# Patient Record
Sex: Female | Born: 1965
Health system: Southern US, Community
[De-identification: ages and names within clinical notes are randomized; demographics above are authoritative.]

## PROBLEM LIST (undated history)

## (undated) DIAGNOSIS — J45909 Unspecified asthma, uncomplicated: Secondary | ICD-10-CM

## (undated) DIAGNOSIS — H9190 Unspecified hearing loss, unspecified ear: Secondary | ICD-10-CM

## (undated) DIAGNOSIS — G4736 Sleep related hypoventilation in conditions classified elsewhere: Secondary | ICD-10-CM

## (undated) DIAGNOSIS — G4730 Sleep apnea, unspecified: Secondary | ICD-10-CM

## (undated) DIAGNOSIS — E785 Hyperlipidemia, unspecified: Secondary | ICD-10-CM

## (undated) DIAGNOSIS — M1990 Unspecified osteoarthritis, unspecified site: Secondary | ICD-10-CM

## (undated) DIAGNOSIS — F341 Dysthymic disorder: Secondary | ICD-10-CM

## (undated) DIAGNOSIS — R350 Frequency of micturition: Secondary | ICD-10-CM

## (undated) DIAGNOSIS — E039 Hypothyroidism, unspecified: Secondary | ICD-10-CM

## (undated) DIAGNOSIS — I509 Heart failure, unspecified: Secondary | ICD-10-CM

## (undated) DIAGNOSIS — D751 Secondary polycythemia: Secondary | ICD-10-CM

## (undated) DIAGNOSIS — F419 Anxiety disorder, unspecified: Secondary | ICD-10-CM

## (undated) DIAGNOSIS — F329 Major depressive disorder, single episode, unspecified: Secondary | ICD-10-CM

## (undated) DIAGNOSIS — E119 Type 2 diabetes mellitus without complications: Secondary | ICD-10-CM

## (undated) DIAGNOSIS — J189 Pneumonia, unspecified organism: Secondary | ICD-10-CM

## (undated) DIAGNOSIS — I10 Essential (primary) hypertension: Secondary | ICD-10-CM

## (undated) DIAGNOSIS — R0600 Dyspnea, unspecified: Secondary | ICD-10-CM

## (undated) DIAGNOSIS — Z0000 Encounter for general adult medical examination without abnormal findings: Secondary | ICD-10-CM

## (undated) DIAGNOSIS — G709 Myoneural disorder, unspecified: Secondary | ICD-10-CM

## (undated) DIAGNOSIS — I872 Venous insufficiency (chronic) (peripheral): Secondary | ICD-10-CM

## (undated) DIAGNOSIS — J449 Chronic obstructive pulmonary disease, unspecified: Secondary | ICD-10-CM

## (undated) DIAGNOSIS — R0902 Hypoxemia: Secondary | ICD-10-CM

## (undated) DIAGNOSIS — G629 Polyneuropathy, unspecified: Secondary | ICD-10-CM

## (undated) HISTORY — DX: Essential (primary) hypertension: I10

## (undated) HISTORY — DX: Sleep related hypoventilation in conditions classified elsewhere: G47.36

## (undated) HISTORY — DX: Hypoxemia: R09.02

## (undated) HISTORY — PX: TONSILLECTOMY: SUR1361

## (undated) HISTORY — DX: Pneumonia, unspecified organism: J18.9

## (undated) HISTORY — DX: Hyperlipidemia, unspecified: E78.5

## (undated) HISTORY — DX: Venous insufficiency (chronic) (peripheral): I87.2

## (undated) HISTORY — DX: Major depressive disorder, single episode, unspecified: F32.9

## (undated) HISTORY — DX: Frequency of micturition: R35.0

## (undated) HISTORY — DX: Encounter for general adult medical examination without abnormal findings: Z00.00

## (undated) HISTORY — DX: Secondary polycythemia: D75.1

## (undated) HISTORY — DX: Chronic obstructive pulmonary disease, unspecified: J44.9

## (undated) HISTORY — DX: Hypothyroidism, unspecified: E03.9

## (undated) HISTORY — DX: Myoneural disorder, unspecified: G70.9

## (undated) HISTORY — DX: Dysthymic disorder: F34.1

---
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
## 2005-04-29 ENCOUNTER — Ambulatory Visit: Payer: Self-pay | Admitting: Internal Medicine

## 2005-09-04 ENCOUNTER — Ambulatory Visit: Payer: Self-pay | Admitting: Internal Medicine

## 2006-06-25 ENCOUNTER — Ambulatory Visit: Payer: Self-pay | Admitting: Internal Medicine

## 2007-07-26 ENCOUNTER — Encounter: Payer: Self-pay | Admitting: Internal Medicine

## 2007-10-21 ENCOUNTER — Encounter: Payer: Self-pay | Admitting: Internal Medicine

## 2007-11-16 ENCOUNTER — Ambulatory Visit: Payer: Self-pay | Admitting: Internal Medicine

## 2007-11-16 LAB — CONVERTED CEMR LAB
Albumin: 3.6 g/dL (ref 3.5–5.2)
BUN: 8 mg/dL (ref 6–23)
Basophils Absolute: 0 10*3/uL (ref 0.0–0.1)
Basophils Relative: 0.2 % (ref 0.0–1.0)
Bilirubin Urine: NEGATIVE
Bilirubin, Direct: 0.1 mg/dL (ref 0.0–0.3)
Creatinine, Ser: 0.6 mg/dL (ref 0.4–1.2)
Eosinophils Absolute: 0.2 10*3/uL (ref 0.0–0.7)
Eosinophils Relative: 1.7 % (ref 0.0–5.0)
HCT: 45.9 % (ref 36.0–46.0)
HDL: 23.7 mg/dL — ABNORMAL LOW (ref >39.0)
Lymphocytes Relative: 16 % (ref 12.0–46.0)
MCV: 90.6 fL (ref 78.0–100.0)
Monocytes Absolute: 0.6 10*3/uL (ref 0.1–1.0)
Monocytes Relative: 5.9 % (ref 3.0–12.0)
Neutrophils Relative %: 76.2 % (ref 43.0–77.0)
Nitrite: NEGATIVE
Platelets: 186 10*3/uL (ref 150–400)
RDW: 12.9 % (ref 11.5–14.6)
TSH: 5.42 microintl units/mL (ref 0.35–5.50)
Total Protein: 7.5 g/dL (ref 6.0–8.3)
Urobilinogen, UA: 1 (ref 0.0–1.0)
pH: 7 (ref 5.0–8.0)

## 2007-11-19 ENCOUNTER — Ambulatory Visit: Payer: Self-pay | Admitting: Internal Medicine

## 2007-11-19 DIAGNOSIS — E785 Hyperlipidemia, unspecified: Secondary | ICD-10-CM

## 2007-11-19 DIAGNOSIS — F32A Depression, unspecified: Secondary | ICD-10-CM | POA: Insufficient documentation

## 2007-11-19 DIAGNOSIS — F411 Generalized anxiety disorder: Secondary | ICD-10-CM | POA: Insufficient documentation

## 2007-11-19 DIAGNOSIS — F3289 Other specified depressive episodes: Secondary | ICD-10-CM

## 2007-11-19 DIAGNOSIS — F329 Major depressive disorder, single episode, unspecified: Secondary | ICD-10-CM

## 2007-11-19 DIAGNOSIS — I10 Essential (primary) hypertension: Secondary | ICD-10-CM | POA: Insufficient documentation

## 2007-11-19 DIAGNOSIS — E039 Hypothyroidism, unspecified: Secondary | ICD-10-CM | POA: Insufficient documentation

## 2007-11-19 HISTORY — DX: Hypothyroidism, unspecified: E03.9

## 2007-11-19 HISTORY — DX: Essential (primary) hypertension: I10

## 2007-11-19 HISTORY — DX: Major depressive disorder, single episode, unspecified: F32.9

## 2007-11-19 HISTORY — DX: Other specified depressive episodes: F32.89

## 2007-11-19 HISTORY — DX: Hyperlipidemia, unspecified: E78.5

## 2008-12-29 ENCOUNTER — Telehealth (INDEPENDENT_AMBULATORY_CARE_PROVIDER_SITE_OTHER): Payer: Self-pay | Admitting: *Deleted

## 2009-01-26 ENCOUNTER — Ambulatory Visit: Payer: Self-pay | Admitting: Internal Medicine

## 2009-01-29 ENCOUNTER — Ambulatory Visit: Payer: Self-pay | Admitting: Internal Medicine

## 2009-01-29 DIAGNOSIS — D751 Secondary polycythemia: Secondary | ICD-10-CM | POA: Insufficient documentation

## 2009-01-29 DIAGNOSIS — F341 Dysthymic disorder: Secondary | ICD-10-CM

## 2009-01-29 HISTORY — DX: Dysthymic disorder: F34.1

## 2009-01-29 HISTORY — DX: Secondary polycythemia: D75.1

## 2009-01-29 LAB — CONVERTED CEMR LAB
ALT: 23 units/L (ref 0–35)
AST: 24 units/L (ref 0–37)
Albumin: 3.8 g/dL (ref 3.5–5.2)
Basophils Absolute: 0 10*3/uL (ref 0.0–0.1)
Bilirubin, Direct: 0.1 mg/dL (ref 0.0–0.3)
CO2: 30 meq/L (ref 19–32)
Creatinine, Ser: 0.5 mg/dL (ref 0.4–1.2)
Ferritin: 124.5 ng/mL (ref 10.0–291.0)
Glucose, Bld: 100 mg/dL — ABNORMAL HIGH (ref 70–99)
HCT: 48.6 % — ABNORMAL HIGH (ref 36.0–46.0)
LDL Cholesterol: 93 mg/dL (ref 0–99)
Lymphocytes Relative: 19.1 % (ref 12.0–46.0)
MCV: 91.5 fL (ref 78.0–100.0)
Monocytes Absolute: 0.7 10*3/uL (ref 0.1–1.0)
Neutro Abs: 5.3 10*3/uL (ref 1.4–7.7)
Potassium: 4.2 meq/L (ref 3.5–5.1)
RBC: 5.31 M/uL — ABNORMAL HIGH (ref 3.87–5.11)
RDW: 12.5 % (ref 11.5–14.6)
Specific Gravity, Urine: 1.025 (ref 1.000–1.030)
TSH: 4.33 microintl units/mL (ref 0.35–5.50)
Total Bilirubin: 0.9 mg/dL (ref 0.3–1.2)
Total CHOL/HDL Ratio: 5
Total Protein: 7.3 g/dL (ref 6.0–8.3)
Triglycerides: 181 mg/dL — ABNORMAL HIGH (ref 0.0–149.0)
Urine Glucose: NEGATIVE mg/dL
pH: 5.5 (ref 5.0–8.0)

## 2009-02-03 ENCOUNTER — Encounter: Payer: Self-pay | Admitting: Internal Medicine

## 2009-02-08 ENCOUNTER — Telehealth: Payer: Self-pay | Admitting: Internal Medicine

## 2009-02-08 DIAGNOSIS — G4736 Sleep related hypoventilation in conditions classified elsewhere: Secondary | ICD-10-CM | POA: Insufficient documentation

## 2009-02-08 HISTORY — DX: Sleep related hypoventilation in conditions classified elsewhere: G47.36

## 2009-02-12 ENCOUNTER — Encounter: Payer: Self-pay | Admitting: Internal Medicine

## 2009-02-12 ENCOUNTER — Encounter (INDEPENDENT_AMBULATORY_CARE_PROVIDER_SITE_OTHER): Payer: Self-pay | Admitting: *Deleted

## 2009-03-01 ENCOUNTER — Ambulatory Visit: Payer: Self-pay | Admitting: Pulmonary Disease

## 2009-08-03 ENCOUNTER — Ambulatory Visit: Payer: Self-pay | Admitting: Internal Medicine

## 2009-08-23 ENCOUNTER — Telehealth: Payer: Self-pay | Admitting: Internal Medicine

## 2009-10-17 ENCOUNTER — Ambulatory Visit: Payer: Self-pay | Admitting: Internal Medicine

## 2009-10-17 DIAGNOSIS — R350 Frequency of micturition: Secondary | ICD-10-CM

## 2009-10-17 DIAGNOSIS — H6690 Otitis media, unspecified, unspecified ear: Secondary | ICD-10-CM | POA: Insufficient documentation

## 2009-10-17 HISTORY — DX: Frequency of micturition: R35.0

## 2009-11-06 ENCOUNTER — Encounter: Payer: Self-pay | Admitting: Internal Medicine

## 2010-02-18 ENCOUNTER — Ambulatory Visit: Payer: Self-pay | Admitting: Internal Medicine

## 2010-02-18 LAB — CONVERTED CEMR LAB
ALT: 19 units/L (ref 0–35)
AST: 19 units/L (ref 0–37)
Alkaline Phosphatase: 55 units/L (ref 39–117)
BUN: 11 mg/dL (ref 6–23)
Basophils Relative: 0.5 % (ref 0.0–3.0)
Bilirubin Urine: NEGATIVE
Bilirubin, Direct: 0.1 mg/dL (ref 0.0–0.3)
CO2: 32 meq/L (ref 19–32)
Calcium: 9.2 mg/dL (ref 8.4–10.5)
Chloride: 101 meq/L (ref 96–112)
Creatinine, Ser: 0.5 mg/dL (ref 0.4–1.2)
Eosinophils Relative: 2.2 % (ref 0.0–5.0)
GFR calc non Af Amer: 136.04 mL/min (ref >60)
HDL: 37.8 mg/dL — ABNORMAL LOW (ref >39.00)
Hemoglobin, Urine: NEGATIVE
Hemoglobin: 16.9 g/dL — ABNORMAL HIGH (ref 12.0–15.0)
Leukocytes, UA: NEGATIVE
Monocytes Absolute: 0.8 10*3/uL (ref 0.1–1.0)
Monocytes Relative: 9 % (ref 3.0–12.0)
Neutro Abs: 5.7 10*3/uL (ref 1.4–7.7)
Neutrophils Relative %: 67.7 % (ref 43.0–77.0)
Nitrite: NEGATIVE
RDW: 13.2 % (ref 11.5–14.6)
Sodium: 141 meq/L (ref 135–145)
Specific Gravity, Urine: >=1.03 (ref 1.000–1.030)
TSH: 11.07 microintl units/mL — ABNORMAL HIGH (ref 0.35–5.50)
Total CHOL/HDL Ratio: 4
Urine Glucose: NEGATIVE mg/dL
Urobilinogen, UA: 0.2 (ref 0.0–1.0)
pH: 6 (ref 5.0–8.0)

## 2010-02-19 ENCOUNTER — Encounter: Admission: RE | Admit: 2010-02-19 | Discharge: 2010-02-19 | Payer: Self-pay | Admitting: Internal Medicine

## 2010-02-20 ENCOUNTER — Encounter: Payer: Self-pay | Admitting: Internal Medicine

## 2010-02-20 ENCOUNTER — Ambulatory Visit: Payer: Self-pay | Admitting: Internal Medicine

## 2010-03-14 ENCOUNTER — Ambulatory Visit (HOSPITAL_BASED_OUTPATIENT_CLINIC_OR_DEPARTMENT_OTHER): Admission: RE | Admit: 2010-03-14 | Discharge: 2010-03-14 | Payer: Self-pay | Admitting: Pulmonary Disease

## 2010-03-14 ENCOUNTER — Encounter: Payer: Self-pay | Admitting: Pulmonary Disease

## 2010-03-29 ENCOUNTER — Ambulatory Visit: Payer: Self-pay | Admitting: Pulmonary Disease

## 2010-03-29 ENCOUNTER — Telehealth (INDEPENDENT_AMBULATORY_CARE_PROVIDER_SITE_OTHER): Payer: Self-pay | Admitting: *Deleted

## 2010-04-01 ENCOUNTER — Encounter: Payer: Self-pay | Admitting: Pulmonary Disease

## 2010-04-30 LAB — HM COLONOSCOPY: HM Colonoscopy: NORMAL

## 2010-05-02 ENCOUNTER — Ambulatory Visit: Payer: Self-pay | Admitting: Internal Medicine

## 2010-05-02 ENCOUNTER — Ambulatory Visit: Payer: Self-pay | Admitting: Pulmonary Disease

## 2010-05-03 LAB — CONVERTED CEMR LAB: TSH: 6.16 microintl units/mL — ABNORMAL HIGH (ref 0.35–5.50)

## 2010-05-22 ENCOUNTER — Ambulatory Visit: Payer: Self-pay | Admitting: Pulmonary Disease

## 2010-05-27 ENCOUNTER — Encounter: Payer: Self-pay | Admitting: Pulmonary Disease

## 2010-05-31 ENCOUNTER — Ambulatory Visit: Payer: Self-pay | Admitting: Internal Medicine

## 2010-07-30 NOTE — Letter (Signed)
Summary: [facility]  [address]   [place], [place] [address]   Phone: [phone]  Fax: [phone]    04/01/2010  Cartha Salvo [address] [place], [place]  [address]  Dear Ms. Hammers,   We have tried to contact you at your home phone number and we have been unable to reach you. Will you please contact our office at [phone]. Our office hours are 8:00 a.m. to 5:30 p.m. Monday through Friday.         Sincerely,   [employer]

## 2010-07-30 NOTE — Letter (Signed)
Summary: [facility]  [address]   [place], [place] [address]   Phone: [phone]  Fax: [phone]    05/27/2010  Autumn Massey [address] [place], [place]  [address]  Dear [name],  We have attempted to contact you at your home phone number and have been unable to reach you. If you would please give our office a call back as soon as possible. Our phone number is [phone] and we are open Monday through Friday 8:00 a.m. to 5:30 p.m.         Sincerely,   [employer]

## 2010-07-30 NOTE — Assessment & Plan Note (Signed)
Summary: PHYSICAL---STC   Vital Signs:  Patient profile:   45 year old female Height:      62 inches Weight:      330.50 pounds BMI:     60.67 O2 Sat:      94 % on Room air Temp:     98.5 degrees F oral Pulse rate:   72 / minute BP sitting:   132 / 100  (left arm) Cuff size:   large  Vitals Entered By: Zella Ball Ewing CMA Duncan Dull) (February 20, 2010 11:25 AM)  O2 Flow:  Room air  Preventive Care Screening  Mammogram:    Date:  02/19/2010    Results:  normal   Pap Smear:    Date:  02/28/2009    Results:  normal      last dental exam july 2011, eye exam Feb 16, 2010, next pap for sept 8, 2011, and sleep study sched' d for sept 15  CC: Adult Physical/RE   Primary Care Provider:  Corwin Levins MD  CC:  Adult Physical/RE.  History of Present Illness: herer to f/u - lost 4 lbs with walking at work, then regained it back;     Pt denies CP, worsening sob, doe, wheezing, orthopnea, pnd, worsening LE edema, palps, dizziness or syncope  Pt denies new neuro symptoms such as headache, facial or extremity weakness  No fever, wt loss, night sweats, loss of appetite or other constitutional symptoms Denies polydipsia, or polyuria.  , or hyper or hypothyroid symtpoms such as voice/skin changes  Problems Prior to Update: 1)  Frequency, Urinary  (ICD-788.41) 2)  Otitis Media, Acute, Left  (ICD-382.9) 3)  Sleep Related Hypoventilation/hypoxemia Cce  (ICD-327.26) 4)  Anxiety Depression  (ICD-300.4) 5)  Polycythemia  (ICD-289.0) 6)  Preventive Health Care  (ICD-V70.0) 7)  Anxiety  (ICD-300.00) 8)  Depression  (ICD-311) 9)  Hypothyroidism  (ICD-244.9) 10)  Hypertension  (ICD-401.9) 11)  Hyperlipidemia  (ICD-272.4)  Medications Prior to Update: 1)  Furosemide 40 Mg  Tabs (Furosemide) .[name].. 1po Once Daily 2)  Levothyroxine Sodium 137 Mcg Tabs (Levothyroxine Sodium) .... Take 1 Tablet By Mouth Once A  Day 3)  Lisinopril 40 Mg Tabs (Lisinopril) .... Take 1 Tablet By Mouth Once A Day 4)  Toprol Xl  100 Mg  Tb24 (Metoprolol Succinate) .[name].. 1 By Mouth Once Daily 5)  Simvastatin 40 Mg Tabs (Simvastatin) .[name].. 1 By Mouth Once Daily 6)  Adult Aspirin Ec Low Strength 81 Mg  Tbec (Aspirin) .[name].. 1po Qd 7)  Fluoxetine Hcl 20 Mg Caps (Fluoxetine Hcl) .[name].. 1po Once Daily 8)  Cephalexin 500 Mg Caps (Cephalexin) .[name].. 1 By Mouth Three Times A Day  Current Medications (verified): 1)  Furosemide 40 Mg  Tabs (Furosemide) .[name].. 1po Once Daily 2)  Levothyroxine Sodium 150 Mcg Tabs (Levothyroxine Sodium) .[name].. 1 By Mouth Once Daily 3)  Lisinopril 40 Mg Tabs (Lisinopril) .... Take 1 Tablet By Mouth Once A Day 4)  Metoprolol Tartrate 50 Mg Tabs (Metoprolol Tartrate) .[name].. 1 By Mouth Two Times A Day 5)  Simvastatin 40 Mg Tabs (Simvastatin) .[name].. 1 By Mouth Once Daily 6)  Adult Aspirin Ec Low Strength 81 Mg  Tbec (Aspirin) .[name].. 1po Qd 7)  Fluoxetine Hcl 20 Mg Caps (Fluoxetine Hcl) .[name].. 1po Once Daily  Allergies (verified): 1)  ! 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  less than 1 ppd.    Risk Factors: Alcohol Use: 0 (08/03/2009)  Risk Factors: Smoking Status: current (08/03/2009) Packs/Day: 0.75 (08/03/2009)  Review of Systems  The patient denies anorexia, fever, weight loss, weight gain,  vision loss, decreased hearing, hoarseness, chest pain, syncope, dyspnea on exertion, peripheral edema, prolonged cough, headaches, hemoptysis, abdominal pain, melena, hematochezia, severe indigestion/heartburn, hematuria, muscle weakness, suspicious skin lesions, transient blindness, difficulty walking, unusual weight change, abnormal bleeding, enlarged lymph nodes, and angioedema.         all otherwise negative per pt -  except for an episode of vertigo assoc with left ear ache 1 wks ago now resolved  Physical Exam  General:  alert and overweight-appearing.   Head:  normocephalic and atraumatic.   Eyes:  vision grossly intact, pupils equal, and pupils round.   Ears:  R ear normal and L ear normal.   Nose:  no external deformity and no nasal discharge.   Mouth:  no gingival abnormalities and pharynx pink and moist.   Neck:  supple and no masses.   Lungs:  normal respiratory effort and normal breath sounds.   Heart:  normal rate and regular rhythm.   Abdomen:  soft, non-tender, and normal bowel sounds.   Msk:  no joint tenderness and no joint swelling.   Extremities:  no edema, no erythema  Neurologic:  cranial nerves II-XII intact and strength normal in all extremities.   Skin:  color normal and no rashes.   Psych:  slightly anxious.     Impression & Recommendations:  Problem # 1:  PREVENTIVE HEALTH CARE (ICD-V70.0) Overall doing well, age appropriate education and counseling updated and referral for appropriate preventive services done unless declined, immunizations up to date or declined, diet counseling done if overweight, urged to quit smoking if smokes , most recent labs reviewed and current ordered if appropriate, ecg reviewed or declined (interpretation per ECG scanned in the EMR if done); information regarding Medicare Prevention requirements given if appropriate; speciality referrals updated as appropriate   Problem # 2:  HYPOTHYROIDISM (ICD-244.9)  Her updated medication list  for this problem includes:    Levothyroxine Sodium 150 Mcg Tabs (Levothyroxine sodium) .[name]... 1 by mouth once daily to incr the thyroid med, f/u lab 4 wks  Problem # 3:  HYPERLIPIDEMIA (ICD-272.4)  Her updated medication list for this problem includes:    Simvastatin 40 Mg Tabs (Simvastatin) .[name]... 1 by mouth once daily  Labs Reviewed: SGOT: 19 (02/18/2010)   SGPT: 19 (02/18/2010)  Prior 10 Yr Risk Heart Disease: 8 % (08/03/2009)   HDL:37.80 (02/18/2010), 30.90 (01/26/2009)  LDL:94 (02/18/2010), 93 (01/26/2009)  Chol:167 (02/18/2010), 160 (01/26/2009)  Trig:174.0 (02/18/2010), 181.0 (01/26/2009) stable overall by hx and exam, ok to continue meds/tx as is   Complete Medication List: 1)  Furosemide 40 Mg Tabs (Furosemide) .[name].. 1po once daily 2)  Levothyroxine Sodium 150 Mcg Tabs (Levothyroxine sodium) .[name].. 1 by mouth once daily 3)  Lisinopril 40 Mg Tabs (Lisinopril) .... Take 1 tablet by mouth once a day 4)  Metoprolol Tartrate 50 Mg Tabs (Metoprolol tartrate) .[name].. 1 by mouth two  times a day 5)  Simvastatin 40 Mg Tabs (Simvastatin) .[name].. 1 by mouth once daily 6)  Adult Aspirin Ec Low Strength 81 Mg Tbec (Aspirin) .[name].. 1po qd 7)  Fluoxetine Hcl 20 Mg Caps (Fluoxetine hcl) .[name].. 1po once daily  Other Orders: EKG w/ Interpretation ([address]) Admin 1st Vaccine ([address]) Flu Vaccine 44yrs + ([address])  Patient Instructions: 1)  you had the flu shot today 2)  increase the thyroid med to 150 micrograms per day 3)  please return in 4 wks fo rLABonly:  TSH  244.8 4)  Continue all previous medications as before this visit  5)  all of your medications were sent to [facility] , except for the simvastatin that you can get less expensive at [facility] 6)  Please keep your appts such as the sleep study as you have planned 7)  Please schedule a follow-up appointment in 6 months with: 8)  BMP prior to visit, ICD-9: 401.1 9)  Lipid Panel prior to visit, ICD-9:272.0 Prescriptions: LISINOPRIL 40 MG TABS  (LISINOPRIL) Take 1 tablet by mouth once a day  #90 x 3   Entered and Authorized by:   Corwin Levins MD   Signed by:   Corwin Levins MD on 02/20/2010   Method used:   Electronically to        [facility] (retail)       [address]       [place], [place]  [address]       Ph: [phone]       Fax: [phone]   RxID:   [id number] METOPROLOL TARTRATE 50 MG TABS (METOPROLOL TARTRATE) 1 by mouth two times a day  #180 x 3   Entered and Authorized by:   Corwin Levins MD   Signed by:   Corwin Levins MD on 02/20/2010   Method used:   Electronically to        [facility] (retail)       [address]       [place], [place]  [address]       Ph: [phone]       Fax: [phone]   RxID:   [id number] SIMVASTATIN 40 MG TABS (SIMVASTATIN) 1 by mouth once daily  #90 x 3   Entered and Authorized by:   Corwin Levins MD   Signed by:   Corwin Levins MD on 02/20/2010   Method used:   Print then Give to Patient   RxID:   [id number] FLUOXETINE HCL 20 MG CAPS (FLUOXETINE HCL) 1po once daily  #90 x 3   Entered and Authorized by:   Corwin Levins MD   Signed by:   Corwin Levins MD on 02/20/2010   Method used:   Electronically to        [facility] (retail)       [address]       [place], [place]  [address]       Ph: [phone]       Fax: [phone]   RxID:   [id number] FUROSEMIDE 40 MG  TABS (FUROSEMIDE) 1po once daily  #90 x 3   Entered and Authorized by:   Corwin Levins MD   Signed by:   Corwin Levins MD on 02/20/2010   Method used:  Electronically to        [facility] (retail)       [address]       [place], [place]  [address]       Ph: [phone]       Fax: [phone]   RxID:   [id number] LEVOTHYROXINE SODIUM 150 MCG TABS (LEVOTHYROXINE SODIUM) 1 by mouth once daily  #90 x 3   Entered and Authorized by:   Corwin Levins MD   Signed by:    Corwin Levins MD on 02/20/2010   Method used:   Electronically to        [facility] (retail)       [address]       [place], [place]  [address]       Ph: [phone]       Fax: [phone]   RxID:   [id number]     Flu Vaccine Consent Questions     Do you have a history of severe allergic reactions to this vaccine? no    Any prior history of allergic reactions to egg and/or gelatin? no    Do you have a sensitivity to the preservative Thimersol? no    Do you have a past history of Guillan-Barre Syndrome? no    Do you currently have an acute febrile illness? no    Have you ever had a severe reaction to latex? no    Vaccine information given and explained to patient? yes    Are you currently pregnant? no    Lot Number:[id number]   Exp Date:12/28/2010   Site Given  Left Deltoid IMlu

## 2010-07-30 NOTE — Letter (Signed)
Summary: Primary Care Appointment Letter  [facility]  [address] [place], [place] [address]   Phone: [phone]  Fax: [phone]    11/06/2009 MRN: [id number]  Autumn Massey [address] [place], [place]  [address]  Dear [name],   Your Primary Care Physician Corwin Levins MD has indicated that:    ___X____it is time to schedule an appointment with Dr. Jonny Ruiz in July with physical labs included. Please call the office.    _______you missed your appointment on______ and need to call and          reschedule.    _______you need to have lab work done.    _______you need to schedule an appointment discuss lab or test results.    _______you need to call to reschedule your appointment that is                       scheduled on _________.     Please call our office as soon as possible. Our phone number is [phone]. Please press option 1. Our office is open 8a-12noon and 1p-5p, Monday through Friday.     Thank you,    [facility]

## 2010-07-30 NOTE — Miscellaneous (Signed)
Summary: Orders Update pft charges  Clinical Lists Changes  Orders: Added new Service order of Carbon Monoxide diffusing w/capacity (94720) - Signed Added new Service order of Lung Volumes (94240) - Signed Added new Service order of Spirometry (Pre & Post) ([address]60) - Signed 

## 2010-07-30 NOTE — Assessment & Plan Note (Signed)
Summary: rov for review of sleep study   Visit Type:  Follow-up Copy to:  Oliver Barre Primary Provider/Referring Provider:  Corwin Levins MD  CC:  pt here to discuss sleep study results. .  History of Present Illness: the pt comes in today for review of her recent sleep study, ordered as part of a w/u for nocturnal desaturation.  She was found to have very mild osa, with AHI of 7/hr and desat as low as 76%.  I have reviewed the study in detail with the pt, and answered all of her questions.  Current Medications (verified): 1)  Furosemide 40 Mg  Tabs (Furosemide) .[name].. 1po Once Daily 2)  Levothyroxine Sodium 150 Mcg Tabs (Levothyroxine Sodium) .[name].. 1 By Mouth Once Daily 3)  Lisinopril 20 Mg Tabs (Lisinopril) .... One Tablet Two Times A Day 4)  Metoprolol Tartrate 50 Mg Tabs (Metoprolol Tartrate) .[name].. 1 By Mouth Two Times A Day 5)  Simvastatin 40 Mg Tabs (Simvastatin) .[name].. 1 By Mouth Once Daily 6)  Adult Aspirin Ec Low Strength 81 Mg  Tbec (Aspirin) .[name].. 1po Qd 7)  Fluoxetine Hcl 20 Mg Caps (Fluoxetine Hcl) .[name].. 1po Once Daily  Allergies (verified): No Known Drug Allergies  Review of Systems       The patient complains of nasal congestion/difficulty breathing through nose and joint stiffness or pain.  The patient denies shortness of breath with activity, shortness of breath at rest, productive cough, non-productive cough, coughing up blood, chest pain, irregular heartbeats, acid heartburn, indigestion, loss of appetite, weight change, abdominal pain, difficulty swallowing, sore throat, tooth/dental problems, headaches, sneezing, itching, ear ache, anxiety, depression, hand/feet swelling, rash, change in color of mucus, and fever.    Vital Signs:  Patient profile:   45 year old female Height:      62 inches Weight:      312 pounds BMI:     57.27 O2 Sat:      94 % on Room air Temp:     98.6 degrees F oral Pulse rate:   60 / minute BP sitting:   150 / 98  (left arm) Cuff size:    large  Vitals Entered By: Carver Fila (May 02, 2010 9:22 AM)  O2 Flow:  Room air CC: pt here to discuss sleep study results.  Comments meds and allergies updated Phone number updated Carver Fila  May 02, 2010 9:23 AM    Physical Exam  General:  morbidly obese female in nad Extremities:  mild edema, no cyanosis  Neurologic:  alert, not sleepy,moves all 4.   Impression & Recommendations:  Problem # 1:  SLEEP RELATED HYPOVENTILATION/HYPOXEMIA CCE (ICD-327.26) the pt has minimal osa by her most recent sleep study, but does have desaturation to 76%.  I suspect this is due to her morbid obesity with nocturnal hypoaeration, however I cannot r/o the possibility of obstructive airways disease given her history of smoking.  Will set up for pfts for evaluation, and will also start her on nocturnal oxygen until she can lose weight and stop smoking.  I have recommended a trial of chantix, and she is agreeable.  Regarding her very mild osa, she is not overly symptomatic in the am's or during the day, and therefore would not recommend cpap or other treatment options for sleep disorded breathing.  Medications Added to Medication List This Visit: 1)  Lisinopril 20 Mg Tabs (Lisinopril) .... One tablet two times a day 2)  Chantix Starting Month Pak 0.5 Mg  X 11 & 1 Mg X 42 Misc (Varenicline tartrate) .... Take as directed---refills are to be for the continuing pak  Other Orders: Est. Patient Level III (98119) DME Referral (DME) Pulmonary Referral (Pulmonary) TLB-TSH (Thyroid Stimulating Hormone) (84443-TSH)  Patient Instructions: 1)  continue to work on weight loss 2)  will start on oxygen at night only while sleeping 3)  will schedule for breathing tests, and I will let you know the results. 4)  stop smoking 5)  will try chantix starter pak....as directed.  Prescriptions: CHANTIX STARTING MONTH PAK 0.5 MG X 11 & 1 MG X 42  MISC (VARENICLINE TARTRATE) Take as directed---Refills are to  be for the continuing pak  #1 x 0   Entered and Authorized by:   Barbaraann Share MD   Signed by:   Barbaraann Share MD on 05/02/2010   Method used:   Print then Give to Patient   RxID:   [id number]     Appended Document: rov for review of sleep study pfts show no obstruction, mild restriction, normal dlco.  I suspect this is due to obesity  mindy, please let pt know she does not have copd, but still needs to quit smoking.  The tests do show mild restriction related to her weight.  she needs to stay on oxygen, stop smoking, work on weight loss, and see me back in67mos.  Appended Document: rov for review of sleep study atc pt and was unable to reach her due to her phone number being disconnected will send a letter out to her home for her to give [name] a call back  Appended Document: rov for review of sleep study Pt aware and verbalized understanding, not able to schedule appointment at this time due to work schedule but will call back after the first of the year.

## 2010-07-30 NOTE — Progress Notes (Signed)
Summary: pt needs ov to discuss sleep study results.   Phone Note Outgoing Call   Call placed by: Carver Fila,  March 29, 2010 9:23 AM Call placed to: Patient Summary of Call: lmomtcb x1. Pt needs ov to discuss sleep study results.  Carver Fila  March 29, 2010 9:24 AM   Follow-up for Phone Call        lmomtcb x2 Carver Fila  March 29, 2010 4:11 PM    atc pt x3. It states the number you have dialed is not in service. Will send letter to pt's home stating for her to contact our office to schedule an ov with kc. When i tried to call pt at work they don't have anyone that works their by that name.  Carver Fila  April 01, 2010 4:46 PM     Additional Follow-up for Phone Call Additional follow up Details #2::    pt is scheduled to come in on 11/3/ at 9:30 a.m. pt is aware Carver Fila  April 05, 2010 4:45 PM

## 2010-07-30 NOTE — Progress Notes (Signed)
  Phone Note Call from Patient   Summary of Call: Pt called stating she was putting her pants on yesterday and something popped in her right hip and shot a pain down her right leg to her knee. Pt states she has no pain below right knee. Pt wants to know if it a pinched nerve how long will the pain last? She doesn't want to come in if she doesn't have to but she can't hardly handle the pain. Please advise? Pt states she called yesterday but I don't see any notes on it? Initial call taken by: Josph Macho RMA,  August 23, 2009 1:08 PM  Follow-up for Phone Call        more likely tendon or muscle pain as she describes it; ok for tylenol ;  if tendon, muslce or even nerve pain can take several days to a few wks if severe to improve Follow-up by: Corwin Levins MD,  August 23, 2009 1:29 PM  Additional Follow-up for Phone Call Additional follow up Details #1::        Called to notify patient, no answer and no machine. Additional Follow-up by: Lucious Groves,  August 23, 2009 2:35 PM    Additional Follow-up for Phone Call Additional follow up Details #2::    Left message for pt to return my call. Follow-up by: Josph Macho RMA,  August 23, 2009 4:41 PM  Additional Follow-up for Phone Call Additional follow up Details #3:: Details for Additional Follow-up Action Taken: Pt informed via VM, told to call back wth any further concerns. Additional Follow-up by: Margaret Pyle, CMA,  August 24, 2009 9:10 AM

## 2010-07-30 NOTE — Assessment & Plan Note (Signed)
Summary: BP IS HIGH/ UTI /EAR PAIN /NWS   Vital Signs:  Patient profile:   45 year old female Height:      62 inches Weight:      326.25 pounds BMI:     59.89 O2 Sat:      94 % on Room air Temp:     98.6 degrees F oral Pulse rate:   77 / minute BP sitting:   130 / 88  (left arm) Cuff size:   large  Vitals Entered ByMarland Kitchen Zella Ball Ewing (October 17, 2009 4:46 PM)  O2 Flow:  Room air CC: Blood Pressure elevated, Left ear pain/RE   Primary Care Provider:  Corwin Levins MD  CC:  Blood Pressure elevated and Left ear pain/RE.  History of Present Illness: here with acute onset x 2 to 3 days left earache moderate, with mild fever and headache, but no right earache, sinus pain or pressure, fever , vertigo or dizziness, ST and Pt denies CP, sob, doe, wheezing, orthopnea, pnd, worsening LE edema, palps, dizziness or syncope Has also developed mild urinary frequency in the past few days as well, but ? due to increased by mouth fluid intake;  denies back pain, n/v, chills or hematuria. Pt denies new neuro symptoms such as headache, facial or extremity weakness.  Denies polydipsia, polyruia , no hx of DM   Problems Prior to Update: 1)  Frequency, Urinary  (ICD-788.41) 2)  Otitis Media, Acute, Left  (ICD-382.9) 3)  Sleep Related Hypoventilation/hypoxemia Cce  (ICD-327.26) 4)  Anxiety Depression  (ICD-300.4) 5)  Polycythemia  (ICD-289.0) 6)  Preventive Health Care  (ICD-V70.0) 7)  Anxiety  (ICD-300.00) 8)  Depression  (ICD-311) 9)  Hypothyroidism  (ICD-244.9) 10)  Hypertension  (ICD-401.9) 11)  Hyperlipidemia  (ICD-272.4)  Medications Prior to Update: 1)  Furosemide 40 Mg  Tabs (Furosemide) .[name].. 1po Once Daily 2)  Levothyroxine Sodium 137 Mcg Tabs (Levothyroxine Sodium) .... Take 1 Tablet By Mouth Once A  Day 3)  Lisinopril 40 Mg Tabs (Lisinopril) .... Take 1 Tablet By Mouth Once A Day 4)  Toprol Xl 100 Mg  Tb24 (Metoprolol Succinate) .[name].. 1 By Mouth Once Daily 5)  Simvastatin 40 Mg Tabs  (Simvastatin) .[name].. 1 By Mouth Once Daily 6)  Adult Aspirin Ec Low Strength 81 Mg  Tbec (Aspirin) .[name].. 1po Qd 7)  Fluoxetine Hcl 20 Mg Caps (Fluoxetine Hcl) .[name].. 1po Once Daily 8)  Avelox 400 Mg Tabs (Moxifloxacin Hcl) .... One By Mouth Once Daily For 5 Days 9)  Tussionex Pennkinetic Er 8-10 Mg/53ml Lqcr (Chlorpheniramine-Hydrocodone) .... 5 Ml By Mouth Two Times A Day As Needed For Cough  Current Medications (verified): 1)  Furosemide 40 Mg  Tabs (Furosemide) .[name].. 1po Once Daily 2)  Levothyroxine Sodium 137 Mcg Tabs (Levothyroxine Sodium) .... Take 1 Tablet By Mouth Once A  Day 3)  Lisinopril 40 Mg Tabs (Lisinopril) .... Take 1 Tablet By Mouth Once A Day 4)  Toprol Xl 100 Mg  Tb24 (Metoprolol Succinate) .[name].. 1 By Mouth Once Daily 5)  Simvastatin 40 Mg Tabs (Simvastatin) .[name].. 1 By Mouth Once Daily 6)  Adult Aspirin Ec Low Strength 81 Mg  Tbec (Aspirin) .[name].. 1po Qd 7)  Fluoxetine Hcl 20 Mg Caps (Fluoxetine Hcl) .[name].. 1po Once Daily 8)  Cephalexin 500 Mg Caps (Cephalexin) .[name].. 1 By Mouth Three Times A Day  Allergies (verified): 1)  ! 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  less than 1 ppd.    Risk Factors: Alcohol Use: 0 (08/03/2009)  Risk Factors: Smoking Status: current (08/03/2009) Packs/Day: 0.75 (08/03/2009)  Review of Systems       all otherwise negative per pt -    Physical Exam  General:  alert  and overweight-appearing.   Head:  normocephalic and atraumatic.   Eyes:  vision grossly intact, pupils equal, and pupils round.   Ears:  left TM severe erythema, mild bulging,  right tm and canals ok, sinus nontender Nose:  no external deformity and no nasal discharge.   Mouth:  no gingival abnormalities and pharynx pink and moist.   Neck:  supple and no masses.   Lungs:  normal respiratory effort and normal breath sounds.   Heart:  normal rate and regular rhythm.   Abdomen:  soft, non-tender, and normal bowel sounds.   Msk:  no flank tender Extremities:  no edema, no erythema  Neurologic:  alert & oriented X3 and gait normal.     Impression & Recommendations:  Problem # 1:  OTITIS MEDIA, ACUTE, LEFT (ICD-382.9)  The following medications were removed from the medication list:    Avelox 400 Mg Tabs (Moxifloxacin hcl) ..... One by mouth once daily for 5 days Her updated medication list for this problem includes:    Adult Aspirin Ec Low Strength 81 Mg Tbec (Aspirin) .[name]... 1po qd    Cephalexin 500 Mg Caps (Cephalexin) .[name]... 1 by mouth three times a day treat as above, f/u any worsening signs or symptoms   Problem # 2:  FREQUENCY, URINARY (ICD-788.41) ? uti - cant give urine specimen now, treat as above, f/u any worsening signs or symptoms , exam benign today except for ear  Problem # 3:  HYPERTENSION (ICD-401.9)  Her updated medication list for this problem includes:    Furosemide 40 Mg Tabs (Furosemide) .[name]... 1po once daily    Lisinopril 40 Mg Tabs (Lisinopril) .[name]... Take 1 tablet by mouth once a day    Toprol Xl 100 Mg Tb24 (Metoprolol succinate) .[name]... 1 by mouth once daily  BP today: 130/88 Prior BP: 144/90 (08/03/2009)  Prior 10 Yr Risk Heart Disease: 8 % (08/03/2009)  Labs Reviewed: K+: 4.2 (01/26/2009) Creat: : 0.5 (01/26/2009)   Chol: 160 (01/26/2009)   HDL: 30.90 (01/26/2009)   LDL: 93 (01/26/2009)   TG: 181.0 (01/26/2009) improved, stable overall by hx and exam, ok  to continue meds/tx as is   Complete Medication List: 1)  Furosemide 40 Mg Tabs (Furosemide) .[name].. 1po once daily 2)  Levothyroxine Sodium 137 Mcg Tabs (Levothyroxine sodium) .... Take 1 tablet by mouth once a  day 3)  Lisinopril 40 Mg Tabs (Lisinopril) .... Take 1 tablet by mouth once a day 4)  Toprol Xl 100 Mg Tb24 (Metoprolol succinate) .[name].. 1 by mouth once daily 5)  Simvastatin 40 Mg Tabs (Simvastatin) .[name].. 1 by mouth once daily 6)  Adult Aspirin Ec Low Strength 81 Mg Tbec (Aspirin) .[name].. 1po qd 7)  Fluoxetine Hcl 20 Mg Caps (Fluoxetine hcl) .[name].. 1po once daily 8)  Cephalexin 500 Mg Caps (Cephalexin) .[name].. 1 by mouth three times  a day  Patient Instructions: 1)  Please take all new medications as prescribed 2)  Continue all previous medications as before this visit  3)  Please continue to monitor your Blood Pressure as you do, adn return for persistent > 140/90 4)  Please schedule a follow-up appointment in 3 months with CPX labs (the office will call) Prescriptions: CEPHALEXIN 500 MG CAPS (CEPHALEXIN) 1 by mouth three times a day  #30 x 0   Entered and Authorized by:   Corwin Levins MD   Signed by:   Corwin Levins MD on 10/17/2009   Method used:   Print then Give to Patient   RxID:   [id number]

## 2010-07-30 NOTE — Assessment & Plan Note (Signed)
Summary: DR Sammuel Cooper PT-CHEST CONGESTION-COUGH--LITTLE WHEEZIE--STC   Vital Signs:  Patient profile:   45 year old female Height:      62 inches Weight:      322 pounds O2 Sat:      92 % on Room air Temp:     98.6 degrees F oral Pulse rate:   80 / minute Pulse rhythm:   regular Resp:     16 per minute BP sitting:   144 / 90  (left arm) Cuff size:   large  Vitals Entered By: Rock Nephew CMA (August 03, 2009 8:46 AM)  O2 Flow:  Room air  Primary Care Provider:  Corwin Levins MD  CC:  URI symptoms.  History of Present Illness:  URI Symptoms      This is a 45 year old woman who presents with URI symptoms.  The symptoms began 3 days ago.  The severity is described as mild.  The patient reports sore throat and productive cough, but denies nasal congestion, clear nasal discharge, purulent nasal discharge, earache, and sick contacts.  Associated symptoms include fever of 100.5-103 degrees, use of an antipyretic, and response to antipyretic.  The patient denies stiff neck, dyspnea, wheezing, rash, vomiting, and diarrhea.  The patient also reports muscle aches.  The patient denies headache and severe fatigue.  The patient denies the following risk factors for Strep sinusitis: double sickening, tender adenopathy, and absence of cough.    Hypertension History:      She denies headache, chest pain, palpitations, dyspnea with exertion, orthopnea, peripheral edema, visual symptoms, neurologic problems, syncope, and side effects from treatment.  She notes no problems with any antihypertensive medication side effects.        Positive major cardiovascular risk factors include hyperlipidemia, hypertension, and current tobacco user.  Negative major cardiovascular risk factors include female age less than 78 years old, no history of diabetes, and negative family history for ischemic heart disease.        Further assessment for target organ damage reveals no history of ASHD, cardiac end-organ damage  (CHF/LVH), stroke/TIA, peripheral vascular disease, renal insufficiency, or hypertensive retinopathy.     Preventive Screening-Counseling & Management  Alcohol-Tobacco     Alcohol drinks/day: 0     Smoking Status: current     Smoking Cessation Counseling: yes     Smoke Cessation Stage: precontemplative     Packs/Day: 0.75  Hep-HIV-STD-Contraception     Hepatitis Risk: no risk noted     HIV Risk: no risk noted     STD Risk: no risk noted  Current Medications (verified): 1)  Furosemide 40 Mg  Tabs (Furosemide) .[name].. 1po Once Daily 2)  Levothyroxine Sodium 137 Mcg Tabs (Levothyroxine Sodium) .... Take 1 Tablet By Mouth Once A  Day 3)  Lisinopril 40 Mg Tabs (Lisinopril) .... Take 1 Tablet By Mouth Once A Day 4)  Toprol Xl 100 Mg  Tb24 (Metoprolol Succinate) .[name].. 1 By Mouth Once Daily 5)  Simvastatin 40 Mg Tabs (Simvastatin) .[name].. 1 By Mouth Once Daily 6)  Adult Aspirin Ec Low Strength 81 Mg  Tbec (Aspirin) .[name].. 1po Qd 7)  Fluoxetine Hcl 20 Mg Caps (Fluoxetine Hcl) .[name].. 1po Once Daily 8)  Avelox 400 Mg Tabs (Moxifloxacin Hcl) .... One By Mouth Once Daily For 5 Days 9)  Tussionex Pennkinetic Er 8-10 Mg/14ml Lqcr (Chlorpheniramine-Hydrocodone) .... 5 Ml By Mouth Two Times A Day As Needed For Cough  Allergies (verified): 1)  ! Codeine  Past History:  Past Medical History: Reviewed history from 03/01/2009 and no changes required.  morbid obesity left hearing loss - with hearing aid SLEEP RELATED HYPOVENTILATION/HYPOXEMIA CCE (ICD-327.26) ANXIETY DEPRESSION (ICD-300.4) POLYCYTHEMIA (ICD-289.0) ANXIETY (ICD-300.00) DEPRESSION (ICD-311) HYPOTHYROIDISM (ICD-244.9) HYPERTENSION (ICD-401.9) HYPERLIPIDEMIA (ICD-272.4)    Past Surgical History: Reviewed history from 11/19/2007 and no changes required. Tonsillectomy  Family History: Reviewed history from 03/01/2009 and no changes required. alcoholism arthritis HTN  cancer: mother (melanoma) materanl grandfather (prostate)    Social History: Reviewed history from 03/01/2009 and no changes required. 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  less than 1 ppd.  Packs/Day:  0.75 Hepatitis Risk:  no risk noted HIV Risk:  no risk noted STD Risk:  no risk noted  Review of Systems       The patient complains of fever.  The patient denies anorexia, chest pain, peripheral edema, headaches, hemoptysis, abdominal pain, hematuria, suspicious skin lesions, and enlarged lymph nodes.    Physical Exam  General:  alert and overweight-appearing.   Head:  normocephalic and atraumatic.   Ears:  R ear normal and L ear normal.   Nose:  no external deformity and no nasal discharge.   Mouth:  no gingival abnormalities and pharynx pink and moist.   Neck:  supple and no masses.   Lungs:  normal respiratory effort, no accessory muscle use, normal breath sounds, no dullness, no fremitus, no crackles, R wheezes:L wheezes that are faint and late expiratory. Heart:  normal rate, regular rhythm, no murmur, no gallop, and no rub.   Abdomen:  soft, non-tender, normal bowel sounds, no distention, no masses, no guarding, no rigidity, no rebound tenderness, no hepatomegaly, and no splenomegaly.   Msk:  No deformity or scoliosis noted of thoracic or lumbar spine.   Pulses:  R and L carotid,radial,femoral,dorsalis pedis and posterior tibial pulses are full and equal bilaterally Extremities:  No clubbing, cyanosis, edema, or deformity noted with normal full range of motion of all joints.   Neurologic:  No cranial nerve deficits noted. Station and gait are normal. Plantar reflexes are down-going bilaterally. DTRs are symmetrical throughout. Sensory, motor and coordinative functions appear intact. Skin:  Intact without suspicious lesions or rashes Cervical Nodes:  no anterior cervical adenopathy and no posterior cervical adenopathy.   Axillary  Nodes:  no R axillary adenopathy and no L axillary adenopathy.   Psych:  Cognition and judgment appear intact. Alert and cooperative with normal attention span and concentration. No apparent delusions, illusions, hallucinations   Impression & Recommendations:  Problem # 1:  BRONCHITIS-ACUTE (ICD-466.0) Assessment New  Her updated medication list for this problem includes:    Avelox 400 Mg Tabs (Moxifloxacin hcl) ..... One by mouth once daily for 5 days    Tussionex Pennkinetic Er 8-10 Mg/50ml Lqcr (Chlorpheniramine-hydrocodone) .Marland KitchenMarland KitchenMarland KitchenMarland Kitchen 5 ml by mouth two times a day as needed for cough  Orders: Admin of Therapeutic Inj  intramuscular or subcutaneous (16109) Depo- Medrol 40mg  (J1030) Depo- Medrol 80mg  (J1040)  Take antibiotics and other medications as directed. Encouraged to push clear liquids, get enough rest, and take acetaminophen as needed. To be seen in 5-7 days if no improvement, sooner if worse.  Problem # 2:  HYPERTENSION (ICD-401.9) Assessment: Unchanged  Her updated medication list for this problem includes:    Furosemide 40 Mg Tabs (Furosemide) .[name]... 1po once daily    Lisinopril 40 Mg Tabs (Lisinopril) .[name]... Take 1 tablet by mouth  once a day    Toprol Xl 100 Mg Tb24 (Metoprolol succinate) .[name]... 1 by mouth once daily  BP today: 144/90 Prior BP: 162/96 (03/01/2009)  10 Yr Risk Heart Disease: 8 % Prior 10 Yr Risk Heart Disease: 6 % (01/29/2009)  Labs Reviewed: K+: 4.2 (01/26/2009) Creat: : 0.5 (01/26/2009)   Chol: 160 (01/26/2009)   HDL: 30.90 (01/26/2009)   LDL: 93 (01/26/2009)   TG: 181.0 (01/26/2009)  Complete Medication List: 1)  Furosemide 40 Mg Tabs (Furosemide) .[name].. 1po once daily 2)  Levothyroxine Sodium 137 Mcg Tabs (Levothyroxine sodium) .... Take 1 tablet by mouth once a  day 3)  Lisinopril 40 Mg Tabs (Lisinopril) .... Take 1 tablet by mouth once a day 4)  Toprol Xl 100 Mg Tb24 (Metoprolol succinate) .[name].. 1 by mouth once daily 5)  Simvastatin 40 Mg Tabs  (Simvastatin) .[name].. 1 by mouth once daily 6)  Adult Aspirin Ec Low Strength 81 Mg Tbec (Aspirin) .[name].. 1po qd 7)  Fluoxetine Hcl 20 Mg Caps (Fluoxetine hcl) .[name].. 1po once daily 8)  Avelox 400 Mg Tabs (Moxifloxacin hcl) .... One by mouth once daily for 5 days 9)  Tussionex Pennkinetic Er 8-10 Mg/1ml Lqcr (Chlorpheniramine-hydrocodone) .... 5 ml by mouth two times a day as needed for cough  Hypertension Assessment/Plan:      The patient's hypertensive risk group is category B: At least one risk factor (excluding diabetes) with no target organ damage.  Her calculated 10 year risk of coronary heart disease is 8 %.  Today's blood pressure is 144/90.  Her blood pressure goal is < 140/90.  Patient Instructions: 1)  Please schedule a follow-up appointment in 2 weeks. 2)  Take your antibiotic as prescribed until ALL of it is gone, but stop if you develop a rash or swelling and contact our office as soon as possible. 3)  Acute bronchitis symptoms for less than 10 days are not helped by antibiotics. take over the counter cough medications. call if no improvment in  5-7 days, sooner if increasing cough, fever, or new symptoms( shortness of breath, chest pain). 4)  Check your Blood Pressure regularly. If it is above 140/90: you should make an appointment. Prescriptions: TUSSIONEX PENNKINETIC ER 8-10 MG/5ML LQCR (CHLORPHENIRAMINE-HYDROCODONE) 5 ML by mouth two times a day as needed for cough  #4 ounces x 0   Entered and Authorized by:   Etta Grandchild MD   Signed by:   Etta Grandchild MD on 08/03/2009   Method used:   Print then Give to Patient   RxID:   [id number] AVELOX 400 MG TABS (MOXIFLOXACIN HCL) One by mouth once daily for 5 days  #5 x 0   Entered and Authorized by:   Etta Grandchild MD   Signed by:   Etta Grandchild MD on 08/03/2009   Method used:   Samples Given   RxID:   [id number]    Medication Administration  Injection # 1:    Medication: Depo- Medrol 80mg     Diagnosis:  BRONCHITIS-ACUTE (ICD-466.0)    Route: IM    Site: L deltoid    Exp Date: 02/2012    Lot #: [id number] Heidelberg    Mfr: [email or web address]    Patient tolerated injection without complications    Given by: Rock Nephew CMA (August 03, 2009 8:47 AM)  Injection # 2:    Medication: Depo- Medrol 40mg     Diagnosis: BRONCHITIS-ACUTE (ICD-466.0)    Route: IM    Site: L deltoid  Patient tolerated injection without complications    Given by: Rock Nephew CMA (August 03, 2009 8:47 AM)  Orders Added: 1)  Admin of Therapeutic Inj  intramuscular or subcutaneous [96372] 2)  Depo- Medrol 40mg  [J1030] 3)  Depo- Medrol 80mg  [J1040] 4)  Est. Patient Level IV [04540]

## 2010-08-02 ENCOUNTER — Encounter (INDEPENDENT_AMBULATORY_CARE_PROVIDER_SITE_OTHER): Payer: Self-pay | Admitting: *Deleted

## 2010-08-02 ENCOUNTER — Ambulatory Visit (INDEPENDENT_AMBULATORY_CARE_PROVIDER_SITE_OTHER): Payer: 59 | Admitting: Adult Health

## 2010-08-02 ENCOUNTER — Other Ambulatory Visit: Payer: 59

## 2010-08-02 ENCOUNTER — Encounter: Payer: Self-pay | Admitting: Adult Health

## 2010-08-02 ENCOUNTER — Other Ambulatory Visit: Payer: Self-pay | Admitting: Internal Medicine

## 2010-08-02 DIAGNOSIS — I10 Essential (primary) hypertension: Secondary | ICD-10-CM

## 2010-08-02 DIAGNOSIS — E7800 Pure hypercholesterolemia, unspecified: Secondary | ICD-10-CM

## 2010-08-02 DIAGNOSIS — J189 Pneumonia, unspecified organism: Secondary | ICD-10-CM

## 2010-08-02 DIAGNOSIS — E785 Hyperlipidemia, unspecified: Secondary | ICD-10-CM

## 2010-08-02 LAB — LIPID PANEL
Cholesterol: 191 mg/dL (ref 0–200)
HDL: 39 mg/dL — ABNORMAL LOW (ref >39.00)
Total CHOL/HDL Ratio: 5
Triglycerides: 257 mg/dL — ABNORMAL HIGH (ref 0.0–149.0)

## 2010-08-02 LAB — BASIC METABOLIC PANEL
BUN: 10 mg/dL (ref 6–23)
Calcium: 8.9 mg/dL (ref 8.4–10.5)
Potassium: 4.4 mEq/L (ref 3.5–5.1)

## 2010-08-06 DIAGNOSIS — J189 Pneumonia, unspecified organism: Secondary | ICD-10-CM

## 2010-08-06 HISTORY — DX: Pneumonia, unspecified organism: J18.9

## 2010-08-07 NOTE — Letter (Signed)
Summary: Out of Work  [employer]  [address]   [place], [place] [address]   Phone: [phone]  Fax: [phone]    August 02, 2010   Employee:  LAURELAI LEPP    To Whom It May Concern:   For Medical reasons, please excuse the above named employee from work for the following dates:  Start:   Friday August 02, 2010  End:   Monday August 05, 2010 - may return to work on this date.  If you need additional information, please feel free to contact our office.         Sincerely,        Tammy Parrett, N.P

## 2010-08-15 NOTE — Assessment & Plan Note (Signed)
Summary: bi-later pna per PA sara [email or web address]  //kp   Vital Signs:  Patient profile:   45 year old female Weight:      317.50 pounds O2 Sat:      96 % on Room air Temp:     98.6 degrees F oral Pulse rate:   70 / minute BP sitting:   170 / 82  (left arm) Cuff size:   large  Vitals Entered By: Abigail Miyamoto RN (August 02, 2010 10:59 AM)  O2 Flow:  Room air  Copy to:  Oliver Barre Primary Provider/Referring Provider:  Corwin Levins MD  CC:  OV - Seen at [facility] on 07-29-10 and diagnosed with pnuemonia - Has one pill left of abx (Levquin) pt not sure) - PT feels alot better - Still c/o fatigue and sleeps alot - only occas cough - Wheezing is gone - Took CHantix for 2 weeks but stopped due to HA's - Pt wants a rx for smoking patch.  History of Present Illness: 45 yo with  known hx of mild OSA with nocturnal desaturations on O2 at bedtime (not CPAP)  August 02, 2010 --Presents for UC follow up . Seen OV - Seen at [facility] on 07-29-10 and diagnosed with pnuemonia . Tx w/Levaquin 5days. - Has one pill left of abx  Levaquin 750mg   PT feels alot better - Still c/o fatigue and sleeps alot - only occas cough - Wheezing is gone - Took CHantix for 2 weeks but stopped due to HA's - Pt wants a rx for smoking patch. Was seen few weeks ago and tx for bronchitis with zpack. No records available. Then returned to [facility] on 1/30 with persistent symptoms and given abx. Now she is feeling better. Cough is decreasd and wheezing is resolved.Denies chest pain,  orthopnea, hemoptysis, fever, n/v/d, edema, headache  Current Medications (verified): 1)  Furosemide 40 Mg  Tabs (Furosemide) .[name].. 1po Once Daily 2)  Levothyroxine Sodium 175 Mcg Tabs (Levothyroxine Sodium) .[name].. 1po Once Daily 3)  Lisinopril 20 Mg Tabs (Lisinopril) .... One Tablet Two Times A Day 4)  Metoprolol Tartrate 50 Mg Tabs (Metoprolol Tartrate) .[name].. 1 By Mouth Two Times A Day 5)  Simvastatin 40 Mg Tabs (Simvastatin) .[name].. 1 By Mouth  Once Daily 6)  Adult Aspirin Ec Low Strength 81 Mg  Tbec (Aspirin) .[name].. 1po Qd 7)  Fluoxetine Hcl 20 Mg Caps (Fluoxetine Hcl) .[name].. 1po Once Daily 8)  Chantix Starting Month Pak 0.5 Mg X 11 & 1 Mg X 42  Misc (Varenicline Tartrate) .... Take As Directed---Refills Are To Be For The Continuing Pak 9)  Oxygen 2l .... Use At Bedtime  Allergies (verified): No Known Drug Allergies  Comments:  Nurse/Medical Assistant: The patient's medications and allergies were reviewed with the patient and were updated in the Medication and Allergy Lists.  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  less than 1 ppd.    Review of Systems      See HPI  Physical Exam  Additional Exam:  GEN: A/Ox3; pleasant , NAD HEENT:  [place]/AT, , EACs-clear, TMs-wnl, NOSE-clear, THROAT-clear NECK:  Supple w/ fair ROM; no JVD; normal carotid impulses w/o bruits; no thyromegaly or nodules palpated; no lymphadenopathy. RESP  Clear to P & A; w/o, wheezes/ rales/ or rhonchi. CARD:  RRR, no m/r/g   GI:   Soft & nt; nml bowel sounds; no organomegaly or masses detected. [place]: Warm bil,  no calf tenderness edema, clubbing, pulses intact Neuro: intact    Impression & Recommendations:  Problem # 1:  PNEUMONIA (ICD-486)  Recently dx  with PNA at Urgent care-clinically improving on abx.  will need follow up chest xray in 2 weeks. Plan: Finish Levaquin  Use Mucinex DM two times a day as needed cough/congestion May use nicotine patches to help stop smoking. increase fluids and rest  follow up DR Jonny Ruiz in February as plannned will follow up chest xray at that time. send copy to Dr. Jonny Ruiz.   Orders: Est. Patient Level III (04540)  Problem # 2:  SLEEP RELATED HYPOVENTILATION/HYPOXEMIA CCE (ICD-327.26) follow up Dr Shelle Iron for sleep issues as planned   Medications Added to Medication List This Visit: 1)  Oxygen 2l  .... Use at bedtime 2)  Eq Nicotine 21 Mg/24hr Pt24 (Nicotine) .... Use as directed.  Patient Instructions: 1)  Finish Levaquin  2)  Use Mucinex DM two times a day as needed cough/congestion 3)  May use nicotine patches to help stop smoking. 4)  increase fluids and rest  5)  follow up DR Jonny Ruiz in February as plannned will follow up chest xray at that time. 6)  send copy to Dr. Jonny Ruiz.  Prescriptions: EQ NICOTINE 21 MG/24HR PT24 (NICOTINE) use as directed.  #1 x 2   Entered and Authorized by:   Rubye Oaks NP   Signed by:   Rubye Oaks NP on 08/02/2010   Method used:   [facility]       [address]       [place], [place]  [address]       Ph: [phone]       Fax: [phone]   RxID:   [id number]    Orders Added: 1)  Est. Patient Level III [72536]

## 2010-08-16 ENCOUNTER — Other Ambulatory Visit: Payer: Self-pay | Admitting: Internal Medicine

## 2010-08-16 ENCOUNTER — Encounter (INDEPENDENT_AMBULATORY_CARE_PROVIDER_SITE_OTHER): Payer: Self-pay | Admitting: *Deleted

## 2010-08-16 ENCOUNTER — Other Ambulatory Visit: Payer: 59

## 2010-08-16 DIAGNOSIS — E785 Hyperlipidemia, unspecified: Secondary | ICD-10-CM

## 2010-08-16 DIAGNOSIS — I10 Essential (primary) hypertension: Secondary | ICD-10-CM

## 2010-08-16 LAB — BASIC METABOLIC PANEL
Calcium: 8.9 mg/dL (ref 8.4–10.5)
Chloride: 100 mEq/L (ref 96–112)
Creatinine, Ser: 0.6 mg/dL (ref 0.4–1.2)
GFR: 117.32 mL/min (ref >60.00)

## 2010-08-16 LAB — LIPID PANEL
Cholesterol: 177 mg/dL (ref 0–200)
Total CHOL/HDL Ratio: 5
Triglycerides: 202 mg/dL — ABNORMAL HIGH (ref 0.0–149.0)
VLDL: 40.4 mg/dL — ABNORMAL HIGH (ref 0.0–40.0)

## 2010-08-22 ENCOUNTER — Encounter: Payer: Self-pay | Admitting: Internal Medicine

## 2010-08-22 ENCOUNTER — Ambulatory Visit (INDEPENDENT_AMBULATORY_CARE_PROVIDER_SITE_OTHER): Payer: 59 | Admitting: Internal Medicine

## 2010-08-22 DIAGNOSIS — I10 Essential (primary) hypertension: Secondary | ICD-10-CM

## 2010-08-22 DIAGNOSIS — F341 Dysthymic disorder: Secondary | ICD-10-CM

## 2010-08-22 DIAGNOSIS — G4736 Sleep related hypoventilation in conditions classified elsewhere: Secondary | ICD-10-CM

## 2010-08-22 DIAGNOSIS — E785 Hyperlipidemia, unspecified: Secondary | ICD-10-CM

## 2010-08-27 NOTE — Assessment & Plan Note (Signed)
Summary: follow up appt/#   Vital Signs:  Patient profile:   45 year old female Height:      62 inches Weight:      314.13 pounds BMI:     57.66 O2 Sat:      94 % on Room air Temp:     98.6 degrees F oral Pulse rate:   69 / minute BP sitting:   130 / 100  (left arm) Cuff size:   large  Vitals Entered By: Zella Ball Ewing CMA Duncan Dull) (August 22, 2010 8:32 AM)  O2 Flow:  Room air  Preventive Care Screening  Mammogram:    Date:  04/30/2010    Results:  normal   CC: followup/RE   Primary Care Provider:  Corwin Levins MD  CC:  followup/RE.  History of Present Illness: here to f/u - sleep much improved with the overnight home o2 at 2L;  hasd severe HA with chantix, to start the  nicotine patch soon to be able to stop the 5 cigs per night she currently does;  [plans to see derm soon for the neck skin tags;  plans to have the pap smear soon;  trying to excercise more - 313 pk wt per pt  - go9t down to 308, then back up to 314 after recent illness;  Pt denies CP, worsening sob, doe, wheezing, orthopnea, pnd, worsening LE edema, palps, dizziness or syncope  Pt denies new neuro symptoms such as headache, facial or extremity weakness  Pt denies polydipsia, polyuria   Overall good compliance with meds, trying to follow low chol diet, wt stable, little excercise however .  No fever, wt loss, night sweats, loss of appetite or other constitutional symptoms  Overall good compliance with meds, and good tolerability.  Denies worsening depressive symptoms, suicidal ideation, or panic, though has ongoing mild anxiety.   BP with recent illness was about 160 SBP, but more recently  DBP in the 80-90's.   Preventive Screening-Counseling & Management      Drug Use:  no.    Problems Prior to Update: 1)  Pneumonia  (ICD-486) 2)  Frequency, Urinary  (ICD-788.41) 3)  Otitis Media, Acute, Left  (ICD-382.9) 4)  Sleep Related Hypoventilation/hypoxemia Cce  (ICD-327.26) 5)  Anxiety Depression  (ICD-300.4) 6)   Polycythemia  (ICD-289.0) 7)  Preventive Health Care  (ICD-V70.0) 8)  Anxiety  (ICD-300.00) 9)  Depression  (ICD-311) 10)  Hypothyroidism  (ICD-244.9) 11)  Hypertension  (ICD-401.9) 12)  Hyperlipidemia  (ICD-272.4)  Medications Prior to Update: 1)  Furosemide 40 Mg  Tabs (Furosemide) .[name].. 1po Once Daily 2)  Levothyroxine Sodium 175 Mcg Tabs (Levothyroxine Sodium) .[name].. 1po Once Daily 3)  Lisinopril 20 Mg Tabs (Lisinopril) .... One Tablet Two Times A Day 4)  Metoprolol Tartrate 50 Mg Tabs (Metoprolol Tartrate) .[name].. 1 By Mouth Two Times A Day 5)  Simvastatin 40 Mg Tabs (Simvastatin) .[name].. 1 By Mouth Once Daily 6)  Adult Aspirin Ec Low Strength 81 Mg  Tbec (Aspirin) .[name].. 1po Qd 7)  Fluoxetine Hcl 20 Mg Caps (Fluoxetine Hcl) .[name].. 1po Once Daily 8)  Oxygen 2l .... Use At Bedtime 9)  Eq Nicotine 21 Mg/24hr Pt24 (Nicotine) .... Use As Directed.  Current Medications (verified): 1)  Furosemide 40 Mg  Tabs (Furosemide) .[name].. 1po Once Daily 2)  Levothyroxine Sodium 175 Mcg Tabs (Levothyroxine Sodium) .[name].. 1po Once Daily 3)  Lisinopril 20 Mg Tabs (Lisinopril) .... One Tablet Two Times A Day 4)  Metoprolol Tartrate 50 Mg Tabs (  Metoprolol Tartrate) .[name].. 1 By Mouth Two Times A Day 5)  Simvastatin 40 Mg Tabs (Simvastatin) .[name].. 1 By Mouth Once Daily 6)  Adult Aspirin Ec Low Strength 81 Mg  Tbec (Aspirin) .[name].. 1po Qd 7)  Fluoxetine Hcl 20 Mg Caps (Fluoxetine Hcl) .[name].. 1po Once Daily 8)  Oxygen 2l .... Use At Bedtime 9)  Eq Nicotine 21 Mg/24hr Pt24 (Nicotine) .... Use As Directed.  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  less than 1 ppd.   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  less than 1 ppd.   Drug use-no Drug Use:  no  Review of Systems       all otherwise negative per pt -    Physical Exam  General:  alert and overweight-appearing.   Head:  normocephalic and atraumatic.   Eyes:  vision grossly intact, pupils equal, and pupils round.   Ears:  R ear normal and L ear normal.   Nose:  no external deformity and no nasal discharge.   Mouth:  no gingival abnormalities and pharynx pink and moist.   Neck:  supple and no masses.   Lungs:  normal respiratory effort and normal breath sounds.   Heart:  normal rate and regular rhythm.   Abdomen:  soft, non-tender, and normal bowel sounds.   Extremities:  no edema, no erythema  Skin:  color normal and no rashes.   Psych:  not anxious appearing and not depressed appearing.     Impression & Recommendations:  Problem # 1:  SLEEP RELATED HYPOVENTILATION/HYPOXEMIA CCE (ICD-327.26) syptomatically much improved;  will cont as if for now, consider ONO about june 2012 after further efforts at wt loss , to re-assess for ongoing 2l o2 at night need  Problem # 2:  HYPERTENSION (ICD-401.9)  Her updated medication list for this problem includes:    Furosemide 40 Mg Tabs (Furosemide) .[name]... 1po once daily    Lisinopril 20 Mg Tabs (Lisinopril) ..... One tablet two times a day    Metoprolol Tartrate 50 Mg Tabs (Metoprolol tartrate) .[name]... 1 by mouth two times a  day  BP today: 130/100 Prior BP: 170/82 (08/02/2010)  Prior 10 Yr Risk Heart Disease: 8 % (08/03/2009)  Labs Reviewed: K+: 4.7 (08/16/2010) Creat: : 0.6 (08/16/2010)   Chol: 177 (08/16/2010)   HDL: 38.10 (08/16/2010)   LDL: 94 (02/18/2010)   TG: 202.0 (08/16/2010) improved overall;  mild elev today, likely situational, ok to follow, continue same treatment ,  to f/u BP at home and next visit  Problem # 3:  HYPERLIPIDEMIA (ICD-272.4)  Her updated medication list for this problem includes:    Simvastatin 40 Mg Tabs (Simvastatin) .[name]... 1 by mouth once daily  Labs Reviewed: SGOT: 19 (02/18/2010)   SGPT: 19 (02/18/2010)  Prior 10 Yr Risk Heart Disease: 8 % (08/03/2009)  HDL:38.10 (08/16/2010), 39.00 (08/02/2010)  LDL:94 (02/18/2010), 93 (01/26/2009)  Chol:177 (08/16/2010), 191 (08/02/2010)  Trig:202.0 (08/16/2010), 257.0 (08/02/2010) stable overall by hx and exam, ok to continue meds/tx as is , Pt to continue diet efforts, good med tolerance; labs reviewed with pt- goal LDL less than 100  Problem # 4:  ANXIETY DEPRESSION (ICD-300.4) stable overall by hx and exam, ok to continue meds/tx as is - no further meds needed at this time  Complete Medication List: 1)  Furosemide 40 Mg Tabs (Furosemide) .[name].. 1po once daily 2)  Levothyroxine Sodium 175 Mcg Tabs (Levothyroxine sodium) .[name].. 1po once daily 3)  Lisinopril 20 Mg Tabs (Lisinopril) .... One tablet two times a day 4)  Metoprolol Tartrate 50 Mg Tabs (Metoprolol tartrate) .[name].. 1 by mouth two times a day 5)  Simvastatin 40 Mg Tabs (Simvastatin) .[name].. 1 by mouth once daily 6)  Adult Aspirin Ec Low Strength 81 Mg Tbec (Aspirin) .[name].. 1po qd 7)  Fluoxetine Hcl 20 Mg Caps (Fluoxetine hcl) .[name].. 1po once daily 8)  Oxygen 2l  .... Use at bedtime 9)  Eq Nicotine 21 Mg/24hr Pt24 (Nicotine) .... Use as directed.  Patient Instructions: 1)  please call in June 2012 to remind [name] to order the Overnight Oximetry off oxygen to see if you still need  it 2)  Continue all previous medications as before this visit  3)  Please continue your diet and excercise and wt loss 4)  Please schedule a follow-up appointment in 6 months for CPX with labs:     Orders Added: 1)  Est. Patient Level IV [16109]

## 2011-03-10 ENCOUNTER — Other Ambulatory Visit: Payer: Self-pay | Admitting: Internal Medicine

## 2011-03-10 ENCOUNTER — Telehealth: Payer: Self-pay

## 2011-03-10 DIAGNOSIS — Z0000 Encounter for general adult medical examination without abnormal findings: Secondary | ICD-10-CM

## 2011-03-10 DIAGNOSIS — Z1231 Encounter for screening mammogram for malignant neoplasm of breast: Secondary | ICD-10-CM

## 2011-03-10 NOTE — Telephone Encounter (Signed)
Put order in for physical labs. 

## 2011-04-04 ENCOUNTER — Other Ambulatory Visit (INDEPENDENT_AMBULATORY_CARE_PROVIDER_SITE_OTHER): Payer: 59

## 2011-04-04 ENCOUNTER — Ambulatory Visit
Admission: RE | Admit: 2011-04-04 | Discharge: 2011-04-04 | Disposition: A | Discharge status: Home / Self Care | Payer: 59 | Source: Ambulatory Visit | Attending: Internal Medicine | Admitting: Internal Medicine

## 2011-04-04 DIAGNOSIS — Z1231 Encounter for screening mammogram for malignant neoplasm of breast: Secondary | ICD-10-CM

## 2011-04-04 DIAGNOSIS — Z0000 Encounter for general adult medical examination without abnormal findings: Secondary | ICD-10-CM

## 2011-04-04 LAB — URINALYSIS, ROUTINE W REFLEX MICROSCOPIC
Bilirubin Urine: NEGATIVE
Specific Gravity, Urine: 1.025 (ref 1.000–1.030)
Urine Glucose: NEGATIVE
Urobilinogen, UA: 0.2 (ref 0.0–1.0)

## 2011-04-04 LAB — HEPATIC FUNCTION PANEL
AST: 20 U/L (ref 0–37)
Albumin: 3.8 g/dL (ref 3.5–5.2)
Alkaline Phosphatase: 63 U/L (ref 39–117)
Bilirubin, Direct: 0.1 mg/dL (ref 0.0–0.3)
Total Protein: 7.3 g/dL (ref 6.0–8.3)

## 2011-04-04 LAB — LIPID PANEL
HDL: 36.3 mg/dL — ABNORMAL LOW (ref >39.00)
LDL Cholesterol: 96 mg/dL (ref 0–99)
Total CHOL/HDL Ratio: 5

## 2011-04-04 LAB — CBC WITH DIFFERENTIAL/PLATELET
Basophils Absolute: 0 10*3/uL (ref 0.0–0.1)
Basophils Relative: 0.4 % (ref 0.0–3.0)
Hemoglobin: 16.9 g/dL — ABNORMAL HIGH (ref 12.0–15.0)
Lymphs Abs: 1.5 10*3/uL (ref 0.7–4.0)
MCV: 90.8 fl (ref 78.0–100.0)
RBC: 5.49 Mil/uL — ABNORMAL HIGH (ref 3.87–5.11)

## 2011-04-04 LAB — BASIC METABOLIC PANEL
BUN: 10 mg/dL (ref 6–23)
Chloride: 101 mEq/L (ref 96–112)
Creatinine, Ser: 0.5 mg/dL (ref 0.4–1.2)
Glucose, Bld: 97 mg/dL (ref 70–99)

## 2011-04-04 LAB — TSH: TSH: 3.1 u[IU]/mL (ref 0.35–5.50)

## 2011-04-06 ENCOUNTER — Encounter: Payer: Self-pay | Admitting: Internal Medicine

## 2011-04-06 DIAGNOSIS — Z0000 Encounter for general adult medical examination without abnormal findings: Secondary | ICD-10-CM

## 2011-04-06 HISTORY — DX: Encounter for general adult medical examination without abnormal findings: Z00.00

## 2011-04-11 ENCOUNTER — Encounter: Payer: Self-pay | Admitting: Internal Medicine

## 2011-04-11 ENCOUNTER — Ambulatory Visit (INDEPENDENT_AMBULATORY_CARE_PROVIDER_SITE_OTHER): Payer: 59 | Admitting: Internal Medicine

## 2011-04-11 VITALS — BP 128/98 | HR 73 | Temp 98.5°F | Ht 67.0 in | Wt 317.0 lb

## 2011-04-11 DIAGNOSIS — R7302 Impaired glucose tolerance (oral): Secondary | ICD-10-CM | POA: Insufficient documentation

## 2011-04-11 DIAGNOSIS — N390 Urinary tract infection, site not specified: Secondary | ICD-10-CM

## 2011-04-11 DIAGNOSIS — Z23 Encounter for immunization: Secondary | ICD-10-CM

## 2011-04-11 DIAGNOSIS — Z0000 Encounter for general adult medical examination without abnormal findings: Secondary | ICD-10-CM

## 2011-04-11 DIAGNOSIS — F17200 Nicotine dependence, unspecified, uncomplicated: Secondary | ICD-10-CM

## 2011-04-11 DIAGNOSIS — Z87891 Personal history of nicotine dependence: Secondary | ICD-10-CM | POA: Insufficient documentation

## 2011-04-11 DIAGNOSIS — R7309 Other abnormal glucose: Secondary | ICD-10-CM

## 2011-04-11 MED ORDER — CEPHALEXIN 500 MG PO CAPS: 500.0000 mg | ORAL_CAPSULE | Freq: Four times a day (QID) | ORAL | Status: AC

## 2011-04-11 MED ORDER — LEVOTHYROXINE SODIUM 175 MCG PO TABS: 175.0000 ug | ORAL_TABLET | Freq: Every day | ORAL | Status: DC

## 2011-04-11 MED ORDER — SIMVASTATIN 40 MG PO TABS: 40.0000 mg | ORAL_TABLET | Freq: Every day | ORAL | Status: DC

## 2011-04-11 MED ORDER — BUPROPION HCL ER (XL) 150 MG PO TB24: 150.0000 mg | ORAL_TABLET | ORAL | Status: DC

## 2011-04-11 MED ORDER — FLUOXETINE HCL 20 MG PO CAPS: 40.0000 mg | ORAL_CAPSULE | Freq: Every day | ORAL | Status: DC

## 2011-04-11 MED ORDER — FUROSEMIDE 40 MG PO TABS: 40.0000 mg | ORAL_TABLET | Freq: Every day | ORAL | Status: DC

## 2011-04-11 MED ORDER — METOPROLOL TARTRATE 50 MG PO TABS: 50.0000 mg | ORAL_TABLET | Freq: Two times a day (BID) | ORAL | Status: DC

## 2011-04-11 NOTE — Progress Notes (Signed)
Subjective:    Patient ID: Autumn Massey, female    DOB: 10/15/1965, 45 y.o.   MRN: [id number]  HPI  Here for wellness and f/u;  Overall doing ok;  Pt denies CP, worsening SOB, DOE, wheezing, orthopnea, PND, worsening LE edema, palpitations, dizziness or syncope.  Pt denies neurological change such as new Headache, facial or extremity weakness.  Pt denies polydipsia, polyuria, or low sugar symptoms. Pt states overall good compliance with treatment and medications, good tolerability, and trying to follow lower cholesterol diet.  Pt denies worsening depressive symptoms, suicidal ideation or panic. No fever, wt loss, night sweats, loss of appetite, or other constitutional symptoms.  Pt states good ability with ADL's, low fall risk, home safety reviewed and adequate, no significant changes in hearing or vision, and occasionally active with exercise. No new complaints, except though she did not have UTI symptoms when called last wk from our office to inquire after her UA was noted abnormal, she does now state 2-3 days typical cystitis like symtpoms with dysuria and mild frequency, though no fever, back pain, n/v, chills, hematuria.  Lost from 330 to 317 over 1 yr, and back pain much improved. Still trying to quit smoking- chantix did not help, insurance wont pay for OTC patch.  Has also seen Dr Roe/dental in July 2012, mammogram recently oct 5, Dr Blair Promise for pap, and Dr West Pugh scheduled for November. Still smoking - urged to quit., chantix did not help, but is willing to try wellbutrin Past Medical History  Diagnosis Date  . ANXIETY DEPRESSION 01/29/2009  . DEPRESSION 11/19/2007  . FREQUENCY, URINARY 10/17/2009  . HYPERLIPIDEMIA 11/19/2007  . HYPERTENSION 11/19/2007  . HYPOTHYROIDISM 11/19/2007  . PNEUMONIA 08/06/2010  . POLYCYTHEMIA 01/29/2009  . Preventative health care 04/06/2011  . SLEEP RELATED HYPOVENTILATION/HYPOXEMIA CCE 02/08/2009   Past Surgical History  Procedure Date  . Tonsillectomy     reports that she has been smoking.  She does not have any smokeless tobacco history on file. She reports that she drinks alcohol. She reports that she does not use illicit drugs. family history includes Alcohol abuse in her other; Arthritis in her other; Cancer in her maternal grandfather and mother; and Hypertension in her other. No Known Allergies Current Outpatient Prescriptions on File Prior to Visit  Medication Sig Dispense Refill  . aspirin 81 MG tablet Take 81 mg by mouth daily.        [name] lisinopril (PRINIVIL,ZESTRIL) 20 MG tablet TAKE TWO TABLETS BY MOUTH EVERY DAY  180 tablet  0  . NON FORMULARY Oxygen 2L use as bedtime        Review of Systems Review of Systems  Constitutional: Negative for diaphoresis, activity change, appetite change and unexpected weight change.  HENT: Negative for hearing loss, ear pain, facial swelling, mouth sores and neck stiffness.   Eyes: Negative for pain, redness and visual disturbance.  Respiratory: Negative for shortness of breath and wheezing.   Cardiovascular: Negative for chest pain and palpitations.  Gastrointestinal: Negative for diarrhea, blood in stool, abdominal distention and rectal pain.  Genitourinary: Negative for hematuria, flank pain and decreased urine volume.  Musculoskeletal: Negative for myalgias and joint swelling.  Skin: Negative for color change and wound.  Neurological: Negative for syncope and numbness.  Hematological: Negative for adenopathy.  Psychiatric/Behavioral: Negative for hallucinations, self-injury, decreased concentration and agitation.     Objective:   Physical Exam BP 128/98  Pulse 73  Temp(Src) 98.5 F (36.9 C) (Oral)  Ht 5\' 7"  (1.702 m)  Wt 317 lb (143.79 kg)  BMI 49.65 kg/m2  SpO2 94%  LMP 04/04/2011 Physical Exam  VS noted, morbid obese, not ill appearing Constitutional: Pt is oriented to person, place, and time. Appears well-developed and well-nourished.  HENT:  Head: Normocephalic and atraumatic.    Right Ear: External ear normal.  Left Ear: External ear normal.  Nose: Nose normal.  Mouth/Throat: Oropharynx is clear and moist.  Eyes: Conjunctivae and EOM are normal. Pupils are equal, round, and reactive to light.  Neck: Normal range of motion. Neck supple. No JVD present. No tracheal deviation present.  Cardiovascular: Normal rate, regular rhythm, normal heart sounds and intact distal pulses.   Pulmonary/Chest: Effort normal and breath sounds normal.  Abdominal: Soft. Bowel sounds are normal. There is no tenderness.  Musculoskeletal: Normal range of motion. Exhibits no edema.  Lymphadenopathy:  Has no cervical adenopathy.  Neurological: Pt is alert and oriented to person, place, and time. Pt has normal reflexes. No cranial nerve deficit.  Skin: Skin is warm and dry. No rash noted.  Psychiatric:  Has  normal mood and affect. Behavior is normal.     Assessment & Plan:

## 2011-04-11 NOTE — Patient Instructions (Addendum)
Take all new medications as prescribed - the antibiotic, and generic wellbutrin Continue all other medications as before (all refills sent to pharmacy) Please continue to work on lower cholesterol diet, regular activity, and weight loss Please stop smoking You had the flu shot today Please return in 1 year for your yearly visit, or sooner if needed, with Lab testing done 3-5 days before

## 2011-04-12 NOTE — Assessment & Plan Note (Signed)

## 2011-04-13 ENCOUNTER — Encounter: Payer: Self-pay | Admitting: Internal Medicine

## 2011-04-13 NOTE — Assessment & Plan Note (Signed)
With random blood sugar 140 per work biometric screening oct 2012, to check a1c next visit

## 2011-04-13 NOTE — Assessment & Plan Note (Signed)
Mild to mod, for antibx course,  to f/u any worsening symptoms or concerns 

## 2011-04-13 NOTE — Assessment & Plan Note (Addendum)
Urged to quit smoking, to add wellbutrin asd to assist, may help with depressive symptoms as well?

## 2011-06-08 ENCOUNTER — Other Ambulatory Visit: Payer: Self-pay | Admitting: Internal Medicine

## 2011-06-13 ENCOUNTER — Telehealth: Payer: Self-pay

## 2011-06-13 NOTE — Telephone Encounter (Signed)
A user error has taken place: encounter opened in error, closed for administrative reasons.

## 2011-12-03 ENCOUNTER — Encounter: Payer: Self-pay | Admitting: Internal Medicine

## 2011-12-03 ENCOUNTER — Ambulatory Visit (INDEPENDENT_AMBULATORY_CARE_PROVIDER_SITE_OTHER): Payer: 59 | Admitting: Internal Medicine

## 2011-12-03 ENCOUNTER — Ambulatory Visit (INDEPENDENT_AMBULATORY_CARE_PROVIDER_SITE_OTHER)
Admission: RE | Admit: 2011-12-03 | Discharge: 2011-12-03 | Disposition: A | Discharge status: Home / Self Care | Payer: 59 | Source: Ambulatory Visit | Attending: Internal Medicine | Admitting: Internal Medicine

## 2011-12-03 VITALS — BP 172/102 | HR 64 | Temp 99.5°F | Ht 62.0 in | Wt 322.1 lb

## 2011-12-03 DIAGNOSIS — R7302 Impaired glucose tolerance (oral): Secondary | ICD-10-CM

## 2011-12-03 DIAGNOSIS — J209 Acute bronchitis, unspecified: Secondary | ICD-10-CM

## 2011-12-03 DIAGNOSIS — I10 Essential (primary) hypertension: Secondary | ICD-10-CM

## 2011-12-03 DIAGNOSIS — R062 Wheezing: Secondary | ICD-10-CM

## 2011-12-03 DIAGNOSIS — R7309 Other abnormal glucose: Secondary | ICD-10-CM

## 2011-12-03 MED ORDER — HYDROCODONE-HOMATROPINE 5-1.5 MG/5ML PO SYRP: 5.0000 mL | ORAL_SOLUTION | Freq: Four times a day (QID) | ORAL | Status: AC | PRN

## 2011-12-03 MED ORDER — PREDNISONE 10 MG PO TABS: 10.0000 mg | ORAL_TABLET | Freq: Every day | ORAL | Status: DC

## 2011-12-03 MED ORDER — LEVOFLOXACIN 250 MG PO TABS: 250.0000 mg | ORAL_TABLET | Freq: Every day | ORAL | Status: AC

## 2011-12-03 MED ORDER — METHYLPREDNISOLONE ACETATE 80 MG/ML IJ SUSP
120.0000 mg | Freq: Once | INTRAMUSCULAR | Status: AC
  Administered 2011-12-03: 120 mg via INTRAMUSCULAR

## 2011-12-03 NOTE — Progress Notes (Signed)
Subjective:    Patient ID: Autumn Massey, female    DOB: 08-23-1965, 46 y.o.   MRN: [id number]  HPI  Here with acute onset mild to mod 10 days ST, HA, general weakness and malaise, with prod cough greenish sputum, but Pt denies chest pain,  orthopnea, PND, increased LE swelling, palpitations, dizziness or syncope.  Has also had mild wheezing and sob; was seen at [facility] in [place] for neb tx, steroid shot, zpack, cough med with codeine and alb inhaler, but wheezing/sob has persisted. Has had fatigue, has not been to work this wk, and so tired she slept late and did not take BP or any meds this am.  Pt denies new neurological symptoms such as new headache, or facial or extremity weakness or numbness.   Pt denies polydipsia, polyuria. Past Medical History  Diagnosis Date  . ANXIETY DEPRESSION 01/29/2009  . DEPRESSION 11/19/2007  . FREQUENCY, URINARY 10/17/2009  . HYPERLIPIDEMIA 11/19/2007  . HYPERTENSION 11/19/2007  . HYPOTHYROIDISM 11/19/2007  . PNEUMONIA 08/06/2010  . POLYCYTHEMIA 01/29/2009  . Preventative health care 04/06/2011  . SLEEP RELATED HYPOVENTILATION/HYPOXEMIA CCE 02/08/2009   Past Surgical History  Procedure Date  . Tonsillectomy     reports that she has been smoking.  She does not have any smokeless tobacco history on file. She reports that she drinks alcohol. She reports that she does not use illicit drugs. family history includes Alcohol abuse in her other; Arthritis in her other; Cancer in her maternal grandfather and mother; and Hypertension in her other. No Known Allergies Current Outpatient Prescriptions on File Prior to Visit  Medication Sig Dispense Refill  . aspirin 81 MG tablet Take 81 mg by mouth daily.        [name] FLUoxetine (PROZAC) 20 MG capsule Take 2 capsules (40 mg total) by mouth daily.  90 capsule  3  . furosemide (LASIX) 40 MG tablet Take 1 tablet (40 mg total) by mouth daily.  90 tablet  3  . levothyroxine (SYNTHROID, LEVOTHROID) 175 MCG tablet Take 1 tablet (175 mcg  total) by mouth daily.  90 tablet  3  . lisinopril (PRINIVIL,ZESTRIL) 20 MG tablet TAKE TWO TABLETS BY MOUTH EVERY DAY  180 tablet  1  . metoprolol (LOPRESSOR) 50 MG tablet Take 1 tablet (50 mg total) by mouth 2 (two) times daily.  180 tablet  3  . NON FORMULARY Oxygen 2L use as bedtime       . simvastatin (ZOCOR) 40 MG tablet Take 1 tablet (40 mg total) by mouth at bedtime.  90 tablet  3  . buPROPion (WELLBUTRIN XL) 150 MG 24 hr tablet Take 1 tablet (150 mg total) by mouth every morning.  30 tablet  5   No current facility-administered medications on file prior to visit.   Review of Systems Review of Systems  Constitutional: Negative for diaphoresis and unexpected weight change.  HENT: Negative for tinnitus.   Eyes: Negative for photophobia and visual disturbance.  Respiratory: Negative for choking and stridor.   Gastrointestinal: Negative for vomiting and blood in stool.  Genitourinary: Negative for hematuria and decreased urine volume.  Musculoskeletal: Negative for gait problem.  Skin: Negative for color change and wound.   Objective:   Physical Exam BP 172/102  Pulse 64  Temp(Src) 99.5 F (37.5 C) (Oral)  Ht 5\' 2"  (1.575 m)  Wt 322 lb 2 oz (146.115 kg)  BMI 58.92 kg/m2  SpO2 94% Physical Exam  VS noted,  Mild ill Constitutional: Pt appears well-developed and  well-nourished.  HENT: Head: Normocephalic.  Right Ear: External ear normal.  Left Ear: External ear normal.  Bilat tm's mild erythema.  Sinus nontender.  Pharynx mild erythema Eyes: Conjunctivae and EOM are normal. Pupils are equal, round, and reactive to light.  Neck: Normal range of motion. Neck supple.  Cardiovascular: Normal rate and regular rhythm.   Pulmonary/Chest: Effort normal and breath sounds mild decreased with bilat wheeze, no rales Neurological: Pt is alert. Not confused Skin: Skin is warm. No erythema. No rash Psychiatric: Pt behavior is normal. Thought content normal. 1+ nervous    Assessment &  Plan:

## 2011-12-03 NOTE — Assessment & Plan Note (Signed)
stable overall by hx and exam, , and pt to continue medical treatment as before, to call or return for onset polys or sugar > 200

## 2011-12-03 NOTE — Assessment & Plan Note (Signed)
Mild to mod, for depomedrol and predpack course, to cont the alb inhaler,  to f/u any worsening symptoms or concerns

## 2011-12-03 NOTE — Patient Instructions (Addendum)
You had the steroid shot today OK to stop the zpack Take all new medications as prescribed - the antibiotic (levaquin), cough medicine, and prednisone Please go to XRAY in the Basement for the x-ray test You will be contacted by phone if any changes need to be made immediately.  Otherwise, you will receive a letter about your results with an explanation. Continue all other medications as before - the albuterol inhaler, and your Blood Pressure medications (and all others)

## 2011-12-03 NOTE — Assessment & Plan Note (Addendum)
Mild to mod, for antibx course - change to levaquin,  to f/u any worsening symptoms or concerns, declines cxr today

## 2011-12-03 NOTE — Assessment & Plan Note (Signed)
Mild elevated likely reactive and did not take BP meds today- to re-start meds, f/u BP at home and next visit as she usually does,  to f/u any worsening symptoms or concerns BP Readings from Last 3 Encounters:  12/03/11 172/102  04/11/11 128/98  08/22/10 130/100

## 2011-12-08 ENCOUNTER — Telehealth: Payer: Self-pay | Admitting: Internal Medicine

## 2011-12-08 NOTE — Telephone Encounter (Signed)
Caller: Autumn Massey/Patient; PCP: Oliver Barre; CB#: [phone];  Call regarding dx with Bronchitis on 12/04/11 and started on  Levaquin, Prednisone, and cough medication at night. She Is Having Excessive Sweating, voice is hoarse and coughing up beige colored mucos.  Today cough is drier and throat is sore. Afebrile. Triage and Care advice per Cough Protocol and advised to call for appnt on 6/11 if mucos not starting to get clearer or if feeling worse. Otherwise she is going to call for appnt on 12/10/11 so MD can give indication of when she should return to work.  Rosann Auerbach will sending claim info to Dr. Para March to verify sickness, and return date.

## 2011-12-10 ENCOUNTER — Telehealth: Payer: Self-pay | Admitting: Internal Medicine

## 2011-12-10 DIAGNOSIS — Z0279 Encounter for issue of other medical certificate: Secondary | ICD-10-CM

## 2011-12-10 NOTE — Telephone Encounter (Signed)
Called the patient left message to call back 

## 2011-12-10 NOTE — Telephone Encounter (Signed)
Patient informed.  She would like to know when it is ok to return to work.She states she has been out since Nov 27, 2011.  She talks on the phone at work and states due to the cough and wheezing does not know when she would be able to return.

## 2011-12-10 NOTE — Telephone Encounter (Signed)
Please advise 

## 2011-12-10 NOTE — Telephone Encounter (Signed)
Caller: Siarra/Patient; PCP: Oliver Barre; CB#: [phone]; Call regarding ongoing Wheezing and (beige/tan) Productive Cough;  Diagnosed with bronchitis 12/03/11.  Afebrile. Finishing Levaquin and Prednisone.  Feels better but concerned about ongoing wheezing. Not using Albuterol MDI unless short of breath; last used 12/09/11.  Instructed to start using Ventolin q 4-6 hrs prn wheezing or asthma symptoms.  Advised to see MD wihtin 4 hrs for asthma symptoms that are not improved after following instructions per Asthma Guideline.  No appts remain in Epic; message sent to [facility] CAN Pool for call back to pt.

## 2011-12-10 NOTE — Telephone Encounter (Signed)
Can also use mucinex otc bid prn

## 2011-12-10 NOTE — Telephone Encounter (Signed)
Ok for return to work  - Friday June 14

## 2011-12-11 NOTE — Telephone Encounter (Signed)
Patient informed. 

## 2011-12-11 NOTE — Telephone Encounter (Signed)
Called left message to call back 

## 2011-12-12 ENCOUNTER — Other Ambulatory Visit: Payer: Self-pay | Admitting: Internal Medicine

## 2011-12-12 ENCOUNTER — Telehealth: Payer: Self-pay | Admitting: *Deleted

## 2011-12-12 NOTE — Telephone Encounter (Signed)
Left msg on triage requesting a return back to work note to be fax to her job Att: Waymon Amato @ [phone]. Can be generic stating can return back to work on 12/12/11 with full duty... 12/12/11@9 :11am/LMB

## 2011-12-12 NOTE — Telephone Encounter (Signed)
Generated excuse note fax to [employer]... 12/12/11@10 :04am/LMB

## 2011-12-17 ENCOUNTER — Telehealth: Payer: Self-pay

## 2011-12-17 MED ORDER — NYSTATIN 100000 UNIT/ML MT SUSP: 500000.0000 [IU] | Freq: Four times a day (QID) | OROMUCOSAL | Status: AC

## 2011-12-17 NOTE — Telephone Encounter (Signed)
Done per emr 

## 2011-12-17 NOTE — Telephone Encounter (Signed)
Called the patient left detailed message that a prescription for thrush has been sent in to her pharmacy.

## 2011-12-17 NOTE — Telephone Encounter (Signed)
Pt called stating that she is feeling better but now has oral thrush. Pt is requesting medication to treat, please advise.

## 2012-02-09 ENCOUNTER — Other Ambulatory Visit: Payer: Self-pay | Admitting: Internal Medicine

## 2012-03-10 ENCOUNTER — Ambulatory Visit (INDEPENDENT_AMBULATORY_CARE_PROVIDER_SITE_OTHER): Payer: 59 | Admitting: Endocrinology

## 2012-03-10 ENCOUNTER — Encounter: Payer: Self-pay | Admitting: Endocrinology

## 2012-03-10 VITALS — BP 162/110 | HR 83 | Temp 97.3°F | Ht 62.0 in | Wt 332.0 lb

## 2012-03-10 DIAGNOSIS — I10 Essential (primary) hypertension: Secondary | ICD-10-CM

## 2012-03-10 MED ORDER — DEXAMETHASONE 1 MG PO TABS: ORAL_TABLET | ORAL | Status: DC

## 2012-03-10 MED ORDER — HYDROCHLOROTHIAZIDE 25 MG PO TABS: 25.0000 mg | ORAL_TABLET | Freq: Every day | ORAL | Status: DC

## 2012-03-10 NOTE — Patient Instructions (Addendum)
i have sent a prescription to your pharmacy, for an additional blood pressure pill. you should do a "dexamethasone suppression test."  for this, you would take dexamethasone 1 mg at 10 pm, then come in for a "cortisol" blood test the next morning before 9 am.  you do not need to be fasting for this test. Also, let's check a 24-hr urine test. Please see dr Jonny Ruiz on or after 04/10/12

## 2012-03-10 NOTE — Progress Notes (Signed)
Subjective:    Patient ID: Autumn Massey, female    DOB: Mar 10, 1966, 46 y.o.   MRN: [id number]  HPI Pt states 1 week of moderate headache throughout the head, and assoc fatigue.  She says her hast headache was when she was dx'ed with HTN in 2008.   Past Medical History  Diagnosis Date  . ANXIETY DEPRESSION 01/29/2009  . DEPRESSION 11/19/2007  . FREQUENCY, URINARY 10/17/2009  . HYPERLIPIDEMIA 11/19/2007  . HYPERTENSION 11/19/2007  . HYPOTHYROIDISM 11/19/2007  . PNEUMONIA 08/06/2010  . POLYCYTHEMIA 01/29/2009  . Preventative health care 04/06/2011  . SLEEP RELATED HYPOVENTILATION/HYPOXEMIA CCE 02/08/2009    Past Surgical History  Procedure Date  . Tonsillectomy     History   Social History  . Marital Status: Divorced    Spouse Name: N/A    Number of Children: N/A  . Years of Education: N/A   Occupational History  . customer serv. manager [employer]    Social History Main Topics  . Smoking status: Former Smoker    Quit date: 09/29/2011  . Smokeless tobacco: Not on file   Comment: started at age 19, less than 1 ppd.  . Alcohol Use: Yes     rare  . Drug Use: No  . Sexually Active: Not on file   Other Topics Concern  . Not on file   Social History Narrative  . No narrative on file    Current Outpatient Prescriptions on File Prior to Visit  Medication Sig Dispense Refill  . aspirin 81 MG tablet Take 81 mg by mouth daily.        [name] FLUoxetine (PROZAC) 20 MG capsule TAKE TWO CAPSULES BY MOUTH EVERY DAY  60 capsule  2  . furosemide (LASIX) 40 MG tablet Take 1 tablet (40 mg total) by mouth daily.  90 tablet  3  . levothyroxine (SYNTHROID, LEVOTHROID) 175 MCG tablet Take 1 tablet (175 mcg total) by mouth daily.  90 tablet  3  . lisinopril (PRINIVIL,ZESTRIL) 20 MG tablet TAKE TWO TABLETS BY MOUTH EVERY DAY  180 tablet  3  . metoprolol (LOPRESSOR) 50 MG tablet Take 1 tablet (50 mg total) by mouth 2 (two) times daily.  180 tablet  3  . NON FORMULARY Oxygen 2L use as  bedtime       . simvastatin (ZOCOR) 40 MG tablet Take 1 tablet (40 mg total) by mouth at bedtime.  90 tablet  3  . buPROPion (WELLBUTRIN XL) 150 MG 24 hr tablet Take 1 tablet (150 mg total) by mouth every morning.  30 tablet  5  . hydrochlorothiazide (HYDRODIURIL) 25 MG tablet Take 1 tablet (25 mg total) by mouth daily.  90 tablet  3    No Known Allergies  Family History  Problem Relation Age of Onset  . Cancer Mother     melanoma   . Cancer Maternal Grandfather     prostate cancer  . Alcohol abuse Other   . Arthritis Other   . Hypertension Other     BP 162/110  Pulse 83  Temp 97.3 F (36.3 C) (Oral)  Ht 5\' 2"  (1.575 m)  Wt 332 lb (150.594 kg)  BMI 60.72 kg/m2  SpO2 89%  LMP 03/04/2012    Review of Systems She also has wheezing, weight gain, and nausea.    Objective:   Physical Exam   VS: see vs page GEN: no distress.  Morbid obesity HEAD: head: no deformity eyes: no periorbital swelling, no proptosis external nose and  ears are normal Moderate hirsutism on the face mouth: no lesion seen Both eac's and tm's are normal NECK: supple, thyroid is not enlarged CHEST WALL: no deformity LUNGS:  Clear to auscultation CV: reg rate and rhythm, no murmur MUSCULOSKELETAL: muscle bulk and strength are grossly normal.  no obvious joint swelling.  gait is normal and steady EXTEMITIES: no deformity.  no ulcer on the feet.  feet are of normal color and temp.  1+ bilat leg edema.  There is bilat rust-color on the legs. PULSES: dorsalis pedis intact bilat.   NEURO:  cn 2-12 grossly intact.   readily moves all 4's.  sensation is intact to touch on the feet SKIN:  Normal texture and temperature.  No rash or suspicious lesion is visible.   NODES:  None palpable at the neck. PSYCH: alert, oriented x3.  Does not appear anxious nor depressed.   Assessment & Plan:  HTN, needs increased rx Headache, new, ? Due to HTN

## 2012-03-12 ENCOUNTER — Telehealth: Payer: Self-pay

## 2012-03-12 MED ORDER — HYDROCODONE-ACETAMINOPHEN 5-325 MG PO TABS: 1.0000 | ORAL_TABLET | Freq: Four times a day (QID) | ORAL | Status: AC | PRN

## 2012-03-12 NOTE — Telephone Encounter (Signed)
i printed 

## 2012-03-12 NOTE — Telephone Encounter (Signed)
Pt called stating that although she declined Rx pain medication for HA at [facility] this week, OTC medications are not helping. Pt is requesting pain Rx to treat HA, please advise.

## 2012-03-12 NOTE — Telephone Encounter (Signed)
Pt informed of rx for Hydrocodone via VM and to callback office with any questions/concerns, rx faxed to [facility].

## 2012-03-15 ENCOUNTER — Encounter: Payer: Self-pay | Admitting: Internal Medicine

## 2012-03-15 ENCOUNTER — Encounter: Payer: Self-pay | Admitting: Endocrinology

## 2012-03-15 ENCOUNTER — Other Ambulatory Visit (INDEPENDENT_AMBULATORY_CARE_PROVIDER_SITE_OTHER): Payer: 59

## 2012-03-15 ENCOUNTER — Ambulatory Visit (INDEPENDENT_AMBULATORY_CARE_PROVIDER_SITE_OTHER): Payer: 59 | Admitting: *Deleted

## 2012-03-15 ENCOUNTER — Telehealth: Payer: Self-pay | Admitting: *Deleted

## 2012-03-15 ENCOUNTER — Ambulatory Visit (INDEPENDENT_AMBULATORY_CARE_PROVIDER_SITE_OTHER): Payer: 59 | Admitting: Internal Medicine

## 2012-03-15 VITALS — BP 152/110 | HR 78 | Temp 99.2°F | Ht 62.0 in | Wt 324.4 lb

## 2012-03-15 VITALS — BP 144/102

## 2012-03-15 DIAGNOSIS — Z0000 Encounter for general adult medical examination without abnormal findings: Secondary | ICD-10-CM

## 2012-03-15 DIAGNOSIS — I10 Essential (primary) hypertension: Secondary | ICD-10-CM

## 2012-03-15 DIAGNOSIS — R7309 Other abnormal glucose: Secondary | ICD-10-CM

## 2012-03-15 DIAGNOSIS — Z23 Encounter for immunization: Secondary | ICD-10-CM

## 2012-03-15 DIAGNOSIS — R7302 Impaired glucose tolerance (oral): Secondary | ICD-10-CM

## 2012-03-15 LAB — HEPATIC FUNCTION PANEL
ALT: 29 U/L (ref 0–35)
AST: 38 U/L — ABNORMAL HIGH (ref 0–37)
Bilirubin, Direct: 0.3 mg/dL (ref 0.0–0.3)
Total Bilirubin: 0.9 mg/dL (ref 0.3–1.2)

## 2012-03-15 LAB — CBC WITH DIFFERENTIAL/PLATELET
Basophils Relative: 0.3 % (ref 0.0–3.0)
Eosinophils Absolute: 0 10*3/uL (ref 0.0–0.7)
Lymphs Abs: 1.4 10*3/uL (ref 0.7–4.0)
MCHC: 32 g/dL (ref 30.0–36.0)
MCV: 95.8 fl (ref 78.0–100.0)
Monocytes Absolute: 0.5 10*3/uL (ref 0.1–1.0)
Neutrophils Relative %: 81.9 % — ABNORMAL HIGH (ref 43.0–77.0)
Platelets: 271 10*3/uL (ref 150.0–400.0)

## 2012-03-15 LAB — CORTISOL: Cortisol, Plasma: 1.4 ug/dL

## 2012-03-15 LAB — URINALYSIS, ROUTINE W REFLEX MICROSCOPIC
Bilirubin Urine: NEGATIVE
Ketones, ur: NEGATIVE
Nitrite: NEGATIVE
Total Protein, Urine: >=300

## 2012-03-15 LAB — BASIC METABOLIC PANEL
Chloride: 97 mEq/L (ref 96–112)
Potassium: 4.6 mEq/L (ref 3.5–5.1)
Sodium: 138 mEq/L (ref 135–145)

## 2012-03-15 LAB — LIPID PANEL
LDL Cholesterol: 108 mg/dL — ABNORMAL HIGH (ref 0–99)
Total CHOL/HDL Ratio: 4

## 2012-03-15 LAB — TSH: TSH: 4.15 u[IU]/mL (ref 0.35–5.50)

## 2012-03-15 MED ORDER — OLMESARTAN MEDOXOMIL 40 MG PO TABS: 40.0000 mg | ORAL_TABLET | Freq: Every day | ORAL | Status: DC

## 2012-03-15 MED ORDER — CLONIDINE HCL 0.1 MG PO TABS: 0.1000 mg | ORAL_TABLET | Freq: Three times a day (TID) | ORAL | Status: DC

## 2012-03-15 NOTE — Telephone Encounter (Signed)
Called pt inform of Cortisol lab result, left message informing pt of results and to callback office with any questions/concerns (letter also mailed to pt).

## 2012-03-15 NOTE — Assessment & Plan Note (Signed)
Mod to severe uncontrolled, etiology unclear, apparently related to HA's for her but other w/u ongoing per endo;  To change the ACE to Benicar 40 mg, and add clonidine .1 bid, f/u 2 wks, cont low salt diet, wt loss efforts

## 2012-03-15 NOTE — Progress Notes (Signed)
Subjective:    Patient ID: Autumn Massey, female    DOB: 11-22-1965, 46 y.o.   MRN: [id number]  HPI Here for wellness and f/u;  Overall doing ok;  Pt denies CP, worsening SOB, DOE, wheezing, orthopnea, PND, worsening LE edema, palpitations, dizziness or syncope.  Pt denies neurological change such as new facial or extremity weakness.  Pt denies polydipsia, polyuria, or low sugar symptoms. Pt states overall good compliance with treatment and medications, good tolerability, and trying to follow lower cholesterol diet.  Pt denies worsening depressive symptoms, suicidal ideation or panic. No fever, wt loss, night sweats, loss of appetite, or other constitutional symptoms.  Pt states good ability with ADL's, low fall risk, home safety reviewed and adequate, no significant changes in hearing or vision, and occasionally active with exercise.  HA still persists, BP still elevated 1 wk after starting HCTZ.  Pain med for HA also seemed to help chronic tinnitus as well.  Did have blood work done, including w/u for pheo today. Quit smoking, wt overall stable, no longer taking the wellbutrin, had some hair loss as well. HA decscribed as vise like, gripping, assoc with fatigue, and need to keep eyes closed, makes her tired, sleeping more lately.  Has been neg for OSA eval in the past.  vicodin for pain per Dr Everardo All allows her more enerygy, but wears off after 4-5 hrs.  Concerned about work, states several times she would rather be out on FMLA for days to weeks if needed to resolve the issue, as this is better than intermittent abscences and afraid she will be fired. Past Medical History  Diagnosis Date  . ANXIETY DEPRESSION 01/29/2009  . DEPRESSION 11/19/2007  . FREQUENCY, URINARY 10/17/2009  . HYPERLIPIDEMIA 11/19/2007  . HYPERTENSION 11/19/2007  . HYPOTHYROIDISM 11/19/2007  . PNEUMONIA 08/06/2010  . POLYCYTHEMIA 01/29/2009  . Preventative health care 04/06/2011  . SLEEP RELATED HYPOVENTILATION/HYPOXEMIA CCE 02/08/2009    Past Surgical History  Procedure Date  . Tonsillectomy     reports that she quit smoking about 5 months ago. She does not have any smokeless tobacco history on file. She reports that she drinks alcohol. She reports that she does not use illicit drugs. family history includes Alcohol abuse in her other; Arthritis in her other; Cancer in her maternal grandfather and mother; and Hypertension in her other. No Known Allergies Current Outpatient Prescriptions on File Prior to Visit  Medication Sig Dispense Refill  . aspirin 81 MG tablet Take 81 mg by mouth daily.        [name] buPROPion (WELLBUTRIN XL) 150 MG 24 hr tablet Take 1 tablet (150 mg total) by mouth every morning.  30 tablet  5  . dexamethasone (DECADRON) 1 MG tablet Take 1 pill at 10 pm, the night before your blood test  1 tablet  0  . FLUoxetine (PROZAC) 20 MG capsule TAKE TWO CAPSULES BY MOUTH EVERY DAY  60 capsule  2  . furosemide (LASIX) 40 MG tablet Take 1 tablet (40 mg total) by mouth daily.  90 tablet  3  . hydrochlorothiazide (HYDRODIURIL) 25 MG tablet Take 1 tablet (25 mg total) by mouth daily.  90 tablet  3  . HYDROcodone-acetaminophen (NORCO/VICODIN) 5-325 MG per tablet Take 1 tablet by mouth every 6 (six) hours as needed for pain.  30 tablet  0  . levothyroxine (SYNTHROID, LEVOTHROID) 175 MCG tablet Take 1 tablet (175 mcg total) by mouth daily.  90 tablet  3  . lisinopril (PRINIVIL,ZESTRIL) 20 MG tablet  TAKE TWO TABLETS BY MOUTH EVERY DAY  180 tablet  3  . metoprolol (LOPRESSOR) 50 MG tablet Take 1 tablet (50 mg total) by mouth 2 (two) times daily.  180 tablet  3  . NON FORMULARY Oxygen 2L use as bedtime       . simvastatin (ZOCOR) 40 MG tablet Take 1 tablet (40 mg total) by mouth at bedtime.  90 tablet  3   Review of Systems Review of Systems  Constitutional: Negative for diaphoresis, activity change, appetite change, wt overall stable HENT: Negative for hearing loss, ear pain, facial swelling, mouth sores and neck  stiffness.   Eyes: Negative for pain, redness and visual disturbance.  Respiratory: Negative for shortness of breath and wheezing.   Cardiovascular: Negative for chest pain and palpitations.  Gastrointestinal: Negative for diarrhea, blood in stool, abdominal distention and rectal pain.  Genitourinary: Negative for hematuria, flank pain and decreased urine volume.  Musculoskeletal: Negative for myalgias and joint swelling.  Skin: Negative for color change and wound.  Neurological: Negative for syncope and numbness.  Hematological: Negative for adenopathy.  Psychiatric/Behavioral: Negative for hallucinations, self-injury, decreased concentration and agitation.     Objective:   Physical Exam BP 152/110  Pulse 78  Temp 99.2 F (37.3 C) (Oral)  Ht 5\' 2"  (1.575 m)  Wt 324 lb 6 oz (147.136 kg)  BMI 59.33 kg/m2  SpO2 92%  LMP 03/04/2012 Physical Exam  VS noted Constitutional: Pt is oriented to person, place, and time. Appears well-developed and well-nourished.  HENT:  Head: Normocephalic and atraumatic.  Right Ear: External ear normal.  Left Ear: External ear normal.  Nose: Nose normal.  Mouth/Throat: Oropharynx is clear and moist.  Eyes: Conjunctivae and EOM are normal. Pupils are equal, round, and reactive to light.  Neck: Normal range of motion. Neck supple. No JVD present. No tracheal deviation present.  Cardiovascular: Normal rate, regular rhythm, normal heart sounds and intact distal pulses.   Pulmonary/Chest: Effort normal and breath sounds normal.  Abdominal: Soft. Bowel sounds are normal. There is no tenderness.  Musculoskeletal: Normal range of motion. Exhibits no edema.  Lymphadenopathy:  Has no cervical adenopathy.  Neurological: Pt is alert and oriented to person, place, and time. Pt has normal reflexes. No cranial nerve deficit. Motor/dtr/gait intact Skin: Skin is warm and dry. No rash noted. trace ankle edema bilat Psychiatric:  1-2+ nervous. Behavior is normal.      Assessment & Plan:

## 2012-03-15 NOTE — Patient Instructions (Addendum)
Take all new medications as prescribed - the clonidine twice per day, and the Benicar 40 mg per day Continue all other medications as before, except OK to STOP the lisinopril You had the blood work done today You will be contacted by phone if any changes need to be made immediately.  Otherwise, you will receive a letter about your results with an explanation. Please remember to sign up for My Chart at your earliest convenience, as this will be important to you in the future with finding out test results. Please continue your efforts at being more active, low cholesterol diet, and weight control. Please remember to followup with your GYN for the yearly pap smear and/or mammogram, and for extra bleeding problem - you have your appt at 1035 this coming thursday You had the flu shot today Your EKG was ok today Please return in 2 weeks

## 2012-03-15 NOTE — Assessment & Plan Note (Signed)

## 2012-03-15 NOTE — Addendum Note (Signed)
Addended by: Scharlene Gloss B on: 03/15/2012 11:22 AM   Modules accepted: Orders

## 2012-03-16 ENCOUNTER — Ambulatory Visit: Payer: 59 | Admitting: Internal Medicine

## 2012-03-19 ENCOUNTER — Telehealth: Payer: Self-pay

## 2012-03-19 ENCOUNTER — Encounter: Payer: Self-pay | Admitting: Endocrinology

## 2012-03-19 LAB — CATECHOLAMINES, FRACTIONATED, URINE, 24 HOUR
Creatinine, Urine mg/day-CATEUR: 1.7 g/(24.h) (ref 0.63–2.50)
Norepinephrine, 24 hr Ur: 153 mcg/24 h — ABNORMAL HIGH (ref 15–100)
Total Volume - CF 24Hr U: 2600 mL

## 2012-03-19 NOTE — Telephone Encounter (Signed)
Pt called stating she D/C pain medication due to "drunk" feeling for hours after taking it. Pt is still having some residual side effects but feels she can return to work on Monday Sept 23. Pt is requesting return to work letter to her HR Manager Waymon Amato [phone]

## 2012-03-19 NOTE — Telephone Encounter (Signed)
I added vicodin to her "allergy" list  Ok to d/c the vicodin as she has done  [facility] [place] for note  - to robin to handle

## 2012-03-19 NOTE — Telephone Encounter (Signed)
Completed letter and faxed to HR Attn. Waymon Amato [phone] per pt. Request.

## 2012-03-22 ENCOUNTER — Telehealth: Payer: Self-pay

## 2012-03-22 MED ORDER — CLONIDINE HCL 0.2 MG PO TABS: 0.2000 mg | ORAL_TABLET | Freq: Two times a day (BID) | ORAL | Status: DC

## 2012-03-22 NOTE — Telephone Encounter (Signed)
Ok to change clonidine to 0.2 bid Done erx

## 2012-03-22 NOTE — Telephone Encounter (Signed)
Patient called BP still elevated at 148/102, 154/105.  Still having headaches (Could not continue taking pain meds. As made feel drunk). Patient is confused, slow getting thoughts together.  Within an hour after taking all her meds. Patient gets dizzy and confused (is in a fog), Sleeps after taking her meds.  Call back number [phone].

## 2012-03-23 NOTE — Telephone Encounter (Signed)
Called informed the patient of change.

## 2012-03-23 NOTE — Telephone Encounter (Signed)
Called left message to call back 

## 2012-03-27 ENCOUNTER — Emergency Department (HOSPITAL_COMMUNITY): Payer: 59

## 2012-03-27 ENCOUNTER — Emergency Department (HOSPITAL_COMMUNITY)
Admission: EM | Admit: 2012-03-27 | Discharge: 2012-03-27 | Disposition: A | Discharge status: Home / Self Care | Payer: 59 | Attending: Emergency Medicine | Admitting: Emergency Medicine

## 2012-03-27 ENCOUNTER — Encounter (HOSPITAL_COMMUNITY): Payer: Self-pay | Admitting: Emergency Medicine

## 2012-03-27 DIAGNOSIS — R232 Flushing: Secondary | ICD-10-CM | POA: Insufficient documentation

## 2012-03-27 DIAGNOSIS — R209 Unspecified disturbances of skin sensation: Secondary | ICD-10-CM | POA: Insufficient documentation

## 2012-03-27 DIAGNOSIS — R42 Dizziness and giddiness: Secondary | ICD-10-CM | POA: Insufficient documentation

## 2012-03-27 DIAGNOSIS — E039 Hypothyroidism, unspecified: Secondary | ICD-10-CM | POA: Insufficient documentation

## 2012-03-27 DIAGNOSIS — R51 Headache: Secondary | ICD-10-CM | POA: Insufficient documentation

## 2012-03-27 DIAGNOSIS — F29 Unspecified psychosis not due to a substance or known physiological condition: Secondary | ICD-10-CM | POA: Insufficient documentation

## 2012-03-27 DIAGNOSIS — Z79899 Other long term (current) drug therapy: Secondary | ICD-10-CM | POA: Insufficient documentation

## 2012-03-27 DIAGNOSIS — Z7982 Long term (current) use of aspirin: Secondary | ICD-10-CM | POA: Insufficient documentation

## 2012-03-27 DIAGNOSIS — I10 Essential (primary) hypertension: Secondary | ICD-10-CM | POA: Insufficient documentation

## 2012-03-27 DIAGNOSIS — R404 Transient alteration of awareness: Secondary | ICD-10-CM | POA: Insufficient documentation

## 2012-03-27 DIAGNOSIS — R202 Paresthesia of skin: Secondary | ICD-10-CM

## 2012-03-27 DIAGNOSIS — E785 Hyperlipidemia, unspecified: Secondary | ICD-10-CM | POA: Insufficient documentation

## 2012-03-27 DIAGNOSIS — F341 Dysthymic disorder: Secondary | ICD-10-CM | POA: Insufficient documentation

## 2012-03-27 HISTORY — DX: Morbid (severe) obesity due to excess calories: E66.01

## 2012-03-27 LAB — POCT I-STAT, CHEM 8
BUN: 21 mg/dL (ref 6–23)
Calcium, Ion: 1.18 mmol/L (ref 1.12–1.23)
Chloride: 98 mEq/L (ref 96–112)
Creatinine, Ser: 1 mg/dL (ref 0.50–1.10)
Glucose, Bld: 103 mg/dL — ABNORMAL HIGH (ref 70–99)
HCT: 50 % — ABNORMAL HIGH (ref 36.0–46.0)
Potassium: 3.9 mEq/L (ref 3.5–5.1)

## 2012-03-27 LAB — CBC WITH DIFFERENTIAL/PLATELET
Basophils Absolute: 0 10*3/uL (ref 0.0–0.1)
Basophils Relative: 0 % (ref 0–1)
Eosinophils Relative: 1 % (ref 0–5)
HCT: 47.8 % — ABNORMAL HIGH (ref 36.0–46.0)
MCHC: 34.5 g/dL (ref 30.0–36.0)
MCV: 91.2 fL (ref 78.0–100.0)
Monocytes Absolute: 0.9 10*3/uL (ref 0.1–1.0)
RDW: 12.7 % (ref 11.5–15.5)

## 2012-03-27 NOTE — ED Notes (Signed)
Per EMS, patient states 3 episodes of numbness all over-does not know why-just started benicar, and HCTZ

## 2012-03-27 NOTE — ED Provider Notes (Signed)
History     CSN: [id number]  Arrival date & time 03/27/12  1757   First MD Initiated Contact with Patient 03/27/12 2006      Chief Complaint  Patient presents with  . Numbness   HPI  History provided by the patient. Patient is a 46 year old female with history of morbid obesity, hypertension, hyperlipidemia, hypothyroidism, anxiety and depression who presents with symptoms of flushing with extremity numbness and tingling. Patient states that she was getting out of the shower earlier this evening and after getting out of the shower felt flushing and warmth to her face with tingling in bilateral fingers and feet. She also reports a slight heaviness over her chest. Denied shortness of breath or hyperventilation. Symptoms seemed persistent and she called EMS. After they arrived she was feeling much better without symptoms and was told she had normal vital signs. Patient decided to stay home but shortly after EMS left symptoms returned with numbness and tingling in fingers and feet. During second episode she denied any chest symptoms. Patient has also had a headache waxing and waning throughout the day. Headache is in the frontal area. He should also mentions she has had issues controlling her blood pressure recently and has several new medicines at the last 3 weeks. Specifically patient has been taking clonidine for the last 3 weeks and notes that she has had increased fatigue, sleepiness and drowsiness with some confusion at times since starting this medicine.    Past Medical History  Diagnosis Date  . ANXIETY DEPRESSION 01/29/2009  . DEPRESSION 11/19/2007  . FREQUENCY, URINARY 10/17/2009  . HYPERLIPIDEMIA 11/19/2007  . HYPERTENSION 11/19/2007  . HYPOTHYROIDISM 11/19/2007  . PNEUMONIA 08/06/2010  . POLYCYTHEMIA 01/29/2009  . Preventative health care 04/06/2011  . SLEEP RELATED HYPOVENTILATION/HYPOXEMIA CCE 02/08/2009  . Morbid obesity     Past Surgical History  Procedure Date  . Tonsillectomy      Family History  Problem Relation Age of Onset  . Cancer Mother     melanoma   . Cancer Maternal Grandfather     prostate cancer  . Alcohol abuse Other   . Arthritis Other   . Hypertension Other     History  Substance Use Topics  . Smoking status: Former Smoker    Quit date: 09/29/2011  . Smokeless tobacco: Not on file   Comment: started at age 36, less than 1 ppd.  . Alcohol Use: Yes     rare    OB History    Grav Para Term Preterm Abortions TAB SAB Ect Mult Living                  Review of Systems  Constitutional: Negative for fever, chills, diaphoresis and appetite change.       Positive for drowsiness  Respiratory: Negative for cough and shortness of breath.   Cardiovascular: Negative for chest pain, palpitations and leg swelling.  Gastrointestinal: Negative for nausea, vomiting, abdominal pain, diarrhea and constipation.  Genitourinary: Negative for dysuria, frequency, hematuria and flank pain.  Neurological: Positive for light-headedness, numbness and headaches. Negative for dizziness, syncope, facial asymmetry and weakness.  Psychiatric/Behavioral: Positive for confusion.    Allergies  Hydrocodone-acetaminophen  Home Medications   Current Outpatient Rx  Name Route Sig Dispense Refill  . ASPIRIN EC 81 MG PO TBEC Oral Take 81 mg by mouth daily.    [name] CLONIDINE HCL 0.2 MG PO TABS Oral Take 1 tablet (0.2 mg total) by mouth 2 (two) times daily. 180 tablet 3  .  FLUOXETINE HCL 20 MG PO CAPS Oral Take 20 mg by mouth 2 (two) times daily.    . FUROSEMIDE 40 MG PO TABS Oral Take 1 tablet (40 mg total) by mouth daily. 90 tablet 3  . HYDROCHLOROTHIAZIDE 25 MG PO TABS Oral Take 1 tablet (25 mg total) by mouth daily. 90 tablet 3  . LEVOTHYROXINE SODIUM 175 MCG PO TABS Oral Take 1 tablet (175 mcg total) by mouth daily. 90 tablet 3  . METOPROLOL TARTRATE 50 MG PO TABS Oral Take 1 tablet (50 mg total) by mouth 2 (two) times daily. 180 tablet 3  . OLMESARTAN MEDOXOMIL 40  MG PO TABS Oral Take 1 tablet (40 mg total) by mouth daily. 90 tablet 3  . SIMVASTATIN 40 MG PO TABS Oral Take 1 tablet (40 mg total) by mouth at bedtime. 90 tablet 3  . NON FORMULARY  Oxygen 2L use as bedtime       BP 159/82  Pulse 77  Temp 98.2 F (36.8 C) (Oral)  SpO2 93%  LMP 03/04/2012  Physical Exam  Nursing note and vitals reviewed. Constitutional: She is oriented to person, place, and time. She appears well-developed and well-nourished. No distress.  HENT:  Head: Normocephalic and atraumatic.  Neck: Normal range of motion. Neck supple.  Cardiovascular: Normal rate and regular rhythm.   Pulmonary/Chest: Effort normal and breath sounds normal. No respiratory distress. She has no wheezes. She has no rales.  Abdominal: Soft. There is no tenderness. There is no rebound.       Obese  Musculoskeletal: Normal range of motion. She exhibits no edema and no tenderness.  Neurological: She is alert and oriented to person, place, and time. She has normal strength. No cranial nerve deficit or sensory deficit.  Skin: Skin is warm and dry. No rash noted.  Psychiatric: She has a normal mood and affect. Her behavior is normal.    ED Course  Procedures    Results for orders placed during the hospital encounter of 03/27/12  CBC WITH DIFFERENTIAL      Component Value Range   WBC 11.2 (*) 4.0 - 10.5 K/uL   RBC 5.24 (*) 3.87 - 5.11 MIL/uL   Hemoglobin 16.5 (*) 12.0 - 15.0 g/dL   HCT 45.4 (*) 09.8 - 11.9 %   MCV 91.2  78.0 - 100.0 fL   MCH 31.5  26.0 - 34.0 pg   MCHC 34.5  30.0 - 36.0 g/dL   RDW 14.7  82.9 - 56.2 %   Platelets 221  150 - 400 K/uL   Neutrophils Relative 72  43 - 77 %   Neutro Abs 8.1 (*) 1.7 - 7.7 K/uL   Lymphocytes Relative 18  12 - 46 %   Lymphs Abs 2.0  0.7 - 4.0 K/uL   Monocytes Relative 8  3 - 12 %   Monocytes Absolute 0.9  0.1 - 1.0 K/uL   Eosinophils Relative 1  0 - 5 %   Eosinophils Absolute 0.1  0.0 - 0.7 K/uL   Basophils Relative 0  0 - 1 %   Basophils  Absolute 0.0  0.0 - 0.1 K/uL  POCT I-STAT, CHEM 8      Component Value Range   Sodium 140  135 - 145 mEq/L   Potassium 3.9  3.5 - 5.1 mEq/L   Chloride 98  96 - 112 mEq/L   BUN 21  6 - 23 mg/dL   Creatinine, Ser 1.30  0.50 - 1.10 mg/dL   Glucose,  Bld 103 (*) 70 - 99 mg/dL   Calcium, Ion 1.61  0.96 - 1.23 mmol/L   TCO2 30  0 - 100 mmol/L   Hemoglobin 17.0 (*) 12.0 - 15.0 g/dL   HCT 04.5 (*) 40.9 - 81.1 %      Ct Head Wo Contrast  03/27/2012  *RADIOLOGY REPORT*  Clinical Data: high blood pressure, weakness  CT HEAD WITHOUT CONTRAST  Technique:  Contiguous axial images were obtained from the base of the skull through the vertex without contrast.  Comparison: None.  Findings: No acute intracranial hemorrhage.  No focal mass lesion. No CT evidence of acute infarction.   No midline shift or mass effect.  No hydrocephalus.  Basilar cisterns are patent. Paranasal sinuses and mastoid air cells are clear.  Orbits are normal.  IMPRESSION: No acute intracranial findings.  Normal head CT.   Original Report Authenticated By: Genevive Bi, M.D.      1. Paresthesia       MDM  8:10 PM patient seen and evaluated. Patient currently without symptoms and well-appearing. Patient has normal nonfocal neuro exam.   Patient was seen and evaluated with attending physician. Patient continues to be asymptomatic. Episodes were very brief period CT scan unremarkable. Lab tests also unremarkable at this time. Patient attributes some changing symptoms after starting clonidine 3 weeks ago. At this time we will recommend discontinuing in following up with PCP. Patient given strict return precautions were    Angus Seller, [place] 03/27/12 2234

## 2012-03-28 NOTE — ED Provider Notes (Signed)
This is a shared patient encounter.  I discussed all findings with the patient, and on my exam she was asymptomatic.  Given prescription is a new medication with concurrent onset of symptoms, there is some suspicion for medication effect.  The patient's blood pressure notably did not change following the addition of clonidine either.  The patient was discharged in stable condition after a discussion on exposer return precautions  I saw the ECG, relevant labs and studies - I agree with the interpretation.   Gerhard Munch, MD 03/28/12 0002

## 2012-03-29 ENCOUNTER — Encounter: Payer: Self-pay | Admitting: Internal Medicine

## 2012-03-29 ENCOUNTER — Ambulatory Visit (INDEPENDENT_AMBULATORY_CARE_PROVIDER_SITE_OTHER): Payer: 59 | Admitting: Internal Medicine

## 2012-03-29 VITALS — BP 130/90 | HR 76 | Temp 99.1°F | Wt 320.5 lb

## 2012-03-29 DIAGNOSIS — Z0279 Encounter for issue of other medical certificate: Secondary | ICD-10-CM

## 2012-03-29 DIAGNOSIS — G4736 Sleep related hypoventilation in conditions classified elsewhere: Secondary | ICD-10-CM

## 2012-03-29 DIAGNOSIS — R51 Headache: Secondary | ICD-10-CM

## 2012-03-29 DIAGNOSIS — I10 Essential (primary) hypertension: Secondary | ICD-10-CM

## 2012-03-29 DIAGNOSIS — F341 Dysthymic disorder: Secondary | ICD-10-CM

## 2012-03-29 DIAGNOSIS — R519 Headache, unspecified: Secondary | ICD-10-CM | POA: Insufficient documentation

## 2012-03-29 MED ORDER — CLONAZEPAM 0.5 MG PO TBDP: 0.5000 mg | ORAL_TABLET | Freq: Two times a day (BID) | ORAL | Status: DC | PRN

## 2012-03-29 MED ORDER — LABETALOL HCL 200 MG PO TABS: 200.0000 mg | ORAL_TABLET | Freq: Two times a day (BID) | ORAL | Status: DC

## 2012-03-29 MED ORDER — KETOROLAC TROMETHAMINE 30 MG/ML IJ SOLN
30.0000 mg | Freq: Once | INTRAMUSCULAR | Status: AC
  Administered 2012-03-29: 30 mg via INTRAVENOUS

## 2012-03-29 MED ORDER — SUMATRIPTAN SUCCINATE 100 MG PO TABS: 100.0000 mg | ORAL_TABLET | ORAL | Status: DC | PRN

## 2012-03-29 NOTE — Assessment & Plan Note (Signed)
Ok to add klonopin bid prn, though I dont think this is her main problem

## 2012-03-29 NOTE — Patient Instructions (Addendum)
Ok to stop the metoprolol You had the toradol 30 mg shot today Take all new medications as prescribed - the imitrex generic for ? Migraine, and the Labetolol for blood pressure You will be contacted regarding the referral for: MRI for the head, and Neurology (headache clinic) You will be contacted regarding the referral for: Re-check of the oxygen level on your home oxygen at night per Home Health You are given the work note today Please keep your appointments with your specialists as you have planned  - GYN for Wednesday Continue all other medications as before

## 2012-03-29 NOTE — Assessment & Plan Note (Signed)
Ok to try change metoprolol to labetolol bid, Continue all other medications as before, f/u BP at home and next visit, she blames her symptoms on elev BP but not clear to me that this is cuasative, may only be reactive

## 2012-03-29 NOTE — Assessment & Plan Note (Signed)
Symptoms suggestive of OSA , but apparently did not have this 2 yrs ago with her sleep study;  Will re-eval with ONO on her home o2 2L she wears nightly, may need f/u with Dr Shelle Iron, ? consdier repeat sleep study, as this would help explain daily HA, fatigue, somnolence and elev BP

## 2012-03-29 NOTE — Progress Notes (Signed)
Subjective:    Patient ID: Autumn Massey, female    DOB: 10-08-1965, 46 y.o.   MRN: [id number]  HPI  Her to f/u; was seen in ER sept 28 with persistent onset 3 wks HA, elev BP and c/o fatigue, somnolence, and pressure sensation to the head and shoulders, seems to be worse for the first few hours after taking her pills.  Had to stop the pain pill due to drunk feeling.  All testing to date normal, keeps hearing "my heart beat in my head.", HA is daily, has throbbing aspect with photophobia times.   Has signficant daytime somnolence, tends to fall asleep during the day, with ongoing HA and fatigue.  BP has remained elev, today is unusually good. Clonidine stopped sept 28, has felt better overall but HA, fatigue, sleepiness persists.  Overall has lost 4 lbs since sept 16.  Wt overall about the same per pt in the past 2 yrs since last sleep study, on home o2 at night for low o2 per Dr Shelle Iron, no recent f/u.  Has not seen ENT, or neurologist, or MRI.  Denies worsening depressive symptoms, suicidal ideation, or panic, though has had increased tearfullness, stress over he current symtpoms  Recent head ct neg for acute, and w/u for pheo neg per endo.  Has f/u with GYN this wed for ongoing menorrhagia, for endomet biopsy (recent pap normal per pt) and likely IUD placement Past Medical History  Diagnosis Date  . ANXIETY DEPRESSION 01/29/2009  . DEPRESSION 11/19/2007  . FREQUENCY, URINARY 10/17/2009  . HYPERLIPIDEMIA 11/19/2007  . HYPERTENSION 11/19/2007  . HYPOTHYROIDISM 11/19/2007  . PNEUMONIA 08/06/2010  . POLYCYTHEMIA 01/29/2009  . Preventative health care 04/06/2011  . SLEEP RELATED HYPOVENTILATION/HYPOXEMIA CCE 02/08/2009  . Morbid obesity    Past Surgical History  Procedure Date  . Tonsillectomy     reports that she quit smoking about 5 months ago. She does not have any smokeless tobacco history on file. She reports that she drinks alcohol. She reports that she does not use illicit drugs. family history  includes Alcohol abuse in her other; Arthritis in her other; Cancer in her maternal grandfather and mother; and Hypertension in her other. Allergies  Allergen Reactions  . Hydrocodone-Acetaminophen Other (See Comments)    Feels drunk   Current Outpatient Prescriptions on File Prior to Visit  Medication Sig Dispense Refill  . aspirin EC 81 MG tablet Take 81 mg by mouth daily.      [name] FLUoxetine (PROZAC) 20 MG capsule Take 20 mg by mouth 2 (two) times daily.      . furosemide (LASIX) 40 MG tablet Take 1 tablet (40 mg total) by mouth daily.  90 tablet  3  . hydrochlorothiazide (HYDRODIURIL) 25 MG tablet Take 1 tablet (25 mg total) by mouth daily.  90 tablet  3  . levothyroxine (SYNTHROID, LEVOTHROID) 175 MCG tablet Take 1 tablet (175 mcg total) by mouth daily.  90 tablet  3  . metoprolol (LOPRESSOR) 50 MG tablet Take 1 tablet (50 mg total) by mouth 2 (two) times daily.  180 tablet  3  . NON FORMULARY Oxygen 2L use as bedtime       . olmesartan (BENICAR) 40 MG tablet Take 1 tablet (40 mg total) by mouth daily.  90 tablet  3  . simvastatin (ZOCOR) 40 MG tablet Take 1 tablet (40 mg total) by mouth at bedtime.  90 tablet  3   Review of Systems  Constitutional: Negative for diaphoresis and unexpected weight  change.  HENT: Negative for tinnitus.   Eyes: Negative for visual disturbance.  Respiratory: Negative for choking and stridor.   Gastrointestinal: Negative for vomiting and blood in stool.  Genitourinary: Negative for hematuria and decreased urine volume.  Musculoskeletal: Negative for gait problem.  Skin: Negative for color change and wound.  Neurological: Negative for tremors and numbness.  Psychiatric/Behavioral: Negative for decreased concentration. The patient is not hyperactive.       Objective:   Physical Exam BP 130/90  Pulse 76  Temp 99.1 F (37.3 C) (Oral)  Wt 320 lb 8 oz (145.378 kg)  SpO2 92%  LMP 03/04/2012 Physical Exam  VS noted Constitutional: Pt appears  well-developed and well-nourished. /morbid obese HENT: Head: Normocephalic.  Right Ear: External ear normal.  Left Ear: External ear normal.  Eyes: Conjunctivae and EOM are normal. Pupils are equal, round, and reactive to light.  Neck: Normal range of motion. Neck supple.  Cardiovascular: Normal rate and regular rhythm.   Pulmonary/Chest: Effort normal and breath sounds normal.  Abd:  Soft, NT, non-distended, + BS Neurological: Pt is alert. Not confused , motor/dtr/gait intact Skin: Skin is warm. No erythema. No rash, No LE edema Psychiatric: Pt behavior is normal. Thought content normal. 1+ nervous    Assessment & Plan:

## 2012-03-29 NOTE — Assessment & Plan Note (Addendum)
Ongoing for 3 wks, with some features suggestive of daily migraine,  For imitrex trial, MRI and neurology referral  Note:  Total time for pt hx, exam, review of record with pt in the room, determination of diagnoses and plan for further eval and tx is > 40 min, with over 50% spent in coordination and counseling of patient

## 2012-03-31 ENCOUNTER — Telehealth: Payer: Self-pay | Admitting: *Deleted

## 2012-03-31 NOTE — Telephone Encounter (Signed)
I'm not sure what this is, unless from the recent stress, since the medications like labetolol are usually helpful with tremors, but takes more time to help;    OK to cont same medication for now

## 2012-03-31 NOTE — Telephone Encounter (Signed)
Pt states that since she started new BP medication her fatigue has gotten better but that she is now having trembling in her hands and "involuntary movements from nerves" in her hand. She is asking MD's advisement on whether this is a normal side effect.

## 2012-04-01 NOTE — Telephone Encounter (Signed)
Called the patient informed of MD instructions. 

## 2012-04-01 NOTE — Telephone Encounter (Signed)
Called the patient left message to call back 

## 2012-04-06 ENCOUNTER — Other Ambulatory Visit: Payer: 59

## 2012-04-26 ENCOUNTER — Other Ambulatory Visit: Payer: Self-pay | Admitting: Internal Medicine

## 2012-05-03 ENCOUNTER — Other Ambulatory Visit: Payer: Self-pay | Admitting: Internal Medicine

## 2012-05-30 ENCOUNTER — Other Ambulatory Visit: Payer: Self-pay | Admitting: Internal Medicine

## 2012-07-04 ENCOUNTER — Other Ambulatory Visit: Payer: Self-pay | Admitting: Internal Medicine

## 2012-07-15 ENCOUNTER — Ambulatory Visit: Payer: 59 | Admitting: Internal Medicine

## 2012-07-19 ENCOUNTER — Encounter: Payer: Self-pay | Admitting: Internal Medicine

## 2012-07-19 ENCOUNTER — Other Ambulatory Visit: Payer: Self-pay | Admitting: Internal Medicine

## 2012-07-19 ENCOUNTER — Ambulatory Visit (INDEPENDENT_AMBULATORY_CARE_PROVIDER_SITE_OTHER): Payer: 59 | Admitting: Internal Medicine

## 2012-07-19 ENCOUNTER — Other Ambulatory Visit (INDEPENDENT_AMBULATORY_CARE_PROVIDER_SITE_OTHER): Payer: 59

## 2012-07-19 VITALS — BP 140/90 | HR 78 | Temp 98.7°F | Ht 62.0 in | Wt 345.4 lb

## 2012-07-19 DIAGNOSIS — R7302 Impaired glucose tolerance (oral): Secondary | ICD-10-CM

## 2012-07-19 DIAGNOSIS — I872 Venous insufficiency (chronic) (peripheral): Secondary | ICD-10-CM | POA: Insufficient documentation

## 2012-07-19 DIAGNOSIS — R0902 Hypoxemia: Secondary | ICD-10-CM

## 2012-07-19 DIAGNOSIS — I10 Essential (primary) hypertension: Secondary | ICD-10-CM

## 2012-07-19 DIAGNOSIS — R7309 Other abnormal glucose: Secondary | ICD-10-CM

## 2012-07-19 HISTORY — DX: Venous insufficiency (chronic) (peripheral): I87.2

## 2012-07-19 LAB — CBC WITH DIFFERENTIAL/PLATELET
Eosinophils Absolute: 0.2 10*3/uL (ref 0.0–0.7)
Eosinophils Relative: 2.3 % (ref 0.0–5.0)
Lymphocytes Relative: 20.7 % (ref 12.0–46.0)
MCHC: 33.3 g/dL (ref 30.0–36.0)
MCV: 93.4 fl (ref 78.0–100.0)
Monocytes Absolute: 0.8 10*3/uL (ref 0.1–1.0)
Neutrophils Relative %: 65.5 % (ref 43.0–77.0)
Platelets: 192 10*3/uL (ref 150.0–400.0)
WBC: 7.7 10*3/uL (ref 4.5–10.5)

## 2012-07-19 LAB — BASIC METABOLIC PANEL
BUN: 18 mg/dL (ref 6–23)
CO2: 34 mEq/L — ABNORMAL HIGH (ref 19–32)
Chloride: 95 mEq/L — ABNORMAL LOW (ref 96–112)
Potassium: 3.9 mEq/L (ref 3.5–5.1)

## 2012-07-19 LAB — HEPATIC FUNCTION PANEL
ALT: 23 U/L (ref 0–35)
AST: 22 U/L (ref 0–37)
Total Bilirubin: 0.8 mg/dL (ref 0.3–1.2)
Total Protein: 7.6 g/dL (ref 6.0–8.3)

## 2012-07-19 MED ORDER — IRBESARTAN 300 MG PO TABS: 300.0000 mg | ORAL_TABLET | Freq: Every day | ORAL | Status: DC

## 2012-07-19 NOTE — Assessment & Plan Note (Signed)
Nocturnal only, on 2 L home o2., Pt to work on further wt loss, and wants to f/u with pulm in 6 mo

## 2012-07-19 NOTE — Assessment & Plan Note (Signed)
stable overall by history and exam, recent data reviewed with pt, and pt to continue medical treatment as before,  to f/u any worsening symptoms or concerns, except ok to change benicar to generic avapro 300 mg,  to f/u any worsening symptoms or concerns

## 2012-07-19 NOTE — Assessment & Plan Note (Signed)
Worse recently RLE below knee, for compression stockings

## 2012-07-19 NOTE — Patient Instructions (Addendum)
OK to finish the benicar that you have Then stop the benicar, and change to Avapro (generic) 300 mg per day Please go to the LAB in the Basement (turn left off the elevator) for the tests to be done today You will be contacted by phone if any changes need to be made immediately.  Otherwise, you will receive a letter about your results with an explanation, but please check with MyChart first. Thank you for enrolling in MyChart. Please follow the instructions below to securely access your online medical record. MyChart allows you to send messages to your doctor, view your test results, renew your prescriptions, schedule appointments, and more. To Log into My Chart online, please go by [employer] or [employer] to [email or web address], or download the MyChart App from the [employer] of [employer] .  Your Username is: [email or web address]  (pass [phone]) Please send a practice Message on Mychart later today. Please keep your appointments with your specialists as you have planned - Dr Shelle Iron in 6 months Please return in 6 months, or sooner if needed Your next physical would be due in September 2014

## 2012-07-19 NOTE — Progress Notes (Signed)
Subjective:    Patient ID: Autumn Massey, female    DOB: 1966-02-02, 47 y.o.   MRN: [id number]  HPI  Here after an unfortunate stumble and fall jan 2, striking the medial right mid leg, seen shortly thereafter with pain and swelling and tx with naproxen and cipro per UC for pt reported superficaial phlebitis;  No other testing done or felt needed, pain now completely resolved, but pt recalls some concern about risk of DVT and cont's to have erythema nondiscrete at site of trauma that is new, as well as remarkable new more tense leg edema below the knee that is worse than the chronic mild edema on the left;  Fortunately again no pain and swelling in both legs decreased by morning when lying flat ot near none per pt; then worse again the next day at the end of the day sitting in a chair at work for 8 hrs.  Pt denies chest pain, increased sob or doe, wheezing, orthopnea, PND, increased LE swelling, palpitations, dizziness or syncope, but does have the ongoing chronic sob/doe, and using the home o2 2L diligently every night since episode of PNA last November, had been only sporadic prior, has not seen Dr Shelle Iron in some time, would like to f/u with him after a marked effort at wt loss with better diet and reduced calories, and being more active over the next 6 mo.  Has gained from 324 to current 345 and is quite upset with herself today.  Also mention s/o IUD placement due to irreg menses oct 2013, wonders if somehow this caused her wt gain. Note: benicar now too expensive with her current drug plan.  Pt denies polydipsia, polyuria. Past Medical History  Diagnosis Date  . ANXIETY DEPRESSION 01/29/2009  . DEPRESSION 11/19/2007  . FREQUENCY, URINARY 10/17/2009  . HYPERLIPIDEMIA 11/19/2007  . HYPERTENSION 11/19/2007  . HYPOTHYROIDISM 11/19/2007  . PNEUMONIA 08/06/2010  . POLYCYTHEMIA 01/29/2009  . Preventative health care 04/06/2011  . SLEEP RELATED HYPOVENTILATION/HYPOXEMIA CCE 02/08/2009  . Morbid obesity   . Venous  insufficiency 07/19/2012   Past Surgical History  Procedure Date  . Tonsillectomy     reports that she quit smoking about 9 months ago. She does not have any smokeless tobacco history on file. She reports that she drinks alcohol. She reports that she does not use illicit drugs. family history includes Alcohol abuse in her other; Arthritis in her other; Cancer in her maternal grandfather and mother; and Hypertension in her other. Allergies  Allergen Reactions  . Hydrocodone-Acetaminophen Other (See Comments)    Feels drunk   Current Outpatient Prescriptions on File Prior to Visit  Medication Sig Dispense Refill  . aspirin EC 81 MG tablet Take 81 mg by mouth daily.      . clonazePAM (KLONOPIN) 0.5 MG disintegrating tablet Take 1 tablet (0.5 mg total) by mouth 2 (two) times daily as needed for anxiety.  60 tablet  2  . FLUoxetine (PROZAC) 20 MG capsule TAKE TWO CAPSULES BY MOUTH EVERY DAY  60 capsule  5  . furosemide (LASIX) 40 MG tablet Take 1 tablet (40 mg total) by mouth daily.  90 tablet  3  . hydrochlorothiazide (HYDRODIURIL) 25 MG tablet Take 1 tablet (25 mg total) by mouth daily.  90 tablet  3  . labetalol (NORMODYNE) 200 MG tablet Take 1 tablet (200 mg total) by mouth 2 (two) times daily.  60 tablet  11  . levothyroxine (SYNTHROID, LEVOTHROID) 175 MCG tablet TAKE ONE TABLET BY MOUTH  EVERY DAY  90 tablet  2  . NON FORMULARY Oxygen 2L use as bedtime       . simvastatin (ZOCOR) 40 MG tablet TAKE ONE TABLET BY MOUTH AT BEDTIME  90 tablet  2  . SUMAtriptan (IMITREX) 100 MG tablet Take 1 tablet (100 mg total) by mouth every 2 (two) hours as needed for migraine.  10 tablet  11  . irbesartan (AVAPRO) 300 MG tablet Take 1 tablet (300 mg total) by mouth at bedtime.  90 tablet  3   Review of Systems  Constitutional: Negative for unexpected weight change, or unusual diaphoresis  HENT: Negative for tinnitus.   Eyes: Negative for photophobia and visual disturbance.  Respiratory: Negative for  choking and stridor.   Gastrointestinal: Negative for vomiting and blood in stool.  Genitourinary: Negative for hematuria and decreased urine volume.  Musculoskeletal: Negative for acute joint swelling Skin: Negative for color change and wound.  Neurological: Negative for tremors and numbness other than noted  Psychiatric/Behavioral: Negative for decreased concentration or  hyperactivity.       Objective:   Physical Exam BP 140/90  Pulse 78  Temp 98.7 F (37.1 C) (Oral)  Ht 5\' 2"  (1.575 m)  Wt 345 lb 6 oz (156.661 kg)  BMI 63.17 kg/m2  SpO2 94% VS noted,  Constitutional: Pt appears well-developed and well-nourished. /morbid obese HENT: Head: NCAT.  Right Ear: External ear normal.  Left Ear: External ear normal.  Eyes: Conjunctivae and EOM are normal. Pupils are equal, round, and reactive to light.  Neck: Normal range of motion. Neck supple.  Cardiovascular: Normal rate and regular rhythm.   Pulmonary/Chest: Effort normal and breath sounds normal.  Abd:  Soft, NT, non-distended, + BS Neurological: Pt is alert. Not confused., motor/dtr intact  Skin: Skin is warm. No erythema. except for prob venous stasis dermatitis type erythema/brown changes to anterilor and now medial mid leg, RLE with 2-3+ edema below knee, LLE below knee with 1+ edema - limbs o/w neurovasc intact Psychiatric: Pt behavior is normal. Thought content normal. 1+ nervous    Assessment & Plan:

## 2012-07-19 NOTE — Telephone Encounter (Signed)
Per pt email  Dr. Jonny Ruiz    I noticed that my pap test was not listed. I had the pap done by Dr. Juliene Pina at [facility] on September 19th 2013. She did a Uterine Biopsy and internal ultra sound on October 2nd 2013. She then did the IUD on October 24th. I had a follow up Ultra Sound on Nov 22nd 2013.    She has requested I schedule my next follow up in March or April 2014 which I will do sometime in February.

## 2012-07-19 NOTE — Assessment & Plan Note (Signed)
stable overall by history and exam, recent data reviewed with pt, and pt to continue medical treatment as before,  to f/u any worsening symptoms or concerns Lab Results  Component Value Date   HGBA1C 6.2 07/19/2012    

## 2012-08-04 ENCOUNTER — Institutional Professional Consult (permissible substitution): Payer: 59 | Admitting: Pulmonary Disease

## 2012-08-24 ENCOUNTER — Other Ambulatory Visit: Payer: Self-pay | Admitting: Internal Medicine

## 2012-08-24 NOTE — Telephone Encounter (Signed)
Done hardcopy to robin  

## 2012-08-25 NOTE — Telephone Encounter (Signed)
Faxed hardcopy to pharmacy. 

## 2012-10-11 ENCOUNTER — Telehealth: Payer: Self-pay | Admitting: Endocrinology

## 2012-10-11 NOTE — Telephone Encounter (Signed)
recv'd mail from [employer] - per [facility], based on the refill rate, the patient may not be compliant w/ taking her prescribed statin. Dr. Everardo All would like to see the patient for an appt. LM for pt to call and sch appt / Sherri S.

## 2012-12-06 ENCOUNTER — Institutional Professional Consult (permissible substitution): Payer: 59 | Admitting: Pulmonary Disease

## 2013-01-13 ENCOUNTER — Other Ambulatory Visit: Payer: Self-pay | Admitting: Internal Medicine

## 2013-02-07 ENCOUNTER — Other Ambulatory Visit: Payer: Self-pay

## 2013-02-07 DIAGNOSIS — Z1231 Encounter for screening mammogram for malignant neoplasm of breast: Secondary | ICD-10-CM

## 2013-02-08 ENCOUNTER — Ambulatory Visit: Payer: 59 | Admitting: Internal Medicine

## 2013-02-08 ENCOUNTER — Other Ambulatory Visit: Payer: Self-pay | Admitting: Internal Medicine

## 2013-02-09 ENCOUNTER — Encounter: Payer: Self-pay | Admitting: Internal Medicine

## 2013-02-09 ENCOUNTER — Ambulatory Visit (INDEPENDENT_AMBULATORY_CARE_PROVIDER_SITE_OTHER): Payer: 59 | Admitting: Internal Medicine

## 2013-02-09 VITALS — BP 104/70 | HR 85 | Temp 98.3°F | Ht 62.0 in | Wt 342.0 lb

## 2013-02-09 DIAGNOSIS — M79602 Pain in left arm: Secondary | ICD-10-CM | POA: Insufficient documentation

## 2013-02-09 DIAGNOSIS — I10 Essential (primary) hypertension: Secondary | ICD-10-CM

## 2013-02-09 DIAGNOSIS — M79642 Pain in left hand: Secondary | ICD-10-CM

## 2013-02-09 DIAGNOSIS — M79609 Pain in unspecified limb: Secondary | ICD-10-CM

## 2013-02-09 DIAGNOSIS — D751 Secondary polycythemia: Secondary | ICD-10-CM

## 2013-02-09 MED ORDER — NAPROXEN 500 MG PO TABS: 500.0000 mg | ORAL_TABLET | Freq: Two times a day (BID) | ORAL | Status: DC

## 2013-02-09 MED ORDER — PHENTERMINE HCL 37.5 MG PO CAPS: 37.5000 mg | ORAL_CAPSULE | ORAL | Status: DC

## 2013-02-09 NOTE — Assessment & Plan Note (Signed)
C/w prob left ulnar neuritis, cant r/o cts, for left elbow pad, nsaid prn, UE NCS/EMG, and refer hand surgury

## 2013-02-09 NOTE — Assessment & Plan Note (Signed)
Ok for phentermine asd, limit 3 mo tx

## 2013-02-09 NOTE — Assessment & Plan Note (Signed)
stable overall by history and exam, recent data reviewed with pt, and pt to continue medical treatment as before,  to f/u any worsening symptoms or concerns BP Readings from Last 3 Encounters:  02/09/13 104/70  07/19/12 140/90  03/29/12 130/90

## 2013-02-09 NOTE — Progress Notes (Signed)
Subjective:    Patient ID: Autumn Massey, female    DOB: June 10, 1966, 47 y.o.   MRN: [id number]  HPI  Here to f/u, with tingling to left ulnar distribution, types all day, rests the left elbow on desk quite a bit, gets painful by end of the day,  No weakness but affects her typing and has had to go home from work several times early, worried about losing her job.  Feels shock sensation to elbow flexion.  No truama, sweling.    2L o2 [place] at home nightly works well, using diligently with Hgb improved last visit. Currently on Leave of abscense from work from aug 4 to end aug 18.  Wt back up again after being down to 317, hard to lose, friend losing wt on phentermine Past Medical History  Diagnosis Date  . ANXIETY DEPRESSION 01/29/2009  . DEPRESSION 11/19/2007  . FREQUENCY, URINARY 10/17/2009  . HYPERLIPIDEMIA 11/19/2007  . HYPERTENSION 11/19/2007  . HYPOTHYROIDISM 11/19/2007  . PNEUMONIA 08/06/2010  . POLYCYTHEMIA 01/29/2009  . Preventative health care 04/06/2011  . SLEEP RELATED HYPOVENTILATION/HYPOXEMIA CCE 02/08/2009  . Morbid obesity   . Venous insufficiency 07/19/2012   Past Surgical History  Procedure Laterality Date  . Tonsillectomy      reports that she quit smoking about 16 months ago. She does not have any smokeless tobacco history on file. She reports that  drinks alcohol. She reports that she does not use illicit drugs. family history includes Alcohol abuse in her other; Arthritis in her other; Cancer in her maternal grandfather and mother; Hypertension in her other. Allergies  Allergen Reactions  . Hydrocodone-Acetaminophen Other (See Comments)    Feels drunk   Current Outpatient Prescriptions on File Prior to Visit  Medication Sig Dispense Refill  . aspirin EC 81 MG tablet Take 81 mg by mouth daily.      . clonazePAM (KLONOPIN) 0.5 MG disintegrating tablet DISSOLVE ONE TABLET IN MOUTH TWICE DAILY AS NEEDED FOR ANXIETY  60 tablet  2  . FLUoxetine (PROZAC) 20 MG capsule TAKE TWO CAPSULES  BY MOUTH EVERY DAY  60 capsule  6  . furosemide (LASIX) 40 MG tablet Take 1 tablet (40 mg total) by mouth daily.  90 tablet  3  . hydrochlorothiazide (HYDRODIURIL) 25 MG tablet Take 1 tablet (25 mg total) by mouth daily.  90 tablet  3  . irbesartan (AVAPRO) 300 MG tablet Take 1 tablet (300 mg total) by mouth at bedtime.  90 tablet  3  . labetalol (NORMODYNE) 200 MG tablet Take 1 tablet (200 mg total) by mouth 2 (two) times daily.  60 tablet  11  . levothyroxine (SYNTHROID, LEVOTHROID) 175 MCG tablet TAKE ONE TABLET BY MOUTH EVERY DAY  90 tablet  1  . NON FORMULARY Oxygen 2L use as bedtime       . simvastatin (ZOCOR) 40 MG tablet TAKE ONE TABLET BY MOUTH AT BEDTIME  90 tablet  2   No current facility-administered medications on file prior to visit.    Review of Systems  Constitutional: Negative for unexpected weight change, or unusual diaphoresis  HENT: Negative for tinnitus.   Eyes: Negative for photophobia and visual disturbance.  Respiratory: Negative for choking and stridor.   Gastrointestinal: Negative for vomiting and blood in stool.  Genitourinary: Negative for hematuria and decreased urine volume.  Musculoskeletal: Negative for acute joint swelling Skin: Negative for color change and wound.  Neurological: Negative for tremors and numbness other than noted  Psychiatric/Behavioral: Negative for decreased  concentration or  hyperactivity.       Objective:   Physical Exam BP 104/70  Pulse 85  Temp(Src) 98.3 F (36.8 C) (Oral)  Ht 5\' 2"  (1.575 m)  Wt 342 lb (155.13 kg)  BMI 62.54 kg/m2  SpO2 92% VS noted,  Constitutional: Pt appears well-developed and well-nourished.  HENT: Head: NCAT.  Right Ear: External ear normal.  Left Ear: External ear normal.  Eyes: Conjunctivae and EOM are normal. Pupils are equal, round, and reactive to light.  Neck: Normal range of motion. Neck supple.  Cardiovascular: Normal rate and regular rhythm.   Pulmonary/Chest: Effort normal and breath  sounds normal.  Abd:  Soft, NT, non-distended, + BS Neurological: Pt is alert. Not confused . Motor/sens/dtr intact Skin: Skin is warm. No erythema.  Psychiatric: Pt behavior is normal. Thought content normal.     Assessment & Plan:

## 2013-02-09 NOTE — Patient Instructions (Signed)
Please take all new medication as prescribed - the anti-inflammatory, and the phentermine Please continue all other medications as before, and refills have been done if requested. Please have the pharmacy call with any other refills you may need. You will be contacted regarding the referral for: nerve test for the arms, as well as referral to Hand Surgury Please also wear a elbow pad to left elbow at all times, and avoid any position with elbow bending or pressure on the elbow  Please remember to sign up for My Chart if you have not done so, as this will be important to you in the future with finding out test results, communicating by private email, and scheduling acute appointments online when needed.

## 2013-02-09 NOTE — Assessment & Plan Note (Signed)
Lab Results  Component Value Date   WBC 7.7 07/19/2012   HGB 13.7 07/19/2012   HCT 41.2 07/19/2012   MCV 93.4 07/19/2012   PLT 192.0 07/19/2012   Improved with compliacne with nighttime home o2

## 2013-02-17 ENCOUNTER — Other Ambulatory Visit: Payer: Self-pay | Admitting: Orthopedic Surgery

## 2013-02-18 ENCOUNTER — Encounter (HOSPITAL_BASED_OUTPATIENT_CLINIC_OR_DEPARTMENT_OTHER): Payer: Self-pay | Admitting: *Deleted

## 2013-02-18 NOTE — Progress Notes (Signed)
Chart reviewed by dr singer, ok for dsc

## 2013-02-21 ENCOUNTER — Encounter (HOSPITAL_BASED_OUTPATIENT_CLINIC_OR_DEPARTMENT_OTHER)
Admission: RE | Admit: 2013-02-21 | Discharge: 2013-02-21 | Disposition: A | Discharge status: Home / Self Care | Payer: 59 | Source: Ambulatory Visit | Attending: Orthopedic Surgery | Admitting: Orthopedic Surgery

## 2013-02-21 LAB — BASIC METABOLIC PANEL
Chloride: 96 mEq/L (ref 96–112)
GFR calc Af Amer: >90 mL/min (ref >90)
GFR calc non Af Amer: >90 mL/min (ref >90)
Glucose, Bld: 147 mg/dL — ABNORMAL HIGH (ref 70–99)
Potassium: 4.2 mEq/L (ref 3.5–5.1)
Sodium: 138 mEq/L (ref 135–145)

## 2013-02-22 ENCOUNTER — Ambulatory Visit (HOSPITAL_BASED_OUTPATIENT_CLINIC_OR_DEPARTMENT_OTHER): Payer: 59 | Admitting: Anesthesiology

## 2013-02-22 ENCOUNTER — Ambulatory Visit (HOSPITAL_BASED_OUTPATIENT_CLINIC_OR_DEPARTMENT_OTHER)
Admission: RE | Admit: 2013-02-22 | Discharge: 2013-02-22 | Disposition: A | Discharge status: Home / Self Care | Payer: 59 | Source: Ambulatory Visit | Attending: Orthopedic Surgery | Admitting: Orthopedic Surgery

## 2013-02-22 ENCOUNTER — Encounter (HOSPITAL_BASED_OUTPATIENT_CLINIC_OR_DEPARTMENT_OTHER): Payer: Self-pay | Admitting: *Deleted

## 2013-02-22 ENCOUNTER — Encounter (HOSPITAL_BASED_OUTPATIENT_CLINIC_OR_DEPARTMENT_OTHER): Payer: Self-pay | Admitting: Anesthesiology

## 2013-02-22 ENCOUNTER — Encounter (HOSPITAL_BASED_OUTPATIENT_CLINIC_OR_DEPARTMENT_OTHER)
Admission: RE | Disposition: A | Discharge status: Home / Self Care | Payer: Self-pay | Source: Ambulatory Visit | Attending: Orthopedic Surgery

## 2013-02-22 DIAGNOSIS — G5620 Lesion of ulnar nerve, unspecified upper limb: Secondary | ICD-10-CM | POA: Insufficient documentation

## 2013-02-22 HISTORY — PX: ULNAR TUNNEL RELEASE: SHX820

## 2013-02-22 HISTORY — DX: Sleep apnea, unspecified: G47.30

## 2013-02-22 SURGERY — RELEASE, CUBITAL TUNNEL
Anesthesia: Monitor Anesthesia Care | Site: Elbow | Laterality: Left | Wound class: Clean

## 2013-02-22 MED ORDER — ONDANSETRON HCL 4 MG/2ML IJ SOLN
INTRAMUSCULAR | Status: DC | PRN
  Administered 2013-02-22: 4 mg via INTRAVENOUS

## 2013-02-22 MED ORDER — LACTATED RINGERS IV SOLN
INTRAVENOUS | Status: DC
  Administered 2013-02-22: 12:00:00 via INTRAVENOUS

## 2013-02-22 MED ORDER — MIDAZOLAM HCL 5 MG/5ML IJ SOLN
INTRAMUSCULAR | Status: DC | PRN
  Administered 2013-02-22 (×2): 1 mg via INTRAVENOUS

## 2013-02-22 MED ORDER — BUPIVACAINE HCL (PF) 0.5 % IJ SOLN
INTRAMUSCULAR | Status: DC | PRN
  Administered 2013-02-22: 15 mL

## 2013-02-22 MED ORDER — FENTANYL CITRATE 0.05 MG/ML IJ SOLN
50.0000 ug | INTRAMUSCULAR | Status: DC | PRN
  Administered 2013-02-22: 50 ug via INTRAVENOUS

## 2013-02-22 MED ORDER — FENTANYL CITRATE 0.05 MG/ML IJ SOLN
INTRAMUSCULAR | Status: DC | PRN
  Administered 2013-02-22 (×2): 50 ug via INTRAVENOUS

## 2013-02-22 MED ORDER — OXYCODONE HCL 5 MG/5ML PO SOLN: 5.0000 mg | Freq: Once | ORAL | Status: DC | PRN

## 2013-02-22 MED ORDER — BUPIVACAINE HCL (PF) 0.25 % IJ SOLN
INTRAMUSCULAR | Status: DC | PRN
  Administered 2013-02-22: 10 mL

## 2013-02-22 MED ORDER — HYDROMORPHONE HCL PF 1 MG/ML IJ SOLN
0.2500 mg | INTRAMUSCULAR | Status: DC | PRN
Start: 2013-02-22 — End: 2013-02-22

## 2013-02-22 MED ORDER — CEFAZOLIN SODIUM-DEXTROSE 2-3 GM-% IV SOLR
2.0000 g | INTRAVENOUS | Status: AC
  Administered 2013-02-22: 3 g via INTRAVENOUS

## 2013-02-22 MED ORDER — CHLORHEXIDINE GLUCONATE 4 % EX LIQD: 60.0000 mL | Freq: Once | CUTANEOUS | Status: DC

## 2013-02-22 MED ORDER — BUPIVACAINE-EPINEPHRINE PF 0.5-1:200000 % IJ SOLN
INTRAMUSCULAR | Status: DC | PRN
  Administered 2013-02-22: 30 mL

## 2013-02-22 MED ORDER — MIDAZOLAM HCL 2 MG/2ML IJ SOLN
1.0000 mg | INTRAMUSCULAR | Status: DC | PRN
  Administered 2013-02-22: 1 mg via INTRAVENOUS

## 2013-02-22 MED ORDER — OXYCODONE-ACETAMINOPHEN 5-325 MG PO TABS: ORAL_TABLET | ORAL | Status: DC

## 2013-02-22 MED ORDER — OXYCODONE HCL 5 MG PO TABS: 5.0000 mg | ORAL_TABLET | Freq: Once | ORAL | Status: DC | PRN

## 2013-02-22 SURGICAL SUPPLY — 53 items
BANDAGE ELASTIC 3 VELCRO ST LF (GAUZE/BANDAGES/DRESSINGS) ×4 IMPLANT
BANDAGE ELASTIC 4 VELCRO ST LF (GAUZE/BANDAGES/DRESSINGS) ×2 IMPLANT
BANDAGE GAUZE ELAST BULKY 4 IN (GAUZE/BANDAGES/DRESSINGS) ×2 IMPLANT
BLADE MINI RND TIP GREEN BEAV (BLADE) ×1 IMPLANT
BLADE SURG 15 STRL LF DISP TIS (BLADE) ×2 IMPLANT
BLADE SURG 15 STRL SS (BLADE) ×4
BNDG CMPR 9X4 STRL LF SNTH (GAUZE/BANDAGES/DRESSINGS) ×1
BNDG ESMARK 4X9 LF (GAUZE/BANDAGES/DRESSINGS) ×2 IMPLANT
CHLORAPREP W/TINT 26ML (MISCELLANEOUS) ×2 IMPLANT
CLOTH BEACON ORANGE TIMEOUT ST (SAFETY) ×2 IMPLANT
CORDS BIPOLAR (ELECTRODE) ×2 IMPLANT
COVER MAYO STAND STRL (DRAPES) ×2 IMPLANT
COVER TABLE BACK 60X90 (DRAPES) ×2 IMPLANT
CUFF TOURNIQUET SINGLE 18IN (TOURNIQUET CUFF) IMPLANT
DECANTER SPIKE VIAL GLASS SM (MISCELLANEOUS) IMPLANT
DRAPE EXTREMITY T 121X128X90 (DRAPE) ×2 IMPLANT
DRAPE SURG 17X23 STRL (DRAPES) ×2 IMPLANT
DRSG PAD ABDOMINAL 8X10 ST (GAUZE/BANDAGES/DRESSINGS) ×2 IMPLANT
GAUZE SPONGE 4X4 16PLY XRAY LF (GAUZE/BANDAGES/DRESSINGS) IMPLANT
GAUZE XEROFORM 1X8 LF (GAUZE/BANDAGES/DRESSINGS) ×2 IMPLANT
GLOVE BIO SURGEON STRL SZ 6.5 (GLOVE) ×1 IMPLANT
GLOVE BIO SURGEON STRL SZ7.5 (GLOVE) ×4 IMPLANT
GLOVE BIOGEL PI IND STRL 7.0 (GLOVE) IMPLANT
GLOVE BIOGEL PI IND STRL 8 (GLOVE) ×1 IMPLANT
GLOVE BIOGEL PI IND STRL 8.5 (GLOVE) IMPLANT
GLOVE BIOGEL PI INDICATOR 7.0 (GLOVE) ×1
GLOVE BIOGEL PI INDICATOR 8 (GLOVE) ×1
GLOVE BIOGEL PI INDICATOR 8.5 (GLOVE) ×1
GLOVE SURG ORTHO 8.0 STRL STRW (GLOVE) ×3 IMPLANT
GOWN BRE IMP PREV XXLGXLNG (GOWN DISPOSABLE) ×4 IMPLANT
GOWN PREVENTION PLUS XLARGE (GOWN DISPOSABLE) ×2 IMPLANT
NDL HYPO 25X1 1.5 SAFETY (NEEDLE) IMPLANT
NEEDLE HYPO 25X1 1.5 SAFETY (NEEDLE) ×2 IMPLANT
NS IRRIG 1000ML POUR BTL (IV SOLUTION) ×2 IMPLANT
PACK BASIN DAY SURGERY FS (CUSTOM PROCEDURE TRAY) ×2 IMPLANT
PAD CAST 3X4 CTTN HI CHSV (CAST SUPPLIES) ×1 IMPLANT
PAD CAST 4YDX4 CTTN HI CHSV (CAST SUPPLIES) ×1 IMPLANT
PADDING CAST ABS 4INX4YD NS (CAST SUPPLIES) ×1
PADDING CAST ABS COTTON 4X4 ST (CAST SUPPLIES) ×1 IMPLANT
PADDING CAST COTTON 3X4 STRL (CAST SUPPLIES) ×2
PADDING CAST COTTON 4X4 STRL (CAST SUPPLIES) ×2
SPLINT PLASTER CAST XFAST 3X15 (CAST SUPPLIES) ×30 IMPLANT
SPLINT PLASTER XTRA FASTSET 3X (CAST SUPPLIES) ×30
SPONGE GAUZE 4X4 12PLY (GAUZE/BANDAGES/DRESSINGS) ×2 IMPLANT
STOCKINETTE 4X48 STRL (DRAPES) ×2 IMPLANT
SUT ETHILON 4 0 PS 2 18 (SUTURE) ×2 IMPLANT
SUT VIC AB 2-0 SH 27 (SUTURE) ×2
SUT VIC AB 2-0 SH 27XBRD (SUTURE) ×1 IMPLANT
SUT VICRYL 4-0 PS2 18IN ABS (SUTURE) IMPLANT
SYR BULB 3OZ (MISCELLANEOUS) ×2 IMPLANT
SYR CONTROL 10ML LL (SYRINGE) ×1 IMPLANT
TOWEL OR 17X24 6PK STRL BLUE (TOWEL DISPOSABLE) ×2 IMPLANT
UNDERPAD 30X30 INCONTINENT (UNDERPADS AND DIAPERS) ×2 IMPLANT

## 2013-02-22 NOTE — H&P (Signed)
Autumn Massey is an 47 y.o. female.   Chief Complaint: left cubital tunnel syndrome HPI: 47 yo rhd female with numbness in small and ring finger x 1 month.  This is bothersome to her.  Nocturnal symptoms.  She has tried a heelbo brace with some relief.  She wishes to have a cubital tunnel release for management of symptoms.  Past Medical History  Diagnosis Date  . ANXIETY DEPRESSION 01/29/2009  . DEPRESSION 11/19/2007  . FREQUENCY, URINARY 10/17/2009  . HYPERLIPIDEMIA 11/19/2007  . HYPERTENSION 11/19/2007  . HYPOTHYROIDISM 11/19/2007  . PNEUMONIA 08/06/2010  . POLYCYTHEMIA 01/29/2009  . Preventative health care 04/06/2011  . SLEEP RELATED HYPOVENTILATION/HYPOXEMIA CCE 02/08/2009  . Morbid obesity   . Venous insufficiency 07/19/2012  . Sleep apnea     mild sleep apnea, on o2 at 2l at nighttime    Past Surgical History  Procedure Laterality Date  . Tonsillectomy      Family History  Problem Relation Age of Onset  . Cancer Mother     melanoma   . Cancer Maternal Grandfather     prostate cancer  . Alcohol abuse Other   . Arthritis Other   . Hypertension Other    Social History:  reports that she has been smoking.  She does not have any smokeless tobacco history on file. She reports that  drinks alcohol. She reports that she does not use illicit drugs.  Allergies:  Allergies  Allergen Reactions  . Hydrocodone-Acetaminophen Other (See Comments)    Feels drunk    No prescriptions prior to admission    Results for orders placed during the hospital encounter of 02/22/13 (from the past 48 hour(s))  BASIC METABOLIC PANEL     Status: Abnormal   Collection Time    02/21/13  3:00 PM      Result Value Range   Sodium 138  135 - 145 mEq/L   Potassium 4.2  3.5 - 5.1 mEq/L   Chloride 96  96 - 112 mEq/L   CO2 33 (*) 19 - 32 mEq/L   Glucose, Bld 147 (*) 70 - 99 mg/dL   BUN 10  6 - 23 mg/dL   Creatinine, Ser 1.61  0.50 - 1.10 mg/dL   Calcium 9.6  8.4 - 09.6 mg/dL   GFR calc non Af Amer >90   >90 mL/min   GFR calc Af Amer >90  >90 mL/min   Comment: (NOTE)     The eGFR has been calculated using the CKD EPI equation.     This calculation has not been validated in all clinical situations.     eGFR's persistently <90 mL/min signify possible Chronic Kidney     Disease.    No results found.   A comprehensive review of systems was negative except for: Eyes: positive for contacts/glasses Ears, nose, mouth, throat, and face: positive for hearing loss and tinnitus Respiratory: positive for pneumonia  Height 5\' 2"  (1.575 m), weight 342 lb (155.13 kg).  General appearance: alert, cooperative and appears stated age Head: Normocephalic, without obvious abnormality, atraumatic Neck: supple, symmetrical, trachea midline Resp: clear to auscultation bilaterally Cardio: regular rate and rhythm GI: non tender Extremities: intact sensation and capillary refill all digits.  left small and ring finger feel different to her.  +epl/fpl/io. ttp ulnar nerve at elbow.  positive tinels Pulses: 2+ and symmetric Skin: Skin color, texture, turgor normal. No rashes or lesions Neurologic: Grossly normal Incision/Wound: na  Assessment/Plan Left cubital tunnel syndrome.  Non operative and operative  treatment options were discussed with the patient and patient wishes to proceed with operative treatment. Discussed decompression of nerve and possible need for transposition.  Risks, benefits, and alternatives of surgery were discussed and the patient agrees with the plan of care.   Agastya Meister R 02/22/2013, 9:43 AM

## 2013-02-22 NOTE — Brief Op Note (Signed)
02/22/2013  1:25 PM  PATIENT:  Autumn Massey  47 y.o. female  PRE-OPERATIVE DIAGNOSIS:  LEFT CUBITAL TUNNEL  POST-OPERATIVE DIAGNOSIS:  Left cubital Tunnel  PROCEDURE:  Procedure(s): LEFT ULNAR NERVE DECOMPRESSION   SURGEON:  Surgeon(s): Tami Ribas, MD  PHYSICIAN ASSISTANT:   ASSISTANTS: Cindee Salt, MD   ANESTHESIA:   regional  EBL:  Total I/O In: 200 [I.V.:200] Out: -   DRAINS: none   LOCAL MEDICATIONS USED:  MARCAINE     SPECIMEN:  No Specimen  DISPOSITION OF SPECIMEN:  N/A  COUNTS:  YES  TOURNIQUET:   Total Tourniquet Time Documented: Upper Arm (Left) - 34 minutes Total: Upper Arm (Left) - 34 minutes   DICTATION: .Other Dictation: Dictation Number [id number]  PLAN OF CARE: Discharge to home after PACU

## 2013-02-22 NOTE — Anesthesia Postprocedure Evaluation (Signed)
Anesthesia Post Note  Patient: Autumn Massey  Procedure(s) Performed: Procedure(s) (LRB): LEFT ULNAR NERVE DECOMPRESSION  (Left)  Anesthesia type: MAC  Patient location: PACU  Post pain: Pain level controlled  Post assessment: Patient's Cardiovascular Status Stable  Last Vitals:  Filed Vitals:   02/22/13 1400  BP: 93/50  Pulse: 79  Temp:   Resp: 21    Post vital signs: Reviewed and stable  Level of consciousness: sedated  Complications: No apparent anesthesia complications

## 2013-02-22 NOTE — Anesthesia Procedure Notes (Signed)
Anesthesia Regional Block:  Supraclavicular block  Pre-Anesthetic Checklist: ,, timeout performed, Correct Patient, Correct Site, Correct Laterality, Correct Procedure, Correct Position, site marked, Risks and benefits discussed, pre-op evaluation, post-op pain management  Laterality: Left  Prep: Maximum Sterile Barrier Precautions used and chloraprep       Needles:  Injection technique: Single-shot  Needle Type: Echogenic Stimulator Needle     Needle Length: 5cm 5 cm Needle Gauge: 22 and 22 G    Additional Needles:  Procedures: ultrasound guided (picture in chart) Supraclavicular block Narrative:  Start time: 02/22/2013 11:48 AM End time: 02/22/2013 11:59 AM Injection made incrementally with aspirations every 5 mL. Anesthesiologist: Jase Himmelberger,MD  Additional Notes: 2% Lidocaine skin wheel. Intercostobrachial block with 10cc of 0.5% Bupivicaine plain.  Supraclavicular block

## 2013-02-22 NOTE — Progress Notes (Signed)
02/22/13 1130  OBSTRUCTIVE SLEEP APNEA  Have you ever been diagnosed with sleep apnea through a sleep study? Yes  If yes, do you have and use a CPAP or BPAP machine every night? 0  Do you snore loudly (loud enough to be heard through closed doors)?  1  Do you often feel tired, fatigued, or sleepy during the daytime? 1  Has anyone observed you stop breathing during your sleep? 0  Do you have, or are you being treated for high blood pressure? 1  BMI more than 35 kg/m2? 1  Age over 50 years old? 0  Neck circumference greater than 40 cm/18 inches? 1  Gender: 0  Obstructive Sleep Apnea Score 5  Score 4 or greater  Results sent to PCP (Dr James John)   

## 2013-02-22 NOTE — Op Note (Signed)
[id number] 

## 2013-02-22 NOTE — Transfer of Care (Signed)
Immediate Anesthesia Transfer of Care Note  Patient: Autumn Massey  Procedure(s) Performed: Procedure(s): LEFT ULNAR NERVE DECOMPRESSION  (Left)  Patient Location: PACU  Anesthesia Type:MAC combined with regional for post-op pain  Level of Consciousness: awake, alert  and oriented  Airway & Oxygen Therapy: Patient Spontanous Breathing and Patient connected to face mask oxygen  Post-op Assessment: Report given to PACU RN and Post -op Vital signs reviewed and stable  Post vital signs: Reviewed and stable  Complications: No apparent anesthesia complications

## 2013-02-22 NOTE — Anesthesia Preprocedure Evaluation (Addendum)
Anesthesia Evaluation  Patient identified by MRN, date of birth, ID band Patient awake    Reviewed: Allergy & Precautions, H&P , NPO status , Patient's Chart, lab work & pertinent test results  Airway Mallampati: III TM Distance: >3 FB Neck ROM: Full    Dental no notable dental hx. (+) Teeth Intact, Dental Advisory Given and Partial Upper   Pulmonary sleep apnea ,  breath sounds clear to auscultation  Pulmonary exam normal       Cardiovascular hypertension, On Medications + Peripheral Vascular Disease negative cardio ROS  Rhythm:Regular Rate:Normal     Neuro/Psych PSYCHIATRIC DISORDERS negative neurological ROS     GI/Hepatic negative GI ROS, Neg liver ROS,   Endo/Other  Hypothyroidism Morbid obesity  Renal/GU negative Renal ROS  negative genitourinary   Musculoskeletal   Abdominal   Peds  Hematology negative hematology ROS (+)   Anesthesia Other Findings   Reproductive/Obstetrics negative OB ROS                          Anesthesia Physical Anesthesia Plan  ASA: III  Anesthesia Plan: Regional and MAC   Post-op Pain Management:    Induction: Intravenous  Airway Management Planned:   Additional Equipment:   Intra-op Plan:   Post-operative Plan: Extubation in OR  Informed Consent: I have reviewed the patients History and Physical, chart, labs and discussed the procedure including the risks, benefits and alternatives for the proposed anesthesia with the patient or authorized representative who has indicated his/her understanding and acceptance.   Dental advisory given  Plan Discussed with: CRNA  Anesthesia Plan Comments:        Anesthesia Quick Evaluation

## 2013-02-22 NOTE — Progress Notes (Signed)
Assisted Dr. Fitzgerald with left, ultrasound guided, supraclavicular block. Side rails up, monitors on throughout procedure. See vital signs in flow sheet. Tolerated Procedure well. 

## 2013-02-22 NOTE — Progress Notes (Signed)
02/22/13 1130  OBSTRUCTIVE SLEEP APNEA  Have you ever been diagnosed with sleep apnea through a sleep study? Yes  If yes, do you have and use a CPAP or BPAP machine every night? 0  Do you snore loudly (loud enough to be heard through closed doors)?  1  Do you often feel tired, fatigued, or sleepy during the daytime? 1  Has anyone observed you stop breathing during your sleep? 0  Do you have, or are you being treated for high blood pressure? 1  BMI more than 35 kg/m2? 1  Age over 57 years old? 0  Neck circumference greater than 40 cm/18 inches? 1  Gender: 0  Obstructive Sleep Apnea Score 5  Score 4 or greater  Results sent to PCP (Dr Oliver Barre)

## 2013-02-23 ENCOUNTER — Encounter (HOSPITAL_BASED_OUTPATIENT_CLINIC_OR_DEPARTMENT_OTHER): Payer: Self-pay | Admitting: Orthopedic Surgery

## 2013-02-23 ENCOUNTER — Ambulatory Visit: Payer: 59

## 2013-02-23 NOTE — Op Note (Signed)
NAMEHENDEL, GATLIFF             ACCOUNT NO.:  [id number]  MEDICAL RECORD NO.:  [id number]  LOCATION:                                 FACILITY:  PHYSICIAN:  Betha Loa, MD             DATE OF BIRTH:  DATE OF PROCEDURE:  02/22/2013 DATE OF DISCHARGE:                              OPERATIVE REPORT   PREOPERATIVE DIAGNOSIS:  Left cubital tunnel syndrome.  POSTOPERATIVE DIAGNOSIS:  Left cubital tunnel syndrome.  PROCEDURE:  Left ulnar nerve decompression at the elbow.  SURGEON:  Betha Loa, MD  ASSISTANT:  Cindee Salt, MD.  ANESTHESIA:  Regional with sedation.  IV FLUIDS:  Per anesthesia flow sheet.  ESTIMATED BLOOD LOSS:  Minimal.  COMPLICATIONS:  None.  SPECIMENS:  None.  TOURNIQUET TIME:  34 minutes.  DISPOSITION:  Stable to PACU.  INDICATIONS:  Ms. Kaufhold is a 47 year old female who has been having ulnar nerve compression symptoms in the left upper extremity.  She has numbness in the small finger and ulnar half of the ring finger.  This is very bothersome to her.  She has nocturnal symptoms.  She wished to have this released.  Risks, benefits, alternatives surgery were discussed including risk of blood loss, infection, damage to nerves, vessels, tendons, ligaments, bone; failure of surgery; need for additional surgery, complications with wound healing, continued pain, continued numbness.  She voiced understanding of these risks and elected to proceed.  OPERATIVE COURSE:  After being identified preoperatively by myself, the patient and I agreed upon procedure and site procedure.  Surgical site was marked.  The risks, benefits, and alternatives of surgery were reviewed and she wished to proceed.  Surgical consent had been signed. She was given IV Ancef as preoperative antibiotic prophylaxis.  She was transferred to the operating room, placed on the operative table in supine position with left upper extremity on arm board.  Regional block had been performed by  anesthesia in preoperative holding.  Left upper extremity was prepped and draped in normal sterile orthopedic fashion. Surgical pause was performed between surgeons, anesthesia, and operating staff, and all were in agreement as to the patient, procedure, site procedure.  Tourniquet at the proximal aspect of the extremity was inflated to 250 mmHg after exsanguination of the limb with an Esmarch bandage.  An incision was made at the ulnar side of the elbow.  It was carried into subcutaneous tissues by spreading technique.  Bipolar cautery was used to obtain hemostasis.  The ulnar nerve was identified the Osborne's ligament.  The ligament was divided closer to its posterior side.  The nerve was decompressed distally.  It was then decompressed proximally.  A finger was placed into the wound to ensure complete decompression.  No bands were felt.  The wound was copiously irrigated with sterile saline.  The elbow was placed through a range of motion.  The nerve did not subluxate out of the groove.  A 2-0 Vicryl suture was used in an inverted interrupted fashion, subcutaneous tissues to close dead space and the skin was closed with 4-0 nylon in a horizontal mattress fashion.  The wound was dressed with sterile Xeroform, 4x4s, and wrapped  with Kerlix and Ace bandage.  A posterior splint had been placed.  The surgical site had been augmented with 10 mL of 0.25% plain Marcaine to aid in anesthesia and to help with postoperative analgesia as well.  The fingertips were all pink with brisk capillary refill after deflation of tourniquet.  Operative drapes were broken down.  The patient was awakened from her sedation safely. She was transferred back to stretcher and taken to PACU in stable condition.  I will see her back in the office in 1 week for postoperative followup.  I will give her Percocet 5/325, 1-2 p.o. q.6 hours p.r.n. pain, dispensed #30.     Betha Loa, MD     KK/MEDQ  D:   02/22/2013  T:  02/23/2013  Job:  [id number]

## 2013-03-15 ENCOUNTER — Telehealth: Payer: Self-pay | Admitting: Internal Medicine

## 2013-03-15 NOTE — Telephone Encounter (Signed)
Recd records from [facility], Forwarding 5pgs to Dr.John

## 2013-03-22 ENCOUNTER — Other Ambulatory Visit: Payer: Self-pay

## 2013-03-22 MED ORDER — HYDROCHLOROTHIAZIDE 25 MG PO TABS: 25.0000 mg | ORAL_TABLET | Freq: Every day | ORAL | Status: DC

## 2013-04-01 ENCOUNTER — Encounter: Payer: Self-pay | Admitting: Diagnostic Neuroimaging

## 2013-04-01 ENCOUNTER — Ambulatory Visit (INDEPENDENT_AMBULATORY_CARE_PROVIDER_SITE_OTHER): Payer: 59 | Admitting: Diagnostic Neuroimaging

## 2013-04-01 VITALS — BP 127/72 | HR 86 | Temp 98.1°F | Ht 62.0 in | Wt 332.0 lb

## 2013-04-01 DIAGNOSIS — M62838 Other muscle spasm: Secondary | ICD-10-CM

## 2013-04-01 NOTE — Progress Notes (Signed)
[facility]  PATIENT: Autumn Massey DOB: 09-15-1965  REFERRING CLINICIAN: Karlyn Agee HISTORY FROM: patient REASON FOR VISIT: new consult   HISTORICAL  CHIEF COMPLAINT:  Chief Complaint  Patient presents with  . NP    tremors left small finger, neuritis, paper referral    HISTORY OF PRESENT ILLNESS:   47 year old right-handed female with hypertension, hypertensive, anxiety, here for evaluation of left hand fifth digit spasm, status post cubital tunnel release surgery in 02/22/2013.  Patient had left hand fourth and fifth digit numbness and tingling, diagnosed with ulnar neuropathy at the elbow. She opted for surgical evaluation and treatment, with cubital tunnel release surgery. Postoperatively patient had a splint for one week. After this was removed patient noticed involuntary movements of the left fifth digit. Patient was tried on a course of prednisone which helped a little bit. Patient had some soreness in her left forearm region near the extensor muscles of the forearm.   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   REVIEW OF SYSTEMS: Full 14 system review of systems performed and notable only for anxiety hearing loss ringing in ears.  ALLERGIES: No Known Allergies  HOME MEDICATIONS: Outpatient Prescriptions Prior to Visit  Medication Sig Dispense Refill  . aspirin EC 81 MG tablet Take 81 mg by mouth daily.      . clonazePAM (KLONOPIN) 0.5 MG disintegrating tablet DISSOLVE ONE TABLET IN MOUTH TWICE DAILY AS NEEDED FOR ANXIETY  60 tablet  2  . FLUoxetine (PROZAC) 20 MG capsule TAKE TWO CAPSULES BY MOUTH EVERY DAY  60 capsule  6  . folic acid (FOLVITE) 800 MCG tablet Take 400 mcg by mouth daily.      . furosemide (LASIX) 40 MG tablet Take 1 tablet (40 mg total) by mouth daily.  90 tablet  3  . hydrochlorothiazide (HYDRODIURIL) 25 MG tablet Take 1 tablet (25 mg total) by mouth daily.  90 tablet   3  . irbesartan (AVAPRO) 300 MG tablet Take 1 tablet (300 mg total) by mouth at bedtime.  90 tablet  3  . labetalol (NORMODYNE) 200 MG tablet Take 1 tablet (200 mg total) by mouth 2 (two) times daily.  60 tablet  11  . levothyroxine (SYNTHROID, LEVOTHROID) 175 MCG tablet TAKE ONE TABLET BY MOUTH EVERY DAY  90 tablet  1  . naproxen (NAPROSYN) 500 MG tablet Take 1 tablet (500 mg total) by mouth 2 (two) times daily with a meal.  60 tablet  2  . NON FORMULARY Oxygen 2L use as bedtime      . oxyCODONE-acetaminophen (PERCOCET) 5-325 MG per tablet 1-2 tabs po q6 hours prn pain  30 tablet  0  . phentermine 37.5 MG capsule Take 1 capsule (37.5 mg total) by mouth every morning.  30 capsule  2  . simvastatin (ZOCOR) 40 MG tablet TAKE ONE TABLET BY MOUTH AT BEDTIME  90 tablet  2   No facility-administered medications prior to visit.    PAST MEDICAL HISTORY: Past Medical History  Diagnosis Date  . ANXIETY DEPRESSION 01/29/2009  . DEPRESSION 11/19/2007  . FREQUENCY, URINARY 10/17/2009  . HYPERLIPIDEMIA 11/19/2007  . HYPERTENSION 11/19/2007  . HYPOTHYROIDISM 11/19/2007  . PNEUMONIA 08/06/2010  . POLYCYTHEMIA 01/29/2009  . Preventative health care 04/06/2011  . SLEEP RELATED HYPOVENTILATION/HYPOXEMIA CCE 02/08/2009  . Morbid obesity   . Venous insufficiency 07/19/2012  . Sleep apnea     mild sleep apnea, on o2 at  2l at nighttime    PAST SURGICAL HISTORY: Past Surgical History  Procedure Laterality Date  . Tonsillectomy    . Ulnar tunnel release Left 02/22/2013    Procedure: LEFT ULNAR NERVE DECOMPRESSION ;  Surgeon: Tami Ribas, MD;  Location: [facility];  Service: Orthopedics;  Laterality: Left;    FAMILY HISTORY: Family History  Problem Relation Age of Onset  . Cancer Mother     melanoma   . Cancer Maternal Grandfather     prostate cancer  . Alcohol abuse Other   . Arthritis Other   . Hypertension Other     SOCIAL HISTORY:  History   Social History  . Marital Status:  Divorced    Spouse Name: N/A    Number of Children: 0  . Years of Education: 12   Occupational History  . customer serv. manager [employer]    Social History Main Topics  . Smoking status: Current Every Day Smoker -- 0.25 packs/day  . Smokeless tobacco: Not on file     Comment: started at age 89, less than 1 ppd.  . Alcohol Use: Yes     Comment: rare  . Drug Use: No  . Sexual Activity: Yes    Birth Control/ Protection: IUD   Other Topics Concern  . Not on file   Social History Narrative  . No narrative on file     PHYSICAL EXAM  Filed Vitals:   04/01/13 1132  BP: 127/72  Pulse: 86  Temp: 98.1 F (36.7 C)  TempSrc: Oral  Height: 5\' 2"  (1.575 m)  Weight: 332 lb (150.594 kg)    Not recorded    Body mass index is 60.71 kg/(m^2).  GENERAL EXAM: Patient is in no distress  CARDIOVASCULAR: Regular rate and rhythm, no murmurs, no carotid bruits  NEUROLOGIC: MENTAL STATUS: awake, alert, language fluent, comprehension intact, naming intact CRANIAL NERVE: no papilledema on fundoscopic exam, pupils equal and reactive to light, visual fields full to confrontation, extraocular muscles intact, no nystagmus, facial sensation and strength symmetric, uvula midline, shoulder shrug symmetric, tongue midline. MOTOR: normal bulk and tone, full strength in the BUE, BLE; LEFT 5TH DIGIT HAS ERRATIC, EXTENSOR SPASMS AND MOVEMENTS, WHICH FLUCTUATE. THEY IMPROVE WITH DISTRACTION AND CHANGE IN HAND POSITION. THEY WORSEN WHEN PATIENT PLACES LEFT HAND PRONE ON THE DESK.  SENSORY: normal and symmetric to light touch, pinprick, temperature, vibration COORDINATION: finger-nose-finger, fine finger movements normal REFLEXES: deep tendon reflexes TRACE and symmetric GAIT/STATION: narrow based gait   DIAGNOSTIC DATA (LABS, IMAGING, TESTING) - I reviewed patient records, labs, notes, testing and imaging myself where available.  Lab Results  Component Value Date   WBC 7.7  07/19/2012   HGB 17.3* 02/22/2013   HCT 41.2 07/19/2012   MCV 93.4 07/19/2012   PLT 192.0 07/19/2012      Component Value Date/Time   NA 138 02/21/2013 1500   K 4.2 02/21/2013 1500   CL 96 02/21/2013 1500   CO2 33* 02/21/2013 1500   GLUCOSE 147* 02/21/2013 1500   BUN 10 02/21/2013 1500   CREATININE 0.62 02/21/2013 1500   CALCIUM 9.6 02/21/2013 1500   PROT 7.6 07/19/2012 1615   ALBUMIN 4.2 07/19/2012 1615   AST 22 07/19/2012 1615   ALT 23 07/19/2012 1615   ALKPHOS 47 07/19/2012 1615   BILITOT 0.8 07/19/2012 1615   GFRNONAA >90 02/21/2013 1500   GFRAA >90 02/21/2013 1500   Lab Results  Component Value Date   CHOL 170 03/15/2012  HDL 39.90 03/15/2012   LDLCALC 108* 03/15/2012   LDLDIRECT 107.8 08/16/2010   TRIG 112.0 03/15/2012   CHOLHDL 4 03/15/2012   Lab Results  Component Value Date   HGBA1C 6.2 07/19/2012   No results found for this basename: VITAMINB12   Lab Results  Component Value Date   TSH 4.15 03/15/2012     ASSESSMENT AND PLAN  47 y.o. year old female here with abnormal, involuntary movements of left 5th digit (extensor spasm) which is erratic, fluctating, position dependent and improves with distraction. Neurologic exam is otherwise unremarkable. This is  quite unusual and I have low suspicion that this represent a radial neuritis/neuropathy or other primary neurologic process. Hopefully, symptoms will continue to spontaneously improve.  PLAN: 1. Advised patient to continue occupation/physical therapy 2. Try to change/move hand position to relieve spasms when they occur 3. No further neurologic testing advised  Return for return to Dr. Merlyn Lot.    Suanne Marker, MD 04/01/2013, 12:33 PM Certified in Neurology, Neurophysiology and Neuroimaging  [facility] [address] [place], [place] [address] [phone]

## 2013-04-01 NOTE — Patient Instructions (Signed)
Follow up with Dr. Kuzma.  °

## 2013-05-01 ENCOUNTER — Other Ambulatory Visit: Payer: Self-pay | Admitting: Internal Medicine

## 2013-05-03 ENCOUNTER — Encounter: Payer: Self-pay | Admitting: Family Medicine

## 2013-05-03 ENCOUNTER — Other Ambulatory Visit (INDEPENDENT_AMBULATORY_CARE_PROVIDER_SITE_OTHER): Payer: 59

## 2013-05-03 ENCOUNTER — Ambulatory Visit (INDEPENDENT_AMBULATORY_CARE_PROVIDER_SITE_OTHER): Payer: 59 | Admitting: Family Medicine

## 2013-05-03 VITALS — BP 134/84 | HR 93 | Wt 343.0 lb

## 2013-05-03 DIAGNOSIS — M25562 Pain in left knee: Secondary | ICD-10-CM

## 2013-05-03 DIAGNOSIS — M25569 Pain in unspecified knee: Secondary | ICD-10-CM

## 2013-05-03 DIAGNOSIS — S83412A Sprain of medial collateral ligament of left knee, initial encounter: Secondary | ICD-10-CM

## 2013-05-03 DIAGNOSIS — S83419A Sprain of medial collateral ligament of unspecified knee, initial encounter: Secondary | ICD-10-CM

## 2013-05-03 MED ORDER — TRAMADOL HCL 50 MG PO TABS: 50.0000 mg | ORAL_TABLET | Freq: Every evening | ORAL | Status: DC | PRN

## 2013-05-03 MED ORDER — MELOXICAM 15 MG PO TABS: 15.0000 mg | ORAL_TABLET | Freq: Every day | ORAL | Status: DC

## 2013-05-03 NOTE — Assessment & Plan Note (Signed)
Patient does have appears to be an MCL sprain. There is a questionable tear there as well. Patient may also have a meniscal injury. Patient was fitted with a brace today patient will wear this with activity Discussed icing protocol Meloxicam and tramadol per orders Home exercise program given Patient will follow up in 2-3 weeks. At that time if continued pain we will try a intra-articular injection under ultrasound guidance secondary to patient's body habitus.

## 2013-05-03 NOTE — Progress Notes (Signed)
CC: Left knee pain  HPI: Patient is a very pleasant 47 year old obese female coming in with left knee pain after fall. This occurred approximately 10 days ago. Patient did fall on this he did have pain and swelling within 24 hours. Patient states that the swelling seemed to resolve but she continued to have pain mostly on the medial aspect of the knee. She describes it as more of a dull aching sensation with a sharp stabbing sensation whenever she rotates her leg in specific positions. Patient does put it in certain positions it feel like it gets stuck. Patient denies giving out on her denies any further swelling. Patient has tried some over-the-counter anti-inflammatories and Tylenol with minimal benefit. Patient was the pain at 8/10.  Past medical, surgical, family and social history reviewed. Medications reviewed all in the electronic medical record.   Review of Systems: No headache, visual changes, nausea, vomiting, diarrhea, constipation, dizziness, abdominal pain, skin rash, fevers, chills, night sweats, weight loss, swollen lymph nodes, body aches, joint swelling, muscle aches, chest pain, shortness of breath, mood changes.   Objective:    Blood pressure 134/84, pulse 93, weight 343 lb (155.584 kg), SpO2 93.00%.   General: No apparent distress alert and oriented x3 mood and affect normal, dressed appropriately. Obese.  HEENT: Pupils equal, extraocular movements intact Respiratory: Patient's speak in full sentences and does not appear short of breath Cardiovascular: No lower extremity edema, non tender, no erythema Skin: Warm dry intact with no signs of infection or rash on extremities or on axial skeleton. Abdomen: Soft nontender Neuro: Cranial nerves II through XII are intact, neurovascularly intact in all extremities with 2+ DTRs and 2+ pulses. Lymph: No lymphadenopathy of posterior or anterior cervical chain or axillae bilaterally.  Gait mild antalgic gait  MSK: Non tender with full  range of motion and good stability and symmetric strength and tone of shoulders, elbows, wrist, hip and ankles bilaterally.  Knee: Left Normal to inspection with no erythema or effusion or obvious bony abnormalities. Severe tenderness to palpation over the medial joint line and the MCL. ROM full in flexion and extension and lower leg rotation. Ligaments with solid consistent endpoints including ACL, PCL, LCL, MCL. Patient though does have pain with stressing the MCL. Positive Mcmurray's, Apley's, and Thessalonian tests. Mild painful patellar compression. Patellar glide with mild crepitus. Patellar and quadriceps tendons unremarkable. Hamstring and quadriceps strength is normal.   MSK US performed of: Left knee This study was ordered, performed, and interpreted by Terrilee Files D.O.  Knee: All structures visualized. anterolateral, posteromedial, and posterolateral menisci unremarkable without tearing, fraying, effusion, or displacement. Anterior medial portion of the left knee meniscus is hard to identify secondary to hypoechoic changes in the area. It appears to be some displacement in the area. Patient does have mild osteophytic changes. Patellar Tendon unremarkable on long and transverse views without effusion. No abnormality of prepatellar bursa. LCL  unremarkable on long and transverse views. MCL does show mild defect on the superficial bundle. This does have increased upper flow and likely is a small tear. This has not appear to be full thickness. No abnormality of origin of medial or lateral head of the gastrocnemius.  IMPRESSION:  MCL partial tear with questionable meniscal injury     Impression and Recommendations:     This case required medical decision making of moderate complexity.

## 2013-05-03 NOTE — Patient Instructions (Signed)
Very nice to meet you Stop the naproxen Start meloxicam daily for 10 days then as needed.  Tramadol at night if needed Wear brace with activity  Exercises daily Ice 20 minutes 2 times a day Come back in 3 weeks and if not better will do injection.

## 2013-05-05 ENCOUNTER — Other Ambulatory Visit: Payer: Self-pay

## 2013-05-15 ENCOUNTER — Other Ambulatory Visit: Payer: Self-pay | Admitting: Internal Medicine

## 2013-05-25 ENCOUNTER — Ambulatory Visit: Payer: 59 | Admitting: Internal Medicine

## 2013-05-31 ENCOUNTER — Ambulatory Visit: Payer: 59 | Admitting: Family Medicine

## 2013-06-01 ENCOUNTER — Other Ambulatory Visit: Payer: Self-pay | Admitting: Orthopedic Surgery

## 2013-06-01 DIAGNOSIS — M5412 Radiculopathy, cervical region: Secondary | ICD-10-CM

## 2013-06-05 ENCOUNTER — Ambulatory Visit
Admission: RE | Admit: 2013-06-05 | Discharge: 2013-06-05 | Disposition: A | Discharge status: Home / Self Care | Payer: 59 | Source: Ambulatory Visit | Attending: Orthopedic Surgery | Admitting: Orthopedic Surgery

## 2013-06-05 DIAGNOSIS — M5412 Radiculopathy, cervical region: Secondary | ICD-10-CM

## 2013-06-06 ENCOUNTER — Ambulatory Visit (INDEPENDENT_AMBULATORY_CARE_PROVIDER_SITE_OTHER): Payer: 59 | Admitting: Family Medicine

## 2013-06-06 ENCOUNTER — Encounter: Payer: Self-pay | Admitting: Family Medicine

## 2013-06-06 VITALS — BP 140/38 | HR 74

## 2013-06-06 DIAGNOSIS — IMO0002 Reserved for concepts with insufficient information to code with codable children: Secondary | ICD-10-CM

## 2013-06-06 DIAGNOSIS — S83206A Unspecified tear of unspecified meniscus, current injury, right knee, initial encounter: Secondary | ICD-10-CM | POA: Insufficient documentation

## 2013-06-06 DIAGNOSIS — M1712 Unilateral primary osteoarthritis, left knee: Secondary | ICD-10-CM

## 2013-06-06 DIAGNOSIS — M25569 Pain in unspecified knee: Secondary | ICD-10-CM

## 2013-06-06 DIAGNOSIS — M25562 Pain in left knee: Secondary | ICD-10-CM

## 2013-06-06 DIAGNOSIS — M1710 Unilateral primary osteoarthritis, unspecified knee: Secondary | ICD-10-CM

## 2013-06-06 DIAGNOSIS — Z5189 Encounter for other specified aftercare: Secondary | ICD-10-CM

## 2013-06-06 DIAGNOSIS — S83412D Sprain of medial collateral ligament of left knee, subsequent encounter: Secondary | ICD-10-CM

## 2013-06-06 MED ORDER — MELOXICAM 15 MG PO TABS: 15.0000 mg | ORAL_TABLET | Freq: Every day | ORAL | Status: DC

## 2013-06-06 NOTE — Assessment & Plan Note (Addendum)
Patient's MCL seems to be healed but unfortunately continues to have symptoms that correlate very well with the meniscal tear that was previously seen on ultrasound. Patient given an injection today. Patient will continue with conservative therapy measures. Patient will follow up again in 4 weeks for further evaluation. She continues to have pain at that time or any mechanical symptoms we need to consider further imaging versus formal physical therapy to help.

## 2013-06-06 NOTE — Progress Notes (Signed)
Pre-visit discussion using our clinic review tool. No additional management support is needed unless otherwise documented below in the visit note.  

## 2013-06-06 NOTE — Patient Instructions (Signed)
Continue exercises daily Wear brace for support when needed.  Come back again in 4 weeks.

## 2013-06-06 NOTE — Progress Notes (Signed)
CC: Left knee pain followup  HPI: Patient is a very pleasant 47 year old obese female  Coming in for followup of her left knee pain. Patient states that it is improving and she is approximately 30-40% better. Patient states she still has medial knee pain especially with certain twisting motions are going downstairs. Patient denies any swelling, denies any new symptoms such as radiation down the leg. Patient has been wearing the brace on regular days. Patient denies that the knee hasn't given out on her. Continues to meloxicam regularly.   Past medical, surgical, family and social history reviewed. Medications reviewed all in the electronic medical record.   Review of Systems: No headache, visual changes, nausea, vomiting, diarrhea, constipation, dizziness, abdominal pain, skin rash, fevers, chills, night sweats, weight loss, swollen lymph nodes, body aches, joint swelling, muscle aches, chest pain, shortness of breath, mood changes.   Objective:    Blood pressure 140/38, pulse 74, SpO2 96.00%.   General: No apparent distress alert and oriented x3 mood and affect normal, dressed appropriately. Obese.  HEENT: Pupils equal, extraocular movements intact Respiratory: Patient's speak in full sentences and does not appear short of breath Cardiovascular: No lower extremity edema, non tender, no erythema Skin: Warm dry intact with no signs of infection or rash on extremities or on axial skeleton. Abdomen: Soft nontender Neuro: Cranial nerves II through XII are intact, neurovascularly intact in all extremities with 2+ DTRs and 2+ pulses. Lymph: No lymphadenopathy of posterior or anterior cervical chain or axillae bilaterally.  Gait mild antalgic gait  MSK: Non tender with full range of motion and good stability and symmetric strength and tone of shoulders, elbows, wrist, hip and ankles bilaterally.  Knee: Left Normal to inspection with no erythema or effusion or obvious bony abnormalities. Severe  tenderness to palpation over the medial joint line and the MCL. ROM full in flexion and extension and lower leg rotation. Ligaments with solid consistent endpoints including ACL, PCL, LCL, MCL. Patient though does have pain with stressing the MCL. Positive Mcmurray's, Apley's, and Thessalonian tests. Mild painful patellar compression. Patellar glide with mild crepitus. Patellar and quadriceps tendons unremarkable. Hamstring and quadriceps strength is normal.   After informed written and verbal consent, patient was seated on exam table. Right knee was prepped with alcohol swab and utilizing anterolateral approach, patient's right knee space was injected with 4:1  marcaine 0.5%: Kenalog 40mg /dL. Patient tolerated the procedure well without immediate complications. This is done under ultrasound guidance secondary to patient's body habitus.    Impression and Recommendations:     This case required medical decision making of moderate complexity.

## 2013-06-10 ENCOUNTER — Encounter: Payer: Self-pay | Admitting: Internal Medicine

## 2013-06-10 ENCOUNTER — Ambulatory Visit (INDEPENDENT_AMBULATORY_CARE_PROVIDER_SITE_OTHER): Payer: 59 | Admitting: Internal Medicine

## 2013-06-10 VITALS — BP 168/98 | HR 76 | Temp 98.9°F | Resp 16 | Wt 330.0 lb

## 2013-06-10 DIAGNOSIS — G4736 Sleep related hypoventilation in conditions classified elsewhere: Secondary | ICD-10-CM

## 2013-06-10 DIAGNOSIS — J45909 Unspecified asthma, uncomplicated: Secondary | ICD-10-CM

## 2013-06-10 MED ORDER — AZITHROMYCIN 250 MG PO TABS: ORAL_TABLET | ORAL | Status: DC

## 2013-06-10 MED ORDER — PROMETHAZINE-CODEINE 6.25-10 MG/5ML PO SYRP: 5.0000 mL | ORAL_SOLUTION | ORAL | Status: DC | PRN

## 2013-06-10 NOTE — Patient Instructions (Signed)
Wt Readings from Last 3 Encounters:  06/10/13 330 lb (149.687 kg)  05/03/13 343 lb (155.584 kg)  04/01/13 332 lb (150.594 kg)   Use over-the-counter  "cold" medicines  such as "Afrin" nasal spray for nasal congestion as directed instead. Use" Delsym" or" Robitussin" cough syrup varietis for cough.  You can use plain "Tylenol" or "Advil" for fever, chills and achyness.  Please, make an appointment if you are not better or if you're worse.

## 2013-06-10 NOTE — Progress Notes (Signed)
Pre visit review using our clinic review tool, if applicable. No additional management support is needed unless otherwise documented below in the visit note. 

## 2013-06-10 NOTE — Assessment & Plan Note (Signed)
On O2 

## 2013-06-10 NOTE — Assessment & Plan Note (Signed)
Acute Zpac Prom-cod syr

## 2013-06-12 ENCOUNTER — Encounter: Payer: Self-pay | Admitting: Internal Medicine

## 2013-06-12 NOTE — Progress Notes (Signed)
   Subjective:    Patient ID: Autumn Massey, female    DOB: 03-23-1966, 47 y.o.   MRN: [id number]  Cough This is a new problem. The current episode started in the past 7 days. The problem occurs every few minutes. The cough is productive of sputum. Associated symptoms include nasal congestion, postnasal drip and wheezing. The treatment provided mild relief. Her past medical history is significant for asthma.      Review of Systems  HENT: Positive for postnasal drip.   Respiratory: Positive for cough and wheezing.        Objective:   Physical Exam  Constitutional: She appears well-developed. No distress.  Obese  HENT:  Head: Normocephalic.  Right Ear: External ear normal.  Left Ear: External ear normal.  Nose: Nose normal.  Mouth/Throat: Oropharynx is clear and moist.  eryth throat  Eyes: Conjunctivae are normal. Pupils are equal, round, and reactive to light. Right eye exhibits no discharge. Left eye exhibits no discharge.  Neck: Normal range of motion. Neck supple. No JVD present. No tracheal deviation present. No thyromegaly present.  Cardiovascular: Normal rate, regular rhythm and normal heart sounds.   Pulmonary/Chest: No stridor. No respiratory distress. She has no wheezes.  Abdominal: Soft. Bowel sounds are normal. She exhibits no distension and no mass. There is no tenderness. There is no rebound and no guarding.  Musculoskeletal: She exhibits no edema and no tenderness.  Lymphadenopathy:    She has no cervical adenopathy.  Neurological: She displays normal reflexes. No cranial nerve deficit. She exhibits normal muscle tone. Coordination normal.  Skin: No rash noted. No erythema.  Psychiatric: She has a normal mood and affect. Her behavior is normal. Judgment and thought content normal.          Assessment & Plan:

## 2013-06-15 ENCOUNTER — Telehealth: Payer: Self-pay | Admitting: *Deleted

## 2013-06-15 NOTE — Telephone Encounter (Signed)
Pt states she is feeling much better. However, she now has thrush. She is requesting Rx for this sent to [facility] [address]

## 2013-06-16 MED ORDER — NYSTATIN 100000 UNIT/ML MT SUSP: 500000.0000 [IU] | Freq: Four times a day (QID) | OROMUCOSAL | Status: DC

## 2013-06-16 NOTE — Telephone Encounter (Signed)
Nystatin sent.  Thx

## 2013-06-16 NOTE — Telephone Encounter (Signed)
Left detailed mess informing pt of Rx.  

## 2013-07-04 ENCOUNTER — Ambulatory Visit: Payer: 59 | Admitting: Family Medicine

## 2013-07-08 ENCOUNTER — Ambulatory Visit (INDEPENDENT_AMBULATORY_CARE_PROVIDER_SITE_OTHER): Payer: 59 | Admitting: Family Medicine

## 2013-07-08 ENCOUNTER — Encounter: Payer: Self-pay | Admitting: Family Medicine

## 2013-07-08 VITALS — BP 128/80 | HR 90

## 2013-07-08 DIAGNOSIS — S83206A Unspecified tear of unspecified meniscus, current injury, right knee, initial encounter: Secondary | ICD-10-CM

## 2013-07-08 DIAGNOSIS — IMO0002 Reserved for concepts with insufficient information to code with codable children: Secondary | ICD-10-CM

## 2013-07-08 NOTE — Progress Notes (Signed)
Pre-visit discussion using our clinic review tool. No additional management support is needed unless otherwise documented below in the visit note.  

## 2013-07-08 NOTE — Assessment & Plan Note (Signed)
Patient is doing remarkably well and this time and is asymptomatic. Patient will continue the home exercises 2-3 times a week. Encourage to lose weight which will make a drastic change. Patient will start increasing her activities with walking on a treadmill. Patient can follow up on an as-needed basis. Patient was told to avoid any twisting motions for the next month still.

## 2013-07-08 NOTE — Progress Notes (Signed)
CC: Left knee pain followup  HPI: Patient is a very pleasant 48 year old obese female  Coming in for followup of her left knee pain. At last visit patient was doing somewhat better but still is having considerable amount of pain so underwent a steroid injection for questionable meniscal injury. Patient was to continue home exercises, work on weight loss,icing as well as brace. Patient states since last visit she's been feeling wonderful. Patient states that she has only 0.01% pain. Patient states certain movements to capture but overall she is able to do all her activities and has started working out. Patient is encourage because she would like to lose weight. Overall she is very happy with the results. Continuing to do her exercises, no longer taking meloxicam a regular basis.   Past medical, surgical, family and social history reviewed. Medications reviewed all in the electronic medical record.   Review of Systems: No headache, visual changes, nausea, vomiting, diarrhea, constipation, dizziness, abdominal pain, skin rash, fevers, chills, night sweats, weight loss, swollen lymph nodes, body aches, joint swelling, muscle aches, chest pain, shortness of breath, mood changes.   Objective:    Blood pressure 128/80, pulse 90, SpO2 93.00%.   General: No apparent distress alert and oriented x3 mood and affect normal, dressed appropriately. Obese.  HEENT: Pupils equal, extraocular movements intact Respiratory: Patient's speak in full sentences and does not appear short of breath Cardiovascular: No lower extremity edema, non tender, no erythema Skin: Warm dry intact with no signs of infection or rash on extremities or on axial skeleton. Abdomen: Soft nontender Neuro: Cranial nerves II through XII are intact, neurovascularly intact in all extremities with 2+ DTRs and 2+ pulses. Lymph: No lymphadenopathy of posterior or anterior cervical chain or axillae bilaterally.  Gait mild antalgic gait  MSK: Non  tender with full range of motion and good stability and symmetric strength and tone of shoulders, elbows, wrist, hip and ankles bilaterally.  Knee: Left Normal to inspection with no erythema or effusion or obvious bony abnormalities. nontenderness to palpation over the medial joint line and the MCL. ROM full in flexion and extension and lower leg rotation. Ligaments with solid consistent endpoints including ACL, PCL, LCL, MCL. Patient though does have pain with stressing the MCL. Negative Mcmurray's, Apley's, and Thessalonian tests. Mild painful patellar compression. Patellar glide with mild crepitus. Patellar and quadriceps tendons unremarkable. Hamstring and quadriceps strength is normal.     Impression and Recommendations:     This case required medical decision making of moderate complexity.

## 2013-07-08 NOTE — Patient Instructions (Signed)
You are doing great! No Zumba for 1 month, all other exercises are good to go! Continue your knee exercises 2-3 times a week.  Ice after activity can be helpful  Any flare take meloxicam daily for 3 days.  See me when you need me.

## 2013-07-29 ENCOUNTER — Other Ambulatory Visit: Payer: Self-pay | Admitting: Neurology

## 2013-07-29 DIAGNOSIS — R51 Headache: Secondary | ICD-10-CM

## 2013-07-29 DIAGNOSIS — H9190 Unspecified hearing loss, unspecified ear: Secondary | ICD-10-CM

## 2013-07-29 DIAGNOSIS — R519 Headache, unspecified: Secondary | ICD-10-CM

## 2013-08-05 ENCOUNTER — Ambulatory Visit
Admission: RE | Admit: 2013-08-05 | Discharge: 2013-08-05 | Disposition: A | Discharge status: Home / Self Care | Payer: 59 | Source: Ambulatory Visit | Attending: Neurology | Admitting: Neurology

## 2013-08-05 DIAGNOSIS — R51 Headache: Secondary | ICD-10-CM

## 2013-08-05 DIAGNOSIS — H9190 Unspecified hearing loss, unspecified ear: Secondary | ICD-10-CM

## 2013-08-05 DIAGNOSIS — R519 Headache, unspecified: Secondary | ICD-10-CM

## 2013-08-08 ENCOUNTER — Telehealth: Payer: Self-pay

## 2013-08-08 ENCOUNTER — Other Ambulatory Visit: Payer: 59

## 2013-08-08 NOTE — Telephone Encounter (Signed)
Patient went to the lab today. Blanch Media from lab left a message stating they needed lab orders. Please advise?

## 2013-08-09 NOTE — Telephone Encounter (Signed)
I cant tell anymore from the computer if the pt has insurance or what kind; if has medicare cannot have labs done prior  If has private [employer], we could order labs prior

## 2013-08-09 NOTE — Telephone Encounter (Signed)
Called the patient and she had forgotten she had to wait until after appointment to get labs.

## 2013-08-11 ENCOUNTER — Ambulatory Visit (INDEPENDENT_AMBULATORY_CARE_PROVIDER_SITE_OTHER): Payer: 59 | Admitting: Internal Medicine

## 2013-08-11 ENCOUNTER — Encounter: Payer: Self-pay | Admitting: Internal Medicine

## 2013-08-11 ENCOUNTER — Other Ambulatory Visit (INDEPENDENT_AMBULATORY_CARE_PROVIDER_SITE_OTHER): Payer: 59

## 2013-08-11 VITALS — BP 132/80 | HR 86 | Temp 98.6°F | Ht 62.0 in | Wt 352.2 lb

## 2013-08-11 DIAGNOSIS — Z0000 Encounter for general adult medical examination without abnormal findings: Secondary | ICD-10-CM

## 2013-08-11 DIAGNOSIS — R7309 Other abnormal glucose: Secondary | ICD-10-CM

## 2013-08-11 DIAGNOSIS — Z8673 Personal history of transient ischemic attack (TIA), and cerebral infarction without residual deficits: Secondary | ICD-10-CM | POA: Insufficient documentation

## 2013-08-11 DIAGNOSIS — Z23 Encounter for immunization: Secondary | ICD-10-CM

## 2013-08-11 DIAGNOSIS — R7302 Impaired glucose tolerance (oral): Secondary | ICD-10-CM

## 2013-08-11 LAB — HEPATIC FUNCTION PANEL
ALBUMIN: 3.9 g/dL (ref 3.5–5.2)
ALT: 28 U/L (ref 0–35)
AST: 26 U/L (ref 0–37)
Alkaline Phosphatase: 63 U/L (ref 39–117)
Bilirubin, Direct: 0.1 mg/dL (ref 0.0–0.3)
TOTAL PROTEIN: 7.4 g/dL (ref 6.0–8.3)
Total Bilirubin: 0.7 mg/dL (ref 0.3–1.2)

## 2013-08-11 LAB — BASIC METABOLIC PANEL
BUN: 11 mg/dL (ref 6–23)
CALCIUM: 9.2 mg/dL (ref 8.4–10.5)
CO2: 33 meq/L — AB (ref 19–32)
CREATININE: 0.6 mg/dL (ref 0.4–1.2)
Chloride: 99 mEq/L (ref 96–112)
GFR: 120.49 mL/min (ref >60.00)
GLUCOSE: 117 mg/dL — AB (ref 70–99)
Potassium: 4 mEq/L (ref 3.5–5.1)
Sodium: 140 mEq/L (ref 135–145)

## 2013-08-11 LAB — CBC WITH DIFFERENTIAL/PLATELET
BASOS ABS: 0 10*3/uL (ref 0.0–0.1)
Basophils Relative: 0.3 % (ref 0.0–3.0)
Eosinophils Absolute: 0.1 10*3/uL (ref 0.0–0.7)
Eosinophils Relative: 1.7 % (ref 0.0–5.0)
HCT: 47.9 % — ABNORMAL HIGH (ref 36.0–46.0)
Hemoglobin: 15.8 g/dL — ABNORMAL HIGH (ref 12.0–15.0)
LYMPHS PCT: 14.3 % (ref 12.0–46.0)
Lymphs Abs: 1.2 10*3/uL (ref 0.7–4.0)
MCHC: 32.9 g/dL (ref 30.0–36.0)
MCV: 95.3 fl (ref 78.0–100.0)
MONOS PCT: 7.5 % (ref 3.0–12.0)
Monocytes Absolute: 0.6 10*3/uL (ref 0.1–1.0)
NEUTROS PCT: 76.2 % (ref 43.0–77.0)
Neutro Abs: 6.3 10*3/uL (ref 1.4–7.7)
PLATELETS: 188 10*3/uL (ref 150.0–400.0)
RBC: 5.03 Mil/uL (ref 3.87–5.11)
RDW: 14.5 % (ref 11.5–14.6)
WBC: 8.3 10*3/uL (ref 4.5–10.5)

## 2013-08-11 LAB — URINALYSIS, ROUTINE W REFLEX MICROSCOPIC
Bilirubin Urine: NEGATIVE
Ketones, ur: NEGATIVE
Nitrite: NEGATIVE
RBC / HPF: NONE SEEN (ref 0–?)
SPECIFIC GRAVITY, URINE: 1.025 (ref 1.000–1.030)
Total Protein, Urine: NEGATIVE
UROBILINOGEN UA: 0.2 (ref 0.0–1.0)
Urine Glucose: NEGATIVE
pH: 6 (ref 5.0–8.0)

## 2013-08-11 LAB — LIPID PANEL
CHOL/HDL RATIO: 5
Cholesterol: 162 mg/dL (ref 0–200)
HDL: 33.2 mg/dL — AB (ref >39.00)
LDL Cholesterol: 93 mg/dL (ref 0–99)
TRIGLYCERIDES: 181 mg/dL — AB (ref 0.0–149.0)
VLDL: 36.2 mg/dL (ref 0.0–40.0)

## 2013-08-11 LAB — HEMOGLOBIN A1C: HEMOGLOBIN A1C: 6.9 % — AB (ref 4.6–6.5)

## 2013-08-11 LAB — TSH: TSH: 9.58 u[IU]/mL — AB (ref 0.35–5.50)

## 2013-08-11 MED ORDER — IRBESARTAN 300 MG PO TABS: 300.0000 mg | ORAL_TABLET | Freq: Every day | ORAL | Status: DC

## 2013-08-11 MED ORDER — CARBAMAZEPINE 100 MG PO CHEW: 100.0000 mg | CHEWABLE_TABLET | Freq: Two times a day (BID) | ORAL | Status: DC

## 2013-08-11 MED ORDER — SIMVASTATIN 40 MG PO TABS: 40.0000 mg | ORAL_TABLET | Freq: Every day | ORAL | Status: DC

## 2013-08-11 MED ORDER — LEVOTHYROXINE SODIUM 175 MCG PO TABS
175.0000 ug | ORAL_TABLET | Freq: Every day | ORAL | Status: DC
Start: 2013-08-11 — End: 2013-10-24

## 2013-08-11 MED ORDER — LABETALOL HCL 200 MG PO TABS: 200.0000 mg | ORAL_TABLET | Freq: Two times a day (BID) | ORAL | Status: DC

## 2013-08-11 MED ORDER — HYDROCHLOROTHIAZIDE 25 MG PO TABS: 25.0000 mg | ORAL_TABLET | Freq: Every day | ORAL | Status: DC

## 2013-08-11 MED ORDER — FUROSEMIDE 40 MG PO TABS: 40.0000 mg | ORAL_TABLET | Freq: Every day | ORAL | Status: DC

## 2013-08-11 MED ORDER — FLUOXETINE HCL 20 MG PO CAPS: 20.0000 mg | ORAL_CAPSULE | Freq: Every day | ORAL | Status: DC

## 2013-08-11 NOTE — Patient Instructions (Addendum)
You had the flu shot today Your EKG was OK today Please continue all other medications as before, and refills have been done if requested. Please have the pharmacy call with any other refills you may need. Please continue your efforts at being more active, low cholesterol diet, and weight control. You are otherwise up to date with prevention measures today.  Please go to the LAB in the Basement (turn left off the elevator) for the tests to be done today You will be contacted by phone if any changes need to be made immediately.  Otherwise, you will receive a letter about your results with an explanation, but please check with MyChart first.  Please return in 1 year for your yearly visit, or sooner if needed, with Lab testing done 3-5 days before

## 2013-08-11 NOTE — Progress Notes (Signed)
Pre-visit discussion using our clinic review tool. No additional management support is needed unless otherwise documented below in the visit note.  

## 2013-08-11 NOTE — Assessment & Plan Note (Signed)

## 2013-08-11 NOTE — Assessment & Plan Note (Signed)
stable overall by history and exam, recent data reviewed with pt, and pt to continue medical treatment as before,  to f/u any worsening symptoms or concerns Lab Results  Component Value Date   HGBA1C 6.2 07/19/2012

## 2013-08-11 NOTE — Progress Notes (Signed)
Subjective:    Patient ID: Autumn Massey, female    DOB: 04/09/1966, 48 y.o.   MRN: [id number]  HPI  Here for wellness and f/u;  Overall doing ok;  Pt denies CP, worsening SOB, DOE, wheezing, orthopnea, PND, worsening LE edema, palpitations, dizziness or syncope.  Pt denies neurological change such as new headache, facial or extremity weakness.  Pt denies polydipsia, polyuria, or low sugar symptoms. Pt states overall good compliance with treatment and medications, good tolerability, and has been trying to follow lower cholesterol diet.  Pt denies worsening depressive symptoms, suicidal ideation or panic. No fever, night sweats, wt loss, loss of appetite, or other constitutional symptoms.  Pt states good ability with ADL's, has low fall risk, home safety reviewed and adequate, no other significant changes in hearing or vision, and only occasionally active with exercise. Is trying to do better with treadmil for 20 min 4 days per wk.  Has been trying to do better with diet, but has gained further wt.Pt also on tegretol per Dr Melton Alar but doesnot know the dosing.  Cont's to have "spasms" of left 5th finger, recent MRI per Dr Melton Alar, has seen Dr Fredna Dow as well, next to see [facility] neurology next. Still taking the home o2 2L nocturnal due to hx of polycythemia.  Also had a fall down stairs with partial left MCL tear, symtpoms improved after brace, and cortisone per Dr Tamala Julian. Sched for mammogram soon. May be losing her job soon, though as tomorrow is end of 26 w[address] [employer] disability for her, wants all 27 day rx today Past Medical History  Diagnosis Date  . ANXIETY DEPRESSION 01/29/2009  . DEPRESSION 11/19/2007  . FREQUENCY, URINARY 10/17/2009  . HYPERLIPIDEMIA 11/19/2007  . HYPERTENSION 11/19/2007  . HYPOTHYROIDISM 11/19/2007  . PNEUMONIA 08/06/2010  . POLYCYTHEMIA 01/29/2009  . Preventative health care 04/06/2011  . SLEEP RELATED HYPOVENTILATION/HYPOXEMIA CCE 02/08/2009  . Morbid obesity   . Venous insufficiency  07/19/2012  . Sleep apnea     mild sleep apnea, on o2 at 2l at nighttime   Past Surgical History  Procedure Laterality Date  . Tonsillectomy    . Ulnar tunnel release Left 02/22/2013    Procedure: LEFT ULNAR NERVE DECOMPRESSION ;  Surgeon: Tennis Must, MD;  Location: [place];  Service: Orthopedics;  Laterality: Left;    reports that she has been smoking.  She does not have any smokeless tobacco history on file. She reports that she drinks alcohol. She reports that she does not use illicit drugs. family history includes Alcohol abuse in her other; Arthritis in her other; Cancer in her maternal grandfather and mother; Hypertension in her other. No Known Allergies Current Outpatient Prescriptions on File Prior to Visit  Medication Sig Dispense Refill  . aspirin EC 81 MG tablet Take 81 mg by mouth daily.      . clonazePAM (KLONOPIN) 0.5 MG disintegrating tablet DISSOLVE ONE TABLET IN MOUTH TWICE DAILY AS NEEDED FOR ANXIETY  60 tablet  2  . NON FORMULARY Oxygen 2L use as bedtime      . phentermine 37.5 MG capsule Take 1 capsule (37.5 mg total) by mouth every morning.  30 capsule  2   No current facility-administered medications on file prior to visit.    Review of Systems Constitutional: Negative for diaphoresis, activity change, appetite change or unexpected weight change.  HENT: Negative for hearing loss, ear pain, facial swelling, mouth sores and neck stiffness.   Eyes: Negative for pain,  redness and visual disturbance.  Respiratory: Negative for shortness of breath and wheezing.   Cardiovascular: Negative for chest pain and palpitations.  Gastrointestinal: Negative for diarrhea, blood in stool, abdominal distention or other pain Genitourinary: Negative for hematuria, flank pain or change in urine volume.  Musculoskeletal: Negative for myalgias and joint swelling.  Skin: Negative for color change and wound.  Neurological: Negative for syncope and numbness. other than  noted Hematological: Negative for adenopathy.  Psychiatric/Behavioral: Negative for hallucinations, self-injury, decreased concentration and agitation.      Objective:   Physical Exam BP 132/80  Pulse 86  Temp(Src) 98.6 F (37 C) (Oral)  Ht 5\' 2"  (1.575 m)  Wt 352 lb 4 oz (159.78 kg)  BMI 64.41 kg/m2  SpO2 91%  LMP 08/03/2013 VS noted,  Constitutional: Pt is oriented to person, place, and time./morbid obese  Appears well-developed and well-nourished.  Head: Normocephalic and atraumatic.  Right Ear: External ear normal.  Left Ear: External ear normal.  Nose: Nose normal.  Mouth/Throat: Oropharynx is clear and moist.  Eyes: Conjunctivae and EOM are normal. Pupils are equal, round, and reactive to light.  Neck: Normal range of motion. Neck supple. No JVD present. No tracheal deviation present.  Cardiovascular: Normal rate, regular rhythm, normal heart sounds and intact distal pulses.   Pulmonary/Chest: Effort normal and breath sounds normal.  Abdominal: Soft. Bowel sounds are normal. There is no tenderness. No HSM  Musculoskeletal: Normal range of motion. Exhibits no edema.  Lymphadenopathy:  Has no cervical adenopathy.  Neurological: Pt is alert and oriented to person, place, and time. Pt has normal reflexes. No cranial nerve deficit.  Skin: Skin is warm and dry. No rash noted.  Psychiatric:  Has anxiuos mood and affect. Behavior is normal.     Assessment & Plan:

## 2013-09-15 ENCOUNTER — Telehealth: Payer: Self-pay | Admitting: Internal Medicine

## 2013-09-15 DIAGNOSIS — L659 Nonscarring hair loss, unspecified: Secondary | ICD-10-CM

## 2013-09-15 DIAGNOSIS — L689 Hypertrichosis, unspecified: Secondary | ICD-10-CM

## 2013-09-15 DIAGNOSIS — R635 Abnormal weight gain: Secondary | ICD-10-CM

## 2013-09-15 NOTE — Telephone Encounter (Signed)
Done per emr 

## 2013-09-15 NOTE — Telephone Encounter (Signed)
Pt wants a referral to endocrinology for hair loss and weight gain and facial hair.  Dr. Melton Alar did a brain scan recently and nothing showed up that would explain this. She wants to rule out adrenal problems and cushing's.

## 2013-09-16 NOTE — Telephone Encounter (Signed)
Patient informed referral done.

## 2013-10-18 ENCOUNTER — Encounter: Payer: Self-pay | Admitting: Internal Medicine

## 2013-10-18 ENCOUNTER — Ambulatory Visit (INDEPENDENT_AMBULATORY_CARE_PROVIDER_SITE_OTHER): Payer: 59 | Admitting: Internal Medicine

## 2013-10-18 VITALS — BP 138/80 | HR 90 | Temp 98.1°F | Resp 12 | Ht 62.0 in | Wt 361.8 lb

## 2013-10-18 DIAGNOSIS — L680 Hirsutism: Secondary | ICD-10-CM

## 2013-10-18 DIAGNOSIS — E039 Hypothyroidism, unspecified: Secondary | ICD-10-CM

## 2013-10-18 LAB — TSH: TSH: 10.04 u[IU]/mL — ABNORMAL HIGH (ref 0.35–5.50)

## 2013-10-18 NOTE — Progress Notes (Signed)
Patient ID: Autumn Massey, female   DOB: 11-02-1965, 48 y.o.   MRN: [id number]   HPI  Autumn Massey is a 48 y.o.-year-old female, referred by her PCP, Dr. Jenny Reichmann, for evaluation for hypothyroidism and also, per pt, weight gain and hirsutism.  She c/o : - weight gain: 320-330 lbs >> then increase to 360 in 7 months.  - h/o losing weight 323 lbs >> 299 in summer 2013 - she is on the treadmill every other day  - meals:  - Breakfast: Kashi or Special K cereals; soy or almond milk; bananas; or oatmeal - Lunch: lean cuisine; tuna on whole grain; celery; chicken breast - grilled - Dinner: basked or grilled chicken; lean roast in the crockpot; whole grain pasta + chicken  Occasional fast food - Snacks: yoghurt, beef jerky, pistachios; cashews; chips (pita)    - almost completely eliminated sodas; tea with 50% Equal + 50% sugar  - was on Phentermine, which worked initially, then stopped working  - is considering gastric bypass but only as a last resort - hirsutism >> started to see facial hair in her 30s - hair loss >> female pattern, and bald spot in vertex - no acne - no cold intolerance - no constipation - no dry skin - no hair falling - no depression - + fatigue, would sleep all day  - had a Mirena IUD placed 04/2013 for heavy menstrual cycles  - had a steroid injection x 1 - last year (knee), and has had 3 Prednisone tapers x 3 in last 3 years.  Pt. has been dx with hypothyroidism in 2005; is on Levothyroxine 175 mcg, taken: - fasting - with water or tea - with all the other meds - separated by >30 min from b'fast  - no calcium, iron, PPIs, multivitamins (was on MVI)  I reviewed pt's thyroid tests: Lab Results  Component Value Date   TSH 9.58* 08/11/2013   TSH 4.15 03/15/2012   TSH 3.10 04/04/2011   TSH 6.16* 05/02/2010   TSH 11.07* 02/18/2010   TSH 4.33 01/26/2009   TSH 5.42 11/16/2007    Pt denies feeling nodules in neck, hoarseness, dysphagia/odynophagia, SOB with lying  down.  She has + FH of thyroid disorders in: GM, mother.  No FH of thyroid cancer.  No h/o radiation tx to head or neck.  She has been tested for Cushing's sd. In 02/2012 by a Dexamethasone suppression test >> cortisol returned at 1.4 (normal). She also had normal urine CA and MN (testing for 2nd HTN) >> no pheochromocytoma.  ROS: Constitutional: see HPI Eyes: no blurry vision, no xerophthalmia ENT: no sore throat, no nodules palpated in throat, no dysphagia/odynophagia, no hoarseness Cardiovascular: no CP/SOB/palpitations/+ leg swelling Respiratory: no cough/+ SOB/+ wheezing Gastrointestinal: no N/V/D/C Musculoskeletal: no muscle/+ joint aches Skin: + rashes (legs) Neurological: + tremors/no numbness/tingling/dizziness Psychiatric: no depression/anxiety  Past Medical History  Diagnosis Date  . ANXIETY DEPRESSION 01/29/2009  . DEPRESSION 11/19/2007  . FREQUENCY, URINARY 10/17/2009  . HYPERLIPIDEMIA 11/19/2007  . HYPERTENSION 11/19/2007  . HYPOTHYROIDISM 11/19/2007  . PNEUMONIA 08/06/2010  . POLYCYTHEMIA 01/29/2009  . Preventative health care 04/06/2011  . SLEEP RELATED HYPOVENTILATION/HYPOXEMIA CCE 02/08/2009  . Morbid obesity   . Venous insufficiency 07/19/2012  . Sleep apnea     mild sleep apnea, on o2 at 2l at nighttime   Past Surgical History  Procedure Laterality Date  . Tonsillectomy    . Ulnar tunnel release Left 02/22/2013    Procedure: LEFT ULNAR NERVE DECOMPRESSION ;  Surgeon: Tennis Must, MD;  Location: [facility];  Service: Orthopedics;  Laterality: Left;   History   Social History  . Marital Status: Divorced    Spouse Name: N/A    Number of Children: 0  . Years of Education: 12   Occupational History  . customer serv. manager [employer]    Social History Main Topics  . Smoking status: Current Every Day Smoker -- 0.25 packs/day  . Smokeless tobacco: Not on file     Comment: started at age 53, less than 1 ppd.  . Alcohol Use: Yes      Comment: rare  . Drug Use: No  . Sexual Activity: Yes    Birth Control/ Protection: IUD   Current Outpatient Prescriptions on File Prior to Visit  Medication Sig Dispense Refill  . aspirin EC 81 MG tablet Take 81 mg by mouth daily.      . carbamazepine (TEGRETOL) 100 MG chewable tablet Chew 1 tablet (100 mg total) by mouth 2 (two) times daily.  60 tablet  11  . clonazePAM (KLONOPIN) 0.5 MG disintegrating tablet DISSOLVE ONE TABLET IN MOUTH TWICE DAILY AS NEEDED FOR ANXIETY  60 tablet  2  . FLUoxetine (PROZAC) 20 MG capsule Take 1 capsule (20 mg total) by mouth daily.  90 capsule  3  . furosemide (LASIX) 40 MG tablet Take 1 tablet (40 mg total) by mouth daily.  90 tablet  3  . hydrochlorothiazide (HYDRODIURIL) 25 MG tablet Take 1 tablet (25 mg total) by mouth daily.  90 tablet  3  . irbesartan (AVAPRO) 300 MG tablet Take 1 tablet (300 mg total) by mouth at bedtime.  90 tablet  3  . labetalol (NORMODYNE) 200 MG tablet Take 1 tablet (200 mg total) by mouth 2 (two) times daily.  180 tablet  3  . levothyroxine (SYNTHROID, LEVOTHROID) 175 MCG tablet Take 1 tablet (175 mcg total) by mouth daily.  90 tablet  3  . NON FORMULARY Oxygen 2L use as bedtime      . phentermine 37.5 MG capsule Take 1 capsule (37.5 mg total) by mouth every morning.  30 capsule  2  . simvastatin (ZOCOR) 40 MG tablet Take 1 tablet (40 mg total) by mouth daily.  90 tablet  3   No current facility-administered medications on file prior to visit.   No Known Allergies Family History  Problem Relation Age of Onset  . Cancer Mother     melanoma   . Cancer Maternal Grandfather     prostate cancer  . Alcohol abuse Other   . Arthritis Other   . Hypertension Other    PE: BP 138/80  Pulse 90  Temp(Src) 98.1 F (36.7 C) (Oral)  Resp 12  Ht 5\' 2"  (1.575 m)  Wt 361 lb 12.8 oz (164.111 kg)  BMI 66.16 kg/m2  SpO2 94% Wt Readings from Last 3 Encounters:  10/18/13 361 lb 12.8 oz (164.111 kg)  08/11/13 352 lb 4 oz (159.78  kg)  06/10/13 330 lb (149.687 kg)   Constitutional: obesity class 3 (see below), in NAD, + full subclavic. Fat pads Eyes: PERRLA, EOMI, no exophthalmos ENT: moist mucous membranes, no thyromegaly, no cervical lymphadenopathy Cardiovascular: RRR, No MRG Respiratory: CTA B Gastrointestinal: abdomen soft, NT, ND, BS+ Musculoskeletal: no deformities, strength intact in all 4 Skin: moist, warm, + rash: stasis dermatitis on legs, Hirsutism (shaves) on chin, female-pattern baldness on forehead; + skin tags Neurological: no tremor with outstretched hands, DTR  normal in all 4  BMI Classification:  < 18.5 underweight   18.5-24.9 normal weight   25.0-29.9 overweight   30.0-34.9 class I obesity   35.0-39.9 class II obesity   ? 40.0 class III obesity   ASSESSMENT: 1. Hypothyroidism  2. Weight gain  3. Hirsutism  PLAN:  1. Patient with long-standing hypothyroidism, on levothyroxine therapy. She has weight gain and fatigue, which can be attributed to hypothyroidism. - We discussed about correct intake of levothyroxine, fasting, with water, separated by at least 30 minutes from breakfast, and separated by more than 4 hours from calcium, iron, multivitamins, acid reflux medications (PPIs). - She does not appear to have a goiter, thyroid nodules, or neck compression symptoms - we'll check thyroid tests today: TSH, free T4 - If these are abnormal, she will need to return in 6-8 weeks for repeat labs - If these are normal, I will see her back in 6 months  2. Weight gain - possibly related to hypothyroidism, but not entirely - will check her for Cushing's sd. but unlikely since recent 02/2012 DST was normal - I believe she needs GBP surgery.   3. Hirsutism and female-pattern baldness - check testosterone, Androstenedione and DHEAS - cannot check LH and FSH since on hormonal IUD   Office Visit on 10/18/2013  Component Date Value Ref Range Status  . TSH 10/18/2013 10.04* 0.35 - 5.50 uIU/mL  Final  . Free T4 10/18/2013 0.75  0.60 - 1.60 ng/dL Final  . Testosterone 10/18/2013 78* 10 - 70 ng/dL Final   Comment:           T[facility] Stage       Female              Female                                        I              < 30 ng/dL        < 10 ng/dL                                        II             < 150 ng/dL       < 30 ng/dL                                        III            100-320 ng/dL     < 35 ng/dL                                        IV             200-970 ng/dL     15-40 ng/dL                                        V/Adult        300-890 ng/dL  10-70 ng/dL                             . Sex Hormone Binding 10/18/2013 28  18 - 114 nmol/L Final  . Testosterone, Free 10/18/2013 15.6* 0.6 - 6.8 pg/mL Final   Comment:                            The concentration of free testosterone is derived from a mathematical                          expression based on constants for the binding of testosterone to sex                          hormone-binding globulin and albumin.  . Testosterone-% Free 10/18/2013 2.0  0.4 - 2.4 % Final  . Androstenedione 10/18/2013 93   Final   Comment:                            Adult Female Reference Ranges for                            Androstenedione, Serum:                                                        Follicular Phase:      58-099 ng/dL                             Luteal Phase:          30-235 ng/dL                             Postmenopausal Phase:  20-75  ng/dL  . DHEA-SO4 10/18/2013 35  35 - 430 ug/dL Final   High testosterone >> likely retrospective dx of PCOS.  Will increase LT4 to 200 mcg and have her back for labs in 5-6 weeks.   Component     Latest Ref Rng 10/25/2013  Cortisol (Ur), Free     4.0 - 50.0 mcg/24 h 26.4  Results received     0.63 - 2.50 g/24 h 1.83  Creatinine, Urine      59.9  Creatinine, 24H Ur     700 - 1800 mg/day 1798   Msg sent: Dear Ms [name], No signs of Cushing's syndrome. Your  testosterone is high, and we can try a medication called Spironolactone for this: I would start at 25 mg 2x a day and advance to 50 mg 2x a day. This is a blood pressure med, but really helps with excess hair. If we start this, I will add a potassium check for when you come back for thyroid labs, as this can increase potassium. Please let me know if you like me to send this to your pharmacy.  Sincerely, Philemon Kingdom MD

## 2013-10-18 NOTE — Patient Instructions (Signed)
Please stop at the lab. I will send you the results through [place]. Please collect a 24h urine:  Patient information (Up-to-Date): Collection of a 24-hour urine specimen   - You should collect every drop of urine during each 24-hour period. It does not matter how much or little urine is passed each time, as long as every drop is collected. - Begin the urine collection in the morning after you wake up, after you have emptied your bladder for the first time. - Urinate (empty the bladder) for the first time and flush it down the toilet. Note the exact time (eg, 6:15 AM). You will begin the urine collection at this time. - Collect every drop of urine during the day and night in an empty collection bottle. Store the bottle at room temperature or in the refrigerator. - If you need to have a bowel movement, any urine passed with the bowel movement should be collected. Try not to include feces with the urine collection. If feces does get mixed in, do not try to remove the feces from the urine collection bottle. - Finish by collecting the first urine passed the next morning, adding it to the collection bottle. This should be within ten minutes before or after the time of the first morning void on the first day (which was flushed). In this example, you would try to void between 6:05 and 6:25 on the second day. - If you need to urinate one hour before the final collection time, drink a full glass of water so that you can void again at the appropriate time. If you have to urinate 20 minutes before, try to hold the urine until the proper time. - Please note the exact time of the final collection, even if it is not the same time as when collection began on day 1. - The bottle(s) may be kept at room temperature for a day or two, but should be kept cool or refrigerated for longer periods of time.  Please return in 6 months for a thyroid check.

## 2013-10-19 LAB — TESTOSTERONE, FREE, TOTAL, SHBG
Sex Hormone Binding: 28 nmol/L (ref 18–114)
TESTOSTERONE-% FREE: 2 % (ref 0.4–2.4)
TESTOSTERONE: 78 ng/dL — AB (ref 10–70)
Testosterone, Free: 15.6 pg/mL — ABNORMAL HIGH (ref 0.6–6.8)

## 2013-10-19 LAB — T4, FREE: Free T4: 0.75 ng/dL (ref 0.60–1.60)

## 2013-10-19 LAB — DHEA-SULFATE: DHEA-SO4: 35 ug/dL (ref 35–430)

## 2013-10-22 LAB — ANDROSTENEDIONE: ANDROSTENEDIONE: 93 ng/dL

## 2013-10-24 ENCOUNTER — Encounter: Payer: Self-pay | Admitting: Internal Medicine

## 2013-10-24 MED ORDER — LEVOTHYROXINE SODIUM 200 MCG PO TABS: 200.0000 ug | ORAL_TABLET | Freq: Every day | ORAL | Status: DC

## 2013-10-25 ENCOUNTER — Other Ambulatory Visit: Payer: 59

## 2013-10-26 LAB — CREATININE, URINE, 24 HOUR
Creatinine, 24H Ur: 1798 mg/d (ref 700–1800)
Creatinine, Urine: 59.9 mg/dL

## 2013-10-31 ENCOUNTER — Encounter: Payer: Self-pay | Admitting: Internal Medicine

## 2013-10-31 DIAGNOSIS — E282 Polycystic ovarian syndrome: Secondary | ICD-10-CM | POA: Insufficient documentation

## 2013-10-31 LAB — CORTISOL, URINE, 24 HOUR
Cortisol (Ur), Free: 26.4 mcg/24 h (ref 4.0–50.0)
RESULTS RECEIVED: 1.83 g/(24.h) (ref 0.63–2.50)

## 2013-10-31 MED ORDER — SPIRONOLACTONE 50 MG PO TABS: 50.0000 mg | ORAL_TABLET | Freq: Two times a day (BID) | ORAL | Status: DC

## 2013-11-29 ENCOUNTER — Ambulatory Visit: Payer: 59 | Admitting: *Deleted

## 2013-11-29 ENCOUNTER — Other Ambulatory Visit (INDEPENDENT_AMBULATORY_CARE_PROVIDER_SITE_OTHER): Payer: 59

## 2013-11-29 VITALS — BP 122/68 | HR 84 | Resp 16 | Wt 340.0 lb

## 2013-11-29 DIAGNOSIS — I10 Essential (primary) hypertension: Secondary | ICD-10-CM

## 2013-11-29 DIAGNOSIS — E039 Hypothyroidism, unspecified: Secondary | ICD-10-CM

## 2013-11-29 DIAGNOSIS — L680 Hirsutism: Secondary | ICD-10-CM

## 2013-11-29 LAB — POTASSIUM: POTASSIUM: 5.3 meq/L — AB (ref 3.5–5.1)

## 2013-11-29 LAB — TSH: TSH: 4.88 u[IU]/mL — ABNORMAL HIGH (ref 0.35–4.50)

## 2013-11-30 ENCOUNTER — Other Ambulatory Visit: Payer: Self-pay | Admitting: Internal Medicine

## 2013-11-30 DIAGNOSIS — L680 Hirsutism: Secondary | ICD-10-CM

## 2013-11-30 DIAGNOSIS — E039 Hypothyroidism, unspecified: Secondary | ICD-10-CM

## 2013-11-30 LAB — T4, FREE: Free T4: 0.86 ng/dL (ref 0.60–1.60)

## 2013-11-30 MED ORDER — SPIRONOLACTONE 50 MG PO TABS: 25.0000 mg | ORAL_TABLET | Freq: Two times a day (BID) | ORAL | Status: DC

## 2013-12-02 ENCOUNTER — Encounter: Payer: Self-pay | Admitting: Internal Medicine

## 2013-12-02 ENCOUNTER — Other Ambulatory Visit: Payer: Self-pay | Admitting: Internal Medicine

## 2013-12-02 DIAGNOSIS — L680 Hirsutism: Secondary | ICD-10-CM

## 2013-12-02 DIAGNOSIS — E039 Hypothyroidism, unspecified: Secondary | ICD-10-CM

## 2013-12-02 MED ORDER — LEVOTHYROXINE SODIUM 112 MCG PO TABS: 224.0000 ug | ORAL_TABLET | Freq: Every day | ORAL | Status: DC

## 2013-12-08 NOTE — Telephone Encounter (Signed)
Please call the Levothyroxine 112 mcg twice daily med to [facility] thank you pelase call pt when this is doen

## 2013-12-13 ENCOUNTER — Other Ambulatory Visit: Payer: Self-pay | Admitting: Internal Medicine

## 2013-12-13 DIAGNOSIS — E039 Hypothyroidism, unspecified: Secondary | ICD-10-CM

## 2013-12-13 MED ORDER — LEVOTHYROXINE SODIUM 112 MCG PO TABS: 224.0000 ug | ORAL_TABLET | Freq: Every day | ORAL | Status: DC

## 2014-01-10 ENCOUNTER — Other Ambulatory Visit (INDEPENDENT_AMBULATORY_CARE_PROVIDER_SITE_OTHER): Payer: 59

## 2014-01-10 DIAGNOSIS — L680 Hirsutism: Secondary | ICD-10-CM

## 2014-01-10 DIAGNOSIS — E039 Hypothyroidism, unspecified: Secondary | ICD-10-CM

## 2014-01-10 LAB — TSH: TSH: 1.91 u[IU]/mL (ref 0.35–4.50)

## 2014-01-10 LAB — T4, FREE: Free T4: 1.16 ng/dL (ref 0.60–1.60)

## 2014-01-10 LAB — POTASSIUM: Potassium: 4.5 mEq/L (ref 3.5–5.1)

## 2014-01-12 ENCOUNTER — Other Ambulatory Visit: Payer: Self-pay | Admitting: Internal Medicine

## 2014-01-12 DIAGNOSIS — L680 Hirsutism: Secondary | ICD-10-CM

## 2014-01-12 NOTE — Progress Notes (Signed)
Component     Latest Ref Rng 01/10/2014  Potassium     3.5 - 5.1 mEq/L 4.5  TSH     0.35 - 4.50 uIU/mL 1.91  Free T4     0.60 - 1.60 ng/dL 1.16   We will increase Spironolactone to 25 mg in am and 50 in pm >> recheck potassium in 2 weeks. Continue current dose of LT4.

## 2014-01-24 ENCOUNTER — Other Ambulatory Visit: Payer: 59

## 2014-01-24 ENCOUNTER — Telehealth: Payer: Self-pay

## 2014-01-24 NOTE — Telephone Encounter (Signed)
Needs something in witting that states he referred pt to Dr. Pat Kocher.

## 2014-01-24 NOTE — Telephone Encounter (Signed)
Ok to forward to [facility] 

## 2014-01-26 ENCOUNTER — Encounter: Payer: Self-pay | Admitting: Internal Medicine

## 2014-01-27 ENCOUNTER — Other Ambulatory Visit (INDEPENDENT_AMBULATORY_CARE_PROVIDER_SITE_OTHER): Payer: 59

## 2014-01-27 DIAGNOSIS — L680 Hirsutism: Secondary | ICD-10-CM

## 2014-01-27 DIAGNOSIS — E039 Hypothyroidism, unspecified: Secondary | ICD-10-CM

## 2014-01-27 LAB — T4, FREE: Free T4: 1.38 ng/dL (ref 0.60–1.60)

## 2014-01-27 LAB — POTASSIUM: POTASSIUM: 5.4 meq/L — AB (ref 3.5–5.1)

## 2014-01-27 LAB — TSH: TSH: 0.33 u[IU]/mL — ABNORMAL LOW (ref 0.35–4.50)

## 2014-01-30 ENCOUNTER — Other Ambulatory Visit: Payer: Self-pay | Admitting: Internal Medicine

## 2014-01-30 DIAGNOSIS — E282 Polycystic ovarian syndrome: Secondary | ICD-10-CM

## 2014-01-30 DIAGNOSIS — E039 Hypothyroidism, unspecified: Secondary | ICD-10-CM

## 2014-01-30 NOTE — Telephone Encounter (Signed)
Dr Jenny Reichmann [facility] only back date referral for 5 days prior to appointment

## 2014-01-30 NOTE — Telephone Encounter (Signed)
I dont see how we can help further since [facility] notes this about [place] to let pt know

## 2014-02-01 ENCOUNTER — Encounter: Payer: Self-pay | Admitting: Internal Medicine

## 2014-02-01 ENCOUNTER — Other Ambulatory Visit: Payer: Self-pay | Admitting: *Deleted

## 2014-02-01 MED ORDER — SPIRONOLACTONE 25 MG PO TABS: 25.0000 mg | ORAL_TABLET | Freq: Two times a day (BID) | ORAL | Status: DC

## 2014-02-01 NOTE — Telephone Encounter (Signed)
Called left message to call back 

## 2014-02-01 NOTE — Telephone Encounter (Signed)
Called the patient left a detailed message of MD response.

## 2014-02-26 ENCOUNTER — Other Ambulatory Visit: Payer: Self-pay | Admitting: Internal Medicine

## 2014-03-01 ENCOUNTER — Telehealth: Payer: Self-pay | Admitting: Internal Medicine

## 2014-03-01 DIAGNOSIS — L989 Disorder of the skin and subcutaneous tissue, unspecified: Secondary | ICD-10-CM

## 2014-03-01 DIAGNOSIS — R21 Rash and other nonspecific skin eruption: Secondary | ICD-10-CM

## 2014-03-01 NOTE — Telephone Encounter (Signed)
Pt request referral for dermatology due to burning/rash sensation on back and also small mole on back that need to be check out. Please advise. Pt has new insurance and speaking to Tammy to get it update.

## 2014-03-01 NOTE — Telephone Encounter (Signed)
Referral done

## 2014-03-02 ENCOUNTER — Encounter: Payer: Self-pay | Admitting: Internal Medicine

## 2014-03-07 ENCOUNTER — Encounter: Payer: Self-pay | Admitting: Internal Medicine

## 2014-03-07 ENCOUNTER — Ambulatory Visit (INDEPENDENT_AMBULATORY_CARE_PROVIDER_SITE_OTHER): Payer: 59 | Admitting: Internal Medicine

## 2014-03-07 ENCOUNTER — Other Ambulatory Visit (INDEPENDENT_AMBULATORY_CARE_PROVIDER_SITE_OTHER): Payer: 59

## 2014-03-07 VITALS — BP 130/80 | HR 81 | Temp 98.4°F | Wt 333.0 lb

## 2014-03-07 DIAGNOSIS — R21 Rash and other nonspecific skin eruption: Secondary | ICD-10-CM

## 2014-03-07 DIAGNOSIS — R7302 Impaired glucose tolerance (oral): Secondary | ICD-10-CM

## 2014-03-07 DIAGNOSIS — D751 Secondary polycythemia: Secondary | ICD-10-CM

## 2014-03-07 DIAGNOSIS — I10 Essential (primary) hypertension: Secondary | ICD-10-CM

## 2014-03-07 DIAGNOSIS — R7309 Other abnormal glucose: Secondary | ICD-10-CM

## 2014-03-07 DIAGNOSIS — Z23 Encounter for immunization: Secondary | ICD-10-CM

## 2014-03-07 DIAGNOSIS — L989 Disorder of the skin and subcutaneous tissue, unspecified: Secondary | ICD-10-CM | POA: Insufficient documentation

## 2014-03-07 LAB — CBC WITH DIFFERENTIAL/PLATELET
BASOS ABS: 0.4 10*3/uL — AB (ref 0.0–0.1)
Basophils Relative: 3.6 % — ABNORMAL HIGH (ref 0.0–3.0)
EOS PCT: 2.6 % (ref 0.0–5.0)
Eosinophils Absolute: 0.3 10*3/uL (ref 0.0–0.7)
HCT: 39.5 % (ref 36.0–46.0)
Hemoglobin: 13.3 g/dL (ref 12.0–15.0)
LYMPHS ABS: 1.3 10*3/uL (ref 0.7–4.0)
LYMPHS PCT: 12 % (ref 12.0–46.0)
MCHC: 33.6 g/dL (ref 30.0–36.0)
MCV: 94.6 fl (ref 78.0–100.0)
Monocytes Absolute: 0.6 10*3/uL (ref 0.1–1.0)
Monocytes Relative: 5.7 % (ref 3.0–12.0)
NEUTROS ABS: 8.4 10*3/uL — AB (ref 1.4–7.7)
NEUTROS PCT: 76.1 % (ref 43.0–77.0)
PLATELETS: 207 10*3/uL (ref 150.0–400.0)
RBC: 4.17 Mil/uL (ref 3.87–5.11)
RDW: 13.8 % (ref 11.5–15.5)
WBC: 11.1 10*3/uL — AB (ref 4.0–10.5)

## 2014-03-07 LAB — LIPID PANEL
CHOL/HDL RATIO: 6
Cholesterol: 163 mg/dL (ref 0–200)
HDL: 26.3 mg/dL — AB (ref >39.00)
NONHDL: 136.7
Triglycerides: 286 mg/dL — ABNORMAL HIGH (ref 0.0–149.0)
VLDL: 57.2 mg/dL — AB (ref 0.0–40.0)

## 2014-03-07 LAB — HEMOGLOBIN A1C: Hgb A1c MFr Bld: 6.7 % — ABNORMAL HIGH (ref 4.6–6.5)

## 2014-03-07 LAB — LDL CHOLESTEROL, DIRECT: Direct LDL: 97.5 mg/dL

## 2014-03-07 MED ORDER — KETOCONAZOLE 2 % EX CREA: 1.0000 "application " | TOPICAL_CREAM | Freq: Every day | CUTANEOUS | Status: DC

## 2014-03-07 NOTE — Assessment & Plan Note (Signed)
C/w prob fungal - for ketoconozol cream prn,  to f/u any worsening symptoms or concerns

## 2014-03-07 NOTE — Assessment & Plan Note (Signed)
For f/u a1c today,  to f/u any worsening symptoms or concerns

## 2014-03-07 NOTE — Assessment & Plan Note (Signed)
Also for f/u cbc, likely improved now on qhs home o2 2l

## 2014-03-07 NOTE — Addendum Note (Signed)
Addended by: Sharon Seller B on: 03/07/2014 11:25 AM   Modules accepted: Orders

## 2014-03-07 NOTE — Progress Notes (Signed)
Subjective:    Patient ID: Autumn Massey, female    DOB: [birth year], 48 y.o.   MRN: [id number]  HPI  Here to f/u; overall doing ok,  Pt denies chest pain, increased sob or doe, wheezing, orthopnea, PND, increased LE swelling, palpitations, dizziness or syncope.  Pt denies polydipsia, polyuria, or low sugar symptoms such as weakness or confusion improved with po intake.  Pt denies new neurological symptoms such as new headache, or facial or extremity weakness or numbness.   Pt states overall good compliance with meds, has been trying to follow lower cholesterol diet, with wt overall down intentionally now over 20 lbs with better diet, but little exercise however. On gabapentin now bid for tremor, and also aldactone per endo, has helped hair loss but now on lesser dose due to higher K, also freq tsh lately as well.  Had elev a1c 6 mo ago, but now lost close to 40 lbs since April with food diary and fitbit.  Still using Home o2 oxygen 2L, last Hgb improved.  Due for flu shot. Concerned about several moles to her back., as well as a nonpainful rash unusual to midline lower back Past Medical History  Diagnosis Date  . ANXIETY DEPRESSION 01/29/2009  . DEPRESSION 11/19/2007  . FREQUENCY, URINARY 10/17/2009  . HYPERLIPIDEMIA 11/19/2007  . HYPERTENSION 11/19/2007  . HYPOTHYROIDISM 11/19/2007  . PNEUMONIA 08/06/2010  . POLYCYTHEMIA 01/29/2009  . Preventative health care 04/06/2011  . SLEEP RELATED HYPOVENTILATION/HYPOXEMIA CCE 02/08/2009  . Morbid obesity   . Venous insufficiency 07/19/2012  . Sleep apnea     mild sleep apnea, on o2 at 2l at nighttime   Past Surgical History  Procedure Laterality Date  . Tonsillectomy    . Ulnar tunnel release Left 02/22/2013    Procedure: LEFT ULNAR NERVE DECOMPRESSION ;  Surgeon: Tennis Must, MD;  Location: [facility];  Service: Orthopedics;  Laterality: Left;    reports that she has been smoking.  She does not have any smokeless tobacco history on file. She  reports that she drinks alcohol. She reports that she does not use illicit drugs. family history includes Alcohol abuse in her other; Arthritis in her other; Cancer in her maternal grandfather and mother; Hypertension in her other. No Known Allergies Current Outpatient Prescriptions on File Prior to Visit  Medication Sig Dispense Refill  . aspirin EC 81 MG tablet Take 81 mg by mouth daily.      . carbamazepine (TEGRETOL) 100 MG chewable tablet Chew 1 tablet (100 mg total) by mouth 2 (two) times daily.  60 tablet  11  . clonazePAM (KLONOPIN) 0.5 MG disintegrating tablet DISSOLVE ONE TABLET IN MOUTH TWICE DAILY AS NEEDED FOR ANXIETY  60 tablet  2  . FLUoxetine (PROZAC) 20 MG capsule Take 1 capsule (20 mg total) by mouth daily.  90 capsule  3  . furosemide (LASIX) 40 MG tablet Take 1 tablet (40 mg total) by mouth daily.  90 tablet  3  . hydrochlorothiazide (HYDRODIURIL) 25 MG tablet Take 1 tablet (25 mg total) by mouth daily.  90 tablet  3  . irbesartan (AVAPRO) 300 MG tablet Take 1 tablet (300 mg total) by mouth at bedtime.  90 tablet  3  . labetalol (NORMODYNE) 200 MG tablet Take 1 tablet (200 mg total) by mouth 2 (two) times daily.  180 tablet  3  . levothyroxine (SYNTHROID, LEVOTHROID) 112 MCG tablet Take 2 tablets (224 mcg total) by mouth daily before breakfast.  120  tablet  2  . NON FORMULARY Oxygen 2L use as bedtime      . phentermine 37.5 MG capsule Take 1 capsule (37.5 mg total) by mouth every morning.  30 capsule  2  . simvastatin (ZOCOR) 40 MG tablet TAKE 1 TABLET BY MOUTH AT BEDTIME  90 tablet  2  . spironolactone (ALDACTONE) 25 MG tablet Take 1 tablet (25 mg total) by mouth 2 (two) times daily.  180 tablet  2  . spironolactone (ALDACTONE) 50 MG tablet Take 0.5 tablets (25 mg total) by mouth 2 (two) times daily.  60 tablet  3   No current facility-administered medications on file prior to visit.    Review of Systems  Constitutional: Negative for unusual diaphoresis or other sweats    HENT: Negative for ringing in ear Eyes: Negative for double vision or worsening visual disturbance.  Respiratory: Negative for choking and stridor.   Gastrointestinal: Negative for vomiting or other signifcant bowel change Genitourinary: Negative for hematuria or decreased urine volume.  Musculoskeletal: Negative for other MSK pain or swelling Skin: Negative for color change and worsening wound.  Neurological: Negative for tremors and numbness other than noted  Psychiatric/Behavioral: Negative for decreased concentration or agitation other than above       Objective:   Physical Exam BP 130/80  Pulse 81  Temp(Src) 98.4 F (36.9 C) (Oral)  Wt 333 lb (151.048 kg)  SpO2 92% VS noted,  Constitutional: Pt appears well-developed, well-nourished. Autumn Massey HENT: Head: NCAT.  Right Ear: External ear normal.  Left Ear: External ear normal.  Eyes: . Pupils are equal, round, and reactive to light. Conjunctivae and EOM are normal Neck: Normal range of motion. Neck supple.  Cardiovascular: Normal rate and regular rhythm.   Pulmonary/Chest: Effort normal and breath sounds normal.  Neurological: Pt is alert. Not confused , motor grossly intact Skin: Skin is warm. Has midline dark prob fungal type rash to midline lower thoracic/lumbar area, also right post shoulder two toned lesion, non raised, non irregular (but mother with hx of melanoma) Psychiatric: Pt behavior is normal. No agitation.     Assessment & Plan:

## 2014-03-07 NOTE — Progress Notes (Signed)
Pre visit review using our clinic review tool, if applicable. No additional management support is needed unless otherwise documented below in the visit note. 

## 2014-03-07 NOTE — Patient Instructions (Addendum)
You had the flu shot today  Please take all new medication as prescribed  - the cream  Please continue all other medications as before, and refills have been done if requested.  Please have the pharmacy call with any other refills you may need.  Please continue your efforts at being more active, low cholesterol diet, and weight control.  Please keep your appointments with your specialists as you may have planned  Please go to the LAB in the Basement (turn left off the elevator) for the tests to be done today  You will be contacted by phone if any changes need to be made immediately.  Otherwise, you will receive a letter about your results with an explanation, but please check with MyChart first.  Please return in 6 months, or sooner if needed

## 2014-03-07 NOTE — Assessment & Plan Note (Signed)
stable overall by history and exam, recent data reviewed with pt, and pt to continue medical treatment as before,  to f/u any worsening symptoms or concerns BP Readings from Last 3 Encounters:  03/07/14 130/80  11/29/13 122/68  10/18/13 138/80

## 2014-03-07 NOTE — Assessment & Plan Note (Signed)
Ok for derm referral,  to f/u any worsening symptoms or concerns 

## 2014-03-08 ENCOUNTER — Other Ambulatory Visit (INDEPENDENT_AMBULATORY_CARE_PROVIDER_SITE_OTHER): Payer: 59

## 2014-03-08 ENCOUNTER — Ambulatory Visit: Payer: 59 | Admitting: *Deleted

## 2014-03-08 VITALS — BP 102/60 | Wt 328.0 lb

## 2014-03-08 DIAGNOSIS — E039 Hypothyroidism, unspecified: Secondary | ICD-10-CM

## 2014-03-08 DIAGNOSIS — I10 Essential (primary) hypertension: Secondary | ICD-10-CM

## 2014-03-08 DIAGNOSIS — E282 Polycystic ovarian syndrome: Secondary | ICD-10-CM

## 2014-03-08 LAB — TSH: TSH: 0.33 u[IU]/mL — AB (ref 0.35–4.50)

## 2014-03-08 LAB — POTASSIUM: POTASSIUM: 5.1 meq/L (ref 3.5–5.1)

## 2014-03-08 LAB — T4, FREE: Free T4: 1.05 ng/dL (ref 0.60–1.60)

## 2014-03-09 DIAGNOSIS — Z0289 Encounter for other administrative examinations: Secondary | ICD-10-CM

## 2014-03-10 ENCOUNTER — Encounter: Payer: Self-pay | Admitting: Internal Medicine

## 2014-04-18 ENCOUNTER — Ambulatory Visit: Payer: 59 | Admitting: Internal Medicine

## 2014-05-24 ENCOUNTER — Encounter: Payer: Self-pay | Admitting: Internal Medicine

## 2014-05-24 ENCOUNTER — Ambulatory Visit (INDEPENDENT_AMBULATORY_CARE_PROVIDER_SITE_OTHER): Payer: 59 | Admitting: Internal Medicine

## 2014-05-24 ENCOUNTER — Other Ambulatory Visit: Payer: Self-pay | Admitting: Internal Medicine

## 2014-05-24 VITALS — BP 112/52 | HR 73 | Temp 97.9°F | Ht 62.0 in | Wt 326.0 lb

## 2014-05-24 DIAGNOSIS — E039 Hypothyroidism, unspecified: Secondary | ICD-10-CM

## 2014-05-24 NOTE — Patient Instructions (Signed)
Please stop at [facility] lab downstairs. KEEP UP THE GREAT WORK!

## 2014-05-24 NOTE — Progress Notes (Signed)
Patient ID: Autumn Massey, female   DOB: 02-Oct-1965, 48 y.o.   MRN: [id number]   HPI  Autumn Massey is a 48 y.o.-year-old female, returning for f/u for hypothyroidism and PCOS. Last visit 7 mo ago.  She lost ~40 lbs since last visit: watches diet and started to exercise! Vitals - 1 value per visit 05/24/2014 03/08/2014 03/07/2014 11/29/2013 10/18/2013  Weight (lb) 326 328 333 340 361.8   PCOS: Reviewed hx:  - weight gain: 320-330 lbs >> then increase to 360 in 7 months.  - h/o losing weight 323 lbs >> 299 in summer 2013  - was on Phentermine, which worked initially, then stopped working  - is considering gastric bypass but only as a last resort - + hirsutism >> started to see facial hair in her 30s - + hair loss >> female pattern, and bald spot in vertex - no acne - + fatigue, would sleep all day - had a Mirena IUD placed 04/2013 for heavy menstrual cycles  - had a steroid injection x 1 - last year (knee), and has had 3 Prednisone tapers x 3 in last 3 years.  At last visit, we checked labs: Component     Latest Ref Rng 10/18/2013  Testosterone     10 - 70 ng/dL 78 (H)  Sex Hormone Binding     18 - 114 nmol/L 28  Testosterone Free     0.6 - 6.8 pg/mL 15.6 (H)  Testosterone-% Free     0.4 - 2.4 % 2.0  Androstenedione      93  DHEA-SO4     35 - 430 ug/dL 35   Testosterone high >> indicating PCOS.  We started Spironolactone. Hair is thickening on Spironolactone. Last potassium: Lab Results  Component Value Date   K 5.1 03/08/2014   She checks sugars 1x a day : fastin 90s, postprandials: 130-140.   Hypothyroidism: Pt. has been dx with hypothyroidism in 2005; is on Levothyroxine 112 mcg x2 a day, taken: - fasting - in am - with water or tea - with all the other meds - separated by >60 min from b'fast  - no calcium, iron, PPIs, multivitamins (was on MVI)  I reviewed pt's thyroid tests: Lab Results  Component Value Date   TSH 0.33* 03/08/2014   TSH 0.33* 01/27/2014   TSH  1.91 01/10/2014   TSH 4.88* 11/29/2013   TSH 10.04* 10/18/2013   TSH 9.58* 08/11/2013   TSH 4.15 03/15/2012   TSH 3.10 04/04/2011   TSH 6.16* 05/02/2010   TSH 11.07* 02/18/2010   FREET4 1.05 03/08/2014   FREET4 1.38 01/27/2014   FREET4 1.16 01/10/2014   FREET4 0.86 11/29/2013   FREET4 0.75 10/18/2013    Pt denies feeling nodules in neck, hoarseness, dysphagia/odynophagia, SOB with lying down.  She just increased the neurontin to 3x a day (for tremors) but had to decrease to 2x a day today b/c somnolence. She is starting Tegretol to help with the tremors.  She has been tested for Cushing's sd. In 02/2012 by a Dexamethasone suppression test >> cortisol returned at 1.4 (normal). She also had normal urine CA and MN (testing for 2nd HTN) >> no pheochromocytoma.  ROS: Constitutional: see HPI Eyes: no blurry vision, no xerophthalmia ENT: no sore throat, no nodules palpated in throat, no dysphagia/odynophagia, no hoarseness Cardiovascular: no CP/SOB/palpitations/leg swelling Respiratory: no cough/SOB/wheezing Gastrointestinal: no N/V/D/C Musculoskeletal: no muscle/joint aches Skin: + rashes (legs) Neurological: + tremors/no numbness/tingling/dizziness  PE: BP 112/52 mmHg  Pulse 73  Temp(Src) 97.9 F (36.6 C) (Oral)  Ht 5\' 2"  (1.575 m)  Wt 326 lb (147.873 kg)  BMI 59.61 kg/m2  SpO2 95% Wt Readings from Last 3 Encounters:  05/24/14 326 lb (147.873 kg)  03/08/14 328 lb (148.78 kg)  03/07/14 333 lb (151.048 kg)   Constitutional: obesity class 3 (see below), in NAD, + full subclavic. fat pads Eyes: PERRLA, EOMI, no exophthalmos ENT: moist mucous membranes, no thyromegaly, no cervical lymphadenopathy Cardiovascular: RRR, No MRG Respiratory: CTA B Gastrointestinal: abdomen soft, NT, ND, BS+ Musculoskeletal: no deformities, strength intact in all 4 Skin: moist, warm, + rash: stasis dermatitis on legs, + Hirsutism (shaves) on chin; + skin tags Neurological: Mild tremor with  outstretched hands, DTR normal in all 4  BMI Classification:  < 18.5 underweight   18.5-24.9 normal weight   25.0-29.9 overweight   30.0-34.9 class I obesity   35.0-39.9 class II obesity   ? 40.0 class III obesity   ASSESSMENT: 1. Hypothyroidism  2. Weight gain  Component     Latest Ref Rng 10/25/2013  Cortisol (Ur), Free     4.0 - 50.0 mcg/24 h 26.4  Results received     0.63 - 2.50 g/24 h 1.83  Creatinine, Urine      59.9  Creatinine, 24H Ur     700 - 1800 mg/day 1798   3. PCOS  PLAN:  1. Patient with long-standing hypothyroidism, on levothyroxine therapy. She still has fatigue, which she attributes to her Neurontin. She complained of weight gain at last visit, however this improved dramatically due to her increase in exercise and also her diet. - We again  discussed about correct intake of levothyroxine, fasting, with water, separated by at least 30 minutes from breakfast, and separated by more than 4 hours from calcium, iron, multivitamins, acid reflux medications (PPIs). - She does not appear to have a goiter, thyroid nodules, or neck compression symptoms - we'll check thyroid tests today: TSH, free T4 - If these are abnormal, she will need to return in 6-8 weeks for repeat labs - If these are normal, I will see her back in 6 months  2. Weight gain - MUCH improved >> lost 40 lbs since last visit, after our discussion. He uses a fit bit (her goal is 5000 steps in a day) and makes smarted food choices, despite eating out once a day. Counts calories and if she meets her dail;y count >> stops eating - we r/o Cushing's sd.by a normal 24h UFC;  02/2012 DST was normal  3. Hirsutism and female-pattern baldness - 2/2 PCOS - cannot check LH and FSH since on hormonal IUD - we started Spironolactone >> her body hair has decreased, however, she still has terminal hair on her chin. She does not feel that this has decreased by a lot. She feels that her scalp hair quality has  improved, and she feels it is growing faster now. - Since she had a high potassium reading (5.4) low we tried to increase the spironolactone to 50 mg twice a day, we decreased the dose to 25 mg twice a day and will maintain it at this dose. - I advised her that weight loss will help with this, also  Orders Only on 05/24/2014  Component Date Value Ref Range Status  . TSH 05/24/2014 1.692  0.350 - 4.500 uIU/mL Final  . Free T4 05/24/2014 1.21  0.80 - 1.80 ng/dL Final   Normal TFTs >> continue 224 mcg LT4 daily.

## 2014-05-25 LAB — T4, FREE: FREE T4: 1.21 ng/dL (ref 0.80–1.80)

## 2014-05-25 LAB — TSH: TSH: 1.692 u[IU]/mL (ref 0.350–4.500)

## 2014-06-14 ENCOUNTER — Other Ambulatory Visit: Payer: Self-pay | Admitting: Internal Medicine

## 2014-08-04 ENCOUNTER — Other Ambulatory Visit: Payer: Self-pay | Admitting: Internal Medicine

## 2014-08-08 ENCOUNTER — Other Ambulatory Visit: Payer: Self-pay | Admitting: Internal Medicine

## 2014-08-14 ENCOUNTER — Other Ambulatory Visit: Payer: Self-pay | Admitting: Internal Medicine

## 2014-08-30 ENCOUNTER — Encounter: Payer: Self-pay | Admitting: Internal Medicine

## 2014-08-30 ENCOUNTER — Ambulatory Visit (INDEPENDENT_AMBULATORY_CARE_PROVIDER_SITE_OTHER): Payer: 59 | Admitting: Internal Medicine

## 2014-08-30 VITALS — BP 118/84 | HR 75 | Temp 99.0°F | Ht 62.0 in | Wt 334.0 lb

## 2014-08-30 DIAGNOSIS — R05 Cough: Secondary | ICD-10-CM

## 2014-08-30 DIAGNOSIS — G259 Extrapyramidal and movement disorder, unspecified: Secondary | ICD-10-CM

## 2014-08-30 DIAGNOSIS — I10 Essential (primary) hypertension: Secondary | ICD-10-CM

## 2014-08-30 DIAGNOSIS — R059 Cough, unspecified: Secondary | ICD-10-CM | POA: Insufficient documentation

## 2014-08-30 DIAGNOSIS — E039 Hypothyroidism, unspecified: Secondary | ICD-10-CM

## 2014-08-30 MED ORDER — AZITHROMYCIN 250 MG PO TABS: 250.0000 mg | ORAL_TABLET | Freq: Once | ORAL | Status: DC

## 2014-08-30 NOTE — Assessment & Plan Note (Signed)
stable overall by history and exam, recent data reviewed with pt, and pt to continue medical treatment as before,  to f/u any worsening symptoms or concerns BP Readings from Last 3 Encounters:  08/30/14 118/84  05/24/14 112/52  03/08/14 102/60

## 2014-08-30 NOTE — Assessment & Plan Note (Signed)
[facility] for neurology referral locally per pt rquest, will refer to neurology

## 2014-08-30 NOTE — Patient Instructions (Addendum)
Please take all new medication as prescribed - the antibiotic  Please continue all other medications as before, and refills have been done if requested.  Please have the pharmacy call with any other refills you may need.  Please continue your efforts at being more active, low cholesterol diet, and weight control.  You are otherwise up to date with prevention measures today.  Please keep your appointments with your specialists as you may have planned  You will be contacted regarding the referral for: Dr Tat, neurology

## 2014-08-30 NOTE — Addendum Note (Signed)
Addended by: Valerie Salts on: 08/30/2014 04:40 PM   Modules accepted: Orders

## 2014-08-30 NOTE — Assessment & Plan Note (Signed)
Lab Results  Component Value Date   TSH 1.692 05/24/2014   stable overall by history and exam, recent data reviewed with pt, and pt to continue medical treatment as before,  to f/u any worsening symptoms or concerns

## 2014-08-30 NOTE — Progress Notes (Signed)
Subjective:    Patient ID: Autumn Massey, female    DOB: August 17, 1965, 49 y.o.   MRN: [id number]  HPI  .  Here to f/u , Plans to f/u with GYN later this yr for f/u IUD and pap. Now on primidone 50 qhs for tremor per neurolgy [facility]. S/p left ulnar surgury, but since then with spasms and tremors mostly 5th finger left hand, but also sometimes 4th finger as well. Is s/p PT, then saw Dr Lewit/neurology with neg NCS by pt report, then to [facility], started gabapentin but ould not tolerate higher dose due to sleepiness . Uncle also with tremor x 5 yrs, Gpa with parkinsons as well.  Primidone not helping recently, was referred to a different specialist, but prefers to see local nuerology if possible, transporation can be difficult to [place].  Tremor caused her to not be able to type well, was let go from her position.  Has LTD.  Also applying for SSI, needs form filled out - physical capacities exam.  Denies worsening depressive symptoms, suicidal ideation, or panic; has ongoing anxiety. Also now legally deaf ear.  Also - Here with acute onset mild to mod 2-3 days ST, HA, general weakness and malaise, with prod cough greenish sputum, but Pt denies chest pain, increased sob or doe, wheezing, orthopnea, PND, increased LE swelling, palpitations, dizziness or syncope.  Denies hyper or hypo thyroid symptoms such as voice, skin or hair change. Past Medical History  Diagnosis Date  . ANXIETY DEPRESSION 01/29/2009  . DEPRESSION 11/19/2007  . FREQUENCY, URINARY 10/17/2009  . HYPERLIPIDEMIA 11/19/2007  . HYPERTENSION 11/19/2007  . HYPOTHYROIDISM 11/19/2007  . PNEUMONIA 08/06/2010  . POLYCYTHEMIA 01/29/2009  . Preventative health care 04/06/2011  . SLEEP RELATED HYPOVENTILATION/HYPOXEMIA CCE 02/08/2009  . Morbid obesity   . Venous insufficiency 07/19/2012  . Sleep apnea     mild sleep apnea, on o2 at 2l at nighttime   Past Surgical History  Procedure Laterality Date  . Tonsillectomy    . Ulnar tunnel release Left 02/22/2013   Procedure: LEFT ULNAR NERVE DECOMPRESSION ;  Surgeon: Tennis Must, MD;  Location: [place] [facility];  Service: Orthopedics;  Laterality: Left;    reports that she has been smoking.  She does not have any smokeless tobacco history on file. She reports that she drinks alcohol. She reports that she does not use illicit drugs. family history includes Alcohol abuse in her other; Arthritis in her other; Cancer in her maternal grandfather and mother; Hypertension in her other. No Known Allergies Current Outpatient Prescriptions on File Prior to Visit  Medication Sig Dispense Refill  . aspirin EC 81 MG tablet Take 81 mg by mouth daily.    . clonazePAM (KLONOPIN) 0.5 MG disintegrating tablet DISSOLVE ONE TABLET IN MOUTH TWICE DAILY AS NEEDED FOR ANXIETY 60 tablet 2  . FLUoxetine (PROZAC) 20 MG capsule TAKE 1 CAPSULE BY MOUTH ONCE DAILY 90 capsule 3  . furosemide (LASIX) 40 MG tablet TAKE ONE TABLET BY MOUTH ONCE DAILY 90 tablet 0  . irbesartan (AVAPRO) 300 MG tablet TAKE ONE TABLET BY MOUTH AT BEDTIME 90 tablet 0  . ketoconazole (NIZORAL) 2 % cream Apply 1 application topically daily. 15 g 1  . labetalol (NORMODYNE) 200 MG tablet TAKE 1 TABLET BY MOUTH TWICE DAILY 60 tablet 2  . levothyroxine (SYNTHROID, LEVOTHROID) 112 MCG tablet TAKE 2 TABLETS (224 MCG TOTAL) BY MOUTH DAILY BEFORE BREAKFAST. 120 tablet 2  . NON FORMULARY Oxygen 2L use as bedtime    .  simvastatin (ZOCOR) 40 MG tablet TAKE 1 TABLET BY MOUTH AT BEDTIME 90 tablet 2  . spironolactone (ALDACTONE) 25 MG tablet Take 1 tablet (25 mg total) by mouth 2 (two) times daily. 180 tablet 2  . hydrochlorothiazide (HYDRODIURIL) 25 MG tablet Take 1 tablet (25 mg total) by mouth daily. 90 tablet 3   No current facility-administered medications on file prior to visit.     Review of Systems  Constitutional: Negative for unusual diaphoresis or other sweats  HENT: Negative for ringing in ear Eyes: Negative for double vision or worsening visual  disturbance.  Respiratory: Negative for choking and stridor.   Gastrointestinal: Negative for vomiting or other signifcant bowel change Genitourinary: Negative for hematuria or decreased urine volume.  Musculoskeletal: Negative for other MSK pain or swelling Skin: Negative for color change and worsening wound.  Neurological: Negative for tremors and numbness other than noted  Psychiatric/Behavioral: Negative for decreased concentration or agitation other than above       Objective:   Physical Exam BP 118/84 mmHg  Pulse 75  Temp(Src) 99 F (37.2 C) (Oral)  Ht 5\' 2"  (1.575 m)  Wt 334 lb 0.6 oz (151.52 kg)  BMI 61.08 kg/m2  SpO2 96% VS noted,  Constitutional: Pt appears well-developed, well-nourished./morbid obese  HENT: Head: NCAT.  Right Ear: External ear normal.  Left Ear: External ear normal.  Bilat tm's with mild erythema.  Max sinus areas non tender.  Pharynx with mild erythema, no exudate Eyes: . Pupils are equal, round, and reactive to light. Conjunctivae and EOM are normal Neck: Normal range of motion. Neck supple.  Cardiovascular: Normal rate and regular rhythm.   Pulmonary/Chest: Effort normal and breath sounds without rales or wheezing.  Abd:  Soft, NT, ND, + BS Neurological: Pt is alert. Not confused , motor grossly intact, no overt tremor at this time Skin: Skin is warm. No rash Psychiatric: Pt behavior is normal. No agitation. ,mild nervous     Assessment & Plan:

## 2014-08-30 NOTE — Assessment & Plan Note (Signed)
Ok for zpac, declines cxr today,  to f/u any worsening symptoms or concerns

## 2014-08-30 NOTE — Progress Notes (Signed)
Pre visit review using our clinic review tool, if applicable. No additional management support is needed unless otherwise documented below in the visit note. 

## 2014-08-31 ENCOUNTER — Telehealth: Payer: Self-pay | Admitting: Internal Medicine

## 2014-08-31 NOTE — Telephone Encounter (Signed)
emmi emailed °

## 2014-09-01 ENCOUNTER — Telehealth: Payer: Self-pay | Admitting: Internal Medicine

## 2014-09-01 NOTE — Telephone Encounter (Signed)
emmi emailed °

## 2014-09-04 ENCOUNTER — Other Ambulatory Visit: Payer: Self-pay | Admitting: Internal Medicine

## 2014-10-08 ENCOUNTER — Other Ambulatory Visit: Payer: Self-pay | Admitting: Internal Medicine

## 2014-10-13 ENCOUNTER — Encounter: Payer: Self-pay | Admitting: Neurology

## 2014-10-13 ENCOUNTER — Ambulatory Visit (INDEPENDENT_AMBULATORY_CARE_PROVIDER_SITE_OTHER): Payer: 59 | Admitting: Neurology

## 2014-10-13 VITALS — BP 102/68 | HR 75 | Resp 20 | Ht 62.0 in | Wt 330.0 lb

## 2014-10-13 DIAGNOSIS — G5620 Lesion of ulnar nerve, unspecified upper limb: Secondary | ICD-10-CM

## 2014-10-13 DIAGNOSIS — G250 Essential tremor: Secondary | ICD-10-CM

## 2014-10-13 DIAGNOSIS — G5601 Carpal tunnel syndrome, right upper limb: Secondary | ICD-10-CM

## 2014-10-13 NOTE — Patient Instructions (Signed)
1.  Start taking 1 and 1/2 tablets of primidone at bedtime.  Call prior to refill and let us know if there is improvement 2.  Will get NCV-EMG or right upper extremity. 3.  Follow up after EMG.

## 2014-10-13 NOTE — Progress Notes (Signed)
NEUROLOGY CONSULTATION NOTE  [facility] MRN: [id number] DOB: [birth year]  Referring provider: Dr. Jenny Reichmann Primary care provider: Dr. Jenny Reichmann  Reason for consult:  Tremor, spasm, numbness in hands  HISTORY OF PRESENT ILLNESS: Autumn Massey is a 49 year old right-handed woman with hypothyroidism, hyperlipidemia, impaired glucose tolerance, hypertension, hypoventilation, cerebrovascular disease and former smoker who presents for tremor, numbness and spasms.  Records, labs and MRI of cervical spine and brain reviewed.  She underwent left ulnar decompression surgery in August 2014.  When the cast was removed the following month, she noted twitching of her 5th digit.[name]  An MRI of the cervical spine was performed in December 2014, which revealed no structural etiology for radiculopathy.  She was evaluated by Dr. Catalina Gravel, a neurologist in [place], who performed a [place] in January 2015.  It was essentially a normal exam.  She continues to have numbness, paresthesias and weakness of the 4th and 5th digits of the left hand.  She later developed tremor in both hands, more noticeable in the right hand since she is right-hand dominant.  It occurs when she is writing or holding utensils.  She denies gait difficulty, rigidity, lack of sense of smell, or history consistent with REM sleep behavior.  She is concerned about Parkinson's disease because her maternal grandfather and uncle had it.  Another uncle has tremor.  She also notes numbness and tingling involving the entire right hand.  It is noticeable when she holds a phone or when she is driving any significant distance.  She is able to shake it out.  She does not wake up with it.  She tried Lyrica, which she could not tolerate.  She was then seen by neurology at [facility] for the tremor.  She was started on gabapentin.  She could not tolerate it.  She was then switched to primidone and currently takes 50mg  at bedtime.  She has not really noticed any improvement  in tremor..  Because of headache, Dr. Catalina Gravel also ordered an MRI of the brain was performed in February 2015, which showed remote punctate infarct.  She is taking ASA 81mg  daily  PAST MEDICAL HISTORY: Past Medical History  Diagnosis Date  . ANXIETY DEPRESSION 01/29/2009  . DEPRESSION 11/19/2007  . FREQUENCY, URINARY 10/17/2009  . HYPERLIPIDEMIA 11/19/2007  . HYPERTENSION 11/19/2007  . HYPOTHYROIDISM 11/19/2007  . PNEUMONIA 08/06/2010  . POLYCYTHEMIA 01/29/2009  . Preventative health care 04/06/2011  . SLEEP RELATED HYPOVENTILATION/HYPOXEMIA CCE 02/08/2009  . Morbid obesity   . Venous insufficiency 07/19/2012  . Sleep apnea     mild sleep apnea, on o2 at 2l at nighttime    PAST SURGICAL HISTORY: Past Surgical History  Procedure Laterality Date  . Tonsillectomy    . Ulnar tunnel release Left 02/22/2013    Procedure: LEFT ULNAR NERVE DECOMPRESSION ;  Surgeon: Tennis Must, MD;  Location: [facility];  Service: Orthopedics;  Laterality: Left;    MEDICATIONS: Current Outpatient Prescriptions on File Prior to Visit  Medication Sig Dispense Refill  . aspirin EC 81 MG tablet Take 81 mg by mouth daily.    . clonazePAM (KLONOPIN) 0.5 MG disintegrating tablet DISSOLVE ONE TABLET IN MOUTH TWICE DAILY AS NEEDED FOR ANXIETY 60 tablet 2  . FLUoxetine (PROZAC) 20 MG capsule TAKE 1 CAPSULE BY MOUTH ONCE DAILY 90 capsule 3  . furosemide (LASIX) 40 MG tablet TAKE ONE TABLET BY MOUTH ONCE DAILY 90 tablet 0  . hydrochlorothiazide (HYDRODIURIL) 25 MG tablet TAKE 1 TABLET BY MOUTH  ONCE DAILY 90 tablet 1  . irbesartan (AVAPRO) 300 MG tablet TAKE ONE TABLET BY MOUTH AT BEDTIME 90 tablet 0  . labetalol (NORMODYNE) 200 MG tablet TAKE 1 TABLET BY MOUTH TWICE DAILY 60 tablet 2  . levothyroxine (SYNTHROID, LEVOTHROID) 112 MCG tablet TAKE 2 TABLETS (224 MCG TOTAL) BY MOUTH DAILY BEFORE BREAKFAST. 120 tablet 2  . Multiple Vitamin (MULTIVITAMIN) tablet Take 1 tablet by mouth daily.    . NON FORMULARY Oxygen  2L use as bedtime    . primidone (MYSOLINE) 50 MG tablet Take 50 mg by mouth 4 (four) times daily.    . simvastatin (ZOCOR) 40 MG tablet TAKE 1 TABLET BY MOUTH AT BEDTIME 90 tablet 2  . spironolactone (ALDACTONE) 25 MG tablet TAKE 1 TABLET BY MOUTH TWICE A DAY 180 tablet 1  . ketoconazole (NIZORAL) 2 % cream Apply 1 application topically daily. (Patient not taking: Reported on 10/13/2014) 15 g 1   No current facility-administered medications on file prior to visit.    ALLERGIES: No Known Allergies  FAMILY HISTORY: Family History  Problem Relation Age of Onset  . Cancer Mother     melanoma   . Cancer Maternal Grandfather     prostate cancer  . Alcohol abuse Other   . Arthritis Other   . Hypertension Other     SOCIAL HISTORY: History   Social History  . Marital Status: Divorced    Spouse Name: N/A  . Number of Children: 0  . Years of Education: 12   Occupational History  . customer serv. manager [employer]    Social History Main Topics  . Smoking status: Current Every Day Smoker -- 0.25 packs/day  . Smokeless tobacco: Never Used     Comment: started at age 45, less than 1 ppd.  . Alcohol Use: 0.0 oz/week    0 Standard drinks or equivalent per week     Comment: rare  . Drug Use: No  . Sexual Activity: Yes    Birth Control/ Protection: IUD   Other Topics Concern  . Not on file   Social History Narrative    REVIEW OF SYSTEMS: Constitutional: No fevers, chills, or sweats, no generalized fatigue, change in appetite Eyes: No visual changes, double vision, eye pain Ear, nose and throat: No hearing loss, ear pain, nasal congestion, sore throat Cardiovascular: No chest pain, palpitations Respiratory:  No shortness of breath at rest or with exertion, wheezes GastrointestinaI: No nausea, vomiting, diarrhea, abdominal pain, fecal incontinence Genitourinary:  No dysuria, urinary retention or frequency Musculoskeletal:  No neck pain, back pain Integumentary:  No rash, pruritus, skin lesions Neurological: as above Psychiatric: No depression, insomnia, anxiety Endocrine: No palpitations, fatigue, diaphoresis, mood swings, change in appetite, change in weight, increased thirst Hematologic/Lymphatic:  No anemia, purpura, petechiae. Allergic/Immunologic: no itchy/runny eyes, nasal congestion, recent allergic reactions, rashes  PHYSICAL EXAM: Filed Vitals:   10/13/14 0755  BP: 102/68  Pulse: 75  Resp: 20   General: No acute distress Head:  Normocephalic/atraumatic Eyes:  fundi unremarkable, without vessel changes, exudates, hemorrhages or papilledema. Neck: supple, no paraspinal tenderness, full range of motion Back: No paraspinal tenderness Heart: regular rate and rhythm Lungs: Clear to auscultation bilaterally. Vascular: No carotid bruits. Neurological Exam: Mental status: alert and oriented to person, place, and time, recent and remote memory intact, fund of knowledge intact, attention and concentration intact, speech fluent and not dysarthric, language intact. Cranial nerves: CN I: not tested CN II: pupils equal, round and reactive to  light, visual fields intact, fundi unremarkable, without vessel changes, exudates, hemorrhages or papilledema. CN III, IV, VI:  full range of motion, no nystagmus, no ptosis CN V: facial sensation intact CN VII: upper and lower face symmetric CN VIII: hearing intact CN IX, X: gag intact, uvula midline CN XI: sternocleidomastoid and trapezius muscles intact CN XII: tongue midline Bulk & Tone: normal, no fasciculations. Motor:  4+/5 5th digit of left hand.  Otherwise, 5/5.   Sensation:  Pinprick and vibration intact.  Dysesthesias involving the 5th digit and ulnar aspect of left hand. Deep Tendon Reflexes:  2+ throughout, toes downgoing. Finger to nose testing:  Fine bilateral postural and kinetic tremor. Heel to shin:  No dysmetria Gait:  Normal station and stride.  Able to turn and walk in tandem.  Romberg negative. Positive Tinel's sign of right wrist and elbow  IMPRESSION: Status post left ulnar decompression Probable right carpal tunnel syndrome, likely cause of right hand numbness Probable right ulnar neuropathy, likely not primary cause of right hand numbness Essential tremor Morbid obesity  PLAN: 1.  Increase primidone to 75mg  at bedtime 2.  NCV-EMG of right upper extremity 3.  Weight loss 4.  Follow up after exam.  Thank you for allowing me to take part in the care of this patient.  Metta Clines, DO  CC:  [name], MD

## 2014-10-30 ENCOUNTER — Ambulatory Visit (INDEPENDENT_AMBULATORY_CARE_PROVIDER_SITE_OTHER): Payer: 59 | Admitting: Neurology

## 2014-10-30 DIAGNOSIS — G5620 Lesion of ulnar nerve, unspecified upper limb: Secondary | ICD-10-CM

## 2014-10-30 DIAGNOSIS — G5601 Carpal tunnel syndrome, right upper limb: Secondary | ICD-10-CM | POA: Diagnosis not present

## 2014-10-30 NOTE — Procedures (Signed)
[facility]  [address], [place]  [place], [place] [address] Tel: [phone] Fax:  [phone] Test Date:  10/30/2014  Patient: Autumn Massey DOB: 08-04-1965 Physician: Narda Amber, DO  Sex: Female Height: 5\' 2"  Ref Phys: Metta Clines  ID#: [id number] Temp: 35.0C Technician: Laureen Ochs R. NCS T.   Patient Complaints: Patient is a 49 year old female here for evaluation of her right hand numbness.  NCV & EMG Findings: Extensive electrodiagnostic testing of the right upper extremity shows:  1. Right median, ulnar, radial, and dorsal ulnar cutaneous sensory responses are within normal limits.  2. Right ulnar motor nerve shows slowed conduction velocity in the forearm and across the elbow B Elbow-Wrist, 41 m/s, A Elbow-B Elbow, 40 m/s), with preserved latency and amplitude. Right median motor responses within normal limits. 3. Chronic motor axon loss changes are seen affecting the first dorsal interosseous, abductor digiti minimi, and flexor carpi ulnaris, and digitorum profundus 4 & 5 muscles, without accompanied active denervation.  Impression: 1. The electrophysiologic findings are most consistent with a nonlocalizable right ulnar neuropathy, axon loss and demyelinating in type. 2. There is no evidence of carpal tunnel syndrome or a cervical radiculopathy affecting the right side.   ___________________________ Narda Amber, DO    Nerve Conduction Studies Anti Sensory Summary Table   Site NR Peak (ms) Norm Peak (ms) P-T Amp (V) Norm P-T Amp  Right DorsCutan Anti Sensory (Dorsum 5th MC)  35C  Wrist    1.5 <3.1 13.0 >12  Right Median Anti Sensory (2nd Digit)  35C  Wrist    3.3 <3.4 40.5 >20  Right Radial Anti Sensory (Base 1st Digit)  35C  Wrist    2.2 <2.7 24.4 >18  Right Ulnar Anti Sensory (5th Digit)  35C  Wrist    3.0 <3.1 21.6 >12   Motor Summary Table   Site NR Onset (ms) Norm Onset (ms) O-P Amp (mV) Norm O-P Amp Site1 Site2 Delta-0 (ms) Dist  (cm) Vel (m/s) Norm Vel (m/s)  Right Median Motor (Abd Poll Brev)  35C  Wrist    3.5 <3.9 7.6 >6 Elbow Wrist 4.7 26.0 55 >50  Elbow    8.2  7.2         Right Ulnar Motor (Abd Dig Minimi)  35C  Wrist    2.9 <3.1 10.7 >7 B Elbow Wrist 5.4 22.0 41 >50  B Elbow    8.3  8.9  A Elbow B Elbow 2.5 10.0 40 >50  A Elbow    10.8  8.9          Comparison Summary Table   Site NR Peak (ms) Norm Peak (ms) P-T Amp (V) Site1 Site2 Delta-P (ms) Norm Delta (ms)  Right Median/Ulnar Palm Comparison (Wrist - 8cm)  35C  Median Palm    2.2 <2.2 40.7 Median Palm Ulnar Palm 0.3   Ulnar Palm    1.9 <2.2 17.7       EMG   Side Muscle Ins Act Fibs Psw Fasc Number Recrt Dur Dur. Amp Amp. Poly Poly. Comment  Right 1stDorInt Nml Nml Nml Nml 1- Rapid Few 1+ Few 1+ Nml Nml N/A  Right Ext Indicis Nml Nml Nml Nml Nml Nml Nml Nml Nml Nml Nml Nml N/A  Right ABD Dig Min Nml Nml Nml Nml 1- Rapid Some 1+ Some 1+ Nml Nml N/A  Right FlexDigProf 4,5 Nml Nml Nml Nml 1- Rapid Some 1+ Some 1+ Nml Nml N/A  Right PronatorTeres  Nml Nml Nml Nml Nml Nml Nml Nml Nml Nml Nml Nml N/A  Right Biceps Nml Nml Nml Nml Nml Nml Nml Nml Nml Nml Nml Nml N/A  Right Triceps Nml Nml Nml Nml Nml Nml Nml Nml Nml Nml Nml Nml N/A  Right FlexCarpUlnaris Nml Nml Nml Nml 1- Rapid Some 1+ Some 1+ Nml Nml N/A     Waveforms:

## 2014-11-03 ENCOUNTER — Telehealth: Payer: Self-pay | Admitting: Neurology

## 2014-11-03 ENCOUNTER — Other Ambulatory Visit: Payer: Self-pay | Admitting: Neurology

## 2014-11-03 ENCOUNTER — Other Ambulatory Visit: Payer: Self-pay | Admitting: *Deleted

## 2014-11-03 ENCOUNTER — Telehealth: Payer: Self-pay | Admitting: *Deleted

## 2014-11-03 DIAGNOSIS — G609 Hereditary and idiopathic neuropathy, unspecified: Secondary | ICD-10-CM

## 2014-11-03 NOTE — Telephone Encounter (Signed)
-----   Message from Pieter Partridge, DO sent at 11/01/2014 11:39 AM EDT ----- The nerve study does confirm a right ulnar neuropathy, however it is difficult to tell exactly were the pinching is located.  I would like to get an ultrasound of the right ulnar nerve to look for any specific pinching of the nerve. ----- Message -----    From: Alda Berthold, DO    Sent: 10/30/2014   2:28 PM      To: Pieter Partridge, DO

## 2014-11-03 NOTE — Telephone Encounter (Signed)
Returning nurse call from yesterday? She is unsure what it is in regards to? CB# [phone] / Sherri S.

## 2014-11-03 NOTE — Telephone Encounter (Signed)
Patient is aware that  [name] is scheduled at [facility] 11/13/14 6;45am

## 2014-11-03 NOTE — Telephone Encounter (Signed)
Patient is aware of results and U/S has been arranged

## 2014-11-08 ENCOUNTER — Ambulatory Visit (HOSPITAL_COMMUNITY): Admission: RE | Admit: 2014-11-08 | Payer: 59 | Source: Ambulatory Visit

## 2014-11-09 ENCOUNTER — Telehealth: Payer: Self-pay | Admitting: *Deleted

## 2014-11-09 NOTE — Telephone Encounter (Signed)
Patient needing a refill on Primidone 75 mg  Call back number [phone]

## 2014-11-10 ENCOUNTER — Other Ambulatory Visit: Payer: Self-pay | Admitting: *Deleted

## 2014-11-10 ENCOUNTER — Ambulatory Visit (HOSPITAL_COMMUNITY)
Admission: RE | Admit: 2014-11-10 | Discharge: 2014-11-10 | Disposition: A | Discharge status: Home / Self Care | Payer: 59 | Source: Ambulatory Visit | Attending: Neurology | Admitting: Neurology

## 2014-11-10 DIAGNOSIS — G629 Polyneuropathy, unspecified: Secondary | ICD-10-CM | POA: Diagnosis present

## 2014-11-10 DIAGNOSIS — G609 Hereditary and idiopathic neuropathy, unspecified: Secondary | ICD-10-CM

## 2014-11-10 MED ORDER — PRIMIDONE 50 MG PO TABS: 75.0000 mg | ORAL_TABLET | Freq: Every day | ORAL | Status: DC

## 2014-11-10 NOTE — Telephone Encounter (Signed)
Primidone 75 mg sent to pharmacy take 1 and 1/2 tablets at HS # 60 with 3 refills

## 2014-11-13 ENCOUNTER — Other Ambulatory Visit: Payer: Self-pay | Admitting: Internal Medicine

## 2014-11-14 ENCOUNTER — Telehealth: Payer: Self-pay | Admitting: *Deleted

## 2014-11-14 NOTE — Telephone Encounter (Signed)
-----   Message from Pieter Partridge, DO sent at 11/13/2014  1:22 PM EDT ----- Ultrasound of the ulnar nerve is unremarkable.  So, she does have an ulnar neuropathy, but we cannot localize a specific area of the nerve that is causing it.

## 2014-11-14 NOTE — Telephone Encounter (Signed)
Patient is aware that  Ultrasound of the ulnar nerve is unremarkable. So, she does have an ulnar neuropathy, but we cannot localize a specific area of the nerve that is causing it.

## 2014-11-15 ENCOUNTER — Other Ambulatory Visit: Payer: Self-pay | Admitting: *Deleted

## 2014-11-15 MED ORDER — LEVOTHYROXINE SODIUM 112 MCG PO TABS: ORAL_TABLET | ORAL | Status: DC

## 2014-11-22 ENCOUNTER — Encounter: Payer: Self-pay | Admitting: Internal Medicine

## 2014-11-22 ENCOUNTER — Other Ambulatory Visit: Payer: Self-pay

## 2014-11-22 ENCOUNTER — Other Ambulatory Visit (INDEPENDENT_AMBULATORY_CARE_PROVIDER_SITE_OTHER): Payer: 59

## 2014-11-22 ENCOUNTER — Ambulatory Visit (INDEPENDENT_AMBULATORY_CARE_PROVIDER_SITE_OTHER): Payer: 59 | Admitting: Internal Medicine

## 2014-11-22 VITALS — BP 120/68 | HR 69 | Temp 98.6°F | Wt 334.1 lb

## 2014-11-22 VITALS — BP 118/64 | HR 76 | Temp 98.3°F | Resp 16 | Wt 334.0 lb

## 2014-11-22 DIAGNOSIS — E282 Polycystic ovarian syndrome: Secondary | ICD-10-CM

## 2014-11-22 DIAGNOSIS — E119 Type 2 diabetes mellitus without complications: Secondary | ICD-10-CM

## 2014-11-22 DIAGNOSIS — Z23 Encounter for immunization: Secondary | ICD-10-CM | POA: Diagnosis not present

## 2014-11-22 DIAGNOSIS — R635 Abnormal weight gain: Secondary | ICD-10-CM

## 2014-11-22 DIAGNOSIS — E039 Hypothyroidism, unspecified: Secondary | ICD-10-CM

## 2014-11-22 DIAGNOSIS — Z0000 Encounter for general adult medical examination without abnormal findings: Secondary | ICD-10-CM | POA: Diagnosis not present

## 2014-11-22 DIAGNOSIS — Z1231 Encounter for screening mammogram for malignant neoplasm of breast: Secondary | ICD-10-CM

## 2014-11-22 DIAGNOSIS — R252 Cramp and spasm: Secondary | ICD-10-CM

## 2014-11-22 LAB — MICROALBUMIN / CREATININE URINE RATIO
CREATININE, U: 183.4 mg/dL
Microalb Creat Ratio: 0.5 mg/g (ref 0.0–30.0)
Microalb, Ur: 0.9 mg/dL (ref 0.0–1.9)

## 2014-11-22 LAB — URINALYSIS, ROUTINE W REFLEX MICROSCOPIC
Bilirubin Urine: NEGATIVE
KETONES UR: NEGATIVE
Nitrite: NEGATIVE
SPECIFIC GRAVITY, URINE: 1.025 (ref 1.000–1.030)
Total Protein, Urine: NEGATIVE
Urine Glucose: NEGATIVE
Urobilinogen, UA: 0.2 (ref 0.0–1.0)
pH: 6 (ref 5.0–8.0)

## 2014-11-22 LAB — BASIC METABOLIC PANEL
BUN: 27 mg/dL — AB (ref 6–23)
CALCIUM: 9.4 mg/dL (ref 8.4–10.5)
CO2: 31 mEq/L (ref 19–32)
CREATININE: 0.91 mg/dL (ref 0.40–1.20)
Chloride: 99 mEq/L (ref 96–112)
GFR: 69.85 mL/min (ref >60.00)
Glucose, Bld: 88 mg/dL (ref 70–99)
Potassium: 5 mEq/L (ref 3.5–5.1)
SODIUM: 134 meq/L — AB (ref 135–145)

## 2014-11-22 LAB — HEPATIC FUNCTION PANEL
ALT: 21 U/L (ref 0–35)
AST: 15 U/L (ref 0–37)
Albumin: 3.9 g/dL (ref 3.5–5.2)
Alkaline Phosphatase: 56 U/L (ref 39–117)
BILIRUBIN TOTAL: 0.6 mg/dL (ref 0.2–1.2)
Bilirubin, Direct: 0.2 mg/dL (ref 0.0–0.3)
Total Protein: 6.9 g/dL (ref 6.0–8.3)

## 2014-11-22 LAB — CBC WITH DIFFERENTIAL/PLATELET
BASOS ABS: 0 10*3/uL (ref 0.0–0.1)
Basophils Relative: 0.4 % (ref 0.0–3.0)
EOS ABS: 0.2 10*3/uL (ref 0.0–0.7)
Eosinophils Relative: 1.8 % (ref 0.0–5.0)
HEMATOCRIT: 41.4 % (ref 36.0–46.0)
HEMOGLOBIN: 13.9 g/dL (ref 12.0–15.0)
Lymphocytes Relative: 14.9 % (ref 12.0–46.0)
Lymphs Abs: 1.8 10*3/uL (ref 0.7–4.0)
MCHC: 33.5 g/dL (ref 30.0–36.0)
MCV: 93.4 fl (ref 78.0–100.0)
Monocytes Absolute: 0.7 10*3/uL (ref 0.1–1.0)
Monocytes Relative: 5.5 % (ref 3.0–12.0)
NEUTROS ABS: 9.5 10*3/uL — AB (ref 1.4–7.7)
NEUTROS PCT: 77.4 % — AB (ref 43.0–77.0)
Platelets: 216 10*3/uL (ref 150.0–400.0)
RBC: 4.43 Mil/uL (ref 3.87–5.11)
RDW: 13.4 % (ref 11.5–15.5)
WBC: 12.2 10*3/uL — ABNORMAL HIGH (ref 4.0–10.5)

## 2014-11-22 LAB — LIPID PANEL
Cholesterol: 162 mg/dL (ref 0–200)
HDL: 34.7 mg/dL — AB (ref >39.00)
LDL CALC: 92 mg/dL (ref 0–99)
NonHDL: 127.3
TRIGLYCERIDES: 175 mg/dL — AB (ref 0.0–149.0)
Total CHOL/HDL Ratio: 5
VLDL: 35 mg/dL (ref 0.0–40.0)

## 2014-11-22 LAB — T4, FREE: FREE T4: 0.91 ng/dL (ref 0.60–1.60)

## 2014-11-22 LAB — TSH: TSH: 1.56 u[IU]/mL (ref 0.35–4.50)

## 2014-11-22 LAB — HEMOGLOBIN A1C: Hgb A1c MFr Bld: 6.3 % (ref 4.6–6.5)

## 2014-11-22 MED ORDER — PNEUMOCOCCAL 13-VAL CONJ VACC IM SUSP
0.5000 mL | Freq: Once | INTRAMUSCULAR | Status: AC
  Administered 2014-11-22: 0.5 mL via INTRAMUSCULAR

## 2014-11-22 NOTE — Progress Notes (Signed)
Pre visit review using our clinic review tool, if applicable. No additional management support is needed unless otherwise documented below in the visit note. 

## 2014-11-22 NOTE — Addendum Note (Signed)
Addended by: Lyman Bishop on: 11/22/2014 01:51 PM   Modules accepted: Orders

## 2014-11-22 NOTE — Assessment & Plan Note (Signed)
For a1c as per endo, cont wt loss efforts

## 2014-11-22 NOTE — Patient Instructions (Signed)
Please continue Levothyroxine 224 mcg daily.  Please continue Spironolactone 25 mg 2x a day.  Please come back for a follow-up appointment in 6 months.  Please have labs at the appt with Dr. Jenny Reichmann today.

## 2014-11-22 NOTE — Progress Notes (Signed)
Patient ID: Autumn Massey, female   DOB: 1966/02/17, 49 y.o.   MRN: [id number]   HPI  Autumn Massey is a 50 y.o.-year-old female, returning for f/u for hypothyroidism and PCOS. Last visit 6 mo ago.  She lost ~40 lbs before last visit, gained 8 lbs since last visit. She is walking more. Her energy level increased.   She had a URI and sinusitis this month >> ABx.   She has leg cramps from taking Furosemide.   PCOS: Reviewed hx:  - weight gain: Maximal weight 364  - was on Phentermine, which worked initially, then stopped working  - Would consider gastric bypass but only as a last resort - + hirsutism >> started to see facial hair in her 30s - + hair loss >> female pattern, and bald spot in vertex - no acne - + fatigue - had a Mirena IUD placed 04/2013 for heavy menstrual cycles  - had a steroid injection x 1 - last year (knee), and has had Prednisone tapers in last 3 years.  We started Spironolactone >> 50 mg bid, but potassium increased >> we had to decrease to 25 mg bid. Since she is on Spironolactone, which blocks the testosterone receptors >> no repeat testosterone levels needed.  Testosterone was high >> (2015) indicating PCOS  Component     Latest Ref Rng 10/18/2013  Testosterone     10 - 70 ng/dL 78 (H)  Sex Hormone Binding     18 - 114 nmol/L 28  Testosterone Free     0.6 - 6.8 pg/mL 15.6 (H)  Testosterone-% Free     0.4 - 2.4 % 2.0  Androstenedione      93  DHEA-SO4     35 - 430 ug/dL 35    We started Spironolactone. Hair is thickening on Spironolactone. Last potassium: Lab Results  Component Value Date   K 5.1 03/08/2014   She checks sugars 1x a day : fasting 90-100, postprandials: 130-140.   Hypothyroidism: Pt. has been dx with hypothyroidism in 2005; is on Levothyroxine 112 mcg x2 a day, taken: - fasting - in am - with water or tea - with all the other meds - separated by >60 min from b'fast  - no calcium, iron, PPIs, multivitamins   I reviewed pt's  thyroid tests: Lab Results  Component Value Date   TSH 1.692 05/24/2014   TSH 0.33* 03/08/2014   TSH 0.33* 01/27/2014   TSH 1.91 01/10/2014   TSH 4.88* 11/29/2013   TSH 10.04* 10/18/2013   TSH 9.58* 08/11/2013   TSH 4.15 03/15/2012   TSH 3.10 04/04/2011   TSH 6.16* 05/02/2010   FREET4 1.21 05/24/2014   FREET4 1.05 03/08/2014   FREET4 1.38 01/27/2014   FREET4 1.16 01/10/2014   FREET4 0.86 11/29/2013   FREET4 0.75 10/18/2013    Pt denies feeling nodules in neck, hoarseness, dysphagia/odynophagia, SOB with lying down.  She just increased the neurontin to 3x a day (for tremors) but had to decrease to 2x a day today b/c somnolence. She is starting Tegretol to help with the tremors.  She has been tested for Cushing's sd. In 02/2012 by a Dexamethasone suppression test >> cortisol returned at 1.4 (normal). She also had normal urine CA and MN (testing for 2nd HTN) >> no pheochromocytoma.  ROS: Constitutional: see HPI Eyes: no blurry vision, no xerophthalmia ENT: no sore throat, no nodules palpated in throat, no dysphagia/odynophagia, no hoarseness Cardiovascular: no CP/SOB/palpitations/leg swelling Respiratory: no cough/SOB/wheezing Gastrointestinal: no N/V/D/C Musculoskeletal:  no muscle/joint aches, + leg cramps Skin: + rashes (legs) - stasis dermatitis Neurological: + tremors/no numbness/tingling/dizziness  PE: BP 118/64 mmHg  Pulse 76  Temp(Src) 98.3 F (36.8 C) (Oral)  Resp 16  Wt 334 lb (151.501 kg)  SpO2 95% Body mass index is 61.07 kg/(m^2). Wt Readings from Last 3 Encounters:  11/22/14 334 lb (151.501 kg)  10/13/14 330 lb (149.687 kg)  08/30/14 334 lb 0.6 oz (151.52 kg)   Constitutional: obesity class 3 (see below), in NAD, + full subclavic. fat pads Eyes: PERRLA, EOMI, no exophthalmos ENT: moist mucous membranes, no thyromegaly, no cervical lymphadenopathy Cardiovascular: RRR, No MRG Respiratory: CTA B Gastrointestinal: abdomen soft, NT, ND,  BS+ Musculoskeletal: no deformities, strength intact in all 4 Skin: moist, warm, + rash: stasis dermatitis on legs, + Hirsutism (shaves) on chin; + skin tags Neurological: + B tremor with outstretched hands, DTR normal in all 4  BMI Classification:  < 18.5 underweight   18.5-24.9 normal weight   25.0-29.9 overweight   30.0-34.9 class I obesity   35.0-39.9 class II obesity   ? 40.0 class III obesity   ASSESSMENT: 1. Hypothyroidism  2. Weight gain  Component     Latest Ref Rng 10/25/2013  Cortisol (Ur), Free     4.0 - 50.0 mcg/24 h 26.4  Results received     0.63 - 2.50 g/24 h 1.83  Creatinine, Urine      59.9  Creatinine, 24H Ur     700 - 1800 mg/day 1798   3. PCOS  4. DM2   5. Leg cramps  PLAN:  1. Patient with long-standing hypothyroidism, on levothyroxine therapy.  - We again  discussed about correct intake of levothyroxine, fasting, with water, separated by at least 30 minutes from breakfast, and separated by more than 4 hours from calcium, iron, multivitamins, acid reflux medications (PPIs). She is taking it correctly. - She does not appear to have a goiter, thyroid nodules, or neck compression symptoms - we'll check thyroid tests today: TSH, free T4 - If these are abnormal, she will need to return in 6-8 weeks for repeat labs - If these are normal, I will see her back in 6 months  2. Weight gain - MUCH improved before last visit (lost 40 lbs), now having a setback due to recent upper respiratory infection and sinusitis. She has been on antibiotics, now stopped. She uses a fit bit (her goal is 5000 steps in a day, now she is at 3000-4000 and day) and still working on her diet.  - I advised her to start weighing herself once a day or once every second day rather than once a week.  - We also discussed about diet choices - emphasized the need for a low fat diet - we r/o Cushing's sd. 09/2013 by a normal 24h UFC; 02/2012 DST was normal  3. PCOS - cannot check LH  and FSH since on hormonal IUD - She is on Spironolactone >> her body hair has decreased, however, she still has terminal hair on her chin.We could not increase the dose higher than 25 mg twice a day due to increased potassium (5.4) >>   continue 25 mg 2x a day and wicheck a potassium today. No need to repeat the testosterone level since on spironolactone which blocks the testosterone receptors  4. DM2 - We reviewed together her previous 2 hemoglobin A1c levels in these are in the diabetic range  - We discussed about the fact that she can keep  the diabetes under control with diet and exercise along with weight loss and keeping her sugars in check. I gave her sugar log and advised her to check every day or every other day at different times of the day. We also discussed about CBG targets.  - She continues to check her sugars often and they are at goal - We may need to add metformin in the future  - Will check a hemoglobin A1c today >> if this is higher, she will need to return in 3 months  5. Leg cramps - This is a new problem, usually caused by taking furosemide daily  - We will check her potassium today, but I also advised her to start the magnesium supplement, 400 or 500 mg daily   Orders Placed This Encounter  Procedures  . HgB A1c  . TSH  . T4, free  . COMPLETE METABOLIC PANEL WITH GFR   - time spent with the patient: 40 min, of which >50% was spent in counseling about her acute and chronic conditions and about nutrition in general - see discussed topics above   Pt will have the labs drawn at the appt with PCP later today >> will addend results.

## 2014-11-22 NOTE — Assessment & Plan Note (Signed)
Also due for free t4, tsh

## 2014-11-22 NOTE — Progress Notes (Signed)
Subjective:    Patient ID: Autumn Massey, female    DOB: September 27, 1965, 49 y.o.   MRN: [id number]  HPI  Here for wellness and f/u;  Overall doing ok;  Pt denies Chest pain, worsening SOB, DOE, wheezing, orthopnea, PND, worsening LE edema, palpitations, dizziness or syncope.  Pt denies neurological change such as new headache, facial or extremity weakness.  Pt denies polydipsia, polyuria, or low sugar symptoms. Pt states overall good compliance with treatment and medications, good tolerability, and has been trying to follow appropriate diet.  Pt denies worsening depressive symptoms, suicidal ideation or panic. No fever, night sweats, wt loss, loss of appetite, or other constitutional symptoms.  Pt states good ability with ADL's, has low fall risk, home safety reviewed and adequate, no other significant changes in hearing or vision, and only occasionally active with exercise.  Cont's efforts at losing wt. Saw endo earlier today, needs some labs done.  Cont's with 2L Joanna o2 at home qhs with most recent polycythemia labs improved Past Medical History  Diagnosis Date  . ANXIETY DEPRESSION 01/29/2009  . DEPRESSION 11/19/2007  . FREQUENCY, URINARY 10/17/2009  . HYPERLIPIDEMIA 11/19/2007  . HYPERTENSION 11/19/2007  . HYPOTHYROIDISM 11/19/2007  . PNEUMONIA 08/06/2010  . POLYCYTHEMIA 01/29/2009  . Preventative health care 04/06/2011  . SLEEP RELATED HYPOVENTILATION/HYPOXEMIA CCE 02/08/2009  . Morbid obesity   . Venous insufficiency 07/19/2012  . Sleep apnea     mild sleep apnea, on o2 at 2l at nighttime   Past Surgical History  Procedure Laterality Date  . Tonsillectomy    . Ulnar tunnel release Left 02/22/2013    Procedure: LEFT ULNAR NERVE DECOMPRESSION ;  Surgeon: Tennis Must, MD;  Location: [facility];  Service: Orthopedics;  Laterality: Left;    reports that she has been smoking.  She has never used smokeless tobacco. She reports that she drinks alcohol. She reports that she does not use  illicit drugs. family history includes Alcohol abuse in her other; Arthritis in her other; Cancer in her maternal grandfather and mother; Hypertension in her other. No Known Allergies Current Outpatient Prescriptions on File Prior to Visit  Medication Sig Dispense Refill  . albuterol (PROVENTIL HFA;VENTOLIN HFA) 108 (90 BASE) MCG/ACT inhaler Inhale 2 puffs into the lungs every 6 (six) hours as needed for wheezing or shortness of breath.    [name] aspirin EC 81 MG tablet Take 81 mg by mouth daily.    . clonazePAM (KLONOPIN) 0.5 MG disintegrating tablet DISSOLVE ONE TABLET IN MOUTH TWICE DAILY AS NEEDED FOR ANXIETY 60 tablet 2  . FLUoxetine (PROZAC) 20 MG capsule TAKE 1 CAPSULE BY MOUTH ONCE DAILY 90 capsule 3  . furosemide (LASIX) 40 MG tablet TAKE ONE TABLET BY MOUTH ONCE DAILY 90 tablet 0  . hydrochlorothiazide (HYDRODIURIL) 25 MG tablet TAKE 1 TABLET BY MOUTH ONCE DAILY 90 tablet 1  . irbesartan (AVAPRO) 300 MG tablet TAKE ONE TABLET BY MOUTH AT BEDTIME 90 tablet 0  . ketoconazole (NIZORAL) 2 % cream Apply 1 application topically daily. 15 g 1  . labetalol (NORMODYNE) 200 MG tablet TAKE 1 TABLET BY MOUTH TWICE DAILY 60 tablet 2  . levothyroxine (SYNTHROID, LEVOTHROID) 112 MCG tablet TAKE 2 TABLETS (224 MCG TOTAL) BY MOUTH DAILY BEFORE BREAKFAST. 120 tablet 0  . Multiple Vitamin (MULTIVITAMIN) tablet Take 1 tablet by mouth daily.    . NON FORMULARY Oxygen 2L use as bedtime    . primidone (MYSOLINE) 50 MG tablet Take 1.5 tablets (75 mg  total) by mouth at bedtime. 60 tablet 3  . simvastatin (ZOCOR) 40 MG tablet TAKE 1 TABLET BY MOUTH AT BEDTIME 90 tablet 2  . spironolactone (ALDACTONE) 25 MG tablet TAKE 1 TABLET BY MOUTH TWICE A DAY 180 tablet 1  . HYDROcodone-homatropine (HYCODAN) 5-1.5 MG/5ML syrup 1 tsp 4 times daily as needed for cough    . levofloxacin (LEVAQUIN) 750 MG tablet Take 750 mg by mouth daily.    . primidone (MYSOLINE) 50 MG tablet Take 50 mg by mouth 4 (four) times daily.     No  current facility-administered medications on file prior to visit.   Review of Systems Constitutional: Negative for increased diaphoresis, other activity, appetite or siginficant weight change other than noted HENT: Negative for worsening hearing loss, ear pain, facial swelling, mouth sores and neck stiffness.   Eyes: Negative for other worsening pain, redness or visual disturbance.  Respiratory: Negative for shortness of breath and wheezing  Cardiovascular: Negative for chest pain and palpitations.  Gastrointestinal: Negative for diarrhea, blood in stool, abdominal distention or other pain Genitourinary: Negative for hematuria, flank pain or change in urine volume.  Musculoskeletal: Negative for myalgias or other joint complaints.  Skin: Negative for color change and wound or drainage.  Neurological: Negative for syncope and numbness. other than noted Hematological: Negative for adenopathy. or other swelling Psychiatric/Behavioral: Negative for hallucinations, SI, self-injury, decreased concentration or other worsening agitation.      Objective:   Physical Exam BP 120/68 mmHg  Pulse 69  Temp(Src) 98.6 F (37 C) (Oral)  Wt 334 lb 1.9 oz (151.556 kg)  SpO2 95% VS noted,  Constitutional: Pt is oriented to person, place, and time. Appears well-developed and well-nourished, in no significant distress Head: Normocephalic and atraumatic.  Right Ear: External ear normal.  Left Ear: External ear normal.  Nose: Nose normal.  Mouth/Throat: Oropharynx is clear and moist.  Eyes: Conjunctivae and EOM are normal. Pupils are equal, round, and reactive to light.  Neck: Normal range of motion. Neck supple. No JVD present. No tracheal deviation present or significant neck LA or mass Cardiovascular: Normal rate, regular rhythm, normal heart sounds and intact distal pulses.   Pulmonary/Chest: Effort normal and breath sounds without rales or wheezing  Abdominal: Soft. Bowel sounds are normal. NT. No  HSM  Musculoskeletal: Normal range of motion. Exhibits no edema.  Lymphadenopathy:  Has no cervical adenopathy.  Neurological: Pt is alert and oriented to person, place, and time. Pt has normal reflexes. No cranial nerve deficit. Motor grossly intact Skin: Skin is warm and dry. No rash noted.  Psychiatric:  Has normal mood and affect. Behavior is normal.     Assessment & Plan:

## 2014-11-22 NOTE — Assessment & Plan Note (Addendum)
Overall doing well, age appropriate education and counseling updated, referrals for preventative services and immunizations addressed, dietary and smoking counseling addressed, most recent labs reviewed.  I have personally reviewed and have noted:  1) the patient's medical and social history 2) The pt's use of alcohol, tobacco, and illicit drugs 3) The patient's current medications and supplements 4) Functional ability including ADL's, fall risk, home safety risk, hearing and visual impairment 5) Diet and physical activities 6) Evidence for depression or mood disorder 7) The patient's height, weight, and BMI have been recorded in the chart  I have made referrals, and provided counseling and education based on review of the above For wellness labs, ECG reviewed as per emr, for [employer]

## 2014-11-22 NOTE — Patient Instructions (Addendum)
You had the Prevnar pneumonia shot today  Please continue all other medications as before, and refills have been done if requested.  Please have the pharmacy call with any other refills you may need.  Please continue your efforts at being more active, low cholesterol diet, and weight control.  You are otherwise up to date with prevention measures today.  Please keep your appointments with your specialists as you may have planned  Please go to the LAB in the Basement (turn left off the elevator) for the tests to be done today  You will be contacted by phone if any changes need to be made immediately.  Otherwise, you will receive a letter about your results with an explanation, but please check with MyChart first.  Please remember to sign up for MyChart if you have not done so, as this will be important to you in the future with finding out test results, communicating by private email, and scheduling acute appointments online when needed.  Please return in 6 months, or sooner if needed 

## 2014-11-29 ENCOUNTER — Ambulatory Visit
Admission: RE | Admit: 2014-11-29 | Discharge: 2014-11-29 | Disposition: A | Discharge status: Home / Self Care | Payer: 59 | Source: Ambulatory Visit

## 2014-11-29 ENCOUNTER — Encounter: Payer: Self-pay | Admitting: Neurology

## 2014-11-29 ENCOUNTER — Ambulatory Visit (INDEPENDENT_AMBULATORY_CARE_PROVIDER_SITE_OTHER): Payer: 59 | Admitting: Neurology

## 2014-11-29 VITALS — BP 136/74 | HR 74 | Resp 20 | Ht 62.0 in | Wt 339.4 lb

## 2014-11-29 DIAGNOSIS — Z1231 Encounter for screening mammogram for malignant neoplasm of breast: Secondary | ICD-10-CM

## 2014-11-29 DIAGNOSIS — R202 Paresthesia of skin: Secondary | ICD-10-CM

## 2014-11-29 DIAGNOSIS — G5621 Lesion of ulnar nerve, right upper limb: Secondary | ICD-10-CM

## 2014-11-29 DIAGNOSIS — R252 Cramp and spasm: Secondary | ICD-10-CM | POA: Diagnosis not present

## 2014-11-29 DIAGNOSIS — G250 Essential tremor: Secondary | ICD-10-CM | POA: Diagnosis not present

## 2014-11-29 DIAGNOSIS — R200 Anesthesia of skin: Secondary | ICD-10-CM

## 2014-11-29 MED ORDER — PRIMIDONE 50 MG PO TABS
100.0000 mg | ORAL_TABLET | Freq: Every day | ORAL | Status: DC
Start: 2014-11-29 — End: 2015-04-20

## 2014-11-29 NOTE — Patient Instructions (Signed)
1.  Increase primidone to 100mg  at bedtime 2.  We will send you to Dr. Tamala Julian, a [facility] doctor regarding the right hand and elbow 3.  Try drinking Tonic Water for the cramps 4.  Call in 4 weeks with update.  Follow up in 3 months.

## 2014-11-29 NOTE — Progress Notes (Signed)
NEUROLOGY FOLLOW UP OFFICE NOTE  Autumn Massey [id number]  HISTORY OF PRESENT ILLNESS: Autumn Massey is a 49 year old right-handed woman with hypothyroidism, hyperlipidemia, impaired glucose tolerance, hypertension, hypoventilation, cerebrovascular disease and former smoker who follows up for left ulnar neuropathy and essential tremor.  UPDATE: She had a NCV-EMG of the right upper extremity on 10/30/14, which showed a non-localizable right ulnar neuropathy.  There was no evidence for carpal tunnel syndrome or cervical radiculopathy.  An ultrasound of the right ulnar nerve was performed, in order to see if there was sign of compression, but it was normal.  For tremor, she is taking primidone 75mg  at bedtime.  Tremors have improved.  Writing is better.  However, it tends to recur by the end of the day.    She also notes muscle cramps in her legs, mostly her thighs.  It usually occurs at night.  She takes spironolactone but her K has been okay.  She is on statin therapy.  She was started on magnesium.  HISTORY: She underwent left ulnar decompression surgery in August 2014.  When the cast was removed the following month, she noted twitching of her 5th digit.Autumn Massey  An MRI of the cervical spine was performed in December 2014, which revealed no structural etiology for radiculopathy.  She was evaluated by Dr. Catalina Gravel, a neurologist in [place], who performed a [place] in January 2015.  It was essentially a normal exam.  She continues to have numbness, paresthesias and weakness of the 4th and 5th digits of the left hand.  She later developed tremor in both hands, more noticeable in the right hand since she is right-hand dominant.  It occurs when she is writing or holding utensils.  She denies gait difficulty, rigidity, lack of sense of smell, or history consistent with REM sleep behavior.  She is concerned about Parkinson's disease because her maternal grandfather and uncle had it.  Another uncle has tremor.   She also notes numbness and tingling involving the entire right hand.  It is noticeable when she holds a phone or when she is driving any significant distance.  She is able to shake it out.  She feels the numbness is worse when her elbow is bent and it causes shooting pain from the elbow up her forearm.  She does not wake up with it.  She tried Lyrica, which she could not tolerate.  She was then seen by neurology at [facility] for the tremor.  She was started on gabapentin.  She could not tolerate it.    Because of headache, Dr. Catalina Gravel also ordered an MRI of the brain was performed in February 2015, which showed remote punctate infarct.  She is taking ASA 81mg  daily  PAST MEDICAL HISTORY: Past Medical History  Diagnosis Date  . ANXIETY DEPRESSION 01/29/2009  . DEPRESSION 11/19/2007  . FREQUENCY, URINARY 10/17/2009  . HYPERLIPIDEMIA 11/19/2007  . HYPERTENSION 11/19/2007  . HYPOTHYROIDISM 11/19/2007  . PNEUMONIA 08/06/2010  . POLYCYTHEMIA 01/29/2009  . Preventative health care 04/06/2011  . SLEEP RELATED HYPOVENTILATION/HYPOXEMIA CCE 02/08/2009  . Morbid obesity   . Venous insufficiency 07/19/2012  . Sleep apnea     mild sleep apnea, on o2 at 2l at nighttime    MEDICATIONS: Current Outpatient Prescriptions on File Prior to Visit  Medication Sig Dispense Refill  . albuterol (PROVENTIL HFA;VENTOLIN HFA) 108 (90 BASE) MCG/ACT inhaler Inhale 2 puffs into the lungs every 6 (six) hours as needed for wheezing or shortness of breath.    Autumn Massey  aspirin EC 81 MG tablet Take 81 mg by mouth daily.    . clonazePAM (KLONOPIN) 0.5 MG disintegrating tablet DISSOLVE ONE TABLET IN MOUTH TWICE DAILY AS NEEDED FOR ANXIETY 60 tablet 2  . FLUoxetine (PROZAC) 20 MG capsule TAKE 1 CAPSULE BY MOUTH ONCE DAILY 90 capsule 3  . furosemide (LASIX) 40 MG tablet TAKE ONE TABLET BY MOUTH ONCE DAILY 90 tablet 0  . hydrochlorothiazide (HYDRODIURIL) 25 MG tablet TAKE 1 TABLET BY MOUTH ONCE DAILY 90 tablet 1  . HYDROcodone-homatropine (HYCODAN)  5-1.5 MG/5ML syrup 1 tsp 4 times daily as needed for cough    . irbesartan (AVAPRO) 300 MG tablet TAKE ONE TABLET BY MOUTH AT BEDTIME 90 tablet 0  . ketoconazole (NIZORAL) 2 % cream Apply 1 application topically daily. 15 g 1  . labetalol (NORMODYNE) 200 MG tablet TAKE 1 TABLET BY MOUTH TWICE DAILY 60 tablet 2  . levofloxacin (LEVAQUIN) 750 MG tablet Take 750 mg by mouth daily.    Autumn Massey levothyroxine (SYNTHROID, LEVOTHROID) 112 MCG tablet TAKE 2 TABLETS (224 MCG TOTAL) BY MOUTH DAILY BEFORE BREAKFAST. 120 tablet 0  . Multiple Vitamin (MULTIVITAMIN) tablet Take 1 tablet by mouth daily.    . NON FORMULARY Oxygen 2L use as bedtime    . simvastatin (ZOCOR) 40 MG tablet TAKE 1 TABLET BY MOUTH AT BEDTIME 90 tablet 2  . spironolactone (ALDACTONE) 25 MG tablet TAKE 1 TABLET BY MOUTH TWICE A DAY 180 tablet 1   No current facility-administered medications on file prior to visit.    ALLERGIES: No Known Allergies  FAMILY HISTORY: Family History  Problem Relation Age of Onset  . Cancer Mother     melanoma   . Cancer Maternal Grandfather     prostate cancer  . Alcohol abuse Other   . Arthritis Other   . Hypertension Other     SOCIAL HISTORY: History   Social History  . Marital Status: Divorced    Spouse Name: N/A  . Number of Children: 0  . Years of Education: 12   Occupational History  . customer serv. manager [employer]    Social History Main Topics  . Smoking status: Current Every Day Smoker -- 0.25 packs/day  . Smokeless tobacco: Never Used     Comment: started at age 51, less than 1 ppd.  . Alcohol Use: 0.0 oz/week    0 Standard drinks or equivalent per week     Comment: rare  . Drug Use: No  . Sexual Activity: No   Other Topics Concern  . Not on file   Social History Narrative    REVIEW OF SYSTEMS: Constitutional: No fevers, chills, or sweats, no generalized fatigue, change in appetite Eyes: No visual changes, double vision, eye pain Ear, nose and throat:  No hearing loss, ear pain, nasal congestion, sore throat Cardiovascular: No chest pain, palpitations Respiratory:  No shortness of breath at rest or with exertion, wheezes GastrointestinaI: No nausea, vomiting, diarrhea, abdominal pain, fecal incontinence Genitourinary:  No dysuria, urinary retention or frequency Musculoskeletal:  No neck pain, back pain Integumentary: No rash, pruritus, skin lesions Neurological: as above Psychiatric: No depression, insomnia, anxiety Endocrine: No palpitations, fatigue, diaphoresis, mood swings, change in appetite, change in weight, increased thirst Hematologic/Lymphatic:  No anemia, purpura, petechiae. Allergic/Immunologic: no itchy/runny eyes, nasal congestion, recent allergic reactions, rashes  PHYSICAL EXAM: Filed Vitals:   11/29/14 1516  BP: 136/74  Pulse: 74  Resp: 20   General: No acute distress Head:  Normocephalic/atraumatic  IMPRESSION: Right ulnar neuropathy with hand numbness.  Hand numbness is non-localizable.  However, she notes radiating shooting pain from the elbow up the forearm, so it is likely playing a role. Essential tremor Muscle cramps Cerebrovascular disease Morbid obesity  PLAN: Increase primidone to 100mg  at bedtime On ASA 81mg  daily Refer to Sports Medicine for non-invasive treatment of ulnar neuropathy Tonic water Weight loss Follow up in 3 months.  Call in 4 weeks with update.  15 minutes spent face to face with patient, 100% spent discussing EMG results, [name] results and management.  Metta Clines, DO  CC:  [name], MD

## 2014-12-06 ENCOUNTER — Other Ambulatory Visit: Payer: Self-pay | Admitting: Internal Medicine

## 2014-12-19 ENCOUNTER — Encounter: Payer: Self-pay | Admitting: Family Medicine

## 2014-12-19 ENCOUNTER — Other Ambulatory Visit (INDEPENDENT_AMBULATORY_CARE_PROVIDER_SITE_OTHER): Payer: 59

## 2014-12-19 ENCOUNTER — Ambulatory Visit (INDEPENDENT_AMBULATORY_CARE_PROVIDER_SITE_OTHER): Payer: 59 | Admitting: Family Medicine

## 2014-12-19 VITALS — BP 108/70 | HR 76 | Ht 62.0 in | Wt 332.0 lb

## 2014-12-19 DIAGNOSIS — M25521 Pain in right elbow: Secondary | ICD-10-CM | POA: Diagnosis not present

## 2014-12-19 DIAGNOSIS — G5621 Lesion of ulnar nerve, right upper limb: Secondary | ICD-10-CM

## 2014-12-19 NOTE — Progress Notes (Signed)
Pre visit review using our clinic review tool, if applicable. No additional management support is needed unless otherwise documented below in the visit note. 

## 2014-12-19 NOTE — Assessment & Plan Note (Signed)
Patient's pain is secondary to more of an ulnar subluxation. Patient is 50% subluxation of the ulnar nerve within the groove at the elbow on the medial aspect. Patient does have pain in this area as well that correlates with it. Mild hypoechoic changes noted on ultrasound. I believe the patient is having some subluxation and we discussed compression, home exercises, icing and topical anti-inflammatory. Patient will try this conservative therapy for 3 weeks if continuing have pain we'll consider injection.

## 2014-12-19 NOTE — Progress Notes (Signed)
[name] [address] [place], [place] [address] Phone: [phone] Subjective:    I'm seeing this patient by the request  of:  Tomi Likens md  CC: Right upper extremity pain  UXN:[id number] Autumn Massey is a 49 y.o. female coming in with complaint of right arm pain. Patient has many different comorbidities but is coming in with a right arm pain. Patient does have a past medical history significant for a left ulnar decompression surgery in August 2014. Patient unfortunately has having similar presentations on the right side. Patient was seen by neurology and on 10/30/2014 patient had a nonlocalized ulnar neuropathy noted on EMG. Patient also had an ultrasound of the right ulnar nerve which did not show any type of compression at that time. Patient did have decompression on the contralateral side but she would like to avoid this secondary to postsurgical competitions. Patient also has other underlying tremor that she is being treated for. Patient states that the severity of pain as 5 out of 10 but she does not want to have as much pain that she had previously. This is still allowing her to do her daily activities but she has to be very careful. Patient is avoiding significant compression on her elbow or wrist. Has taken anti-inflammatory's with mild some moderate improvement. Patient does need to stay away from those medicines long-term she states.  Past Medical History  Diagnosis Date  . ANXIETY DEPRESSION 01/29/2009  . DEPRESSION 11/19/2007  . FREQUENCY, URINARY 10/17/2009  . HYPERLIPIDEMIA 11/19/2007  . HYPERTENSION 11/19/2007  . HYPOTHYROIDISM 11/19/2007  . PNEUMONIA 08/06/2010  . POLYCYTHEMIA 01/29/2009  . Preventative health care 04/06/2011  . SLEEP RELATED HYPOVENTILATION/HYPOXEMIA CCE 02/08/2009  . Morbid obesity   . Venous insufficiency 07/19/2012  . Sleep apnea     mild sleep apnea, on o2 at 2l at nighttime   Past Surgical History  Procedure Laterality Date  .  Tonsillectomy    . Ulnar tunnel release Left 02/22/2013    Procedure: LEFT ULNAR NERVE DECOMPRESSION ;  Surgeon: Tennis Must, MD;  Location: [facility];  Service: Orthopedics;  Laterality: Left;   History  Substance Use Topics  . Smoking status: Current Every Day Smoker -- 0.25 packs/day  . Smokeless tobacco: Never Used     Comment: started at age 69, less than 1 ppd.  . Alcohol Use: 0.0 oz/week    0 Standard drinks or equivalent per week     Comment: rare   No Known Allergies Family History  Problem Relation Age of Onset  . Cancer Mother     melanoma   . Cancer Maternal Grandfather     prostate cancer  . Alcohol abuse Other   . Arthritis Other   . Hypertension Other        Past medical history, social, surgical and family history all reviewed in electronic medical record.   Review of Systems: No headache, visual changes, nausea, vomiting, diarrhea, constipation, dizziness, abdominal pain, skin rash, fevers, chills, night sweats, weight loss, swollen lymph nodes, body aches, joint swelling, muscle aches, chest pain, shortness of breath, mood changes.   Objective Blood pressure 108/70, pulse 76, height 5\' 2"  (1.575 m), weight 332 lb (150.594 kg), SpO2 95 %.  General: No apparent distress alert and oriented x3 mood and affect normal, dressed appropriately.  HEENT: Pupils equal, extraocular movements intact  Respiratory: Patient's speak in full sentences and does not appear short of breath  Cardiovascular: No lower extremity edema,  non tender, no erythema  Skin: Warm dry intact with no signs of infection or rash on extremities or on axial skeleton.  Abdomen: Soft nontender  Neuro: Cranial nerves II through XII are intact, neurovascularly intact in all extremities with 2+ DTRs and 2+ pulses.  Lymph: No lymphadenopathy of posterior or anterior cervical chain or axillae bilaterally.  Gait normal with good balance and coordination.  MSK:  Non tender with full range  of motion and good stability and symmetric strength and tone of shoulders,   hip, knee and ankles bilaterally.  Wrist: right  Inspection normal with no visible erythema or swelling. ROM smooth and normal with good flexion and extension and ulnar/radial deviation that is symmetrical with opposite wrist. Palpation is normal over metacarpals, navicular, lunate, and TFCC; tendons without tenderness/ swelling No snuffbox tenderness. No tenderness over Canal of [place]. Strength 5/5 in all directions without pain. Negative Finkelstein, tinel's and phalens. Negative Watson's test.  Elbow: right  Unremarkable to inspection. Range of motion full pronation, supination, flexion, extension. Strength is full to all of the above directions Stable to varus, valgus stress. Negative moving valgus stress test. Tender over the medial epicondylar region. Positive subluxing of the ulnar nerve with symptomatic findings Positive cubital tunnel Tinel's. Contralateral elbow does have a positive cubital tunnel Tinel's as well but less tender to palpation.  Musculoskeletal ultrasound was performed and interpreted by Charlann Boxer D.O.   Elbow:  Lateral epicondyle and common extensor tendon origin visualized.  No edema, effusions, or avulsions seen.  Radial head unremarkable and located in annular ligament Medial epicondyle and common flexor tendon origin visualized.  No edema, effusions, or avulsions seen.  Ulnar nerve in cubital tunnel with mild increase in diameter as well as 50% subluxation noted Olecranon and triceps insertion visualized and unremarkable without edema, effusion, or avulsion.  No signs olecranon bursitis. Power doppler signal normal.  IMPRESSION:  Subluxation of the ulnar nerve  Procedure note 17510; 15 minutes spent for Therapeutic exercises as stated in above notes.  This included exercises focusing on stretching, strengthening, with significant focus on eccentric aspects.  She given  strengthening exercises for more stabilization of the medial aspect working on pronation and supination. Proper technique shown and discussed handout in great detail with ATC.  All questions were discussed and answered.     Impression and Recommendations:     This case required medical decision making of moderate complexity.

## 2014-12-19 NOTE — Patient Instructions (Signed)
Good to see you Keep away from Jenny Reichmann he is a bad influence.  Ice 20 minutes 2 times daily. Usually after activity and before bed. Exercises 3 times a week.  Try compression sleeves to give support.  pennsaid pinkie amount topically 2 times daily as needed.  B12 1069mcg daily B6 200mg  daily See me again in 3-4 weeks and if not perfect we will try injection.

## 2014-12-27 ENCOUNTER — Ambulatory Visit: Payer: 59 | Admitting: Family Medicine

## 2014-12-27 ENCOUNTER — Telehealth: Payer: Self-pay | Admitting: Internal Medicine

## 2014-12-27 MED ORDER — CLONAZEPAM 0.5 MG PO TABS: 0.5000 mg | ORAL_TABLET | Freq: Two times a day (BID) | ORAL | Status: DC | PRN

## 2014-12-27 NOTE — Telephone Encounter (Signed)
Done hardcopy to Dahlia  

## 2014-12-27 NOTE — Telephone Encounter (Signed)
Rx faxed to pharmacy  

## 2014-12-27 NOTE — Telephone Encounter (Signed)
Patient is requesting a refill of   clonazePAM (KLONOPIN) 0.5 MG disintegrating tablet [[id number]]      Sent to [facility] on [address]

## 2015-01-12 ENCOUNTER — Telehealth: Payer: Self-pay | Admitting: Family Medicine

## 2015-01-12 NOTE — Telephone Encounter (Signed)
Noted  

## 2015-01-12 NOTE — Telephone Encounter (Signed)
Patient wanted to let you know the steroid cream is giving her some relief.  She went ahead and moved her appointment out a couple more weeks

## 2015-01-15 ENCOUNTER — Ambulatory Visit: Payer: 59 | Admitting: Family Medicine

## 2015-01-29 ENCOUNTER — Encounter: Payer: Self-pay | Admitting: Family Medicine

## 2015-01-29 ENCOUNTER — Ambulatory Visit (INDEPENDENT_AMBULATORY_CARE_PROVIDER_SITE_OTHER): Payer: 59 | Admitting: Family Medicine

## 2015-01-29 VITALS — BP 124/84 | HR 81 | Ht 62.0 in | Wt 333.0 lb

## 2015-01-29 DIAGNOSIS — G540 Brachial plexus disorders: Secondary | ICD-10-CM | POA: Diagnosis not present

## 2015-01-29 DIAGNOSIS — G5621 Lesion of ulnar nerve, right upper limb: Secondary | ICD-10-CM | POA: Diagnosis not present

## 2015-01-29 NOTE — Progress Notes (Signed)
Pre visit review using our clinic review tool, if applicable. No additional management support is needed unless otherwise documented below in the visit note. 

## 2015-01-29 NOTE — Patient Instructions (Signed)
Good to see you Ice is your friend when needed  new exercises for thoracic outlet syndrome.  Conitnue the medications Vitamin D 2000 IU daily B12 1028mcg daily B6 200mg  daily See me again in 1 month.    Thoracic Outlet Syndrome  with Rehab Thoracic outlet syndrome is a condition in which nerves, and vessels (less common), are compressed or squeezed in the chest (thoracic) cavity. This results in pain and weakness in the shoulders, arms, and hands.  SYMPTOMS   Signs of nerve damage: pain, numbness, and tingling in the arm or hand.  Arm and hand weakness.  Signs of vascular damage: coldness, inflammation, blue or pale color in the hand and fingers (uncommon). CAUSES  Thoracic outlet syndrome is caused by the squeezing of nerves, and possibly vessels, in the chest cavity, before they extend into the arm and hand. Just before leaving the chest cavity, the bundle of nerves and vessels (brachial plexus) passes by the collarbone (clavicle) and ribs. In this area, the bundle of nerves can come under increased pressure, which may result in thoracic outlet syndrome. Common sources of pressure include:  Rib cage.  Fracture of the first rib or clavicle.  Neck muscles that are too large.  Extending the arm above the head for a long period of time (during sleep or while unconscious).  Tumor (rare). RISK INCREASES WITH:  Break (fracture) of the clavicle or first rib.  Neck muscles that become too large from bodybuilding.  Fast weight loss combined with vigorous physical exercise. PREVENTION  Avoid activities where shoulder or neck injury are likely.  Wear properly fitted and padded protective equipment.  Maintain good posture.  Avoid carrying a bag or backpack that places pressure on the affected side.  Change sleeping positions. Try sleeping on one side, or sleep without a firm pillow.  Avoid building large neck muscles. PROGNOSIS  If treated properly, symptoms of thoracic  outlet syndrome normally go away with nonsurgical treatment. Sometimes, surgery is necessary to free the bundle of nerves from pressure.  RELATED COMPLICATIONS   Permanent nerve damage, including pain, numbness, tingling, or weakness.  Recurring symptoms that result in an ongoing problem.  Clotting (thrombosis) of the axillary vein in the shoulder. This is an emergency that needs to be treated immediately.  Risks of surgery: infection, bleeding, nerve damage, or damage to surrounding tissues. TREATMENT  Treatment first involves resting from activities that aggravate the symptoms and the use of ice and medicine to reduce pain and inflammation. The use of strengthening and stretching exercises may help reduce pain with activity. These exercises may be performed at home or with a therapist. It is important to improve posture and adjust sleeping habits to reduce the symptoms. If symptoms continue for more than 6 months despite nonsurgical treatment, surgery may be recommended. MEDICATION   If pain medicine is necessary, nonsteroidal anti-inflammatory medicines (aspirin and ibuprofen), or other minor pain relievers (acetaminophen), are often recommended.  Do not take pain medicine for 7 days before surgery.  Prescription pain relievers may be given if your caregiver thinks they are necessary. Use only as directed and only as much as you need. SEEK MEDICAL CARE IF:   Treatment does not help, or the condition gets worse.  Any medicines produce negative side effects.  Any complications from surgery occur:  Pain, numbness, or coldness in the affected arm and hand.  Discoloration beneath the fingernails (blue or gray) of the affected hand.  Signs of infections: fever, pain, inflammation, redness, or  persistent bleeding. EXERCISES RANGE OF MOTION (ROM) AND STRETCHING EXERCISES - Thoracic Outlet Syndrome These exercises may help you when beginning to recover from your injury. In order to  successfully stop your symptoms, you must improve your posture. These exercises are designed to help reduce the forward-head and rounded-shoulder posture that contributes to this condition. Your symptoms may go away with or without further involvement from your physician, physical therapist or athletic trainer. While completing these exercises, remember:   Restoring tissue flexibility helps return normal motion to the joints. This allows healthier, less painful movement and activity.  An effective stretch should be held for at least 30 seconds. Neck muscles are easily irritated, even if they do not seem to be bothered at the time of an exercise. It is often best to begin your stretches with hold times as short as 5 seconds and build up gradually.  A stretch should never be painful. You should only feel a gentle lengthening or release in the stretched tissue. STRETCH - Axial Extension  Stand or sit on a firm surface. Assume a good posture: chest up, shoulders drawn back, abdominal (stomach) muscles slightly tense, knees unlocked (if standing), and feet hip width apart.  Slowly pull your chin back, so your head slides back and your chin slightly lowers. Continue to look straight ahead.  You should feel a gentle stretch in the back of your head. You should be certain not to feel a strong stretch, since this can cause headaches later.  Hold for __________ seconds. Repeat __________ times. Complete this exercise __________ times per day. RANGE OF MOTION- Upper Thoracic Extension  Sit on a firm chair with a high back. Assume a good posture: chest up, shoulders drawn back, abdominal muscles slightly tense, and feet hip width apart. Place a small pillow or folded towel in the curve of your lower back, if you are having difficulty maintaining good posture.  Gently brace your neck with your hands, allowing your arms to rest on your chest.  Continue to support your neck and slowly extend your back over  the chair. You will feel a stretch across your upper back.  Hold __________ seconds. Slowly return to the starting position. Repeat __________ times. Complete this exercise __________ times per day. STRETCH - Mid-thoracic Extension  Tape two tennis balls together, or secure them side-by-side in a sock.  Find a firm surface to lie on. Position the tennis balls so that one lies on each side of your spine, and they are both between your shoulder blades. You may bend your knees if you choose.  Remain in this position for 20 to 30 seconds. Gently move your body 1 to 2 inches up (in the direction of your head), so that the balls now roll on a slightly lower part of your back. Hold this position another 20-30 seconds. Continue to move the balls down your spine until you no longer feel a stretching sensation. Repeat exercise __________ times, __________ times per day. STRENGTHENING EXERCISES - Thoracic Outlet Syndrome These exercises may help you when beginning to recover from your injury. In order to successfully stop your symptoms, you must improve your posture. These exercises are designed to help reduce the forward-head and rounded-shoulder posture that contributes to this condition. Your symptoms may go away with or without further involvement from your physician, physical therapist or athletic trainer. While completing these exercises, remember:   Muscles can gain both the endurance and the strength needed for everyday activities through controlled exercises.  Complete these exercises as instructed by your physician, physical therapist, or athletic trainer. Increase the resistance and repetitions only as guided.  You may experience muscle soreness or fatigue, but the pain or discomfort you are trying to eliminate should never worsen during these exercises. If this pain does get worse, stop and make sure you are following the directions exactly. If the pain is still present after adjustments, stop  the exercise until you can discuss the trouble with your caretaker. STRENGTH - Scapular Retractors  Secure a rubber exercise band or tubing around a fixed object (i.e., table, pole), so that it is at the height of your shoulders when you are either standing, or sitting on a firm, armless chair.  With a palm-down grip, grasp an end of the band in each hand. Straighten your elbows out and lift your hands straight in front of you at shoulder height. Step back, away from the secured end of the band, until it becomes tense.  Squeezing your shoulder blades together, draw your elbows back as you bend them. Keep your upper arm lifted up, away from the sides of your body, throughout the exercise.  Hold __________ seconds. Slowly ease the tension on the band as you reverse the directions and return to the starting position. Repeat __________ times. Complete this exercise __________ times per day. POSTURE - Thoracic Outlet Syndrome Keeping correct posture when sitting, standing, or completing your activities will reduce the stress put on different body tissues. This will allow injured tissues a chance to heal and limit painful experiences. The following are general guidelines for improved posture. Your physician or physical therapist will provide you with any instructions specific to your needs. While reading these guidelines, remember:  The exercises prescribed by your caregiver will help you develop the flexibility and strength to maintain correct postures.  Correct posture provides the best environment for your joints to work. All your joints have less wear and tear when properly supported by a spine with good posture. This means you will experience a healthier, less painful body.  Correct posture must be practiced with all of your activities, especially prolonged sitting and standing. Correct posture is as important during repetitive, low-stress activities (i.e., typing) as it is when doing a single,  heavy-load activity (i.e., lifting). PROLONGED STANDING WHILE SLIGHTLY LEANING FORWARD When completing a task that requires you to lean forward, while standing in one place for a long time, place one foot up on a stationary 2- to 4-inch-high object to help maintain the best posture. When both feet are on the ground, the low back tends to lose its slight inward curve. If this curve flattens (or becomes too large), then the back and your other joints will experience too much stress, tire more quickly, and can cause pain.  PROLONGED ACTIVITY IN A FLEXED POSITION When completing a task that requires you to bend forward at your waist or lean over a low surface, find a way to stabilize 3 out of 4 of your limbs. You can place a hand or elbow on your thigh or rest a knee on the surface you are reaching across. This will provide you more stability so that your muscles do not tire as quickly. By keeping your knees relaxed, or slightly bent, you will also reduce stress across your low back. WALKING Walk with an upright posture. Your ears, shoulders, and hips should all line up. OFFICE WORK When working at a desk, create an environment that supports good, upright posture. Without extra  support, muscles tire, leading to too much strain on joints and other tissues.  CHAIR   A chair should be able to slide under your desk when your back makes contact with the back of the chair. This allows you to work closely.  The chair's height should allow your eyes to be level with the upper part of your monitor and your hands to be slightly lower than your elbows. BODY POSITION  Your feet should make contact with the floor. If this is not possible, use a footrest.  Keep your ears aligned over your shoulders. This will reduce stress on your neck and low back. Document Released: 06/16/2005 Document Revised: 10/31/2013 Document Reviewed: 09/28/2008 [facility] Patient Information 2015 [place], [place]. This information is not  intended to replace advice given to you by your health care provider. Make sure you discuss any questions you have with your health care provider.

## 2015-01-29 NOTE — Assessment & Plan Note (Signed)
Improvement with conservative therapy.

## 2015-01-29 NOTE — Assessment & Plan Note (Signed)
Patient is now having more signs and symptoms and a corresponding to more of a proximal injury in the ulnar nerve. Patient's ulnar nerve on ultrasound today was unremarkable. Patient is going to do more of a postural changes and given different home exercises and stretching. We discussed ergonomics a could be beneficial as well. We encouraged weight loss. Patient will continue to monitor. I do not feel that this is cervical radiculopathy at this time. Patient will come back and see me again in 3-4 weeks to see how she responding. No advance imaging warranted at this time.  Spent  25 minutes with patient face-to-face and had greater than 50% of counseling including as described above in assessment and plan.

## 2015-01-29 NOTE — Progress Notes (Signed)
[name] [address], [place] [address] Phone: [phone] Subjective:     CC: Right upper extremity pain follow-up  XTG:GYIRSWNIOE Autumn Massey is a 49 y.o. female coming in with complaint of right arm pain. Patient was seen and had more of a subluxation of the ulnar nerve. Patient states that the elbow pain as well as the form pain seems to be better. States that the weakness that she is having a seems to be better with the exercises that she is doing fairly regularly. Patient denies any worsening of the numbness. Patient still states though that it is continuing. Patient states that unfortunately when she reaches over her head for greater than 30 seconds her whole hand now is going numb. This is a little bit different at this time. Patient states that if she does that for a long amount of time she would even consider their weakness. Patient states though that the strength and filling comes back when she puts her hand down. If she tries to reach behind her back she can have very similar presentation as well. States that she wakes up in a abnormal position she can have the same problem.  Past Medical History  Diagnosis Date  . ANXIETY DEPRESSION 01/29/2009  . DEPRESSION 11/19/2007  . FREQUENCY, URINARY 10/17/2009  . HYPERLIPIDEMIA 11/19/2007  . HYPERTENSION 11/19/2007  . HYPOTHYROIDISM 11/19/2007  . PNEUMONIA 08/06/2010  . POLYCYTHEMIA 01/29/2009  . Preventative health care 04/06/2011  . SLEEP RELATED HYPOVENTILATION/HYPOXEMIA CCE 02/08/2009  . Morbid obesity   . Venous insufficiency 07/19/2012  . Sleep apnea     mild sleep apnea, on o2 at 2l at nighttime   Past Surgical History  Procedure Laterality Date  . Tonsillectomy    . Ulnar tunnel release Left 02/22/2013    Procedure: LEFT ULNAR NERVE DECOMPRESSION ;  Surgeon: Tennis Must, MD;  Location: [facility];  Service: Orthopedics;  Laterality: Left;   History  Substance Use Topics  .  Smoking status: Current Every Day Smoker -- 0.25 packs/day  . Smokeless tobacco: Never Used     Comment: started at age 78, less than 1 ppd.  . Alcohol Use: 0.0 oz/week    0 Standard drinks or equivalent per week     Comment: rare   No Known Allergies Family History  Problem Relation Age of Onset  . Cancer Mother     melanoma   . Cancer Maternal Grandfather     prostate cancer  . Alcohol abuse Other   . Arthritis Other   . Hypertension Other        Past medical history, social, surgical and family history all reviewed in electronic medical record.   Review of Systems: No headache, visual changes, nausea, vomiting, diarrhea, constipation, dizziness, abdominal pain, skin rash, fevers, chills, night sweats, weight loss, swollen lymph nodes, body aches, joint swelling, muscle aches, chest pain, shortness of breath, mood changes.   Objective Blood pressure 124/84, pulse 81, height 5\' 2"  (1.575 m), weight 333 lb (151.048 kg), SpO2 94 %.  General: No apparent distress alert and oriented x3 mood and affect normal, dressed appropriately.  HEENT: Pupils equal, extraocular movements intact  Respiratory: Patient's speak in full sentences and does not appear short of breath  Cardiovascular: No lower extremity edema, non tender, no erythema  Skin: Warm dry intact with no signs of infection or rash on extremities or on axial skeleton.  Abdomen: Soft nontender  Neuro: Cranial nerves II  through XII are intact, neurovascularly intact in all extremities with 2+ DTRs and 2+ pulses.  Lymph: No lymphadenopathy of posterior or anterior cervical chain or axillae bilaterally.  Gait normal with good balance and coordination.  MSK:  Non tender with full range of motion and good stability and symmetric strength and tone of shoulders,   hip, knee and ankles bilaterally.  Neck: Inspection unremarkable. No palpable stepoffs. Negative Spurling's maneuver. Full neck range of motion with very minimal loss of  range of motion secondary to patient's body habitus. Grip strength and sensation normal in bilateral hands Strength good C4 to T1 distribution No sensory change to C4 to T1 Negative Hoffman sign bilaterally Reflexes normal  Elbow: right  Unremarkable to inspection. Range of motion full pronation, supination, flexion, extension. Strength is full to all of the above directions Stable to varus, valgus stress. Negative moving valgus stress test. Negative signs for subluxation Positive cubital tunnel Tinel's still present. Minimally tender still in the same area. Or discomfort though when he shouldn't has more pressure on the anterior aspect of the chest wall. Positive apprehension sign.  Musculoskeletal ultrasound was performed and interpreted by Charlann Boxer D.O.   Elbow:  Lateral epicondyle and common extensor tendon origin visualized.  No edema, effusions, or avulsions seen.  Radial head unremarkable and located in annular ligament Medial epicondyle and common flexor tendon origin visualized.  No edema, effusions, or avulsions seen.  No enlargement of the ulnar nerve with no signs of subluxation. Appears normal today. Olecranon and triceps insertion visualized and unremarkable without edema, effusion, or avulsion.  No signs olecranon bursitis. Power doppler signal normal.  IMPRESSION:  No subluxation noted and no hypoechoic changes.     Impression and Recommendations:     This case required medical decision making of moderate complexity.

## 2015-02-21 ENCOUNTER — Telehealth: Payer: Self-pay | Admitting: Internal Medicine

## 2015-02-21 MED ORDER — LEVOTHYROXINE SODIUM 112 MCG PO TABS: ORAL_TABLET | ORAL | Status: DC

## 2015-02-21 NOTE — Addendum Note (Signed)
Addended by: Rockie Neighbours B on: 02/21/2015 09:59 AM   Modules accepted: Orders

## 2015-02-21 NOTE — Telephone Encounter (Signed)
[facility] note dated 02/20/15 at 7:24 PM Chief Complaint Prescription Refill or Medication Request (non symptomatic) Initial Comment Caller states out of Levothyroxine X4 days, pharm says they sent request but not there Nurse Assessment Nurse: Thad Ranger, RN, Langley Gauss Date/Time (Eastern Time): 02/20/2015 7:24:27 PM Please select the assessment type ---Refill Additional Documentation ---Pt states she needs a refill on Levothyroxine .163mcg 2 tabs po qd. Pharmacy would allow an emerg refill this eve, but told the pt she had to come in to the pharm to pick up the medication, but the states she can not go inside the pharm to get the medication this eve. States she has been out of the med x 4 days. Denies new/ worsening s/s or need for triage at this time. Does the patient have enough medication to last until the office opens? ---No Additional Documentation ---Offered to call in a refill on MM per profile, but the pt states she will just call the [facility] in the am during reg bus hrs for the entire Rx needed instead of getting 1-2 tabs at this time. Advised to call the MD nurse directly in the am for the needed refill instead of allowing the pharm to send request for refill needed. Advised to cb if further asst needed. Verb understanding.

## 2015-03-12 ENCOUNTER — Ambulatory Visit: Payer: 59 | Admitting: Family Medicine

## 2015-03-26 ENCOUNTER — Ambulatory Visit: Payer: 59 | Admitting: Neurology

## 2015-04-03 ENCOUNTER — Telehealth: Payer: Self-pay | Admitting: Neurology

## 2015-04-03 NOTE — Telephone Encounter (Signed)
Nancy/[employer] Rep/ Called about Med records Request for pt long term disability/ call back @ [phone]

## 2015-04-05 NOTE — Telephone Encounter (Signed)
Called Izora Gala back and requested for her to call me back or she can send a request for medical records via fax.  #'s given.

## 2015-04-20 ENCOUNTER — Other Ambulatory Visit: Payer: Self-pay | Admitting: Neurology

## 2015-04-20 ENCOUNTER — Other Ambulatory Visit: Payer: Self-pay | Admitting: Internal Medicine

## 2015-04-20 NOTE — Telephone Encounter (Signed)
Rx routed.

## 2015-05-28 ENCOUNTER — Ambulatory Visit: Payer: 59 | Admitting: Internal Medicine

## 2015-05-28 ENCOUNTER — Telehealth: Payer: Self-pay | Admitting: Internal Medicine

## 2015-05-28 NOTE — Telephone Encounter (Signed)
Patient would like to speak with Larene Beach regarding her hospital stay   Please advise   Thank you

## 2015-05-28 NOTE — Telephone Encounter (Signed)
Her last HbA1c was good. She may need Glipizide before meals, but I expect her sugars to decrease as the steroid is tapered... Try to limit portions until then (as sugars are highest after meals while on steroids), drink plenty of water, and let us know about the sugars again in 2 days. Needs an appt soon.

## 2015-05-28 NOTE — Telephone Encounter (Signed)
Pt had to cancel her appt today. She said she was in the hospital last week due to bronchial pneumonia and COPD. She was put on steroids. Her blood sugar spiked to 360's. They got it under control, the next day it was 151. She is on a steroid reduction pill. Her sugars in the AM are in the 160's, before meals 210-230 and around 200 at bedtime. She had an A1c in the hospital, they told her that it was good, but did not advise her of the value. She will reschedule her appt as she gets closer to finishing the steroid reduction (dose pack) pills. Pt was advised to contact us if her b/s continues to stay high or spikes. Be advised.

## 2015-05-29 ENCOUNTER — Telehealth: Payer: Self-pay | Admitting: Internal Medicine

## 2015-05-29 MED ORDER — GLUCOSE BLOOD VI STRP: ORAL_STRIP | Status: DC

## 2015-05-29 NOTE — Telephone Encounter (Signed)
Called pt and advised her per Dr Arman Filter message below. Pt voiced understanding. Pt will schedule an appt once she is able to. She is still feeling sickly.

## 2015-05-29 NOTE — Telephone Encounter (Signed)
Patient called stating that she would like a refill sent to her pharmacy   Rx: Romulus test strips   Autumn Massey   Please advise patient   Thank you

## 2015-05-29 NOTE — Telephone Encounter (Signed)
Called pt and advised her that we sent them. The pharmacy may not carry this brand since this is usually only sold at [facility]. Pt voiced understanding and will let us know.

## 2015-05-31 ENCOUNTER — Ambulatory Visit (INDEPENDENT_AMBULATORY_CARE_PROVIDER_SITE_OTHER): Payer: 59 | Admitting: Internal Medicine

## 2015-05-31 ENCOUNTER — Encounter: Payer: Self-pay | Admitting: Internal Medicine

## 2015-05-31 VITALS — BP 120/82 | HR 89 | Temp 98.0°F | Ht 62.0 in | Wt 332.0 lb

## 2015-05-31 DIAGNOSIS — J441 Chronic obstructive pulmonary disease with (acute) exacerbation: Secondary | ICD-10-CM

## 2015-05-31 DIAGNOSIS — N760 Acute vaginitis: Secondary | ICD-10-CM | POA: Diagnosis not present

## 2015-05-31 DIAGNOSIS — J9601 Acute respiratory failure with hypoxia: Secondary | ICD-10-CM

## 2015-05-31 DIAGNOSIS — E119 Type 2 diabetes mellitus without complications: Secondary | ICD-10-CM

## 2015-05-31 DIAGNOSIS — J9602 Acute respiratory failure with hypercapnia: Secondary | ICD-10-CM

## 2015-05-31 MED ORDER — KETOCONAZOLE 200 MG PO TABS: 200.0000 mg | ORAL_TABLET | Freq: Every day | ORAL | Status: DC

## 2015-05-31 NOTE — Assessment & Plan Note (Signed)
Mod to severe post antibx and steroid, For ketoconozole 200 qd x 7 days,  to f/u any worsening symptoms or concerns

## 2015-05-31 NOTE — Assessment & Plan Note (Signed)
No wheeze today, overall doing well,  SpO2 Readings from Last 3 Encounters:  05/31/15 95%  01/29/15 94%  12/19/14 95%   For cont'd Home o2 L continusous,spiriva and refer pulm as above

## 2015-05-31 NOTE — Progress Notes (Signed)
Pre visit review using our clinic review tool, if applicable. No additional management support is needed unless otherwise documented below in the visit note. 

## 2015-05-31 NOTE — Patient Instructions (Addendum)
Please take all new medication as prescribed - the ketoconozole  Please continue all other medications as before, including the Home o2 2L  Please have the pharmacy call with any other refills you may need.  You will be contacted regarding the referral for: pulmonary  Please keep your appointments with your specialists as you may have planned  Please return in 6 months, or sooner if needed

## 2015-05-31 NOTE — Assessment & Plan Note (Addendum)
Suspect improved and likely to improve a bit further back to baseline, to cont current meds, for pulm referral as well  Note:  Total time for pt hx, exam, review of record with pt in the room, determination of diagnoses and plan for further eval and tx is > 40 min, with over 50% spent in coordination and counseling of patient

## 2015-05-31 NOTE — Assessment & Plan Note (Signed)
Mild uncontrolled due to steroid tx, cont to monitor, should improve further with finishing steroid in 6 days,  to f/u any worsening symptoms or concerns

## 2015-05-31 NOTE — Progress Notes (Signed)
Subjective:    Patient ID: Autumn Massey, female    DOB: 03/25/1966, 49 y.o.   MRN: [id number]  HPI  Here to fu recent hospn [facility] with PNA, volume overload, copd exac, acute resp failure hypoxic/hypercarbic on bipap first 3 nights, discharged nov 27, home since then, on Home o2 continuous (has been on home o2 2L at night only prior); at home at rest on RA earlier today was 93%, drops to 88% at lowest with exertion; ha had readings as high as 97% in evening at rest.  Discharged on new spiriva.  Did have elevated blood sugars with steroid tx, had SSI while hosp, cbg's at home in mid 100's, plans to taper off steroid in 6 days.  Vision has been somewhat blurred and hard to focus, and taste and vision have been some blurry but maybe getting better.   Probiotic helped with some diarrhea post antibx, but now with severe vaginitis yeastlike as well. Past Medical History  Diagnosis Date  . ANXIETY DEPRESSION 01/29/2009  . DEPRESSION 11/19/2007  . FREQUENCY, URINARY 10/17/2009  . HYPERLIPIDEMIA 11/19/2007  . HYPERTENSION 11/19/2007  . HYPOTHYROIDISM 11/19/2007  . PNEUMONIA 08/06/2010  . POLYCYTHEMIA 01/29/2009  . Preventative health care 04/06/2011  . SLEEP RELATED HYPOVENTILATION/HYPOXEMIA CCE 02/08/2009  . Morbid obesity ([place])   . Venous insufficiency 07/19/2012  . Sleep apnea     mild sleep apnea, on o2 at 2l at nighttime   Past Surgical History  Procedure Laterality Date  . Tonsillectomy    . Ulnar tunnel release Left 02/22/2013    Procedure: LEFT ULNAR NERVE DECOMPRESSION ;  Surgeon: Tennis Must, MD;  Location: [place];  Service: Orthopedics;  Laterality: Left;    reports that she has been smoking.  She has never used smokeless tobacco. She reports that she drinks alcohol. She reports that she does not use illicit drugs. family history includes Alcohol abuse in her other; Arthritis in her other; Cancer in her maternal grandfather and mother; Hypertension in her other. No  Known Allergies Current Outpatient Prescriptions on File Prior to Visit  Medication Sig Dispense Refill  . albuterol (PROVENTIL HFA;VENTOLIN HFA) 108 (90 BASE) MCG/ACT inhaler Inhale 2 puffs into the lungs every 6 (six) hours as needed for wheezing or shortness of breath.    [name] aspirin EC 81 MG tablet Take 81 mg by mouth daily.    . clonazePAM (KLONOPIN) 0.5 MG tablet Take 1 tablet (0.5 mg total) by mouth 2 (two) times daily as needed for anxiety. 30 tablet 2  . FLUoxetine (PROZAC) 20 MG capsule take 1 capsule by mouth once daily 90 capsule 1  . furosemide (LASIX) 40 MG tablet TAKE ONE TABLET BY MOUTH ONCE DAILY 90 tablet 0  . glucose blood test strip Use to test blood sugar 1 time daily as instructed. 100 each 3  . hydrochlorothiazide (HYDRODIURIL) 25 MG tablet TAKE 1 TABLET BY MOUTH ONCE DAILY 90 tablet 1  . HYDROcodone-homatropine (HYCODAN) 5-1.5 MG/5ML syrup 1 tsp 4 times daily as needed for cough    . irbesartan (AVAPRO) 300 MG tablet TAKE ONE TABLET BY MOUTH AT BEDTIME 90 tablet 3  . ketoconazole (NIZORAL) 2 % cream Apply 1 application topically daily. 15 g 1  . labetalol (NORMODYNE) 200 MG tablet TAKE 1 TABLET BY MOUTH TWICE DAILY 180 tablet 3  . levofloxacin (LEVAQUIN) 750 MG tablet Take 750 mg by mouth daily.    [name] levothyroxine (SYNTHROID, LEVOTHROID) 112 MCG tablet TAKE 2 TABLETS (  [place]) BY MOUTH DAILY BEFORE BREAKFAST. 120 tablet 1  . magnesium gluconate (MAGONATE) 500 MG tablet Take 500 mg by mouth daily.    . Multiple Vitamin (MULTIVITAMIN) tablet Take 1 tablet by mouth daily.    . NON FORMULARY Oxygen 2L use as bedtime    . primidone (MYSOLINE) 50 MG tablet take 2 tablets by mouth at bedtime 60 tablet 2  . simvastatin (ZOCOR) 40 MG tablet TAKE 1 TABLET BY MOUTH AT BEDTIME 90 tablet 3  . spironolactone (ALDACTONE) 25 MG tablet TAKE 1 TABLET BY MOUTH TWICE A DAY 180 tablet 1   No current facility-administered medications on file prior to visit.   Review of Systems   Constitutional: Negative for unusual diaphoresis or night sweats HENT: Negative for ringing in ear or discharge Eyes: Negative for double vision or worsening visual disturbance.  Respiratory: Negative for choking and stridor.   Gastrointestinal: Negative for vomiting or other signifcant bowel change Genitourinary: Negative for hematuria or change in urine volume.  Musculoskeletal: Negative for other MSK pain or swelling Skin: Negative for color change and worsening wound.  Neurological: Negative for tremors and numbness other than noted  Psychiatric/Behavioral: Negative for decreased concentration or agitation other than above       Objective:   Physical Exam BP 120/82 mmHg  Pulse 89  Temp(Src) 98 F (36.7 C) (Oral)  Ht 5\' 2"  (1.575 m)  Wt 332 lb (150.594 kg)  BMI 60.71 kg/m2  SpO2 95% VS noted, morbid obese Constitutional: Pt appears in no significant distress HENT: Head: NCAT.  Right Ear: External ear normal.  Left Ear: External ear normal.  Eyes: . Pupils are equal, round, and reactive to light. Conjunctivae and EOM are normal Neck: Normal range of motion. Neck supple.  Cardiovascular: Normal rate and regular rhythm.   Pulmonary/Chest: Effort normal and breath sounds decresaed without rales or wheezing.  Abd:  Soft, NT, ND, + BS Neurological: Pt is alert. Not confused , motor grossly intact Skin: Skin is warm. No rash, no LE edema Psychiatric: Pt behavior is normal. No agitation.      Assessment & Plan:

## 2015-06-27 ENCOUNTER — Telehealth: Payer: Self-pay | Admitting: *Deleted

## 2015-06-27 MED ORDER — SPIRIVA HANDIHALER 18 MCG IN CAPS: ORAL_CAPSULE | RESPIRATORY_TRACT | Status: DC

## 2015-06-27 NOTE — Telephone Encounter (Signed)
Left msg on triage stating needing refill on pt Spiriva. Sending electronically...Johny Chess

## 2015-07-04 ENCOUNTER — Other Ambulatory Visit: Payer: Self-pay | Admitting: Internal Medicine

## 2015-07-09 ENCOUNTER — Institutional Professional Consult (permissible substitution): Payer: 59 | Admitting: Internal Medicine

## 2015-07-09 ENCOUNTER — Ambulatory Visit: Payer: 59 | Admitting: Internal Medicine

## 2015-07-10 ENCOUNTER — Other Ambulatory Visit: Payer: Self-pay | Admitting: Internal Medicine

## 2015-07-12 ENCOUNTER — Other Ambulatory Visit: Payer: Self-pay | Admitting: Neurology

## 2015-07-12 ENCOUNTER — Other Ambulatory Visit: Payer: Self-pay | Admitting: Internal Medicine

## 2015-07-12 NOTE — Telephone Encounter (Signed)
Last OV: 11/29/14 Next OV: 07/23/15

## 2015-07-18 ENCOUNTER — Encounter: Payer: Self-pay | Admitting: Internal Medicine

## 2015-07-18 ENCOUNTER — Ambulatory Visit (INDEPENDENT_AMBULATORY_CARE_PROVIDER_SITE_OTHER): Payer: BLUE CROSS/BLUE SHIELD | Admitting: Internal Medicine

## 2015-07-18 VITALS — BP 112/70 | HR 81 | Ht 62.0 in | Wt 349.6 lb

## 2015-07-18 DIAGNOSIS — J9611 Chronic respiratory failure with hypoxia: Secondary | ICD-10-CM

## 2015-07-18 DIAGNOSIS — J449 Chronic obstructive pulmonary disease, unspecified: Secondary | ICD-10-CM

## 2015-07-18 DIAGNOSIS — J9612 Chronic respiratory failure with hypercapnia: Secondary | ICD-10-CM | POA: Diagnosis not present

## 2015-07-18 MED ORDER — NEBIVOLOL HCL 10 MG PO TABS: 10.0000 mg | ORAL_TABLET | Freq: Every day | ORAL | Status: DC

## 2015-07-18 MED ORDER — TIOTROPIUM BROMIDE-OLODATEROL 2.5-2.5 MCG/ACT IN AERS: 2.0000 | INHALATION_SPRAY | Freq: Every day | RESPIRATORY_TRACT | Status: DC

## 2015-07-18 NOTE — Progress Notes (Signed)
Subjective:     Patient ID: Autumn Massey, female   DOB: 08-28-1965,    MRN: [id number]  HPI   22 yowf  With morbid obesity Quit smoking 05/2015 on admit p several months worsening sob  > ranolph x 5 days > dx copd/ fluid overload and started on spiriva / pred/abx lowest wt 326 but  Started  worse again 1st of Jan 2017 with wt gain / sob s cough  So referred  07/18/2015 by Dr Jenny Reichmann to pulmonary clinic.     07/18/2015 1st [facility] office visit/ Havier Deeb  maint rx spiriva  Chief Complaint  Patient presents with  . Pulmonary Consult    Referred by Dr. [name]. Pt c/o SOB since Nov 2016. She states that she was dxed with PNA at this time and then told that she had COPD. She gets SOB walking across a parking lot or doing housework.    has 02 but only uses at hs 2lpm  X years  Presently doe =  MMRC3 = can't walk 100 yards even at a slow pace at a flat grade s stopping due to sob   Onset insidious/ pattern gradually worse assoc with leg swelling and wt loss but s excess/ purulent sputum or mucus plugs Breaths better p saba ? spiriva helping   No obvious day to day or daytime variability or assoc cp or chest tightness, subjective wheeze or overt sinus or hb symptoms. No unusual exp hx or h/o childhood pna/ asthma or knowledge of premature birth.  Sleeping ok without nocturnal  or early am exacerbation  of respiratory  c/o's or need for noct saba. Also denies any obvious fluctuation of symptoms with weather or environmental changes or other aggravating or alleviating factors except as outlined above   Current Medications, Allergies, Complete Past Medical History, Past Surgical History, Family History, and Social History were reviewed in [employer] record.  ROS  The following are not active complaints unless bolded sore throat, dysphagia, dental problems, itching, sneezing,  nasal congestion or excess/ purulent secretions, ear ache,   fever, chills, sweats, unintended wt  loss, classically pleuritic or exertional cp, hemoptysis,  orthopnea pnd or leg swelling, presyncope, palpitations, abdominal pain, anorexia, nausea, vomiting, diarrhea  or change in bowel or bladder habits, change in stools or urine, dysuria,hematuria,  rash, arthralgias, visual complaints, headache, numbness, weakness or ataxia or problems with walking or coordination,  change in mood/affect or memory.       Review of Systems     Objective:   Physical Exam Massively obese wf nad   Wt Readings from Last 3 Encounters:  07/18/15 349 lb 9.6 oz (158.578 kg)  05/31/15 332 lb (150.594 kg)  01/29/15 333 lb (151.048 kg)    Vital signs reviewed    HEENT: nl dentition, turbinates, and oropharynx. Nl external ear canals without cough reflex   NECK :  without JVD/Nodes/TM/ nl carotid upstrokes bilaterally   LUNGS: no acc muscle use,  Nl contour chest which is clear to A and P bilaterally without cough on insp or exp maneuvers   CV:  RRR  no s3 or murmur or increase in P2, trace edema/ severe chronic venous stasis changes both LE's   ABD:  soft and nontender with nl inspiratory excursion in the supine position. No bruits or organomegaly, bowel sounds nl  MS:  Nl gait/ ext warm without deformities, calf tenderness, cyanosis or clubbing No obvious joint restrictions   SKIN: warm and dry without  lesions    NEURO:  alert, approp, nl sensorium with  no motor deficits          Assessment:

## 2015-07-18 NOTE — Patient Instructions (Addendum)
Stop spiriva and start stiolto 2 puffs each am and fill the prescription if you like it, if not resume spiriva handihaler   Only use your albuterol as a rescue medication to be used if you can't catch your breath by resting or doing a relaxed purse lip breathing pattern.  - The less you use it, the better it will work when you need it. - Ok to use up to 2 puffs  every 4 hours if you must but call for immediate appointment if use goes up over your usual need - Don't leave home without it !!  (think of it like the spare tire for your car)   Stop normodyne   Start bystolic 10 mg daily in place of normodyne   Wear your 02 as much as you can  Please schedule a follow up office visit in 6 weeks, call sooner if needed with pfts on return

## 2015-07-19 DIAGNOSIS — J9611 Chronic respiratory failure with hypoxia: Secondary | ICD-10-CM | POA: Insufficient documentation

## 2015-07-19 DIAGNOSIS — J9612 Chronic respiratory failure with hypercapnia: Secondary | ICD-10-CM

## 2015-07-19 NOTE — Assessment & Plan Note (Addendum)
PFT's  05/22/10   FEV1 1.53 (62 % ) ratio 86  p no % improvement from saba with DLCO  87 % and ERV 40% at wt 209  Quit smoking 05/2015    Absence of cough is against aecopd and if she wheezes with flares need to also consider cardiac asthma in ddx but for now pending f/u pfts best option is trial of stiolto since she's not better on spiriva and says saba helps some  - The proper method of use, as well as anticipated side effects, of a metered-dose inhaler are discussed and demonstrated to the patient. Improved effectiveness after extensive coaching during this visit to a level of approximately 75 % from a baseline of 50 %   Unfortunately her main problem appears to be restrictive/obesity related  noting she was restrictive at wt 209 and has gained another 100 lbs since then   I had an extended discussion with the patient reviewing all relevant studies completed to date and  lasting 9minutes of a 60 minute visit    Each maintenance medication was reviewed in detail including most importantly the difference between maintenance and prns and under what circumstances the prns are to be triggered using an action plan format that is not reflected in the computer generated alphabetically organized AVS.    Please see instructions for details which were reviewed in writing and the patient given a copy highlighting the part that I personally wrote and discussed at today's ov.

## 2015-07-19 NOTE — Assessment & Plan Note (Signed)
Body mass index is 63.93 kg/(m^2).  Lab Results  Component Value Date   TSH 1.56 11/22/2014     Contributing to gerd tendency/ doe/reviewed the need and the process to achieve and maintain neg calorie balance > defer f/u primary care including intermittently monitoring thyroid status

## 2015-07-19 NOTE — Assessment & Plan Note (Signed)
Most likely she has ohs with bicarb levels in 30s in our records but no sign osa (albeit at lower wt) remotely by PSG/Clance  She may eventually require chronic bipap rx at hs but for now rec 02 2lpm at hs and as much as she can wear it during the day watching for HA or ams suggesting worsening hypercarbia and noting she already has element of diastolic dysfunction by echo at [facility] but not clear cor pulmonale yet.

## 2015-07-23 ENCOUNTER — Other Ambulatory Visit (INDEPENDENT_AMBULATORY_CARE_PROVIDER_SITE_OTHER): Payer: BLUE CROSS/BLUE SHIELD

## 2015-07-23 ENCOUNTER — Encounter: Payer: Self-pay | Admitting: Neurology

## 2015-07-23 ENCOUNTER — Ambulatory Visit (INDEPENDENT_AMBULATORY_CARE_PROVIDER_SITE_OTHER): Payer: BLUE CROSS/BLUE SHIELD | Admitting: Neurology

## 2015-07-23 VITALS — BP 124/76 | HR 73 | Ht 62.0 in | Wt 352.0 lb

## 2015-07-23 DIAGNOSIS — G5621 Lesion of ulnar nerve, right upper limb: Secondary | ICD-10-CM

## 2015-07-23 DIAGNOSIS — G250 Essential tremor: Secondary | ICD-10-CM

## 2015-07-23 DIAGNOSIS — E119 Type 2 diabetes mellitus without complications: Secondary | ICD-10-CM | POA: Diagnosis not present

## 2015-07-23 DIAGNOSIS — E039 Hypothyroidism, unspecified: Secondary | ICD-10-CM

## 2015-07-23 LAB — COMPLETE METABOLIC PANEL WITH GFR
ALT: 21 U/L (ref 6–29)
AST: 17 U/L (ref 10–35)
Albumin: 3.8 g/dL (ref 3.6–5.1)
Alkaline Phosphatase: 43 U/L (ref 33–115)
BUN: 32 mg/dL — AB (ref 7–25)
CALCIUM: 9.4 mg/dL (ref 8.6–10.2)
CHLORIDE: 99 mmol/L (ref 98–110)
CO2: 30 mmol/L (ref 20–31)
CREATININE: 0.8 mg/dL (ref 0.50–1.10)
GFR, Est African American: >89 mL/min (ref >=60)
GFR, Est Non African American: 87 mL/min (ref >=60)
Glucose, Bld: 93 mg/dL (ref 65–99)
POTASSIUM: 5 mmol/L (ref 3.5–5.3)
Sodium: 141 mmol/L (ref 135–146)
Total Bilirubin: 0.5 mg/dL (ref 0.2–1.2)
Total Protein: 6 g/dL — ABNORMAL LOW (ref 6.1–8.1)

## 2015-07-23 LAB — TSH: TSH: 2.25 u[IU]/mL (ref 0.35–4.50)

## 2015-07-23 LAB — T4, FREE: Free T4: 1.08 ng/dL (ref 0.60–1.60)

## 2015-07-23 LAB — HEMOGLOBIN A1C: HEMOGLOBIN A1C: 7.7 % — AB (ref 4.6–6.5)

## 2015-07-23 MED ORDER — PRIMIDONE 50 MG PO TABS: 125.0000 mg | ORAL_TABLET | Freq: Every day | ORAL | Status: DC

## 2015-07-23 NOTE — Patient Instructions (Signed)
Increase primidone to 125mg  at bedtime (take 2 and 1/2 tablets at bedtime).  Call in 4 weeks with update and we can increase dose if needed. Follow up in 5 months.

## 2015-07-23 NOTE — Progress Notes (Signed)
NEUROLOGY FOLLOW UP OFFICE NOTE  Autumn Massey [id number]  HISTORY OF PRESENT ILLNESS: Autumn Massey is a 50 year old right-handed woman with hypothyroidism, hyperlipidemia, impaired glucose tolerance, hypertension, hypoventilation, cerebrovascular disease and former smoker who follows up for left ulnar neuropathy and essential tremor.  UPDATE: She was referred to Dr. Tamala Julian at [place].  Ultrasound of ulnar nerve was unremarkable.  She underwent conservative therapy for ulnar neuropathy with improvement.  However, she began noticing that her whole hand would go numb when she would reach for something above her head.  Thoracic outlet syndrome was suspected.  Home exercises have helped.  For tremor, she is taking primidone 100mg  at bedtime.  She still notes trouble with writing and holding utensils.    She is on O2 but forgot to bring it up with her from her car.  HISTORY: She underwent left ulnar decompression surgery in August 2014.  When the cast was removed the following month, she noted twitching of her 5th digit.Autumn Massey  An MRI of the cervical spine was performed in December 2014, which revealed no structural etiology for radiculopathy.  She was evaluated by Dr. Catalina Gravel, a neurologist in [place], who performed a [place] in January 2015.  It was essentially a normal exam.  She continues to have numbness, paresthesias and weakness of the 4th and 5th digits of the left hand.  She later developed tremor in both hands, more noticeable in the right hand since she is right-hand dominant.  It occurs when she is writing or holding utensils.  She denies gait difficulty, rigidity, lack of sense of smell, or history consistent with REM sleep behavior.  She is concerned about Parkinson's disease because her maternal grandfather and uncle had it.  Another uncle has tremor.  She also notes numbness and tingling involving the entire right hand.  It is noticeable when she holds a phone or when she is  driving any significant distance.  She is able to shake it out.  She feels the numbness is worse when her elbow is bent and it causes shooting pain from the elbow up her forearm.  She does not wake up with it.  She tried Lyrica, which she could not tolerate.  She was then seen by neurology at [facility] for the tremor.  She was started on gabapentin.  She could not tolerate it.   She had a NCV-EMG of the right upper extremity on 10/30/14, which showed a non-localizable right ulnar neuropathy.  There was no evidence for carpal tunnel syndrome or cervical radiculopathy.  An ultrasound of the right ulnar nerve was performed, in order to see if there was sign of compression, but it was normal.  Because of headache, Dr. Catalina Gravel also ordered an MRI of the brain was performed in February 2015, which showed remote punctate infarct.  She is taking ASA 81mg  daily  PAST MEDICAL HISTORY: Past Medical History  Diagnosis Date  . ANXIETY DEPRESSION 01/29/2009  . DEPRESSION 11/19/2007  . FREQUENCY, URINARY 10/17/2009  . HYPERLIPIDEMIA 11/19/2007  . HYPERTENSION 11/19/2007  . HYPOTHYROIDISM 11/19/2007  . PNEUMONIA 08/06/2010  . POLYCYTHEMIA 01/29/2009  . Preventative health care 04/06/2011  . SLEEP RELATED HYPOVENTILATION/HYPOXEMIA CCE 02/08/2009  . Morbid obesity ([place])   . Venous insufficiency 07/19/2012  . Sleep apnea     mild sleep apnea, on o2 at 2l at nighttime    MEDICATIONS: Current Outpatient Prescriptions on File Prior to Visit  Medication Sig Dispense Refill  . albuterol (PROVENTIL HFA;VENTOLIN HFA) 108 (90  BASE) MCG/ACT inhaler Inhale 2 puffs into the lungs every 6 (six) hours as needed for wheezing or shortness of breath.    Autumn Massey aspirin EC 81 MG tablet Take 81 mg by mouth daily.    . clonazePAM (KLONOPIN) 0.5 MG tablet Take 1 tablet (0.5 mg total) by mouth 2 (two) times daily as needed for anxiety. 30 tablet 2  . FLUoxetine (PROZAC) 20 MG capsule take 1 capsule by mouth once daily 90 capsule 1  . furosemide  (LASIX) 40 MG tablet TAKE ONE TABLET BY MOUTH EVERY DAY 30 tablet 2  . glucose blood test strip Use to test blood sugar 1 time daily as instructed. 100 each 3  . hydrochlorothiazide (HYDRODIURIL) 25 MG tablet TAKE ONE TABLET BY MOUTH EVERY DAY 30 tablet 5  . irbesartan (AVAPRO) 300 MG tablet TAKE ONE TABLET BY MOUTH AT BEDTIME 90 tablet 3  . levothyroxine (SYNTHROID, LEVOTHROID) 112 MCG tablet TAKE TWO tablets BY MOUTH EVERY MORNING BEFORE breakfast 60 tablet 1  . Multiple Vitamin (MULTIVITAMIN) tablet Take 1 tablet by mouth daily.    . nebivolol (BYSTOLIC) 10 MG tablet Take 1 tablet (10 mg total) by mouth daily.    . NON FORMULARY Oxygen 2L use as bedtime    . simvastatin (ZOCOR) 40 MG tablet TAKE 1 TABLET BY MOUTH AT BEDTIME 90 tablet 3  . spironolactone (ALDACTONE) 25 MG tablet TAKE ONE TABLET BY MOUTH TWICE DAILY 60 tablet 2  . Tiotropium Bromide-Olodaterol (STIOLTO RESPIMAT) 2.5-2.5 MCG/ACT AERS Inhale 2 puffs into the lungs daily. 1 Inhaler 11   No current facility-administered medications on file prior to visit.    ALLERGIES: No Known Allergies  FAMILY HISTORY: Family History  Problem Relation Age of Onset  . Cancer Mother     melanoma   . Cancer Maternal Grandfather     prostate cancer  . Alcohol abuse Other   . Arthritis Other   . Hypertension Other     SOCIAL HISTORY: Social History   Social History  . Marital Status: Divorced    Spouse Name: N/A  . Number of Children: 0  . Years of Education: 12   Occupational History  . Disabled    Social History Main Topics  . Smoking status: Former Smoker -- 0.50 packs/day for 22 years    Types: Cigarettes    Quit date: 05/22/2015  . Smokeless tobacco: Never Used  . Alcohol Use: 0.0 oz/week    0 Standard drinks or equivalent per week     Comment: rare  . Drug Use: No  . Sexual Activity: No   Other Topics Concern  . Not on file   Social History Narrative    REVIEW OF SYSTEMS: Constitutional: No fevers, chills,  or sweats, no generalized fatigue, change in appetite Eyes: No visual changes, double vision, eye pain Ear, nose and throat: No hearing loss, ear pain, nasal congestion, sore throat Cardiovascular: No chest pain, palpitations Respiratory:  No shortness of breath at rest or with exertion, wheezes GastrointestinaI: No nausea, vomiting, diarrhea, abdominal pain, fecal incontinence Genitourinary:  No dysuria, urinary retention or frequency Musculoskeletal:  No neck pain, back pain Integumentary: No rash, pruritus, skin lesions Neurological: as above Psychiatric: No depression, insomnia, anxiety Endocrine: No palpitations, fatigue, diaphoresis, mood swings, change in appetite, change in weight, increased thirst Hematologic/Lymphatic:  No anemia, purpura, petechiae. Allergic/Immunologic: no itchy/runny eyes, nasal congestion, recent allergic reactions, rashes  PHYSICAL EXAM: Filed Vitals:   07/23/15 1434  BP: 124/76  Pulse: 73  General: No acute distress.  Patient appears well-groomed.  Morbidly obese Head:  Normocephalic/atraumatic Eyes:  Fundoscopic exam unremarkable without vessel changes, exudates, hemorrhages or papilledema. Neck: supple, no paraspinal tenderness, full range of motion Heart:  Regular rate and rhythm Lungs:  Clear to auscultation bilaterally Back: No paraspinal tenderness Neurological Exam: alert and oriented to person, place, and time. Attention span and concentration intact, recent and remote memory intact, fund of knowledge intact.  Speech fluent and not dysarthric, language intact.  CN II-XII intact. Fundoscopic exam unremarkable without vessel changes, exudates, hemorrhages or papilledema.  Bulk and tone normal, muscle strength 5/5 throughout.  Sensation to light touch, temperature and vibration intact.  Deep tendon reflexes 2+ throughout, toes downgoing.  Finger to nose and heel to shin testing intact.  Gait normal.  IMPRESSION: Right ulnar neuropathy with hand  numbness.  Hand numbness is non-localizable.  However, she notes radiating shooting pain from the elbow up the forearm, so it is likely playing a role. Essential tremor Cerebrovascular disease Morbid obesity  PLAN: 1.  Increase primidone to 125mg  at bedtime.  She will call in 1 month with update. 2.  ASA 81mg  daily for secondary stroke prevention 3.  Weight loss 4.  Follow up in 5 months.  15 minutes spent face to face with patient, over 50% spent discussing management.  Metta Clines, DO  CC:  [name], MD

## 2015-07-25 ENCOUNTER — Ambulatory Visit: Payer: BLUE CROSS/BLUE SHIELD | Admitting: Internal Medicine

## 2015-08-31 ENCOUNTER — Other Ambulatory Visit: Payer: Self-pay | Admitting: Internal Medicine

## 2015-08-31 DIAGNOSIS — J449 Chronic obstructive pulmonary disease, unspecified: Secondary | ICD-10-CM

## 2015-09-03 ENCOUNTER — Telehealth: Payer: Self-pay | Admitting: Internal Medicine

## 2015-09-03 ENCOUNTER — Ambulatory Visit (INDEPENDENT_AMBULATORY_CARE_PROVIDER_SITE_OTHER): Payer: BLUE CROSS/BLUE SHIELD | Admitting: Internal Medicine

## 2015-09-03 ENCOUNTER — Encounter: Payer: Self-pay | Admitting: Internal Medicine

## 2015-09-03 VITALS — BP 102/70 | HR 82 | Ht 62.0 in | Wt 353.4 lb

## 2015-09-03 DIAGNOSIS — J449 Chronic obstructive pulmonary disease, unspecified: Secondary | ICD-10-CM

## 2015-09-03 DIAGNOSIS — J9612 Chronic respiratory failure with hypercapnia: Secondary | ICD-10-CM | POA: Diagnosis not present

## 2015-09-03 DIAGNOSIS — J9611 Chronic respiratory failure with hypoxia: Secondary | ICD-10-CM

## 2015-09-03 LAB — PULMONARY FUNCTION TEST
DL/VA % pred: 133 %
DL/VA: 6.08 ml/min/mmHg/L
DLCO COR % PRED: 72 %
DLCO COR: 15.71 ml/min/mmHg
DLCO UNC: 15.9 ml/min/mmHg
DLCO unc % pred: 73 %
FEF 25-75 POST: 2.81 L/s
FEF 25-75 Pre: 2.35 L/sec
FEF2575-%Change-Post: 19 %
FEF2575-%PRED-PRE: 88 %
FEF2575-%Pred-Post: 105 %
FEV1-%Change-Post: 4 %
FEV1-%PRED-POST: 55 %
FEV1-%Pred-Pre: 53 %
FEV1-Post: 1.46 L
FEV1-Pre: 1.4 L
FEV1FVC-%Change-Post: 0 %
FEV1FVC-%Pred-Pre: 115 %
FEV6-%CHANGE-POST: 5 %
FEV6-%Pred-Post: 49 %
FEV6-%Pred-Pre: 47 %
FEV6-Post: 1.6 L
FEV6-Pre: 1.52 L
FEV6FVC-%Pred-Post: 102 %
FEV6FVC-%Pred-Pre: 102 %
FVC-%CHANGE-POST: 5 %
FVC-%PRED-POST: 48 %
FVC-%Pred-Pre: 45 %
FVC-Post: 1.6 L
FVC-Pre: 1.52 L
POST FEV6/FVC RATIO: 100 %
PRE FEV1/FVC RATIO: 92 %
Post FEV1/FVC ratio: 91 %
Pre FEV6/FVC Ratio: 100 %
RV % pred: 60 %
RV: 1.01 L
TLC % PRED: 56 %
TLC: 2.7 L

## 2015-09-03 MED ORDER — BUDESONIDE-FORMOTEROL FUMARATE 80-4.5 MCG/ACT IN AERO: INHALATION_SPRAY | RESPIRATORY_TRACT | Status: DC

## 2015-09-03 NOTE — Patient Instructions (Addendum)
Finish up your stiolto and then start  symbicort 80 Take 2 puffs first thing in am and then another 2 puffs about 12 hours later.   Only use your albuterol(proair)  as a rescue medication to be used if you can't catch your breath by resting or doing a relaxed purse lip breathing pattern.  - The less you use it, the better it will work when you need it. - Ok to use up to 2 puffs  every 4 hours if you must but call for immediate appointment if use goes up over your usual need - Don't leave home without it !!  (think of it like the spare tire for your car)   Please schedule a follow up office visit in 6 weeks, call sooner if needed

## 2015-09-03 NOTE — Assessment & Plan Note (Signed)
ERV 12% by pfts 09/03/2015   Body mass index is 64.62 > still trending up   Lab Results  Component Value Date   TSH 2.25 07/23/2015     Contributing to gerd tendency/ doe/reviewed the need and the process to achieve and maintain neg calorie balance > defer f/u primary care including intermittently monitoring thyroid status

## 2015-09-03 NOTE — Assessment & Plan Note (Signed)
HCO3  07/23/15  = 30 c/w only mild hypercarbia  02 2lpm hs and with exertion as of 09/03/2015

## 2015-09-03 NOTE — Progress Notes (Signed)
PFT done today. 

## 2015-09-03 NOTE — Progress Notes (Signed)
Subjective:     Patient ID: Autumn Massey, female   DOB: 12/09/1965    MRN: [id number]     Brief patient profile:  47 yowf  With morbid obesity Quit smoking 05/2015 on admit p several months worsening sob  > ranolph x 5 days > dx copd/ fluid overload and started on spiriva / pred/abx lowest wt 326 but  Started  worse again 1st of Jan 2017 with wt gain / sob s cough  So referred  07/18/2015 by Dr Jenny Reichmann to pulmonary clinic.      History of Present Illness  07/18/2015 1st [facility] office visit/ Autumn Massey  maint rx spiriva  Chief Complaint  Patient presents with  . Pulmonary Consult    Referred by Dr. [name]. Pt c/o SOB since Nov 2016. She states that she was dxed with PNA at this time and then told that she had COPD. She gets SOB walking across a parking lot or doing housework.    has 02 but only uses at hs 2lpm  X years  Presently doe =  MMRC3 = can't walk 100 yards even at a slow pace at a flat grade s stopping due to sob   Onset insidious/ pattern gradually worse assoc with leg swelling and wt loss but s excess/ purulent sputum or mucus plugs Breaths better p saba ? spiriva helping  rec Stop spiriva and start stiolto 2 puffs each am and fill the prescription if you like it, if not resume spiriva handihaler  Only use your albuterol   Stop normodyne  Start bystolic 10 mg daily in place of normodyne  Wear your 02 as much as you can    09/03/2015  f/u ov/Autumn Massey re: obesity/ maint rx = stiolto 2 each am / hbp  Chief Complaint  Patient presents with  . Follow-up    Using 2L O2 at bedtime and while doing a lot of walking and also during the day while at home. Mild wheezing first thing in the morning improves after using inhaler. Denies any coughing, cp/tightness. Pt was having some bladder leakage before but has noticed improvement since starting 02.  resting ok off 02 / sleeping on 02 and leaning on cart on 02 HT / can't do WM yet but trend is improving    No obvious day to day or  daytime variability or assoc excess/ purulent sputum or mucus plugs   cp or chest tightness, subjective wheeze or overt sinus or hb symptoms. No unusual exp hx or h/o childhood pna/ asthma or knowledge of premature birth.  Sleeping ok without nocturnal  or early am exacerbation  of respiratory  c/o's or need for noct saba. Also denies any obvious fluctuation of symptoms with weather or environmental changes or other aggravating or alleviating factors except as outlined above   Current Medications, Allergies, Complete Past Medical History, Past Surgical History, Family History, and Social History were reviewed in [employer] record.  ROS  The following are not active complaints unless bolded sore throat, dysphagia, dental problems, itching, sneezing,  nasal congestion or excess/ purulent secretions, ear ache,   fever, chills, sweats, unintended wt loss, classically pleuritic or exertional cp, hemoptysis,  orthopnea pnd or leg swelling, presyncope, palpitations, abdominal pain, anorexia, nausea, vomiting, diarrhea  or change in bowel or bladder habits, change in stools or urine, dysuria,hematuria,  rash, arthralgias, visual complaints, headache, numbness, weakness or ataxia or problems with walking or coordination,  change in mood/affect or memory.  Objective:   Physical Exam   Massively obese wf nad   09/03/2015          353   07/18/15 349 lb 9.6 oz (158.578 kg)  05/31/15 332 lb (150.594 kg)  01/29/15 333 lb (151.048 kg)    Vital signs reviewed    HEENT: nl dentition, turbinates, and oropharynx. Nl external ear canals without cough reflex   NECK :  without JVD/Nodes/TM/ nl carotid upstrokes bilaterally   LUNGS: no acc muscle use,  Nl contour chest which is clear to A and P bilaterally without cough on insp or exp maneuvers   CV:  RRR  no s3 or murmur or increase in P2, trace edema/ severe chronic venous stasis changes both LE's   ABD:  soft and  nontender with nl inspiratory excursion in the supine position. No bruits or organomegaly, bowel sounds nl  MS:  Nl gait/ ext warm without deformities, calf tenderness, cyanosis or clubbing No obvious joint restrictions   SKIN: warm and dry without lesions    NEURO:  alert, approp, nl sensorium with  no motor deficits          Assessment:

## 2015-09-03 NOTE — Telephone Encounter (Signed)
Pt already being seen for appt. Will sign off message

## 2015-09-03 NOTE — Assessment & Plan Note (Signed)
-   PFT's  05/22/10   FEV1 1.53 (62 % ) ratio 86  p no % improvement from saba with DLCO  87 % and ERV 40% at wt 209  - Quit smoking 05/2015 - 07/18/2015    try stiolto - PFT's s obst 09/03/2015 with ERV 12 p am stiolto so rec change to symbicort 80 2bid - 09/03/2015  extensive coaching HFA effectiveness =    75% (short Ti)   Although she has no copd, her am wheeze hx does suggest asthma (vs noct gerd related to obesity) so for now rec simplify rx with symbicort 80 2bid trial   I had an extended discussion with the patient reviewing all relevant studies completed to date and  lasting 15 to 20 minutes of a 25 minute visit    Each maintenance medication was reviewed in detail including most importantly the difference between maintenance and prns and under what circumstances the prns are to be triggered using an action plan format that is not reflected in the computer generated alphabetically organized AVS.    Please see instructions for details which were reviewed in writing and the patient given a copy highlighting the part that I personally wrote and discussed at today's ov.

## 2015-09-07 ENCOUNTER — Ambulatory Visit: Payer: BLUE CROSS/BLUE SHIELD | Admitting: Internal Medicine

## 2015-09-13 ENCOUNTER — Other Ambulatory Visit: Payer: Self-pay | Admitting: Internal Medicine

## 2015-09-26 ENCOUNTER — Telehealth: Payer: Self-pay | Admitting: Internal Medicine

## 2015-09-26 NOTE — Telephone Encounter (Signed)
Per 09/03/15 OV: Patient Instructions       Finish up your stiolto and then start  symbicort 80 Take 2 puffs first thing in am and then another 2 puffs about 12 hours later.  Only use your albuterol(proair)  as a rescue medication to be used if you can't catch your breath by resting or doing a relaxed purse lip breathing pattern.   - The less you use it, the better it will work when you need it. - Ok to use up to 2 puffs  every 4 hours if you must but call for immediate appointment if use goes up over your usual need - Don't leave home without it !!  (think of it like the spare tire for your car)  Please schedule a follow up office visit in 6 weeks, call sooner if needed    ---  ATC pt and she could not hear me d/t cell reception. WCB

## 2015-09-27 MED ORDER — NEBIVOLOL HCL 10 MG PO TABS: 10.0000 mg | ORAL_TABLET | Freq: Every day | ORAL | Status: DC

## 2015-09-27 NOTE — Telephone Encounter (Signed)
Called spoke with pt. Aware of recs. RX has been sent in. Nothing further needed

## 2015-09-27 NOTE — Telephone Encounter (Signed)
lmtcb x1 for pt. 

## 2015-09-27 NOTE — Telephone Encounter (Signed)
Pt returning call.Autumn Massey ° °

## 2015-09-28 ENCOUNTER — Telehealth: Payer: Self-pay | Admitting: Internal Medicine

## 2015-09-28 NOTE — Telephone Encounter (Signed)
PA for Bystolic initiated through [place]: [place]  If no response in 3 business days, call [employer] at [phone]

## 2015-10-01 ENCOUNTER — Telehealth: Payer: Self-pay | Admitting: Neurology

## 2015-10-01 MED ORDER — PREGABALIN 75 MG PO CAPS: 75.0000 mg | ORAL_CAPSULE | Freq: Two times a day (BID) | ORAL | Status: DC

## 2015-10-01 NOTE — Telephone Encounter (Signed)
The primidone (which I prescribed her) is for the tremor (not numbness).  Unfortunately, there really isn't any medication to treat numbness.  If there is a pain or discomfort accompanied by the numbness, then we can try gabapentin.  I believe she tried that before.  If it was ineffective, we can try Lyrica 75mg  twice daily.

## 2015-10-01 NOTE — Telephone Encounter (Signed)
Message relayed to patient. Verbalized understanding. Some discomfort, no pain, associated w/ numbness. Has taken gabapentin before, was helpful, however pt was VERY sleepy on it so it was d/c. Will give Lyrica a chance. RX sent in.

## 2015-10-01 NOTE — Telephone Encounter (Signed)
Olie Teat July 30, 2065 called and would like you to please call her back. This is regarding her Primidone 50 mg medication. She has increased it from 2 1/2 to 3 in the  past week. She is experiencing numbness and tingling in her hands. Her call back number is [phone]. Thank you

## 2015-10-01 NOTE — Telephone Encounter (Signed)
Spoke with patient. States that she has had a decline in the last 2-3 weeks. She previously was able to get up and do her whole morning routine (shower, brush teeth, brush hair, etc. 15-20 min. Activity) before her numbness would start. Now patient is only able to have her shoulder/arm raised for about 5 before numbness kicks in. Pt has increased her medication from 2.5 tablets to 3. Has been on this dose for about a week, seen no improvement. Please advise.

## 2015-10-02 NOTE — Telephone Encounter (Signed)
Spoke with patient Grandmother, will have patient call back

## 2015-10-02 NOTE — Telephone Encounter (Signed)
Received denial on Bystolic from [facility] They require that she try atenolol, carvedilol, or metoprolol  Below are the recs from last visit and her next ov is 10/15/15  Finish up your stiolto and then start symbicort 80 Take 2 puffs first thing in am and then another 2 puffs about 12 hours later.   Only use your albuterol(proair) as a rescue medication to be used if you can't catch your breath by resting or doing a relaxed purse lip breathing pattern.  - The less you use it, the better it will work when you need it. - Ok to use up to 2 puffs every 4 hours if you must but call for immediate appointment if use goes up over your usual need - Don't leave home without it !! (think of it like the spare tire for your car)   Please schedule a follow up office visit in 6 weeks, call sooner if needed

## 2015-10-02 NOTE — Telephone Encounter (Signed)
Ok to resume metaprolol 50 mg bid

## 2015-10-05 ENCOUNTER — Other Ambulatory Visit: Payer: Self-pay | Admitting: Internal Medicine

## 2015-10-05 ENCOUNTER — Ambulatory Visit (INDEPENDENT_AMBULATORY_CARE_PROVIDER_SITE_OTHER): Payer: BLUE CROSS/BLUE SHIELD | Admitting: Internal Medicine

## 2015-10-05 ENCOUNTER — Encounter: Payer: Self-pay | Admitting: Internal Medicine

## 2015-10-05 VITALS — BP 120/78 | HR 78 | Ht 62.0 in | Wt 353.4 lb

## 2015-10-05 DIAGNOSIS — J9612 Chronic respiratory failure with hypercapnia: Secondary | ICD-10-CM

## 2015-10-05 DIAGNOSIS — J449 Chronic obstructive pulmonary disease, unspecified: Secondary | ICD-10-CM | POA: Diagnosis not present

## 2015-10-05 DIAGNOSIS — I10 Essential (primary) hypertension: Secondary | ICD-10-CM | POA: Diagnosis not present

## 2015-10-05 DIAGNOSIS — J9611 Chronic respiratory failure with hypoxia: Secondary | ICD-10-CM | POA: Diagnosis not present

## 2015-10-05 MED ORDER — BISOPROLOL FUMARATE 5 MG PO TABS: 5.0000 mg | ORAL_TABLET | Freq: Every day | ORAL | Status: DC

## 2015-10-05 MED ORDER — TIOTROPIUM BROMIDE-OLODATEROL 2.5-2.5 MCG/ACT IN AERS: 2.0000 | INHALATION_SPRAY | Freq: Every day | RESPIRATORY_TRACT | Status: DC

## 2015-10-05 NOTE — Patient Instructions (Signed)
Stop normodyne and symbicort  Start bystolic 10 mg daily = bisoprolol 5 mg daily thru your plan   Start stiolto 2pffs each am   Work on maintaining perfect  inhaler technique:  relax and gently blow all the way out then take a nice smooth deep breath back in, triggering the inhaler at same time you start breathing in.  Hold for up to 5 seconds if you can. Blow out thru nose. Rinse and gargle with water when done  Please schedule a follow up office visit in 4 weeks, sooner if needed

## 2015-10-05 NOTE — Progress Notes (Signed)
Subjective:     Patient ID: Autumn Massey, female   DOB: 06/15/1966    MRN: [id number]     Brief patient profile:  52 yowf  With morbid obesity Quit smoking 05/2015 on admit p several months worsening sob  > ranolph x 5 days > dx copd/ fluid overload and started on spiriva / pred/abx lowest wt 326 but  Started  worse again 1st of Jan 2017 with wt gain / sob s cough  So referred  07/18/2015 by Dr Jenny Reichmann to pulmonary clinic.      History of Present Illness  07/18/2015 1st [facility] office visit/ Autumn Massey  maint rx spiriva  Chief Complaint  Patient presents with  . Pulmonary Consult    Referred by Dr. [name]. Pt c/o SOB since Nov 2016. She states that she was dxed with PNA at this time and then told that she had COPD. She gets SOB walking across a parking lot or doing housework.    has 02 but only uses at hs 2lpm  X years  Presently doe =  MMRC3 = can't walk 100 yards even at a slow pace at a flat grade s stopping due to sob   Onset insidious/ pattern gradually worse assoc with leg swelling and wt loss but s excess/ purulent sputum or mucus plugs Breaths better p saba ? spiriva helping  rec Stop spiriva and start stiolto 2 puffs each am and fill the prescription if you like it, if not resume spiriva handihaler  Only use your albuterol   Stop normodyne  Start bystolic 10 mg daily in place of normodyne  Wear your 02 as much as you can    09/03/2015  f/u ov/Autumn Massey re: obesity/ maint rx = stiolto 2 each am / hbp  Chief Complaint  Patient presents with  . Follow-up    Using 2L O2 at bedtime and while doing a lot of walking and also during the day while at home. Mild wheezing first thing in the morning improves after using inhaler. Denies any coughing, cp/tightness. Pt was having some bladder leakage before but has noticed improvement since starting 02.  resting ok off 02 / sleeping on 02 and leaning on cart on 02 HT / can't do WM yet but trend is improving  rec Finish up your stiolto and  then start  symbicort 80 Take 2 puffs first thing in am and then another 2 puffs about 12 hours later.  Only use your albuterol(proair)  as a rescue medication to be used if you can't catch your breath by resting or doing a relaxed purse lip breathing pattern.  - The less you use it, the better it will work when you need it.    10/05/2015 acute extended ov/Autumn Massey re:  Chief Complaint  Patient presents with  . Acute Visit    SOB, fatigued, wheezing more than usual. Pt checked her O2 when walking today and checked her O2 levels-down to 76-had to put O2 concentrator on to help bring up to uppers 90's. Usually wears O2 QHS only. Pt lcan tell difference in breathing with Stilto-now on Symbicort since last OV instructions. Pt went back to   changed back to labetalol and symbicort about the same time and gradually worse ever since with increased need for saba due to sob assoc with subjective wheeze  s cough but comfortable at rest - no assoc increase in chronic  leg swelling/ calf pain     No obvious day to day or daytime  variability or assoc excess/ purulent sputum or mucus plugs   cp or chest tightness,  or overt sinus or hb symptoms. No unusual exp hx or h/o childhood pna/ asthma or knowledge of premature birth.  Sleeping ok without nocturnal  or early am exacerbation  of respiratory  c/o's or need for noct saba. Also denies any obvious fluctuation of symptoms with weather or environmental changes or other aggravating or alleviating factors except as outlined above   Current Medications, Allergies, Complete Past Medical History, Past Surgical History, Family History, and Social History were reviewed in [employer] record.  ROS  The following are not active complaints unless bolded sore throat, dysphagia, dental problems, itching, sneezing,  nasal congestion or excess/ purulent secretions, ear ache,   fever, chills, sweats, unintended wt loss, classically pleuritic or exertional  cp, hemoptysis,  orthopnea pnd or leg swelling, presyncope, palpitations, abdominal pain, anorexia, nausea, vomiting, diarrhea  or change in bowel or bladder habits, change in stools or urine, dysuria,hematuria,  rash, arthralgias, visual complaints, headache, numbness, weakness or ataxia or problems with walking or coordination,  change in mood/affect or memory.             Objective:   Physical Exam   Massively obese wf nad   10/06/2015          353  09/03/2015          353   07/18/15 349 lb 9.6 oz (158.578 kg)  05/31/15 332 lb (150.594 kg)  01/29/15 333 lb (151.048 kg)    Vital signs reviewed    HEENT: nl dentition, turbinates, and oropharynx. Nl external ear canals without cough reflex   NECK :  without JVD/Nodes/TM/ nl carotid upstrokes bilaterally   LUNGS: no acc muscle use,  Nl contour chest which is clear to A and P bilaterally without cough on insp or exp maneuvers   CV:  RRR  no s3 or murmur or increase in P2, trace pitting  edema/ severe chronic venous stasis changes both LE's   ABD:  soft and nontender with nl inspiratory excursion in the supine position. No bruits or organomegaly, bowel sounds nl  MS:  Nl gait/ ext warm without deformities, calf tenderness, cyanosis or clubbing No obvious joint restrictions   SKIN: warm and dry without lesions    NEURO:  alert, approp, nl sensorium with  no motor deficits          Assessment:

## 2015-10-06 NOTE — Assessment & Plan Note (Signed)
Strongly prefer in this setting: Bystolic, the most beta -1  selective Beta blocker available in sample form, with bisoprolol the most selective generic choice  on the market.   Will try bystolic 10 mg daily samples and rx for the bisoprolol 5 mg depending on insurance coverage but clearly should not be on labetolol if she is truly saba dependent due to significant spilloever B2 with this B blocker

## 2015-10-06 NOTE — Assessment & Plan Note (Addendum)
-   PFT's  05/22/10   FEV1 1.53 (62 % ) ratio 86  p no % improvement from saba with DLCO  87 % and ERV 40% at wt 209  - Quit smoking 05/2015 - 07/18/2015    try stiolto - PFT's s obst 09/03/2015 with ERV 12 p am stiolto so rec change to symbicort 80 2bid - 10/05/2015  extensive coaching HFA effectiveness =    75% (short Ti)  - 10/05/2015 worse on symbicort while back on labetalol > resumed stiolto   Symptoms remain  disproportionate to objective findings and not clear this is a lung problem but pt does appear to have difficult airway management issues. DDX of  difficult airways management almost all start with A and  include Adherence, Ace Inhibitors, Acid Reflux, Active Sinus Disease, Alpha 1 Antitripsin deficiency, Anxiety masquerading as Airways dz,  ABPA,  Allergy(esp in young), Aspiration (esp in elderly), Adverse effects of meds,  Active smokers, A bunch of PE's (a small clot burden can't cause this syndrome unless there is already severe underlying pulm or vascular dz with poor reserve) plus two Bs  = Bronchiectasis and Beta blocker use..and one C= CHF  Adherence is always the initial "prime suspect" and is a multilayered concern that requires a "trust but verify" approach in every patient - starting with knowing how to use medications, especially inhalers, correctly, keeping up with refills and understanding the fundamental difference between maintenance and prns vs those medications only taken for a very short course and then stopped and not refilled.  - The proper method of use, as well as anticipated side effects, of a metered-dose inhaler are discussed and demonstrated to the patient. Improved effectiveness after extensive coaching during this visit to a level of approximately 90% from a baseline of 75% > resume stiolto   ? Acid (or non-acid) GERD > always difficult to exclude as up to 75% of pts in some series report no assoc GI/ Heartburn symptoms> rec consider trial of  max (24h)  acid suppression  and diet restrictions/ reviewed and instructions given in writing.   ? A bunch of PE's > she is at risk but nothing def to suggest at this point   ? Anxiety related to restrictive changes from obesity > dx of exlcusion, esp with desats noted   ? BB > worsened p changed back to labetolol (see separate a/p)   I had an extended discussion with the patient reviewing all relevant studies completed to date and  lasting 15 to 20 minutes of a 25 minute visit    Each maintenance medication was reviewed in detail including most importantly the difference between maintenance and prns and under what circumstances the prns are to be triggered using an action plan format that is not reflected in the computer generated alphabetically organized AVS.    Please see instructions for details which were reviewed in writing and the patient given a copy highlighting the part that I personally wrote and discussed at today's ov.

## 2015-10-06 NOTE — Assessment & Plan Note (Signed)
HCO3  07/23/15  = 30 c/w only mild hypercarbia  02 2lpm hs and prn daytime  as of 10/05/2015

## 2015-10-08 ENCOUNTER — Other Ambulatory Visit: Payer: Self-pay | Admitting: Internal Medicine

## 2015-10-08 NOTE — Telephone Encounter (Signed)
Please advise, thanks.

## 2015-10-09 ENCOUNTER — Other Ambulatory Visit (INDEPENDENT_AMBULATORY_CARE_PROVIDER_SITE_OTHER): Payer: BLUE CROSS/BLUE SHIELD

## 2015-10-09 ENCOUNTER — Telehealth: Payer: Self-pay

## 2015-10-09 ENCOUNTER — Telehealth: Payer: Self-pay | Admitting: Internal Medicine

## 2015-10-09 ENCOUNTER — Encounter: Payer: Self-pay | Admitting: Internal Medicine

## 2015-10-09 ENCOUNTER — Ambulatory Visit (INDEPENDENT_AMBULATORY_CARE_PROVIDER_SITE_OTHER): Payer: BLUE CROSS/BLUE SHIELD | Admitting: Internal Medicine

## 2015-10-09 VITALS — BP 110/70 | HR 64 | Ht 62.0 in | Wt 354.0 lb

## 2015-10-09 DIAGNOSIS — R0600 Dyspnea, unspecified: Secondary | ICD-10-CM | POA: Diagnosis not present

## 2015-10-09 DIAGNOSIS — J9612 Chronic respiratory failure with hypercapnia: Secondary | ICD-10-CM | POA: Diagnosis not present

## 2015-10-09 DIAGNOSIS — J9611 Chronic respiratory failure with hypoxia: Secondary | ICD-10-CM | POA: Diagnosis not present

## 2015-10-09 DIAGNOSIS — J449 Chronic obstructive pulmonary disease, unspecified: Secondary | ICD-10-CM | POA: Diagnosis not present

## 2015-10-09 LAB — CBC WITH DIFFERENTIAL/PLATELET
BASOS ABS: 0 10*3/uL (ref 0.0–0.1)
Basophils Relative: 0.4 % (ref 0.0–3.0)
EOS ABS: 0.2 10*3/uL (ref 0.0–0.7)
Eosinophils Relative: 2.3 % (ref 0.0–5.0)
HEMATOCRIT: 39.2 % (ref 36.0–46.0)
HEMOGLOBIN: 12.9 g/dL (ref 12.0–15.0)
LYMPHS PCT: 12.7 % (ref 12.0–46.0)
Lymphs Abs: 1.1 10*3/uL (ref 0.7–4.0)
MCHC: 32.8 g/dL (ref 30.0–36.0)
MCV: 94 fl (ref 78.0–100.0)
MONO ABS: 0.9 10*3/uL (ref 0.1–1.0)
Monocytes Relative: 9.8 % (ref 3.0–12.0)
NEUTROS ABS: 6.7 10*3/uL (ref 1.4–7.7)
Neutrophils Relative %: 74.8 % (ref 43.0–77.0)
PLATELETS: 234 10*3/uL (ref 150.0–400.0)
RBC: 4.17 Mil/uL (ref 3.87–5.11)
RDW: 12.8 % (ref 11.5–15.5)
WBC: 9 10*3/uL (ref 4.0–10.5)

## 2015-10-09 LAB — BASIC METABOLIC PANEL
BUN: 17 mg/dL (ref 6–23)
CALCIUM: 9.7 mg/dL (ref 8.4–10.5)
CO2: 33 mEq/L — ABNORMAL HIGH (ref 19–32)
CREATININE: 0.73 mg/dL (ref 0.40–1.20)
Chloride: 98 mEq/L (ref 96–112)
GFR: 89.76 mL/min (ref >60.00)
Glucose, Bld: 123 mg/dL — ABNORMAL HIGH (ref 70–99)
Potassium: 4.3 mEq/L (ref 3.5–5.1)
Sodium: 140 mEq/L (ref 135–145)

## 2015-10-09 LAB — BRAIN NATRIURETIC PEPTIDE: Pro B Natriuretic peptide (BNP): 66 pg/mL (ref 0.0–100.0)

## 2015-10-09 NOTE — Patient Instructions (Addendum)
Please remember to go to the lab department downstairs for your tests - we will call you with the results when they are available.    Please see patient coordinator before you leave today  to schedule CTa Chest   Add: last bun > 30 so changed to v/q

## 2015-10-09 NOTE — Telephone Encounter (Signed)
Normally this needs a Visit, please consider OV, first available

## 2015-10-09 NOTE — Telephone Encounter (Signed)
Last ov on 10/05/15 with MW  Patient Instructions       Stop normodyne and symbicort  Start bystolic 10 mg daily = bisoprolol 5 mg daily thru your plan   Start stiolto 2pffs each am   Work on maintaining perfect  inhaler technique:  relax and gently blow all the way out then take a nice smooth deep breath back in, triggering the inhaler at same time you start breathing in.  Hold for up to 5 seconds if you can. Blow out thru nose. Rinse and gargle with water when done  Please schedule a follow up office visit in 4 weeks, sooner if needed    Called and spoke with the pt. She c/o increased SOB starting after seeing MW on 10/05/15. She states that she is using 2L 24/7 since seeing MW on 10/05/15. She states she is currently taking stiolto and bisoprolol. She states that she has tried to come off of her oxygen this weekend and her oxygen levels drop to 88%. She also states that with 2L of oxygen she drops to 87% with any exertion. I scheduled her with MW on 10/09/15 at 1:30pm. She voiced understanding and had no further questions. Nothing further needed.

## 2015-10-09 NOTE — Telephone Encounter (Signed)
Pt came in to follow up for this request, advise pt that she need an ov. Pt aware but wanted to be seen today due to pain. Offer an appt tomorrw and other offices, pt state she might just go to urgent care or ER.

## 2015-10-09 NOTE — Progress Notes (Signed)
Subjective:     Patient ID: Autumn Massey, female   DOB: August 16, 1965    MRN: [id number]     Brief patient profile:  32 yowf  With morbid obesity Quit smoking 05/2015 on admit p several months worsening sob  > [place] x 5 days > dx copd/ fluid overload and started on spiriva / pred/abx lowest wt 326 but  Started  worse again 1st of Jan 2017 with wt gain / sob s cough  So referred  07/18/2015 by Dr Jenny Reichmann to pulmonary clinic.      History of Present Illness  07/18/2015 1st [facility] office visit/ Autumn Massey  maint rx spiriva  Chief Complaint  Patient presents with  . Pulmonary Consult    Referred by Dr. [name]. Pt c/o SOB since Nov 2016. She states that she was dxed with PNA at this time and then told that she had COPD. She gets SOB walking across a parking lot or doing housework.    has 02 but only uses at hs 2lpm  X years  Presently doe =  MMRC3 = can't walk 100 yards even at a slow pace at a flat grade s stopping due to sob   Onset insidious/ pattern gradually worse assoc with leg swelling and wt loss but s excess/ purulent sputum or mucus plugs Breaths better p saba ? spiriva helping  rec Stop spiriva and start stiolto 2 puffs each am and fill the prescription if you like it, if not resume spiriva handihaler  Only use your albuterol   Stop normodyne  Start bystolic 10 mg daily in place of normodyne  Wear your 02 as much as you can    09/03/2015  f/u ov/Autumn Massey re: obesity/ maint rx = stiolto 2 each am / hbp  Chief Complaint  Patient presents with  . Follow-up    Using 2L O2 at bedtime and while doing a lot of walking and also during the day while at home. Mild wheezing first thing in the morning improves after using inhaler. Denies any coughing, cp/tightness. Pt was having some bladder leakage before but has noticed improvement since starting 02.  resting ok off 02 / sleeping on 02 and leaning on cart on 02 HT / can't do WM yet but trend is improving  rec Finish up your stiolto and  then start  symbicort 80 Take 2 puffs first thing in am and then another 2 puffs about 12 hours later.  Only use your albuterol(proair)  as a rescue medication to be used if you can't catch your breath by resting or doing a relaxed purse lip breathing pattern.  - The less you use it, the better it will work when you need it.    10/05/2015 acute extended ov/Autumn Massey re: sob  Chief Complaint  Patient presents with  . Acute Visit    SOB, fatigued, wheezing more than usual. Pt checked her O2 when walking today and checked her O2 levels-down to 76-had to put O2 concentrator on to help bring up to uppers 90's. Usually wears O2 QHS only. Pt lcan tell difference in breathing with Stilto-now on Symbicort since last OV instructions. Pt went back to   changed back to labetalol and symbicort about the same time and gradually worse ever since with increased need for saba due to sob assoc with subjective wheeze  s cough but comfortable at rest - no assoc increase in chronic  leg swelling/ calf pain  rec Stop normodyne and symbicort Start bystolic 10 mg  daily = bisoprolol 5 mg daily thru your plan  Start stiolto 2pffs each am  Work on maintaining perfect  inhaler technique:    10/09/2015 acute extended ov/Autumn Massey re:  02 dep resp failure / ? Now hypercarbic  Chief Complaint  Patient presents with  . Acute Visit    Breathing progressively worse since 10/05/15 ov. She has also had some wheezing, chest tightness and non prod cough for the past 2 days.   on 02 at rest fine, but across the room sob and desat and now coughing also mostly dry    No obvious day to day or daytime variability or assoc excess/ purulent sputum or mucus plugs   cp or chest tightness,  or overt sinus or hb symptoms. No unusual exp hx or h/o childhood pna/ asthma or knowledge of premature birth.  Sleeping ok without nocturnal  or early am exacerbation  of respiratory  c/o's or need for noct saba. Also denies any obvious fluctuation of symptoms  with weather or environmental changes or other aggravating or alleviating factors except as outlined above   Current Medications, Allergies, Complete Past Medical History, Past Surgical History, Family History, and Social History were reviewed in [employer] record.  ROS  The following are not active complaints unless bolded sore throat, dysphagia, dental problems, itching, sneezing,  nasal congestion or excess/ purulent secretions, ear ache,   fever, chills, sweats, unintended wt loss, classically pleuritic or exertional cp, hemoptysis,  orthopnea pnd or leg swelling, presyncope, palpitations, abdominal pain, anorexia, nausea, vomiting, diarrhea  or change in bowel or bladder habits, change in stools or urine, dysuria,hematuria,  rash, arthralgias, visual complaints, headache, numbness, weakness or ataxia or problems with walking or coordination,  change in mood/affect or memory.             Objective:   Physical Exam   Massively obese wf nad   10/06/2015          353 > 10/09/2015   354  09/03/2015          353   07/18/15 349 lb 9.6 oz (158.578 kg)  05/31/15 332 lb (150.594 kg)  01/29/15 333 lb (151.048 kg)    Vital signs reviewed    HEENT: nl dentition, turbinates, and oropharynx. Nl external ear canals without cough reflex   NECK :  without JVD/Nodes/TM/ nl carotid upstrokes bilaterally   LUNGS: no acc muscle use,  Nl contour chest which is clear to A and P bilaterally without cough on insp or exp maneuvers   CV:  RRR  no s3 or murmur or increase in P2, trace pitting  edema/ severe chronic venous stasis changes both LE's   ABD:  soft and nontender with nl inspiratory excursion in the supine position. No bruits or organomegaly, bowel sounds nl  MS:  Nl gait/ ext warm without deformities, calf tenderness, cyanosis or clubbing No obvious joint restrictions   SKIN: warm and dry without lesions    NEURO:  alert, approp, nl sensorium with  no motor deficits       Labs ordered/ reviewed:      Chemistry      Component Value Date/Time   NA 140 10/09/2015 1424   K 4.3 10/09/2015 1424   CL 98 10/09/2015 1424   CO2 33* 10/09/2015 1424   BUN 17 10/09/2015 1424   CREATININE 0.73 10/09/2015 1424   CREATININE 0.80 07/23/2015 1530      Component Value Date/Time   CALCIUM 9.7 10/09/2015  1424   ALKPHOS 43 07/23/2015 1530   AST 17 07/23/2015 1530   ALT 21 07/23/2015 1530   BILITOT 0.5 07/23/2015 1530        Lab Results  Component Value Date   WBC 9.0 10/09/2015   HGB 12.9 10/09/2015   HCT 39.2 10/09/2015   MCV 94.0 10/09/2015   PLT 234.0 10/09/2015     Lab Results  Component Value Date   DDIMER 1.03* 10/09/2015      Lab Results  Component Value Date   TSH 2.25 07/23/2015     Lab Results  Component Value Date   PROBNP 66.0 10/09/2015          Assessment:

## 2015-10-09 NOTE — Telephone Encounter (Signed)
Patient called and stated that she has hurt her right foot. She will be going to a office visit with pulmonary today and was wondering if while she was over here could she get an xray of her foot. Is this possible for [name] to put a order in or will she have to be seen. Please advise

## 2015-10-10 ENCOUNTER — Other Ambulatory Visit: Payer: Self-pay | Admitting: Internal Medicine

## 2015-10-10 ENCOUNTER — Encounter: Payer: Self-pay | Admitting: Internal Medicine

## 2015-10-10 ENCOUNTER — Encounter (HOSPITAL_COMMUNITY): Payer: Self-pay | Admitting: Emergency Medicine

## 2015-10-10 ENCOUNTER — Emergency Department (HOSPITAL_COMMUNITY): Payer: BLUE CROSS/BLUE SHIELD

## 2015-10-10 ENCOUNTER — Ambulatory Visit (HOSPITAL_COMMUNITY)
Admission: RE | Admit: 2015-10-10 | Discharge: 2015-10-10 | Disposition: A | Discharge status: Home / Self Care | Payer: BLUE CROSS/BLUE SHIELD | Source: Ambulatory Visit | Attending: Internal Medicine | Admitting: Internal Medicine

## 2015-10-10 ENCOUNTER — Emergency Department (HOSPITAL_BASED_OUTPATIENT_CLINIC_OR_DEPARTMENT_OTHER)
Admit: 2015-10-10 | Discharge: 2015-10-10 | Disposition: A | Discharge status: Home / Self Care | Payer: BLUE CROSS/BLUE SHIELD | Attending: Emergency Medicine | Admitting: Emergency Medicine

## 2015-10-10 ENCOUNTER — Emergency Department (HOSPITAL_COMMUNITY)
Admission: EM | Admit: 2015-10-10 | Discharge: 2015-10-10 | Disposition: A | Discharge status: Home / Self Care | Payer: BLUE CROSS/BLUE SHIELD | Attending: Emergency Medicine | Admitting: Emergency Medicine

## 2015-10-10 DIAGNOSIS — Z7982 Long term (current) use of aspirin: Secondary | ICD-10-CM | POA: Insufficient documentation

## 2015-10-10 DIAGNOSIS — Z8701 Personal history of pneumonia (recurrent): Secondary | ICD-10-CM | POA: Insufficient documentation

## 2015-10-10 DIAGNOSIS — Z87891 Personal history of nicotine dependence: Secondary | ICD-10-CM | POA: Insufficient documentation

## 2015-10-10 DIAGNOSIS — R2242 Localized swelling, mass and lump, left lower limb: Secondary | ICD-10-CM | POA: Insufficient documentation

## 2015-10-10 DIAGNOSIS — F329 Major depressive disorder, single episode, unspecified: Secondary | ICD-10-CM | POA: Insufficient documentation

## 2015-10-10 DIAGNOSIS — E039 Hypothyroidism, unspecified: Secondary | ICD-10-CM | POA: Insufficient documentation

## 2015-10-10 DIAGNOSIS — I517 Cardiomegaly: Secondary | ICD-10-CM | POA: Insufficient documentation

## 2015-10-10 DIAGNOSIS — R0600 Dyspnea, unspecified: Secondary | ICD-10-CM

## 2015-10-10 DIAGNOSIS — M5134 Other intervertebral disc degeneration, thoracic region: Secondary | ICD-10-CM | POA: Insufficient documentation

## 2015-10-10 DIAGNOSIS — I10 Essential (primary) hypertension: Secondary | ICD-10-CM | POA: Insufficient documentation

## 2015-10-10 DIAGNOSIS — M79609 Pain in unspecified limb: Secondary | ICD-10-CM

## 2015-10-10 DIAGNOSIS — M79672 Pain in left foot: Secondary | ICD-10-CM

## 2015-10-10 DIAGNOSIS — Z79899 Other long term (current) drug therapy: Secondary | ICD-10-CM | POA: Insufficient documentation

## 2015-10-10 DIAGNOSIS — E785 Hyperlipidemia, unspecified: Secondary | ICD-10-CM | POA: Insufficient documentation

## 2015-10-10 DIAGNOSIS — Z862 Personal history of diseases of the blood and blood-forming organs and certain disorders involving the immune mechanism: Secondary | ICD-10-CM | POA: Insufficient documentation

## 2015-10-10 DIAGNOSIS — Z8669 Personal history of other diseases of the nervous system and sense organs: Secondary | ICD-10-CM | POA: Insufficient documentation

## 2015-10-10 DIAGNOSIS — F419 Anxiety disorder, unspecified: Secondary | ICD-10-CM | POA: Insufficient documentation

## 2015-10-10 LAB — D-DIMER, QUANTITATIVE: D-Dimer, Quant: 1.03 ug/mL-FEU — ABNORMAL HIGH (ref 0.00–0.48)

## 2015-10-10 MED ORDER — HYDROCODONE-ACETAMINOPHEN 5-325 MG PO TABS: 1.0000 | ORAL_TABLET | Freq: Four times a day (QID) | ORAL | Status: DC | PRN

## 2015-10-10 MED ORDER — TECHNETIUM TO 99M ALBUMIN AGGREGATED
4.4000 | Freq: Once | INTRAVENOUS | Status: AC | PRN
  Administered 2015-10-10: 4 via INTRAVENOUS

## 2015-10-10 MED ORDER — NAPROXEN 500 MG PO TABS: 500.0000 mg | ORAL_TABLET | Freq: Two times a day (BID) | ORAL | Status: DC

## 2015-10-10 MED ORDER — TECHNETIUM TC 99M DIETHYLENETRIAME-PENTAACETIC ACID: 32.7000 | Freq: Once | INTRAVENOUS | Status: DC | PRN

## 2015-10-10 NOTE — Telephone Encounter (Signed)
Please advise can we refill these

## 2015-10-10 NOTE — Assessment & Plan Note (Addendum)
- PFT's  05/22/10   FEV1 1.53 (62 % ) ratio 86  p no % improvement from saba with DLCO  87 % and ERV 40% at wt 209  - Quit smoking 05/2015 - 07/18/2015    try stiolto - PFT's s obst 09/03/2015 with ERV 12 p am stiolto so rec change to symbicort 80 2bid - 10/05/2015  extensive coaching HFA effectiveness =    90%  - 10/05/2015 worse on symbicort while back on labetalol > resumed stiolto / bystolic   Symptoms are markedly disproportionate to objective findings and not clear this is a lung problem but pt does appear to have difficult airway management issues. DDX of  difficult airways management almost all start with A and  include Adherence, Ace Inhibitors, Acid Reflux, Active Sinus Disease, Alpha 1 Antitripsin deficiency, Anxiety masquerading as Airways dz,  ABPA,  Allergy(esp in young), Aspiration (esp in elderly), Adverse effects of meds,  Active smokers, A bunch of PE's (a small clot burden can't cause this syndrome unless there is already severe underlying pulm or vascular dz with poor reserve) plus two Bs  = Bronchiectasis and Beta blocker use..and one C= CHF   Adherence is always the initial "prime suspect" and is a multilayered concern that requires a "trust but verify" approach in every patient - starting with knowing how to use medications, especially inhalers, correctly, keeping up with refills and understanding the fundamental difference between maintenance and prns vs those medications only taken for a very short course and then stopped and not refilled.   ? Acid (or non-acid) GERD > always difficult to exclude as up to 75% of pts in some series report no assoc GI/ Heartburn symptoms> if w/u for pe neg  rec trial max (24h)  acid suppression and diet restrictions/ reviewed and instructions given in writing.   ? Allergy > doubt as worse on ICS > resume stiolto for now  ? A bunch of PE's > probably not as hc03 up from 30 > 33 during time more sob but D dimer is elevated   ? Anxiety > usually at the  bottom of this list of usual suspects but should be much higher on this pt's based on H and P and note already on psychotropics .   ? Active smoking >   I reviewed the Fletcher curve with the patient that basically indicates  if you quit smoking when your best day FEV1 is still well preserved (as is clearly  the case here)  it is highly unlikely you will progress to severe disease and informed the patient there was no medication on the market that has proven to alter the curve/ its downward trajectory  or the likelihood of progression of their disease.  Therefore stopping smoking and maintaining abstinence is the most important aspect of care, not choice of inhalers or for that matter, doctors.    ? BB > change back to bystolic for now   ? CHF > suggested by cxr but very unlikely with bnp so low (need to keep in mind bnp can be lower in obesity)  I had an extended discussion with the patient reviewing all relevant studies completed to date and  lasting  25 minutes of a 40 minute acute extended office visit    Each maintenance medication was reviewed in detail including most importantly the difference between maintenance and prns and under what circumstances the prns are to be triggered using an action plan format that is not reflected in  the computer generated alphabetically organized AVS.    Please see instructions for details which were reviewed in writing and the patient given a copy highlighting the part that I personally wrote and discussed at today's ov.

## 2015-10-10 NOTE — Progress Notes (Signed)
ED Consult for DME for pt  CM assessed pt for need of DME Cm determined pt related injury and wt/ht may need w/c with leg rests Pt confirms she did not have on a w/c but reports her 50 yr old grandmother she lives with does have one that she can use if needed CM offered to get pt her own w/c via [facility] but she prefers not to get a w/c now and to have EDP PA/NP to speak with her again about the cause of the injury  Pt states [facility] her oxygen at home for her respiratory issues Cm discussed [facility] store and option of delivery with possible delivery fee Cm informed pt CM would pend DME consult and ask ED PA/NP to return to speak with her  CM updated ED PA/NP Abiligail about pt voiced concerns -pt mentioned reproducing the pain while in bed with bending her toes toward her head Pt mentioned to CM she has been "babying it" without success.  Imaging/Ultrasound shows negative for DVT and fracture

## 2015-10-10 NOTE — Progress Notes (Signed)
*  Preliminary Results* Left lower extremity venous duplex completed. Study was technically limited due to patient body habitus. Visualized veins of the left lower extremity are negative for deep vein thrombosis. There is no evidence of left Baker's cyst.  10/10/2015 12:43 PM  Maudry Mayhew, RVT, RDCS, RDMS

## 2015-10-10 NOTE — ED Provider Notes (Signed)
CSN: [id number]     Arrival date & time 10/10/15  1033 History   First MD Initiated Contact with Patient 10/10/15 1127     Chief Complaint  Patient presents with  . Foot Pain  . Foot Swelling     (Consider location/radiation/quality/duration/timing/severity/associated sxs/prior Treatment) HPI This is a 50 year old female with past medical history of morbid obesity, obesity, hypoventilation associated with sleep, hypothyroidism, polycythemia who presents emergency Department with chief complaint of left foot pain. The patient has a history of development of pneumonia in 2016 with hypoxemia. Previously, she required 2 L via nasal cannula at night. 4. Obesity hypoventilation. After the pneumonia. She was on 2 L of oxygen at all times. She was able to wean herself. Over time, and by January 2017 had fully recuperated. Patient states about 2 weeks ago she had sudden onset shortness of breath that was worse with any exertion including walking just a few feet. Patient saw her pulmonologist, Dr. Melvyn Novas, and was diagnosed with hypoxemia on exertion. Patient states that she desaturates into the 80s. She was placed on 2 L of oxygen via nasal cannula, but continues to have desaturation events. She had a VQ scan this morning, which was negative for suggestion of pulmonary embolus. 3 days ago the patient developed pain in the left foot. She describes the pain as moderate at rest but severe when inverting the foot, or ambulating. She has no pain with ambulation on the heel of her foot. She states that the pain is sharp and severe. On ambulation or with inversion. She denies any recent injuries to the foot. She denies history of gout. She has some minor swelling without heat, redness. She denies any recent symptoms of infection such as fever, chills, myalgias. Past Medical History  Diagnosis Date  . ANXIETY DEPRESSION 01/29/2009  . DEPRESSION 11/19/2007  . FREQUENCY, URINARY 10/17/2009  . HYPERLIPIDEMIA 11/19/2007  .  HYPERTENSION 11/19/2007  . HYPOTHYROIDISM 11/19/2007  . PNEUMONIA 08/06/2010  . POLYCYTHEMIA 01/29/2009  . Preventative health care 04/06/2011  . SLEEP RELATED HYPOVENTILATION/HYPOXEMIA CCE 02/08/2009  . Morbid obesity ([place])   . Venous insufficiency 07/19/2012  . Sleep apnea     mild sleep apnea, on o2 at 2l at nighttime   Past Surgical History  Procedure Laterality Date  . Tonsillectomy    . Ulnar tunnel release Left 02/22/2013    Procedure: LEFT ULNAR NERVE DECOMPRESSION ;  Surgeon: Tennis Must, MD;  Location: [facility];  Service: Orthopedics;  Laterality: Left;   Family History  Problem Relation Age of Onset  . Cancer Mother     melanoma   . Cancer Maternal Grandfather     prostate cancer  . Alcohol abuse Other   . Arthritis Other   . Hypertension Other    Social History  Substance Use Topics  . Smoking status: Former Smoker -- 0.50 packs/day for 22 years    Types: Cigarettes    Quit date: 05/22/2015  . Smokeless tobacco: Never Used  . Alcohol Use: 0.0 oz/week    0 Standard drinks or equivalent per week     Comment: rare   OB History    No data available     Review of Systems  Ten systems reviewed and are negative for acute change, except as noted in the HPI.    Allergies  Review of patient's allergies indicates no known allergies.  Home Medications   Prior to Admission medications   Medication Sig Start Date End Date Taking? Authorizing  Provider  albuterol (PROVENTIL HFA;VENTOLIN HFA) 108 (90 BASE) MCG/ACT inhaler Inhale 2 puffs into the lungs every 6 (six) hours as needed for wheezing or shortness of breath.   Yes Historical Provider, MD  aspirin EC 81 MG tablet Take 81 mg by mouth daily.   Yes Historical Provider, MD  clonazePAM (KLONOPIN) 0.5 MG tablet Take 1 tablet (0.5 mg total) by mouth 2 (two) times daily as needed for anxiety. 12/27/14  Yes Biagio Borg, MD  FLUoxetine (PROZAC) 20 MG capsule take 1 capsule by mouth once daily 04/20/15  Yes  Biagio Borg, MD  furosemide (LASIX) 40 MG tablet TAKE ONE TABLET BY MOUTH EVERY DAY 10/08/15  Yes Biagio Borg, MD  hydrochlorothiazide (HYDRODIURIL) 25 MG tablet TAKE ONE TABLET BY MOUTH EVERY DAY 07/04/15  Yes Biagio Borg, MD  irbesartan (AVAPRO) 300 MG tablet TAKE ONE TABLET BY MOUTH AT BEDTIME 12/06/14  Yes Biagio Borg, MD  levothyroxine (SYNTHROID, LEVOTHROID) 112 MCG tablet TAKE TWO tablets BY MOUTH EVERY MORNING BEFORE breakfast 09/13/15  Yes Philemon Kingdom, MD  nebivolol (BYSTOLIC) 10 MG tablet Take 1 tablet (10 mg total) by mouth daily. 09/27/15  Yes Tanda Rockers, MD  primidone (MYSOLINE) 50 MG tablet Take 2.5 tablets (125 mg total) by mouth at bedtime. Patient taking differently: Take 150 mg by mouth at bedtime.  07/23/15  Yes Adam Telford Nab, DO  RESTASIS 0.05 % ophthalmic emulsion Place 1 drop into both eyes 2 (two) times daily. 09/25/15  Yes Historical Provider, MD  simvastatin (ZOCOR) 40 MG tablet TAKE 1 TABLET BY MOUTH AT BEDTIME 12/06/14  Yes Biagio Borg, MD  spironolactone (ALDACTONE) 25 MG tablet Take 1 tablet (25 mg total) by mouth 2 (two) times daily. **PT NEEDS FOLLOW UP APPT FOR FURTHER REFILLS** 10/08/15  Yes Philemon Kingdom, MD  Tiotropium Bromide-Olodaterol (STIOLTO RESPIMAT) 2.5-2.5 MCG/ACT AERS Inhale 2 puffs into the lungs daily. 10/05/15  Yes Tanda Rockers, MD  bisoprolol (ZEBETA) 5 MG tablet Take 1 tablet (5 mg total) by mouth daily. Patient not taking: Reported on 10/09/2015 10/05/15   Tanda Rockers, MD  glucose blood test strip Use to test blood sugar 1 time daily as instructed. 05/29/15   Philemon Kingdom, MD  NON FORMULARY Oxygen 2L use as bedtime    Historical Provider, MD  pregabalin (LYRICA) 75 MG capsule Take 1 capsule (75 mg total) by mouth 2 (two) times daily. 10/01/15   Pieter Partridge, DO   Pulse 68  Temp(Src) 97.8 F (36.6 C) (Oral)  Resp 18  SpO2 91% Physical Exam  Constitutional: She is oriented to person, place, and time. She appears well-developed and  well-nourished. No distress.  Morbidly obese female in no acute distress. Normal oxygen saturations on nasal cannula.  HENT:  Head: Normocephalic and atraumatic.  Eyes: Conjunctivae are normal. No scleral icterus.  Neck: Normal range of motion.  Cardiovascular: Normal rate, regular rhythm and normal heart sounds.  Exam reveals no gallop and no friction rub.   No murmur heard. Bilateral lower extremity swelling and venous stasis tattooing. No calf tenderness. Negative Homans's sign  Pulmonary/Chest: Effort normal and breath sounds normal. No respiratory distress.  Abdominal: Soft. Bowel sounds are normal. She exhibits no distension and no mass. There is no tenderness. There is no guarding.  Musculoskeletal:  Her left foot with pain over the deltoid ligament, movement, minimal swelling. Range of motion is limited due to pain, especially with inversion of the ankle. Tender to palpation  along the plantar surface of the fifth metatarsal. No heel tenderness. No heat or redness suggestive of infection or gallop. Neurovascularly intact.  Neurological: She is alert and oriented to person, place, and time.  Skin: Skin is warm and dry. She is not diaphoretic.  Nursing note and vitals reviewed.   ED Course  Procedures (including critical care time) Labs Review Labs Reviewed - No data to display  Imaging Review Dg Chest 2 View  10/10/2015  CLINICAL DATA:  Dyspnea, pneumonia EXAM: CHEST  2 VIEW COMPARISON:  05/23/2015 FINDINGS: Cardiomegaly. Study is limited by patient's large body habitus. Central mild vascular congestion without convincing pulmonary edema. No segmental infiltrates. No pneumothorax. Mild degenerative changes thoracic spine. IMPRESSION: Limited study by patient's large body habitus. Cardiomegaly again noted. Central vascular congestion without convincing pulmonary edema. No segmental infiltrate. Degenerative changes thoracic spine. Electronically Signed   By: Lahoma Crocker M.D.   On:  10/10/2015 09:23   Nm Pulmonary Per & Vent  10/10/2015  CLINICAL DATA:  Shortness of breath. EXAM: NUCLEAR MEDICINE VENTILATION - PERFUSION LUNG SCAN TECHNIQUE: Ventilation images were obtained in multiple projections using inhaled aerosol Tc-4m DTPA. Perfusion images were obtained in multiple projections after intravenous injection of Tc-91m MAA. RADIOPHARMACEUTICALS:  32.7 mCi Technetium-18m DTPA aerosol inhalation and 4.4 mCi Technetium-39m MAA IV COMPARISON:  Chest x-ray 10/10/2015. FINDINGS: Exam limited by patient's body habitus and shortness of breath. Ventilation: Poor ventilation due to shortness of breath. Perfusion: No wedge shaped peripheral perfusion defects to suggest acute pulmonary embolism. IMPRESSION: Low probability pulmonary embolus. Electronically Signed   By: Marcello Moores  Register   On: 10/10/2015 10:40   Dg Foot Complete Left  10/10/2015  CLINICAL DATA:  LEFT foot pain for 4-5 days.  No reported injury. EXAM: LEFT FOOT - COMPLETE 3+ VIEW COMPARISON:  None. FINDINGS: There is no evidence of fracture or dislocation. There is no evidence of arthropathy or other focal bone abnormality. Soft tissues are unremarkable. IMPRESSION: Negative. Electronically Signed   By: Staci Righter M.D.   On: 10/10/2015 13:14   I have personally reviewed and evaluated these images and lab results as part of my medical decision-making.   EKG Interpretation None      MDM   Final diagnoses:  Foot pain, left    Patient with negative x-ray, negative DVT study. Case management consultation. Patient does have a wheelchair at her home which she can use to decrease pressure on the foot. She is able to ambulate, however, it is quite painful. Patient states she has an established relationship with Dr. Gardenia Phlegm, D.O. in sports medicine and would prefer to follow up with him. I see no signs of infection, injury or other emergent cause of foot pain. Discussed the findings with the patient. She appears safe for  discharge at this time.    Margarita Mail, PA-C 10/10/15 [place], MD 10/10/15 [phone]

## 2015-10-10 NOTE — Telephone Encounter (Signed)
Done erx  Due ROV for further refills

## 2015-10-10 NOTE — Discharge Instructions (Signed)

## 2015-10-10 NOTE — Assessment & Plan Note (Signed)
See copd/ resp failure

## 2015-10-10 NOTE — Assessment & Plan Note (Signed)
HCO3  07/23/15  = 30 c/w only mild hypercarbia  Up to 33 10/09/2015 02 2lpm hs and prn daytime  as of 10/05/2015   May need to consider noct NIV

## 2015-10-10 NOTE — ED Notes (Signed)
Left foot pain x 3 days, last night noticed some swelling. Did not injure area, is not a diabetic. Left ankle mildly swollen, pulse movement sensation intact in left foot. Denies hx of gout.

## 2015-10-11 ENCOUNTER — Other Ambulatory Visit: Payer: Self-pay | Admitting: Internal Medicine

## 2015-10-11 MED ORDER — PANTOPRAZOLE SODIUM 40 MG PO TBEC: DELAYED_RELEASE_TABLET | ORAL | Status: DC

## 2015-10-11 MED ORDER — FAMOTIDINE 20 MG PO TABS: 20.0000 mg | ORAL_TABLET | Freq: Every day | ORAL | Status: DC

## 2015-10-11 NOTE — Progress Notes (Signed)
Quick Note:  Spoke with pt and notified of results per Dr. Wert. Pt verbalized understanding and denied any questions.  ______ 

## 2015-10-15 ENCOUNTER — Ambulatory Visit: Payer: BLUE CROSS/BLUE SHIELD | Admitting: Internal Medicine

## 2015-11-05 ENCOUNTER — Ambulatory Visit: Payer: BLUE CROSS/BLUE SHIELD | Admitting: Internal Medicine

## 2015-11-05 ENCOUNTER — Other Ambulatory Visit: Payer: Self-pay | Admitting: Internal Medicine

## 2015-11-19 ENCOUNTER — Ambulatory Visit (INDEPENDENT_AMBULATORY_CARE_PROVIDER_SITE_OTHER): Payer: BLUE CROSS/BLUE SHIELD | Admitting: Internal Medicine

## 2015-11-19 ENCOUNTER — Encounter: Payer: Self-pay | Admitting: Internal Medicine

## 2015-11-19 ENCOUNTER — Other Ambulatory Visit (INDEPENDENT_AMBULATORY_CARE_PROVIDER_SITE_OTHER): Payer: BLUE CROSS/BLUE SHIELD | Admitting: *Deleted

## 2015-11-19 VITALS — BP 112/64 | HR 95 | Temp 98.1°F | Resp 12 | Wt 349.0 lb

## 2015-11-19 DIAGNOSIS — E1165 Type 2 diabetes mellitus with hyperglycemia: Secondary | ICD-10-CM | POA: Diagnosis not present

## 2015-11-19 DIAGNOSIS — E282 Polycystic ovarian syndrome: Secondary | ICD-10-CM | POA: Diagnosis not present

## 2015-11-19 DIAGNOSIS — E039 Hypothyroidism, unspecified: Secondary | ICD-10-CM

## 2015-11-19 LAB — POCT GLYCOSYLATED HEMOGLOBIN (HGB A1C): HEMOGLOBIN A1C: 8.5

## 2015-11-19 NOTE — Patient Instructions (Addendum)
Continue Levothyroxine 224 mcg daily.  Take the thyroid hormone every day, with water, at least 30 minutes before breakfast, separated by at least 4 hours from: - acid reflux medications - calcium - iron - multivitamins  Continue to check sugars at home -  2x day.  Please come back for a follow-up appointment in 2 months.  Please consider the following ways to cut down carbs and fat and increase fiber and micronutrients in your diet: - substitute whole grain for white bread or pasta - substitute brown rice for white rice - substitute 90-calorie flat bread pieces for slices of bread when possible - substitute sweet potatoes or yams for white potatoes - substitute humus for margarine - substitute tofu for cheese when possible - substitute almond or rice milk for regular milk (would not drink soy milk daily due to concern for soy estrogen influence on breast cancer risk) - substitute dark chocolate for other sweets when possible - substitute water - can add lemon or orange slices for taste - for diet sodas (artificial sweeteners will trick your body that you can eat sweets without getting calories and will lead you to overeating and weight gain in the long run) - do not skip breakfast or other meals (this will slow down the metabolism and will result in more weight gain over time)  - can try smoothies made from fruit and almond/rice milk in am instead of regular breakfast - can also try old-fashioned (not instant) oatmeal made with almond/rice milk in am - order the dressing on the side when eating salad at a restaurant (pour less than half of the dressing on the salad) - eat as little meat as possible - can try juicing, but should not forget that juicing will get rid of the fiber, so would alternate with eating raw veg./fruits or drinking smoothies - use as little oil as possible, even when using olive oil - can dress a salad with a mix of balsamic vinegar and lemon juice, for e.g. - use  agave nectar, stevia sugar, or regular sugar rather than artificial sweateners - steam or broil/roast veggies  - snack on veggies/fruit/nuts (unsalted, preferably) when possible, rather than processed foods - reduce or eliminate aspartame in diet (it is in diet sodas, chewing gum, etc) Read the labels!  Try to read Dr. Janene Harvey book: "Program for Reversing Diabetes" for other ideas for healthy eating.

## 2015-11-19 NOTE — Progress Notes (Signed)
Patient ID: Autumn Massey, female   DOB: February 16, 1966, 50 y.o.   MRN: [id number]   HPI  Autumn Massey is a 50 y.o.-year-old female, returning for f/u for DM2, hypothyroidism and PCOS. Last visit 1 year ago.  She had PNA in 05/2015 >> hospitalized for 5 days >> d/c on oxygen >> asthmatic bronchitis >> started Protonix in am, Famotidine at night.  If she moves, oxygen drops in the 80's. She is on inhalers.  She quit smoking in 05/2015.  PCOS: Reviewed hx:  - weight gain: Maximal weight 364  - was on Phentermine, which worked initially, then stopped working  - Would consider gastric bypass but only as a last resort - + hirsutism >> started to see facial hair in her 30s - + hair loss >> female pattern, and bald spot in vertex - no acne - + fatigue - had a Mirena IUD placed 04/2013 for heavy menstrual cycles  - had a steroid injection x 1 - last year (knee), and has had Prednisone tapers in last 3 years.  We started Spironolactone >> 50 mg bid, but potassium increased >> we had to decrease to 25 mg bid. Since she is on Spironolactone, which blocks the testosterone receptors >> no repeat testosterone levels needed.  Testosterone was high >> (2015) indicating PCOS  Component     Latest Ref Rng 10/18/2013  Testosterone     10 - 70 ng/dL 78 (H)  Sex Hormone Binding     18 - 114 nmol/L 28  Testosterone Free     0.6 - 6.8 pg/mL 15.6 (H)  Testosterone-% Free     0.4 - 2.4 % 2.0  Androstenedione      93  DHEA-SO4     35 - 430 ug/dL 35    We started Spironolactone. Hair is thickening on Spironolactone. Last potassium: Lab Results  Component Value Date   K 4.3 10/09/2015   Hypothyroidism: Pt. has been dx with hypothyroidism in 2005; is on Levothyroxine 112 mcg x2 a day, taken: - fasting - in am - with water or tea - with all the other meds - separated by >60 min from b'fast  - no calcium, iron, PPIs, multivitamins   I reviewed pt's thyroid tests: Lab Results  Component Value  Date   TSH 2.25 07/23/2015   TSH 1.56 11/22/2014   TSH 1.692 05/24/2014   TSH 0.33* 03/08/2014   TSH 0.33* 01/27/2014   TSH 1.91 01/10/2014   TSH 4.88* 11/29/2013   TSH 10.04* 10/18/2013   TSH 9.58* 08/11/2013   TSH 4.15 03/15/2012   FREET4 1.08 07/23/2015   FREET4 0.91 11/22/2014   FREET4 1.21 05/24/2014   FREET4 1.05 03/08/2014   FREET4 1.38 01/27/2014   FREET4 1.16 01/10/2014   FREET4 0.86 11/29/2013   FREET4 0.75 10/18/2013    Pt denies feeling nodules in neck, hoarseness, dysphagia/odynophagia, SOB with lying down.  She is on neurontin 3x a day (for tremors) but had to decrease to 2x a day today b/c somnolence.  She is on Tegretol to help with the tremors.  She has been tested for Cushing's sd. In 02/2012 by a Dexamethasone suppression test >> cortisol returned at 1.4 (normal). She also had normal urine CA and MN (testing for 2nd HTN) >> no pheochromocytoma.  DM2: - last HbA1c higher after her hospitalization >> steroids (sugars 400-500) - now CBGs slowly coming down: Lab Results  Component Value Date   HGBA1C 7.7* 07/23/2015   HGBA1C 6.3 11/22/2014  HGBA1C 6.7* 03/07/2014   Not on meds.  Checks sugars at home: - am: 90-110 - after meals: 143-171   ROS: Constitutional: see HPI Eyes: no blurry vision, no xerophthalmia ENT: no sore throat, no nodules palpated in throat, no dysphagia/odynophagia, no hoarseness Cardiovascular: no CP/SOB/palpitations/leg swelling Respiratory: no cough/SOB/wheezing Gastrointestinal: no N/V/D/C Musculoskeletal: no muscle/joint aches, + leg cramps Skin: + rashes (legs) - stasis dermatitis Neurological: + tremors/no numbness/tingling/dizziness  PE: BP 112/64 mmHg  Pulse 95  Temp(Src) 98.1 F (36.7 C) (Oral)  Resp 12  Wt 349 lb (158.305 kg)  SpO2 90% Body mass index is 63.82 kg/(m^2). Wt Readings from Last 3 Encounters:  11/19/15 349 lb (158.305 kg)  10/09/15 354 lb (160.573 kg)  10/05/15 353 lb 6.4 oz (160.301 kg)    Constitutional: obesity class 3 (see below), in NAD, + full subclavic. fat pads Eyes: PERRLA, EOMI, no exophthalmos ENT: moist mucous membranes, no thyromegaly, no cervical lymphadenopathy Cardiovascular: RRR, No MRG Respiratory: CTA B Gastrointestinal: abdomen soft, NT, ND, BS+ Musculoskeletal: no deformities, strength intact in all 4 Skin: moist, warm, + rash: stasis dermatitis on legs, + Hirsutism (shaves) on chin; + skin tags Neurological: + B tremor with outstretched hands, DTR normal in all 4  BMI Classification:  < 18.5 underweight   18.5-24.9 normal weight   25.0-29.9 overweight   30.0-34.9 class I obesity   35.0-39.9 class II obesity   ? 40.0 class III obesity   ASSESSMENT: 1. Hypothyroidism  2. Weight gain  Component     Latest Ref Rng 10/25/2013  Cortisol (Ur), Free     4.0 - 50.0 mcg/24 h 26.4  Results received     0.63 - 2.50 g/24 h 1.83  Creatinine, Urine      59.9  Creatinine, 24H Ur     700 - 1800 mg/day 1798   3. PCOS  4. DM2   PLAN:  1. Patient with long-standing hypothyroidism, on levothyroxine therapy. Since last visit, due to her worsening respiratory status, she was started on PPIs and H2 blockers. Unfortunately, she is taking her PPIs with levothyroxine, so I'm afraid she is not absorbing her thyroid hormone. I strongly advised her to move PPIs for hours after levothyroxine. I would not check her thyroid tests today, will check at next visit. - We again  discussed about correct intake of levothyroxine, fasting, with water, separated by at least 30 minutes from breakfast, and separated by more than 4 hours from calcium, iron, multivitamins, acid reflux medications (PPIs). I also gave her written instructions with the above directions. - She does not appear to have a goiter, thyroid nodules, or neck compression symptoms - I will see her back in 6 weeks  2. Weight gain - Gained 15 pounds overall since last visit, but she has issues with fluid  retention - We discussed about weight loss - We again discussed about diet choices - emphasized the need for a low fat diet. I gave her written references (see patient instructions) - we r/o Cushing's sd. 09/2013 by a normal 24h UFC; 02/2012 DST was normal  3. PCOS - cannot check LH and FSH since on hormonal IUD - She is on Spironolactone >> her body hair has decreased, however, but still terminal hair on her chin. We could not increase the dose higher than 25 mg twice a day due to increased potassium (5.4) >>   continue 25 mg 2x a day. I reviewed her latest potassium level, and this was normal, last month.  -  No need to repeat the testosterone level since on spironolactone which blocks the testosterone receptors  4. DM2 - We reviewed together her previous HbA1c which was much higher, due to steroids in the hospital and her general illness. - We discussed about the fact that she can keep the diabetes under control with diet and exercise along with weight loss and keeping her sugars in check. However, I also suggested that she look into gastric bypass surgery. I'm not sure if she would be a good candidate for surgery due to to her respiratory status... At least not at the moment. I gave her sugar log and advised her to check every day again at different times of the day. We also discussed about CBG targets.  - We may need to add metformin in the future  - We checked a hemoglobin A1c today >> 8.5%, higher than before  - time spent with the patient: 40 min, of which >50% was spent in counseling about her acute and chronic conditions and about nutrition in general - see discussed topics above

## 2015-11-27 ENCOUNTER — Other Ambulatory Visit: Payer: Self-pay | Admitting: Neurology

## 2015-11-27 ENCOUNTER — Other Ambulatory Visit: Payer: Self-pay | Admitting: Internal Medicine

## 2015-11-28 ENCOUNTER — Other Ambulatory Visit: Payer: Self-pay | Admitting: Internal Medicine

## 2015-11-28 MED ORDER — PANTOPRAZOLE SODIUM 40 MG PO TBEC: DELAYED_RELEASE_TABLET | ORAL | Status: DC

## 2015-11-28 NOTE — Telephone Encounter (Signed)
Last OV: 07/23/15 Next OV: 0/0/0  1. Increase primidone to 125mg  at bedtime. She will call in 1 month with update.

## 2015-12-04 ENCOUNTER — Encounter: Payer: 59 | Admitting: Internal Medicine

## 2015-12-07 ENCOUNTER — Other Ambulatory Visit: Payer: Self-pay | Admitting: Internal Medicine

## 2015-12-07 MED ORDER — FAMOTIDINE 20 MG PO TABS: 20.0000 mg | ORAL_TABLET | Freq: Every day | ORAL | Status: DC

## 2015-12-20 ENCOUNTER — Encounter: Payer: Self-pay | Admitting: Internal Medicine

## 2015-12-20 ENCOUNTER — Ambulatory Visit (INDEPENDENT_AMBULATORY_CARE_PROVIDER_SITE_OTHER): Payer: Self-pay | Admitting: Internal Medicine

## 2015-12-20 VITALS — BP 132/64 | HR 90 | Ht 62.0 in | Wt 337.8 lb

## 2015-12-20 DIAGNOSIS — H9191 Unspecified hearing loss, right ear: Secondary | ICD-10-CM

## 2015-12-20 DIAGNOSIS — J45901 Unspecified asthma with (acute) exacerbation: Secondary | ICD-10-CM | POA: Insufficient documentation

## 2015-12-20 MED ORDER — LEVOFLOXACIN 500 MG PO TABS: 500.0000 mg | ORAL_TABLET | Freq: Every day | ORAL | Status: DC

## 2015-12-20 MED ORDER — PREDNISONE 10 MG PO TABS: ORAL_TABLET | ORAL | Status: DC

## 2015-12-20 NOTE — Patient Instructions (Addendum)
ICD-9-CM ICD-10-CM   1. Asthma with acute exacerbation, unspecified asthma severity 493.92 J45.901   2. Hearing loss, right 389.9 H91.91    STart breo low dose daily  Use albuterol as neded  START levaquin 500mg  once daily  X 5 days START and Take prednisone 40 mg daily x 2 days, then 20mg  daily x 2 days, then 10mg  daily x 2 days, then 5mg  daily x 2 days and stop  Followup - as before with Dr Melvyn Novas - if hearing not better, then go to ER or seek ENT help

## 2015-12-20 NOTE — Progress Notes (Signed)
Subjective:     Patient ID: Autumn Massey, female   DOB: April 03, 1966, 50 y.o.   MRN: [id number]  HPI    OV 12/20/2015  Chief Complaint  Patient presents with  . Acute Visit    MW pt here for prod cough with thick brown/green mucus (taking Mucinex), increased SOB, wheezing, right ear congestion/pain - went to UC on 6/19 and given Azithromycin 500mg , will finish tomorrow.  denies any fever, hemoptysis, CP    50 year old extremely morbidly obese female. She gives me the history and history is also obtain from review of the chart. As best as I can tell she tells me that late 2016 she was hospitalized for a prolonged period of time not otherwise specified at [facility] with pneumonia, volume overload acute on chronic hypoxemic respiratory failure. Prior to that she was only on 2 L nasal cannula oxygen at night. After that she was requiring continuous oxygen. Earlier in 2017 started seeing Dr. Melvyn Novas who was able to wean her off oxygen to her baseline of 2 L oxygen at night. She apparently was given a diagnosis of COPD at that time but after a few months of follow-up in trying different inhalers apparently Dr. Melvyn Novas has given her diagnosis of asthma which she now carries. Review of the chart shows that couple of visits ago in April 2017 she was advised to take stiolto (LABA/LAMA)which apparently helped immensely but she could not afford it and she has not been taking it for a few months.  Now for the past 3 weeks she's had increased cough and chest tightness and sputum production. Earlier in the course of the illness was given antibiotics and prednisone which helped but for now for the last few days after being off prednisone for 4 or 5 days she started having worsening cough and chest tightness associated with some colored sputum production and also acute right hearing loss Marlana Salvage has chronic hearing loss in the left side]. Last couple days she's been on azithromycin given by primary care physician but  this has not helped. She is on no maintenance inhalers at this point in time is also having increased wheezing. There is no fever or hemoptysis or change in edema that is baseline     Baseline lung function: 09/03/15 TLC 56% (Body mass index is 61.77 kg/(m^2).) Body mass index is 61.77 kg/(m^2).  -dlco 73%  CXR 10/10/15 0 clear but ltd by Body mass index is 61.77 kg/(m^2).  VQ 10/10/15  - Low prob Duplex LE 10/10/15 - no DVT BAseline High bic 30-33 per Dr Melvyn Novas notes - with consideration fo NIV per April 2017 note   has a past medical history of ANXIETY DEPRESSION (01/29/2009); DEPRESSION (11/19/2007); FREQUENCY, URINARY (10/17/2009); HYPERLIPIDEMIA (11/19/2007); HYPERTENSION (11/19/2007); HYPOTHYROIDISM (11/19/2007); PNEUMONIA (08/06/2010); POLYCYTHEMIA (01/29/2009); Preventative health care (04/06/2011); SLEEP RELATED HYPOVENTILATION/HYPOXEMIA CCE (02/08/2009); Morbid obesity ([place]); Venous insufficiency (07/19/2012); and Sleep apnea.   reports that she quit smoking about 6 months ago. Her smoking use included Cigarettes. She has a 11 pack-year smoking history. She has never used smokeless tobacco.  Past Surgical History  Procedure Laterality Date  . Tonsillectomy    . Ulnar tunnel release Left 02/22/2013    Procedure: LEFT ULNAR NERVE DECOMPRESSION ;  Surgeon: Tennis Must, MD;  Location: [facility];  Service: Orthopedics;  Laterality: Left;    No Known Allergies  Immunization History  Administered Date(s) Administered  . Influenza Split 04/11/2011, 03/15/2012, 03/31/2015  . Influenza Whole 02/20/2010  . Influenza,inj,Quad PF,36+  Mos 08/11/2013, 03/07/2014  . Pneumococcal Conjugate-13 11/22/2014  . Td 01/29/2009    Family History  Problem Relation Age of Onset  . Cancer Mother     melanoma   . Cancer Maternal Grandfather     prostate cancer  . Alcohol abuse Other   . Arthritis Other   . Hypertension Other      Current outpatient prescriptions:  .  albuterol (PROVENTIL  HFA;VENTOLIN HFA) 108 (90 BASE) MCG/ACT inhaler, Inhale 2 puffs into the lungs every 6 (six) hours as needed for wheezing or shortness of breath., Disp: , Rfl:  .  aspirin EC 81 MG tablet, Take 81 mg by mouth daily., Disp: , Rfl:  .  bisoprolol (ZEBETA) 5 MG tablet, Take 1 tablet (5 mg total) by mouth daily., Disp: 30 tablet, Rfl: 11 .  clonazePAM (KLONOPIN) 0.5 MG tablet, Take 1 tablet (0.5 mg total) by mouth 2 (two) times daily as needed for anxiety., Disp: 30 tablet, Rfl: 2 .  famotidine (PEPCID) 20 MG tablet, Take 1 tablet (20 mg total) by mouth at bedtime., Disp: 30 tablet, Rfl: 2 .  FLUoxetine (PROZAC) 20 MG capsule, Take 1 capsule (20 mg total) by mouth daily. Keep July appt for future refills, Disp: 30 capsule, Rfl: 0 .  furosemide (LASIX) 40 MG tablet, Take 1 tablet (40 mg total) by mouth daily. Must keep July appt for future refills, Disp: 30 tablet, Rfl: 1 .  glucose blood test strip, Use to test blood sugar 1 time daily as instructed., Disp: 100 each, Rfl: 3 .  hydrochlorothiazide (HYDRODIURIL) 25 MG tablet, TAKE ONE TABLET BY MOUTH EVERY DAY, Disp: 30 tablet, Rfl: 5 .  irbesartan (AVAPRO) 300 MG tablet, TAKE ONE TABLET BY MOUTH AT BEDTIME, Disp: 90 tablet, Rfl: 3 .  levothyroxine (SYNTHROID, LEVOTHROID) 112 MCG tablet, TAKE TWO TABLETS BY MOUTH EVERY MORNING BEFORE BREAKFAST, Disp: 60 tablet, Rfl: 1 .  NON FORMULARY, Oxygen 2L use as bedtime, Disp: , Rfl:  .  pantoprazole (PROTONIX) 40 MG tablet, 1 tablet 30-60 min before first meal of the day, Disp: 30 tablet, Rfl: 2 .  pregabalin (LYRICA) 75 MG capsule, Take 1 capsule (75 mg total) by mouth 2 (two) times daily., Disp: 180 capsule, Rfl: 0 .  primidone (MYSOLINE) 50 MG tablet, Take 2.5 tablets (125 mg total) by mouth at bedtime. Please schedule f/u, Disp: 75 tablet, Rfl: 2 .  simvastatin (ZOCOR) 40 MG tablet, Take 1 tablet (40 mg total) by mouth at bedtime. Keep July appt for future refills, Disp: 30 tablet, Rfl: 0 .  spironolactone  (ALDACTONE) 25 MG tablet, TAKE ONE TABLET BY MOUTH TWICE DAILY .** FOLLOW UP appointment needed FOR FURTHER REFILLS**], Disp: 60 tablet, Rfl: 0 .  Tiotropium Bromide-Olodaterol (STIOLTO RESPIMAT) 2.5-2.5 MCG/ACT AERS, Inhale 2 puffs into the lungs daily., Disp: 1 Inhaler, Rfl: 11    Review of Systems     Objective:   Physical Exam  Constitutional: She is oriented to person, place, and time. She appears well-developed and well-nourished. No distress.  Body mass index is 61.77 kg/(m^2). Morbid obesity  HENT:  Head: Normocephalic and atraumatic.  Right Ear: External ear normal.  Left Ear: External ear normal.  Mouth/Throat: Oropharynx is clear and moist. No oropharyngeal exudate.  mallmaptti class 4 Rtt ear red drum with some loss of cone of light +  Eyes: Conjunctivae and EOM are normal. Pupils are equal, round, and reactive to light. Right eye exhibits no discharge. Left eye exhibits no discharge. No scleral  icterus.  Neck: Normal range of motion. Neck supple. No JVD present. No tracheal deviation present. No thyromegaly present.  Cardiovascular: Normal rate, regular rhythm, normal heart sounds and intact distal pulses.  Exam reveals no gallop and no friction rub.   No murmur heard. Pulmonary/Chest: Effort normal. No respiratory distress. She has wheezes. She has no rales. She exhibits no tenderness.  Scattered wheeze +  Abdominal: Soft. Bowel sounds are normal. She exhibits no distension and no mass. There is no tenderness. There is no rebound and no guarding.  Visceral oesity+  Musculoskeletal: Normal range of motion. She exhibits no edema or tenderness.  Lymphadenopathy:    She has no cervical adenopathy.  Neurological: She is alert and oriented to person, place, and time. She has normal reflexes. No cranial nerve deficit. She exhibits normal muscle tone. Coordination normal.  Skin: Skin is warm and dry. No rash noted. She is not diaphoretic. No erythema. No pallor.  Psychiatric:  She has a normal mood and affect. Her behavior is normal. Judgment and thought content normal.  Vitals reviewed.   Filed Vitals:   12/20/15 1424  BP: 132/64  Pulse: 90  Height: 5\' 2"  (1.575 m)  Weight: 337 lb 12.8 oz (153.225 kg)  SpO2: 94%        Assessment:       ICD-9-CM ICD-10-CM   1. Asthma with acute exacerbation, unspecified asthma severity 493.92 J45.901   2. Hearing loss, right 389.9 H91.91    RT ear with likely otitis    Plan:       STart breo low dose daily  Use albuterol as neded  START levaquin 500mg  once daily  X 5 days START and Take prednisone 40 mg daily x 2 days, then 20mg  daily x 2 days, then 10mg  daily x 2 days, then 5mg  daily x 2 days and stop  Followup - as before with Dr Melvyn Novas - if hearing not better, then go to ER or seek ENT help   Dr. Brand Males, M.D., [facility].C.P Pulmonary and Critical Care Medicine Staff Physician [place] [facility] Pager: [phone], If no answer or between  15:00h - 7:00h: call [phone]  12/20/2015 2:58 PM

## 2015-12-24 MED ORDER — FLUTICASONE FUROATE-VILANTEROL 100-25 MCG/INH IN AEPB: 1.0000 | INHALATION_SPRAY | Freq: Every day | RESPIRATORY_TRACT | Status: DC

## 2015-12-24 NOTE — Addendum Note (Signed)
Addended by: Parke Poisson E on: 12/24/2015 09:19 AM   Modules accepted: Orders

## 2015-12-27 ENCOUNTER — Other Ambulatory Visit: Payer: Self-pay | Admitting: Internal Medicine

## 2015-12-28 ENCOUNTER — Emergency Department (HOSPITAL_COMMUNITY): Payer: Self-pay

## 2015-12-28 ENCOUNTER — Encounter (HOSPITAL_COMMUNITY): Payer: Self-pay

## 2015-12-28 ENCOUNTER — Other Ambulatory Visit: Payer: Self-pay | Admitting: Internal Medicine

## 2015-12-28 ENCOUNTER — Emergency Department (HOSPITAL_COMMUNITY)
Admission: EM | Admit: 2015-12-28 | Discharge: 2015-12-28 | Disposition: A | Discharge status: Home / Self Care | Payer: Self-pay | Attending: Emergency Medicine | Admitting: Emergency Medicine

## 2015-12-28 DIAGNOSIS — R059 Cough, unspecified: Secondary | ICD-10-CM

## 2015-12-28 DIAGNOSIS — J45901 Unspecified asthma with (acute) exacerbation: Secondary | ICD-10-CM | POA: Insufficient documentation

## 2015-12-28 DIAGNOSIS — Z79899 Other long term (current) drug therapy: Secondary | ICD-10-CM | POA: Insufficient documentation

## 2015-12-28 DIAGNOSIS — Z7952 Long term (current) use of systemic steroids: Secondary | ICD-10-CM | POA: Insufficient documentation

## 2015-12-28 DIAGNOSIS — Z7951 Long term (current) use of inhaled steroids: Secondary | ICD-10-CM | POA: Insufficient documentation

## 2015-12-28 DIAGNOSIS — Z7982 Long term (current) use of aspirin: Secondary | ICD-10-CM | POA: Insufficient documentation

## 2015-12-28 DIAGNOSIS — F329 Major depressive disorder, single episode, unspecified: Secondary | ICD-10-CM | POA: Insufficient documentation

## 2015-12-28 DIAGNOSIS — R05 Cough: Secondary | ICD-10-CM

## 2015-12-28 DIAGNOSIS — E039 Hypothyroidism, unspecified: Secondary | ICD-10-CM | POA: Insufficient documentation

## 2015-12-28 DIAGNOSIS — Z87891 Personal history of nicotine dependence: Secondary | ICD-10-CM | POA: Insufficient documentation

## 2015-12-28 DIAGNOSIS — E785 Hyperlipidemia, unspecified: Secondary | ICD-10-CM | POA: Insufficient documentation

## 2015-12-28 DIAGNOSIS — I10 Essential (primary) hypertension: Secondary | ICD-10-CM | POA: Insufficient documentation

## 2015-12-28 LAB — CBC WITH DIFFERENTIAL/PLATELET
Basophils Absolute: 0 10*3/uL (ref 0.0–0.1)
Basophils Relative: 0 %
EOS PCT: 2 %
Eosinophils Absolute: 0.2 10*3/uL (ref 0.0–0.7)
HEMATOCRIT: 41.9 % (ref 36.0–46.0)
Hemoglobin: 13.5 g/dL (ref 12.0–15.0)
LYMPHS ABS: 2.4 10*3/uL (ref 0.7–4.0)
LYMPHS PCT: 21 %
MCH: 30.3 pg (ref 26.0–34.0)
MCHC: 32.2 g/dL (ref 30.0–36.0)
MCV: 93.9 fL (ref 78.0–100.0)
MONO ABS: 0.7 10*3/uL (ref 0.1–1.0)
MONOS PCT: 6 %
NEUTROS ABS: 8.3 10*3/uL — AB (ref 1.7–7.7)
Neutrophils Relative %: 71 %
PLATELETS: 207 10*3/uL (ref 150–400)
RBC: 4.46 MIL/uL (ref 3.87–5.11)
RDW: 14.3 % (ref 11.5–15.5)
WBC: 11.6 10*3/uL — ABNORMAL HIGH (ref 4.0–10.5)

## 2015-12-28 LAB — COMPREHENSIVE METABOLIC PANEL
ALT: 21 U/L (ref 14–54)
AST: 30 U/L (ref 15–41)
Albumin: 3.9 g/dL (ref 3.5–5.0)
Alkaline Phosphatase: 42 U/L (ref 38–126)
Anion gap: 12 (ref 5–15)
BILIRUBIN TOTAL: 0.7 mg/dL (ref 0.3–1.2)
BUN: 24 mg/dL — AB (ref 6–20)
CHLORIDE: 93 mmol/L — AB (ref 101–111)
CO2: 33 mmol/L — ABNORMAL HIGH (ref 22–32)
CREATININE: 0.87 mg/dL (ref 0.44–1.00)
Calcium: 9.2 mg/dL (ref 8.9–10.3)
Glucose, Bld: 137 mg/dL — ABNORMAL HIGH (ref 65–99)
POTASSIUM: 3.9 mmol/L (ref 3.5–5.1)
Sodium: 138 mmol/L (ref 135–145)
TOTAL PROTEIN: 7.1 g/dL (ref 6.5–8.1)

## 2015-12-28 LAB — I-STAT TROPONIN, ED: TROPONIN I, POC: 0 ng/mL (ref 0.00–0.08)

## 2015-12-28 LAB — D-DIMER, QUANTITATIVE (NOT AT ARMC)

## 2015-12-28 LAB — BRAIN NATRIURETIC PEPTIDE: B Natriuretic Peptide: 25.6 pg/mL (ref 0.0–100.0)

## 2015-12-28 MED ORDER — PREDNISONE 20 MG PO TABS
40.0000 mg | ORAL_TABLET | Freq: Once | ORAL | Status: AC
  Administered 2015-12-28: 40 mg via ORAL
  Filled 2015-12-28: qty 2

## 2015-12-28 MED ORDER — ALBUTEROL SULFATE HFA 108 (90 BASE) MCG/ACT IN AERS
2.0000 | INHALATION_SPRAY | Freq: Once | RESPIRATORY_TRACT | Status: AC
Start: 2015-12-28 — End: 2015-12-28
  Administered 2015-12-28: 2 via RESPIRATORY_TRACT
  Filled 2015-12-28: qty 6.7

## 2015-12-28 MED ORDER — PREDNISONE 10 MG PO TABS: 40.0000 mg | ORAL_TABLET | Freq: Every day | ORAL | Status: DC

## 2015-12-28 MED ORDER — IPRATROPIUM-ALBUTEROL 0.5-2.5 (3) MG/3ML IN SOLN
3.0000 mL | Freq: Once | RESPIRATORY_TRACT | Status: AC
  Administered 2015-12-28: 3 mL via RESPIRATORY_TRACT
  Filled 2015-12-28: qty 3

## 2015-12-28 NOTE — ED Provider Notes (Signed)
CSN: [id number]     Arrival date & time 12/28/15  1058 History   First MD Initiated Contact with Patient 12/28/15 1303     Chief Complaint  Patient presents with  . Shortness of Breath  . Cough     (Consider location/radiation/quality/duration/timing/severity/associated sxs/prior Treatment) HPI 50 year old female who presents with dyspnea and cough. She has a history of asthma, hypertension, hyperlipidemia and morbid obesity and sleep apnea on 2 L home oxygen. He states that 4 weeks ago developed cough, wheezing, shortness of breath, sore throat, and right ear otalgia. Initially was given a course of azithromycin, steroids, and breathing treatments. Initially improved but after taking medications had recurrence of symptoms. Return to urgent care and had a course of repeat steroids with Augmentin. Symptoms also initially improved but returned after taking antibiotics. She subsequently saw Dr. Chase Caller from pulmonology who started her on Levaquin and steroid taper. States that she finished her Levaquin 2 days ago and steroid taper yesterday. Over the past 24 hours has had recurrent nonproductive cough, chest tightness, wheezing and shortness of breath. No lower extremity edema. Normally sleeps upright with her oxygen and no new PND. No fevers, vomiting, diarrhea, abdominal pain.   Past Medical History  Diagnosis Date  . ANXIETY DEPRESSION 01/29/2009  . DEPRESSION 11/19/2007  . FREQUENCY, URINARY 10/17/2009  . HYPERLIPIDEMIA 11/19/2007  . HYPERTENSION 11/19/2007  . HYPOTHYROIDISM 11/19/2007  . PNEUMONIA 08/06/2010  . POLYCYTHEMIA 01/29/2009  . Preventative health care 04/06/2011  . SLEEP RELATED HYPOVENTILATION/HYPOXEMIA CCE 02/08/2009  . Morbid obesity ([place])   . Venous insufficiency 07/19/2012  . Sleep apnea     mild sleep apnea, on o2 at 2l at nighttime   Past Surgical History  Procedure Laterality Date  . Tonsillectomy    . Ulnar tunnel release Left 02/22/2013    Procedure: LEFT ULNAR NERVE  DECOMPRESSION ;  Surgeon: Tennis Must, MD;  Location: [place];  Service: Orthopedics;  Laterality: Left;   Family History  Problem Relation Age of Onset  . Cancer Mother     melanoma   . Cancer Maternal Grandfather     prostate cancer  . Alcohol abuse Other   . Arthritis Other   . Hypertension Other    Social History  Substance Use Topics  . Smoking status: Former Smoker -- 0.50 packs/day for 22 years    Types: Cigarettes    Quit date: 05/22/2015  . Smokeless tobacco: Never Used  . Alcohol Use: 0.0 oz/week    0 Standard drinks or equivalent per week     Comment: rare   OB History    No data available     Review of Systems 10/14 systems reviewed and are negative other than those stated in the HPI   Allergies  Review of patient's allergies indicates no known allergies.  Home Medications   Prior to Admission medications   Medication Sig Start Date End Date Taking? Authorizing Provider  albuterol (PROVENTIL HFA;VENTOLIN HFA) 108 (90 BASE) MCG/ACT inhaler Inhale 2 puffs into the lungs every 6 (six) hours as needed for wheezing or shortness of breath.   Yes Historical Provider, MD  aspirin EC 81 MG tablet Take 81 mg by mouth daily.   Yes Historical Provider, MD  bisoprolol (ZEBETA) 5 MG tablet Take 1 tablet (5 mg total) by mouth daily. 10/05/15  Yes Tanda Rockers, MD  clonazePAM (KLONOPIN) 0.5 MG tablet Take 1 tablet (0.5 mg total) by mouth 2 (two) times daily as needed for anxiety.  12/27/14  Yes Biagio Borg, MD  famotidine (PEPCID) 20 MG tablet Take 1 tablet (20 mg total) by mouth at bedtime. 12/07/15  Yes Tanda Rockers, MD  FLUoxetine (PROZAC) 20 MG capsule Take 1 capsule (20 mg total) by mouth daily. Keep July appt for future refills 12/07/15  Yes Biagio Borg, MD  fluticasone furoate-vilanterol (BREO ELLIPTA) 100-25 MCG/INH AEPB Inhale 1 puff into the lungs daily. 12/24/15  Yes Brand Males, MD  furosemide (LASIX) 40 MG tablet Take 1 tablet (40 mg total)  by mouth daily. Must keep July appt for future refills 11/28/15  Yes Biagio Borg, MD  GARLIC PO Take 1 tablet by mouth daily.   Yes Historical Provider, MD  glucose blood test strip Use to test blood sugar 1 time daily as instructed. 05/29/15  Yes Philemon Kingdom, MD  hydrochlorothiazide (HYDRODIURIL) 25 MG tablet TAKE ONE TABLET BY MOUTH EVERY DAY 12/27/15  Yes Biagio Borg, MD  irbesartan (AVAPRO) 300 MG tablet TAKE ONE TABLET BY MOUTH at bedtime 12/28/15  Yes Biagio Borg, MD  levothyroxine (SYNTHROID, LEVOTHROID) 112 MCG tablet TAKE TWO TABLETS BY MOUTH EVERY MORNING BEFORE BREAKFAST 11/27/15  Yes Philemon Kingdom, MD  NON FORMULARY Oxygen 2L use as bedtime   Yes Historical Provider, MD  pantoprazole (PROTONIX) 40 MG tablet 1 tablet 30-60 min before first meal of the day Patient taking differently: Take 40 mg by mouth daily. 1 tablet 30-60 min before first meal of the day 11/28/15  Yes Tanda Rockers, MD  predniSONE (DELTASONE) 10 MG tablet 4tabs x2 days, then 2tabs x2 days, then 1tab x2 days, then 1/2 tab x2 days and stop 12/20/15  Yes Brand Males, MD  primidone (MYSOLINE) 50 MG tablet Take 2.5 tablets (125 mg total) by mouth at bedtime. Please schedule f/u Patient taking differently: Take 150 mg by mouth at bedtime.  11/28/15  Yes Adam Telford Nab, DO  simvastatin (ZOCOR) 40 MG tablet Take 1 tablet (40 mg total) by mouth at bedtime. Keep July appt for future refills 12/07/15  Yes Biagio Borg, MD  spironolactone (ALDACTONE) 25 MG tablet TAKE ONE TABLET BY MOUTH TWICE DAILY .** FOLLOW UP appointment needed FOR FURTHER REFILLS**] 11/05/15  Yes Philemon Kingdom, MD  levofloxacin (LEVAQUIN) 500 MG tablet Take 1 tablet (500 mg total) by mouth daily. Patient not taking: Reported on 12/28/2015 12/20/15   Brand Males, MD  predniSONE (DELTASONE) 10 MG tablet Take 4 tablets (40 mg total) by mouth daily. 12/29/15   Forde Dandy, MD  pregabalin (LYRICA) 75 MG capsule Take 1 capsule (75 mg total) by mouth 2 (two)  times daily. Patient not taking: Reported on 12/28/2015 10/01/15   Pieter Partridge, DO  Tiotropium Bromide-Olodaterol (STIOLTO RESPIMAT) 2.5-2.5 MCG/ACT AERS Inhale 2 puffs into the lungs daily. Patient not taking: Reported on 12/28/2015 10/05/15   Tanda Rockers, MD   BP 117/71 mmHg  Pulse 73  Temp(Src) 98.3 F (36.8 C) (Oral)  Resp 18  SpO2 96% Physical Exam Physical Exam  Nursing note and vitals reviewed. Constitutional: Well developed, well nourished, non-toxic, and in no acute distress Head: Normocephalic and atraumatic.  Mouth/Throat: Oropharynx is clear and moist.  Neck: Normal range of motion. Neck supple.  Cardiovascular: Normal rate and regular rhythm.   Pulmonary/Chest: Effort normal. Expiratory wheezing in all lung fields. Abdominal: Soft. There is no tenderness. There is no rebound and no guarding.  Musculoskeletal: Normal range of motion.  Neurological: Alert, no facial droop, fluent  speech, moves all extremities symmetrically Skin: Skin is warm and dry.  Psychiatric: Cooperative  ED Course  Procedures (including critical care time) Labs Review Labs Reviewed  CBC WITH DIFFERENTIAL/PLATELET - Abnormal; Notable for the following:    WBC 11.6 (*)    Neutro Abs 8.3 (*)    All other components within normal limits  COMPREHENSIVE METABOLIC PANEL - Abnormal; Notable for the following:    Chloride 93 (*)    CO2 33 (*)    Glucose, Bld 137 (*)    BUN 24 (*)    All other components within normal limits  BRAIN NATRIURETIC PEPTIDE  D-DIMER, QUANTITATIVE (NOT AT [facility])  Randolm Idol, ED    Imaging Review Dg Chest 2 View  12/28/2015  CLINICAL DATA:  Cough and wheezing.  Shortness of breath. EXAM: CHEST  2 VIEW COMPARISON:  October 10, 2015 FINDINGS: Stable cardiomegaly. The mediastinum, lungs, pleura, bones, and soft tissues are otherwise unremarkable. IMPRESSION: No active cardiopulmonary disease. Electronically Signed   By: Dorise Bullion III M.D   On: 12/28/2015 11:51   Ct  Chest Wo Contrast  12/28/2015  CLINICAL DATA:  Subjective fever, chills, shortness of breath. Cough. EXAM: CT CHEST WITHOUT CONTRAST TECHNIQUE: Multidetector CT imaging of the chest was performed following the standard protocol without IV contrast. COMPARISON:  Chest x-ray earlier today. FINDINGS: Cardiovascular: Heart is borderline in size. Aorta is normal caliber. Scattered arch calcifications and calcifications in the descending thoracic aorta. Mediastinum/Nodes: No mediastinal, hilar, or axillary adenopathy. Lungs/Pleura: No confluent airspace opacities or pleural effusions. No suspicious pulmonary nodules. Upper Abdomen: Mild diffuse fatty infiltration of the liver. No acute findings in the upper abdomen. Musculoskeletal: Chest wall soft tissues are unremarkable. No acute bony abnormality or focal bone lesion. IMPRESSION: No acute cardiopulmonary disease. Early aortic atherosclerosis. Electronically Signed   By: Rolm Baptise M.D.   On: 12/28/2015 16:30   I have personally reviewed and evaluated these images and lab results as part of my medical decision-making.   EKG Interpretation   Date/Time:  Friday December 28 2015 11:12:00 EDT Ventricular Rate:  81 PR Interval:    QRS Duration: 98 QT Interval:  411 QTC Calculation: 478 R Axis:   49 Text Interpretation:  Sinus rhythm Probable left atrial enlargement No  acute changes Confirmed by Price Lachapelle MD, Hinton Dyer [id number]) on 12/28/2015 1:51:54 PM      MDM   Final diagnoses:  Asthma exacerbation  Cough   49 year old female who presents with cough and sob. On presentation, she is nontoxic in no acute distress. Able to speak in full sentences, with normal oxygenation and normal work of breathing. Does have some diffuse expiratory wheezes on lung exam. A chest x-ray without infiltrate or edema. She has mild leukocytosis of 11, but has been taking steroids recently. EKG is not ischemic and without heart strain, troponin is negative. BNP is normal and not  suggestive of heart failure. A d-dimer is also negative and presentation not concerning for that a PE. Suspect persistent asthma exacerbation but did perform CT chest to r/o occult PNA given 3 courses of antibiotics with persistent symptoms. This is visualized and negative for any acute cardiopulmonary processes. Feels improved with repeat steroids and breathing treatments. Will discharge with short steroid burst, breathing treatments and f/u with her pulmonologist. Strict return and follow-up instructions reviewed. She/ expressed understanding of all discharge instructions and felt comfortable with the plan of care.     Forde Dandy, MD 12/28/15 1710

## 2015-12-28 NOTE — Discharge Instructions (Signed)
Please call your pulmonary doctor for close follow-up. Return for worsening symptoms, including worsening difficulty breathing, passing out, chest pain or any other symptoms concerning to you   Asthma, Acute Bronchospasm Acute bronchospasm caused by asthma is also referred to as an asthma attack. Bronchospasm means your air passages become narrowed. The narrowing is caused by inflammation and tightening of the muscles in the air tubes (bronchi) in your lungs. This can make it hard to breathe or cause you to wheeze and cough. CAUSES Possible triggers are:  Animal dander from the skin, hair, or feathers of animals.  Dust mites contained in house dust.  Cockroaches.  Pollen from trees or grass.  Mold.  Cigarette or tobacco smoke.  Air pollutants such as dust, household cleaners, hair sprays, aerosol sprays, paint fumes, strong chemicals, or strong odors.  Cold air or weather changes. Cold air may trigger inflammation. Winds increase molds and pollens in the air.  Strong emotions such as crying or laughing hard.  Stress.  Certain medicines such as aspirin or beta-blockers.  Sulfites in foods and drinks, such as dried fruits and wine.  Infections or inflammatory conditions, such as a flu, cold, or inflammation of the nasal membranes (rhinitis).  Gastroesophageal reflux disease (GERD). GERD is a condition where stomach acid backs up into your esophagus.  Exercise or strenuous activity. SIGNS AND SYMPTOMS   Wheezing.  Excessive coughing, particularly at night.  Chest tightness.  Shortness of breath. DIAGNOSIS  Your health care provider will ask you about your medical history and perform a physical exam. A chest X-ray or blood testing may be performed to look for other causes of your symptoms or other conditions that may have triggered your asthma attack. TREATMENT  Treatment is aimed at reducing inflammation and opening up the airways in your lungs. Most asthma attacks are  treated with inhaled medicines. These include quick relief or rescue medicines (such as bronchodilators) and controller medicines (such as inhaled corticosteroids). These medicines are sometimes given through an inhaler or a nebulizer. Systemic steroid medicine taken by mouth or given through an IV tube also can be used to reduce the inflammation when an attack is moderate or severe. Antibiotic medicines are only used if a bacterial infection is present.  HOME CARE INSTRUCTIONS   Rest.  Drink plenty of liquids. This helps the mucus to remain thin and be easily coughed up. Only use caffeine in moderation and do not use alcohol until you have recovered from your illness.  Do not smoke. Avoid being exposed to secondhand smoke.  You play a critical role in keeping yourself in good health. Avoid exposure to things that cause you to wheeze or to have breathing problems.  Keep your medicines up-to-date and available. Carefully follow your health care provider's treatment plan.  Take your medicine exactly as prescribed.  When pollen or pollution is bad, keep windows closed and use an air conditioner or go to places with air conditioning.  Asthma requires careful medical care. See your health care provider for a follow-up as advised. If you are more than [redacted] weeks pregnant and you were prescribed any new medicines, let your obstetrician know about the visit and how you are doing. Follow up with your health care provider as directed.  After you have recovered from your asthma attack, make an appointment with your outpatient doctor to talk about ways to reduce the likelihood of future attacks. If you do not have a doctor who manages your asthma, make an appointment with  a primary care doctor to discuss your asthma. SEEK IMMEDIATE MEDICAL CARE IF:   You are getting worse.  You have trouble breathing. If severe, call your local emergency services (911 in the [place]  You develop chest pain or  discomfort.  You are vomiting.  You are not able to keep fluids down.  You are coughing up yellow, green, brown, or bloody sputum.  You have a fever and your symptoms suddenly get worse.  You have trouble swallowing. MAKE SURE YOU:   Understand these instructions.  Will watch your condition.  Will get help right away if you are not doing well or get worse.   This information is not intended to replace advice given to you by your health care provider. Make sure you discuss any questions you have with your health care provider.   Document Released: 10/01/2006 Document Revised: 06/21/2013 Document Reviewed: 12/22/2012 Elsevier Interactive Patient Education [employer].

## 2015-12-28 NOTE — ED Notes (Addendum)
Pt c/o SOB, productive cough, and hoarseness x 4 weeks.  Pain score 4/10.  Pt reports chest tightness starting this morning.  Sts she has been on "4 rounds of antibiotics and prednisone."  Sts "I feel better when I'm on the medication, then it comes back."  Pt reports that she was recently started on a new inhaler.

## 2016-01-08 ENCOUNTER — Ambulatory Visit (INDEPENDENT_AMBULATORY_CARE_PROVIDER_SITE_OTHER): Payer: BLUE CROSS/BLUE SHIELD | Admitting: Internal Medicine

## 2016-01-08 VITALS — BP 140/80 | HR 94 | Temp 98.4°F | Resp 20 | Wt 341.0 lb

## 2016-01-08 DIAGNOSIS — I10 Essential (primary) hypertension: Secondary | ICD-10-CM

## 2016-01-08 DIAGNOSIS — E039 Hypothyroidism, unspecified: Secondary | ICD-10-CM

## 2016-01-08 DIAGNOSIS — J449 Chronic obstructive pulmonary disease, unspecified: Secondary | ICD-10-CM

## 2016-01-08 DIAGNOSIS — Z0001 Encounter for general adult medical examination with abnormal findings: Secondary | ICD-10-CM

## 2016-01-08 DIAGNOSIS — R6889 Other general symptoms and signs: Secondary | ICD-10-CM

## 2016-01-08 NOTE — Progress Notes (Signed)
Pre visit review using our clinic review tool, if applicable. No additional management support is needed unless otherwise documented below in the visit note. 

## 2016-01-08 NOTE — Progress Notes (Signed)
Subjective:    Patient ID: Autumn Massey, female    DOB: 1965/12/10, 50 y.o.   MRN: [id number]  HPI Here for wellness and f/u;  Overall doing ok;  Pt denies Chest pain, worsening SOB, DOE, wheezing, orthopnea, PND, worsening LE edema, palpitations, dizziness or syncope.  Pt denies neurological change such as new headache, facial or extremity weakness.  Pt denies polydipsia, polyuria, or low sugar symptoms. Pt states overall good compliance with treatment and medications, good tolerability, and has been trying to follow appropriate diet.  Pt denies worsening depressive symptoms, suicidal ideation or panic. No fever, night sweats, wt loss, loss of appetite, or other constitutional symptoms.  Pt states good ability with ADL's, has low fall risk, home safety reviewed and adequate, no other significant changes in hearing or vision, and only occasionally active with exercise. Has had trouble losing wt, gained a few lbs again with slightly higher sugars with recent prednisone.   Wt Readings from Last 3 Encounters:  01/08/16 341 lb (154.677 kg)  12/20/15 337 lb 12.8 oz (153.225 kg)  11/19/15 349 lb (158.305 kg)   S/p recent prednisone and antibx x 6 wks for bronchitis and right worsening hearing loss, , as well as recent asthma exacerbation - started 5 days prednisone jun 30, and that plus new Breo has seemed to help.  Right hearing 100% lost without amplifier now, used to be her good ear, since the left was 90% permanent lost.  Dyapnea, cough and wheezing overall improved.  No fever or productive.  Still has IUD, would like to have out, but procedure for placement was painful so she wants a new GYN - she plans to self refer.  Past Medical History  Diagnosis Date  . ANXIETY DEPRESSION 01/29/2009  . DEPRESSION 11/19/2007  . FREQUENCY, URINARY 10/17/2009  . HYPERLIPIDEMIA 11/19/2007  . HYPERTENSION 11/19/2007  . HYPOTHYROIDISM 11/19/2007  . PNEUMONIA 08/06/2010  . POLYCYTHEMIA 01/29/2009  . Preventative health  care 04/06/2011  . SLEEP RELATED HYPOVENTILATION/HYPOXEMIA CCE 02/08/2009  . Morbid obesity ([place])   . Venous insufficiency 07/19/2012  . Sleep apnea     mild sleep apnea, on o2 at 2l at nighttime   Past Surgical History  Procedure Laterality Date  . Tonsillectomy    . Ulnar tunnel release Left 02/22/2013    Procedure: LEFT ULNAR NERVE DECOMPRESSION ;  Surgeon: Tennis Must, MD;  Location: [place] [facility];  Service: Orthopedics;  Laterality: Left;    reports that she quit smoking about 7 months ago. Her smoking use included Cigarettes. She has a 11 pack-year smoking history. She has never used smokeless tobacco. She reports that she drinks alcohol. She reports that she does not use illicit drugs. family history includes Alcohol abuse in her other; Arthritis in her other; Cancer in her maternal grandfather and mother; Hypertension in her other. No Known Allergies Current Outpatient Prescriptions on File Prior to Visit  Medication Sig Dispense Refill  . albuterol (PROVENTIL HFA;VENTOLIN HFA) 108 (90 BASE) MCG/ACT inhaler Inhale 2 puffs into the lungs every 6 (six) hours as needed for wheezing or shortness of breath.    [name] aspirin EC 81 MG tablet Take 81 mg by mouth daily.    . bisoprolol (ZEBETA) 5 MG tablet Take 1 tablet (5 mg total) by mouth daily. 30 tablet 11  . clonazePAM (KLONOPIN) 0.5 MG tablet Take 1 tablet (0.5 mg total) by mouth 2 (two) times daily as needed for anxiety. 30 tablet 2  . famotidine (PEPCID) 20  MG tablet Take 1 tablet (20 mg total) by mouth at bedtime. 30 tablet 2  . FLUoxetine (PROZAC) 20 MG capsule Take 1 capsule (20 mg total) by mouth daily. Keep July appt for future refills 30 capsule 0  . fluticasone furoate-vilanterol (BREO ELLIPTA) 100-25 MCG/INH AEPB Inhale 1 puff into the lungs daily. 60 each 3  . furosemide (LASIX) 40 MG tablet Take 1 tablet (40 mg total) by mouth daily. Must keep July appt for future refills 30 tablet 1  . GARLIC PO Take 1 tablet by  mouth daily.    [name] glucose blood test strip Use to test blood sugar 1 time daily as instructed. 100 each 3  . hydrochlorothiazide (HYDRODIURIL) 25 MG tablet TAKE ONE TABLET BY MOUTH EVERY DAY 30 tablet 0  . irbesartan (AVAPRO) 300 MG tablet TAKE ONE TABLET BY MOUTH at bedtime 30 tablet 0  . levothyroxine (SYNTHROID, LEVOTHROID) 112 MCG tablet TAKE TWO TABLETS BY MOUTH EVERY MORNING BEFORE BREAKFAST 60 tablet 1  . NON FORMULARY Oxygen 2L use as bedtime    . pantoprazole (PROTONIX) 40 MG tablet 1 tablet 30-60 min before first meal of the day (Patient taking differently: Take 40 mg by mouth daily. 1 tablet 30-60 min before first meal of the day) 30 tablet 2  . primidone (MYSOLINE) 50 MG tablet Take 2.5 tablets (125 mg total) by mouth at bedtime. Please schedule f/u (Patient taking differently: Take 150 mg by mouth at bedtime. ) 75 tablet 2  . simvastatin (ZOCOR) 40 MG tablet Take 1 tablet (40 mg total) by mouth at bedtime. Keep July appt for future refills 30 tablet 0  . spironolactone (ALDACTONE) 25 MG tablet TAKE ONE TABLET BY MOUTH TWICE DAILY .** FOLLOW UP appointment needed FOR FURTHER REFILLS**] 60 tablet 0   No current facility-administered medications on file prior to visit.    Review of Systems Constitutional: Negative for increased diaphoresis, or other activity, appetite or siginficant weight change other than noted HENT: Negative for worsening hearing loss, ear pain, facial swelling, mouth sores and neck stiffness.   Eyes: Negative for other worsening pain, redness or visual disturbance.  Respiratory: Negative for choking or stridor Cardiovascular: Negative for other chest pain and palpitations.  Gastrointestinal: Negative for worsening diarrhea, blood in stool, or abdominal distention Genitourinary: Negative for hematuria, flank pain or change in urine volume.  Musculoskeletal: Negative for myalgias or other joint complaints.  Skin: Negative for other color change and wound or  drainage.  Neurological: Negative for syncope and numbness. other than noted Hematological: Negative for adenopathy. or other swelling Psychiatric/Behavioral: Negative for hallucinations, SI, self-injury, decreased concentration or other worsening agitation.      Objective:   Physical Exam BP 140/80 mmHg  Pulse 94  Temp(Src) 98.4 F (36.9 C) (Oral)  Resp 20  Wt 341 lb (154.677 kg)  SpO2 92% VS noted,  Constitutional: Pt is oriented to person, place, and time. Appears well-developed and well-nourished, in no significant distress Head: Normocephalic and atraumatic  Eyes: Conjunctivae and EOM are normal. Pupils are equal, round, and reactive to light Right Ear: External ear normal.  Left Ear: External ear normal Nose: Nose normal.  Mouth/Throat: Oropharynx is clear and moist  Neck: Normal range of motion. Neck supple. No JVD present. No tracheal deviation present or significant neck LA or mass Cardiovascular: Normal rate, regular rhythm, normal heart sounds and intact distal pulses.   Pulmonary/Chest: Effort normal and breath sounds without rales or wheezing  Abdominal: Soft. Bowel sounds are  normal. NT. No HSM  Musculoskeletal: Normal range of motion. Exhibits no edema Lymphadenopathy: Has no cervical adenopathy.  Neurological: Pt is alert and oriented to person, place, and time. Pt has normal reflexes. No cranial nerve deficit. Motor grossly intact Skin: Skin is warm and dry. No rash noted or new ulcers; has left upper lateral arm enlarging soft nontender ? Cystic mass, Psychiatric:  Has 1+ nervous mood and affect. Behavior is normal.     Assessment & Plan:

## 2016-01-08 NOTE — Patient Instructions (Signed)
Please continue all other medications as before, and refills have been done if requested.  Please have the pharmacy call with any other refills you may need.  Please continue your efforts at being more active, low cholesterol diet, and weight control.  You are otherwise up to date with prevention measures today.  Please keep your appointments with your specialists as you may have planned  Please return in 6 months, or sooner if needed 

## 2016-01-15 NOTE — Assessment & Plan Note (Signed)

## 2016-01-15 NOTE — Assessment & Plan Note (Signed)
Lab Results  Component Value Date   TSH 2.25 07/23/2015   stable overall by history and exam, recent data reviewed with pt, and pt to continue medical treatment as before,  to f/u any worsening symptoms or concerns

## 2016-01-15 NOTE — Assessment & Plan Note (Signed)
stable overall by history and exam, recent data reviewed with pt, and pt to continue medical treatment as before,  to f/u any worsening symptoms or concerns SpO2 Readings from Last 3 Encounters:  01/08/16 92%  12/28/15 94%  12/20/15 94%

## 2016-01-15 NOTE — Assessment & Plan Note (Signed)
stable overall by history and exam, recent data reviewed with pt, and pt to continue medical treatment as before,  to f/u any worsening symptoms or concerns BP Readings from Last 3 Encounters:  01/08/16 140/80  12/28/15 153/70  12/20/15 132/64

## 2016-01-17 ENCOUNTER — Other Ambulatory Visit: Payer: Self-pay | Admitting: Internal Medicine

## 2016-02-20 ENCOUNTER — Other Ambulatory Visit: Payer: Self-pay | Admitting: *Deleted

## 2016-02-20 MED ORDER — PANTOPRAZOLE SODIUM 40 MG PO TBEC
DELAYED_RELEASE_TABLET | ORAL | 2 refills | Status: DC
Start: 2016-02-20 — End: 2016-10-14

## 2016-02-22 ENCOUNTER — Other Ambulatory Visit: Payer: Self-pay | Admitting: Internal Medicine

## 2016-02-22 MED ORDER — FAMOTIDINE 20 MG PO TABS: 20.0000 mg | ORAL_TABLET | Freq: Every day | ORAL | 2 refills | Status: DC

## 2016-03-06 ENCOUNTER — Other Ambulatory Visit: Payer: Self-pay | Admitting: Internal Medicine

## 2016-03-06 ENCOUNTER — Other Ambulatory Visit: Payer: Self-pay | Admitting: Neurology

## 2016-03-19 ENCOUNTER — Other Ambulatory Visit: Payer: Self-pay | Admitting: Internal Medicine

## 2016-03-19 NOTE — Telephone Encounter (Signed)
Patient need a refill of levothyroxine (SYNTHROID, LEVOTHROID) [facility] ([address]), [place] [phone] (Phone) [phone] (Fax)

## 2016-03-20 ENCOUNTER — Other Ambulatory Visit: Payer: Self-pay

## 2016-03-20 NOTE — Telephone Encounter (Signed)
Medication requested was sent in yesterday 02/17/16.

## 2016-04-02 ENCOUNTER — Other Ambulatory Visit: Payer: Self-pay | Admitting: Internal Medicine

## 2016-04-03 NOTE — Telephone Encounter (Signed)
prozac done erx 

## 2016-04-22 ENCOUNTER — Other Ambulatory Visit: Payer: Self-pay | Admitting: Internal Medicine

## 2016-04-22 ENCOUNTER — Encounter: Payer: Self-pay | Admitting: Internal Medicine

## 2016-04-22 ENCOUNTER — Ambulatory Visit (INDEPENDENT_AMBULATORY_CARE_PROVIDER_SITE_OTHER): Payer: Self-pay | Admitting: Internal Medicine

## 2016-04-22 VITALS — BP 132/88 | HR 79 | Ht 61.5 in | Wt 334.0 lb

## 2016-04-22 DIAGNOSIS — E282 Polycystic ovarian syndrome: Secondary | ICD-10-CM

## 2016-04-22 DIAGNOSIS — E1165 Type 2 diabetes mellitus with hyperglycemia: Secondary | ICD-10-CM

## 2016-04-22 DIAGNOSIS — E039 Hypothyroidism, unspecified: Secondary | ICD-10-CM

## 2016-04-22 LAB — TSH: TSH: 9.96 u[IU]/mL — AB (ref 0.35–4.50)

## 2016-04-22 LAB — T4, FREE: FREE T4: 0.77 ng/dL (ref 0.60–1.60)

## 2016-04-22 LAB — POCT GLYCOSYLATED HEMOGLOBIN (HGB A1C): Hemoglobin A1C: 9

## 2016-04-22 MED ORDER — METFORMIN HCL 500 MG PO TABS: 1000.0000 mg | ORAL_TABLET | Freq: Two times a day (BID) | ORAL | 5 refills | Status: DC

## 2016-04-22 NOTE — Progress Notes (Signed)
Patient ID: Autumn Massey, female   DOB: 09/10/1965, 50 y.o.   MRN: [id number]   HPI  Autumn Massey is a 50 y.o.-year-old female, returning for f/u for DM2, hypothyroidism, and PCOS. Last visit 5 mo ago.  PCOS: Reviewed hx:  - weight gain: Maximal weight 364  - was on Phentermine, which worked initially, then stopped working  - Would consider gastric bypass but only as a last resort - + hirsutism >> started to see facial hair in her 30s - + hair loss >> female pattern, and bald spot in vertex - no acne - + fatigue - had a Mirena IUD placed 04/2013 for heavy menstrual cycles  - had a steroid injection x 1 - last year (knee), and has had Prednisone tapers in last 3 years.  We started Spironolactone >> 50 mg bid, but potassium increased >> we had to decrease to 25 mg bid. Since she is on Spironolactone, which blocks the testosterone receptors >> no repeat testosterone levels needed.  Testosterone was high >> (2015) indicating PCOS  Component     Latest Ref Rng 10/18/2013  Testosterone     10 - 70 ng/dL 78 (H)  Sex Hormone Binding     18 - 114 nmol/L 28  Testosterone Free     0.6 - 6.8 pg/mL 15.6 (H)  Testosterone-% Free     0.4 - 2.4 % 2.0  Androstenedione      93  DHEA-SO4     35 - 430 ug/dL 35    Last potassium: Lab Results  Component Value Date   K 3.9 12/28/2015   Hypothyroidism: Pt. has been dx with hypothyroidism in 2005; is on Levothyroxine 112 mcg x2 a day, taken: - fasting - in am - with water or tea - with all the other meds - separated by >60 min from b'fast  - no calcium, iron - stopped PPIs  - at last visit, she was taking these close to LT4 - no multivitamins   I reviewed pt's thyroid tests: Lab Results  Component Value Date   TSH 2.25 07/23/2015   TSH 1.56 11/22/2014   TSH 1.692 05/24/2014   TSH 0.33 (L) 03/08/2014   TSH 0.33 (L) 01/27/2014   TSH 1.91 01/10/2014   TSH 4.88 (H) 11/29/2013   TSH 10.04 (H) 10/18/2013   TSH 9.58 (H) 08/11/2013   TSH 4.15 03/15/2012   FREET4 1.08 07/23/2015   FREET4 0.91 11/22/2014   FREET4 1.21 05/24/2014   FREET4 1.05 03/08/2014   FREET4 1.38 01/27/2014   FREET4 1.16 01/10/2014   FREET4 0.86 11/29/2013   FREET4 0.75 10/18/2013    Pt denies feeling nodules in neck, hoarseness, dysphagia/odynophagia, SOB with lying down.  She is on neurontin 3x a day (for tremors) but had to decrease to 2x a day today b/c somnolence.  She is on Tegretol to help with the tremors. She also tried Primidone.   She has been tested for Cushing's sd. In 02/2012 by a Dexamethasone suppression test >> cortisol returned at 1.4 (normal). She also had normal urine CA and MN (testing for 2nd HTN) >> no pheochromocytoma.  DM2: - last HbA1c higher after her hospitalization >> steroids (sugars 400-500). - last Prednisone dose  - 12/2015  Reviewed HbA1c: Lab Results  Component Value Date   HGBA1C 8.5 11/19/2015   HGBA1C 7.7 (H) 07/23/2015   HGBA1C 6.3 11/22/2014   Not on meds.  Checks sugars at home: - am: 90-110 >> 97-121 - after lunch: 143-171 >>  147-187 (Prednisone) - after dinner: 140s-180  She quit smoking in 05/2015.  ROS: Constitutional: see HPI Eyes: no blurry vision, no xerophthalmia ENT: + sore throat, no nodules palpated in throat, no dysphagia/odynophagia, no hoarseness Cardiovascular: no CP/palpitations/leg swelling Respiratory: + cough/+ SOB/+ wheezing Gastrointestinal: no N/V/D/C Musculoskeletal: + muscle/+ joint aches, + leg cramps Skin: + rashes (legs) - stasis dermatitis Neurological: + tremors/no numbness/tingling/dizziness  PE: BP 132/88 (BP Location: Left Arm, Patient Position: Sitting)   Pulse 79   Ht 5' 1.5" (1.562 m)   Wt (!) 334 lb (151.5 kg)   SpO2 97%   BMI 62.09 kg/m  Body mass index is 62.09 kg/m. Wt Readings from Last 3 Encounters:  04/22/16 (!) 334 lb (151.5 kg)  01/08/16 (!) 341 lb (154.7 kg)  12/20/15 (!) 337 lb 12.8 oz (153.2 kg)   Constitutional: obesity class 3  (see below), in NAD, + full subclavic. fat pads Eyes: PERRLA, EOMI, no exophthalmos ENT: moist mucous membranes, no thyromegaly, no cervical lymphadenopathy Cardiovascular: RRR, No MRG Respiratory: CTA B Gastrointestinal: abdomen soft, NT, ND, BS+ Musculoskeletal: no deformities, strength intact in all 4 Skin: moist, warm, + rash: stasis dermatitis on legs, + Hirsutism (shaves) on chin; + skin tags Neurological: + B tremor with outstretched hands, DTR normal in all 4  BMI Classification:  < 18.5 underweight   18.5-24.9 normal weight   25.0-29.9 overweight   30.0-34.9 class I obesity   35.0-39.9 class II obesity   ? 40.0 class III obesity   ASSESSMENT: 1. Hypothyroidism  2. Weight gain  Component     Latest Ref Rng 10/25/2013  Cortisol (Ur), Free     4.0 - 50.0 mcg/24 h 26.4  Results received     0.63 - 2.50 g/24 h 1.83  Creatinine, Urine      59.9  Creatinine, 24H Ur     700 - 1800 mg/day 1798   3. PCOS  4. DM2   PLAN:  1. Patient with long-standing hypothyroidism, on levothyroxine therapy. T last visit, due to her worsening respiratory status, she was on PPIs and H2 blockers >> she was taking her PPIs with levothyroxine >> I strongly advised her to move PPIs for hours after levothyroxine. She stopped the PPIs and H2 blockers completely since last visit. - We'll check her TFTs today - We again  discussed about correct intake of levothyroxine, fasting, with water, separated by at least 30 minutes from breakfast, and separated by more than 4 hours from calcium, iron, multivitamins, acid reflux medications (PPIs). She is now taking it correctly. - She does not appear to have a goiter, thyroid nodules, or neck compression symptoms - I will see her back in 3 months  2. Weight gain - Lost 7 pounds since last visit - Her goal for now is to get under 300 pounds - we r/o Cushing's sd. 09/2013 by a normal 24h UFC; 02/2012 DST was normal  3. PCOS - cannot check LH and FSH  since on hormonal IUD - She is on Spironolactone >> her body hair has decreased, however, but still terminal hair on her chin. We could not increase the dose higher than 25 mg twice a day due to increased potassium (5.4) >>  continue 25 mg 2x a day. I reviewed her latest potassium level, and this was normal, 4 months ago. - No need to repeat the testosterone level since on spironolactone which blocks the testosterone receptors  4. DM2 - We reviewed together her previous HbA1c which was  higher, due to steroids in the hospital and her general illness. Unfortunately, he did not improved since last visit >> We checked a hemoglobin A1c today >> 9.0%, higher than before - We will add metformin today >> discussed about benefits and possible side effects  - Will recheck HbA1c at next visit  Orders Placed This Encounter  Procedures  . TSH  . T4, free   Component     Latest Ref Rng & Units 04/22/2016  Hemoglobin A1C      9.0  TSH     0.35 - 4.50 uIU/mL 9.96 (H)  T4,Free(Direct)     0.60 - 1.60 ng/dL 0.77   TSH is high, but it has been normal on the same dose of levothyroxine in the past. I will suggest to continue the current dose and recheck at next visit before changing the dose.  - time spent with the patient: 40 min, of which >50% was spent in counseling about her acute and chronic conditions and about nutrition in general - see discussed topics above   Philemon Kingdom, MD PhD [facility] Endocrinology

## 2016-04-22 NOTE — Patient Instructions (Addendum)
Please stop at the lab.  Please start Metformin 500 mg with dinner x 4 days. If you tolerate this well, add another Metformin tablet (500 mg) with breakfast x 4 days. If you tolerate this well, add another metformin tablet with dinner (total 1000 mg) x 4 days. If you tolerate this well, add another metformin tablet with breakfast (total 1000 mg). Continue with 1000 mg of metformin 2x a day with breakfast and dinner.  Please continue Levothyroxine 224 mcg daily.  Take the thyroid hormone every day, with water, at least 30 minutes before breakfast, separated by at least 4 hours from: - acid reflux medications - calcium - iron - multivitamins  Please come back for a follow-up appointment in 2 months.

## 2016-04-29 ENCOUNTER — Other Ambulatory Visit: Payer: Self-pay | Admitting: Internal Medicine

## 2016-06-03 ENCOUNTER — Other Ambulatory Visit: Payer: Self-pay | Admitting: Neurology

## 2016-06-03 ENCOUNTER — Other Ambulatory Visit: Payer: Self-pay | Admitting: Internal Medicine

## 2016-06-12 ENCOUNTER — Other Ambulatory Visit: Payer: Self-pay | Admitting: Internal Medicine

## 2016-06-17 ENCOUNTER — Ambulatory Visit: Payer: Self-pay | Admitting: Internal Medicine

## 2016-06-17 ENCOUNTER — Ambulatory Visit: Payer: Self-pay | Admitting: Endocrinology

## 2016-07-21 ENCOUNTER — Telehealth: Payer: Self-pay

## 2016-07-21 ENCOUNTER — Telehealth: Payer: Self-pay | Admitting: Internal Medicine

## 2016-07-21 ENCOUNTER — Ambulatory Visit: Payer: Self-pay | Admitting: Internal Medicine

## 2016-07-21 NOTE — Telephone Encounter (Signed)
Patient no longer has insurance and had to cancel her appt, but still want to come and get he A1C checked. Please advise on what to do.

## 2016-07-21 NOTE — Telephone Encounter (Signed)
Called and LVM for patient regarding the A1C check, I advised her we could see her as a lab visit, and do it as a POC A1C check. Gave office number to call back to get appointment.

## 2016-07-21 NOTE — Telephone Encounter (Signed)
Called and LVM, patient needs lab appointment with a POC A1C test. Gave call back number to office.

## 2016-07-21 NOTE — Telephone Encounter (Signed)
OK for 1x.

## 2016-07-22 ENCOUNTER — Other Ambulatory Visit: Payer: Self-pay | Admitting: Internal Medicine

## 2016-07-24 ENCOUNTER — Other Ambulatory Visit: Payer: Self-pay | Admitting: Internal Medicine

## 2016-09-16 ENCOUNTER — Encounter: Payer: Self-pay | Admitting: Internal Medicine

## 2016-09-16 ENCOUNTER — Ambulatory Visit (INDEPENDENT_AMBULATORY_CARE_PROVIDER_SITE_OTHER): Payer: Self-pay | Admitting: Internal Medicine

## 2016-09-16 VITALS — BP 122/82 | HR 91 | Ht 62.0 in | Wt 342.0 lb

## 2016-09-16 DIAGNOSIS — B372 Candidiasis of skin and nail: Secondary | ICD-10-CM

## 2016-09-16 DIAGNOSIS — E282 Polycystic ovarian syndrome: Secondary | ICD-10-CM

## 2016-09-16 DIAGNOSIS — E1165 Type 2 diabetes mellitus with hyperglycemia: Secondary | ICD-10-CM

## 2016-09-16 DIAGNOSIS — E039 Hypothyroidism, unspecified: Secondary | ICD-10-CM

## 2016-09-16 LAB — POCT GLYCOSYLATED HEMOGLOBIN (HGB A1C): Hemoglobin A1C: 8.6

## 2016-09-16 MED ORDER — SITAGLIPTIN PHOSPHATE 100 MG PO TABS: 100.0000 mg | ORAL_TABLET | Freq: Every day | ORAL | 5 refills | Status: DC

## 2016-09-16 MED ORDER — LEVOTHYROXINE SODIUM 112 MCG PO TABS: ORAL_TABLET | ORAL | 1 refills | Status: DC

## 2016-09-16 MED ORDER — SPIRONOLACTONE 25 MG PO TABS: ORAL_TABLET | ORAL | 2 refills | Status: DC

## 2016-09-16 MED ORDER — METFORMIN HCL ER 500 MG PO TB24: 500.0000 mg | ORAL_TABLET | Freq: Every day | ORAL | 5 refills | Status: DC

## 2016-09-16 NOTE — Progress Notes (Signed)
Patient ID: Autumn Massey, female   DOB: [birth year], 51 y.o.   MRN: [id number]   HPI  Autumn Massey is a 51 y.o.-year-old female, returning for f/u for DM2, hypothyroidism, and PCOS. Last visit 5 mo ago. On disability now.  PCOS: Reviewed hx:  - weight gain: Maximal weight 364  - was on Phentermine, which worked initially, then stopped working  - Would consider gastric bypass but only as a last resort - + hirsutism >> started to see facial hair in her 30s - + hair loss >> female pattern, and bald spot in vertex - no acne - + fatigue - had a Mirena IUD placed 04/2013 for heavy menstrual cycles  - had Prednisone tapers   We started Spironolactone >> 50 mg bid, but potassium increased >> we had to decrease to 25 mg bid. She is really happy with the results of the spironolactone.    Last potassium: Lab Results  Component Value Date   K 3.9 12/28/2015   Hypothyroidism: Pt. has been dx with hypothyroidism in 2005; is on Levothyroxine 224 mcg a day, taken: - fasting - in am - with water or tea - separated by >60 min from b'fast  - no calcium, iron, PPIs   - no multivitamins   I reviewed pt's thyroid tests: Lab Results  Component Value Date   TSH 9.96 (H) 04/22/2016   TSH 2.25 07/23/2015   TSH 1.56 11/22/2014   TSH 1.692 05/24/2014   TSH 0.33 (L) 03/08/2014   TSH 0.33 (L) 01/27/2014   TSH 1.91 01/10/2014   TSH 4.88 (H) 11/29/2013   TSH 10.04 (H) 10/18/2013   TSH 9.58 (H) 08/11/2013   FREET4 0.77 04/22/2016   FREET4 1.08 07/23/2015   FREET4 0.91 11/22/2014   FREET4 1.21 05/24/2014   FREET4 1.05 03/08/2014   FREET4 1.38 01/27/2014   FREET4 1.16 01/10/2014   FREET4 0.86 11/29/2013   FREET4 0.75 10/18/2013    Pt denies feeling nodules in neck, hoarseness, dysphagia/odynophagia, SOB with lying down.  She is on neurontin 3x a day (for tremors) but had to decrease to 2x a day today b/c somnolence.  She is on Tegretol to help with the tremors. She also tried Primidone.    She has been tested for Cushing's sd. In 02/2012 by a Dexamethasone suppression test >> cortisol returned at 1.4 (normal). We also r/o Cushing's sd. 09/2013 by a normal 24h UFC: Component     Latest Ref Rng 10/25/2013  Cortisol (Ur), Free     4.0 - 50.0 mcg/24 h 26.4  Results received     0.63 - 2.50 g/24 h 1.83  Creatinine, Urine      59.9  Creatinine, 24H Ur     700 - 1800 mg/day 1798   She also had normal urine CA and MN (testing for 2nd HTN) >> no pheochromocytoma.  DM2: - HbA1c from 03/2006 higher after her hospitalization >> steroids (sugars 400-500). - No prednisone since last visit  Reviewed HbA1c: Lab Results  Component Value Date   HGBA1C 9.0 04/22/2016   HGBA1C 8.5 11/19/2015   HGBA1C 7.7 (H) 07/23/2015   At last visit, we started: - Metformin 500 mg 1-2x a day >> could not tolerate higher doses than 500 mg bid b/c of diarrhea.  Sugar checks: - am: 90-110 >> 97-121 >> 120-140s - after lunch: 143-171 >> 147-187 (Prednisone) >> n/c - after dinner: 140s-180 >> 160-170  She stopped sweet tea and decreased diet sodas only once a day.  She quit smoking in 05/2015.  ROS: Constitutional: see HPI Eyes: no blurry vision, no xerophthalmia ENT: no sore throat, no nodules palpated in throat, no dysphagia/odynophagia, no hoarseness Cardiovascular: no CP/palpitations/leg swelling Respiratory: + cough/+ SOB/+ wheezing Gastrointestinal: no N/V/D/C Musculoskeletal: + muscle/+ joint aches Skin: + rashes (legs) - stasis dermatitis; but also rash (+ bleeding) in navel Neurological: + tremors/no numbness/tingling/dizziness  PE: BP 122/82 (BP Location: Left Arm, Patient Position: Sitting)   Pulse 91   Ht 5\' 2"  (1.575 m)   Wt (!) 342 lb (155.1 kg)   SpO2 91%   BMI 62.55 kg/m   Body mass index is 62.55 kg/m. Wt Readings from Last 3 Encounters:  09/16/16 (!) 342 lb (155.1 kg)  04/22/16 (!) 334 lb (151.5 kg)  01/08/16 (!) 341 lb (154.7 kg)   Constitutional: obesity  class 3 (see below), in NAD, + full subclavic. fat pads Eyes: PERRLA, EOMI, no exophthalmos ENT: moist mucous membranes, no thyromegaly, no cervical lymphadenopathy Cardiovascular: RRR, No MRG Respiratory: CTA B Gastrointestinal: abdomen soft, NT, ND, BS+ Musculoskeletal: no deformities, strength intact in all 4 Skin: moist, warm, + rash: stasis dermatitis on legs, + Hirsutism (shaves) on chin; + skin tags, + candidiasis in navel Neurological: + B tremor with outstretched hands, DTR normal in all 4  BMI Classification:  < 18.5 underweight   18.5-24.9 normal weight   25.0-29.9 overweight   30.0-34.9 class I obesity   35.0-39.9 class II obesity   ? 40.0 class III obesity   ASSESSMENT: 1. Hypothyroidism  2. PCOS  Testosterone was high >> (2015) indicating PCOS  Component     Latest Ref Rng 10/18/2013  Testosterone     10 - 70 ng/dL 78 (H)  Sex Hormone Binding     18 - 114 nmol/L 28  Testosterone Free     0.6 - 6.8 pg/mL 15.6 (H)  Testosterone-% Free     0.4 - 2.4 % 2.0  Androstenedione      93  DHEA-SO4     35 - 430 ug/dL 35   3. DM2   4. Navel candidiasis  PLAN:  1. Patient with long-standing hypothyroidism, on levothyroxine therapy. She has been on a stable dose of levothyroxine, however, with elevated TSH at last visit. Due to previous hospitalization with change in medicines and dosing of the levothyroxine, we did not change the regimen at last visit. - She does not appear to have a goiter, thyroid nodules, or neck compression symptoms - We again  discussed about correct intake of levothyroxine, fasting, with water, separated by at least 30 minutes from breakfast, and separated by more than 4 hours from calcium, iron, multivitamins, acid reflux medications (PPIs). She is now taking it correctly. However, she skipped the last 3 days because she ran out of the medication. I strongly advised her not to skip it anymore. Unfortunately, we cannot check her tests today  but will check them at next visit. - Refilled her levothyroxine dose, 224 g daily - Return in about 6 weeks (around 10/28/2016).  2. PCOS - on hormonal IUD - She is doing well on Spironolactone >> her body hair has decreased, however, but still some terminal hair on her chin. She is happy with the results of spironolactone. - We could not increase the dose higher than 25 mg twice a day due to increased potassium (5.4) >>  continue 25 mg 2x a day. I reviewed her latest potassium level, and this was normal. - No need to repeat  the testosterone level since on spironolactone which blocks the testosterone receptors  3. DM2 - We checked a hemoglobin A1c today >> 8.6%, slightly better than before - At last visit, since HbA1c was higher, we added Metformin ER >> however, she could not tolerate but the minimum dose. Therefore, we decided to switch to extended-release metformin today. However, I feel that she needs a little be more help and we decided to also add Januvia.  - We also discussed at length about diet, including specific examples for meal consistency, foods to avoid, foods to add, timing of the meals, how to estimate the amount of carbs in the meals, what exactly sugar-free means  4. Navel candidiasis - Recommended clotrimazole twice a day for 2 weeks.  - time spent with the patient: 40 minutes, of which >50% was spent in obtaining information about her symptoms, reviewing her previous labs, evaluations, and treatments, counseling her about her conditions and also discussing at length about diet (please see the discussed topics above); she had a number of questions which I addressed.   Philemon Kingdom, MD PhD [facility] Endocrinology

## 2016-09-16 NOTE — Patient Instructions (Addendum)
Please switch to Metformin ER 500 mg 2x a day, and increase to to 1000 mg 2x a day.  Please add Januvia 100 mg daily in am.  Please continue Levothyroxine 224 mcg daily.  Take the thyroid hormone every day, with water, at least 30 minutes before breakfast, separated by at least 4 hours from: - acid reflux medications - calcium - iron - multivitamins  Please use Clotrimazole cream 2x a day for 14 days.  Please return in 1.5 months with your sugar log.

## 2016-09-26 ENCOUNTER — Telehealth: Payer: Self-pay | Admitting: Internal Medicine

## 2016-09-26 NOTE — Telephone Encounter (Signed)
PLEASE NOTE: All timestamps contained within this report are represented as [place] Standard Time. CONFIDENTIALTY NOTICE: This fax transmission is intended only for the addressee. It contains information that is legally privileged, confidential or otherwise protected from use or disclosure. If you are not the intended recipient, you are strictly prohibited from reviewing, disclosing, copying using or disseminating any of this information or taking any action in reliance on or regarding this information. If you have received this fax in error, please notify us immediately by telephone so that we can arrange for its return to [name]. Phone: [phone], Toll-Free: [phone], Fax: [phone] Page: 1 of 2 Call Id: [id number] [facility] Patient Name: Autumn Massey DOB: [birth year] Initial Comment Caller states she has severe asthma and bronchial issues. Wheezing with wet lung, MD usually calls in [place], she is wondering about getting this called in. Taking Mucinex for mucus, cough medicine, no fever, and really bad cough. Nurse Assessment Nurse: Dimas Chyle, RN, Dellis Filbert Date/Time Eilene Ghazi Time): 09/26/2016 2:17:24 PM Confirm and document reason for call. If symptomatic, describe symptoms. ---Caller states she has severe asthma and bronchial issues. Wheezing with wet lung, MD usually calls in Prednisone, she is wondering about getting this called in. Taking Mucinex for mucus, cough medicine, no fever, and really bad cough. No fever. Symptoms started on Tuesday. Does the patient have any new or worsening symptoms? ---Yes Will a triage be completed? ---Yes Related visit to physician within the last 2 weeks? ---No Does the PT have any chronic conditions? (i.e. diabetes, asthma, etc.) ---Yes List chronic conditions. ---Asthma, HTN Is the patient pregnant or possibly pregnant? (Ask all females between the ages of  36-55) ---No Is this a behavioral health or substance abuse call? ---No Guidelines Guideline Title Affirmed Question Affirmed Notes Asthma Attack [1] Wheezing or coughing AND [2] hasn't used neb or inhaler twice AND [3] it's available Asthma Attack [1] Coughing continuously (nonstop) AND [2] keeps from working or sleeping AND [3] not improved after 2 nebulizer or inhaler treatments given 20 minutes apart Final Disposition User See Physician within 4 Hours (or PCP triage) Dimas Chyle, RN, Dellis Filbert Comments Caller reports symptoms had only improved temporarily with back to back inhaler treatments. Referrals [facility] Disagree/Comply: Comply Disagree/Comply: Comply

## 2016-09-27 ENCOUNTER — Inpatient Hospital Stay (HOSPITAL_COMMUNITY)
Admission: EM | Admit: 2016-09-27 | Discharge: 2016-10-01 | DRG: 202 | Disposition: A | Discharge status: Home / Self Care | Payer: Medicare Other | Attending: Family Medicine | Admitting: Family Medicine

## 2016-09-27 ENCOUNTER — Emergency Department (HOSPITAL_COMMUNITY): Payer: Medicare Other

## 2016-09-27 ENCOUNTER — Encounter (HOSPITAL_COMMUNITY): Payer: Self-pay | Admitting: Emergency Medicine

## 2016-09-27 DIAGNOSIS — Z9889 Other specified postprocedural states: Secondary | ICD-10-CM

## 2016-09-27 DIAGNOSIS — Z811 Family history of alcohol abuse and dependence: Secondary | ICD-10-CM

## 2016-09-27 DIAGNOSIS — J441 Chronic obstructive pulmonary disease with (acute) exacerbation: Secondary | ICD-10-CM | POA: Diagnosis not present

## 2016-09-27 DIAGNOSIS — I5033 Acute on chronic diastolic (congestive) heart failure: Secondary | ICD-10-CM | POA: Diagnosis present

## 2016-09-27 DIAGNOSIS — Z7982 Long term (current) use of aspirin: Secondary | ICD-10-CM

## 2016-09-27 DIAGNOSIS — G4733 Obstructive sleep apnea (adult) (pediatric): Secondary | ICD-10-CM | POA: Diagnosis present

## 2016-09-27 DIAGNOSIS — Z9981 Dependence on supplemental oxygen: Secondary | ICD-10-CM | POA: Diagnosis not present

## 2016-09-27 DIAGNOSIS — J45901 Unspecified asthma with (acute) exacerbation: Secondary | ICD-10-CM | POA: Diagnosis present

## 2016-09-27 DIAGNOSIS — I10 Essential (primary) hypertension: Secondary | ICD-10-CM | POA: Diagnosis present

## 2016-09-27 DIAGNOSIS — Z87891 Personal history of nicotine dependence: Secondary | ICD-10-CM

## 2016-09-27 DIAGNOSIS — R0602 Shortness of breath: Secondary | ICD-10-CM | POA: Diagnosis not present

## 2016-09-27 DIAGNOSIS — Z8261 Family history of arthritis: Secondary | ICD-10-CM

## 2016-09-27 DIAGNOSIS — E1165 Type 2 diabetes mellitus with hyperglycemia: Secondary | ICD-10-CM | POA: Diagnosis present

## 2016-09-27 DIAGNOSIS — I110 Hypertensive heart disease with heart failure: Secondary | ICD-10-CM | POA: Diagnosis not present

## 2016-09-27 DIAGNOSIS — I5032 Chronic diastolic (congestive) heart failure: Secondary | ICD-10-CM

## 2016-09-27 DIAGNOSIS — T380X5A Adverse effect of glucocorticoids and synthetic analogues, initial encounter: Secondary | ICD-10-CM | POA: Diagnosis present

## 2016-09-27 DIAGNOSIS — Z808 Family history of malignant neoplasm of other organs or systems: Secondary | ICD-10-CM

## 2016-09-27 DIAGNOSIS — Z79899 Other long term (current) drug therapy: Secondary | ICD-10-CM

## 2016-09-27 DIAGNOSIS — R0902 Hypoxemia: Secondary | ICD-10-CM | POA: Diagnosis present

## 2016-09-27 DIAGNOSIS — R062 Wheezing: Secondary | ICD-10-CM

## 2016-09-27 DIAGNOSIS — Z8249 Family history of ischemic heart disease and other diseases of the circulatory system: Secondary | ICD-10-CM

## 2016-09-27 DIAGNOSIS — E039 Hypothyroidism, unspecified: Secondary | ICD-10-CM | POA: Diagnosis present

## 2016-09-27 DIAGNOSIS — Z7984 Long term (current) use of oral hypoglycemic drugs: Secondary | ICD-10-CM

## 2016-09-27 DIAGNOSIS — J189 Pneumonia, unspecified organism: Secondary | ICD-10-CM | POA: Diagnosis not present

## 2016-09-27 DIAGNOSIS — J181 Lobar pneumonia, unspecified organism: Secondary | ICD-10-CM

## 2016-09-27 MED ORDER — METHYLPREDNISOLONE SODIUM SUCC 125 MG IJ SOLR
80.0000 mg | Freq: Once | INTRAMUSCULAR | Status: AC
  Administered 2016-09-28: 80 mg via INTRAVENOUS
  Filled 2016-09-27: qty 2

## 2016-09-27 MED ORDER — IPRATROPIUM-ALBUTEROL 0.5-2.5 (3) MG/3ML IN SOLN
3.0000 mL | Freq: Once | RESPIRATORY_TRACT | Status: AC
  Administered 2016-09-28: 3 mL via RESPIRATORY_TRACT
  Filled 2016-09-27: qty 3

## 2016-09-27 MED ORDER — AZITHROMYCIN 250 MG PO TABS
500.0000 mg | ORAL_TABLET | Freq: Once | ORAL | Status: AC
  Administered 2016-09-28: 500 mg via ORAL
  Filled 2016-09-27: qty 2

## 2016-09-27 MED ORDER — DEXTROSE 5 % IV SOLN
1.0000 g | Freq: Once | INTRAVENOUS | Status: AC
  Administered 2016-09-28: 1 g via INTRAVENOUS
  Filled 2016-09-27: qty 10

## 2016-09-27 NOTE — ED Notes (Signed)
Bed: PT00 Expected date:  Expected time:  Means of arrival:  Comments: EMS 51 yo female asthma/COPD-wheezing/albuterol neb

## 2016-09-27 NOTE — ED Triage Notes (Signed)
Patient complaining of shortness of breath. Patient takes spironolactone for blocking Testerone. Patient is on 2L [place] at home. Patient is not complaining of pain.

## 2016-09-27 NOTE — ED Provider Notes (Signed)
[facility] DEPT Provider Note   CSN: [id number] Arrival date & time: 09/27/16  2206  By signing my name below, I, Reola Mosher, attest that this documentation has been prepared under the direction and in the presence of Virgel Manifold, MD. Electronically Signed: Reola Mosher, ED Scribe. 09/27/16. 11:30 PM.  History   Chief Complaint Chief Complaint  Patient presents with  . Shortness of Breath   The history is provided by the patient. No language interpreter was used.    HPI Comments: Autumn Massey is a 51 y.o. female with a h/o DM, COPD, obesity, PCOS, hypothyroidism, HTN/HLD, and asthma, who presents to the Emergency Department complaining of persistent shortness of breath beginning four days ago. Per pt, she woke up with a sore throat four days ago and since then her symptoms have progrezssed into shortness of breath, wheezing, dysphonia, and productive cough w/ mucosal sputum. She has been taking Mucinex, anti-tussives, Tylenol without relief of her symptoms. She also has been using her Albutrol inhaler at home with mild, temporary relief. Pt notes that her symptoms today are some-what consistent with asthma flare-ups. She is currently prescribed 2L/min oxygen at night; however, today she notes that her oxygen saturation was running at 82% and she has been wearing her oxygen all day today with improvement of this. Pt is currently prescribed Metformin 1000mg  BID; HbA1c has been decreasing per chart review. She denies fever, leg swelling, or any other associated symptoms.  Endocrinologist: Philemon Kingdom, MD PCP: [name], MD  Past Medical History:  Diagnosis Date  . ANXIETY DEPRESSION 01/29/2009  . DEPRESSION 11/19/2007  . FREQUENCY, URINARY 10/17/2009  . HYPERLIPIDEMIA 11/19/2007  . HYPERTENSION 11/19/2007  . HYPOTHYROIDISM 11/19/2007  . Morbid obesity ([place])   . PNEUMONIA 08/06/2010  . POLYCYTHEMIA 01/29/2009  . Preventative health care 04/06/2011  . Sleep apnea    mild sleep apnea, on o2 at 2l at nighttime  . SLEEP RELATED HYPOVENTILATION/HYPOXEMIA CCE 02/08/2009  . Venous insufficiency 07/19/2012   Patient Active Problem List   Diagnosis Date Noted  . Asthma with acute exacerbation 12/20/2015  . Type 2 diabetes mellitus with hyperglycemia, without long-term current use of insulin ([place]) 11/19/2015  . Dyspnea 10/09/2015  . Chronic respiratory failure with hypoxia and hypercapnia ([place]) 07/19/2015  . COPD GOLD 0  07/18/2015  . Vaginitis 05/31/2015  . Thoracic outlet syndrome 01/29/2015  . Ulnar neuropathy at elbow of right upper extremity 12/19/2014  . Benign essential tremor 10/13/2014  . Movement disorder 08/30/2014  . Cough 08/30/2014  . Rash and nonspecific skin eruption 03/07/2014  . Skin lesion of back 03/07/2014  . PCOS (polycystic ovarian syndrome) 10/31/2013  . History of CVA (cerebrovascular accident) 08/11/2013  . Asthmatic bronchitis 06/10/2013  . Acute meniscal tear of right knee 06/06/2013  . Sprain of MCL (medial collateral ligament) of knee 05/03/2013  . Left arm pain 02/09/2013  . Hypoxemia 07/19/2012  . Venous insufficiency 07/19/2012  . Severe obesity (BMI >= 40) ([place]) 07/19/2012  . Former smoker 04/11/2011  . Encounter for preventative adult health care exam with abnormal findings 04/06/2011  . SLEEP RELATED HYPOVENTILATION/HYPOXEMIA CCE 02/08/2009  . POLYCYTHEMIA 01/29/2009  . ANXIETY DEPRESSION 01/29/2009  . Hypothyroidism 11/19/2007  . HYPERLIPIDEMIA 11/19/2007  . DEPRESSION 11/19/2007  . Essential hypertension 11/19/2007   Past Surgical History:  Procedure Laterality Date  . TONSILLECTOMY    . ULNAR TUNNEL RELEASE Left 02/22/2013   Procedure: LEFT ULNAR NERVE DECOMPRESSION ;  Surgeon: Tennis Must, MD;  Location: [facility];  Service: Orthopedics;  Laterality: Left;   OB History    No data available     Home Medications    Prior to Admission medications   Medication Sig Start Date End Date  Taking? Authorizing Provider  albuterol (PROVENTIL HFA;VENTOLIN HFA) 108 (90 BASE) MCG/ACT inhaler Inhale 2 puffs into the lungs every 6 (six) hours as needed for wheezing or shortness of breath.    Historical Provider, MD  aspirin EC 81 MG tablet Take 81 mg by mouth daily.    Historical Provider, MD  bisoprolol (ZEBETA) 5 MG tablet Take 1 tablet (5 mg total) by mouth daily. 10/05/15   Tanda Rockers, MD  clonazePAM (KLONOPIN) 0.5 MG tablet Take 1 tablet (0.5 mg total) by mouth 2 (two) times daily as needed for anxiety. 12/27/14   Biagio Borg, MD  famotidine (PEPCID) 20 MG tablet Take 1 tablet (20 mg total) by mouth at bedtime. 02/22/16   Tanda Rockers, MD  FLUoxetine (PROZAC) 20 MG capsule TAKE ONE CAPSULE BY MOUTH DAILY. [facility] appointment. FOR FUTURE REFILLS. 04/03/16   Biagio Borg, MD  fluticasone furoate-vilanterol (BREO ELLIPTA) 100-25 MCG/INH AEPB Inhale 1 puff into the lungs daily. 12/24/15   Brand Males, MD  furosemide (LASIX) 40 MG tablet TAKE ONE TABLET BY MOUTH EVERY DAY. Must keep appointment FOR future refills 06/03/16   Biagio Borg, MD  GARLIC PO Take 1 tablet by mouth daily.    Historical Provider, MD  glucose blood test strip Use to test blood sugar 1 time daily as instructed. 05/29/15   Philemon Kingdom, MD  hydrochlorothiazide (HYDRODIURIL) 25 MG tablet TAKE ONE TABLET BY MOUTH EVERY DAY 06/03/16   Biagio Borg, MD  irbesartan (AVAPRO) 300 MG tablet TAKE ONE TABLET BY MOUTH at bedtime 12/28/15   Biagio Borg, MD  levothyroxine (SYNTHROID, LEVOTHROID) 112 MCG tablet TAKE TWO TABLETS BY MOUTH IN THE MORNING BEFORE BREAKFAST 09/16/16   Philemon Kingdom, MD  metFORMIN (GLUCOPHAGE-XR) 500 MG 24 hr tablet Take 1 tablet (500 mg total) by mouth daily with breakfast. 09/16/16   Philemon Kingdom, MD  NON FORMULARY Oxygen 2L use as bedtime    Historical Provider, MD  pantoprazole (PROTONIX) 40 MG tablet 1 tablet 30-60 min before first meal of the day 02/20/16   Tanda Rockers, MD  primidone  (MYSOLINE) 50 MG tablet TAKE 2 AND 1/2 TABLETS BY MOUTH AT BEDTIME. Please schedule follow up visit 06/03/16   Pieter Partridge, DO  simvastatin (ZOCOR) 40 MG tablet Take 1 tablet (40 mg total) by mouth at bedtime. Keep July appt for future refills 12/07/15   Biagio Borg, MD  sitaGLIPtin (JANUVIA) 100 MG tablet Take 1 tablet (100 mg total) by mouth daily. 09/16/16   Philemon Kingdom, MD  spironolactone (ALDACTONE) 25 MG tablet TAKE ONE TABLET BY MOUTH TWICE DAILY. 09/16/16   Philemon Kingdom, MD   Family History Family History  Problem Relation Age of Onset  . Cancer Mother     melanoma   . Cancer Maternal Grandfather     prostate cancer  . Alcohol abuse Other   . Arthritis Other   . Hypertension Other    Social History Social History  Substance Use Topics  . Smoking status: Former Smoker    Packs/day: 0.50    Years: 22.00    Types: Cigarettes    Quit date: 05/22/2015  . Smokeless tobacco: Never Used  . Alcohol use 0.0 oz/week  Comment: rare   Allergies   Patient has no known allergies.  Review of Systems Review of Systems  Constitutional: Negative for fever.  HENT: Positive for sore throat and voice change.   Respiratory: Positive for cough, shortness of breath and wheezing.   Cardiovascular: Negative for leg swelling.  All other systems reviewed and are negative.  Physical Exam Updated Vital Signs BP (!) 141/73 (BP Location: Left Arm)   Pulse 80   Resp 19   Ht 5\' 2"  (1.575 m)   Wt (!) 342 lb (155.1 kg)   SpO2 93%   BMI 62.55 kg/m   Physical Exam  Constitutional: She appears well-developed and well-nourished.  HENT:  Head: Normocephalic.  Right Ear: External ear normal.  Left Ear: External ear normal.  Nose: Nose normal.  Mouth/Throat: Oropharynx is clear and moist.  Oropharynx is clear.   Eyes: Conjunctivae are normal. Right eye exhibits no discharge. Left eye exhibits no discharge.  Neck: Normal range of motion.  Cardiovascular: Normal rate, regular rhythm  and normal heart sounds.   No murmur heard. Pulmonary/Chest: Effort normal. No respiratory distress. She has wheezes. She has no rales.  Bilateral wheezing is noted.   Abdominal: Soft. She exhibits no distension. There is no tenderness. There is no rebound and no guarding.  Musculoskeletal: Normal range of motion. She exhibits no edema or tenderness.  Neurological: She is alert. No cranial nerve deficit. Coordination normal.  Skin: Skin is warm and dry. No rash noted. No erythema. No pallor.  Psychiatric: She has a normal mood and affect. Her behavior is normal.  Nursing note and vitals reviewed.  ED Treatments / Results  DIAGNOSTIC STUDIES: Oxygen Saturation is 93% on 2L via [place], normal by my interpretation.   COORDINATION OF CARE: 11:27 PM-Discussed next steps with pt. Pt verbalized understanding and is agreeable with the plan.   Labs (all labs ordered are listed, but only abnormal results are displayed) Labs Reviewed  COMPREHENSIVE METABOLIC PANEL - Abnormal; Notable for the following:       Result Value   Chloride 95 (*)    Glucose, Bld 301 (*)    All other components within normal limits  HEMOGLOBIN A1C - Abnormal; Notable for the following:    Hgb A1c MFr Bld 9.3 (*)    All other components within normal limits  GLUCOSE, CAPILLARY - Abnormal; Notable for the following:    Glucose-Capillary 319 (*)    All other components within normal limits  GLUCOSE, CAPILLARY - Abnormal; Notable for the following:    Glucose-Capillary 394 (*)    All other components within normal limits  GLUCOSE, CAPILLARY - Abnormal; Notable for the following:    Glucose-Capillary 403 (*)    All other components within normal limits  HEMOGLOBIN A1C - Abnormal; Notable for the following:    Hgb A1c MFr Bld 9.5 (*)    All other components within normal limits  GLUCOSE, CAPILLARY - Abnormal; Notable for the following:    Glucose-Capillary 442 (*)    All other components within normal limits  CBC  WITH DIFFERENTIAL/PLATELET - Abnormal; Notable for the following:    WBC 15.3 (*)    Neutro Abs 13.5 (*)    All other components within normal limits  COMPREHENSIVE METABOLIC PANEL - Abnormal; Notable for the following:    Sodium 134 (*)    Chloride 93 (*)    Glucose, Bld 384 (*)    BUN 29 (*)    All other components within normal limits  GLUCOSE, CAPILLARY - Abnormal; Notable for the following:    Glucose-Capillary 331 (*)    All other components within normal limits  GLUCOSE, CAPILLARY - Abnormal; Notable for the following:    Glucose-Capillary 399 (*)    All other components within normal limits  GLUCOSE, CAPILLARY - Abnormal; Notable for the following:    Glucose-Capillary 457 (*)    All other components within normal limits  GLUCOSE, CAPILLARY - Abnormal; Notable for the following:    Glucose-Capillary 433 (*)    All other components within normal limits  CBC WITH DIFFERENTIAL/PLATELET - Abnormal; Notable for the following:    WBC 19.1 (*)    Neutro Abs 16.9 (*)    All other components within normal limits  COMPREHENSIVE METABOLIC PANEL - Abnormal; Notable for the following:    Sodium 133 (*)    Chloride 91 (*)    Glucose, Bld 357 (*)    BUN 35 (*)    All other components within normal limits  GLUCOSE, CAPILLARY - Abnormal; Notable for the following:    Glucose-Capillary 305 (*)    All other components within normal limits  GLUCOSE, CAPILLARY - Abnormal; Notable for the following:    Glucose-Capillary 394 (*)    All other components within normal limits  CBC - Abnormal; Notable for the following:    WBC 17.1 (*)    All other components within normal limits  BASIC METABOLIC PANEL - Abnormal; Notable for the following:    Sodium 134 (*)    Potassium 3.2 (*)    Chloride 88 (*)    CO2 35 (*)    Glucose, Bld 196 (*)    BUN 37 (*)    All other components within normal limits  GLUCOSE, CAPILLARY - Abnormal; Notable for the following:    Glucose-Capillary 332 (*)     All other components within normal limits  GLUCOSE, CAPILLARY - Abnormal; Notable for the following:    Glucose-Capillary 363 (*)    All other components within normal limits  GLUCOSE, CAPILLARY - Abnormal; Notable for the following:    Glucose-Capillary 192 (*)    All other components within normal limits  GLUCOSE, CAPILLARY - Abnormal; Notable for the following:    Glucose-Capillary 340 (*)    All other components within normal limits  CBC  HIV ANTIBODY (ROUTINE TESTING)  INFLUENZA PANEL BY PCR (TYPE A & B)  BRAIN NATRIURETIC PEPTIDE  MAGNESIUM    EKG  EKG Interpretation None      Radiology No results found.   Dg Chest 2 View  Result Date: 09/27/2016 CLINICAL DATA:  Acute onset of shortness of breath and wheezing. Initial encounter. EXAM: CHEST  2 VIEW COMPARISON:  Chest radiograph and CT of the chest performed 12/28/2015 FINDINGS: The lungs are well-aerated. Vascular congestion is noted. Increased interstitial markings raise concern for pulmonary edema. Underlying right basilar pneumonia cannot be excluded. No pleural effusion or pneumothorax is seen. The heart is normal in size; the mediastinal contour is within normal limits. No acute osseous abnormalities are seen. IMPRESSION: Vascular congestion. Increased interstitial markings raise concern for pulmonary edema. Underlying right basilar pneumonia cannot be excluded. Electronically Signed   By: Garald Balding M.D.   On: 09/27/2016 23:23   Procedures Procedures   Medications Ordered in ED Medications - No data to display  Initial Impression / Assessment and Plan / ED Course  I have reviewed the triage vital signs and the nursing notes.  Pertinent labs & imaging results that  were available during my care of the patient were reviewed by me and considered in my medical decision making (see chart for details).     50yF with copd exacerbation and possible CAP. Requiring o2 beyond baseline. Admit.   Final Clinical  Impressions(s) / ED Diagnoses   Final diagnoses:  COPD exacerbation ([place])  Community acquired pneumonia of right lower lobe of lung ([place])   New Prescriptions New Prescriptions   No medications on file   I personally preformed the services scribed in my presence. The recorded information has been reviewed is accurate. Virgel Manifold, MD.     Virgel Manifold, MD 10/05/16 [phone]

## 2016-09-27 NOTE — ED Notes (Signed)
Patient is on 10 mg of albuterol as EMS rolled in room. Patient has had 125 mg of solumedrol and 1.0 of atrovent.

## 2016-09-28 ENCOUNTER — Encounter (HOSPITAL_COMMUNITY): Payer: Self-pay | Admitting: Internal Medicine

## 2016-09-28 DIAGNOSIS — Z87891 Personal history of nicotine dependence: Secondary | ICD-10-CM | POA: Diagnosis not present

## 2016-09-28 DIAGNOSIS — E039 Hypothyroidism, unspecified: Secondary | ICD-10-CM

## 2016-09-28 DIAGNOSIS — J441 Chronic obstructive pulmonary disease with (acute) exacerbation: Secondary | ICD-10-CM | POA: Diagnosis not present

## 2016-09-28 DIAGNOSIS — T380X5A Adverse effect of glucocorticoids and synthetic analogues, initial encounter: Secondary | ICD-10-CM | POA: Diagnosis present

## 2016-09-28 DIAGNOSIS — Z9889 Other specified postprocedural states: Secondary | ICD-10-CM | POA: Diagnosis not present

## 2016-09-28 DIAGNOSIS — Z7984 Long term (current) use of oral hypoglycemic drugs: Secondary | ICD-10-CM | POA: Diagnosis not present

## 2016-09-28 DIAGNOSIS — I5033 Acute on chronic diastolic (congestive) heart failure: Secondary | ICD-10-CM | POA: Diagnosis present

## 2016-09-28 DIAGNOSIS — I10 Essential (primary) hypertension: Secondary | ICD-10-CM

## 2016-09-28 DIAGNOSIS — Z811 Family history of alcohol abuse and dependence: Secondary | ICD-10-CM | POA: Diagnosis not present

## 2016-09-28 DIAGNOSIS — Z8261 Family history of arthritis: Secondary | ICD-10-CM | POA: Diagnosis not present

## 2016-09-28 DIAGNOSIS — J45901 Unspecified asthma with (acute) exacerbation: Secondary | ICD-10-CM | POA: Diagnosis present

## 2016-09-28 DIAGNOSIS — R0902 Hypoxemia: Secondary | ICD-10-CM | POA: Diagnosis present

## 2016-09-28 DIAGNOSIS — E1165 Type 2 diabetes mellitus with hyperglycemia: Secondary | ICD-10-CM | POA: Diagnosis present

## 2016-09-28 DIAGNOSIS — Z808 Family history of malignant neoplasm of other organs or systems: Secondary | ICD-10-CM | POA: Diagnosis not present

## 2016-09-28 DIAGNOSIS — J189 Pneumonia, unspecified organism: Secondary | ICD-10-CM | POA: Insufficient documentation

## 2016-09-28 DIAGNOSIS — G4733 Obstructive sleep apnea (adult) (pediatric): Secondary | ICD-10-CM | POA: Diagnosis present

## 2016-09-28 DIAGNOSIS — I110 Hypertensive heart disease with heart failure: Secondary | ICD-10-CM | POA: Diagnosis present

## 2016-09-28 DIAGNOSIS — Z9981 Dependence on supplemental oxygen: Secondary | ICD-10-CM | POA: Diagnosis not present

## 2016-09-28 DIAGNOSIS — Z7982 Long term (current) use of aspirin: Secondary | ICD-10-CM | POA: Diagnosis not present

## 2016-09-28 DIAGNOSIS — Z79899 Other long term (current) drug therapy: Secondary | ICD-10-CM | POA: Diagnosis not present

## 2016-09-28 DIAGNOSIS — Z8249 Family history of ischemic heart disease and other diseases of the circulatory system: Secondary | ICD-10-CM | POA: Diagnosis not present

## 2016-09-28 DIAGNOSIS — I5032 Chronic diastolic (congestive) heart failure: Secondary | ICD-10-CM

## 2016-09-28 LAB — INFLUENZA PANEL BY PCR (TYPE A & B)
INFLAPCR: NEGATIVE
Influenza B By PCR: NEGATIVE

## 2016-09-28 LAB — CBC
HEMATOCRIT: 42.6 % (ref 36.0–46.0)
Hemoglobin: 13.8 g/dL (ref 12.0–15.0)
MCH: 31.3 pg (ref 26.0–34.0)
MCHC: 32.4 g/dL (ref 30.0–36.0)
MCV: 96.6 fL (ref 78.0–100.0)
Platelets: 193 10*3/uL (ref 150–400)
RBC: 4.41 MIL/uL (ref 3.87–5.11)
RDW: 12.6 % (ref 11.5–15.5)
WBC: 7.7 10*3/uL (ref 4.0–10.5)

## 2016-09-28 LAB — COMPREHENSIVE METABOLIC PANEL
ALT: 23 U/L (ref 14–54)
AST: 29 U/L (ref 15–41)
Albumin: 3.5 g/dL (ref 3.5–5.0)
Alkaline Phosphatase: 52 U/L (ref 38–126)
Anion gap: 12 (ref 5–15)
BILIRUBIN TOTAL: 0.6 mg/dL (ref 0.3–1.2)
BUN: 13 mg/dL (ref 6–20)
CO2: 31 mmol/L (ref 22–32)
Calcium: 8.9 mg/dL (ref 8.9–10.3)
Chloride: 95 mmol/L — ABNORMAL LOW (ref 101–111)
Creatinine, Ser: 0.72 mg/dL (ref 0.44–1.00)
GLUCOSE: 301 mg/dL — AB (ref 65–99)
Potassium: 3.9 mmol/L (ref 3.5–5.1)
Sodium: 138 mmol/L (ref 135–145)
Total Protein: 7.4 g/dL (ref 6.5–8.1)

## 2016-09-28 LAB — GLUCOSE, CAPILLARY
GLUCOSE-CAPILLARY: 403 mg/dL — AB (ref 65–99)
GLUCOSE-CAPILLARY: 442 mg/dL — AB (ref 65–99)
Glucose-Capillary: 319 mg/dL — ABNORMAL HIGH (ref 65–99)
Glucose-Capillary: 394 mg/dL — ABNORMAL HIGH (ref 65–99)

## 2016-09-28 LAB — HIV ANTIBODY (ROUTINE TESTING W REFLEX): HIV Screen 4th Generation wRfx: NONREACTIVE

## 2016-09-28 MED ORDER — SODIUM CHLORIDE 0.9% FLUSH
3.0000 mL | Freq: Two times a day (BID) | INTRAVENOUS | Status: DC
  Administered 2016-09-28 (×2): 3 mL via INTRAVENOUS

## 2016-09-28 MED ORDER — SODIUM CHLORIDE 0.9% FLUSH: 3.0000 mL | INTRAVENOUS | Status: DC | PRN

## 2016-09-28 MED ORDER — FLUTICASONE FUROATE-VILANTEROL 200-25 MCG/INH IN AEPB
1.0000 | INHALATION_SPRAY | Freq: Every day | RESPIRATORY_TRACT | Status: DC
  Administered 2016-09-28 – 2016-10-01 (×4): 1 via RESPIRATORY_TRACT
  Filled 2016-09-28: qty 28

## 2016-09-28 MED ORDER — TIOTROPIUM BROMIDE MONOHYDRATE 18 MCG IN CAPS
18.0000 ug | ORAL_CAPSULE | Freq: Every day | RESPIRATORY_TRACT | Status: DC
  Administered 2016-09-28 – 2016-10-01 (×4): 18 ug via RESPIRATORY_TRACT
  Filled 2016-09-28: qty 5

## 2016-09-28 MED ORDER — SIMVASTATIN 40 MG PO TABS
40.0000 mg | ORAL_TABLET | Freq: Every day | ORAL | Status: DC
  Administered 2016-09-28 – 2016-09-30 (×3): 40 mg via ORAL
  Filled 2016-09-28 (×3): qty 1

## 2016-09-28 MED ORDER — DM-GUAIFENESIN ER 30-600 MG PO TB12: 1.0000 | ORAL_TABLET | Freq: Two times a day (BID) | ORAL | Status: DC | PRN

## 2016-09-28 MED ORDER — SODIUM CHLORIDE 0.9 % IV SOLN: 250.0000 mL | INTRAVENOUS | Status: DC | PRN

## 2016-09-28 MED ORDER — GUAIFENESIN ER 600 MG PO TB12
600.0000 mg | ORAL_TABLET | Freq: Two times a day (BID) | ORAL | Status: DC
  Administered 2016-09-28 – 2016-09-30 (×5): 600 mg via ORAL
  Filled 2016-09-28 (×5): qty 1

## 2016-09-28 MED ORDER — IRBESARTAN 150 MG PO TABS
300.0000 mg | ORAL_TABLET | Freq: Every day | ORAL | Status: DC
  Administered 2016-09-28 – 2016-09-30 (×3): 300 mg via ORAL
  Filled 2016-09-28 (×3): qty 2

## 2016-09-28 MED ORDER — INSULIN ASPART 100 UNIT/ML ~~LOC~~ SOLN: 0.0000 [IU] | Freq: Every day | SUBCUTANEOUS | Status: DC

## 2016-09-28 MED ORDER — ENOXAPARIN SODIUM 60 MG/0.6ML ~~LOC~~ SOLN
60.0000 mg | SUBCUTANEOUS | Status: DC
  Administered 2016-09-28 – 2016-09-30 (×3): 60 mg via SUBCUTANEOUS
  Filled 2016-09-28 (×4): qty 0.6

## 2016-09-28 MED ORDER — PREDNISONE 20 MG PO TABS: 40.0000 mg | ORAL_TABLET | Freq: Every day | ORAL | 0 refills | Status: DC

## 2016-09-28 MED ORDER — FUROSEMIDE 40 MG PO TABS
40.0000 mg | ORAL_TABLET | Freq: Every day | ORAL | Status: DC
  Administered 2016-09-28 – 2016-10-01 (×4): 40 mg via ORAL
  Filled 2016-09-28 (×4): qty 1

## 2016-09-28 MED ORDER — LEVOFLOXACIN IN D5W 500 MG/100ML IV SOLN
500.0000 mg | INTRAVENOUS | Status: DC
  Administered 2016-09-28 – 2016-09-30 (×3): 500 mg via INTRAVENOUS
  Filled 2016-09-28 (×3): qty 100

## 2016-09-28 MED ORDER — SODIUM CHLORIDE 0.9% FLUSH
3.0000 mL | Freq: Two times a day (BID) | INTRAVENOUS | Status: DC
  Administered 2016-09-28 – 2016-09-30 (×5): 3 mL via INTRAVENOUS

## 2016-09-28 MED ORDER — ACETAMINOPHEN 325 MG PO TABS
650.0000 mg | ORAL_TABLET | Freq: Four times a day (QID) | ORAL | Status: DC | PRN
  Administered 2016-09-28 – 2016-10-01 (×3): 650 mg via ORAL
  Filled 2016-09-28 (×3): qty 2

## 2016-09-28 MED ORDER — BISOPROLOL FUMARATE 5 MG PO TABS
5.0000 mg | ORAL_TABLET | Freq: Every day | ORAL | Status: DC
  Administered 2016-09-28 – 2016-10-01 (×4): 5 mg via ORAL
  Filled 2016-09-28 (×4): qty 1

## 2016-09-28 MED ORDER — INSULIN ASPART 100 UNIT/ML ~~LOC~~ SOLN
0.0000 [IU] | Freq: Three times a day (TID) | SUBCUTANEOUS | Status: DC
  Administered 2016-09-28: 9 [IU] via SUBCUTANEOUS
  Administered 2016-09-28: 7 [IU] via SUBCUTANEOUS

## 2016-09-28 MED ORDER — METFORMIN HCL ER 500 MG PO TB24
500.0000 mg | ORAL_TABLET | Freq: Every day | ORAL | Status: DC
  Administered 2016-09-28: 500 mg via ORAL
  Filled 2016-09-28: qty 1

## 2016-09-28 MED ORDER — INSULIN ASPART 100 UNIT/ML ~~LOC~~ SOLN
0.0000 [IU] | Freq: Three times a day (TID) | SUBCUTANEOUS | Status: DC
  Administered 2016-09-28 – 2016-09-29 (×2): 20 [IU] via SUBCUTANEOUS
  Administered 2016-09-29: 15 [IU] via SUBCUTANEOUS
  Administered 2016-09-29: 20 [IU] via SUBCUTANEOUS
  Administered 2016-09-30: 15 [IU] via SUBCUTANEOUS
  Administered 2016-09-30: 20 [IU] via SUBCUTANEOUS
  Administered 2016-09-30 – 2016-10-01 (×2): 15 [IU] via SUBCUTANEOUS
  Administered 2016-10-01: 4 [IU] via SUBCUTANEOUS

## 2016-09-28 MED ORDER — AZITHROMYCIN 250 MG PO TABS: 250.0000 mg | ORAL_TABLET | Freq: Every day | ORAL | 0 refills | Status: DC

## 2016-09-28 MED ORDER — FLUOXETINE HCL 20 MG PO CAPS
20.0000 mg | ORAL_CAPSULE | Freq: Every day | ORAL | Status: DC
  Administered 2016-09-28 – 2016-10-01 (×4): 20 mg via ORAL
  Filled 2016-09-28 (×4): qty 1

## 2016-09-28 MED ORDER — ASPIRIN EC 81 MG PO TBEC
81.0000 mg | DELAYED_RELEASE_TABLET | Freq: Every day | ORAL | Status: DC
  Administered 2016-09-28 – 2016-10-01 (×4): 81 mg via ORAL
  Filled 2016-09-28 (×4): qty 1

## 2016-09-28 MED ORDER — PRIMIDONE 250 MG PO TABS
125.0000 mg | ORAL_TABLET | Freq: Every day | ORAL | Status: DC
  Administered 2016-09-28 – 2016-09-30 (×3): 125 mg via ORAL
  Filled 2016-09-28 (×4): qty 1

## 2016-09-28 MED ORDER — ACETAMINOPHEN 650 MG RE SUPP: 650.0000 mg | Freq: Four times a day (QID) | RECTAL | Status: DC | PRN

## 2016-09-28 MED ORDER — INSULIN ASPART 100 UNIT/ML ~~LOC~~ SOLN
5.0000 [IU] | Freq: Once | SUBCUTANEOUS | Status: AC
Start: 2016-09-28 — End: 2016-09-28
  Administered 2016-09-28: 5 [IU] via SUBCUTANEOUS

## 2016-09-28 MED ORDER — ALBUTEROL SULFATE (2.5 MG/3ML) 0.083% IN NEBU
2.5000 mg | INHALATION_SOLUTION | RESPIRATORY_TRACT | Status: DC
  Administered 2016-09-28 – 2016-09-29 (×8): 2.5 mg via RESPIRATORY_TRACT
  Filled 2016-09-28 (×9): qty 3

## 2016-09-28 MED ORDER — LEVOTHYROXINE SODIUM 112 MCG PO TABS
224.0000 ug | ORAL_TABLET | Freq: Every day | ORAL | Status: DC
  Administered 2016-09-28 – 2016-10-01 (×4): 224 ug via ORAL
  Filled 2016-09-28 (×5): qty 2

## 2016-09-28 MED ORDER — METHYLPREDNISOLONE SODIUM SUCC 125 MG IJ SOLR
60.0000 mg | Freq: Two times a day (BID) | INTRAMUSCULAR | Status: DC
  Administered 2016-09-28 – 2016-09-29 (×2): 60 mg via INTRAVENOUS
  Filled 2016-09-28 (×2): qty 2

## 2016-09-28 MED ORDER — METHYLPREDNISOLONE SODIUM SUCC 125 MG IJ SOLR
80.0000 mg | Freq: Three times a day (TID) | INTRAMUSCULAR | Status: DC
  Administered 2016-09-28: 80 mg via INTRAVENOUS
  Filled 2016-09-28: qty 2

## 2016-09-28 MED ORDER — ALBUTEROL SULFATE (2.5 MG/3ML) 0.083% IN NEBU
2.5000 mg | INHALATION_SOLUTION | Freq: Four times a day (QID) | RESPIRATORY_TRACT | Status: DC | PRN
  Administered 2016-09-30: 2.5 mg via RESPIRATORY_TRACT

## 2016-09-28 MED ORDER — SPIRONOLACTONE 25 MG PO TABS
25.0000 mg | ORAL_TABLET | Freq: Two times a day (BID) | ORAL | Status: DC
  Administered 2016-09-28 – 2016-10-01 (×7): 25 mg via ORAL
  Filled 2016-09-28 (×7): qty 1

## 2016-09-28 MED ORDER — INSULIN ASPART 100 UNIT/ML ~~LOC~~ SOLN
0.0000 [IU] | Freq: Every day | SUBCUTANEOUS | Status: DC
  Administered 2016-09-28 – 2016-09-30 (×2): 5 [IU] via SUBCUTANEOUS

## 2016-09-28 MED ORDER — GUAIFENESIN 100 MG/5ML PO SOLN: 200.0000 mg | Freq: Three times a day (TID) | ORAL | Status: DC | PRN

## 2016-09-28 NOTE — H&P (Signed)
TRH H&P   Patient Demographics:    Autumn Massey, is a 51 y.o. female  MRN: [id number]   DOB - [birth year]  Admit Date - 09/27/2016  Outpatient Primary MD for the patient is [name], MD  Referring MD/NP/PA:  Dr. Wilson Singer  Outpatient Specialists: Christinia Gully  Patient coming from:  home  Chief Complaint  Patient presents with  . Shortness of Breath      HPI:    Autumn Massey  is a 51 y.o. female, w morbid obeisity, OSA on home o2 2L  at nite, Asthma,  hypertension, dm2,  apparently c/o due to hypoxemia dropped to 82%,  Pt put on her o2, and then later called EMS cause of lack of improvement.  + slight tan sputum. Denies fever, chills, cp, palp, n/v, diarrhea.   In ED, CXR showed can not exclude right basilar pneumonia.  Pt will be admitted for asthma exacerbation.     Review of systems:    In addition to the HPI above,  No Fever-chills, No Headache, No changes with Vision or hearing, No problems swallowing food or Liquids, No Chest pain,  No Abdominal pain, No Nausea or Vommitting, Bowel movements are regular, No Blood in stool or Urine, No dysuria, No new skin rashes or bruises, No new joints pains-aches,  No new weakness, tingling, numbness in any extremity, No recent weight gain or loss, No polyuria, polydypsia or polyphagia, No significant Mental Stressors.  A full 10 point Review of Systems was done, except as stated above, all other Review of Systems were negative.   With Past History of the following :    Past Medical History:  Diagnosis Date  . ANXIETY DEPRESSION 01/29/2009  . DEPRESSION 11/19/2007  . FREQUENCY, URINARY 10/17/2009  . HYPERLIPIDEMIA 11/19/2007  . HYPERTENSION 11/19/2007  . HYPOTHYROIDISM 11/19/2007  . Morbid obesity ([place])   . PNEUMONIA 08/06/2010  . POLYCYTHEMIA 01/29/2009  . Preventative health care 04/06/2011  . Sleep apnea    mild  sleep apnea, on o2 at 2l at nighttime  . SLEEP RELATED HYPOVENTILATION/HYPOXEMIA CCE 02/08/2009  . Venous insufficiency 07/19/2012      Past Surgical History:  Procedure Laterality Date  . TONSILLECTOMY    . ULNAR TUNNEL RELEASE Left 02/22/2013   Procedure: LEFT ULNAR NERVE DECOMPRESSION ;  Surgeon: Tennis Must, MD;  Location: [facility];  Service: Orthopedics;  Laterality: Left;      Social History:     Social History  Substance Use Topics  . Smoking status: Former Smoker    Packs/day: 0.50    Years: 22.00    Types: Cigarettes    Quit date: 05/22/2015  . Smokeless tobacco: Never Used  . Alcohol use 0.0 oz/week     Comment: rare     Lives - at home  Mobility - able to walk by self    Family History :  Family History  Problem Relation Age of Onset  . Cancer Mother     melanoma   . Cancer Maternal Grandfather     prostate cancer  . Alcohol abuse Other   . Arthritis Other   . Hypertension Other       Home Medications:   Prior to Admission medications   Medication Sig Start Date End Date Taking? Authorizing Provider  acetaminophen (TYLENOL) 500 MG tablet Take 500 mg by mouth every 6 (six) hours as needed for mild pain or headache.   Yes Historical Provider, MD  albuterol (PROVENTIL HFA;VENTOLIN HFA) 108 (90 BASE) MCG/ACT inhaler Inhale 2 puffs into the lungs every 6 (six) hours as needed for wheezing or shortness of breath.   Yes Historical Provider, MD  aspirin EC 81 MG tablet Take 81 mg by mouth daily.   Yes Historical Provider, MD  bisoprolol (ZEBETA) 5 MG tablet Take 1 tablet (5 mg total) by mouth daily. 10/05/15  Yes Tanda Rockers, MD  dextromethorphan-guaiFENesin [facility] DM) 30-600 MG 12hr tablet Take 1 tablet by mouth 2 (two) times daily as needed for cough.   Yes Historical Provider, MD  FLUoxetine (PROZAC) 20 MG capsule TAKE ONE CAPSULE BY MOUTH DAILY. [place] appointment. FOR FUTURE REFILLS. 04/03/16  Yes Biagio Borg, MD  furosemide  (LASIX) 40 MG tablet TAKE ONE TABLET BY MOUTH EVERY DAY. Must keep appointment FOR future refills 06/03/16  Yes Biagio Borg, MD  GARLIC PO Take 1 tablet by mouth daily.   Yes Historical Provider, MD  guaifenesin (ROBITUSSIN) 100 MG/5ML syrup Take 200 mg by mouth 3 (three) times daily as needed for cough.   Yes Historical Provider, MD  hydrochlorothiazide (HYDRODIURIL) 25 MG tablet TAKE ONE TABLET BY MOUTH EVERY DAY 06/03/16  Yes Biagio Borg, MD  irbesartan (AVAPRO) 300 MG tablet TAKE ONE TABLET BY MOUTH at bedtime 12/28/15  Yes Biagio Borg, MD  levothyroxine (SYNTHROID, LEVOTHROID) 112 MCG tablet TAKE TWO TABLETS BY MOUTH IN THE MORNING BEFORE BREAKFAST 09/16/16  Yes Philemon Kingdom, MD  metFORMIN (GLUCOPHAGE-XR) 500 MG 24 hr tablet Take 1 tablet (500 mg total) by mouth daily with breakfast. 09/16/16  Yes Philemon Kingdom, MD  primidone (MYSOLINE) 50 MG tablet TAKE 2 AND 1/2 TABLETS BY MOUTH AT BEDTIME. Please schedule follow up visit 06/03/16  Yes Adam Telford Nab, DO  simvastatin (ZOCOR) 40 MG tablet Take 1 tablet (40 mg total) by mouth at bedtime. Keep July appt for future refills 12/07/15  Yes Biagio Borg, MD  spironolactone (ALDACTONE) 25 MG tablet TAKE ONE TABLET BY MOUTH TWICE DAILY. 09/16/16  Yes Philemon Kingdom, MD  azithromycin (ZITHROMAX Z-PAK) 250 MG tablet Take 1 tablet (250 mg total) by mouth daily. 2 pills (500mg ) day 1 then 1 pill (250mg ) days 2-5. 09/28/16   Virgel Manifold, MD  fluticasone furoate-vilanterol (BREO ELLIPTA) 100-25 MCG/INH AEPB Inhale 1 puff into the lungs daily. Patient not taking: Reported on 09/27/2016 12/24/15   Brand Males, MD  glucose blood test strip Use to test blood sugar 1 time daily as instructed. 05/29/15   Philemon Kingdom, MD  NON FORMULARY Oxygen 2L use as bedtime    Historical Provider, MD  pantoprazole (PROTONIX) 40 MG tablet 1 tablet 30-60 min before first meal of the day Patient not taking: Reported on 09/27/2016 02/20/16   Tanda Rockers, MD  predniSONE  (DELTASONE) 20 MG tablet Take 2 tablets (40 mg total) by mouth daily. 09/28/16   Virgel Manifold, MD  sitaGLIPtin (JANUVIA) 100 MG tablet Take 1 tablet (100 mg total) by mouth daily. Patient not taking: Reported on 09/27/2016 09/16/16   Philemon Kingdom, MD     Allergies:    No Known Allergies   Physical Exam:   Vitals  Blood pressure (!) 150/83, pulse 86, temperature 98.5 F (36.9 C), temperature source Oral, resp. rate 18, height 5\' 2"  (1.575 m), weight (!) 155.1 kg (342 lb), SpO2 90 %.   1. General lying in bed in NAD,    2. Normal affect and insight, Not Suicidal or Homicidal, Awake Alert, Oriented X 3.  3. No F.N deficits, ALL C.Nerves Intact, Strength 5/5 all 4 extremities, Sensation intact all 4 extremities, Plantars down going.  4. Ears and Eyes appear Normal, Conjunctivae clear, PERRLA. Moist Oral Mucosa.  5. Supple Neck, No JVD, No cervical lymphadenopathy appriciated, No Carotid Bruits.  6. Symmetrical Chest wall movement, Good air movement bilaterally, no crackles, + bilateral exp wheezing  7. RRR, No Gallops, Rubs or Murmurs, No Parasternal Heave.  8. Positive Bowel Sounds, Abdomen Soft, No tenderness, No organomegaly appriciated,No rebound -guarding or rigidity.  9.  No Cyanosis, Normal Skin Turgor,  10. Good muscle tone,  joints appear normal , no effusions, Normal ROM.  11. No Palpable Lymph Nodes in Neck or Axillae  + venous stasis changes in bilateral legs   Data Review:    CBC No results for input(s): WBC, HGB, HCT, PLT, MCV, MCH, MCHC, RDW, LYMPHSABS, MONOABS, EOSABS, BASOSABS, BANDABS in the last 168 hours.  Invalid input(s): NEUTRABS, BANDSABD ------------------------------------------------------------------------------------------------------------------  Chemistries  No results for input(s): NA, K, CL, CO2, GLUCOSE, BUN, CREATININE, CALCIUM, MG, AST, ALT, ALKPHOS, BILITOT in the last 168 hours.  Invalid input(s):  GFRCGP ------------------------------------------------------------------------------------------------------------------ CrCl cannot be calculated (Patient's most recent lab result is older than the maximum 21 days allowed.). ------------------------------------------------------------------------------------------------------------------ No results for input(s): TSH, T4TOTAL, T3FREE, THYROIDAB in the last 72 hours.  Invalid input(s): FREET3  Coagulation profile No results for input(s): INR, PROTIME in the last 168 hours. ------------------------------------------------------------------------------------------------------------------- No results for input(s): DDIMER in the last 72 hours. -------------------------------------------------------------------------------------------------------------------  Cardiac Enzymes No results for input(s): CKMB, TROPONINI, MYOGLOBIN in the last 168 hours.  Invalid input(s): CK ------------------------------------------------------------------------------------------------------------------    Component Value Date/Time   BNP 25.6 12/28/2015 1426     ---------------------------------------------------------------------------------------------------------------  Urinalysis    Component Value Date/Time   COLORURINE YELLOW 11/22/2014 [place] 11/22/2014 1403   LABSPEC 1.025 11/22/2014 1403   PHURINE 6.0 11/22/2014 1403   GLUCOSEU NEGATIVE 11/22/2014 1403   HGBUR TRACE-INTACT (A) 11/22/2014 1403   BILIRUBINUR NEGATIVE 11/22/2014 1403   [place] 11/22/2014 1403   UROBILINOGEN 0.2 11/22/2014 1403   NITRITE NEGATIVE 11/22/2014 1403   LEUKOCYTESUR SMALL (A) 11/22/2014 1403    ----------------------------------------------------------------------------------------------------------------   Imaging Results:    Dg Chest 2 View  Result Date: 09/27/2016 CLINICAL DATA:  Acute onset of shortness of breath and wheezing.  Initial encounter. EXAM: CHEST  2 VIEW COMPARISON:  Chest radiograph and CT of the chest performed 12/28/2015 FINDINGS: The lungs are well-aerated. Vascular congestion is noted. Increased interstitial markings raise concern for pulmonary edema. Underlying right basilar pneumonia cannot be excluded. No pleural effusion or pneumothorax is seen. The heart is normal in size; the mediastinal contour is within normal limits. No acute osseous abnormalities are seen. IMPRESSION: Vascular congestion. Increased interstitial markings raise concern for pulmonary edema. Underlying right basilar pneumonia cannot be excluded. Electronically Signed   By: Francoise Schaumann.D.  On: 09/27/2016 23:23     Assessment & Plan:    Principal Problem:   Asthma exacerbation Active Problems:   Hypothyroidism   Essential hypertension    1. Asthma exacerbation Solumedrol 80mg  iv q8h levaquin 500mg  iv qday symbicort 2puff bid spiriva 1puff qday singulair 10mg  poq day  2. Hypertension Cont current medication  3. Hypothryoidism Cont levothyroxine   DVT Prophylaxis Lovenox - SCDs   AM Labs Ordered, also please review Full Orders  Family Communication: Admission, patients condition and plan of care including tests being ordered have been discussed with the patient  who indicate understanding and agree with the plan and Code Status.  Code Status FULL CODE  Likely DC to   home  Condition GUARDED    Consults called:   Admission status: inpatient  Time spent in minutes : 58minutes   Jani Gravel M.D on 09/28/2016 at 3:13 AM  Between 7am to 7pm - Pager - [phone]. After 7pm go to [email or web address] - password [facility]  [facility] - Office  [phone]

## 2016-09-28 NOTE — Progress Notes (Signed)
Lovenox per Pharmacy for DVT Prophylaxis    Pharmacy has been consulted from dosing enoxaparin (lovenox) in this patient for DVT prophylaxis.  The pharmacist has reviewed pertinent labs (Hgb _13.8__; PLT_193__), patient weight (_153.4__kg) and renal function (CrCl___mL/min) and decided that enoxaparin _60_mg SQ Q24Hrs is appropriate for this patient.  The pharmacy department will sign off at this time.  Please reconsult pharmacy if status changes or for further issues.  Thank you  Cyndia Diver PharmD, BCPS  09/28/2016, 5:05 AM

## 2016-09-28 NOTE — Progress Notes (Signed)
[facility] PLAN OF CARE NOTE Patient: Autumn Massey MVH:[id number]   PCP: [name], MD DOB: 1966-03-03   DOA: 09/27/2016   DOS: 09/28/2016    Patient was admitted by my colleague Dr. Maudie Mercury earlier on 09/28/2016. I have reviewed the H&P as well as assessment and plan and agree with the same. Important changes in the plan are listed below.  Plan of care: Principal Problem:   Asthma exacerbation Continue current regimen, will likely discontinue antibiotics in next 24 hours as asthma exacerbations are less likely bacterial in nature. Influenza PCR negative. Change Solu-Medrol to 60 mg every 12 hours.  Active Problems:   Essential hypertension   Chronic CHF ([place]) Will currently continue Lasix 40 mg daily, Avapro, holding hydrochlorothiazide. I will check echocardiogram as chest x-ray shows concern for pulmonary edema .  Type 2 diabetes mellitus, uncontrolled. Check hemoglobin A1c. Changing sliding scale insulin resistant.  Author: Berle Mull, MD [facility] Pager: [phone] 09/28/2016 3:32 PM   If 7PM-7AM, please contact night-coverage at [email or web address], password [facility]

## 2016-09-28 NOTE — Discharge Instructions (Signed)
I think you will benefit from steroids with how symptomatic/wheezy you are. This will make our sugar go up. Increase your metformin as discussed for one week.

## 2016-09-28 NOTE — ED Notes (Signed)
Patient states she has not taken blood pressure medication today but usually takes them on time and doesn't skip a dose. Patient has been taking mucinex dm every 12 hours and Robitussin DM every 4 to 6 hours. Patient states she started the congestion Thursday and Friday.

## 2016-09-29 ENCOUNTER — Inpatient Hospital Stay (HOSPITAL_COMMUNITY): Payer: Medicare Other

## 2016-09-29 DIAGNOSIS — I509 Heart failure, unspecified: Secondary | ICD-10-CM

## 2016-09-29 LAB — CBC WITH DIFFERENTIAL/PLATELET
BASOS PCT: 0 %
Basophils Absolute: 0 10*3/uL (ref 0.0–0.1)
Eosinophils Absolute: 0 10*3/uL (ref 0.0–0.7)
Eosinophils Relative: 0 %
HEMATOCRIT: 41.5 % (ref 36.0–46.0)
HEMOGLOBIN: 13.4 g/dL (ref 12.0–15.0)
LYMPHS ABS: 1 10*3/uL (ref 0.7–4.0)
Lymphocytes Relative: 7 %
MCH: 31.1 pg (ref 26.0–34.0)
MCHC: 32.3 g/dL (ref 30.0–36.0)
MCV: 96.3 fL (ref 78.0–100.0)
MONO ABS: 0.8 10*3/uL (ref 0.1–1.0)
MONOS PCT: 5 %
NEUTROS ABS: 13.5 10*3/uL — AB (ref 1.7–7.7)
Neutrophils Relative %: 88 %
Platelets: 225 10*3/uL (ref 150–400)
RBC: 4.31 MIL/uL (ref 3.87–5.11)
RDW: 12.8 % (ref 11.5–15.5)
WBC: 15.3 10*3/uL — ABNORMAL HIGH (ref 4.0–10.5)

## 2016-09-29 LAB — COMPREHENSIVE METABOLIC PANEL
ALT: 19 U/L (ref 14–54)
ANION GAP: 9 (ref 5–15)
AST: 23 U/L (ref 15–41)
Albumin: 3.5 g/dL (ref 3.5–5.0)
Alkaline Phosphatase: 52 U/L (ref 38–126)
BILIRUBIN TOTAL: 0.7 mg/dL (ref 0.3–1.2)
BUN: 29 mg/dL — ABNORMAL HIGH (ref 6–20)
CO2: 32 mmol/L (ref 22–32)
Calcium: 9.2 mg/dL (ref 8.9–10.3)
Chloride: 93 mmol/L — ABNORMAL LOW (ref 101–111)
Creatinine, Ser: 0.89 mg/dL (ref 0.44–1.00)
Glucose, Bld: 384 mg/dL — ABNORMAL HIGH (ref 65–99)
POTASSIUM: 4.2 mmol/L (ref 3.5–5.1)
Sodium: 134 mmol/L — ABNORMAL LOW (ref 135–145)
TOTAL PROTEIN: 7.1 g/dL (ref 6.5–8.1)

## 2016-09-29 LAB — GLUCOSE, CAPILLARY
GLUCOSE-CAPILLARY: 399 mg/dL — AB (ref 65–99)
GLUCOSE-CAPILLARY: 457 mg/dL — AB (ref 65–99)
Glucose-Capillary: 331 mg/dL — ABNORMAL HIGH (ref 65–99)
Glucose-Capillary: 433 mg/dL — ABNORMAL HIGH (ref 65–99)

## 2016-09-29 LAB — ECHOCARDIOGRAM COMPLETE
Height: 62 in
Weight: 5410.97 oz

## 2016-09-29 LAB — HEMOGLOBIN A1C
Hgb A1c MFr Bld: 9.3 % — ABNORMAL HIGH (ref 4.8–5.6)
Mean Plasma Glucose: 220 mg/dL

## 2016-09-29 LAB — BRAIN NATRIURETIC PEPTIDE: B Natriuretic Peptide: 61.1 pg/mL (ref 0.0–100.0)

## 2016-09-29 MED ORDER — INSULIN ASPART 100 UNIT/ML ~~LOC~~ SOLN
10.0000 [IU] | Freq: Once | SUBCUTANEOUS | Status: AC
  Administered 2016-09-29: 10 [IU] via SUBCUTANEOUS

## 2016-09-29 MED ORDER — PERFLUTREN LIPID MICROSPHERE
INTRAVENOUS | Status: AC
  Filled 2016-09-29: qty 10

## 2016-09-29 MED ORDER — PERFLUTREN LIPID MICROSPHERE
1.0000 mL | INTRAVENOUS | Status: AC | PRN
  Administered 2016-09-29: 6 mL via INTRAVENOUS
  Filled 2016-09-29: qty 10

## 2016-09-29 MED ORDER — INSULIN ASPART 100 UNIT/ML ~~LOC~~ SOLN
6.0000 [IU] | Freq: Three times a day (TID) | SUBCUTANEOUS | Status: DC
  Administered 2016-09-29 – 2016-10-01 (×6): 6 [IU] via SUBCUTANEOUS

## 2016-09-29 MED ORDER — INSULIN GLARGINE 100 UNIT/ML ~~LOC~~ SOLN
12.0000 [IU] | Freq: Every day | SUBCUTANEOUS | Status: DC
  Administered 2016-09-29 – 2016-10-01 (×3): 12 [IU] via SUBCUTANEOUS
  Filled 2016-09-29 (×3): qty 0.12

## 2016-09-29 MED ORDER — PREDNISONE 20 MG PO TABS
40.0000 mg | ORAL_TABLET | Freq: Every day | ORAL | Status: DC
  Administered 2016-09-30 – 2016-10-01 (×2): 40 mg via ORAL
  Filled 2016-09-29 (×2): qty 2

## 2016-09-29 MED ORDER — ALBUTEROL SULFATE (2.5 MG/3ML) 0.083% IN NEBU
2.5000 mg | INHALATION_SOLUTION | RESPIRATORY_TRACT | Status: DC
  Administered 2016-09-29 – 2016-10-01 (×9): 2.5 mg via RESPIRATORY_TRACT
  Filled 2016-09-29 (×10): qty 3

## 2016-09-29 MED ORDER — METHYLPREDNISOLONE SODIUM SUCC 125 MG IJ SOLR
60.0000 mg | Freq: Two times a day (BID) | INTRAMUSCULAR | Status: DC
Start: 2016-09-29 — End: 2016-09-29
  Administered 2016-09-29: 60 mg via INTRAVENOUS
  Filled 2016-09-29: qty 2

## 2016-09-29 NOTE — Progress Notes (Signed)
  Echocardiogram 2D Echocardiogram with definity has been performed.  Darlina Sicilian M 09/29/2016, 8:33 AM

## 2016-09-29 NOTE — Progress Notes (Signed)
[facility] Progress Note  Patient: Autumn Massey [id number]   PCP: [name], MD DOB: 12-11-1965   DOA: 09/27/2016   DOS: 09/29/2016   Date of Service: the patient was seen and examined on 09/29/2016  Subjective: Continues to have shortness of breath as well as cough also getting better.  Brief hospital course: Pt. with PMH of hypertension, nocturnal hypoxia, morbid obesity, asthma, chronic diastolic CHF, type II DM; admitted on 09/27/2016, with complaint of shortness of breath and cough, was found to have asthma exacerbation. Currently further plan is continue current management.  Assessment and Plan: 1. Asthma exacerbation Continue IV Solu-Medrol every 12 hours. Continue DuoNeb scheduled. Continue Mucinex. Continue antibiotics. We'll wean oxygen to room air during the day.  2. Chronic diastolic CHF. Patient is taking Lasix, Aldactone, bystolic. Echogram shows diastolic dysfunction, normal EF. Continue home regimen. Chest x-ray shows mild vascular congestion but patient clinically euvolemic and denies having any weight gain.  3. Hypothyroidism. Continue Synthroid.  4. Type 2 diabetes mellitus, uncontrolled with hyperglycemia. Next and blood sugar significantly elevated due to steroids. Hemoglobin A1c 9.6. Patient is on metformin which was recently started as well as Januvia which was also recently started. I would add 12 units of Lantus daily, 6 units of aspart before meals. Continue resistant sliding scale.  Bowel regimen: last BM 09/28/2016 Diet: Cardiac and carb modified diet DVT Prophylaxis: subcutaneous Heparin  Advance goals of care discussion: Full code  Family Communication: no family was present at bedside, at the time of interview.  Disposition:  Discharge to home. Expected discharge date: 04/03-09/2016, depending on improvement in oxygenation  Consultants: none Procedures: none  Antibiotics: Anti-infectives    Start     Dose/Rate Route Frequency  Ordered Stop   09/28/16 2200  levofloxacin (LEVAQUIN) IVPB 500 mg     500 mg 100 mL/hr over 60 Minutes Intravenous Every 24 hours 09/28/16 0446     09/28/16 0000  azithromycin (ZITHROMAX Z-PAK) 250 MG tablet     250 mg Oral Daily 09/28/16 0127     09/27/16 2345  cefTRIAXone (ROCEPHIN) 1 g in dextrose 5 % 50 mL IVPB     1 g 100 mL/hr over 30 Minutes Intravenous  Once 09/27/16 2332 09/28/16 0218   09/27/16 2345  azithromycin (ZITHROMAX) tablet 500 mg     500 mg Oral  Once 09/27/16 2332 09/28/16 0056       Objective: Physical Exam: Vitals:   09/29/16 0823 09/29/16 1107 09/29/16 1500 09/29/16 1551  BP:   (!) 120/56   Pulse: 90 63 69 (!) 56  Resp: 18 18 14 16   Temp:   98.1 F (36.7 C)   TempSrc:   Oral   SpO2: 99% 98% 95% 97%  Weight:      Height:        Intake/Output Summary (Last 24 hours) at 09/29/16 1824 Last data filed at 09/29/16 0601  Gross per 24 hour  Intake              100 ml  Output                0 ml  Net              100 ml   Filed Weights   09/27/16 2216 09/28/16 0437  Weight: (!) 155.1 kg (342 lb) (!) 153.4 kg (338 lb 3 oz)   General: Alert, Awake and Oriented to Time, Place and Person. Appear in mild distress, affect appropriate Eyes: PERRL, Conjunctiva  normal ENT: Oral Mucosa clear moist. Neck: difficult to assess JVD, no Abnormal Mass Or lumps Cardiovascular: S1 and S2 Present, no Murmur, Respiratory: Bilateral Air entry equal and Decreased, no use of accessory muscle, bilateral Crackles, expiratory wheezes Abdomen: Bowel Sound present, Soft and no tenderness Skin: no redness, no Rash, no induration Extremities: bilateral Pedal edema, no calf tenderness Neurologic: Grossly no focal neuro deficit. Bilaterally Equal motor strength  Data Reviewed: CBC:  Recent Labs Lab 09/28/16 0519 09/29/16 0436  WBC 7.7 15.3*  NEUTROABS  --  13.5*  HGB 13.8 13.4  HCT 42.6 41.5  MCV 96.6 96.3  PLT 193 748   Basic Metabolic Panel:  Recent Labs Lab  09/28/16 0519 09/29/16 0436  NA 138 134*  K 3.9 4.2  CL 95* 93*  CO2 31 32  GLUCOSE 301* 384*  BUN 13 29*  CREATININE 0.72 0.89  CALCIUM 8.9 9.2    Liver Function Tests:  Recent Labs Lab 09/28/16 0519 09/29/16 0436  AST 29 23  ALT 23 19  ALKPHOS 52 52  BILITOT 0.6 0.7  PROT 7.4 7.1  ALBUMIN 3.5 3.5   No results for input(s): LIPASE, AMYLASE in the last 168 hours. No results for input(s): AMMONIA in the last 168 hours. Coagulation Profile: No results for input(s): INR, PROTIME in the last 168 hours. Cardiac Enzymes: No results for input(s): CKTOTAL, CKMB, CKMBINDEX, TROPONINI in the last 168 hours. BNP (last 3 results)  Recent Labs  10/09/15 1424  PROBNP 66.0   CBG:  Recent Labs Lab 09/28/16 1655 09/28/16 2221 09/29/16 0821 09/29/16 1249 09/29/16 1714  GLUCAP 403* 442* 331* 399* 457*   Studies: No results found.  Scheduled Meds: . albuterol  2.5 mg Nebulization Q4H WA  . aspirin EC  81 mg Oral Daily  . bisoprolol  5 mg Oral Daily  . enoxaparin (LOVENOX) injection  60 mg Subcutaneous Q24H  . FLUoxetine  20 mg Oral Daily  . fluticasone furoate-vilanterol  1 puff Inhalation Daily  . furosemide  40 mg Oral Daily  . guaiFENesin  600 mg Oral BID  . insulin aspart  0-20 Units Subcutaneous TID WC  . insulin aspart  0-5 Units Subcutaneous QHS  . insulin aspart  6 Units Subcutaneous TID WC  . insulin glargine  12 Units Subcutaneous Daily  . irbesartan  300 mg Oral QHS  . levofloxacin (LEVAQUIN) IV  500 mg Intravenous Q24H  . levothyroxine  224 mcg Oral QAC breakfast  . methylPREDNISolone (SOLU-MEDROL) injection  60 mg Intravenous Q12H  . primidone  125 mg Oral QHS  . simvastatin  40 mg Oral QHS  . sodium chloride flush  3 mL Intravenous Q12H  . spironolactone  25 mg Oral BID  . tiotropium  18 mcg Inhalation Daily   Continuous Infusions: PRN Meds: sodium chloride, acetaminophen **OR** acetaminophen, albuterol, sodium chloride flush  Time spent: 30  minutes  Author: Berle Mull, MD [facility] Pager: [phone] 09/29/2016 6:24 PM  If 7PM-7AM, please contact night-coverage at [email or web address], password [facility]

## 2016-09-30 ENCOUNTER — Inpatient Hospital Stay (HOSPITAL_COMMUNITY): Payer: Medicare Other

## 2016-09-30 LAB — CBC WITH DIFFERENTIAL/PLATELET
BASOS ABS: 0 10*3/uL (ref 0.0–0.1)
BASOS PCT: 0 %
EOS ABS: 0 10*3/uL (ref 0.0–0.7)
EOS PCT: 0 %
HCT: 42.5 % (ref 36.0–46.0)
Hemoglobin: 13.7 g/dL (ref 12.0–15.0)
LYMPHS PCT: 7 %
Lymphs Abs: 1.4 10*3/uL (ref 0.7–4.0)
MCH: 31.3 pg (ref 26.0–34.0)
MCHC: 32.2 g/dL (ref 30.0–36.0)
MCV: 97 fL (ref 78.0–100.0)
Monocytes Absolute: 0.9 10*3/uL (ref 0.1–1.0)
Monocytes Relative: 5 %
Neutro Abs: 16.9 10*3/uL — ABNORMAL HIGH (ref 1.7–7.7)
Neutrophils Relative %: 88 %
PLATELETS: 239 10*3/uL (ref 150–400)
RBC: 4.38 MIL/uL (ref 3.87–5.11)
RDW: 12.7 % (ref 11.5–15.5)
WBC: 19.1 10*3/uL — AB (ref 4.0–10.5)

## 2016-09-30 LAB — COMPREHENSIVE METABOLIC PANEL
ALBUMIN: 3.8 g/dL (ref 3.5–5.0)
ALT: 20 U/L (ref 14–54)
AST: 25 U/L (ref 15–41)
Alkaline Phosphatase: 46 U/L (ref 38–126)
Anion gap: 11 (ref 5–15)
BUN: 35 mg/dL — AB (ref 6–20)
CO2: 31 mmol/L (ref 22–32)
CREATININE: 0.89 mg/dL (ref 0.44–1.00)
Calcium: 9.4 mg/dL (ref 8.9–10.3)
Chloride: 91 mmol/L — ABNORMAL LOW (ref 101–111)
GFR calc Af Amer: >60 mL/min (ref >60)
GFR calc non Af Amer: >60 mL/min (ref >60)
GLUCOSE: 357 mg/dL — AB (ref 65–99)
POTASSIUM: 3.8 mmol/L (ref 3.5–5.1)
SODIUM: 133 mmol/L — AB (ref 135–145)
Total Bilirubin: 0.6 mg/dL (ref 0.3–1.2)
Total Protein: 7.6 g/dL (ref 6.5–8.1)

## 2016-09-30 LAB — HEMOGLOBIN A1C
HEMOGLOBIN A1C: 9.5 % — AB (ref 4.8–5.6)
MEAN PLASMA GLUCOSE: 226 mg/dL

## 2016-09-30 LAB — GLUCOSE, CAPILLARY
GLUCOSE-CAPILLARY: 332 mg/dL — AB (ref 65–99)
GLUCOSE-CAPILLARY: 363 mg/dL — AB (ref 65–99)
Glucose-Capillary: 305 mg/dL — ABNORMAL HIGH (ref 65–99)
Glucose-Capillary: 394 mg/dL — ABNORMAL HIGH (ref 65–99)

## 2016-09-30 LAB — MAGNESIUM: Magnesium: 1.8 mg/dL (ref 1.7–2.4)

## 2016-09-30 MED ORDER — MENTHOL 3 MG MT LOZG
1.0000 | LOZENGE | OROMUCOSAL | Status: DC | PRN
  Filled 2016-09-30: qty 9

## 2016-09-30 MED ORDER — METFORMIN HCL ER 500 MG PO TB24
500.0000 mg | ORAL_TABLET | Freq: Every day | ORAL | Status: DC
  Administered 2016-10-01: 500 mg via ORAL
  Filled 2016-09-30 (×2): qty 1

## 2016-09-30 MED ORDER — GUAIFENESIN ER 600 MG PO TB12
1200.0000 mg | ORAL_TABLET | Freq: Two times a day (BID) | ORAL | Status: DC
  Administered 2016-09-30 – 2016-10-01 (×2): 1200 mg via ORAL
  Filled 2016-09-30 (×2): qty 2

## 2016-09-30 MED ORDER — ENOXAPARIN SODIUM 80 MG/0.8ML ~~LOC~~ SOLN
75.0000 mg | SUBCUTANEOUS | Status: DC
  Administered 2016-10-01: 09:00:00 75 mg via SUBCUTANEOUS
  Filled 2016-09-30 (×2): qty 0.8

## 2016-09-30 MED ORDER — BENZONATATE 100 MG PO CAPS
100.0000 mg | ORAL_CAPSULE | Freq: Three times a day (TID) | ORAL | Status: DC
  Administered 2016-09-30 – 2016-10-01 (×5): 100 mg via ORAL
  Filled 2016-09-30 (×5): qty 1

## 2016-09-30 MED ORDER — FUROSEMIDE 10 MG/ML IJ SOLN
40.0000 mg | Freq: Once | INTRAMUSCULAR | Status: AC
  Administered 2016-09-30: 40 mg via INTRAVENOUS
  Filled 2016-09-30: qty 4

## 2016-09-30 MED ORDER — SALINE SPRAY 0.65 % NA SOLN
1.0000 | NASAL | Status: DC | PRN
  Filled 2016-09-30: qty 44

## 2016-09-30 NOTE — Progress Notes (Signed)
[facility] Progress Note  Patient: Autumn Massey FIE:[id number]   PCP: [name], MD DOB: 10/13/1965   DOA: 09/27/2016   DOS: 09/30/2016   Date of Service: the patient was seen and examined on 09/30/2016  Subjective: Significantly short of breath after showering, denies any chest pain or abdominal pain, increasing cough when lying down.  Brief hospital course: Pt. with PMH of hypertension, nocturnal hypoxia, morbid obesity, asthma, chronic diastolic CHF, type II DM; admitted on 09/27/2016, with complaint of shortness of breath and cough, was found to have asthma exacerbation. Currently further plan is continue current management.  Assessment and Plan: 1. Asthma exacerbation Stop IV Solu-Medrol, changed to oral prednisone. Continue DuoNeb scheduled. Continue Mucinex. Continue antibiotics. We'll wean oxygen to room air during the day.  2. Acute on Chronic diastolic CHF. Patient is taking Lasix, Aldactone, bystolic. Echogram shows diastolic dysfunction, normal EF. Continue home regimen. We'll give one time IV Lasix and monitor. Chest x-ray shows mild vascular congestion, patient actively losing weight in the hospital.  3. Hypothyroidism. Continue Synthroid.  4. Type 2 diabetes mellitus, uncontrolled with hyperglycemia. Next and blood sugar significantly elevated due to steroids. Hemoglobin A1c 9.6. Patient is on metformin which was recently started as well as Januvia which was also recently started , although patient has not taken it. I would add 12 units of Lantus daily, 6 units of aspart before meals. Continue resistant sliding scale. Patient is currently agreeing for insulin therapy. Will educate for insulin injection. Get diabetic educator involved.  Bowel regimen: last BM 09/28/2016 Diet: Cardiac and carb modified diet DVT Prophylaxis: subcutaneous Heparin  Advance goals of care discussion: Full code  Family Communication: no family was present at bedside, at the time of  interview.  Disposition:  Discharge to home. Expected discharge date: 10/01/2016, depending on improvement in oxygenation  Consultants: none Procedures: none  Antibiotics: Anti-infectives    Start     Dose/Rate Route Frequency Ordered Stop   09/28/16 2200  levofloxacin (LEVAQUIN) IVPB 500 mg     500 mg 100 mL/hr over 60 Minutes Intravenous Every 24 hours 09/28/16 0446     09/28/16 0000  azithromycin (ZITHROMAX Z-PAK) 250 MG tablet     250 mg Oral Daily 09/28/16 0127     09/27/16 2345  cefTRIAXone (ROCEPHIN) 1 g in dextrose 5 % 50 mL IVPB     1 g 100 mL/hr over 30 Minutes Intravenous  Once 09/27/16 2332 09/28/16 0218   09/27/16 2345  azithromycin (ZITHROMAX) tablet 500 mg     500 mg Oral  Once 09/27/16 2332 09/28/16 0056       Objective: Physical Exam: Vitals:   09/30/16 0704 09/30/16 0910 09/30/16 1232 09/30/16 1554  BP: (!) 143/68   (!) 153/69  Pulse: (!) 57 63 61 61  Resp: 18 18 18 18   Temp: 97.6 F (36.4 C)   98.2 F (36.8 C)  TempSrc: Oral   Oral  SpO2: 95% 98% 97% 95%  Weight: (!) 152.8 kg (336 lb 12.8 oz)     Height:       No intake or output data in the 24 hours ending 09/30/16 1638 Filed Weights   09/27/16 2216 09/28/16 0437 09/30/16 0704  Weight: (!) 155.1 kg (342 lb) (!) 153.4 kg (338 lb 3 oz) (!) 152.8 kg (336 lb 12.8 oz)   General: Alert, Awake and Oriented to Time, Place and Person. Appear in mild distress, affect appropriate Eyes: PERRL, Conjunctiva normal ENT: Oral Mucosa clear moist. Neck: difficult  to assess JVD, no Abnormal Mass Or lumps Cardiovascular: S1 and S2 Present, no Murmur, Respiratory: Bilateral Air entry equal and Decreased, no use of accessory muscle, bilateral Crackles, expiratory wheezes Abdomen: Bowel Sound present, Soft and no tenderness Skin: no redness, no Rash, no induration Extremities: bilateral Pedal edema, no calf tenderness Neurologic: Grossly no focal neuro deficit. Bilaterally Equal motor strength  Data  Reviewed: CBC:  Recent Labs Lab 09/28/16 0519 09/29/16 0436 09/30/16 0505  WBC 7.7 15.3* 19.1*  NEUTROABS  --  13.5* 16.9*  HGB 13.8 13.4 13.7  HCT 42.6 41.5 42.5  MCV 96.6 96.3 97.0  PLT 193 225 063   Basic Metabolic Panel:  Recent Labs Lab 09/28/16 0519 09/29/16 0436 09/30/16 0505  NA 138 134* 133*  K 3.9 4.2 3.8  CL 95* 93* 91*  CO2 31 32 31  GLUCOSE 301* 384* 357*  BUN 13 29* 35*  CREATININE 0.72 0.89 0.89  CALCIUM 8.9 9.2 9.4  MG  --   --  1.8    Liver Function Tests:  Recent Labs Lab 09/28/16 0519 09/29/16 0436 09/30/16 0505  AST 29 23 25   ALT 23 19 20   ALKPHOS 52 52 46  BILITOT 0.6 0.7 0.6  PROT 7.4 7.1 7.6  ALBUMIN 3.5 3.5 3.8   No results for input(s): LIPASE, AMYLASE in the last 168 hours. No results for input(s): AMMONIA in the last 168 hours. Coagulation Profile: No results for input(s): INR, PROTIME in the last 168 hours. Cardiac Enzymes: No results for input(s): CKTOTAL, CKMB, CKMBINDEX, TROPONINI in the last 168 hours. BNP (last 3 results)  Recent Labs  10/09/15 1424  PROBNP 66.0   CBG:  Recent Labs Lab 09/29/16 1249 09/29/16 1714 09/29/16 2205 09/30/16 0811 09/30/16 1158  GLUCAP 399* 457* 433* 305* 394*   Studies: Dg Chest 2 View  Result Date: 09/30/2016 CLINICAL DATA:  Shortness of breath, cough, and wheezing. EXAM: CHEST  2 VIEW COMPARISON:  Chest x-ray of September 27, 2016 and CT scan of chest of December 28, 2015. FINDINGS: The lungs are adequately inflated. The interstitial markings remain increased. The cardiac silhouette remains enlarged. The pulmonary vascularity remains engorged. The bony thorax exhibits no acute abnormality. IMPRESSION: CHF with mild pulmonary interstitial edema. No alveolar infiltrate or pleural effusion. Electronically Signed   By: David  Martinique M.D.   On: 09/30/2016 12:00    Scheduled Meds: . albuterol  2.5 mg Nebulization Q4H WA  . aspirin EC  81 mg Oral Daily  . benzonatate  100 mg Oral TID  .  bisoprolol  5 mg Oral Daily  . [START ON 10/01/2016] enoxaparin (LOVENOX) injection  75 mg Subcutaneous Q24H  . FLUoxetine  20 mg Oral Daily  . fluticasone furoate-vilanterol  1 puff Inhalation Daily  . furosemide  40 mg Oral Daily  . guaiFENesin  1,200 mg Oral BID  . insulin aspart  0-20 Units Subcutaneous TID WC  . insulin aspart  0-5 Units Subcutaneous QHS  . insulin aspart  6 Units Subcutaneous TID WC  . insulin glargine  12 Units Subcutaneous Daily  . irbesartan  300 mg Oral QHS  . levofloxacin (LEVAQUIN) IV  500 mg Intravenous Q24H  . levothyroxine  224 mcg Oral QAC breakfast  . predniSONE  40 mg Oral Q breakfast  . primidone  125 mg Oral QHS  . simvastatin  40 mg Oral QHS  . sodium chloride flush  3 mL Intravenous Q12H  . spironolactone  25 mg Oral BID  .  tiotropium  18 mcg Inhalation Daily   Continuous Infusions: PRN Meds: sodium chloride, acetaminophen **OR** acetaminophen, albuterol, menthol-cetylpyridinium, sodium chloride, sodium chloride flush  Time spent: 30 minutes  Author: Berle Mull, MD [facility] Pager: [phone] 09/30/2016 4:38 PM  If 7PM-7AM, please contact night-coverage at [email or web address], password [facility]

## 2016-09-30 NOTE — Progress Notes (Signed)
Patient instructed in how to draw up and give insulin.  Patient able to give herself injection, with no issues.

## 2016-10-01 LAB — CBC
HEMATOCRIT: 43.1 % (ref 36.0–46.0)
HEMOGLOBIN: 14.1 g/dL (ref 12.0–15.0)
MCH: 31.8 pg (ref 26.0–34.0)
MCHC: 32.7 g/dL (ref 30.0–36.0)
MCV: 97.3 fL (ref 78.0–100.0)
Platelets: 233 10*3/uL (ref 150–400)
RBC: 4.43 MIL/uL (ref 3.87–5.11)
RDW: 12.9 % (ref 11.5–15.5)
WBC: 17.1 10*3/uL — ABNORMAL HIGH (ref 4.0–10.5)

## 2016-10-01 LAB — BASIC METABOLIC PANEL
Anion gap: 11 (ref 5–15)
BUN: 37 mg/dL — AB (ref 6–20)
CHLORIDE: 88 mmol/L — AB (ref 101–111)
CO2: 35 mmol/L — ABNORMAL HIGH (ref 22–32)
CREATININE: 0.91 mg/dL (ref 0.44–1.00)
Calcium: 9 mg/dL (ref 8.9–10.3)
GFR calc Af Amer: >60 mL/min (ref >60)
GFR calc non Af Amer: >60 mL/min (ref >60)
Glucose, Bld: 196 mg/dL — ABNORMAL HIGH (ref 65–99)
Potassium: 3.2 mmol/L — ABNORMAL LOW (ref 3.5–5.1)
SODIUM: 134 mmol/L — AB (ref 135–145)

## 2016-10-01 LAB — GLUCOSE, CAPILLARY
GLUCOSE-CAPILLARY: 192 mg/dL — AB (ref 65–99)
GLUCOSE-CAPILLARY: 340 mg/dL — AB (ref 65–99)

## 2016-10-01 MED ORDER — LEVOFLOXACIN 500 MG PO TABS: 500.0000 mg | ORAL_TABLET | Freq: Every day | ORAL | Status: DC

## 2016-10-01 MED ORDER — FLUTICASONE PROPIONATE HFA 220 MCG/ACT IN AERO: 2.0000 | INHALATION_SPRAY | Freq: Two times a day (BID) | RESPIRATORY_TRACT | 0 refills | Status: DC

## 2016-10-01 MED ORDER — METFORMIN HCL ER (MOD) 1000 MG PO TB24: 1000.0000 mg | ORAL_TABLET | Freq: Every day | ORAL | 0 refills | Status: DC

## 2016-10-01 MED ORDER — LEVOFLOXACIN 500 MG PO TABS: 500.0000 mg | ORAL_TABLET | Freq: Every day | ORAL | 0 refills | Status: AC

## 2016-10-01 MED ORDER — LEVOTHYROXINE SODIUM 112 MCG PO TABS: 224.0000 ug | ORAL_TABLET | Freq: Every day | ORAL | 0 refills | Status: DC

## 2016-10-01 MED ORDER — PREDNISONE 10 MG PO TABS: ORAL_TABLET | ORAL | 0 refills | Status: DC

## 2016-10-01 MED ORDER — ALBUTEROL SULFATE (2.5 MG/3ML) 0.083% IN NEBU: 2.5000 mg | INHALATION_SOLUTION | Freq: Four times a day (QID) | RESPIRATORY_TRACT | Status: DC

## 2016-10-01 MED ORDER — ALBUTEROL SULFATE (2.5 MG/3ML) 0.083% IN NEBU: 2.5000 mg | INHALATION_SOLUTION | RESPIRATORY_TRACT | Status: DC | PRN

## 2016-10-01 MED ORDER — MONTELUKAST SODIUM 10 MG PO TABS: 10.0000 mg | ORAL_TABLET | Freq: Every day | ORAL | 0 refills | Status: DC

## 2016-10-01 MED ORDER — ALBUTEROL SULFATE (2.5 MG/3ML) 0.083% IN NEBU: 2.5000 mg | INHALATION_SOLUTION | Freq: Four times a day (QID) | RESPIRATORY_TRACT | 0 refills | Status: DC | PRN

## 2016-10-01 MED ORDER — IRBESARTAN 300 MG PO TABS: 300.0000 mg | ORAL_TABLET | Freq: Every day | ORAL | 0 refills | Status: DC

## 2016-10-01 MED ORDER — GLIPIZIDE 10 MG PO TABS: 10.0000 mg | ORAL_TABLET | Freq: Two times a day (BID) | ORAL | 0 refills | Status: DC

## 2016-10-01 MED ORDER — SODIUM CHLORIDE 0.9 % IV SOLN: 30.0000 meq | Freq: Once | INTRAVENOUS | Status: DC

## 2016-10-01 MED ORDER — POTASSIUM CHLORIDE 10 MEQ/100ML IV SOLN
10.0000 meq | INTRAVENOUS | Status: AC
  Administered 2016-10-01 (×3): 10 meq via INTRAVENOUS
  Filled 2016-10-01 (×3): qty 100

## 2016-10-01 NOTE — Discharge Summary (Signed)
Physician Discharge Summary  Autumn Massey  MHD:[id number]  DOB: 10/22/1965  DOA: 09/27/2016 PCP: [name], MD  Admit date: 09/27/2016 Discharge date: 10/01/2016  Admitted From: Home  Disposition:  Home   Recommendations for Outpatient Follow-up:  1. Follow up with PCP in 1-2 weeks 2. Please obtain BMP/CBC in one week  Discharge Condition: Improved  CODE STATUS: FULL  Diet recommendation: Heart Healthy / Carb Modified    Brief/Interim Summary: 51 y/o F with PMHx of OSA on O2 2L [place] at bed time, asthma, HTN, DM type 2 and morbid obesity presented to the ED with SOB, and hypoxia to 82%. Patient placed her O2 but didn't have significant improvement. Patient was admitted for asthma exacerbation with ? right basilar infiltrates. Patient was started on nebulizer treatment and IV abx . Repeated CXR did not show signs of infection. Patient was also started on steroids. Patient subsequently improved and was discharge home with prednisone taper, complete course of abx and nebulizer treatment. Patient's glucose was found to be elevated for which she was started on Insulin. Patient was seen by endocrinology which started patient on Januvia but she has not filled the prescription. Patient CBG's were stable and was discharge on metformin and Glipizide.    Subjective: Patient seen and examined, feeling much better, wants to go home. C/o mild wheezing. Afebrile, cough has improved   Discharge Diagnoses/Hospital Course:  Acute Asthma exacerbation Initially treated with IV solumedrol - transitioned to oral prednisone, will continue taper for 14 days. Continue inhalers and albuterol as needed  Continue Levaquin for 2 more day  Will start on singular 10 mg at bedtime  Off O2 while awake   Acute on Chronic diastolic CHF. Patient is taking Lasix, Aldactone, bystolic. Echogram shows diastolic dysfunction, normal EF. Chest x-ray shows mild vascular congestion, patient actively losing weight in the  hospital Follow up with PCP   Hypothyroidism. Continue Synthroid.  Type 2 diabetes mellitus, uncontrolled with hyperglycemia.  Likely steroid contributing to uncontrolled CBG  Hemoglobin A1c 9.6. Patient is on metformin which was recently started as well as Januvia which was also recently started , although patient has not taken It due to insurance issue  Patient was treated with insulin, but to insurance issues she will not be able to afford insulin Will increase metformin to 1000 mg daily and will add glipizide 10 mg BID  Follow up with Endocrinologist   All other chronic medical condition were stable during the hospitalization.  On the day of the discharge the patient's vitals were stable, and no other acute medical condition were reported by patient. Patient was felt safe to be discharge to home   Discharge Instructions  You were cared for by a hospitalist during your hospital stay. If you have any questions about your discharge medications or the care you received while you were in the hospital after you are discharged, you can call the unit and asked to speak with the hospitalist on call if the hospitalist that took care of you is not available. Once you are discharged, your primary care physician will handle any further medical issues. Please note that NO REFILLS for any discharge medications will be authorized once you are discharged, as it is imperative that you return to your primary care physician (or establish a relationship with a primary care physician if you do not have one) for your aftercare needs so that they can reassess your need for medications and monitor your lab values.  Discharge Instructions  Call MD for:  difficulty breathing, headache or visual disturbances    Complete by:  As directed    Call MD for:  extreme fatigue    Complete by:  As directed    Call MD for:  hives    Complete by:  As directed    Call MD for:  persistant dizziness or light-headedness     Complete by:  As directed    Call MD for:  persistant nausea and vomiting    Complete by:  As directed    Call MD for:  redness, tenderness, or signs of infection (pain, swelling, redness, odor or green/yellow discharge around incision site)    Complete by:  As directed    Call MD for:  severe uncontrolled pain    Complete by:  As directed    Call MD for:  temperature >100.4    Complete by:  As directed    Diet - low sodium heart healthy    Complete by:  As directed    Increase activity slowly    Complete by:  As directed      Allergies as of 10/01/2016   No Known Allergies     Medication List    STOP taking these medications   guaifenesin 100 MG/5ML syrup Commonly known as:  ROBITUSSIN   sitaGLIPtin 100 MG tablet Commonly known as:  JANUVIA     TAKE these medications   acetaminophen 500 MG tablet Commonly known as:  TYLENOL Take 500 mg by mouth every 6 (six) hours as needed for mild pain or headache.   albuterol 108 (90 Base) MCG/ACT inhaler Commonly known as:  PROVENTIL HFA;VENTOLIN HFA Inhale 2 puffs into the lungs every 6 (six) hours as needed for wheezing or shortness of breath. What changed:  Another medication with the same name was added. Make sure you understand how and when to take each.   albuterol (2.5 MG/3ML) 0.083% nebulizer solution Commonly known as:  PROVENTIL Take 3 mLs (2.5 mg total) by nebulization every 6 (six) hours as needed for wheezing or shortness of breath. What changed:  You were already taking a medication with the same name, and this prescription was added. Make sure you understand how and when to take each.   aspirin EC 81 MG tablet Take 81 mg by mouth daily.   bisoprolol 5 MG tablet Commonly known as:  ZEBETA Take 1 tablet (5 mg total) by mouth daily.   dextromethorphan-guaiFENesin 30-600 MG 12hr tablet Commonly known as:  MUCINEX DM Take 1 tablet by mouth 2 (two) times daily as needed for cough.   FLUoxetine 20 MG  capsule Commonly known as:  PROZAC TAKE ONE CAPSULE BY MOUTH DAILY. Marcus appointment. FOR FUTURE REFILLS.   fluticasone 220 MCG/ACT inhaler Commonly known as:  FLOVENT HFA Inhale 2 puffs into the lungs 2 (two) times daily.   fluticasone furoate-vilanterol 100-25 MCG/INH Aepb Commonly known as:  BREO ELLIPTA Inhale 1 puff into the lungs daily.   furosemide 40 MG tablet Commonly known as:  LASIX TAKE ONE TABLET BY MOUTH EVERY DAY. Must keep appointment FOR future refills   GARLIC PO Take 1 tablet by mouth daily.   glipiZIDE 10 MG tablet Commonly known as:  GLUCOTROL Take 1 tablet (10 mg total) by mouth 2 (two) times daily before a meal.   glucose blood test strip Use to test blood sugar 1 time daily as instructed.   hydrochlorothiazide 25 MG tablet Commonly known as:  HYDRODIURIL TAKE ONE TABLET  BY MOUTH EVERY DAY   irbesartan 300 MG tablet Commonly known as:  AVAPRO Take 1 tablet (300 mg total) by mouth at bedtime. What changed:  See the new instructions.   levofloxacin 500 MG tablet Commonly known as:  LEVAQUIN Take 1 tablet (500 mg total) by mouth at bedtime.   levothyroxine 112 MCG tablet Commonly known as:  SYNTHROID, LEVOTHROID Take 2 tablets (224 mcg total) by mouth daily before breakfast. Start taking on:  10/02/2016 What changed:  how much to take  how to take this  when to take this  additional instructions   metFORMIN 1000 MG (MOD) 24 hr tablet Commonly known as:  GLUMETZA Take 1 tablet (1,000 mg total) by mouth daily with breakfast. What changed:  medication strength  how much to take   montelukast 10 MG tablet Commonly known as:  SINGULAIR Take 1 tablet (10 mg total) by mouth at bedtime.   NON FORMULARY Oxygen 2L use as bedtime   pantoprazole 40 MG tablet Commonly known as:  PROTONIX 1 tablet 30-60 min before first meal of the day   predniSONE 10 MG tablet Commonly known as:  DELTASONE Take 4 tablets for 3 days; Take 3 tablets  for 4 days; Take 2 tablets for 3 days; Take 1 tablet for 4 days   primidone 50 MG tablet Commonly known as:  MYSOLINE TAKE 2 AND 1/2 TABLETS BY MOUTH AT BEDTIME. Please schedule follow up visit   simvastatin 40 MG tablet Commonly known as:  ZOCOR Take 1 tablet (40 mg total) by mouth at bedtime. Keep July appt for future refills   spironolactone 25 MG tablet Commonly known as:  ALDACTONE TAKE ONE TABLET BY MOUTH TWICE DAILY.            Durable Medical Equipment        Start     Ordered   10/01/16 1412  For home use only DME Nebulizer machine  Once    Question:  Patient needs a nebulizer to treat with the following condition  Answer:  Asthma   10/01/16 1412     Follow-up Information    [name], MD.   Specialties:  Internal Medicine, Radiology Contact information: [place] [address] [phone]          No Known Allergies  Consultations:  None    Procedures/Studies: Dg Chest 2 View  Result Date: 09/30/2016 CLINICAL DATA:  Shortness of breath, cough, and wheezing. EXAM: CHEST  2 VIEW COMPARISON:  Chest x-ray of September 27, 2016 and CT scan of chest of December 28, 2015. FINDINGS: The lungs are adequately inflated. The interstitial markings remain increased. The cardiac silhouette remains enlarged. The pulmonary vascularity remains engorged. The bony thorax exhibits no acute abnormality. IMPRESSION: CHF with mild pulmonary interstitial edema. No alveolar infiltrate or pleural effusion. Electronically Signed   By: David  Martinique M.D.   On: 09/30/2016 12:00   Dg Chest 2 View  Result Date: 09/27/2016 CLINICAL DATA:  Acute onset of shortness of breath and wheezing. Initial encounter. EXAM: CHEST  2 VIEW COMPARISON:  Chest radiograph and CT of the chest performed 12/28/2015 FINDINGS: The lungs are well-aerated. Vascular congestion is noted. Increased interstitial markings raise concern for pulmonary edema. Underlying right basilar pneumonia cannot be  excluded. No pleural effusion or pneumothorax is seen. The heart is normal in size; the mediastinal contour is within normal limits. No acute osseous abnormalities are seen. IMPRESSION: Vascular congestion. Increased interstitial markings raise concern  for pulmonary edema. Underlying right basilar pneumonia cannot be excluded. Electronically Signed   By: Garald Balding M.D.   On: 09/27/2016 23:23    ECHO 09/29/16 ------------------------------------------------------------------- Study Conclusions  - Procedure narrative: Transthoracic echocardiography. Image   quality was suboptimal. The study was technically difficult, as a   result of poor acoustic windows and body habitus. Intravenous   contrast (Definity) was administered. - Left ventricle: The cavity size was normal. Wall thickness was   increased in a pattern of mild LVH. Systolic function was normal.   The estimated ejection fraction was in the range of 60% to 65%.   There is diastolic dysfunction with indeterminate LV filling   pressure. - Systemic veins: Not visualized.  Impressions:  - Very technically difficult study due to body habitus. Definity   contrast given, but images still difficult to interpret. LVEF   60-65%, mild LVH, diastolic dysfunction with indeterminate LV   filling pressure, no pericardial effusion.  Discharge Exam: Vitals:   10/01/16 0700 10/01/16 0843  BP: (!) 112/56   Pulse: 65 63  Resp:  18  Temp: 97.4 F (36.3 C)    Vitals:   10/01/16 0419 10/01/16 0649 10/01/16 0700 10/01/16 0843  BP:  137/64 (!) 112/56   Pulse:  65 65 63  Resp:  18  18  Temp:  98 F (36.7 C) 97.4 F (36.3 C)   TempSrc:  Oral Oral   SpO2: 96% 94% 94% 96%  Weight:  (!) 150.5 kg (331 lb 11.2 oz)    Height:        General: Pt is alert, awake, not in acute distress Cardiovascular: RRR, S1/S2 +, no rubs, no gallops Respiratory: Good air entry b/l, force expiratory wheezing mainly at the upper fields.  Abdominal: Soft,  NT, ND, bowel sounds + Extremities: no edema, no cyanosis   The results of significant diagnostics from this hospitalization (including imaging, microbiology, ancillary and laboratory) are listed below for reference.     Microbiology: No results found for this or any previous visit (from the past 240 hour(s)).   Labs: BNP (last 3 results)  Recent Labs  12/28/15 1426 09/29/16 0436  BNP 25.6 02.6   Basic Metabolic Panel:  Recent Labs Lab 09/28/16 0519 09/29/16 0436 09/30/16 0505 10/01/16 0500  NA 138 134* 133* 134*  K 3.9 4.2 3.8 3.2*  CL 95* 93* 91* 88*  CO2 31 32 31 35*  GLUCOSE 301* 384* 357* 196*  BUN 13 29* 35* 37*  CREATININE 0.72 0.89 0.89 0.91  CALCIUM 8.9 9.2 9.4 9.0  MG  --   --  1.8  --    Liver Function Tests:  Recent Labs Lab 09/28/16 0519 09/29/16 0436 09/30/16 0505  AST 29 23 25   ALT 23 19 20   ALKPHOS 52 52 46  BILITOT 0.6 0.7 0.6  PROT 7.4 7.1 7.6  ALBUMIN 3.5 3.5 3.8   No results for input(s): LIPASE, AMYLASE in the last 168 hours. No results for input(s): AMMONIA in the last 168 hours. CBC:  Recent Labs Lab 09/28/16 0519 09/29/16 0436 09/30/16 0505 10/01/16 0500  WBC 7.7 15.3* 19.1* 17.1*  NEUTROABS  --  13.5* 16.9*  --   HGB 13.8 13.4 13.7 14.1  HCT 42.6 41.5 42.5 43.1  MCV 96.6 96.3 97.0 97.3  PLT 193 225 239 233   Cardiac Enzymes: No results for input(s): CKTOTAL, CKMB, CKMBINDEX, TROPONINI in the last 168 hours. BNP: Invalid input(s): POCBNP CBG:  Recent Labs Lab 09/30/16  1158 09/30/16 1723 09/30/16 2106 10/01/16 0819 10/01/16 1224  GLUCAP 394* 332* 363* 192* 340*   D-Dimer No results for input(s): DDIMER in the last 72 hours. Hgb A1c  Recent Labs  09/29/16 0436  HGBA1C 9.5*   Lipid Profile No results for input(s): CHOL, HDL, LDLCALC, TRIG, CHOLHDL, LDLDIRECT in the last 72 hours. Thyroid function studies No results for input(s): TSH, T4TOTAL, T3FREE, THYROIDAB in the last 72 hours.  Invalid input(s):  FREET3 Anemia work up No results for input(s): VITAMINB12, FOLATE, FERRITIN, TIBC, IRON, RETICCTPCT in the last 72 hours. Urinalysis    Component Value Date/Time   COLORURINE YELLOW 11/22/2014 [place] 11/22/2014 1403   LABSPEC 1.025 11/22/2014 1403   PHURINE 6.0 11/22/2014 1403   GLUCOSEU NEGATIVE 11/22/2014 1403   HGBUR TRACE-INTACT (A) 11/22/2014 1403   BILIRUBINUR NEGATIVE 11/22/2014 1403   KETONESUR NEGATIVE 11/22/2014 1403   UROBILINOGEN 0.2 11/22/2014 1403   NITRITE NEGATIVE 11/22/2014 1403   LEUKOCYTESUR SMALL (A) 11/22/2014 1403   Sepsis Labs Invalid input(s): PROCALCITONIN,  WBC,  LACTICIDVEN Microbiology No results found for this or any previous visit (from the past 240 hour(s)).   Time coordinating discharge: 42 minutes  SIGNED:  Chipper Oman, MD  [facility] 10/01/2016, 3:12 PM  Pager please text page via  [email or web address] Password [email or web address]

## 2016-10-01 NOTE — Progress Notes (Signed)
Inpatient Diabetes Program Recommendations  AACE/ADA: New Consensus Statement on Inpatient Glycemic Control (2015)  Target Ranges:  Prepandial:   less than 140 mg/dL      Peak postprandial:   less than 180 mg/dL (1-2 hours)      Critically ill patients:  140 - 180 mg/dL   Lab Results  Component Value Date   GLUCAP 340 (H) 10/01/2016   HGBA1C 9.5 (H) 09/29/2016    Review of Glycemic Control  Diabetes history: DM2 Outpatient Diabetes medications: metformin 500 mg QAM, Januvia 100 mg QD (not taking) Current orders for Inpatient glycemic control: Lantus 12 units QD, Novolog 0-20 units tidwc and hs + 6 units tidwc, metformin 500 mg QD  Pt was able to draw up and give insulin to herself, with no issues, per RN. Discussed HgbA1C of 9.2% and goal of 7% to prevent complications. With questionable insurance coverage, would recommend an affordable insulin such as Novolin 70/30, which is $24.99 at [facility]. Spoke with pt about importance of controlling her blood sugars to prevent long-term complications. Discussed how steroids impact her blood sugars and importance of glucose monitoring at home. Will need to f/u with PCP for diabetes management.   Inpatient Diabetes Program Recommendations:    To be discharged on: Glipizide 10 mg bid, Glumetza 1000 mg QD with breakfast Recommend Novolin 70/30 10 units bid for home.  Will follow.  Thank you. Lorenda Peck, RD, LDN, CDE Inpatient Diabetes Coordinator [phone]

## 2016-10-01 NOTE — Progress Notes (Signed)
Spoke with pt concerning HH and medication needs.  Pt states that she can afford medications on $4 list. Pt asked for ne machine. There are no HH needs.

## 2016-10-01 NOTE — Progress Notes (Signed)
Report received from [name],RN. No change in assessment. Autumn Massey 

## 2016-10-09 ENCOUNTER — Ambulatory Visit (INDEPENDENT_AMBULATORY_CARE_PROVIDER_SITE_OTHER): Payer: Self-pay | Admitting: Internal Medicine

## 2016-10-09 ENCOUNTER — Other Ambulatory Visit: Payer: Self-pay | Admitting: Internal Medicine

## 2016-10-09 ENCOUNTER — Encounter: Payer: Self-pay | Admitting: Internal Medicine

## 2016-10-09 ENCOUNTER — Other Ambulatory Visit (INDEPENDENT_AMBULATORY_CARE_PROVIDER_SITE_OTHER): Payer: Self-pay

## 2016-10-09 VITALS — BP 138/76 | HR 84 | Temp 98.4°F | Ht 62.0 in | Wt 322.2 lb

## 2016-10-09 DIAGNOSIS — I5032 Chronic diastolic (congestive) heart failure: Secondary | ICD-10-CM

## 2016-10-09 DIAGNOSIS — D72829 Elevated white blood cell count, unspecified: Secondary | ICD-10-CM

## 2016-10-09 DIAGNOSIS — J45901 Unspecified asthma with (acute) exacerbation: Secondary | ICD-10-CM

## 2016-10-09 DIAGNOSIS — R110 Nausea: Secondary | ICD-10-CM

## 2016-10-09 DIAGNOSIS — J449 Chronic obstructive pulmonary disease, unspecified: Secondary | ICD-10-CM

## 2016-10-09 DIAGNOSIS — E871 Hypo-osmolality and hyponatremia: Secondary | ICD-10-CM

## 2016-10-09 DIAGNOSIS — R252 Cramp and spasm: Secondary | ICD-10-CM | POA: Insufficient documentation

## 2016-10-09 LAB — CBC WITH DIFFERENTIAL/PLATELET
BASOS ABS: 0.1 10*3/uL (ref 0.0–0.1)
Basophils Relative: 0.3 % (ref 0.0–3.0)
EOS PCT: 0.5 % (ref 0.0–5.0)
Eosinophils Absolute: 0.1 10*3/uL (ref 0.0–0.7)
HEMATOCRIT: 50.3 % — AB (ref 36.0–46.0)
Hemoglobin: 16.4 g/dL — ABNORMAL HIGH (ref 12.0–15.0)
Lymphocytes Relative: 9.5 % — ABNORMAL LOW (ref 12.0–46.0)
Lymphs Abs: 2 10*3/uL (ref 0.7–4.0)
MCHC: 32.7 g/dL (ref 30.0–36.0)
MCV: 94.5 fl (ref 78.0–100.0)
MONOS PCT: 7.1 % (ref 3.0–12.0)
Monocytes Absolute: 1.5 10*3/uL — ABNORMAL HIGH (ref 0.1–1.0)
NEUTROS ABS: 17.4 10*3/uL — AB (ref 1.4–7.7)
Neutrophils Relative %: 82.6 % — ABNORMAL HIGH (ref 43.0–77.0)
PLATELETS: 259 10*3/uL (ref 150.0–400.0)
RBC: 5.32 Mil/uL — AB (ref 3.87–5.11)
RDW: 13 % (ref 11.5–15.5)

## 2016-10-09 LAB — BASIC METABOLIC PANEL
BUN: 54 mg/dL — ABNORMAL HIGH (ref 6–23)
CALCIUM: 10.2 mg/dL (ref 8.4–10.5)
CO2: 34 mEq/L — ABNORMAL HIGH (ref 19–32)
Chloride: 84 mEq/L — ABNORMAL LOW (ref 96–112)
Creatinine, Ser: 1.08 mg/dL (ref 0.40–1.20)
GFR: 56.89 mL/min — AB (ref >60.00)
GLUCOSE: 222 mg/dL — AB (ref 70–99)
POTASSIUM: 4.2 meq/L (ref 3.5–5.1)
SODIUM: 126 meq/L — AB (ref 135–145)

## 2016-10-09 LAB — MAGNESIUM: Magnesium: 1.9 mg/dL (ref 1.5–2.5)

## 2016-10-09 MED ORDER — CYCLOBENZAPRINE HCL 5 MG PO TABS: 5.0000 mg | ORAL_TABLET | Freq: Three times a day (TID) | ORAL | 1 refills | Status: DC | PRN

## 2016-10-09 MED ORDER — ONDANSETRON 4 MG PO TBDP: 4.0000 mg | ORAL_TABLET | Freq: Three times a day (TID) | ORAL | 1 refills | Status: DC | PRN

## 2016-10-09 NOTE — Assessment & Plan Note (Signed)
Ok for cbc, lyted, cr, magnesium , cont same tx for now

## 2016-10-09 NOTE — Assessment & Plan Note (Signed)
Etiology unclear, but not likely reflux, for zofran prn,  to f/u any worsening symptoms or concerns

## 2016-10-09 NOTE — Assessment & Plan Note (Signed)
stable overall by history and exam, and pt to continue medical treatment as before,  to f/u any worsening symptoms or concerns 

## 2016-10-09 NOTE — Patient Instructions (Addendum)
Please take all new medication as prescribed - the zofran for nausea, and the flexeril at night for cramps  Please continue all other medications as before, and refills have been done if requested.  Please have the pharmacy call with any other refills you may need.  Please keep your appointments with your specialists as you may have planned  Please go to the LAB in the Basement (turn left off the elevator) for the tests to be done today  You will be contacted by phone if any changes need to be made immediately.  Otherwise, you will receive a letter about your results with an explanation, but please check with MyChart first.  Please remember to sign up for MyChart if you have not done so, as this will be important to you in the future with finding out test results, communicating by private email, and scheduling acute appointments online when needed.  Please return in 6 months, or sooner if needed

## 2016-10-09 NOTE — Progress Notes (Signed)
Pre visit review using our clinic review tool, if applicable. No additional management support is needed unless otherwise documented below in the visit note. 

## 2016-10-09 NOTE — Progress Notes (Signed)
Subjective:    Patient ID: Autumn Massey, female    DOB: March 09, 1966, 51 y.o.   MRN: [id number]  HPI Here post hospn mar 31-apr 4 with acute asthma exacerbation and acute on chronic diast chf.  Required tx with IV steroid, inhalers and then po steroid, levaquin and started on singulair 10 mg .  Echo showed diast dysfxn, normal EF , cxr with mild vasc congestion and tx with lasix /aldactone/bystolic.  Sugars not well controlled with a1c 9.6, possibly related to steroid use.   Januvia stopped, metfomrin increased and glipizide high dose with planned fu with endo. Rec' for f/u cbc/bmp today.  Pt denies chest pain, increased sob or doe, wheezing, orthopnea, PND, increased LE swelling, palpitations, dizziness or syncope. Has overall lost 24 lbs from 342 gradyually but over a short time of 1 mo, with strict attention.  Has had NO fast fodd in 3 wks, o/w all about egg whites and better diet.  Most of her wt loss has been fat she thinks.  Has cont'd no LE edema to date since d/c from hospital. Wt Readings from Last 3 Encounters:  10/09/16 (!) 322 lb 3.2 oz (146.1 kg)  10/01/16 (!) 331 lb 11.2 oz (150.5 kg)  09/16/16 (!) 342 lb (155.1 kg)  Her c/o today is of intermittent waves of nausea, sweating, tingling lips, mod to severe leg cramps and sleepiness.  Leg cramps  Very bad to legs last 3 nights, cant sleep.  No energy at all, just wants to sleep.  cbgs per pt only low 100;s.  Finishing the prednisone in the next few days.  Just feels overal drained, fatigue, sleepy.  Has home o2 2L qhs which she is compliant.  Has really cut back on salt intake, including no fast food.   Past Medical History:  Diagnosis Date  . ANXIETY DEPRESSION 01/29/2009  . DEPRESSION 11/19/2007  . FREQUENCY, URINARY 10/17/2009  . HYPERLIPIDEMIA 11/19/2007  . HYPERTENSION 11/19/2007  . HYPOTHYROIDISM 11/19/2007  . Morbid obesity ([place])   . PNEUMONIA 08/06/2010  . POLYCYTHEMIA 01/29/2009  . Preventative health care 04/06/2011  . Sleep apnea     mild sleep apnea, on o2 at 2l at nighttime  . SLEEP RELATED HYPOVENTILATION/HYPOXEMIA CCE 02/08/2009  . Venous insufficiency 07/19/2012   Past Surgical History:  Procedure Laterality Date  . TONSILLECTOMY    . ULNAR TUNNEL RELEASE Left 02/22/2013   Procedure: LEFT ULNAR NERVE DECOMPRESSION ;  Surgeon: Tennis Must, MD;  Location: [facility];  Service: Orthopedics;  Laterality: Left;    reports that she quit smoking about 16 months ago. Her smoking use included Cigarettes. She has a 11.00 pack-year smoking history. She has never used smokeless tobacco. She reports that she drinks alcohol. She reports that she does not use drugs. family history includes Alcohol abuse in her other; Arthritis in her other; Cancer in her maternal grandfather and mother; Hypertension in her other. No Known Allergies Current Outpatient Prescriptions on File Prior to Visit  Medication Sig Dispense Refill  . acetaminophen (TYLENOL) 500 MG tablet Take 500 mg by mouth every 6 (six) hours as needed for mild pain or headache.    . albuterol (PROVENTIL) (2.5 MG/3ML) 0.083% nebulizer solution Take 3 mLs (2.5 mg total) by nebulization every 6 (six) hours as needed for wheezing or shortness of breath. 75 mL 0  . aspirin EC 81 MG tablet Take 81 mg by mouth daily.    . bisoprolol (ZEBETA) 5 MG tablet Take 1 tablet (  5 mg total) by mouth daily. 30 tablet 11  . dextromethorphan-guaiFENesin (MUCINEX DM) 30-600 MG 12hr tablet Take 1 tablet by mouth 2 (two) times daily as needed for cough.    [name] FLUoxetine (PROZAC) 20 MG capsule TAKE ONE CAPSULE BY MOUTH DAILY. [place] appointment. FOR FUTURE REFILLS. 90 capsule 2  . fluticasone (FLOVENT HFA) 220 MCG/ACT inhaler Inhale 2 puffs into the lungs 2 (two) times daily. 1 Inhaler 0  . furosemide (LASIX) 40 MG tablet TAKE ONE TABLET BY MOUTH EVERY DAY. Must keep appointment FOR future refills 30 tablet 3  . GARLIC PO Take 1 tablet by mouth daily.    [name] glipiZIDE (GLUCOTROL) 10  MG tablet Take 1 tablet (10 mg total) by mouth 2 (two) times daily before a meal. 60 tablet 0  . glucose blood test strip Use to test blood sugar 1 time daily as instructed. 100 each 3  . hydrochlorothiazide (HYDRODIURIL) 25 MG tablet TAKE ONE TABLET BY MOUTH EVERY DAY 30 tablet 3  . irbesartan (AVAPRO) 300 MG tablet Take 1 tablet (300 mg total) by mouth at bedtime. 30 tablet 0  . levothyroxine (SYNTHROID, LEVOTHROID) 112 MCG tablet Take 2 tablets (224 mcg total) by mouth daily before breakfast. 30 tablet 0  . montelukast (SINGULAIR) 10 MG tablet Take 1 tablet (10 mg total) by mouth at bedtime. 30 tablet 0  . NON FORMULARY Oxygen 2L use as bedtime    . pantoprazole (PROTONIX) 40 MG tablet 1 tablet 30-60 min before first meal of the day 30 tablet 2  . predniSONE (DELTASONE) 10 MG tablet Take 4 tablets for 3 days; Take 3 tablets for 4 days; Take 2 tablets for 3 days; Take 1 tablet for 4 days 34 tablet 0  . primidone (MYSOLINE) 50 MG tablet TAKE 2 AND 1/2 TABLETS BY MOUTH AT BEDTIME. Please schedule follow up visit 75 tablet 0  . simvastatin (ZOCOR) 40 MG tablet Take 1 tablet (40 mg total) by mouth at bedtime. Keep July appt for future refills 30 tablet 0  . spironolactone (ALDACTONE) 25 MG tablet TAKE ONE TABLET BY MOUTH TWICE DAILY. 180 tablet 2   No current facility-administered medications on file prior to visit.    Review of Systems  Constitutional: Negative for other unusual diaphoresis or sweats HENT: Negative for ear discharge or swelling Eyes: Negative for other worsening visual disturbances Respiratory: Negative for stridor or other swelling  Gastrointestinal: Negative for worsening distension or other blood Genitourinary: Negative for retention or other urinary change Musculoskeletal: Negative for other MSK pain or swelling Skin: Negative for color change or other new lesions Neurological: Negative for worsening tremors and other numbness  Psychiatric/Behavioral: Negative for  worsening agitation or other fatigue All other system neg per pt    Objective:   Physical Exam BP 138/76 (BP Location: Left Arm, Patient Position: Sitting, Cuff Size: Large)   Pulse 84   Temp 98.4 F (36.9 C) (Oral)   Ht 5\' 2"  (1.575 m)   Wt (!) 322 lb 3.2 oz (146.1 kg)   SpO2 98%   BMI 58.93 kg/m  VS noted, not ill appearing Constitutional: Pt appears in NAD HENT: Head: NCAT.  Right Ear: External ear normal.  Left Ear: External ear normal.  Eyes: . Pupils are equal, round, and reactive to light. Conjunctivae and EOM are normal Nose: without d/c or deformity Neck: Neck supple. Gross normal ROM Cardiovascular: Normal rate and regular rhythm.   Pulmonary/Chest: Effort normal and breath sounds without rales or  wheezing.  Abd:  Soft, NT, ND, + BS, no organomegaly Neurological: Pt is alert. At baseline orientation, motor grossly intact Skin: Skin is warm. No rashes, other new lesions, no LE edema Psychiatric: Pt behavior is normal without agitation  No other exam findings    Assessment & Plan:

## 2016-10-10 ENCOUNTER — Telehealth: Payer: Self-pay

## 2016-10-10 NOTE — Telephone Encounter (Signed)
Pt has reviewed results via MyChart.  

## 2016-10-10 NOTE — Telephone Encounter (Signed)
-----   Message from Biagio Borg, MD sent at 10/09/2016  7:00 PM EDT ----- Left message on MyChart, pt to cont same tx except  The test results show that your current treatment is OK., except the sodium level is quite low but not yet to the "danger" level.  This is very likely assoc with your symptoms you mentioned at your visit.  The low sodium level is most likely due to cutting drastically back on the salt in your diet as you mentioned, as well as taking the lasix and Hydrochlorothiazide.  Please allow yourself to eat a bit more sodium in the diet for now, and HOLD on taking the lasix and HCT until Monday Apr 16.  Then please return for a repeat blood test on Mon or Tues next week.    Autumn Massey to please inform pt, I will do order for BMP and CBC for next wk

## 2016-10-14 ENCOUNTER — Other Ambulatory Visit: Payer: Self-pay

## 2016-10-14 ENCOUNTER — Inpatient Hospital Stay (HOSPITAL_COMMUNITY)
Admission: EM | Admit: 2016-10-14 | Discharge: 2016-10-16 | DRG: 683 | Disposition: A | Discharge status: Home / Self Care | Payer: Medicare Other | Attending: Family Medicine | Admitting: Family Medicine

## 2016-10-14 ENCOUNTER — Other Ambulatory Visit (INDEPENDENT_AMBULATORY_CARE_PROVIDER_SITE_OTHER): Payer: Self-pay

## 2016-10-14 DIAGNOSIS — I10 Essential (primary) hypertension: Secondary | ICD-10-CM | POA: Diagnosis present

## 2016-10-14 DIAGNOSIS — N179 Acute kidney failure, unspecified: Principal | ICD-10-CM | POA: Diagnosis present

## 2016-10-14 DIAGNOSIS — Z808 Family history of malignant neoplasm of other organs or systems: Secondary | ICD-10-CM

## 2016-10-14 DIAGNOSIS — Z7951 Long term (current) use of inhaled steroids: Secondary | ICD-10-CM

## 2016-10-14 DIAGNOSIS — E860 Dehydration: Secondary | ICD-10-CM | POA: Diagnosis present

## 2016-10-14 DIAGNOSIS — I110 Hypertensive heart disease with heart failure: Secondary | ICD-10-CM | POA: Diagnosis present

## 2016-10-14 DIAGNOSIS — I5032 Chronic diastolic (congestive) heart failure: Secondary | ICD-10-CM | POA: Diagnosis present

## 2016-10-14 DIAGNOSIS — Z9889 Other specified postprocedural states: Secondary | ICD-10-CM

## 2016-10-14 DIAGNOSIS — Z8673 Personal history of transient ischemic attack (TIA), and cerebral infarction without residual deficits: Secondary | ICD-10-CM

## 2016-10-14 DIAGNOSIS — Z79899 Other long term (current) drug therapy: Secondary | ICD-10-CM

## 2016-10-14 DIAGNOSIS — D72829 Elevated white blood cell count, unspecified: Secondary | ICD-10-CM

## 2016-10-14 DIAGNOSIS — E785 Hyperlipidemia, unspecified: Secondary | ICD-10-CM | POA: Diagnosis present

## 2016-10-14 DIAGNOSIS — J440 Chronic obstructive pulmonary disease with acute lower respiratory infection: Secondary | ICD-10-CM | POA: Diagnosis present

## 2016-10-14 DIAGNOSIS — F341 Dysthymic disorder: Secondary | ICD-10-CM | POA: Diagnosis present

## 2016-10-14 DIAGNOSIS — E871 Hypo-osmolality and hyponatremia: Secondary | ICD-10-CM

## 2016-10-14 DIAGNOSIS — Z87891 Personal history of nicotine dependence: Secondary | ICD-10-CM

## 2016-10-14 DIAGNOSIS — E119 Type 2 diabetes mellitus without complications: Secondary | ICD-10-CM | POA: Diagnosis present

## 2016-10-14 DIAGNOSIS — I959 Hypotension, unspecified: Secondary | ICD-10-CM | POA: Diagnosis present

## 2016-10-14 DIAGNOSIS — E039 Hypothyroidism, unspecified: Secondary | ICD-10-CM | POA: Diagnosis present

## 2016-10-14 DIAGNOSIS — Z6841 Body Mass Index (BMI) 40.0 and over, adult: Secondary | ICD-10-CM

## 2016-10-14 DIAGNOSIS — F418 Other specified anxiety disorders: Secondary | ICD-10-CM | POA: Diagnosis present

## 2016-10-14 DIAGNOSIS — G4733 Obstructive sleep apnea (adult) (pediatric): Secondary | ICD-10-CM | POA: Diagnosis present

## 2016-10-14 DIAGNOSIS — Z8042 Family history of malignant neoplasm of prostate: Secondary | ICD-10-CM

## 2016-10-14 DIAGNOSIS — Z811 Family history of alcohol abuse and dependence: Secondary | ICD-10-CM

## 2016-10-14 DIAGNOSIS — Z7984 Long term (current) use of oral hypoglycemic drugs: Secondary | ICD-10-CM

## 2016-10-14 DIAGNOSIS — Z8249 Family history of ischemic heart disease and other diseases of the circulatory system: Secondary | ICD-10-CM

## 2016-10-14 DIAGNOSIS — J449 Chronic obstructive pulmonary disease, unspecified: Secondary | ICD-10-CM | POA: Diagnosis present

## 2016-10-14 DIAGNOSIS — T380X5A Adverse effect of glucocorticoids and synthetic analogues, initial encounter: Secondary | ICD-10-CM | POA: Diagnosis present

## 2016-10-14 DIAGNOSIS — Z8261 Family history of arthritis: Secondary | ICD-10-CM

## 2016-10-14 HISTORY — DX: Type 2 diabetes mellitus without complications: E11.9

## 2016-10-14 LAB — CBC WITH DIFFERENTIAL/PLATELET
BASOS ABS: 0 10*3/uL (ref 0.0–0.1)
BASOS PCT: 0.9 % (ref 0.0–3.0)
BASOS PCT: 0 %
Basophils Absolute: 0.1 10*3/uL (ref 0.0–0.1)
EOS ABS: 0.2 10*3/uL (ref 0.0–0.7)
EOS ABS: 0.1 10*3/uL (ref 0.0–0.7)
EOS PCT: 1 %
Eosinophils Relative: 1.5 % (ref 0.0–5.0)
HCT: 44.4 % (ref 36.0–46.0)
HCT: 42.9 % (ref 36.0–46.0)
HEMOGLOBIN: 14.8 g/dL (ref 12.0–15.0)
Hemoglobin: 14.8 g/dL (ref 12.0–15.0)
LYMPHS ABS: 2.7 10*3/uL (ref 0.7–4.0)
Lymphocytes Relative: 18.2 % (ref 12.0–46.0)
Lymphocytes Relative: 12 %
Lymphs Abs: 1.7 10*3/uL (ref 0.7–4.0)
MCH: 31.8 pg (ref 26.0–34.0)
MCHC: 33.4 g/dL (ref 30.0–36.0)
MCHC: 34.5 g/dL (ref 30.0–36.0)
MCV: 94.2 fl (ref 78.0–100.0)
MCV: 92.3 fL (ref 78.0–100.0)
MONO ABS: 1.2 10*3/uL — AB (ref 0.1–1.0)
Monocytes Absolute: 0.9 10*3/uL (ref 0.1–1.0)
Monocytes Relative: 8.2 % (ref 3.0–12.0)
Monocytes Relative: 6 %
NEUTROS PCT: 71.2 % (ref 43.0–77.0)
Neutro Abs: 10.6 10*3/uL — ABNORMAL HIGH (ref 1.4–7.7)
Neutro Abs: 12 10*3/uL — ABNORMAL HIGH (ref 1.7–7.7)
Neutrophils Relative %: 81 %
PLATELETS: 211 10*3/uL (ref 150–400)
Platelets: 218 10*3/uL (ref 150.0–400.0)
RBC: 4.72 Mil/uL (ref 3.87–5.11)
RBC: 4.65 MIL/uL (ref 3.87–5.11)
RDW: 12.7 % (ref 11.5–15.5)
RDW: 12.2 % (ref 11.5–15.5)
WBC: 14.8 10*3/uL — AB (ref 4.0–10.5)
WBC: 14.8 10*3/uL — AB (ref 4.0–10.5)

## 2016-10-14 LAB — URINALYSIS, ROUTINE W REFLEX MICROSCOPIC
Bilirubin Urine: NEGATIVE
Glucose, UA: NEGATIVE mg/dL
Ketones, ur: NEGATIVE mg/dL
NITRITE: NEGATIVE
PH: 5 (ref 5.0–8.0)
Protein, ur: NEGATIVE mg/dL
SPECIFIC GRAVITY, URINE: 1.009 (ref 1.005–1.030)

## 2016-10-14 LAB — BASIC METABOLIC PANEL
ANION GAP: 12 (ref 5–15)
BUN: 62 mg/dL — ABNORMAL HIGH (ref 6–23)
BUN: 59 mg/dL — ABNORMAL HIGH (ref 6–20)
CALCIUM: 9.3 mg/dL (ref 8.9–10.3)
CO2: 32 mEq/L (ref 19–32)
CO2: 28 mmol/L (ref 22–32)
Calcium: 9.3 mg/dL (ref 8.4–10.5)
Chloride: 84 mEq/L — ABNORMAL LOW (ref 96–112)
Chloride: 86 mmol/L — ABNORMAL LOW (ref 101–111)
Creatinine, Ser: 1.49 mg/dL — ABNORMAL HIGH (ref 0.40–1.20)
Creatinine, Ser: 1.17 mg/dL — ABNORMAL HIGH (ref 0.44–1.00)
GFR, EST NON AFRICAN AMERICAN: 53 mL/min — AB (ref >60)
GFR: 39.24 mL/min — ABNORMAL LOW (ref >60.00)
GLUCOSE: 190 mg/dL — AB (ref 70–99)
GLUCOSE: 160 mg/dL — AB (ref 65–99)
POTASSIUM: 4 meq/L (ref 3.5–5.1)
POTASSIUM: 4.3 mmol/L (ref 3.5–5.1)
SODIUM: 126 meq/L — AB (ref 135–145)
Sodium: 126 mmol/L — ABNORMAL LOW (ref 135–145)

## 2016-10-14 LAB — MAGNESIUM: MAGNESIUM: 1.7 mg/dL (ref 1.7–2.4)

## 2016-10-14 LAB — GLUCOSE, CAPILLARY: GLUCOSE-CAPILLARY: 132 mg/dL — AB (ref 65–99)

## 2016-10-14 MED ORDER — LEVOTHYROXINE SODIUM 112 MCG PO TABS
224.0000 ug | ORAL_TABLET | Freq: Every day | ORAL | Status: DC
  Administered 2016-10-15 – 2016-10-16 (×2): 224 ug via ORAL
  Filled 2016-10-14 (×2): qty 2

## 2016-10-14 MED ORDER — MONTELUKAST SODIUM 10 MG PO TABS
10.0000 mg | ORAL_TABLET | Freq: Every day | ORAL | Status: DC
  Administered 2016-10-15 (×2): 10 mg via ORAL
  Filled 2016-10-14 (×3): qty 1

## 2016-10-14 MED ORDER — GARLIC 400 MG PO TABS: 1.0000 | ORAL_TABLET | Freq: Every day | ORAL | Status: DC

## 2016-10-14 MED ORDER — INSULIN ASPART 100 UNIT/ML ~~LOC~~ SOLN
0.0000 [IU] | Freq: Three times a day (TID) | SUBCUTANEOUS | Status: DC
  Administered 2016-10-15: 3 [IU] via SUBCUTANEOUS
  Administered 2016-10-15: 2 [IU] via SUBCUTANEOUS
  Administered 2016-10-16 (×2): 1 [IU] via SUBCUTANEOUS

## 2016-10-14 MED ORDER — SODIUM CHLORIDE 0.9% FLUSH
3.0000 mL | Freq: Two times a day (BID) | INTRAVENOUS | Status: DC
  Administered 2016-10-15 (×2): 3 mL via INTRAVENOUS

## 2016-10-14 MED ORDER — ASPIRIN EC 81 MG PO TBEC
81.0000 mg | DELAYED_RELEASE_TABLET | Freq: Every day | ORAL | Status: DC
  Administered 2016-10-15 – 2016-10-16 (×2): 81 mg via ORAL
  Filled 2016-10-14 (×2): qty 1

## 2016-10-14 MED ORDER — DM-GUAIFENESIN ER 30-600 MG PO TB12: 1.0000 | ORAL_TABLET | Freq: Two times a day (BID) | ORAL | Status: DC | PRN

## 2016-10-14 MED ORDER — FLUTICASONE PROPIONATE HFA 220 MCG/ACT IN AERO: 2.0000 | INHALATION_SPRAY | Freq: Two times a day (BID) | RESPIRATORY_TRACT | Status: DC

## 2016-10-14 MED ORDER — FLUOXETINE HCL 20 MG PO CAPS
20.0000 mg | ORAL_CAPSULE | Freq: Every day | ORAL | Status: DC
  Administered 2016-10-15 – 2016-10-16 (×2): 20 mg via ORAL
  Filled 2016-10-14 (×2): qty 1

## 2016-10-14 MED ORDER — ACETAMINOPHEN 500 MG PO TABS
1000.0000 mg | ORAL_TABLET | Freq: Four times a day (QID) | ORAL | Status: DC | PRN
  Administered 2016-10-16: 1000 mg via ORAL
  Filled 2016-10-14: qty 2

## 2016-10-14 MED ORDER — DIAZEPAM 5 MG PO TABS
5.0000 mg | ORAL_TABLET | Freq: Three times a day (TID) | ORAL | Status: DC | PRN
  Administered 2016-10-15: 5 mg via ORAL
  Filled 2016-10-14: qty 1

## 2016-10-14 MED ORDER — HYDRALAZINE HCL 20 MG/ML IJ SOLN: 5.0000 mg | INTRAMUSCULAR | Status: DC | PRN

## 2016-10-14 MED ORDER — ENOXAPARIN SODIUM 80 MG/0.8ML ~~LOC~~ SOLN
70.0000 mg | Freq: Every day | SUBCUTANEOUS | Status: DC
  Administered 2016-10-15 – 2016-10-16 (×2): 70 mg via SUBCUTANEOUS
  Filled 2016-10-14 (×2): qty 0.8

## 2016-10-14 MED ORDER — SODIUM CHLORIDE 0.9 % IV SOLN
INTRAVENOUS | Status: DC
  Administered 2016-10-15 – 2016-10-16 (×3): via INTRAVENOUS

## 2016-10-14 MED ORDER — PRIMIDONE 50 MG PO TABS
75.0000 mg | ORAL_TABLET | Freq: Every day | ORAL | Status: DC
  Administered 2016-10-15 (×2): 75 mg via ORAL
  Filled 2016-10-14 (×2): qty 1.5

## 2016-10-14 MED ORDER — SIMVASTATIN 40 MG PO TABS
40.0000 mg | ORAL_TABLET | Freq: Every day | ORAL | Status: DC
  Administered 2016-10-15 (×2): 40 mg via ORAL
  Filled 2016-10-14 (×2): qty 1

## 2016-10-14 MED ORDER — INSULIN ASPART 100 UNIT/ML ~~LOC~~ SOLN: 0.0000 [IU] | Freq: Every day | SUBCUTANEOUS | Status: DC

## 2016-10-14 MED ORDER — IRBESARTAN 300 MG PO TABS
300.0000 mg | ORAL_TABLET | Freq: Every day | ORAL | Status: DC
  Administered 2016-10-15: 300 mg via ORAL
  Filled 2016-10-14: qty 1
  Filled 2016-10-14: qty 2

## 2016-10-14 MED ORDER — ZOLPIDEM TARTRATE 5 MG PO TABS: 5.0000 mg | ORAL_TABLET | Freq: Every evening | ORAL | Status: DC | PRN

## 2016-10-14 MED ORDER — BUDESONIDE 0.5 MG/2ML IN SUSP
1.0000 mg | Freq: Two times a day (BID) | RESPIRATORY_TRACT | Status: DC
  Administered 2016-10-15 – 2016-10-16 (×3): 1 mg via RESPIRATORY_TRACT
  Filled 2016-10-14 (×3): qty 4

## 2016-10-14 MED ORDER — BISOPROLOL FUMARATE 5 MG PO TABS
5.0000 mg | ORAL_TABLET | Freq: Every day | ORAL | Status: DC
  Administered 2016-10-15 – 2016-10-16 (×2): 5 mg via ORAL
  Filled 2016-10-14 (×2): qty 1

## 2016-10-14 MED ORDER — PREDNISONE 20 MG PO TABS
10.0000 mg | ORAL_TABLET | Freq: Every day | ORAL | Status: AC
  Administered 2016-10-15: 10 mg via ORAL
  Filled 2016-10-14: qty 1

## 2016-10-14 MED ORDER — ALBUTEROL SULFATE (2.5 MG/3ML) 0.083% IN NEBU: 2.5000 mg | INHALATION_SOLUTION | RESPIRATORY_TRACT | Status: DC | PRN

## 2016-10-14 MED ORDER — ENOXAPARIN SODIUM 40 MG/0.4ML ~~LOC~~ SOLN: 40.0000 mg | SUBCUTANEOUS | Status: DC

## 2016-10-14 MED ORDER — CYCLOBENZAPRINE HCL 10 MG PO TABS: 5.0000 mg | ORAL_TABLET | Freq: Three times a day (TID) | ORAL | Status: DC | PRN

## 2016-10-14 MED ORDER — IRBESARTAN 300 MG PO TABS: 300.0000 mg | ORAL_TABLET | Freq: Every day | ORAL | 2 refills | Status: DC

## 2016-10-14 MED ORDER — ONDANSETRON 4 MG PO TBDP
4.0000 mg | ORAL_TABLET | Freq: Three times a day (TID) | ORAL | Status: DC | PRN
Start: 2016-10-14 — End: 2016-10-16

## 2016-10-14 NOTE — H&P (Addendum)
History and Physical    Autumn Massey EQA:[id number] DOB: Jul 24, 1965 DOA: 10/14/2016  Referring MD/NP/PA:   PCP: [name], MD   Patient coming from:  The patient is coming from home.  At baseline, pt is independent for most of ADL.   Chief Complaint: Abnormal lab with hyponatremia and elevated BUN and Cre  HPI: Autumn Massey is a 51 y.o. female with medical history significant of hypertension, hyperlipidemia, diabetes mellitus, COPD, asthma, on 2 L oxygen at night, hypothyroidism, depression, stroke, OSA not on CPAP, chronic venous insufficiency change, polycythemia, obesity, poor hearing, dCHF, who presents with abnormal lab with hyponatremia and elevated BUN and Cre.  Pt states that she was admitted from 3/31-4/4 for  COPD/PNA.  She followed up with her pcp on 4/12 and had labs drawn then.  Her sodium was low and her BUN/Cr were elevated.  Her doctor told her to come back today for repeat labs today. She was found to have worsening renal function and hyponatremia, therefore was sent to ED by PCP for further evaluation and treatment. Patient reports muscle cramps. She states that she has chronic shortness breath due to COPD and asthma, which is at her baseline. She has nausea, no vomiting, diarrhea or abdominal pain. Denies any chest pain, fever or chills. No symptoms of UTI or unilateral weakness..    ED Course: pt was found to have Sodium 126, creatinine 1.49, BUN 59, WBC 15.8, urinalysis with trace amount of leukocyte, temperature normal, no tachycardia, oxygen saturation 97% on room air. Patient is placed on telemetry bed for observation.   Review of Systems:   General: no fevers, chills, no changes in body weight, has poor appetite, has fatigue HEENT: no blurry vision, hearing changes or sore throat Respiratory: no dyspnea, coughing, wheezing CV: no chest pain, no palpitations GI: has nausea, no vomiting, abdominal pain, diarrhea, constipation GU: no dysuria, burning on urination,  increased urinary frequency, hematuria  Ext: has mild leg edema. Has muscle cramps  Neuro: no unilateral weakness, numbness, or tingling, no vision change or hearing loss Skin: no rash, no skin tear. MSK: No muscle spasm, no deformity, no limitation of range of movement in spin Heme: No easy bruising.  Travel history: No recent long distant travel.  Allergy: No Known Allergies  Past Medical History:  Diagnosis Date  . ANXIETY DEPRESSION 01/29/2009  . DEPRESSION 11/19/2007  . FREQUENCY, URINARY 10/17/2009  . HYPERLIPIDEMIA 11/19/2007  . HYPERTENSION 11/19/2007  . HYPOTHYROIDISM 11/19/2007  . Morbid obesity ([place])   . PNEUMONIA 08/06/2010  . POLYCYTHEMIA 01/29/2009  . Preventative health care 04/06/2011  . Sleep apnea    mild sleep apnea, on o2 at 2l at nighttime  . SLEEP RELATED HYPOVENTILATION/HYPOXEMIA CCE 02/08/2009  . Venous insufficiency 07/19/2012    Past Surgical History:  Procedure Laterality Date  . TONSILLECTOMY    . ULNAR TUNNEL RELEASE Left 02/22/2013   Procedure: LEFT ULNAR NERVE DECOMPRESSION ;  Surgeon: Tennis Must, MD;  Location: [facility];  Service: Orthopedics;  Laterality: Left;    Social History:  reports that she quit smoking about 16 months ago. Her smoking use included Cigarettes. She has a 11.00 pack-year smoking history. She has never used smokeless tobacco. She reports that she drinks alcohol. She reports that she does not use drugs.  Family History:  Family History  Problem Relation Age of Onset  . Cancer Mother     melanoma   . Cancer Maternal Grandfather     prostate cancer  .  Alcohol abuse Other   . Arthritis Other   . Hypertension Other      Prior to Admission medications   Medication Sig Start Date End Date Taking? Authorizing Provider  acetaminophen (TYLENOL) 500 MG tablet Take 1,000 mg by mouth every 6 (six) hours as needed for mild pain, moderate pain, fever or headache.    Yes Historical Provider, MD  albuterol (PROVENTIL) (2.5  MG/3ML) 0.083% nebulizer solution Take 3 mLs (2.5 mg total) by nebulization every 6 (six) hours as needed for wheezing or shortness of breath. 10/01/16  Yes Doreatha Lew, MD  aspirin EC 81 MG tablet Take 81 mg by mouth daily.   Yes Historical Provider, MD  bisoprolol (ZEBETA) 5 MG tablet Take 1 tablet (5 mg total) by mouth daily. 10/05/15  Yes Tanda Rockers, MD  cyclobenzaprine (FLEXERIL) 5 MG tablet Take 1 tablet (5 mg total) by mouth 3 (three) times daily as needed for muscle spasms. 10/09/16  Yes Biagio Borg, MD  dextromethorphan-guaiFENesin [facility] DM) 30-600 MG 12hr tablet Take 1 tablet by mouth 2 (two) times daily as needed for cough.   Yes Historical Provider, MD  FLUoxetine (PROZAC) 20 MG capsule TAKE ONE CAPSULE BY MOUTH DAILY. [place] appointment. FOR FUTURE REFILLS. 04/03/16  Yes Biagio Borg, MD  fluticasone (FLOVENT HFA) 220 MCG/ACT inhaler Inhale 2 puffs into the lungs 2 (two) times daily. 10/01/16  Yes Doreatha Lew, MD  furosemide (LASIX) 40 MG tablet TAKE ONE TABLET BY MOUTH EVERY DAY. Must keep appointment FOR future refills 06/03/16  Yes Biagio Borg, MD  GARLIC PO Take 1 tablet by mouth daily.   Yes Historical Provider, MD  glipiZIDE (GLUCOTROL) 10 MG tablet Take 1 tablet (10 mg total) by mouth 2 (two) times daily before a meal. 10/01/16  Yes Doreatha Lew, MD  hydrochlorothiazide (HYDRODIURIL) 25 MG tablet TAKE ONE TABLET BY MOUTH EVERY DAY 06/03/16  Yes Biagio Borg, MD  irbesartan (AVAPRO) 300 MG tablet Take 1 tablet (300 mg total) by mouth at bedtime. 10/14/16  Yes Biagio Borg, MD  levothyroxine (SYNTHROID, LEVOTHROID) 112 MCG tablet Take 2 tablets (224 mcg total) by mouth daily before breakfast. 10/02/16  Yes Doreatha Lew, MD  metFORMIN (GLUCOPHAGE-XR) 500 MG 24 hr tablet Take 1,000 mg by mouth daily with breakfast.    Yes Historical Provider, MD  montelukast (SINGULAIR) 10 MG tablet Take 1 tablet (10 mg total) by mouth at bedtime. 10/01/16  Yes Doreatha Lew,  MD  ondansetron (ZOFRAN ODT) 4 MG disintegrating tablet Take 1 tablet (4 mg total) by mouth every 8 (eight) hours as needed for nausea or vomiting. 10/09/16  Yes Biagio Borg, MD  predniSONE (DELTASONE) 10 MG tablet Take 4 tablets for 3 days; Take 3 tablets for 4 days; Take 2 tablets for 3 days; Take 1 tablet for 4 days 10/01/16  Yes Doreatha Lew, MD  primidone (MYSOLINE) 50 MG tablet TAKE 2 AND 1/2 TABLETS BY MOUTH AT BEDTIME. Please schedule follow up visit 06/03/16  Yes Adam Telford Nab, DO  simvastatin (ZOCOR) 40 MG tablet Take 1 tablet (40 mg total) by mouth at bedtime. Keep July appt for future refills 12/07/15  Yes Biagio Borg, MD  spironolactone (ALDACTONE) 25 MG tablet TAKE ONE TABLET BY MOUTH TWICE DAILY. Patient taking differently: Take 25 mg by mouth 2 (two) times daily.  09/16/16  Yes Philemon Kingdom, MD    Physical Exam: Vitals:   10/15/16 0004 10/15/16  0500 10/15/16 0509 10/15/16 0652  BP: (!) 107/54  (!) 86/44 (!) 91/49  Pulse: 75  66   Resp: 18  18   Temp: 97.8 F (36.6 C)  97.8 F (36.6 C)   TempSrc: Oral  Oral   SpO2: 97%  95%   Weight:  (!) 147.6 kg (325 lb 8 oz)     General: Not in acute distress HEENT:       Eyes: PERRL, EOMI, no scleral icterus.       ENT: No discharge from the ears and nose, no pharynx injection, no tonsillar enlargement.        Neck: Difficult to assess JVD due to morbid obesity.  no bruit, no mass felt. Heme: No neck lymph node enlargement. Cardiac: S1/S2, RRR, No murmurs, No gallops or rubs. Respiratory: No rales, wheezing, rhonchi or rubs. GI: Soft, nondistended, nontender, no rebound pain, no organomegaly, BS present. GU: No hematuria Ext: has trace pitting leg edema bilaterally. Has chronic venous insufficiency changes. 2+DP/PT pulse bilaterally. Musculoskeletal: No joint deformities, No joint redness or warmth, no limitation of ROM in spin. Skin: No rashes.  Neuro: Alert, oriented X3, cranial nerves II-XII grossly intact, moves all  extremities normally. Psych: Patient is not psychotic, no suicidal or hemocidal ideation.  Labs on Admission: I have personally reviewed following labs and imaging studies  CBC:  Recent Labs Lab 10/09/16 1609 10/14/16 1420 10/14/16 2150  WBC 21.1 Repeated and verified X2.* 14.8* 14.8*  NEUTROABS 17.4* 10.6* 12.0*  HGB 16.4* 14.8 14.8  HCT 50.3* 44.4 42.9  MCV 94.5 94.2 92.3  PLT 259.0 218.0 419   Basic Metabolic Panel:  Recent Labs Lab 10/09/16 1609 10/14/16 1420 10/14/16 2150 10/14/16 2231  NA 126* 126* 126*  --   K 4.2 4.0 4.3  --   CL 84* 84* 86*  --   CO2 34* 32 28  --   GLUCOSE 222* 190* 160*  --   BUN 54* 62* 59*  --   CREATININE 1.08 1.49* 1.17*  --   CALCIUM 10.2 9.3 9.3  --   MG 1.9  --   --  1.7   GFR: Estimated Creatinine Clearance: 80.9 mL/min (A) (by C-G formula based on SCr of 1.17 mg/dL (H)). Liver Function Tests: No results for input(s): AST, ALT, ALKPHOS, BILITOT, PROT, ALBUMIN in the last 168 hours. No results for input(s): LIPASE, AMYLASE in the last 168 hours. No results for input(s): AMMONIA in the last 168 hours. Coagulation Profile: No results for input(s): INR, PROTIME in the last 168 hours. Cardiac Enzymes: No results for input(s): CKTOTAL, CKMB, CKMBINDEX, TROPONINI in the last 168 hours. BNP (last 3 results) No results for input(s): PROBNP in the last 8760 hours. HbA1C: No results for input(s): HGBA1C in the last 72 hours. CBG:  Recent Labs Lab 10/15/16 0001  GLUCAP 132*   Lipid Profile: No results for input(s): CHOL, HDL, LDLCALC, TRIG, CHOLHDL, LDLDIRECT in the last 72 hours. Thyroid Function Tests: No results for input(s): TSH, T4TOTAL, FREET4, T3FREE, THYROIDAB in the last 72 hours. Anemia Panel: No results for input(s): VITAMINB12, FOLATE, FERRITIN, TIBC, IRON, RETICCTPCT in the last 72 hours. Urine analysis:    Component Value Date/Time   COLORURINE STRAW (A) 10/14/2016 2202   APPEARANCEUR CLEAR 10/14/2016 2202    LABSPEC 1.009 10/14/2016 2202   PHURINE 5.0 10/14/2016 2202   GLUCOSEU NEGATIVE 10/14/2016 2202   GLUCOSEU NEGATIVE 11/22/2014 1403   HGBUR SMALL (A) 10/14/2016 2202   BILIRUBINUR NEGATIVE 10/14/2016  [place] 10/14/2016 2202   PROTEINUR NEGATIVE 10/14/2016 2202   UROBILINOGEN 0.2 11/22/2014 1403   NITRITE NEGATIVE 10/14/2016 2202   LEUKOCYTESUR TRACE (A) 10/14/2016 2202   Sepsis Labs: @LABRCNTIP (procalcitonin:4,lacticidven:4) )No results found for this or any previous visit (from the past 240 hour(s)).   Radiological Exams on Admission: No results found.   EKG: Independently reviewed.  QTC 458, early R-wave progression.  Assessment/Plan Principal Problem:   AKI (acute kidney injury) ([place]) Active Problems:   Hypothyroidism   HLD (hyperlipidemia)   ANXIETY DEPRESSION   Essential hypertension   Severe obesity (BMI >= 40) (HCC)   History of CVA (cerebrovascular accident)   COPD GOLD 0    Chronic diastolic congestive heart failure (HCC)   Hyponatremia   Leukocytosis   AKI: Creatinine 1.49, BUN 59. Likely due to prerenal secondary to dehydration and continuation of ARB, diruetics - IVF: ns 75 cc/h - Check  FeUrea - Follow up renal function by BMP - Hold diuretics, Lasix, HCTZ, spironolactone  Hyponatremia: Most likely due to use of diuretics. Sodium 126. Mental status normal. - Will check urine sodium, urine osmolality, serum osmolality. - check TSH - IVF: NS 75 mL/h - check BMP q6h -Hold diuretics as above    HLD: -zocor  Depression and anxiety: Stable, no suicidal or homicidal ideations. -Continue home medications: Prozac, primidone  HTN: -continue irbesartan and Zebeta -IV hydralazine when necessary  History of CVA (cerebrovascular accident): -continue ASA and zocor  COPD and asthma: Stable. Patient is taking tapering dose of prednisone. Tomorrow will be the last dose. -Continue Singulair, Pulmicort nebulizer, when necessary albuterol  nebulizer  Chronic diastolic congestive heart failure ([place]): 2-D echo on 09/29/16 showed EF of 60-65%. Patient has trace amount of leg edema, clinically CHF is compensated. -Hold diuretics due to acute renal injury and hyponatremia -Check  BNP  Hypothyroidism: Last TSH was 9.96 and T4 0.77 -Continue home Synthroid -Check TSH  Leukocytosis: Likely due to demargination secondary to steroid use. Patient does not have fever. Urinalysis has trace amount of leukocytes, but patient does not have symptoms of UTI. -Follow-up by CBC -Follow up urine culture   DVT ppx: SQ Lovenox Code Status: Full code Family Communication:  Yes, patient's Mother and sister   at bed side Disposition Plan:  Anticipate discharge back to previous home environment Consults called:  none Admission status: Obs / tele    Date of Service 10/15/2016    Ivor Costa [facility] Pager [phone]  If 7PM-7AM, please contact night-coverage [email or web address] Password [facility] 10/15/2016, 6:56 AM

## 2016-10-14 NOTE — Progress Notes (Signed)
Rx Brief note:  Lovenox  Wt=146 kg , CrCl~80 ml/min, BMI= 58  Rx adjusted Lovenox to 70 mg daily (~0.5 mg/kg) for DVT prophylaxis.   Thanks Dorrene German 10/15/2016 12:01 AM

## 2016-10-14 NOTE — ED Triage Notes (Signed)
Pt states that she takes a lot of diuretics and has been sent in by Dr. Jenny Reichmann because her sodium remains at 126 and her BUN and creatinine are elevated despite repeating labs in office. Alert and oriented.

## 2016-10-14 NOTE — ED Provider Notes (Signed)
[place] DEPT Provider Note   CSN: [id number] Arrival date & time: 10/14/16  2040     History   Chief Complaint Chief Complaint  Patient presents with  . Abnormal Lab    HPI Autumn Massey is a 51 y.o. female.  Pt presents to the ED today because her doctor told her to come to the ED because of low sodium and elevated BUN/Cr.  The pt was admitted from 3/31-4/4 for  COPD/PNA.  She followed up with her pcp on 4/12 and had labs drawn then.  Her sodium was low and her BUN/Cr were elevated.  Her doctor told her to come back today for repeat labs today.  Labs today were worse with worsening GFR.  Pt has been complaining of muscle cramps, but otherwise has been feeling better.      Past Medical History:  Diagnosis Date  . ANXIETY DEPRESSION 01/29/2009  . DEPRESSION 11/19/2007  . FREQUENCY, URINARY 10/17/2009  . HYPERLIPIDEMIA 11/19/2007  . HYPERTENSION 11/19/2007  . HYPOTHYROIDISM 11/19/2007  . Morbid obesity ([place])   . PNEUMONIA 08/06/2010  . POLYCYTHEMIA 01/29/2009  . Preventative health care 04/06/2011  . Sleep apnea    mild sleep apnea, on o2 at 2l at nighttime  . SLEEP RELATED HYPOVENTILATION/HYPOXEMIA CCE 02/08/2009  . Venous insufficiency 07/19/2012    Patient Active Problem List   Diagnosis Date Noted  . Nausea 10/09/2016  . Leg cramps 10/09/2016  . Community acquired pneumonia 09/28/2016  . Chronic diastolic congestive heart failure ([place]) 09/28/2016  . Asthma exacerbation 12/20/2015  . Type 2 diabetes mellitus with hyperglycemia, without long-term current use of insulin ([place]) 11/19/2015  . Dyspnea 10/09/2015  . Chronic respiratory failure with hypoxia and hypercapnia ([place]) 07/19/2015  . COPD GOLD 0  07/18/2015  . Vaginitis 05/31/2015  . Thoracic outlet syndrome 01/29/2015  . Ulnar neuropathy at elbow of right upper extremity 12/19/2014  . Benign essential tremor 10/13/2014  . Movement disorder 08/30/2014  . Cough 08/30/2014  . Rash and nonspecific skin eruption  03/07/2014  . Skin lesion of back 03/07/2014  . PCOS (polycystic ovarian syndrome) 10/31/2013  . History of CVA (cerebrovascular accident) 08/11/2013  . Asthmatic bronchitis 06/10/2013  . Acute meniscal tear of right knee 06/06/2013  . Sprain of MCL (medial collateral ligament) of knee 05/03/2013  . Left arm pain 02/09/2013  . Hypoxemia 07/19/2012  . Venous insufficiency 07/19/2012  . Severe obesity (BMI >= 40) ([place]) 07/19/2012  . Former smoker 04/11/2011  . Encounter for preventative adult health care exam with abnormal findings 04/06/2011  . SLEEP RELATED HYPOVENTILATION/HYPOXEMIA CCE 02/08/2009  . POLYCYTHEMIA 01/29/2009  . ANXIETY DEPRESSION 01/29/2009  . Hypothyroidism 11/19/2007  . HYPERLIPIDEMIA 11/19/2007  . DEPRESSION 11/19/2007  . Essential hypertension 11/19/2007    Past Surgical History:  Procedure Laterality Date  . TONSILLECTOMY    . ULNAR TUNNEL RELEASE Left 02/22/2013   Procedure: LEFT ULNAR NERVE DECOMPRESSION ;  Surgeon: Tennis Must, MD;  Location: [facility];  Service: Orthopedics;  Laterality: Left;    OB History    No data available       Home Medications    Prior to Admission medications   Medication Sig Start Date End Date Taking? Authorizing Provider  acetaminophen (TYLENOL) 500 MG tablet Take 1,000 mg by mouth every 6 (six) hours as needed for mild pain, moderate pain, fever or headache.    Yes Historical Provider, MD  albuterol (PROVENTIL) (2.5 MG/3ML) 0.083% nebulizer solution Take 3 mLs (2.5 mg total)  by nebulization every 6 (six) hours as needed for wheezing or shortness of breath. 10/01/16  Yes Doreatha Lew, MD  aspirin EC 81 MG tablet Take 81 mg by mouth daily.   Yes Historical Provider, MD  bisoprolol (ZEBETA) 5 MG tablet Take 1 tablet (5 mg total) by mouth daily. 10/05/15  Yes Tanda Rockers, MD  cyclobenzaprine (FLEXERIL) 5 MG tablet Take 1 tablet (5 mg total) by mouth 3 (three) times daily as needed for muscle spasms.  10/09/16  Yes Biagio Borg, MD  dextromethorphan-guaiFENesin [facility] DM) 30-600 MG 12hr tablet Take 1 tablet by mouth 2 (two) times daily as needed for cough.   Yes Historical Provider, MD  FLUoxetine (PROZAC) 20 MG capsule TAKE ONE CAPSULE BY MOUTH DAILY. [place] appointment. FOR FUTURE REFILLS. 04/03/16  Yes Biagio Borg, MD  fluticasone (FLOVENT HFA) 220 MCG/ACT inhaler Inhale 2 puffs into the lungs 2 (two) times daily. 10/01/16  Yes Doreatha Lew, MD  furosemide (LASIX) 40 MG tablet TAKE ONE TABLET BY MOUTH EVERY DAY. Must keep appointment FOR future refills 06/03/16  Yes Biagio Borg, MD  GARLIC PO Take 1 tablet by mouth daily.   Yes Historical Provider, MD  glipiZIDE (GLUCOTROL) 10 MG tablet Take 1 tablet (10 mg total) by mouth 2 (two) times daily before a meal. 10/01/16  Yes Doreatha Lew, MD  hydrochlorothiazide (HYDRODIURIL) 25 MG tablet TAKE ONE TABLET BY MOUTH EVERY DAY 06/03/16  Yes Biagio Borg, MD  irbesartan (AVAPRO) 300 MG tablet Take 1 tablet (300 mg total) by mouth at bedtime. 10/14/16  Yes Biagio Borg, MD  levothyroxine (SYNTHROID, LEVOTHROID) 112 MCG tablet Take 2 tablets (224 mcg total) by mouth daily before breakfast. 10/02/16  Yes Doreatha Lew, MD  metFORMIN (GLUCOPHAGE-XR) 500 MG 24 hr tablet Take 1,000 mg by mouth daily with breakfast.    Yes Historical Provider, MD  montelukast (SINGULAIR) 10 MG tablet Take 1 tablet (10 mg total) by mouth at bedtime. 10/01/16  Yes Doreatha Lew, MD  ondansetron (ZOFRAN ODT) 4 MG disintegrating tablet Take 1 tablet (4 mg total) by mouth every 8 (eight) hours as needed for nausea or vomiting. 10/09/16  Yes Biagio Borg, MD  predniSONE (DELTASONE) 10 MG tablet Take 4 tablets for 3 days; Take 3 tablets for 4 days; Take 2 tablets for 3 days; Take 1 tablet for 4 days 10/01/16  Yes Doreatha Lew, MD  primidone (MYSOLINE) 50 MG tablet TAKE 2 AND 1/2 TABLETS BY MOUTH AT BEDTIME. Please schedule follow up visit 06/03/16  Yes Adam Telford Nab,  DO  simvastatin (ZOCOR) 40 MG tablet Take 1 tablet (40 mg total) by mouth at bedtime. Keep July appt for future refills 12/07/15  Yes Biagio Borg, MD  spironolactone (ALDACTONE) 25 MG tablet TAKE ONE TABLET BY MOUTH TWICE DAILY. Patient taking differently: Take 25 mg by mouth 2 (two) times daily.  09/16/16  Yes Philemon Kingdom, MD    Family History Family History  Problem Relation Age of Onset  . Cancer Mother     melanoma   . Cancer Maternal Grandfather     prostate cancer  . Alcohol abuse Other   . Arthritis Other   . Hypertension Other     Social History Social History  Substance Use Topics  . Smoking status: Former Smoker    Packs/day: 0.50    Years: 22.00    Types: Cigarettes    Quit date: 05/22/2015  .  Smokeless tobacco: Never Used  . Alcohol use 0.0 oz/week     Comment: rare     Allergies   Patient has no known allergies.   Review of Systems Review of Systems  Musculoskeletal:       Muscle cramps  All other systems reviewed and are negative.    Physical Exam Updated Vital Signs BP 135/71 (BP Location: Right Arm)   Pulse 81   Temp 97.8 F (36.6 C) (Oral)   Resp 16   SpO2 97%   Physical Exam  Constitutional: She is oriented to person, place, and time. She appears well-developed and well-nourished.  HENT:  Head: Normocephalic and atraumatic.  Right Ear: External ear normal.  Left Ear: External ear normal.  Nose: Nose normal.  Mouth/Throat: Oropharynx is clear and moist.  Eyes: Conjunctivae and EOM are normal. Pupils are equal, round, and reactive to light.  Neck: Normal range of motion. Neck supple.  Cardiovascular: Normal rate, regular rhythm, normal heart sounds and intact distal pulses.   Pulmonary/Chest: Effort normal and breath sounds normal.  Abdominal: Soft. Bowel sounds are normal.  Musculoskeletal: Normal range of motion.  Neurological: She is alert and oriented to person, place, and time.  Skin: Skin is warm. Capillary refill takes less  than 2 seconds.  Psychiatric: She has a normal mood and affect. Her behavior is normal. Judgment and thought content normal.  Nursing note and vitals reviewed.    ED Treatments / Results  Labs (all labs ordered are listed, but only abnormal results are displayed) Labs Reviewed  BASIC METABOLIC PANEL - Abnormal; Notable for the following:       Result Value   Sodium 126 (*)    Chloride 86 (*)    Glucose, Bld 160 (*)    BUN 59 (*)    Creatinine, Ser 1.17 (*)    GFR calc non Af Amer 53 (*)    All other components within normal limits  CBC WITH DIFFERENTIAL/PLATELET - Abnormal; Notable for the following:    WBC 14.8 (*)    Neutro Abs 12.0 (*)    All other components within normal limits  URINALYSIS, ROUTINE W REFLEX MICROSCOPIC - Abnormal; Notable for the following:    Color, Urine STRAW (*)    Hgb urine dipstick SMALL (*)    Leukocytes, UA TRACE (*)    Bacteria, UA RARE (*)    Squamous Epithelial / LPF 0-5 (*)    All other components within normal limits  MAGNESIUM    EKG  EKG Interpretation None       Radiology No results found.  Procedures Procedures (including critical care time)  Medications Ordered in ED Medications - No data to display   Initial Impression / Assessment and Plan / ED Course  I have reviewed the triage vital signs and the nursing notes.  Pertinent labs & imaging results that were available during my care of the patient were reviewed by me and considered in my medical decision making (see chart for details).    PCP has tried to correct abnormalities as an outpatient, but it has not improved.  Pt d/w Dr. Blaine Hamper ([facility]) for admission.   Final Clinical Impressions(s) / ED Diagnoses   Final diagnoses:  Hyponatremia  AKI (acute kidney injury) [facility])    New Prescriptions New Prescriptions   No medications on file     Isla Pence, MD 10/14/16 2312

## 2016-10-15 ENCOUNTER — Encounter (HOSPITAL_COMMUNITY): Payer: Self-pay | Admitting: *Deleted

## 2016-10-15 DIAGNOSIS — E039 Hypothyroidism, unspecified: Secondary | ICD-10-CM | POA: Diagnosis present

## 2016-10-15 DIAGNOSIS — F341 Dysthymic disorder: Secondary | ICD-10-CM

## 2016-10-15 DIAGNOSIS — Z808 Family history of malignant neoplasm of other organs or systems: Secondary | ICD-10-CM | POA: Diagnosis not present

## 2016-10-15 DIAGNOSIS — I5032 Chronic diastolic (congestive) heart failure: Secondary | ICD-10-CM | POA: Diagnosis present

## 2016-10-15 DIAGNOSIS — Z8042 Family history of malignant neoplasm of prostate: Secondary | ICD-10-CM | POA: Diagnosis not present

## 2016-10-15 DIAGNOSIS — J440 Chronic obstructive pulmonary disease with acute lower respiratory infection: Secondary | ICD-10-CM | POA: Diagnosis present

## 2016-10-15 DIAGNOSIS — Z8261 Family history of arthritis: Secondary | ICD-10-CM | POA: Diagnosis not present

## 2016-10-15 DIAGNOSIS — E860 Dehydration: Secondary | ICD-10-CM | POA: Diagnosis present

## 2016-10-15 DIAGNOSIS — Z9889 Other specified postprocedural states: Secondary | ICD-10-CM | POA: Diagnosis not present

## 2016-10-15 DIAGNOSIS — F418 Other specified anxiety disorders: Secondary | ICD-10-CM | POA: Diagnosis present

## 2016-10-15 DIAGNOSIS — Z8673 Personal history of transient ischemic attack (TIA), and cerebral infarction without residual deficits: Secondary | ICD-10-CM | POA: Diagnosis not present

## 2016-10-15 DIAGNOSIS — I110 Hypertensive heart disease with heart failure: Secondary | ICD-10-CM | POA: Diagnosis present

## 2016-10-15 DIAGNOSIS — Z7984 Long term (current) use of oral hypoglycemic drugs: Secondary | ICD-10-CM | POA: Diagnosis not present

## 2016-10-15 DIAGNOSIS — T380X5A Adverse effect of glucocorticoids and synthetic analogues, initial encounter: Secondary | ICD-10-CM | POA: Diagnosis present

## 2016-10-15 DIAGNOSIS — Z8249 Family history of ischemic heart disease and other diseases of the circulatory system: Secondary | ICD-10-CM | POA: Diagnosis not present

## 2016-10-15 DIAGNOSIS — N179 Acute kidney failure, unspecified: Secondary | ICD-10-CM | POA: Diagnosis present

## 2016-10-15 DIAGNOSIS — E785 Hyperlipidemia, unspecified: Secondary | ICD-10-CM | POA: Diagnosis present

## 2016-10-15 DIAGNOSIS — D72829 Elevated white blood cell count, unspecified: Secondary | ICD-10-CM | POA: Diagnosis present

## 2016-10-15 DIAGNOSIS — E871 Hypo-osmolality and hyponatremia: Secondary | ICD-10-CM | POA: Diagnosis present

## 2016-10-15 DIAGNOSIS — Z79899 Other long term (current) drug therapy: Secondary | ICD-10-CM | POA: Diagnosis not present

## 2016-10-15 DIAGNOSIS — Z6841 Body Mass Index (BMI) 40.0 and over, adult: Secondary | ICD-10-CM | POA: Diagnosis not present

## 2016-10-15 DIAGNOSIS — Z7951 Long term (current) use of inhaled steroids: Secondary | ICD-10-CM | POA: Diagnosis not present

## 2016-10-15 DIAGNOSIS — Z811 Family history of alcohol abuse and dependence: Secondary | ICD-10-CM | POA: Diagnosis not present

## 2016-10-15 DIAGNOSIS — G4733 Obstructive sleep apnea (adult) (pediatric): Secondary | ICD-10-CM | POA: Diagnosis present

## 2016-10-15 DIAGNOSIS — Z87891 Personal history of nicotine dependence: Secondary | ICD-10-CM | POA: Diagnosis not present

## 2016-10-15 LAB — CBC
HCT: 44.4 % (ref 36.0–46.0)
Hemoglobin: 15 g/dL (ref 12.0–15.0)
MCH: 31.8 pg (ref 26.0–34.0)
MCHC: 33.8 g/dL (ref 30.0–36.0)
MCV: 94.1 fL (ref 78.0–100.0)
PLATELETS: 198 10*3/uL (ref 150–400)
RBC: 4.72 MIL/uL (ref 3.87–5.11)
RDW: 12.4 % (ref 11.5–15.5)
WBC: 13 10*3/uL — ABNORMAL HIGH (ref 4.0–10.5)

## 2016-10-15 LAB — OSMOLALITY, URINE: OSMOLALITY UR: 337 mosm/kg (ref 300–900)

## 2016-10-15 LAB — BASIC METABOLIC PANEL
Anion gap: 13 (ref 5–15)
Anion gap: 13 (ref 5–15)
Anion gap: 11 (ref 5–15)
Anion gap: 8 (ref 5–15)
BUN: 57 mg/dL — AB (ref 6–20)
BUN: 57 mg/dL — AB (ref 6–20)
BUN: 54 mg/dL — ABNORMAL HIGH (ref 6–20)
BUN: 45 mg/dL — ABNORMAL HIGH (ref 6–20)
CALCIUM: 8.8 mg/dL — AB (ref 8.9–10.3)
CALCIUM: 8.9 mg/dL (ref 8.9–10.3)
CALCIUM: 9.1 mg/dL (ref 8.9–10.3)
CALCIUM: 9.4 mg/dL (ref 8.9–10.3)
CHLORIDE: 89 mmol/L — AB (ref 101–111)
CO2: 27 mmol/L (ref 22–32)
CO2: 28 mmol/L (ref 22–32)
CO2: 28 mmol/L (ref 22–32)
CO2: 31 mmol/L (ref 22–32)
CREATININE: 1.32 mg/dL — AB (ref 0.44–1.00)
CREATININE: 1.32 mg/dL — AB (ref 0.44–1.00)
CREATININE: 1.09 mg/dL — AB (ref 0.44–1.00)
CREATININE: 0.98 mg/dL (ref 0.44–1.00)
Chloride: 87 mmol/L — ABNORMAL LOW (ref 101–111)
Chloride: 87 mmol/L — ABNORMAL LOW (ref 101–111)
Chloride: 87 mmol/L — ABNORMAL LOW (ref 101–111)
GFR calc Af Amer: >60 mL/min (ref >60)
GFR calc Af Amer: >60 mL/min (ref >60)
GFR calc non Af Amer: 46 mL/min — ABNORMAL LOW (ref >60)
GFR calc non Af Amer: 46 mL/min — ABNORMAL LOW (ref >60)
GFR calc non Af Amer: 58 mL/min — ABNORMAL LOW (ref >60)
GFR calc non Af Amer: >60 mL/min (ref >60)
GFR, EST AFRICAN AMERICAN: 53 mL/min — AB (ref >60)
GFR, EST AFRICAN AMERICAN: 53 mL/min — AB (ref >60)
GLUCOSE: 105 mg/dL — AB (ref 65–99)
GLUCOSE: 106 mg/dL — AB (ref 65–99)
GLUCOSE: 207 mg/dL — AB (ref 65–99)
GLUCOSE: 187 mg/dL — AB (ref 65–99)
Potassium: 4 mmol/L (ref 3.5–5.1)
Potassium: 4 mmol/L (ref 3.5–5.1)
Potassium: 4.4 mmol/L (ref 3.5–5.1)
Potassium: 4.7 mmol/L (ref 3.5–5.1)
Sodium: 127 mmol/L — ABNORMAL LOW (ref 135–145)
Sodium: 128 mmol/L — ABNORMAL LOW (ref 135–145)
Sodium: 126 mmol/L — ABNORMAL LOW (ref 135–145)
Sodium: 128 mmol/L — ABNORMAL LOW (ref 135–145)

## 2016-10-15 LAB — T4, FREE: Free T4: 1.05 ng/dL (ref 0.61–1.12)

## 2016-10-15 LAB — LIPID PANEL
CHOLESTEROL: 149 mg/dL (ref 0–200)
HDL: 47 mg/dL (ref >40)
LDL Cholesterol: 65 mg/dL (ref 0–99)
Total CHOL/HDL Ratio: 3.2 RATIO
Triglycerides: 187 mg/dL — ABNORMAL HIGH (ref <150)
VLDL: 37 mg/dL (ref 0–40)

## 2016-10-15 LAB — SODIUM, URINE, RANDOM: Sodium, Ur: 40 mmol/L

## 2016-10-15 LAB — GLUCOSE, CAPILLARY
Glucose-Capillary: 103 mg/dL — ABNORMAL HIGH (ref 65–99)
Glucose-Capillary: 198 mg/dL — ABNORMAL HIGH (ref 65–99)
Glucose-Capillary: 215 mg/dL — ABNORMAL HIGH (ref 65–99)
Glucose-Capillary: 169 mg/dL — ABNORMAL HIGH (ref 65–99)

## 2016-10-15 LAB — HCG, QUANTITATIVE, PREGNANCY: hCG, Beta Chain, Quant, S: 1 m[IU]/mL (ref <5)

## 2016-10-15 LAB — TSH: TSH: 14.632 u[IU]/mL — ABNORMAL HIGH (ref 0.350–4.500)

## 2016-10-15 LAB — BRAIN NATRIURETIC PEPTIDE: B Natriuretic Peptide: 25.3 pg/mL (ref 0.0–100.0)

## 2016-10-15 LAB — CREATININE, URINE, RANDOM: CREATININE, URINE: 47.7 mg/dL

## 2016-10-15 LAB — OSMOLALITY: Osmolality: 286 mOsm/kg (ref 275–295)

## 2016-10-15 LAB — PROTEIN, TOTAL: TOTAL PROTEIN: 6.8 g/dL (ref 6.5–8.1)

## 2016-10-15 MED ORDER — SODIUM CHLORIDE 0.9 % IV BOLUS (SEPSIS)
500.0000 mL | Freq: Once | INTRAVENOUS | Status: AC
  Administered 2016-10-15: 500 mL via INTRAVENOUS

## 2016-10-15 MED ORDER — IRBESARTAN 75 MG PO TABS
75.0000 mg | ORAL_TABLET | Freq: Every day | ORAL | Status: DC
  Administered 2016-10-15: 75 mg via ORAL
  Filled 2016-10-15: qty 1

## 2016-10-15 NOTE — Progress Notes (Signed)
PROGRESS NOTE    Autumn Massey  CBJ:[id number] DOB: 12-11-1965 DOA: 10/14/2016 PCP: [name], MD   Brief Narrative: Autumn Massey is a 51 y.o. female with a history of htn, hld, diabetes mellitus, COPD, hypothyroidism   Assessment & Plan:   Principal Problem:   AKI (acute kidney injury) ([place]) Active Problems:   Hypothyroidism   HLD (hyperlipidemia)   ANXIETY DEPRESSION   Essential hypertension   Severe obesity (BMI >= 40) (H[facility])   History of CVA (cerebrovascular accident)   COPD GOLD 0    Chronic diastolic congestive heart failure ([facility])   Hyponatremia   Leukocytosis   Acute kidney injury Likely secondary to a combination of dehydration in setting of ARB and diuretics. Improved.  Hyponatremia Unknown etiology. Possibly medication related. Has not improved. -continue BMPs -AM cortisol -repeat urine sodium  COPD Asthma Stable -continue nebulizer treatments  History of CVA -continue ASA and Zocor  Hypothyroidism -continue Synthroid  Leukocytosis Secondary to steroid use  Hypertension -continue irbesartan (decrease dose) -hold diuretics   DVT prophylaxis: Lovenox Code Status: Full code Family Communication: None at bedside Disposition Plan: Discharge in 1-2 days   Consultants:   None  Procedures:   None  Antimicrobials:  None    Subjective: Patient reports some fatigue. No nausea or vomiting.  Objective: Vitals:   10/15/16 0500 10/15/16 0509 10/15/16 0652 10/15/16 0916  BP:  (!) 86/44 (!) 91/49   Pulse:  66    Resp:  18    Temp:  97.8 F (36.6 C)    TempSrc:  Oral    SpO2:  95%  93%  Weight: (!) 147.6 kg (325 lb 8 oz)       Intake/Output Summary (Last 24 hours) at 10/15/16 1323 Last data filed at 10/15/16 0942  Gross per 24 hour  Intake             1430 ml  Output                0 ml  Net             1430 ml   Filed Weights   10/15/16 0500  Weight: (!) 147.6 kg (325 lb 8 oz)    Examination:  General exam: Appears  calm and comfortable. Respiratory system: Clear to auscultation. Respiratory effort normal. Cardiovascular system: S1 & S2 heard, RRR. No murmurs Gastrointestinal system: Abdomen is nondistended, soft and nontender. Normal bowel sounds heard. Central nervous system: Alert and oriented. No focal neurological deficits. Extremities: No edema. No calf tenderness Skin: No cyanosis. No rashes Psychiatry: Judgement and insight appear normal. Mood & affect appropriate.     Data Reviewed: I have personally reviewed following labs and imaging studies  CBC:  Recent Labs Lab 10/09/16 1609 10/14/16 1420 10/14/16 2150 10/15/16 0719  WBC 21.1 Repeated and verified X2.* 14.8* 14.8* 13.0*  NEUTROABS 17.4* 10.6* 12.0*  --   HGB 16.4* 14.8 14.8 15.0  HCT 50.3* 44.4 42.9 44.4  MCV 94.5 94.2 92.3 94.1  PLT 259.0 218.0 211 160   Basic Metabolic Panel:  Recent Labs Lab 10/09/16 1609 10/14/16 1420 10/14/16 2150 10/14/16 2231 10/15/16 0719  NA 126* 126* 126*  --  127*  128*  K 4.2 4.0 4.3  --  4.0  4.0  CL 84* 84* 86*  --  87*  87*  CO2 34* 32 28  --  27  28  GLUCOSE 222* 190* 160*  --  106*  105*  BUN 54* 62* 59*  --  57*  57*  CREATININE 1.08 1.49* 1.17*  --  1.32*  1.32*  CALCIUM 10.2 9.3 9.3  --  8.8*  8.9  MG 1.9  --   --  1.7  --    GFR: Estimated Creatinine Clearance: 71.7 mL/min (A) (by C-G formula based on SCr of 1.32 mg/dL (H)). Liver Function Tests: No results for input(s): AST, ALT, ALKPHOS, BILITOT, PROT, ALBUMIN in the last 168 hours. No results for input(s): LIPASE, AMYLASE in the last 168 hours. No results for input(s): AMMONIA in the last 168 hours. Coagulation Profile: No results for input(s): INR, PROTIME in the last 168 hours. Cardiac Enzymes: No results for input(s): CKTOTAL, CKMB, CKMBINDEX, TROPONINI in the last 168 hours. BNP (last 3 results) No results for input(s): PROBNP in the last 8760 hours. HbA1C: No results for input(s): HGBA1C in the last 72  hours. CBG:  Recent Labs Lab 10/15/16 0001 10/15/16 0742 10/15/16 1204  GLUCAP 132* 103* 198*   Lipid Profile:  Recent Labs  10/15/16 0719  CHOL 149  HDL 47  LDLCALC 65  TRIG 187*  CHOLHDL 3.2   Thyroid Function Tests:  Recent Labs  10/15/16 0719  TSH 14.632*   Anemia Panel: No results for input(s): VITAMINB12, FOLATE, FERRITIN, TIBC, IRON, RETICCTPCT in the last 72 hours. Sepsis Labs: No results for input(s): PROCALCITON, LATICACIDVEN in the last 168 hours.  No results found for this or any previous visit (from the past 240 hour(s)).       Radiology Studies: No results found.      Scheduled Meds: . aspirin EC  81 mg Oral Daily  . bisoprolol  5 mg Oral Daily  . budesonide (PULMICORT) nebulizer solution  1 mg Nebulization BID  . enoxaparin (LOVENOX) injection  70 mg Subcutaneous Daily  . FLUoxetine  20 mg Oral Daily  . insulin aspart  0-5 Units Subcutaneous QHS  . insulin aspart  0-9 Units Subcutaneous TID WC  . irbesartan  75 mg Oral QHS  . levothyroxine  224 mcg Oral QAC breakfast  . montelukast  10 mg Oral QHS  . primidone  75 mg Oral QHS  . simvastatin  40 mg Oral QHS  . sodium chloride flush  3 mL Intravenous Q12H   Continuous Infusions: . sodium chloride 75 mL/hr at 10/15/16 0836     LOS: 0 days     Cordelia Poche, MD [facility] 10/15/2016, 1:23 PM Pager: [phone]  If 7PM-7AM, please contact night-coverage [email or web address] Password [email or web address] 10/15/2016, 1:23 PM

## 2016-10-15 NOTE — Progress Notes (Signed)
PHARMACIST - PHYSICIAN ORDER COMMUNICATION  CONCERNING: P&T Medication Policy on Herbal Medications  DESCRIPTION:  This patient's order for:  Garlic  has been noted.  This product(s) is classified as an "herbal" or natural product. Due to a lack of definitive safety studies or FDA approval, nonstandard manufacturing practices, plus the potential risk of unknown drug-drug interactions while on inpatient medications, the Pharmacy and Therapeutics Committee does not permit the use of "herbal" or natural products of this type within [facility].   ACTION TAKEN: The pharmacy department is unable to verify this order at this time and your patient has been informed of this safety policy. Please reevaluate patient's clinical condition at discharge and address if the herbal or natural product(s) should be resumed at that time.   Thanks Dorrene German 10/15/2016 12:02 AM

## 2016-10-16 ENCOUNTER — Telehealth: Payer: Self-pay | Admitting: *Deleted

## 2016-10-16 LAB — CORTISOL-AM, BLOOD: Cortisol - AM: 1.8 ug/dL — ABNORMAL LOW (ref 6.7–22.6)

## 2016-10-16 LAB — GLUCOSE, CAPILLARY
GLUCOSE-CAPILLARY: 148 mg/dL — AB (ref 65–99)
Glucose-Capillary: 125 mg/dL — ABNORMAL HIGH (ref 65–99)

## 2016-10-16 LAB — URINE CULTURE: Culture: <10000 — AB

## 2016-10-16 LAB — UREA NITROGEN, URINE: Urea Nitrogen, Ur: 491 mg/dL

## 2016-10-16 MED ORDER — CYCLOBENZAPRINE HCL 5 MG PO TABS: 5.0000 mg | ORAL_TABLET | Freq: Three times a day (TID) | ORAL | 1 refills | Status: DC | PRN

## 2016-10-16 MED ORDER — IRBESARTAN 75 MG PO TABS: 75.0000 mg | ORAL_TABLET | Freq: Every day | ORAL | 0 refills | Status: DC

## 2016-10-16 NOTE — Telephone Encounter (Signed)
Rec'd call from [facility] pharmacy pt requesting to have refill on the Cyclobenzaprine. Medication was not rx by PCP pls advise if ok to refill...Johny Chess

## 2016-10-16 NOTE — Telephone Encounter (Signed)
Called pt via personal cell phone, left detailed msg stating results and stressed the importance of going to the ED per Dr. Judi Cong instructions. I also informed pt of phone/fax issue so to contact the office via MyChart if she can.

## 2016-10-16 NOTE — Discharge Instructions (Signed)
Autumn Massey,  You were admitted because of low sodium in addition to compromised kidneys. This appears to be secondary to diuretic use. These have been stopped. Please follow-up with you primary care physician with regard to these changes. Your blood pressure was also on the lower side and your irbesartan was decreased to 75mg  daily.  While you were here, your steroid level was checked. This was low, however, this can be expected with recent steroid use. Please follow-up with your endocrinologist in June (I discussed things with her on the phone). Please continue to be adherent with your synthroid use as variations in adherence can can issues with how effective the regimen is in treating your hypothyroidism. Please keep hydrated.

## 2016-10-16 NOTE — Telephone Encounter (Signed)
-----   Message from Biagio Borg, MD sent at 10/14/2016  4:06 PM EDT ----- Left message on MyChart, pt to cont same tx except  The test results show that your current treatment is OK, except the sodium is still low, and you now have worsening kidney failure (moderate).  Please go to the ED for evaluation.  Also you should be contacted by the office as well.Redmond Baseman to please inform pt, and encourage to go to ED for AKI and hyponatremia

## 2016-10-16 NOTE — Progress Notes (Signed)
Date: October 16, 2016 Discharge orders review for case management needs.  None found Velva Harman, BSN, [place], [place]: [phone]

## 2016-10-16 NOTE — Telephone Encounter (Signed)
Flexeril done erx 

## 2016-10-16 NOTE — Discharge Summary (Signed)
Physician Discharge Summary  Autumn Massey IHK:[id number] DOB: 20-Aug-1965 DOA: 10/14/2016  PCP: [name], MD  Admit date: 10/14/2016 Discharge date: 10/16/2016  Admitted From: Home Disposition: Home  Recommendations for Outpatient Follow-up:  1. Follow up with PCP in 1 week 2. Follow up with endocrinologist in June 3. Please obtain BMP in 1-3 days to recheck sodium 4. Recheck blood pressure  Home Health: None Equipment/Devices: None  Discharge Condition: Stable CODE STATUS: Full code Diet recommendation: Heart healthy   Brief/Interim Summary:  Admission HPI written by Ivor Costa, MD   Chief Complaint: Abnormal lab with hyponatremia and elevated BUN and Cre  HPI: Autumn Massey is a 51 y.o. female with medical history significant of hypertension, hyperlipidemia, diabetes mellitus, COPD, asthma, on 2 L oxygen at night, hypothyroidism, depression, stroke, OSA not on CPAP, chronic venous insufficiency change, polycythemia, obesity, poor hearing, dCHF, who presents with abnormal lab with hyponatremia and elevated BUN and Cre.  Pt states that she was admitted from 3/31-4/4 for COPD/PNA. She followed up with her pcp on 4/12 and had labs drawn then. Her sodium was low and her BUN/Cr were elevated. Her doctor told her to come back today for repeat labs today. She was found to have worsening renal function and hyponatremia, therefore was sent to ED by PCP for further evaluation and treatment. Patient reports muscle cramps. She states that she has chronic shortness breath due to COPD and asthma, which is at her baseline. She has nausea, no vomiting, diarrhea or abdominal pain. Denies any chest pain, fever or chills. No symptoms of UTI or unilateral weakness..   ED Course: pt was found to have Sodium 126, creatinine 1.49, BUN 59, WBC 15.8, urinalysis with trace amount of leukocyte, temperature normal, no tachycardia, oxygen saturation 97% on room air. Patient is placed on telemetry bed for  observation.     Hospital course:  Acute kidney injury Likely secondary to a combination of dehydration in setting of ARB and diuretics. Improved with IV fluids and rehydration.  Hyponatremia Likely secondary to multiple diuretic usage. Diuretics discontinued and IV fluids given. Sodium trended up. Cortisol obtained and was significantly low in setting of recent prednisone usage. Discussed with patient's endocrinologist and she will follow-up as an outpatient. No evidence of adrenal crisis. Continue to follow as an outpatient.   COPD Asthma Stable Continued nebulizer treatments  History of CVA Continued ASA and Zocor  Hypothyroidism Continued Synthroid. TSH elevated at around 14. No increase to Synthroid made.  Leukocytosis Secondary to steroid use. No evidence of infection. Actually downtrended  Hypertension Irbesartan decreased for mild hypotension. bisoprolol continued. Hydrochlorothiazide, spironolactone and furosemide discontinued.   Discharge Diagnoses:  Principal Problem:   AKI (acute kidney injury) ([place]) Active Problems:   Hypothyroidism   HLD (hyperlipidemia)   ANXIETY DEPRESSION   Essential hypertension   Severe obesity (BMI >= 40) (HCC)   History of CVA (cerebrovascular accident)   COPD GOLD 0    Chronic diastolic congestive heart failure ([place])   Hyponatremia   Leukocytosis    Discharge Instructions  Discharge Instructions    Call MD for:    Complete by:  As directed    Decreasing blood pressure   Call MD for:  difficulty breathing, headache or visual disturbances    Complete by:  As directed    Call MD for:  extreme fatigue    Complete by:  As directed    Call MD for:  persistant dizziness or light-headedness    Complete by:  As  directed    Call MD for:  persistant nausea and vomiting    Complete by:  As directed    Diet - low sodium heart healthy    Complete by:  As directed    Increase activity slowly    Complete by:  As directed       Allergies as of 10/16/2016   No Known Allergies     Medication List    STOP taking these medications   furosemide 40 MG tablet Commonly known as:  LASIX   hydrochlorothiazide 25 MG tablet Commonly known as:  HYDRODIURIL   predniSONE 10 MG tablet Commonly known as:  DELTASONE   spironolactone 25 MG tablet Commonly known as:  ALDACTONE     TAKE these medications   acetaminophen 500 MG tablet Commonly known as:  TYLENOL Take 1,000 mg by mouth every 6 (six) hours as needed for mild pain, moderate pain, fever or headache.   albuterol (2.5 MG/3ML) 0.083% nebulizer solution Commonly known as:  PROVENTIL Take 3 mLs (2.5 mg total) by nebulization every 6 (six) hours as needed for wheezing or shortness of breath.   aspirin EC 81 MG tablet Take 81 mg by mouth daily.   bisoprolol 5 MG tablet Commonly known as:  ZEBETA Take 1 tablet (5 mg total) by mouth daily.   cyclobenzaprine 5 MG tablet Commonly known as:  FLEXERIL Take 1 tablet (5 mg total) by mouth 3 (three) times daily as needed for muscle spasms.   dextromethorphan-guaiFENesin 30-600 MG 12hr tablet Commonly known as:  MUCINEX DM Take 1 tablet by mouth 2 (two) times daily as needed for cough.   FLUoxetine 20 MG capsule Commonly known as:  PROZAC TAKE ONE CAPSULE BY MOUTH DAILY. [place] appointment. FOR FUTURE REFILLS.   fluticasone 220 MCG/ACT inhaler Commonly known as:  FLOVENT HFA Inhale 2 puffs into the lungs 2 (two) times daily.   GARLIC PO Take 1 tablet by mouth daily.   glipiZIDE 10 MG tablet Commonly known as:  GLUCOTROL Take 1 tablet (10 mg total) by mouth 2 (two) times daily before a meal.   irbesartan 75 MG tablet Commonly known as:  AVAPRO Take 1 tablet (75 mg total) by mouth at bedtime. What changed:  medication strength  how much to take   levothyroxine 112 MCG tablet Commonly known as:  SYNTHROID, LEVOTHROID Take 2 tablets (224 mcg total) by mouth daily before breakfast.   metFORMIN  500 MG 24 hr tablet Commonly known as:  GLUCOPHAGE-XR Take 1,000 mg by mouth daily with breakfast.   montelukast 10 MG tablet Commonly known as:  SINGULAIR Take 1 tablet (10 mg total) by mouth at bedtime.   ondansetron 4 MG disintegrating tablet Commonly known as:  ZOFRAN ODT Take 1 tablet (4 mg total) by mouth every 8 (eight) hours as needed for nausea or vomiting.   primidone 50 MG tablet Commonly known as:  MYSOLINE TAKE 2 AND 1/2 TABLETS BY MOUTH AT BEDTIME. Please schedule follow up visit   simvastatin 40 MG tablet Commonly known as:  ZOCOR Take 1 tablet (40 mg total) by mouth at bedtime. Keep July appt for future refills      Follow-up Information    [name], MD. Schedule an appointment as soon as possible for a visit in 1 week(s).   Specialties:  Internal Medicine, Radiology Contact information: [place] [address] [phone]        Philemon Kingdom, MD. Go to.  Specialty:  Internal Medicine Why:  in June for scheduled appointment Contact information: [address] [place] [address] [phone]          No Known Allergies  Consultations:  None   Procedures/Studies: Dg Chest 2 View  Result Date: 09/30/2016 CLINICAL DATA:  Shortness of breath, cough, and wheezing. EXAM: CHEST  2 VIEW COMPARISON:  Chest x-ray of September 27, 2016 and CT scan of chest of December 28, 2015. FINDINGS: The lungs are adequately inflated. The interstitial markings remain increased. The cardiac silhouette remains enlarged. The pulmonary vascularity remains engorged. The bony thorax exhibits no acute abnormality. IMPRESSION: CHF with mild pulmonary interstitial edema. No alveolar infiltrate or pleural effusion. Electronically Signed   By: David  Martinique M.D.   On: 09/30/2016 12:00   Dg Chest 2 View  Result Date: 09/27/2016 CLINICAL DATA:  Acute onset of shortness of breath and wheezing. Initial encounter. EXAM: CHEST  2 VIEW  COMPARISON:  Chest radiograph and CT of the chest performed 12/28/2015 FINDINGS: The lungs are well-aerated. Vascular congestion is noted. Increased interstitial markings raise concern for pulmonary edema. Underlying right basilar pneumonia cannot be excluded. No pleural effusion or pneumothorax is seen. The heart is normal in size; the mediastinal contour is within normal limits. No acute osseous abnormalities are seen. IMPRESSION: Vascular congestion. Increased interstitial markings raise concern for pulmonary edema. Underlying right basilar pneumonia cannot be excluded. Electronically Signed   By: Garald Balding M.D.   On: 09/27/2016 23:23      Subjective: Patient reports improvement of fatigue.  Discharge Exam: Vitals:   10/15/16 2048 10/16/16 0534  BP: (!) 111/58 112/61  Pulse: 67 60  Resp: 18 18  Temp: 97.9 F (36.6 C) 97.9 F (36.6 C)   Vitals:   10/15/16 2048 10/16/16 0500 10/16/16 0534 10/16/16 0808  BP: (!) 111/58  112/61   Pulse: 67  60   Resp: 18  18   Temp: 97.9 F (36.6 C)  97.9 F (36.6 C)   TempSrc: Oral  Oral   SpO2: 95%  100% 95%  Weight:  (!) 147.9 kg (326 lb)    Height:        General: Pt is alert, awake, not in acute distress Cardiovascular: RRR, S1/S2 +, no rubs, no gallops Respiratory: CTA bilaterally, no wheezing, no rhonchi Abdominal: Soft, NT, ND, bowel sounds + Extremities: no edema, no cyanosis Skin: chronic dermatitis of lower extremities    The results of significant diagnostics from this hospitalization (including imaging, microbiology, ancillary and laboratory) are listed below for reference.     Labs: BNP (last 3 results)  Recent Labs  12/28/15 1426 09/29/16 0436 10/14/16 2358  BNP 25.6 61.1 63.1   Basic Metabolic Panel:  Recent Labs Lab 10/09/16 1609 10/14/16 1420 10/14/16 2150 10/14/16 2231 10/15/16 0719 10/15/16 1452 10/15/16 2050  NA 126* 126* 126*  --  127*  128* 126* 128*  K 4.2 4.0 4.3  --  4.0  4.0 4.4 4.7   CL 84* 84* 86*  --  87*  87* 87* 89*  CO2 34* 32 28  --  27  28 28 31   GLUCOSE 222* 190* 160*  --  106*  105* 207* 187*  BUN 54* 62* 59*  --  57*  57* 54* 45*  CREATININE 1.08 1.49* 1.17*  --  1.32*  1.32* 1.09* 0.98  CALCIUM 10.2 9.3 9.3  --  8.8*  8.9 9.1 9.4  MG 1.9  --   --  1.7  --   --   --    Liver Function Tests:  Recent Labs Lab 10/15/16 1452  PROT 6.8   CBC:  Recent Labs Lab 10/09/16 1609 10/14/16 1420 10/14/16 2150 10/15/16 0719  WBC 21.1 Repeated and verified X2.* 14.8* 14.8* 13.0*  NEUTROABS 17.4* 10.6* 12.0*  --   HGB 16.4* 14.8 14.8 15.0  HCT 50.3* 44.4 42.9 44.4  MCV 94.5 94.2 92.3 94.1  PLT 259.0 218.0 211 198   CBG:  Recent Labs Lab 10/15/16 0742 10/15/16 1204 10/15/16 1645 10/15/16 2153 10/16/16 0740  GLUCAP 103* 198* 215* 169* 125*   Lipid Profile  Recent Labs  10/15/16 0719  CHOL 149  HDL 47  LDLCALC 65  TRIG 187*  CHOLHDL 3.2   Thyroid function studies  Recent Labs  10/15/16 0719  TSH 14.632*    Urinalysis    Component Value Date/Time   COLORURINE STRAW (A) 10/14/2016 2202   APPEARANCEUR CLEAR 10/14/2016 2202   LABSPEC 1.009 10/14/2016 2202   PHURINE 5.0 10/14/2016 2202   GLUCOSEU NEGATIVE 10/14/2016 2202   GLUCOSEU NEGATIVE 11/22/2014 1403   HGBUR SMALL (A) 10/14/2016 2202   BILIRUBINUR NEGATIVE 10/14/2016 2202   KETONESUR NEGATIVE 10/14/2016 2202   PROTEINUR NEGATIVE 10/14/2016 2202   UROBILINOGEN 0.2 11/22/2014 1403   NITRITE NEGATIVE 10/14/2016 2202   LEUKOCYTESUR TRACE (A) 10/14/2016 2202    Time coordinating discharge: Over 30 minutes  SIGNED:   Cordelia Poche, MD [facility] 10/16/2016, 9:44 AM Pager [phone]  If 7PM-7AM, please contact night-coverage [email or web address] Password [email or web address]

## 2016-10-16 NOTE — Progress Notes (Signed)
Pt being discharged from the unit via wheelchair. Discharge instructions were reviewed with the pt. No questions or concerns at this time.  Peyton Spengler W Brinson Tozzi, RN

## 2016-10-16 NOTE — Telephone Encounter (Signed)
Patient called stating she went to the Ed on Tuesday and was admitted to [facility].  FYI

## 2016-10-21 ENCOUNTER — Other Ambulatory Visit (INDEPENDENT_AMBULATORY_CARE_PROVIDER_SITE_OTHER): Payer: Medicare Other

## 2016-10-21 ENCOUNTER — Encounter: Payer: Self-pay | Admitting: Internal Medicine

## 2016-10-21 ENCOUNTER — Ambulatory Visit (INDEPENDENT_AMBULATORY_CARE_PROVIDER_SITE_OTHER): Payer: Medicare Other | Admitting: Internal Medicine

## 2016-10-21 VITALS — BP 112/64 | HR 71 | Ht 62.0 in | Wt 327.0 lb

## 2016-10-21 DIAGNOSIS — N179 Acute kidney failure, unspecified: Secondary | ICD-10-CM | POA: Diagnosis not present

## 2016-10-21 DIAGNOSIS — E2740 Unspecified adrenocortical insufficiency: Secondary | ICD-10-CM | POA: Diagnosis not present

## 2016-10-21 DIAGNOSIS — E871 Hypo-osmolality and hyponatremia: Secondary | ICD-10-CM

## 2016-10-21 DIAGNOSIS — I5032 Chronic diastolic (congestive) heart failure: Secondary | ICD-10-CM

## 2016-10-21 DIAGNOSIS — R7989 Other specified abnormal findings of blood chemistry: Secondary | ICD-10-CM | POA: Insufficient documentation

## 2016-10-21 LAB — BASIC METABOLIC PANEL
BUN: 16 mg/dL (ref 6–23)
CO2: 31 mEq/L (ref 19–32)
Calcium: 9.4 mg/dL (ref 8.4–10.5)
Chloride: 100 mEq/L (ref 96–112)
Creatinine, Ser: 0.77 mg/dL (ref 0.40–1.20)
GFR: 84.05 mL/min (ref >60.00)
GLUCOSE: 131 mg/dL — AB (ref 70–99)
POTASSIUM: 4.5 meq/L (ref 3.5–5.1)
SODIUM: 138 meq/L (ref 135–145)

## 2016-10-21 MED ORDER — BISOPROLOL-HYDROCHLOROTHIAZIDE 5-6.25 MG PO TABS: 1.0000 | ORAL_TABLET | Freq: Every day | ORAL | 3 refills | Status: DC

## 2016-10-21 NOTE — Assessment & Plan Note (Signed)
Ok for trial change zebeta to ziac today as rx, f/u 2 wks

## 2016-10-21 NOTE — Progress Notes (Signed)
Subjective:    Patient ID: Autumn Massey, female    DOB: 08-14-1965, 51 y.o.   MRN: [id number]  HPI here to f/u post hospn apr 17-19 with AKI, low sodium, in the setting of morbid obesity, HTn, HLD, DM, COPD/asthma, chronic resp hypoxic failure on 2L [place] at hight, hypothyroid, stroke, OSA not on CPAP, dCHF  Had been recently admitted with COPD/PNA earlier in the month.  Reported leg cramps at ED this last admit, with chronic sob no change, and without GI symptoms such as n/v, abd pain, no UTI symptoms or focal nejuro changes. Pt had been on prior diuretic and ARB.  These were held, and pt improved renal fxn with IVF.  Cortisol found low in setting of recent prednisone, has appt with endo in June.  Elevated WBC felt secondary to steroid use, and improving.  Irbesartan decreased and bisoprolol cont'd with lower BP,  HCT, aldactone, and lasix stopped.    Pt denies polydipsia, polyuria, or low sugar symptoms such as weakness or confusion improved with po intake except to 2 times in the past wk, and cbg's in 70's. .  Pt states overall good compliance with meds, trying to follow lower cholesterol, diabetic diet, wt overall stable but little exercise however. Taking the metformin with cbg's in low 100's.  Feels today overall much improved with recent fluids and med changes.  Pt denies chest pain, increased sob or doe, wheezing, orthopnea, PND, palpitations, dizziness or syncope. Has noticed increased right leg only edema off the diuretics,  Wt overall some increased. Did take one her lasix yesterday despites stopped at last d/c.   Wt Readings from Last 3 Encounters:  10/21/16 (!) 327 lb (148.3 kg)  10/16/16 (!) 326 lb (147.9 kg)  10/09/16 (!) 322 lb 3.2 oz (146.1 kg)  pt states regular bisoprolol Is not on the $4 list, BUT bisoprolol HCT is, so would be much more affordable.   Past Medical History:  Diagnosis Date  . ANXIETY DEPRESSION 01/29/2009  . DEPRESSION 11/19/2007  . Diabetes ([place])   . FREQUENCY,  URINARY 10/17/2009  . HYPERLIPIDEMIA 11/19/2007  . HYPERTENSION 11/19/2007  . HYPOTHYROIDISM 11/19/2007  . Morbid obesity ([place])   . PNEUMONIA 08/06/2010  . POLYCYTHEMIA 01/29/2009  . Preventative health care 04/06/2011  . Sleep apnea    mild sleep apnea, on o2 at 2l at nighttime  . SLEEP RELATED HYPOVENTILATION/HYPOXEMIA CCE 02/08/2009  . Venous insufficiency 07/19/2012   Past Surgical History:  Procedure Laterality Date  . TONSILLECTOMY    . ULNAR TUNNEL RELEASE Left 02/22/2013   Procedure: LEFT ULNAR NERVE DECOMPRESSION ;  Surgeon: Tennis Must, MD;  Location: [facility];  Service: Orthopedics;  Laterality: Left;    reports that she quit smoking about 17 months ago. Her smoking use included Cigarettes. She has a 11.00 pack-year smoking history. She has never used smokeless tobacco. She reports that she drinks alcohol. She reports that she does not use drugs. family history includes Alcohol abuse in her other; Arthritis in her other; Cancer in her maternal grandfather and mother; Hypertension in her other. No Known Allergies Current Outpatient Prescriptions on File Prior to Visit  Medication Sig Dispense Refill  . acetaminophen (TYLENOL) 500 MG tablet Take 1,000 mg by mouth every 6 (six) hours as needed for mild pain, moderate pain, fever or headache.     . albuterol (PROVENTIL) (2.5 MG/3ML) 0.083% nebulizer solution Take 3 mLs (2.5 mg total) by nebulization every 6 (six) hours as needed for  wheezing or shortness of breath. 75 mL 0  . aspirin EC 81 MG tablet Take 81 mg by mouth daily.    . bisoprolol (ZEBETA) 5 MG tablet Take 1 tablet (5 mg total) by mouth daily. 30 tablet 11  . cyclobenzaprine (FLEXERIL) 5 MG tablet Take 1 tablet (5 mg total) by mouth 3 (three) times daily as needed for muscle spasms. 40 tablet 1  . dextromethorphan-guaiFENesin (MUCINEX DM) 30-600 MG 12hr tablet Take 1 tablet by mouth 2 (two) times daily as needed for cough.    [name] FLUoxetine (PROZAC) 20 MG capsule  TAKE ONE CAPSULE BY MOUTH DAILY. [place] appointment. FOR FUTURE REFILLS. 90 capsule 2  . fluticasone (FLOVENT HFA) 220 MCG/ACT inhaler Inhale 2 puffs into the lungs 2 (two) times daily. 1 Inhaler 0  . GARLIC PO Take 1 tablet by mouth daily.    [name] glipiZIDE (GLUCOTROL) 10 MG tablet Take 1 tablet (10 mg total) by mouth 2 (two) times daily before a meal. 60 tablet 0  . irbesartan (AVAPRO) 75 MG tablet Take 1 tablet (75 mg total) by mouth at bedtime. 30 tablet 0  . levothyroxine (SYNTHROID, LEVOTHROID) 112 MCG tablet Take 2 tablets (224 mcg total) by mouth daily before breakfast. 30 tablet 0  . metFORMIN (GLUCOPHAGE-XR) 500 MG 24 hr tablet Take 1,000 mg by mouth daily with breakfast.   5  . montelukast (SINGULAIR) 10 MG tablet Take 1 tablet (10 mg total) by mouth at bedtime. 30 tablet 0  . ondansetron (ZOFRAN ODT) 4 MG disintegrating tablet Take 1 tablet (4 mg total) by mouth every 8 (eight) hours as needed for nausea or vomiting. 20 tablet 1  . primidone (MYSOLINE) 50 MG tablet TAKE 2 AND 1/2 TABLETS BY MOUTH AT BEDTIME. Please schedule follow up visit 75 tablet 0  . simvastatin (ZOCOR) 40 MG tablet Take 1 tablet (40 mg total) by mouth at bedtime. Keep July appt for future refills 30 tablet 0   No current facility-administered medications on file prior to visit.    Review of Systems  Constitutional: Negative for other unusual diaphoresis or sweats HENT: Negative for ear discharge or swelling Eyes: Negative for other worsening visual disturbances Respiratory: Negative for stridor or other swelling  Gastrointestinal: Negative for worsening distension or other blood Genitourinary: Negative for retention or other urinary change Musculoskeletal: Negative for other MSK pain or swelling Skin: Negative for color change or other new lesions Neurological: Negative for worsening tremors and other numbness  Psychiatric/Behavioral: Negative for worsening agitation or other fatigue Leg cramps  resolved All. Other sytems neg per pt     Objective:   Physical Exam BP 112/64   Pulse 71   Ht 5\' 2"  (1.575 m)   Wt (!) 327 lb (148.3 kg)   SpO2 96%   BMI 59.81 kg/m  VS noted,  Constitutional: Pt appears in NAD HENT: Head: NCAT.  Right Ear: External ear normal.  Left Ear: External ear normal.  Eyes: . Pupils are equal, round, and reactive to light. Conjunctivae and EOM are normal Nose: without d/c or deformity Neck: Neck supple. Gross normal ROM Cardiovascular: Normal rate and regular rhythm.   Pulmonary/Chest: Effort normal and breath sounds without rales or wheezing.  Abd:  Soft, NT, ND, + BS, no organomegaly Neurological: Pt is alert. At baseline orientation, motor grossly intact Skin: Skin is warm. No rashes, other new lesions, has trace bialt LE edema right > left Psychiatric: Pt behavior is normal without agitation , mild nervous No  other exam findings    Assessment & Plan:

## 2016-10-21 NOTE — Assessment & Plan Note (Signed)
With low volume recently due to over diuresis, now off diuretics except for change zebeta to ziac today, to f/u 2 wks, for BMP today

## 2016-10-21 NOTE — Progress Notes (Signed)
Pre visit review using our clinic review tool, if applicable. No additional management support is needed unless otherwise documented below in the visit note. 

## 2016-10-21 NOTE — Patient Instructions (Addendum)
Ok to change the zebeta 5 mg to the Ziac 5/6.25 mg daily  Please call if your weight or swelling worsen  Please continue all other medications as before, and refills have been done if requested.  Please have the pharmacy call with any other refills you may need.  Please continue your efforts at being more active, low cholesterol diet, and weight control.  You are otherwise up to date with prevention measures today.  Please keep your appointments with your specialists as you may have planned  Please go to the LAB in the Basement (turn left off the elevator) for the tests to be done today  You will be contacted by phone if any changes need to be made immediately.  Otherwise, you will receive a letter about your results with an explanation, but please check with MyChart first.  Please remember to sign up for MyChart if you have not done so, as this will be important to you in the future with finding out test results, communicating by private email, and scheduling acute appointments online when needed.  Please return in 2 weeks, or sooner if needed

## 2016-10-21 NOTE — Assessment & Plan Note (Signed)
Has f/u planned for [facility] in June

## 2016-10-21 NOTE — Assessment & Plan Note (Signed)
stable overall by history and exam, recent data reviewed with pt, and pt to continue medical treatment as before,  to f/u any worsening symptoms or concerns Lab Results  Component Value Date   CREATININE 0.98 10/15/2016  for f/u lab

## 2016-10-24 ENCOUNTER — Other Ambulatory Visit: Payer: Self-pay | Admitting: Internal Medicine

## 2016-10-24 NOTE — Telephone Encounter (Signed)
Done erx 

## 2016-11-04 ENCOUNTER — Ambulatory Visit (INDEPENDENT_AMBULATORY_CARE_PROVIDER_SITE_OTHER): Payer: Medicare Other | Admitting: Internal Medicine

## 2016-11-04 ENCOUNTER — Other Ambulatory Visit (INDEPENDENT_AMBULATORY_CARE_PROVIDER_SITE_OTHER): Payer: Medicare Other

## 2016-11-04 ENCOUNTER — Encounter: Payer: Self-pay | Admitting: Internal Medicine

## 2016-11-04 VITALS — BP 112/68 | HR 68 | Ht 61.0 in | Wt 325.0 lb

## 2016-11-04 DIAGNOSIS — I10 Essential (primary) hypertension: Secondary | ICD-10-CM

## 2016-11-04 DIAGNOSIS — N179 Acute kidney failure, unspecified: Secondary | ICD-10-CM

## 2016-11-04 DIAGNOSIS — R300 Dysuria: Secondary | ICD-10-CM | POA: Diagnosis not present

## 2016-11-04 DIAGNOSIS — E871 Hypo-osmolality and hyponatremia: Secondary | ICD-10-CM

## 2016-11-04 DIAGNOSIS — Z23 Encounter for immunization: Secondary | ICD-10-CM

## 2016-11-04 LAB — URINALYSIS, ROUTINE W REFLEX MICROSCOPIC
Bilirubin Urine: NEGATIVE
Hgb urine dipstick: NEGATIVE
Ketones, ur: NEGATIVE
Nitrite: NEGATIVE
RBC / HPF: NONE SEEN (ref 0–?)
SPECIFIC GRAVITY, URINE: 1.015 (ref 1.000–1.030)
Total Protein, Urine: 30 — AB
URINE GLUCOSE: NEGATIVE
UROBILINOGEN UA: 0.2 (ref 0.0–1.0)
pH: 7 (ref 5.0–8.0)

## 2016-11-04 LAB — BASIC METABOLIC PANEL
BUN: 19 mg/dL (ref 6–23)
CALCIUM: 9.4 mg/dL (ref 8.4–10.5)
CHLORIDE: 99 meq/L (ref 96–112)
CO2: 34 mEq/L — ABNORMAL HIGH (ref 19–32)
CREATININE: 0.83 mg/dL (ref 0.40–1.20)
GFR: 77.06 mL/min (ref >60.00)
Glucose, Bld: 123 mg/dL — ABNORMAL HIGH (ref 70–99)
Potassium: 4.4 mEq/L (ref 3.5–5.1)
SODIUM: 140 meq/L (ref 135–145)

## 2016-11-04 MED ORDER — FUROSEMIDE 40 MG PO TABS: 40.0000 mg | ORAL_TABLET | ORAL | 3 refills | Status: DC | PRN

## 2016-11-04 MED ORDER — CEPHALEXIN 500 MG PO CAPS: 500.0000 mg | ORAL_CAPSULE | Freq: Three times a day (TID) | ORAL | 0 refills | Status: AC

## 2016-11-04 NOTE — Assessment & Plan Note (Signed)
stable overall by history and exam, recent data reviewed with pt, and pt to continue medical treatment as before,  to f/u any worsening symptoms or concerns BP Readings from Last 3 Encounters:  11/04/16 112/68  10/21/16 112/64  10/16/16 (!) 118/54

## 2016-11-04 NOTE — Progress Notes (Signed)
Pre visit review using our clinic review tool, if applicable. No additional management support is needed unless otherwise documented below in the visit note. 

## 2016-11-04 NOTE — Assessment & Plan Note (Signed)
For f/u lab today on very low dose hct and lasix 40 qod

## 2016-11-04 NOTE — Patient Instructions (Addendum)
Please take all new medication as prescribed - the antibiotic  You had the pneumovax pneumonia shot today  Please continue all other medications as before, including the lasix 40 mg every other day  Please have the pharmacy call with any other refills you may need.  Please continue your efforts at being more active, low cholesterol diet, and weight control..  Please keep your appointments with your specialists as you may have planned  Please go to the LAB in the Basement (turn left off the elevator) for the tests to be done today  You will be contacted by phone if any changes need to be made immediately.  Otherwise, you will receive a letter about your results with an explanation, but please check with MyChart first.  Please remember to sign up for MyChart if you have not done so, as this will be important to you in the future with finding out test results, communicating by private email, and scheduling acute appointments online when needed.

## 2016-11-04 NOTE — Assessment & Plan Note (Signed)
Improved last visit, likley to be stable but will need check bmp

## 2016-11-04 NOTE — Assessment & Plan Note (Signed)
c/w likely uti, for empiric antibx, for urine studies,  to f/u any worsening symptoms or concerns

## 2016-11-04 NOTE — Progress Notes (Signed)
Subjective:    Patient ID: Autumn Massey, female    DOB: December 27, 1965, 51 y.o.   MRN: [id number]  HPI  Here to f/u after med changes last visit, including taking hct   with ziac instead of daily lasix which led to overdiuresis, AKI and low sodium Wt Readings from Last 3 Encounters:  11/04/16 (!) 325 lb (147.4 kg)  10/21/16 (!) 327 lb (148.3 kg)  10/16/16 (!) 326 lb (147.9 kg)  She went back taking the lasix qod since last vist due to worsening edema,, has been c/w diet sodium, but some right > left edema returning, better in the am after leg elevation, worse again in the evening. Also being considered for aldactone use for elevated testosterone.  Also c/o incidental 2-3 days dysuria but Denies urinary symptoms such as frequency, urgency, flank pain, hematuria or n/v, fever, chills. Past Medical History:  Diagnosis Date  . ANXIETY DEPRESSION 01/29/2009  . DEPRESSION 11/19/2007  . Diabetes ([place])   . FREQUENCY, URINARY 10/17/2009  . HYPERLIPIDEMIA 11/19/2007  . HYPERTENSION 11/19/2007  . HYPOTHYROIDISM 11/19/2007  . Morbid obesity ([place])   . PNEUMONIA 08/06/2010  . POLYCYTHEMIA 01/29/2009  . Preventative health care 04/06/2011  . Sleep apnea    mild sleep apnea, on o2 at 2l at nighttime  . SLEEP RELATED HYPOVENTILATION/HYPOXEMIA CCE 02/08/2009  . Venous insufficiency 07/19/2012   Past Surgical History:  Procedure Laterality Date  . TONSILLECTOMY    . ULNAR TUNNEL RELEASE Left 02/22/2013   Procedure: LEFT ULNAR NERVE DECOMPRESSION ;  Surgeon: Tennis Must, MD;  Location: [facility];  Service: Orthopedics;  Laterality: Left;    reports that she quit smoking about 17 months ago. Her smoking use included Cigarettes. She has a 11.00 pack-year smoking history. She has never used smokeless tobacco. She reports that she drinks alcohol. She reports that she does not use drugs. family history includes Alcohol abuse in her other; Arthritis in her other; Cancer in her maternal grandfather and  mother; Hypertension in her other. No Known Allergies Current Outpatient Prescriptions on File Prior to Visit  Medication Sig Dispense Refill  . acetaminophen (TYLENOL) 500 MG tablet Take 1,000 mg by mouth every 6 (six) hours as needed for mild pain, moderate pain, fever or headache.     . albuterol (PROVENTIL) (2.5 MG/3ML) 0.083% nebulizer solution Take 3 mLs (2.5 mg total) by nebulization every 6 (six) hours as needed for wheezing or shortness of breath. 75 mL 0  . aspirin EC 81 MG tablet Take 81 mg by mouth daily.    . bisoprolol-hydrochlorothiazide (ZIAC) 5-6.25 MG tablet Take 1 tablet by mouth daily. 90 tablet 3  . cyclobenzaprine (FLEXERIL) 5 MG tablet Take 1 tablet (5 mg total) by mouth 3 (three) times daily as needed for muscle spasms. 40 tablet 1  . FLUoxetine (PROZAC) 20 MG capsule TAKE ONE CAPSULE BY MOUTH DAILY. [place] appointment. FOR FUTURE REFILLS. 90 capsule 2  . fluticasone (FLOVENT HFA) 220 MCG/ACT inhaler Inhale 2 puffs into the lungs 2 (two) times daily. 1 Inhaler 0  . GARLIC PO Take 1 tablet by mouth daily.    [name] glipiZIDE (GLUCOTROL) 10 MG tablet Take 1 tablet (10 mg total) by mouth 2 (two) times daily before a meal. 60 tablet 0  . irbesartan (AVAPRO) 75 MG tablet Take 1 tablet (75 mg total) by mouth at bedtime. 30 tablet 0  . levothyroxine (SYNTHROID, LEVOTHROID) 112 MCG tablet Take 2 tablets (224 mcg total) by mouth daily  before breakfast. 30 tablet 0  . metFORMIN (GLUCOPHAGE-XR) 500 MG 24 hr tablet Take 1,000 mg by mouth daily with breakfast.   5  . montelukast (SINGULAIR) 10 MG tablet Take 1 tablet (10 mg total) by mouth at bedtime. 30 tablet 0  . primidone (MYSOLINE) 50 MG tablet TAKE 2 AND 1/2 TABLETS BY MOUTH AT BEDTIME. Please schedule follow up visit 75 tablet 0  . simvastatin (ZOCOR) 40 MG tablet Take 1 tablet (40 mg total) by mouth at bedtime. Keep July appt for future refills 30 tablet 0   No current facility-administered medications on file prior to visit.     Review of Systems  Constitutional: Negative for other unusual diaphoresis or sweats HENT: Negative for ear discharge or swelling Eyes: Negative for other worsening visual disturbances Respiratory: Negative for stridor or other swelling  Gastrointestinal: Negative for worsening distension or other blood Genitourinary: Negative for retention or other urinary change Musculoskeletal: Negative for other MSK pain or swelling Skin: Negative for color change or other new lesions Neurological: Negative for worsening tremors and other numbness  Psychiatric/Behavioral: Negative for worsening agitation or other fatigue All other system neg per pt    Objective:   Physical Exam BP 112/68   Pulse 68   Ht 5\' 1"  (1.549 m)   Wt (!) 325 lb (147.4 kg)   SpO2 99%   BMI 61.41 kg/m  VS noted,  Constitutional: Pt appears in NAD HENT: Head: NCAT.  Right Ear: External ear normal.  Left Ear: External ear normal.  Eyes: . Pupils are equal, round, and reactive to light. Conjunctivae and EOM are normal Nose: without d/c or deformity Neck: Neck supple. Gross normal ROM Cardiovascular: Normal rate and regular rhythm.   Pulmonary/Chest: Effort normal and breath sounds without rales or wheezing.  Abd:  Soft, NT, ND, + BS, no organomegaly, no flank tender Neurological: Pt is alert. At baseline orientation, motor grossly intact Skin: Skin is warm. No rashes, other new lesions, trace to 1+ bilat LE edema to knees right > left Psychiatric: Pt behavior is normal without agitation      Assessment & Plan:

## 2016-11-05 LAB — URINE CULTURE

## 2016-11-10 ENCOUNTER — Telehealth: Payer: Self-pay | Admitting: Internal Medicine

## 2016-11-10 NOTE — Telephone Encounter (Signed)
lmtcb for pt.  

## 2016-11-11 MED ORDER — MONTELUKAST SODIUM 10 MG PO TABS: 10.0000 mg | ORAL_TABLET | Freq: Every day | ORAL | 0 refills | Status: DC

## 2016-11-11 NOTE — Telephone Encounter (Signed)
Spoke with pt, rx sent to preferred pharmacy to last until [place].  Stressed the importance of keeping ov for future refills.  Will close encounter.

## 2016-11-11 NOTE — Telephone Encounter (Signed)
Ok but needs ov with MW  [facility]

## 2016-11-11 NOTE — Telephone Encounter (Signed)
Patient returned phone call, scheduled OV for 12/02/2016 at 12:00pm..ert

## 2016-11-18 ENCOUNTER — Telehealth: Payer: Self-pay | Admitting: Neurology

## 2016-11-18 ENCOUNTER — Other Ambulatory Visit: Payer: Self-pay | Admitting: Internal Medicine

## 2016-11-18 NOTE — Telephone Encounter (Signed)
LMOM making patient aware we would not refill medication. Not seen since January 2017- was to come back in July of last year. She has appt scheduled, but she will need to get medication from PCP before appt with Dr. Tomi Likens.

## 2016-11-18 NOTE — Telephone Encounter (Signed)
Received a call from PT regarding medication: Primidone.  Patient needs a refill of medication. Yes  Patient having side effects from medication. No  Patient calling to update [name] on medication. Np

## 2016-11-18 NOTE — Telephone Encounter (Signed)
She said she uses the [facility] on [address]

## 2016-11-19 ENCOUNTER — Telehealth: Payer: Self-pay

## 2016-11-19 MED ORDER — FLUCONAZOLE 150 MG PO TABS: ORAL_TABLET | ORAL | 1 refills | Status: DC

## 2016-11-19 NOTE — Addendum Note (Signed)
Addended by: Biagio Borg on: 11/19/2016 12:21 PM   Modules accepted: Orders

## 2016-11-19 NOTE — Telephone Encounter (Signed)
Pt stated that she has had a yeast infection since taking the antibiotic for the UTI. She would like to know if you could send something in for her?

## 2016-11-19 NOTE — Telephone Encounter (Signed)
Done erx 

## 2016-11-29 ENCOUNTER — Other Ambulatory Visit: Payer: Self-pay | Admitting: Internal Medicine

## 2016-12-01 ENCOUNTER — Encounter: Payer: Self-pay | Admitting: Internal Medicine

## 2016-12-01 ENCOUNTER — Ambulatory Visit (INDEPENDENT_AMBULATORY_CARE_PROVIDER_SITE_OTHER): Payer: Medicare Other | Admitting: Internal Medicine

## 2016-12-01 VITALS — BP 128/84 | HR 73 | Wt 327.0 lb

## 2016-12-01 DIAGNOSIS — E1165 Type 2 diabetes mellitus with hyperglycemia: Secondary | ICD-10-CM | POA: Diagnosis not present

## 2016-12-01 DIAGNOSIS — E282 Polycystic ovarian syndrome: Secondary | ICD-10-CM | POA: Diagnosis not present

## 2016-12-01 DIAGNOSIS — E039 Hypothyroidism, unspecified: Secondary | ICD-10-CM

## 2016-12-01 LAB — T4, FREE: Free T4: 0.86 ng/dL (ref 0.60–1.60)

## 2016-12-01 LAB — TSH: TSH: 8.74 u[IU]/mL — AB (ref 0.35–4.50)

## 2016-12-01 NOTE — Progress Notes (Addendum)
Patient ID: Autumn Massey, female   DOB: 1966-01-29, 51 y.o.   MRN: [id number]   HPI  Autumn Massey is a 51 y.o.-year-old female, returning for f/u for DM2, hypothyroidism, and PCOS. Last visit 3 mo ago. On disability now.  She was admitted for COPD exacerbation and then hyponatremia and AKI recently >> Lasix, HCTZ, Spironolactone stopped. Now restarted Lasix and Spironolactone qod and HCTZ daily.  PCOS: Reviewed and addended  hx:  - weight gain: Maximal weight 364  - was on Phentermine, which worked initially, then stopped working  - Would consider gastric bypass but only as a last resort - + hirsutism >> started to see facial hair in her 30s - + hair loss >> female pattern, and bald spot in vertex - no acne - + fatigue - had a Mirena IUD placed 04/2013 for heavy menstrual cycles  - has frequent Prednisone tapers   We started Spironolactone >> 50 mg bid, but potassium increased >> we had to decrease to 25 mg bid. She was advised to stop it after her AKI, but she restarted it qod.   Last potassium: Lab Results  Component Value Date   K 4.4 11/04/2016   Hypothyroidism: Pt. has been dx with hypothyroidism in 2005.  Pt is on levothyroxine 224 mcg daily, taken: - in am - daily! - fasting - at least 30 min from b'fast - no Ca, Fe, MVI, PPIs - not on Biotin  I reviewed pt's thyroid tests - latest levels high: Lab Results  Component Value Date   TSH 14.632 (H) 10/15/2016   TSH 9.96 (H) 04/22/2016   TSH 2.25 07/23/2015   TSH 1.56 11/22/2014   TSH 1.692 05/24/2014   TSH 0.33 (L) 03/08/2014   TSH 0.33 (L) 01/27/2014   TSH 1.91 01/10/2014   TSH 4.88 (H) 11/29/2013   TSH 10.04 (H) 10/18/2013   FREET4 1.05 10/15/2016   FREET4 0.77 04/22/2016   FREET4 1.08 07/23/2015   FREET4 0.91 11/22/2014   FREET4 1.21 05/24/2014   FREET4 1.05 03/08/2014   FREET4 1.38 01/27/2014   FREET4 1.16 01/10/2014   FREET4 0.86 11/29/2013   FREET4 0.75 10/18/2013    Pt denies: - feeling  nodules in neck - hoarseness - dysphagia - choking - SOB with lying down  She is on neurontin 3x a day (for tremors) but had to decrease to 2x a day today b/c somnolence.  She is on Tegretol to help with the tremors. She also tried Primidone >> may restart after he goes back to Dr Tomi Likens now that she has insurance..   DM2: - + Prednisone in the hospital >> sugars 300-400  Reviewed HbA1c: Lab Results  Component Value Date   HGBA1C 9.5 (H) 09/29/2016   HGBA1C 9.3 (H) 09/28/2016   HGBA1C 8.6 09/16/2016   On: - Metformin 1000 mg in am - Glipizide 10 mg 2x a day -  b'fast and lunch (started in the hospital) >> stopped b/c lows: 70s - decreased to once a day in am  Not taking Januvia >> not covered.  She checks sugars 1-2x a day: - am: 90-110 >> 97-121 >> 120-140s >> 120-140 - after lunch: 143-171 >> 147-187 (Prednisone) >> n/c >> 145-160, 177 - after dinner: 140s-180 >> 160-170 >> 150-160s  She quit smoking in 05/2015.  ROS: Constitutional: no weight gain/no weight loss, no fatigue, no subjective hyperthermia, no subjective hypothermia Eyes: no blurry vision, no xerophthalmia ENT: no sore throat, no nodules palpated in throat, no dysphagia, no  odynophagia, no hoarseness Cardiovascular: no CP/no SOB/no palpitations/+ R leg swelling Respiratory: no cough/no SOB/no wheezing Gastrointestinal: + N/no V/no D/no C/no acid reflux Musculoskeletal: no muscle aches/no joint aches Skin: no rashes, no hair loss Neurological: + tremors/no numbness/no tingling/no dizziness  I reviewed pt's medications, allergies, PMH, social hx, family hx, and changes were documented in the history of present illness. Otherwise, unchanged from my initial visit note.  PE: BP 128/84 (BP Location: Left Arm, Patient Position: Sitting)   Pulse 73   Wt (!) 327 lb (148.3 kg)   SpO2 95%   BMI 61.79 kg/m  Body mass index is 61.79 kg/m. Wt Readings from Last 3 Encounters:  12/01/16 (!) 327 lb (148.3 kg)   11/04/16 (!) 325 lb (147.4 kg)  10/21/16 (!) 327 lb (148.3 kg)   Constitutional: overweight, in NAD Eyes: PERRLA, EOMI, no exophthalmos ENT: moist mucous membranes, no thyromegaly, no cervical lymphadenopathy Cardiovascular: RRR, No MRG, + LE swelling - R  Respiratory: CTA B Gastrointestinal: abdomen soft, NT, ND, BS+ Musculoskeletal: no deformities, strength intact in all 4 Skin: moist, warm, + stasis dermatitis Neurological: no tremor with outstretched hands, DTR normal in all 4   BMI Classification:  < 18.5 underweight   18.5-24.9 normal weight   25.0-29.9 overweight   30.0-34.9 class I obesity   35.0-39.9 class II obesity   ? 40.0 class III obesity   ASSESSMENT: 1. Hypothyroidism  2. PCOS  Testosterone was high >> (2015) indicating PCOS  Component     Latest Ref Rng 10/18/2013  Testosterone     10 - 70 ng/dL 78 (H)  Sex Hormone Binding     18 - 114 nmol/L 28  Testosterone Free     0.6 - 6.8 pg/mL 15.6 (H)  Testosterone-% Free     0.4 - 2.4 % 2.0  Androstenedione      93  DHEA-SO4     35 - 430 ug/dL 35    She has been tested for Cushing's sd. In 02/2012 by a Dexamethasone suppression test >> cortisol returned at 1.4 (normal). We also r/o Cushing's sd. 09/2013 by a normal 24h UFC: Component     Latest Ref Rng 10/25/2013  Cortisol (Ur), Free     4.0 - 50.0 mcg/24 h 26.4  Results received     0.63 - 2.50 g/24 h 1.83  Creatinine, Urine      59.9  Creatinine, 24H Ur     700 - 1800 mg/day 1798   She also had normal urine CA and MN (testing for 2nd HTN) >> no pheochromocytoma.  3. DM2   PLAN:  1. Patient with long-standing hypothyroidism, on levothyroxine therapy.  - latest thyroid labs reviewed with pt >> high - checked during her hospitalization - she continues on LT4 224 mcg daily - pt feels good on this dose. - we discussed about taking the thyroid hormone every day, with water, >30 minutes before breakfast, separated by >4 hours from acid  reflux medications, calcium, iron, multivitamins. Pt. is taking it correctly, every day. - will check thyroid tests today: TSH and fT4 - If labs are abnormal, she will need to return for repeat TFTs in 1.5 months - OTW, RTC in 3 mo  2. PCOS - on hormonal IUD - She was doing well on Spironolactone >> her body hair has decreased, but continued to have terminal hair on chin. She was happy with the results, however, we stayed at the lower dose, 25 mg twice a day due to  previous hyperkalemia (5.4). However, after her episode of acute kidney injury, she was advised to discontinue spironolactone. She is now taking it every other day, after she started it by herself. I advised her to stop, think she is now 51 years old and her PCOS symptoms will start improving.  3. DM2 - Reviewed her most recent HbA1c obtained in the hospital. This was higher than before. - She has been hospitalized and has been in the emergency room several times since I last saw her and has had prednisone. I suspect all of these contribute to her increased sugars. However, since her hospitalization she has been on glipizide, which initially caused her mild hypoglycemia, but now her sugars are fairly well-controlled after she decreased the dose of glipizide to just 10 mg once a day. I advised her to continue this, however, to move it to before breakfast. We also will continue metformin 1000 mg daily since she is tolerating it well, but will move it with dinner. Patient Instructions  Please continue: - Metformin 1000 mg daily but move this with dinner - Glipizide 10 mg but move this before b'fast  Please continue Levothyroxine 224 mcg daily.  Take the thyroid hormone every day, with water, at least 30 minutes before breakfast, separated by at least 4 hours from: - acid reflux medications - calcium - iron - multivitamins  Stop Spironolactone.  Please return in 3 months with your sugar log.   Component     Latest Ref Rng & Units  12/01/2016  TSH     0.35 - 4.50 uIU/mL 8.74 (H)  T4,Free(Direct)     0.60 - 1.60 ng/dL 0.86   TSH is still high, but improved.  She states complete compliance with her levothyroxine >> will go ahead and increase her dose to 262 (112 + 150) g daily and will recheck her tests when she comes back in 3 months.   Component     Latest Ref Rng & Units 12/01/2016  Fructosamine     190 - 270 umol/L 245   HbA1c calculated from the fructosamine is radically better, at 5.77%!  Philemon Kingdom, MD PhD [facility] Endocrinology

## 2016-12-01 NOTE — Patient Instructions (Addendum)
Please continue: - Metformin 1000 mg daily but move this with dinner - Glipizide 10 mg but move this before b'fast  Please continue Levothyroxine 224 mcg daily.  Take the thyroid hormone every day, with water, at least 30 minutes before breakfast, separated by at least 4 hours from: - acid reflux medications - calcium - iron - multivitamins  Stop Spironolactone.  Please return in 3 months with your sugar log.

## 2016-12-02 ENCOUNTER — Ambulatory Visit (INDEPENDENT_AMBULATORY_CARE_PROVIDER_SITE_OTHER): Payer: Medicare Other | Admitting: Internal Medicine

## 2016-12-02 ENCOUNTER — Encounter: Payer: Self-pay | Admitting: Internal Medicine

## 2016-12-02 VITALS — BP 124/64 | HR 77 | Ht 61.0 in | Wt 329.0 lb

## 2016-12-02 DIAGNOSIS — J9612 Chronic respiratory failure with hypercapnia: Secondary | ICD-10-CM | POA: Diagnosis not present

## 2016-12-02 DIAGNOSIS — J449 Chronic obstructive pulmonary disease, unspecified: Secondary | ICD-10-CM | POA: Diagnosis not present

## 2016-12-02 DIAGNOSIS — J9611 Chronic respiratory failure with hypoxia: Secondary | ICD-10-CM | POA: Diagnosis not present

## 2016-12-02 DIAGNOSIS — I10 Essential (primary) hypertension: Secondary | ICD-10-CM | POA: Diagnosis not present

## 2016-12-02 MED ORDER — ALBUTEROL SULFATE HFA 108 (90 BASE) MCG/ACT IN AERS: INHALATION_SPRAY | RESPIRATORY_TRACT | 1 refills | Status: DC

## 2016-12-02 MED ORDER — FLUTICASONE FUROATE-VILANTEROL 100-25 MCG/INH IN AEPB: 1.0000 | INHALATION_SPRAY | Freq: Every morning | RESPIRATORY_TRACT | 11 refills | Status: DC

## 2016-12-02 NOTE — Patient Instructions (Addendum)
Plan A = Automatic = Stop flovent and start BREO 100 one each am    Plan B = Backup Only use your albuterol as a rescue medication to be used if you can't catch your breath by resting or doing a relaxed purse lip breathing pattern.  - The less you use it, the better it will work when you need it. - Ok to use the inhaler up to 2 puffs  every 4 hours if you must but call for appointment if use goes up over your usual need - Don't leave home without it !!  (think of it like the spare tire for your car)   Plan C = Crisis - only use your albuterol nebulizer if you first try Plan B and it fails to help > ok to use the nebulizer up to every 4 hours but if start needing it regularly call for immediate appointment      If in any way you are not 100% satisfied,  please tell us.  If 100% better, tell your friends!  Pulmonary follow up is as needed

## 2016-12-02 NOTE — Progress Notes (Signed)
Subjective:     Patient ID: Autumn Massey, female   DOB: 24-May-1966    MRN: [id number]     Brief patient profile:  60 yowf  With morbid obesity Quit smoking 05/2015 on admit p several months worsening sob  > Lesage x 5 days > dx copd/ fluid overload and started on spiriva / pred/abx lowest wt 326 but  Started  worse again 1st of Jan 2017 with wt gain / sob s cough  So referred  07/18/2015 by Dr Jenny Reichmann to pulmonary clinic with no significiant airfow obstruction on initial eval so characterized as GOLD 0 stage / probable AB      History of Present Illness  07/18/2015 1st [facility] office visit/ Seve Monette  maint rx spiriva  Chief Complaint  Patient presents with  . Pulmonary Consult    Referred by Dr. [name]. Pt c/o SOB since Nov 2016. She states that she was dxed with PNA at this time and then told that she had COPD. She gets SOB walking across a parking lot or doing housework.    has 02 but only uses at hs 2lpm  X years  Presently doe =  MMRC3 = can't walk 100 yards even at a slow pace at a flat grade s stopping due to sob   Onset insidious/ pattern gradually worse assoc with leg swelling and wt loss but s excess/ purulent sputum or mucus plugs Breaths better p saba ? spiriva helping  rec Stop spiriva and start stiolto 2 puffs each am and fill the prescription if you like it, if not resume spiriva handihaler  Only use your albuterol   Stop normodyne  Start bystolic 10 mg daily in place of normodyne  Wear your 02 as much as you can    09/03/2015  f/u ov/Asami Lambright re: obesity/ maint rx = stiolto 2 each am / hbp  Chief Complaint  Patient presents with  . Follow-up    Using 2L O2 at bedtime and while doing a lot of walking and also during the day while at home. Mild wheezing first thing in the morning improves after using inhaler. Denies any coughing, cp/tightness. Pt was having some bladder leakage before but has noticed improvement since starting 02.  resting ok off 02 / sleeping on 02  and leaning on cart on 02 HT / can't do WM yet but trend is improving  rec Finish up your stiolto and then start  symbicort 80 Take 2 puffs first thing in am and then another 2 puffs about 12 hours later.  Only use your albuterol(proair)  as a rescue medication to be used if you can't catch your breath by resting or doing a relaxed purse lip breathing pattern.  - The less you use it, the better it will work when you need it.    10/05/2015 acute extended ov/Joylene Wescott re: sob  Chief Complaint  Patient presents with  . Acute Visit    SOB, fatigued, wheezing more than usual. Pt checked her O2 when walking today and checked her O2 levels-down to 76-had to put O2 concentrator on to help bring up to uppers 90's. Usually wears O2 QHS only. Pt lcan tell difference in breathing with Stilto-now on Symbicort since last OV instructions. Pt went back to   changed back to labetalol and symbicort about the same time and gradually worse ever since with increased need for saba due to sob assoc with subjective wheeze  s cough but comfortable at rest - no assoc increase  in chronic  leg swelling/ calf pain  rec Stop normodyne and symbicort Start bystolic 10 mg daily = bisoprolol 5 mg daily thru your plan  Start stiolto 2pffs each am  Work on maintaining perfect  inhaler technique:    10/09/2015 acute extended ov/Helayne Metsker re:  02 dep resp failure / ? Now hypercarbic  Chief Complaint  Patient presents with  . Acute Visit    Breathing progressively worse since 10/05/15 ov. She has also had some wheezing, chest tightness and non prod cough for the past 2 days.   on 02 at rest fine, but across the room sob and desat and now coughing also mostly dry  rec Please remember to go to the lab department downstairs for your tests - we will call you with the results when they are available.   Please see patient coordinator before you leave today  to schedule CTa Chest   Add: last bun > 30 so changed to v/q    12/02/2016  f/u ov/Yariana Hoaglund  re: flovent hfa 2 each am/  2nd 2 puffs on " if needed " Chief Complaint  Patient presents with  . Follow-up    Breathing is overall doing well. No new co's today. She rarely uses neb.    rare need for saba in any form now   Not limited by breathing from desired activities  But very sedentary due to wt   No obvious day to day or daytime variability or assoc excess/ purulent sputum or mucus plugs or hemoptysis or cp or chest tightness, subjective wheeze or overt sinus or hb symptoms. No unusual exp hx or h/o childhood pna/ asthma or knowledge of premature birth.  Sleeping ok without nocturnal  or early am exacerbation  of respiratory  c/o's or need for noct saba. Also denies any obvious fluctuation of symptoms with weather or environmental changes or other aggravating or alleviating factors except as outlined above   Current Medications, Allergies, Complete Past Medical History, Past Surgical History, Family History, and Social History were reviewed in [employer] record.  ROS  The following are not active complaints unless bolded sore throat, dysphagia, dental problems, itching, sneezing,  nasal congestion or excess/ purulent secretions, ear ache,   fever, chills, sweats, unintended wt loss, classically pleuritic or exertional cp,  orthopnea pnd or leg swelling, presyncope, palpitations, abdominal pain, anorexia, nausea, vomiting, diarrhea  or change in bowel or bladder habits, change in stools or urine, dysuria,hematuria,  rash, arthralgias, visual complaints, headache, numbness, weakness or ataxia or problems with walking or coordination,  change in mood/affect or memory.                   Objective:   Physical Exam   Massively obese wf nad could not get up on exam table  10/06/2015          353 > 10/09/2015   354 > ,12/02/2016   329  09/03/2015          353   07/18/15 349 lb 9.6 oz (158.578 kg)  05/31/15 332 lb (150.594 kg)  01/29/15 333 lb (151.048 kg)    Vital  signs reviewed - Note on arrival 02 sats  94% on RA      HEENT: nl dentition, turbinates, and oropharynx. Nl external ear canals without cough reflex   NECK :  without JVD/Nodes/TM/ nl carotid upstrokes bilaterally   LUNGS: no acc muscle use,  Nl contour chest with distant bs but completely clear to  A and P   CV:  RRR  no s3 or murmur or increase in P2, trace pitting  edema/ severe chronic venous stasis changes both LE's   ABD:  Massively obese with limited insp excursion  . No bruits or organomegaly, bowel sounds nl  MS:  Nl gait/ ext warm without deformities, calf tenderness, cyanosis or clubbing No obvious joint restrictions   SKIN: warm and dry without lesions    NEURO:  alert, approp, nl sensorium with  no motor deficits                      Assessment:        Outpatient Encounter Prescriptions as of 12/02/2016  Medication Sig  . acetaminophen (TYLENOL) 500 MG tablet Take 1,000 mg by mouth every 6 (six) hours as needed for mild pain, moderate pain, fever or headache.   . albuterol (PROVENTIL) (2.5 MG/3ML) 0.083% nebulizer solution Take 3 mLs (2.5 mg total) by nebulization every 6 (six) hours as needed for wheezing or shortness of breath.  [name] aspirin EC 81 MG tablet Take 81 mg by mouth daily.  . bisoprolol-hydrochlorothiazide (ZIAC) 5-6.25 MG tablet Take 1 tablet by mouth daily.  . cyclobenzaprine (FLEXERIL) 5 MG tablet Take 1 tablet (5 mg total) by mouth 3 (three) times daily as needed for muscle spasms.  . fluconazole (DIFLUCAN) 150 MG tablet 1 tab every 3 days as needed  . FLUoxetine (PROZAC) 20 MG capsule TAKE ONE CAPSULE BY MOUTH DAILY. [facility] appointment. FOR FUTURE REFILLS.  . furosemide (LASIX) 40 MG tablet Take 1 tablet (40 mg total) by mouth every other day as needed.  [name] GARLIC PO Take 1 tablet by mouth daily.  [name] glipiZIDE (GLUCOTROL) 10 MG tablet Take 10 mg by mouth daily before breakfast.  . irbesartan (AVAPRO) 75 MG tablet Take 1 tablet (75 mg total)  by mouth at bedtime.  . metFORMIN (GLUCOPHAGE-XR) 500 MG 24 hr tablet Take 1,000 mg by mouth daily with breakfast.   . montelukast (SINGULAIR) 10 MG tablet Take 1 tablet (10 mg total) by mouth at bedtime.  . OXYGEN 2lpm with sleep only  [facility]  . primidone (MYSOLINE) 50 MG tablet TAKE 2 AND 1/2 TABLETS BY MOUTH AT BEDTIME. Please schedule follow up visit  . simvastatin (ZOCOR) 40 MG tablet Take 1 tablet (40 mg total) by mouth at bedtime. Keep July appt for future refills  . [DISCONTINUED] fluticasone (FLOVENT HFA) 220 MCG/ACT inhaler Inhale 2 puffs into the lungs 2 (two) times daily.  . [DISCONTINUED] levothyroxine (SYNTHROID, LEVOTHROID) 112 MCG tablet Take 2 tablets (224 mcg total) by mouth daily before breakfast.  . albuterol (PROAIR HFA) 108 (90 Base) MCG/ACT inhaler 2 puffs every 4 hours as needed only  if your can't catch your breath  . fluticasone furoate-vilanterol (BREO ELLIPTA) 100-25 MCG/INH AEPB Inhale 1 puff into the lungs every morning.  . [DISCONTINUED] glipiZIDE (GLUCOTROL) 10 MG tablet Take 1 tablet (10 mg total) by mouth 2 (two) times daily before a meal. (Patient not taking: Reported on 12/02/2016)   No facility-administered encounter medications on file as of 12/02/2016.

## 2016-12-03 LAB — FRUCTOSAMINE: FRUCTOSAMINE: 245 umol/L (ref 190–270)

## 2016-12-03 MED ORDER — LEVOTHYROXINE SODIUM 150 MCG PO TABS: 150.0000 ug | ORAL_TABLET | Freq: Every day | ORAL | 3 refills | Status: DC

## 2016-12-03 MED ORDER — LEVOTHYROXINE SODIUM 112 MCG PO TABS: 112.0000 ug | ORAL_TABLET | Freq: Every day | ORAL | 3 refills | Status: DC

## 2016-12-03 NOTE — Assessment & Plan Note (Signed)
ERV 12% by pfts 09/03/2015   Body mass index is 62.16 kg/m.  -  trending down/ encouraged Lab Results  Component Value Date   TSH 8.74 (H) 12/01/2016     Contributing to gerd risk/ doe/reviewed the need and the process to achieve and maintain neg calorie balance > defer f/u primary care including intermittently monitoring thyroid status

## 2016-12-03 NOTE — Assessment & Plan Note (Addendum)
HCO3  07/23/15  = 30 c/w only mild hypercarbia  Up to 33 10/09/2015 02 2lpm hs and prn daytime  as of 12/02/2016   - HC03  11/04/16 = 34   Appears to be developing mild ohs s obvious osa but in absence of signs of am hypercarbia (ha/ ams) or daytime drowsiness main rx should be directed at losing wt

## 2016-12-03 NOTE — Assessment & Plan Note (Addendum)
Adequate control on present rx, reviewed in detail with pt > no change in rx needed  > would continue bisoprolol since it's the most beta selective generic available and she is prone to asthma and need for saba

## 2016-12-03 NOTE — Assessment & Plan Note (Addendum)
-   PFT's  05/22/10   FEV1 1.53 (62 % ) ratio 86  p no % improvement from saba with DLCO  87 % and ERV 40% at wt 209  - Quit smoking 05/2015 - 07/18/2015    try stiolto - PFT's s obst 09/03/2015 with ERV 12 p am stiolto so rec change to symbicort 80 2bid  - 10/05/2015 worse on symbicort while back on labetalol > resumed stiolto / bystolic   01/31/1516  After extensive coaching HFA effectiveness =    75% (short Ti)   No copd here so GOLD 0/ AB better fit:   However, flovent used "prn" is not an appropriate rx and instead rec breo 100 one each am and use the saba as back up    I had an extended discussion with the patient reviewing all relevant studies completed to date and  lasting 15 to 20 minutes of a 25 minute visit    Each maintenance medication was reviewed in detail including most importantly the difference between maintenance and prns and under what circumstances the prns are to be triggered using an action plan format that is not reflected in the computer generated alphabetically organized AVS.    Please see AVS for specific instructions unique to this visit that I personally wrote and verbalized to the the pt in detail and then reviewed with pt  by my nurse highlighting any  changes in therapy recommended at today's visit to their plan of care.    F/u can be prn

## 2016-12-08 ENCOUNTER — Encounter: Payer: Self-pay | Admitting: Family Medicine

## 2016-12-08 ENCOUNTER — Ambulatory Visit: Payer: Self-pay

## 2016-12-08 ENCOUNTER — Ambulatory Visit (INDEPENDENT_AMBULATORY_CARE_PROVIDER_SITE_OTHER)
Admission: RE | Admit: 2016-12-08 | Discharge: 2016-12-08 | Disposition: A | Discharge status: Home / Self Care | Payer: Medicare Other | Source: Ambulatory Visit | Attending: Family Medicine | Admitting: Family Medicine

## 2016-12-08 ENCOUNTER — Ambulatory Visit (INDEPENDENT_AMBULATORY_CARE_PROVIDER_SITE_OTHER): Payer: Medicare Other | Admitting: Family Medicine

## 2016-12-08 VITALS — BP 128/78 | HR 66 | Ht 61.0 in | Wt 328.0 lb

## 2016-12-08 DIAGNOSIS — M79672 Pain in left foot: Secondary | ICD-10-CM

## 2016-12-08 DIAGNOSIS — M84375A Stress fracture, left foot, initial encounter for fracture: Secondary | ICD-10-CM | POA: Diagnosis not present

## 2016-12-08 DIAGNOSIS — M7989 Other specified soft tissue disorders: Secondary | ICD-10-CM | POA: Diagnosis not present

## 2016-12-08 MED ORDER — POTASSIUM CHLORIDE CRYS ER 20 MEQ PO TBCR: 20.0000 meq | EXTENDED_RELEASE_TABLET | Freq: Every day | ORAL | 3 refills | Status: DC

## 2016-12-08 MED ORDER — VITAMIN D (ERGOCALCIFEROL) 1.25 MG (50000 UNIT) PO CAPS: 50000.0000 [IU] | ORAL_CAPSULE | ORAL | 0 refills | Status: DC

## 2016-12-08 NOTE — Assessment & Plan Note (Signed)
I believe the patient likely has more of a stress reaction of the left foot. We discussed that we do not see a cortical defect that x-rays are pending. We discussed him patient's body habitus plain a role. Patient also has significant amount of swelling of the lower extremities and patient will increase the Lasix. Patient understands the potential side effects with her history of hyponatremia and we'll monitor. Depending on how patient response as well as being in a [employer] we will see. Depending on if patient worsens we may need further evaluation for neurologic as well as vascular compromise. Patient was treated with plan and will come back again in 2 weeks.

## 2016-12-08 NOTE — Progress Notes (Signed)
[name] [address] [place], [place] [address] Phone: [phone] Subjective:   CC: left foot pain    [id number]  Autumn Massey is a 51 y.o. female coming in with complaint of Left knee pain. Patient states that the starting couple days ago. Does not remember any true initial injury. Patient had been doing more recently. Has had difficulty though with some of her medications and has decreased her water pill. States that the swelling more so his back to 40 mg daily of the Lasix. Patient states that that hasn't helped out. Now having even difficulty bearing any type of weight. States that it does start with some swelling. Has some different bluish discoloration from time to time     Past Medical History:  Diagnosis Date  . ANXIETY DEPRESSION 01/29/2009  . DEPRESSION 11/19/2007  . Diabetes ([place])   . FREQUENCY, URINARY 10/17/2009  . HYPERLIPIDEMIA 11/19/2007  . HYPERTENSION 11/19/2007  . HYPOTHYROIDISM 11/19/2007  . Morbid obesity ([place])   . PNEUMONIA 08/06/2010  . POLYCYTHEMIA 01/29/2009  . Preventative health care 04/06/2011  . Sleep apnea    mild sleep apnea, on o2 at 2l at nighttime  . SLEEP RELATED HYPOVENTILATION/HYPOXEMIA CCE 02/08/2009  . Venous insufficiency 07/19/2012   Past Surgical History:  Procedure Laterality Date  . TONSILLECTOMY    . ULNAR TUNNEL RELEASE Left 02/22/2013   Procedure: LEFT ULNAR NERVE DECOMPRESSION ;  Surgeon: Tennis Must, MD;  Location: [facility];  Service: Orthopedics;  Laterality: Left;   Social History   Social History  . Marital status: Divorced    Spouse name: N/A  . Number of children: 0  . Years of education: 12   Occupational History  . Disabled [place] History Main Topics  . Smoking status: Former Smoker    Packs/day: 0.50    Years: 22.00    Types: Cigarettes    Quit date: 05/22/2015  . Smokeless tobacco: Never Used  . Alcohol use 0.0 oz/week     Comment: rare    . Drug use: No  . Sexual activity: No   Other Topics Concern  . None   Social History Narrative  . None   No Known Allergies Family History  Problem Relation Age of Onset  . Cancer Mother        melanoma   . Cancer Maternal Grandfather        prostate cancer  . Alcohol abuse Other   . Arthritis Other   . Hypertension Other     Past medical history, social, surgical and family history all reviewed in electronic medical record.  No pertanent information unless stated regarding to the chief complaint.   Review of Systems:Review of systems updated and as accurate as of 12/08/16  No headache, visual changes, nausea, vomiting, diarrhea, constipation, dizziness, abdominal pain, skin rash, fevers, chills, night sweats, weight loss, swollen lymph nodes,  chest pain, shortness of breath, mood changes. Positive large coming swelling, joint swelling, body aches  Objective  Blood pressure 128/78, pulse 66, weight (!) 328 lb (148.8 kg). Systems examined below as of 12/08/16   General: No apparent distress alert and oriented x3 mood and affect normal, dressed appropriately.  Morbidly obese.  HEENT: Pupils equal, extraocular movements intact  Respiratory: Patient's speak in full sentences and does not appear short of breath  Cardiovascular: 2+ lower extremity edema with hemosiderin deposits, non tender, no erythema  Skin: Warm dry intact with no signs  of infection or rash on extremities or on axial skeleton.  Abdomen: Soft nontender  Neuro: Cranial nerves II through XII are intact, neurovascularly intact in all extremities with 2+ DTRs and 2+ pulses.  Lymph: No lymphadenopathy of posterior or anterior cervical chain or axillae bilaterally.  Gait in a wheelchair MSK:  Mild tender with full range of motion and good stability and symmetric strength and tone of shoulders, elbows, wrist, hip, knee and ankles bilaterally.   Left Foot exam shows the patient does have soft tissue swelling that  seems to be symmetric compared to contralateral sign. Severely tender to palpation of the fourth and fifth metatarsal  Limited muscular skeletal ultrasound was performed and interpreted by Lyndal Pulley  Limited ultrasound was performed and independently visualized by me shows the patient did not have a cortical defect but significant hypoechoic changes and increasing Doppler flow over the fourth and fifth metatarsals proximally. Patient's midfoot has severe arthritic changes. Impression: Patient's foot is arthritic changes of the potential stress fracture versus potential small cortical defect   Impression and Recommendations:     This case required medical decision making of moderate complexity.      Note: This dictation was prepared with Dragon dictation along with smaller phrase technology. Any transcriptional errors that result from this process are unintentional.

## 2016-12-08 NOTE — Assessment & Plan Note (Signed)
I believe the patient does have a stress fracture of the foot. The lower extremity swelling is also making more pain. We discussed icing regimen, home exercises. We discussed Cam Walker. Once weekly vitamin D. Encourage her to potentially lose weight. Patient will follow-up in 2-3 weeks. Make sure that we do see healing. This may take quite some time to heal secondary to comorbidities.

## 2016-12-08 NOTE — Patient Instructions (Addendum)
Good to see you  Get xray downstairs.  Wear boot daily buit not while sleeping, and come our of boot and move ankle Ice still is good.  Once weekly vitamin D for 12 weeks.  pennsaid pinkie amount topically 2 times daily as needed.  Increase water pill to 60mg  daily for 3 days and then back to 40mg   Giving you some potassium to have as well. Watch the sodium again.  See me again in 2 weeks.

## 2016-12-12 ENCOUNTER — Telehealth: Payer: Self-pay | Admitting: Internal Medicine

## 2016-12-12 MED ORDER — TRAMADOL HCL 50 MG PO TABS: 50.0000 mg | ORAL_TABLET | Freq: Three times a day (TID) | ORAL | 0 refills | Status: DC | PRN

## 2016-12-12 NOTE — Telephone Encounter (Signed)
faxed

## 2016-12-12 NOTE — Telephone Encounter (Signed)
Pt requested that this be sent to [facility] on [address]

## 2016-12-12 NOTE — Telephone Encounter (Signed)
Pt called stating that she saw Dr Tamala Julian on 12/08/2016 for a stress fracture in her left foot. He gave her a boot to wear and told her to apply Pennsaid twice a day. She has been following his advise but she has a constant throbbing pain (pt described it as a tooth ache in her foot), sometimes worse than others. This happens when she is laying down with it elevated, while she is sitting or walking. It is very painful when she walks even when she tries to stay off of the ball of her foot with the boot on. She has used Tylenol but has not given any major relief and tried Aspercreme but so far it has not helped. Is there anything you could recommend for her to do while Dr Tamala Julian is out for the afternoon? Please advise.   (Pt can be reached at: [phone])

## 2016-12-12 NOTE — Telephone Encounter (Signed)
Done

## 2016-12-12 NOTE — Telephone Encounter (Signed)
Ok for tramadol prn - Done hardcopy to [employer]

## 2016-12-18 ENCOUNTER — Other Ambulatory Visit: Payer: Self-pay | Admitting: Internal Medicine

## 2016-12-18 DIAGNOSIS — H5213 Myopia, bilateral: Secondary | ICD-10-CM | POA: Diagnosis not present

## 2016-12-18 NOTE — Telephone Encounter (Signed)
Dr. Jenny Reichmann please advise on 2nd medicaiton. Thanks!

## 2016-12-19 ENCOUNTER — Other Ambulatory Visit: Payer: Self-pay

## 2016-12-19 MED ORDER — MONTELUKAST SODIUM 10 MG PO TABS: 10.0000 mg | ORAL_TABLET | Freq: Every day | ORAL | 2 refills | Status: DC

## 2016-12-19 MED ORDER — METFORMIN HCL ER 500 MG PO TB24: 1000.0000 mg | ORAL_TABLET | Freq: Every day | ORAL | 1 refills | Status: DC

## 2016-12-19 MED ORDER — LEVOTHYROXINE SODIUM 112 MCG PO TABS: 112.0000 ug | ORAL_TABLET | Freq: Every day | ORAL | 3 refills | Status: DC

## 2016-12-22 ENCOUNTER — Other Ambulatory Visit: Payer: Self-pay

## 2016-12-22 MED ORDER — METFORMIN HCL ER 500 MG PO TB24: 1000.0000 mg | ORAL_TABLET | Freq: Every day | ORAL | 1 refills | Status: DC

## 2016-12-22 MED ORDER — LEVOTHYROXINE SODIUM 112 MCG PO TABS: 112.0000 ug | ORAL_TABLET | Freq: Every day | ORAL | 3 refills | Status: DC

## 2016-12-23 ENCOUNTER — Ambulatory Visit (INDEPENDENT_AMBULATORY_CARE_PROVIDER_SITE_OTHER): Payer: Medicare Other | Admitting: Family Medicine

## 2016-12-23 ENCOUNTER — Other Ambulatory Visit: Payer: Self-pay

## 2016-12-23 ENCOUNTER — Ambulatory Visit: Payer: Self-pay

## 2016-12-23 ENCOUNTER — Encounter: Payer: Self-pay | Admitting: Family Medicine

## 2016-12-23 VITALS — BP 132/88 | HR 74 | Ht 62.0 in | Wt 326.0 lb

## 2016-12-23 DIAGNOSIS — M84375G Stress fracture, left foot, subsequent encounter for fracture with delayed healing: Secondary | ICD-10-CM | POA: Diagnosis not present

## 2016-12-23 DIAGNOSIS — M79672 Pain in left foot: Secondary | ICD-10-CM | POA: Diagnosis not present

## 2016-12-23 DIAGNOSIS — M79604 Pain in right leg: Secondary | ICD-10-CM | POA: Diagnosis not present

## 2016-12-23 DIAGNOSIS — M79605 Pain in left leg: Secondary | ICD-10-CM | POA: Diagnosis not present

## 2016-12-23 MED ORDER — MONTELUKAST SODIUM 10 MG PO TABS: 10.0000 mg | ORAL_TABLET | Freq: Every day | ORAL | 2 refills | Status: DC

## 2016-12-23 MED ORDER — DULOXETINE HCL 20 MG PO CPEP: 20.0000 mg | ORAL_CAPSULE | Freq: Every day | ORAL | 1 refills | Status: DC

## 2016-12-23 NOTE — Progress Notes (Signed)
[name] [address] [place], [place] [address] Phone: [phone] Subjective:   CC: left foot pain f/u   [id number]  Autumn Massey is a 51 y.o. female coming in with complaint of Left foot pain. Patient seen previously and did have a fifth metatarsal fracture. Patient was put in a [employer]. Patient states that she is doing well in the [facility] and then after one week's and having worsening symptoms again. Patient states and unfortunately and will increasing swelling on the dorsal aspect of foot.     Past Medical History:  Diagnosis Date  . ANXIETY DEPRESSION 01/29/2009  . DEPRESSION 11/19/2007  . Diabetes ([place])   . FREQUENCY, URINARY 10/17/2009  . HYPERLIPIDEMIA 11/19/2007  . HYPERTENSION 11/19/2007  . HYPOTHYROIDISM 11/19/2007  . Morbid obesity ([place])   . PNEUMONIA 08/06/2010  . POLYCYTHEMIA 01/29/2009  . Preventative health care 04/06/2011  . Sleep apnea    mild sleep apnea, on o2 at 2l at nighttime  . SLEEP RELATED HYPOVENTILATION/HYPOXEMIA CCE 02/08/2009  . Venous insufficiency 07/19/2012   Past Surgical History:  Procedure Laterality Date  . TONSILLECTOMY    . ULNAR TUNNEL RELEASE Left 02/22/2013   Procedure: LEFT ULNAR NERVE DECOMPRESSION ;  Surgeon: Tennis Must, MD;  Location: [facility];  Service: Orthopedics;  Laterality: Left;   Social History   Social History  . Marital status: Divorced    Spouse name: N/A  . Number of children: 0  . Years of education: 12   Occupational History  . Disabled [place] History Main Topics  . Smoking status: Former Smoker    Packs/day: 0.50    Years: 22.00    Types: Cigarettes    Quit date: 05/22/2015  . Smokeless tobacco: Never Used  . Alcohol use 0.0 oz/week     Comment: rare  . Drug use: No  . Sexual activity: No   Other Topics Concern  . None   Social History Narrative  . None   No Known Allergies Family History  Problem Relation Age of Onset   . Cancer Mother        melanoma   . Cancer Maternal Grandfather        prostate cancer  . Alcohol abuse Other   . Arthritis Other   . Hypertension Other     Past medical history, social, surgical and family history all reviewed in electronic medical record.  No pertanent information unless stated regarding to the chief complaint.   Review of Systems: No headache, visual changes, nausea, vomiting, diarrhea, constipation, dizziness, abdominal pain, skin rash, fevers, chills, night sweats, weight loss, swollen lymph nodes, body aches,chest pain, shortness of breath, mood changes.  Positive muscle aches, joint swelling  Objective  Blood pressure 132/88, pulse 74, height 5\' 2"  (1.575 m), weight (!) 326 lb (147.9 kg).   Systems examined below as of 12/23/16 General: NAD A&O x3 mood, affect normal Morbidly obese HEENT: Pupils equal, extraocular movements intact no nystagmus Respiratory: not short of breath at rest or with speaking Cardiovascular: No lower extremity edema, non tender Skin: Warm dry intact with no signs of infection or rash on extremities or on axial skeleton. Abdomen: Soft nontender, no masses Neuro: Cranial nerves  intact, neurovascularly intact in all extremities with 2+ DTRs and 2+ pulses. Lymph: No lymphadenopathy appreciated today  Gait antalgic gait.  MSK: Non tender with full range of motion and good stability and symmetric strength and tone of  shoulders, elbows, wrist,  knee hips and ankles bilaterally.     Left Foot exam shows the patient does have soft tissue swelling severely tender. Patient is having new bruising noted over the dorsal aspect of the foot.   Limited muscular skeletal ultrasound was performed and interpreted by Lyndal Pulley  Limited ultrasound patient's foot shows the patient denies any new cortical defect of the fourth metatarsal in addition to the fifth metatarsal with delayed healing. Hypoechoic changes and severe increasing Doppler flow. No  masses appreciated. Impression: Left fourth and fifth metatarsals fractures Impression and Recommendations:     This case required medical decision making of moderate complexity.      Note: This dictation was prepared with Dragon dictation along with smaller phrase technology. Any transcriptional errors that result from this process are unintentional.

## 2016-12-23 NOTE — Assessment & Plan Note (Signed)
Patient did have more of a stress fracture noted. Now has a true cortical defect of the fourth metatarsal. Because the patient's size I think this is contribute into the nonhealing. Patient will continue the Cam Walker but will use a scooter at this point. We discussed icing regimen, pain medications given, patient will follow-up again in 4 weeks

## 2016-12-23 NOTE — Patient Instructions (Signed)
Good to see you  We will get the ultrasound to look at blood flow.  Continue the vitamin D  Cymbalta 20 mg daily  Kneeling scooter.  See me again in 3 weeks.

## 2016-12-30 ENCOUNTER — Ambulatory Visit: Payer: Medicare Other | Admitting: Neurology

## 2016-12-30 DIAGNOSIS — Z029 Encounter for administrative examinations, unspecified: Secondary | ICD-10-CM

## 2017-01-05 ENCOUNTER — Other Ambulatory Visit: Payer: Self-pay | Admitting: Internal Medicine

## 2017-01-06 ENCOUNTER — Ambulatory Visit (HOSPITAL_COMMUNITY)
Admission: RE | Admit: 2017-01-06 | Payer: Medicare Other | Source: Ambulatory Visit | Attending: Family Medicine | Admitting: Family Medicine

## 2017-01-13 ENCOUNTER — Encounter: Payer: Self-pay | Admitting: Neurology

## 2017-01-16 ENCOUNTER — Other Ambulatory Visit: Payer: Self-pay | Admitting: Internal Medicine

## 2017-01-22 ENCOUNTER — Ambulatory Visit (HOSPITAL_COMMUNITY)
Admission: RE | Admit: 2017-01-22 | Discharge: 2017-01-22 | Disposition: A | Discharge status: Home / Self Care | Payer: Medicare Other | Source: Ambulatory Visit | Attending: Cardiology | Admitting: Cardiology

## 2017-01-22 DIAGNOSIS — E785 Hyperlipidemia, unspecified: Secondary | ICD-10-CM | POA: Diagnosis not present

## 2017-01-22 DIAGNOSIS — I10 Essential (primary) hypertension: Secondary | ICD-10-CM | POA: Insufficient documentation

## 2017-01-22 DIAGNOSIS — E119 Type 2 diabetes mellitus without complications: Secondary | ICD-10-CM | POA: Insufficient documentation

## 2017-01-22 DIAGNOSIS — Z87891 Personal history of nicotine dependence: Secondary | ICD-10-CM | POA: Insufficient documentation

## 2017-01-22 DIAGNOSIS — Z8673 Personal history of transient ischemic attack (TIA), and cerebral infarction without residual deficits: Secondary | ICD-10-CM | POA: Diagnosis not present

## 2017-01-22 DIAGNOSIS — R0989 Other specified symptoms and signs involving the circulatory and respiratory systems: Secondary | ICD-10-CM | POA: Diagnosis not present

## 2017-01-22 DIAGNOSIS — M79604 Pain in right leg: Secondary | ICD-10-CM | POA: Insufficient documentation

## 2017-01-22 DIAGNOSIS — M79605 Pain in left leg: Secondary | ICD-10-CM | POA: Insufficient documentation

## 2017-02-02 ENCOUNTER — Ambulatory Visit: Payer: Medicare Other | Admitting: Neurology

## 2017-02-02 ENCOUNTER — Telehealth: Payer: Self-pay | Admitting: Neurology

## 2017-02-02 NOTE — Telephone Encounter (Signed)
Patient needs to talk to someone about medication she has appt with him on 02-27-17 due to [name] having to cancel appt on 02-02-17. She has some questions about lyrica and Primidone

## 2017-02-03 ENCOUNTER — Other Ambulatory Visit: Payer: Self-pay

## 2017-02-03 ENCOUNTER — Telehealth: Payer: Self-pay | Admitting: Family Medicine

## 2017-02-03 MED ORDER — PRIMIDONE 50 MG PO TABS: ORAL_TABLET | ORAL | 0 refills | Status: DC

## 2017-02-03 NOTE — Telephone Encounter (Signed)
LM on VM for Pt to call back regarding medication requestions

## 2017-02-03 NOTE — Telephone Encounter (Signed)
Called pt, unable to leave a msg.   

## 2017-02-03 NOTE — Telephone Encounter (Signed)
Ell her to stop it.  We will discuss at follow up

## 2017-02-03 NOTE — Telephone Encounter (Signed)
Pt states she was seen a couple weeks ago for a stress fracture, she states that is great, when she came in she said Dr. Tamala Julian noticed some nerve damage in her left foot, and prescribed DULoxetine (CYMBALTA) 20 MG capsule She states she cannot take this, it makes her crazy in the head and snap at people and be irritable. She would like to stop this medication and would like to try something else, she said one doctor prescribed her Lyrica before but she never filled it but she knows this is also for nerve damage. Please call back in regard

## 2017-02-03 NOTE — Telephone Encounter (Signed)
Spoke with Pt, we had to R/S her appt from 8/6 to 8/31-she has been w/o insurance, needs refill of primidone 50mg , rqst sent to [facility] [address]-advsd will give enough to last until her appt. Pt also asked for Lyrica, had recent stress fracture in her foot, saw ortho-advsd her to call ortho for Lyrica for foot pain

## 2017-02-05 ENCOUNTER — Other Ambulatory Visit: Payer: Self-pay

## 2017-02-05 MED ORDER — LEVOTHYROXINE SODIUM 150 MCG PO TABS: 150.0000 ug | ORAL_TABLET | Freq: Every day | ORAL | 3 refills | Status: DC

## 2017-02-05 MED ORDER — LEVOTHYROXINE SODIUM 112 MCG PO TABS: 112.0000 ug | ORAL_TABLET | Freq: Every day | ORAL | 3 refills | Status: DC

## 2017-02-10 ENCOUNTER — Other Ambulatory Visit: Payer: Self-pay

## 2017-02-10 MED ORDER — FUROSEMIDE 40 MG PO TABS: 40.0000 mg | ORAL_TABLET | ORAL | 3 refills | Status: DC | PRN

## 2017-02-10 MED ORDER — MONTELUKAST SODIUM 10 MG PO TABS: 10.0000 mg | ORAL_TABLET | Freq: Every day | ORAL | 2 refills | Status: DC

## 2017-02-10 MED ORDER — SIMVASTATIN 40 MG PO TABS: 40.0000 mg | ORAL_TABLET | Freq: Every day | ORAL | 0 refills | Status: DC

## 2017-02-10 MED ORDER — FLUOXETINE HCL 20 MG PO CAPS: ORAL_CAPSULE | ORAL | 3 refills | Status: DC

## 2017-02-10 NOTE — Telephone Encounter (Signed)
Done erx 

## 2017-02-17 DIAGNOSIS — N760 Acute vaginitis: Secondary | ICD-10-CM | POA: Diagnosis not present

## 2017-02-17 DIAGNOSIS — Z124 Encounter for screening for malignant neoplasm of cervix: Secondary | ICD-10-CM | POA: Diagnosis not present

## 2017-02-17 DIAGNOSIS — N390 Urinary tract infection, site not specified: Secondary | ICD-10-CM | POA: Diagnosis not present

## 2017-02-17 DIAGNOSIS — R319 Hematuria, unspecified: Secondary | ICD-10-CM | POA: Diagnosis not present

## 2017-02-24 ENCOUNTER — Telehealth: Payer: Self-pay

## 2017-02-24 NOTE — Telephone Encounter (Signed)
Med is not listed as currently prescribed  Please ask pt to ask Dr Cruzita Lederer if this should be refilled

## 2017-02-24 NOTE — Telephone Encounter (Signed)
Pharmacy is requesting refill of:  Januvia 100mg   Take 1 tablet (100mg ) by mouth daily.   However, I do not see this medication listed in her chart. Please advise.

## 2017-02-24 NOTE — Addendum Note (Signed)
Addended by: Biagio Borg on: 02/24/2017 01:13 PM   Modules accepted: Orders

## 2017-02-27 ENCOUNTER — Other Ambulatory Visit: Payer: Self-pay | Admitting: Family Medicine

## 2017-02-27 NOTE — Telephone Encounter (Signed)
Refill done.  

## 2017-03-03 ENCOUNTER — Other Ambulatory Visit: Payer: Medicare Other

## 2017-03-03 ENCOUNTER — Encounter: Payer: Self-pay | Admitting: Neurology

## 2017-03-03 ENCOUNTER — Ambulatory Visit (INDEPENDENT_AMBULATORY_CARE_PROVIDER_SITE_OTHER): Payer: Medicare Other | Admitting: Neurology

## 2017-03-03 VITALS — BP 110/68 | HR 86 | Ht 62.0 in | Wt 330.2 lb

## 2017-03-03 DIAGNOSIS — G629 Polyneuropathy, unspecified: Secondary | ICD-10-CM

## 2017-03-03 DIAGNOSIS — G250 Essential tremor: Secondary | ICD-10-CM | POA: Diagnosis not present

## 2017-03-03 MED ORDER — PRIMIDONE 50 MG PO TABS: 150.0000 mg | ORAL_TABLET | Freq: Every day | ORAL | 5 refills | Status: DC

## 2017-03-03 NOTE — Progress Notes (Signed)
NEUROLOGY FOLLOW UP OFFICE NOTE  Autumn Massey [id number]  HISTORY OF PRESENT ILLNESS: Autumn Massey is a 51 year old right-handed woman with hypothyroidism, hyperlipidemia, impaired glucose tolerance, hypertension, hypoventilation, cerebrovascular disease and former smoker who follows up for essential tremor.   UPDATE: She was doing well on primidone 150mg  at bedtime.  She lost her insurance for a short while and stopped primidone in June.  She noticed the tremor got worse and restarted it lately with improvement.  She is currently taking 100mg  at bedtime.  She has numbness and tingling in the toes and fingers.  She also notes electric pain in the right leg below the knee to the foot.   For the past few months, she reports increased short term memory problems.  It may take a few seconds to recall a name, word-finding difficulty or remembering what she needed to tell somebody.  She is independent to all simple and complex ADLs, including paying bills and driving.    HISTORY: She underwent left ulnar decompression surgery in August 2014.  When the cast was removed the following month, she noted twitching of her 5th digit.[name]  An MRI of the cervical spine was performed in December 2014, which revealed no structural etiology for radiculopathy.  She was evaluated by Dr. Catalina Gravel, a neurologist in [place], who performed a [place] in January 2015.  It was essentially a normal exam.  She continues to have numbness, paresthesias and weakness of the 4th and 5th digits of the left hand.  She later developed tremor in both hands, more noticeable in the right hand since she is right-hand dominant.  It occurs when she is writing or holding utensils.  She denies gait difficulty, rigidity, lack of sense of smell, or history consistent with REM sleep behavior.  She is concerned about Parkinson's disease because her maternal grandfather and uncle had it.  Another uncle has tremor.    PAST MEDICAL HISTORY: Past  Medical History:  Diagnosis Date  . ANXIETY DEPRESSION 01/29/2009  . DEPRESSION 11/19/2007  . Diabetes ([place])   . FREQUENCY, URINARY 10/17/2009  . HYPERLIPIDEMIA 11/19/2007  . HYPERTENSION 11/19/2007  . HYPOTHYROIDISM 11/19/2007  . Morbid obesity ([place])   . PNEUMONIA 08/06/2010  . POLYCYTHEMIA 01/29/2009  . Preventative health care 04/06/2011  . Sleep apnea    mild sleep apnea, on o2 at 2l at nighttime  . SLEEP RELATED HYPOVENTILATION/HYPOXEMIA CCE 02/08/2009  . Venous insufficiency 07/19/2012    MEDICATIONS: Current Outpatient Prescriptions on File Prior to Visit  Medication Sig Dispense Refill  . acetaminophen (TYLENOL) 500 MG tablet Take 1,000 mg by mouth every 6 (six) hours as needed for mild pain, moderate pain, fever or headache.     . albuterol (PROAIR HFA) 108 (90 Base) MCG/ACT inhaler 2 puffs every 4 hours as needed only  if your can't catch your breath 1 Inhaler 1  . albuterol (PROVENTIL) (2.5 MG/3ML) 0.083% nebulizer solution Take 3 mLs (2.5 mg total) by nebulization every 6 (six) hours as needed for wheezing or shortness of breath. 75 mL 0  . aspirin EC 81 MG tablet Take 81 mg by mouth daily.    . bisoprolol-hydrochlorothiazide (ZIAC) 5-6.25 MG tablet Take 1 tablet by mouth daily. 90 tablet 3  . cyclobenzaprine (FLEXERIL) 5 MG tablet TAKE ONE TABLET BY MOUTH THREE TIMES DAILY AS NEEDED FOR muscle spasm 40 tablet 5  . fluconazole (DIFLUCAN) 150 MG tablet 1 tab every 3 days as needed 2 tablet 1  . FLUoxetine (PROZAC) 20  MG capsule TAKE 1 CAPSULE BY MOUTH ONCE DAILY 90 capsule 3  . fluticasone furoate-vilanterol (BREO ELLIPTA) 100-25 MCG/INH AEPB Inhale 1 puff into the lungs every morning. 28 each 11  . furosemide (LASIX) 40 MG tablet Take 1 tablet (40 mg total) by mouth every other day as needed. 45 tablet 3  . GARLIC PO Take 1 tablet by mouth daily.    [name] glipiZIDE (GLUCOTROL) 10 MG tablet Take 10 mg by mouth daily before breakfast.    . irbesartan (AVAPRO) 75 MG tablet Take 1 tablet (75  mg total) by mouth at bedtime. 30 tablet 0  . levothyroxine (SYNTHROID, LEVOTHROID) 112 MCG tablet Take 1 tablet (112 mcg total) by mouth daily before breakfast. Take along with 150 mcg. 90 tablet 3  . levothyroxine (SYNTHROID, LEVOTHROID) 150 MCG tablet Take 1 tablet (150 mcg total) by mouth daily. Take along with the 112 mcg tablet for a total of 262 mcg daily. 90 tablet 3  . metFORMIN (GLUCOPHAGE-XR) 500 MG 24 hr tablet Take 2 tablets (1,000 mg total) by mouth daily with breakfast. 360 tablet 1  . montelukast (SINGULAIR) 10 MG tablet Take 1 tablet (10 mg total) by mouth at bedtime. 30 tablet 2  . OXYGEN 2lpm with sleep only  [facility]    . potassium chloride SA (K-DUR,KLOR-CON) 20 MEQ tablet Take 1 tablet (20 mEq total) by mouth daily. 30 tablet 3  . simvastatin (ZOCOR) 40 MG tablet TAKE 1 TABLET BY MOUTH AT BEDTIME 90 tablet 0  . Vitamin D, Ergocalciferol, (DRISDOL) 50000 units CAPS capsule TAKE 1 CAPSULE (50,000 UNITS TOTAL) BY MOUTH EVERY 7 (SEVEN) DAYS. 12 capsule 0   No current facility-administered medications on file prior to visit.     ALLERGIES: Allergies  Allergen Reactions  . Cymbalta [Duloxetine Hcl]     FAMILY HISTORY: Family History  Problem Relation Age of Onset  . Cancer Mother        melanoma   . Cancer Maternal Grandfather        prostate cancer  . Alcohol abuse Other   . Arthritis Other   . Hypertension Other     SOCIAL HISTORY: Social History   Social History  . Marital status: Divorced    Spouse name: N/A  . Number of children: 0  . Years of education: 12   Occupational History  . Disabled [place] History Main Topics  . Smoking status: Former Smoker    Packs/day: 0.50    Years: 22.00    Types: Cigarettes    Quit date: 05/22/2015  . Smokeless tobacco: Never Used  . Alcohol use 0.0 oz/week     Comment: rare  . Drug use: No  . Sexual activity: No   Other Topics Concern  . Not on file   Social History Narrative  . No  narrative on file    REVIEW OF SYSTEMS: Constitutional: No fevers, chills, or sweats, no generalized fatigue, change in appetite Eyes: No visual changes, double vision, eye pain Ear, nose and throat: No hearing loss, ear pain, nasal congestion, sore throat Cardiovascular: No chest pain, palpitations Respiratory:  No shortness of breath at rest or with exertion, wheezes GastrointestinaI: No nausea, vomiting, diarrhea, abdominal pain, fecal incontinence Genitourinary:  No dysuria, urinary retention or frequency Musculoskeletal:  No neck pain, back pain Integumentary: No rash, pruritus, skin lesions Neurological: as above Psychiatric: No depression, insomnia, anxiety Endocrine: No palpitations, fatigue, diaphoresis, mood swings, change in appetite, change in weight, increased thirst  Hematologic/Lymphatic:  No purpura, petechiae. Allergic/Immunologic: no itchy/runny eyes, nasal congestion, recent allergic reactions, rashes  PHYSICAL EXAM: Vitals:   03/03/17 1419  BP: 110/68  Pulse: 86  SpO2: (!) 89%   General: No acute distress.  Patient appears well-groomed.  Morbidly obese body habitus. Head:  Normocephalic/atraumatic Eyes:  Fundi examined but not visualized Neck: supple, no paraspinal tenderness, full range of motion Heart:  Regular rate and rhythm Lungs:  Clear to auscultation bilaterally Back: No paraspinal tenderness Neurological Exam: alert and oriented to person, place, and time. Attention span and concentration intact, recent and remote memory intact, fund of knowledge intact.  Speech fluent and not dysarthric, language intact.  CN II-XII intact. Bulk and tone normal, muscle strength 5/5 throughout.  Sensation to pinprick reduced in feet up to shins.  Deep tendon reflexes absent throughout, toes downgoing.  Finger to nose testing with slight intention tremor.  Wide-based gait.  IMPRESSION: Essential tremor Memory deficts.  I don't appreciate any evidence of cognitive  impairment at this time. Neuropathy  PLAN: 1.  She will increase primidone to 150mg  at bedtime 2.  Once we know she is tolerating primidone 150mg , she will contact me in one week and we can start a medication for neuropathic pain (low dose gabapentin or Lyrica). 3.  Weight loss 4.  Monitor memory 5.  Follow up in 6 months.  25 minutes spent face to face with patient, over 50% spent discussing management.  Metta Clines, DO  CC:  [name], MD

## 2017-03-03 NOTE — Patient Instructions (Signed)
1.  Increase primidone to 150mg  (3 tablets) at bedtime 2.  Contact me in one week and we can start something for nerve pain (gabapentin or Lyrica) 3.  Follow up in 6 months.

## 2017-03-04 ENCOUNTER — Telehealth: Payer: Self-pay

## 2017-03-04 LAB — VITAMIN B12: VITAMIN B 12: 432 pg/mL (ref 200–1100)

## 2017-03-04 NOTE — Telephone Encounter (Signed)
-----   Message from Pieter Partridge, DO sent at 03/04/2017  7:59 AM EDT ----- B12 is normal

## 2017-03-04 NOTE — Telephone Encounter (Signed)
LM on VM advising B12 level is normal

## 2017-03-10 ENCOUNTER — Other Ambulatory Visit (INDEPENDENT_AMBULATORY_CARE_PROVIDER_SITE_OTHER): Payer: Medicare Other

## 2017-03-10 ENCOUNTER — Encounter: Payer: Self-pay | Admitting: Internal Medicine

## 2017-03-10 ENCOUNTER — Ambulatory Visit (INDEPENDENT_AMBULATORY_CARE_PROVIDER_SITE_OTHER): Payer: Medicare Other | Admitting: Internal Medicine

## 2017-03-10 VITALS — BP 132/82 | HR 74 | Ht 62.0 in | Wt 332.0 lb

## 2017-03-10 DIAGNOSIS — Z23 Encounter for immunization: Secondary | ICD-10-CM | POA: Diagnosis not present

## 2017-03-10 DIAGNOSIS — E1165 Type 2 diabetes mellitus with hyperglycemia: Secondary | ICD-10-CM

## 2017-03-10 DIAGNOSIS — E039 Hypothyroidism, unspecified: Secondary | ICD-10-CM

## 2017-03-10 LAB — T4, FREE: FREE T4: 1.02 ng/dL (ref 0.60–1.60)

## 2017-03-10 LAB — POTASSIUM: Potassium: 4.2 mEq/L (ref 3.5–5.1)

## 2017-03-10 LAB — TSH: TSH: 5.3 u[IU]/mL — ABNORMAL HIGH (ref 0.35–4.50)

## 2017-03-10 MED ORDER — EMPAGLIFLOZIN 10 MG PO TABS: 10.0000 mg | ORAL_TABLET | Freq: Every day | ORAL | 11 refills | Status: DC

## 2017-03-10 NOTE — Patient Instructions (Addendum)
Please continue: - Metformin 1000 mg daily but move it with dinner - Glipizide 10 mg before b'fast (may need to decrease the dose to 5 mg daily).  Please add: - Jardiance 10 mg before b'fast  Please continue Levothyroxine 262 (112 + 150) g daily  Take the thyroid hormone every day, with water, at least 30 minutes before breakfast, separated by at least 4 hours from: - acid reflux medications - calcium - iron - multivitamins  Please stop at the lab.  Please return in 1.5 months with your sugar log.

## 2017-03-10 NOTE — Progress Notes (Signed)
Patient ID: Autumn Massey, female   DOB: 05/22/1966, 51 y.o.   MRN: [id number]   HPI  Autumn Massey is a 51 y.o.-year-old female, returning for f/u for DM2, hypothyroidism, and PCOS. Last visit 3 mo ago. On disability now.  PCOS: Reviewed and addended  hx:  - weight gain: Maximal weight 364  - was on Phentermine, which worked initially, then stopped working  - Would consider gastric bypass but only as a last resort - + hirsutism >> started to see facial hair in her 30s - + hair loss >> female pattern, and bald spot in vertex - no acne - had a Mirena IUD placed 04/2013 for heavy menstrual cycles >> out now - has frequent Prednisone tapers   We started Spironolactone >> 50 mg bid, but potassium increased >> we had to decrease to 25 mg bid, but stopped at last visit.   Latest potassium: Lab Results  Component Value Date   K 4.4 11/04/2016   Hypothyroidism: Pt. has been dx with hypothyroidism in 2005.  Pt is on levothyroxine 262 mcg daily (increased at last visit), taken: - in am - fasting - at least 30 min from b'fast - no Ca, Fe, MVI, PPIs - not on Biotin  I reviewed pt's thyroid tests: Lab Results  Component Value Date   TSH 8.74 (H) 12/01/2016   TSH 14.632 (H) 10/15/2016   TSH 9.96 (H) 04/22/2016   TSH 2.25 07/23/2015   TSH 1.56 11/22/2014   TSH 1.692 05/24/2014   TSH 0.33 (L) 03/08/2014   TSH 0.33 (L) 01/27/2014   TSH 1.91 01/10/2014   TSH 4.88 (H) 11/29/2013   FREET4 0.86 12/01/2016   FREET4 1.05 10/15/2016   FREET4 0.77 04/22/2016   FREET4 1.08 07/23/2015   FREET4 0.91 11/22/2014   FREET4 1.21 05/24/2014   FREET4 1.05 03/08/2014   FREET4 1.38 01/27/2014   FREET4 1.16 01/10/2014   FREET4 0.86 11/29/2013    Pt denies: - feeling nodules in neck - hoarseness - dysphagia - choking - SOB with lying down  She is on neurontin 3x a day (for tremors) but had to decrease to 2x a day today b/c somnolence.  She also tried Primidone >> restarted by Dr. Tomi Likens. She  will also start Lyrica. She tried Neurontin >> somnolence.  DM2:  Reviewed HbA1c: 11/2016: HbA1c calculated from the fructosamine is radically better, at 5.77%! Lab Results  Component Value Date   HGBA1C 9.5 (H) 09/29/2016   HGBA1C 9.3 (H) 09/28/2016   HGBA1C 8.6 09/16/2016   On: - Metformin ER 1000 mg in am >> at bedtime (not with dinner, as advised) - Glipizide 10 mg - decreased to once a day in am b/c low CBGs  Not taking Januvia >> not covered.  She checks sugars 1-2x a day: - am: 90-110 >> 97-121 >> 120-140s >> 120-140 >> 130-132 - lunch: n/c - after lunch: 143-171 >> 147-187 (Prednisone) >> n/c >> 145-160, 177 >> 135-147, 152 - after dinner: 140s-180 >> 160-170 >> 150-160s >> 160-170, 212 (fast food)  She quit smoking in 05/2015.  ROS: Constitutional: no weight gain/no weight loss, no fatigue, no subjective hyperthermia, no subjective hypothermia Eyes: no blurry vision, no xerophthalmia ENT: no sore throat, no nodules palpated in throat, no dysphagia, no odynophagia, no hoarseness Cardiovascular: no CP/+ SOB/no palpitations/no leg swelling Respiratory: no cough/+ SOB/no wheezing Gastrointestinal: no N/no V/no D/no C/no acid reflux Musculoskeletal: + muscle aches/+ joint aches Skin: no rashes, no hair loss Neurological: no tremors/no numbness/no  tingling/no dizziness  I reviewed pt's medications, allergies, PMH, social hx, family hx, and changes were documented in the history of present illness. Otherwise, unchanged from my initial visit note.  PE: BP 132/82 (BP Location: Left Arm, Patient Position: Sitting)   Pulse 74   Ht 5\' 2"  (1.575 m)   Wt (!) 332 lb (150.6 kg)   LMP  (LMP Unknown) Comment: Just had IUD taken out 3 weeks ago.  SpO2 93%   BMI 60.72 kg/m  Body mass index is 60.72 kg/m. Wt Readings from Last 3 Encounters:  03/10/17 (!) 332 lb (150.6 kg)  03/03/17 (!) 330 lb 3.2 oz (149.8 kg)  12/23/16 (!) 326 lb (147.9 kg)   Constitutional: overweight, in  NAD Eyes: PERRLA, EOMI, no exophthalmos ENT: moist mucous membranes, no thyromegaly, no cervical lymphadenopathy Cardiovascular: RRR, No MRG Respiratory: CTA B Gastrointestinal: abdomen soft, NT, ND, BS+ Musculoskeletal: no deformities, strength intact in all 4 Skin: moist, warm, no rashes exc. Stasis dermatitis Neurological: no tremor with outstretched hands, DTR normal in all 4  ASSESSMENT: 1. Hypothyroidism  2. PCOS  Testosterone was high >> (2015) indicating PCOS  Component     Latest Ref Rng 10/18/2013  Testosterone     10 - 70 ng/dL 78 (H)  Sex Hormone Binding     18 - 114 nmol/L 28  Testosterone Free     0.6 - 6.8 pg/mL 15.6 (H)  Testosterone-% Free     0.4 - 2.4 % 2.0  Androstenedione      93  DHEA-SO4     35 - 430 ug/dL 35    She has been tested for Cushing's sd. In 02/2012 by a Dexamethasone suppression test >> cortisol returned at 1.4 (normal). We also r/o Cushing's sd. 09/2013 by a normal 24h UFC: Component     Latest Ref Rng 10/25/2013  Cortisol (Ur), Free     4.0 - 50.0 mcg/24 h 26.4  Results received     0.63 - 2.50 g/24 h 1.83  Creatinine, Urine      59.9  Creatinine, 24H Ur     700 - 1800 mg/day 1798   She also had normal urine CA and MN (testing for 2nd HTN) >> no pheochromocytoma.  3. DM2   PLAN:  1. Patient with long-standing hypothyroidism, on levothyroxine therapy.  - latest thyroid labs reviewed with pt >> high  - dose increased - she continues on LT4 262 mcg daily - pt feels good on this dose. - we discussed about taking the thyroid hormone every day, with water, >30 minutes before breakfast, separated by >4 hours from acid reflux medications, calcium, iron, multivitamins. Pt. is taking it correctly. - will check thyroid tests today: TSH and fT4 - If labs are abnormal, she will need to return for repeat TFTs in 1.5 months - OTW, Return in about 6 weeks (around 04/21/2017).  2. PCOS - off hormonal IUD now, as she reached ave. Age of  menopause - at last visit, we stopped Spironolactone  3. DM2 - sugars above goal >> will try to add Jardiance low dose >> will check a potassium now and at next visit - if sugars start dropping too much >> advised her to decrease Glipizide further Patient Instructions  Please continue: - Metformin 1000 mg daily but move it with dinner - Glipizide 10 mg before b'fast (may need to decrease the dose to 5 mg daily).  Please add: - Jardiance 10 mg before b'fast  Please continue Levothyroxine 262 (  112 + 150) g daily  Take the thyroid hormone every day, with water, at least 30 minutes before breakfast, separated by at least 4 hours from: - acid reflux medications - calcium - iron - multivitamins  Please stop at the lab.  Please return in 1.5 months with your sugar log.   Component     Latest Ref Rng & Units 03/10/2017          Potassium     3.5 - 5.1 mEq/L 4.2  TSH     0.35 - 4.50 uIU/mL 5.30 (H)  T4,Free(Direct)     0.60 - 1.60 ng/dL 1.02  Fructosamine     190 - 270 umol/L 268   HbA1c calculated from Fructosamine is 6.17% (great!) TSH is still slightly high, but definitely better and would like to recheck this at next visit before changing the LT4 dose. Potassium level is normal.  Philemon Kingdom, MD PhD [facility] Endocrinology

## 2017-03-12 LAB — FRUCTOSAMINE: FRUCTOSAMINE: 268 umol/L (ref 190–270)

## 2017-03-20 ENCOUNTER — Other Ambulatory Visit: Payer: Self-pay | Admitting: Internal Medicine

## 2017-04-02 ENCOUNTER — Encounter: Payer: Self-pay | Admitting: Neurology

## 2017-04-03 ENCOUNTER — Other Ambulatory Visit: Payer: Self-pay

## 2017-04-12 ENCOUNTER — Other Ambulatory Visit: Payer: Self-pay | Admitting: Internal Medicine

## 2017-04-15 ENCOUNTER — Ambulatory Visit: Payer: Medicare Other | Admitting: Internal Medicine

## 2017-04-16 ENCOUNTER — Encounter: Payer: Self-pay | Admitting: Internal Medicine

## 2017-04-16 ENCOUNTER — Ambulatory Visit (INDEPENDENT_AMBULATORY_CARE_PROVIDER_SITE_OTHER): Payer: Medicare Other | Admitting: Internal Medicine

## 2017-04-16 VITALS — BP 146/88 | HR 84 | Ht 62.0 in | Wt 327.0 lb

## 2017-04-16 DIAGNOSIS — J45901 Unspecified asthma with (acute) exacerbation: Secondary | ICD-10-CM | POA: Diagnosis not present

## 2017-04-16 DIAGNOSIS — E1165 Type 2 diabetes mellitus with hyperglycemia: Secondary | ICD-10-CM | POA: Diagnosis not present

## 2017-04-16 DIAGNOSIS — J441 Chronic obstructive pulmonary disease with (acute) exacerbation: Secondary | ICD-10-CM | POA: Diagnosis not present

## 2017-04-16 DIAGNOSIS — I10 Essential (primary) hypertension: Secondary | ICD-10-CM

## 2017-04-16 DIAGNOSIS — R059 Cough, unspecified: Secondary | ICD-10-CM

## 2017-04-16 DIAGNOSIS — R05 Cough: Secondary | ICD-10-CM | POA: Diagnosis not present

## 2017-04-16 MED ORDER — ALBUTEROL SULFATE (2.5 MG/3ML) 0.083% IN NEBU: 2.5000 mg | INHALATION_SOLUTION | Freq: Four times a day (QID) | RESPIRATORY_TRACT | 0 refills | Status: DC | PRN

## 2017-04-16 MED ORDER — LEVOFLOXACIN 500 MG PO TABS: 500.0000 mg | ORAL_TABLET | Freq: Every day | ORAL | 0 refills | Status: AC

## 2017-04-16 MED ORDER — ALBUTEROL SULFATE HFA 108 (90 BASE) MCG/ACT IN AERS: INHALATION_SPRAY | RESPIRATORY_TRACT | 5 refills | Status: DC

## 2017-04-16 MED ORDER — PREDNISONE 10 MG PO TABS: ORAL_TABLET | ORAL | 0 refills | Status: DC

## 2017-04-16 MED ORDER — METHYLPREDNISOLONE ACETATE 80 MG/ML IJ SUSP
80.0000 mg | Freq: Once | INTRAMUSCULAR | Status: AC
  Administered 2017-04-16: 80 mg via INTRAMUSCULAR

## 2017-04-16 MED ORDER — HYDROCODONE-HOMATROPINE 5-1.5 MG/5ML PO SYRP: 5.0000 mL | ORAL_SOLUTION | Freq: Four times a day (QID) | ORAL | 0 refills | Status: AC | PRN

## 2017-04-16 NOTE — Patient Instructions (Signed)
You had the steroid shot today  Please take all new medication as prescribed - the antibiotic, cough medicine, and prednisone  Please continue all other medications as before, and refills have been done if requested - the inhaler and nebulizer medications  Please have the pharmacy call with any other refills you may need.  Please keep your appointments with your specialists as you may have planned  Please remember to call about scheduling your mammogram  Please return in 6 months, or sooner if needed, with Lab testing done 3-5 days before

## 2017-04-16 NOTE — Assessment & Plan Note (Signed)
BP Readings from Last 3 Encounters:  04/16/17 (!) 146/88  03/10/17 132/82  03/03/17 110/68  mild elevated today likely situational,  to f/u any worsening symptoms or concerns

## 2017-04-16 NOTE — Assessment & Plan Note (Signed)
stable overall by history and exam,  and pt to continue medical treatment as before,  to f/u any worsening symptoms or concerns, pt to call for cbg > 200 with tx and illness

## 2017-04-16 NOTE — Assessment & Plan Note (Addendum)
Mild to mod, c/w bronchitis vs pna, declines cxr, for antibx course,  Cough med prn, to f/u any worsening symptoms or concerns 

## 2017-04-16 NOTE — Assessment & Plan Note (Signed)
Mild to mod, for depomedrol IM 80, predpac asd, to f/u any worsening symptoms or concerns 

## 2017-04-16 NOTE — Progress Notes (Signed)
Subjective:    Patient ID: Autumn Massey, female    DOB: 10/05/1965, 51 y.o.   MRN: [id number]  HPI  Here with acute onset mild to mod 2-3 days ST, HA, general weakness and malaise, with prod cough greenish sputum, but Pt denies chest pain, increased sob or doe, wheezing, orthopnea, PND, increased LE swelling, palpitations, dizziness or syncope, except for onset mild wheezing and sob since last PM.  Cough was particularly bad last PM and unable to sleep.  Has increased her prn neb from every few days prn to q4-6 hours last 2 days. Not better with mucinex and robitussin. Pt denies new neurological symptoms such as new headache, or facial or extremity weakness or numbness   Pt denies polydipsia, polyuria, cbg's recently in upper 100;s Past Medical History:  Diagnosis Date  . ANXIETY DEPRESSION 01/29/2009  . DEPRESSION 11/19/2007  . Diabetes ([place])   . FREQUENCY, URINARY 10/17/2009  . HYPERLIPIDEMIA 11/19/2007  . HYPERTENSION 11/19/2007  . HYPOTHYROIDISM 11/19/2007  . Morbid obesity ([place])   . PNEUMONIA 08/06/2010  . POLYCYTHEMIA 01/29/2009  . Preventative health care 04/06/2011  . Sleep apnea    mild sleep apnea, on o2 at 2l at nighttime  . SLEEP RELATED HYPOVENTILATION/HYPOXEMIA CCE 02/08/2009  . Venous insufficiency 07/19/2012   Past Surgical History:  Procedure Laterality Date  . TONSILLECTOMY    . ULNAR TUNNEL RELEASE Left 02/22/2013   Procedure: LEFT ULNAR NERVE DECOMPRESSION ;  Surgeon: Tennis Must, MD;  Location: [facility];  Service: Orthopedics;  Laterality: Left;    reports that she quit smoking about 22 months ago. Her smoking use included Cigarettes. She has a 11.00 pack-year smoking history. She has never used smokeless tobacco. She reports that she drinks alcohol. She reports that she does not use drugs. family history includes Alcohol abuse in her other; Arthritis in her other; Cancer in her maternal grandfather and mother; Hypertension in her other. Allergies    Allergen Reactions  . Cymbalta [Duloxetine Hcl]    Current Outpatient Prescriptions on File Prior to Visit  Medication Sig Dispense Refill  . acetaminophen (TYLENOL) 500 MG tablet Take 1,000 mg by mouth every 6 (six) hours as needed for mild pain, moderate pain, fever or headache.     [name] aspirin EC 81 MG tablet Take 81 mg by mouth daily.    . bisoprolol-hydrochlorothiazide (ZIAC) 5-6.25 MG tablet Take 1 tablet by mouth daily. 90 tablet 3  . cyclobenzaprine (FLEXERIL) 5 MG tablet TAKE ONE TABLET BY MOUTH THREE TIMES DAILY AS NEEDED FOR muscle spasm 40 tablet 5  . empagliflozin (JARDIANCE) 10 MG TABS tablet Take 10 mg by mouth daily. 30 tablet 11  . FLUoxetine (PROZAC) 20 MG capsule TAKE 1 CAPSULE BY MOUTH ONCE DAILY 90 capsule 3  . fluticasone furoate-vilanterol (BREO ELLIPTA) 100-25 MCG/INH AEPB Inhale 1 puff into the lungs every morning. 28 each 11  . furosemide (LASIX) 40 MG tablet Take 1 tablet (40 mg total) by mouth every other day as needed. 45 tablet 3  . GARLIC PO Take 1 tablet by mouth daily.    [name] glipiZIDE (GLUCOTROL) 10 MG tablet Take 10 mg by mouth daily before breakfast.    . irbesartan (AVAPRO) 75 MG tablet Take 1 tablet (75 mg total) by mouth at bedtime. 30 tablet 0  . levothyroxine (SYNTHROID, LEVOTHROID) 112 MCG tablet Take 1 tablet (112 mcg total) by mouth daily before breakfast. Take along with 150 mcg. 90 tablet 3  . levothyroxine (SYNTHROID,  LEVOTHROID) 150 MCG tablet Take 1 tablet (150 mcg total) by mouth daily. Take along with the 112 mcg tablet for a total of 262 mcg daily. 90 tablet 3  . metFORMIN (GLUCOPHAGE-XR) 500 MG 24 hr tablet Take 2 tablets (1,000 mg total) by mouth daily with breakfast. (Patient taking differently: Take 1,000 mg by mouth daily with breakfast. ) 360 tablet 1  . montelukast (SINGULAIR) 10 MG tablet Take 1 tablet (10 mg total) by mouth at bedtime. 30 tablet 2  . OXYGEN 2lpm with sleep only  [facility]    . potassium chloride SA (K-DUR,KLOR-CON) 20 MEQ  tablet Take 1 tablet (20 mEq total) by mouth daily. 30 tablet 3  . primidone (MYSOLINE) 50 MG tablet Take 3 tablets (150 mg total) by mouth at bedtime. 90 tablet 5  . simvastatin (ZOCOR) 40 MG tablet TAKE 1 TABLET BY MOUTH AT BEDTIME 90 tablet 0  . Vitamin D, Ergocalciferol, (DRISDOL) 50000 units CAPS capsule TAKE 1 CAPSULE (50,000 UNITS TOTAL) BY MOUTH EVERY 7 (SEVEN) DAYS. 12 capsule 0   No current facility-administered medications on file prior to visit.    Review of Systems  Constitutional: Negative for other unusual diaphoresis or sweats HENT: Negative for ear discharge or swelling Eyes: Negative for other worsening visual disturbances Respiratory: Negative for stridor or other swelling  Gastrointestinal: Negative for worsening distension or other blood Genitourinary: Negative for retention or other urinary change Musculoskeletal: Negative for other MSK pain or swelling Skin: Negative for color change or other new lesions Neurological: Negative for worsening tremors and other numbness  Psychiatric/Behavioral: Negative for worsening agitation or other fatigue All other system neg per pt    Objective:   Physical Exam BP (!) 146/88   Pulse 84   Ht 5\' 2"  (1.575 m)   Wt (!) 327 lb (148.3 kg)   SpO2 96%   BMI 59.81 kg/m  VS noted, mild ill appaering Constitutional: Pt appears in NAD HENT: Head: NCAT.  Right Ear: External ear normal.  Left Ear: External ear normal.  Eyes: . Pupils are equal, round, and reactive to light. Conjunctivae and EOM are normal Nose: without d/c or deformity Bilat tm's with mild erythema.  Max sinus areas non tender.  Pharynx with mild erythema, no exudate Neck: Neck supple. Gross normal ROM Cardiovascular: Normal rate and regular rhythm.   Pulmonary/Chest: Effort normal and breath sounds decreased without rales but with bilat mild wheezing.  Neurological: Pt is alert. At baseline orientation, motor grossly intact Skin: Skin is warm. No rashes, other  new lesions, no LE edema Psychiatric: Pt behavior is normal without agitation  No other exam findings    Assessment & Plan:

## 2017-05-04 ENCOUNTER — Ambulatory Visit: Payer: Medicare Other | Admitting: Internal Medicine

## 2017-05-05 ENCOUNTER — Ambulatory Visit: Payer: Medicare Other | Admitting: Internal Medicine

## 2017-05-05 ENCOUNTER — Encounter: Payer: Self-pay | Admitting: Internal Medicine

## 2017-05-05 VITALS — BP 130/72 | HR 79 | Wt 319.0 lb

## 2017-05-05 DIAGNOSIS — E282 Polycystic ovarian syndrome: Secondary | ICD-10-CM | POA: Diagnosis not present

## 2017-05-05 DIAGNOSIS — E1165 Type 2 diabetes mellitus with hyperglycemia: Secondary | ICD-10-CM

## 2017-05-05 DIAGNOSIS — E039 Hypothyroidism, unspecified: Secondary | ICD-10-CM | POA: Diagnosis not present

## 2017-05-05 MED ORDER — METFORMIN HCL ER 500 MG PO TB24: 500.0000 mg | ORAL_TABLET | Freq: Every day | ORAL | 3 refills | Status: DC

## 2017-05-05 NOTE — Patient Instructions (Addendum)
Please continue: - Metformin ER 500 mg daily at night - Jardiance 10 mg daily in am  Please continue Levothyroxine 262 (112 + 150) g daily  Take the thyroid hormone every day, with water, at least 30 minutes before breakfast, separated by at least 4 hours from: - acid reflux medications - calcium - iron - multivitamins  Please stop at the lab.  Please return in 3-4 months with your sugar log.   Please stop at the lab.

## 2017-05-05 NOTE — Progress Notes (Signed)
Patient ID: Autumn Massey, female   DOB: Apr 13, 1966, 51 y.o.   MRN: [id number]   HPI  Autumn Massey is a 51 y.o.-year-old female, returning for f/u for DM2, hypothyroidism, and PCOS. Last visit 2 mo ago. On disability now.  2-3 weeks ago she had an upper respiratory infection and was treated with antibiotics and prednisone taper.  She initially got better but over the weekend she developed a sore throat, cough with mucus, and wheezing.  She has had a flu vaccine.  PCOS: Reviewed hx:  - weight gain: Maximal weight 364  - was on Phentermine, which worked initially, then stopped working  - Would consider gastric bypass but only as a last resort - + hirsutism >> started to see facial hair in her 30s - + hair loss >> female pattern, and bald spot in vertex - no acne - had a Mirena IUD placed 04/2013 for heavy menstrual cycles >> out now - has frequent Prednisone tapers   She was on Spironolactone >> 50 mg bid, but potassium increased >> we had to decrease to 25 mg bid, but stopped in 11/2016.  She has a little bit more facial hair but not bothersome.  Also, she noticed that her hair is not growing as fast, but it is still growing.  She would not want to restart spironolactone.  However, she still has menstrual cycles and is off her IUD and would like to discuss with OB/GYN about may be starting OCP versus HR.  Latest potassium: Lab Results  Component Value Date   K 4.2 03/10/2017   Hypothyroidism: Pt. has been dx with hypothyroidism in 2005.  Pt is on levothyroxine 262  (150 + 112 mcg) daily: - in am at 4-5 am - fasting - at least 30 min from b'fast - no Ca, Fe, MVI, PPIs - not on Biotin  I reviewed pt's thyroid tests: Lab Results  Component Value Date   TSH 5.30 (H) 03/10/2017   TSH 8.74 (H) 12/01/2016   TSH 14.632 (H) 10/15/2016   TSH 9.96 (H) 04/22/2016   TSH 2.25 07/23/2015   TSH 1.56 11/22/2014   TSH 1.692 05/24/2014   TSH 0.33 (L) 03/08/2014   TSH 0.33 (L) 01/27/2014    TSH 1.91 01/10/2014   FREET4 1.02 03/10/2017   FREET4 0.86 12/01/2016   FREET4 1.05 10/15/2016   FREET4 0.77 04/22/2016   FREET4 1.08 07/23/2015   FREET4 0.91 11/22/2014   FREET4 1.21 05/24/2014   FREET4 1.05 03/08/2014   FREET4 1.38 01/27/2014   FREET4 1.16 01/10/2014    Pt denies: - feeling nodules in neck - hoarseness - dysphagia - choking - SOB with lying down  She is on neurontin 3x a day (for tremors) but had to decrease to 2x a day today b/c somnolence.  She also tried Primidone >> restarted by Dr. Tomi Likens. She will also start Lyrica.   DM2:  Reviewed HbA1c: 03/10/2017: HbA1c calculated from Fructosamine is 6.17% (great!). 11/2016: HbA1c calculated from the fructosamine is radically better, at 5.77%! Lab Results  Component Value Date   HGBA1C 9.5 (H) 09/29/2016   HGBA1C 9.3 (H) 09/28/2016   HGBA1C 8.6 09/16/2016   She is on: - Metformin 1000 mg with dinner >> 500 mg  - Jardiance 10 mg before b'fast - started 02/2017 (stopped Furosemide) >> she feels great, but had mm cramps >> restarted potassium  Not taking Januvia >> not covered.  She checks sugars 1-2x a day: - am:97-121 >> 120-140s >> 120-140 >> 130-132 >>  101-122 - lunch: n/c - after lunch:  n/c >> 145-160, 177 >> 135-147, 152 >> n/c - dinner: n/c - after dinner: 150-160s >> 160-170, 212 (fast food) >> 130-149  She quit smoking in 05/2015.  On Vitamin D 50,000 units weekly.  ROS: Constitutional:+ weight loss (13 lbs), no fatigue, no subjective hyperthermia, no subjective hypothermia Eyes: no blurry vision, no xerophthalmia ENT: + sore throat, no nodules palpated in throat, no dysphagia, no odynophagia, no hoarseness Cardiovascular: no CP/no SOB/no palpitations/no leg swelling Respiratory: + cough/no SOB/+ wheezing Gastrointestinal: no N/no V/no D/no C/no acid reflux Musculoskeletal: + muscle aches/no joint aches, + leg crams >> better after she started potassium Skin: no rashes, no hair  loss Neurological: no tremors/no numbness/no tingling/no dizziness  I reviewed pt's medications, allergies, PMH, social hx, family hx, and changes were documented in the history of present illness. Otherwise, unchanged from my initial visit note.ote.  PE: BP 130/72 (BP Location: Left Arm, Patient Position: Sitting)   Pulse 79   Wt (!) 319 lb (144.7 kg)   LMP 04/27/2017   SpO2 95%   BMI 58.35 kg/m  Body mass index is 58.35 kg/m. Wt Readings from Last 3 Encounters:  05/05/17 (!) 319 lb (144.7 kg)  04/16/17 (!) 327 lb (148.3 kg)  03/10/17 (!) 332 lb (150.6 kg)   Constitutional: overweight, in NAD Eyes: PERRLA, EOMI, no exophthalmos ENT: moist mucous membranes, no thyromegaly, no cervical lymphadenopathy Cardiovascular: RRR, No MRG Respiratory: CTA B Gastrointestinal: abdomen soft, NT, ND, BS+ Musculoskeletal: no deformities, strength intact in all 4 Skin: moist, warm, no rashes Neurological: no tremor with outstretched hands, DTR normal in all 4  ASSESSMENT: 1. Hypothyroidism  2. PCOS  Testosterone was high >> (2015) indicating PCOS  Component     Latest Ref Rng 10/18/2013  Testosterone     10 - 70 ng/dL 78 (H)  Sex Hormone Binding     18 - 114 nmol/L 28  Testosterone Free     0.6 - 6.8 pg/mL 15.6 (H)  Testosterone-% Free     0.4 - 2.4 % 2.0  Androstenedione      93  DHEA-SO4     35 - 430 ug/dL 35    She has been tested for Cushing's sd. In 02/2012 by a Dexamethasone suppression test >> cortisol returned at 1.4 (normal). We also r/o Cushing's sd. 09/2013 by a normal 24h UFC: Component     Latest Ref Rng 10/25/2013  Cortisol (Ur), Free     4.0 - 50.0 mcg/24 h 26.4  Results received     0.63 - 2.50 g/24 h 1.83  Creatinine, Urine      59.9  Creatinine, 24H Ur     700 - 1800 mg/day 1798   She also had normal urine CA and MN (testing for 2nd HTN) >> no pheochromocytoma.  3. DM2   PLAN:  1. Patient with long-standing hypothyroidism, on levothyroxine therapy.   - latest thyroid labs reviewed with pt >> slightly high but we continued the same LT4 dose due to prev. TFT variability - she continues on LT4 262 mcg daily - pt feels good on this dose, has good energy. - we discussed about taking the thyroid hormone every day, with water, >30 minutes before breakfast, separated by >4 hours from acid reflux medications, calcium, iron, multivitamins. Pt. is taking it correctly - will check thyroid tests today: TSH and fT4 - If labs are abnormal, she will need to return for repeat TFTs in 1.5 months -  OTW, RTC in 4 mo  2. PCOS - off hormonal IUD now, as she reached the age of menopause - she is off Spironolactone since 11/2016  3. DM2 - sugars above goal at last visit >> we added Jardiance >> she loves it and lost 13 pounds after we started it.  She did develop muscle cramps, but she restarted potassium 20 mEq per day which helped.  She also has increased urination,  And she had to stop Lasix.  She wakes up at night to urinate once.  Her sugars also improved significantly and they are almost all that are close to goal now.  She had to stop glipizide due to dizziness after she started [place] and she also reduce the dose of metformin to only 1 tablet a day. - No changes needed in her regimen. - We will check a BMP since we started Jardiance at last visit - I suggested: Patient Instructions  Please continue: - Metformin ER 500 mg with dinner - Jardiance 10 mg before b'fast  Please continue Levothyroxine 262 (112 + 150) g daily  Take the thyroid hormone every day, with water, at least 30 minutes before breakfast, separated by at least 4 hours from: - acid reflux medications - calcium - iron - multivitamins  Please stop at the lab.  Please return in 3-4 months with your sugar log.   Office Visit on 05/05/2017  Component Date Value Ref Range Status  . TSH 05/05/2017 1.29  0.35 - 4.50 uIU/mL Final  . Free T4 05/05/2017 1.25  0.60 - 1.60 ng/dL Final    Comment: Specimens from patients who are undergoing biotin therapy and /or ingesting biotin supplements may contain high levels of biotin.  The higher biotin concentration in these specimens interferes with this Free T4 assay.  Specimens that contain high levels  of biotin may cause false high results for this Free T4 assay.  Please interpret results in light of the total clinical presentation of the patient.    . Glucose, Bld 05/05/2017 256* 65 - 99 mg/dL Final   Comment: .            Fasting reference interval . For someone without known diabetes, a glucose value >125 mg/dL indicates that they may have diabetes and this should be confirmed with a follow-up test. .   . BUN 05/05/2017 14  7 - 25 mg/dL Final  . Creat 05/05/2017 0.89  0.50 - 1.05 mg/dL Final   Comment: For patients >81 years of age, the reference limit for Creatinine is approximately 13% higher for people identified as African-American. .   . GFR, Est Non African American 05/05/2017 75  > OR = 60 mL/min/1.37m2 Final  . GFR, Est African American 05/05/2017 87  > OR = 60 mL/min/1.59m2 Final  . BUN/Creatinine Ratio 94/49/6759 NOT APPLICABLE  6 - 22 (calc) Final  . Sodium 05/05/2017 138  135 - 146 mmol/L Final  . Potassium 05/05/2017 4.8  3.5 - 5.3 mmol/L Final  . Chloride 05/05/2017 98  98 - 110 mmol/L Final  . CO2 05/05/2017 32  20 - 32 mmol/L Final  . Calcium 05/05/2017 9.5  8.6 - 10.4 mg/dL Final  . Fructosamine 05/05/2017 258  190 - 270 umol/L Final   TFTs now normal. Glu high. Hba1c calculated from fructosamine is great, at 6.0%.   Philemon Kingdom, MD PhD [facility]

## 2017-05-06 ENCOUNTER — Telehealth: Payer: Self-pay | Admitting: Internal Medicine

## 2017-05-06 DIAGNOSIS — R05 Cough: Secondary | ICD-10-CM

## 2017-05-06 DIAGNOSIS — R059 Cough, unspecified: Secondary | ICD-10-CM

## 2017-05-06 DIAGNOSIS — R062 Wheezing: Secondary | ICD-10-CM

## 2017-05-06 LAB — T4, FREE: FREE T4: 1.25 ng/dL (ref 0.60–1.60)

## 2017-05-06 LAB — TSH: TSH: 1.29 u[IU]/mL (ref 0.35–4.50)

## 2017-05-06 MED ORDER — PREDNISONE 10 MG PO TABS: ORAL_TABLET | ORAL | 0 refills | Status: DC

## 2017-05-06 NOTE — Telephone Encounter (Signed)
[place] for repeat prednisone as before, as well as I have ordered cxr  She will need ROV if not improved in 3-5 days

## 2017-05-06 NOTE — Telephone Encounter (Signed)
Patient states that Dr. Jenny Reichmann seen her about three weeks ago and gave her meds for congestion.  States that she does not believe that she totally got cleared up.  States she seen Dr. Cruzita Lederer yesterday and states Dr. Cruzita Lederer heard wheezing in her chest and was to contact Dr. Jenny Reichmann in regard.  States she now has a cough with no production.  Would like to know what Dr. Jenny Reichmann suggest.

## 2017-05-06 NOTE — Telephone Encounter (Signed)
Notified pt w/MD response.../lmb 

## 2017-05-07 ENCOUNTER — Ambulatory Visit (INDEPENDENT_AMBULATORY_CARE_PROVIDER_SITE_OTHER)
Admission: RE | Admit: 2017-05-07 | Discharge: 2017-05-07 | Disposition: A | Discharge status: Home / Self Care | Payer: Medicare Other | Source: Ambulatory Visit | Attending: Internal Medicine | Admitting: Internal Medicine

## 2017-05-07 DIAGNOSIS — R0602 Shortness of breath: Secondary | ICD-10-CM | POA: Diagnosis not present

## 2017-05-07 DIAGNOSIS — R059 Cough, unspecified: Secondary | ICD-10-CM

## 2017-05-07 DIAGNOSIS — R05 Cough: Secondary | ICD-10-CM | POA: Diagnosis not present

## 2017-05-07 DIAGNOSIS — R062 Wheezing: Secondary | ICD-10-CM

## 2017-05-07 LAB — BASIC METABOLIC PANEL WITH GFR
BUN: 14 mg/dL (ref 7–25)
CO2: 32 mmol/L (ref 20–32)
Calcium: 9.5 mg/dL (ref 8.6–10.4)
Chloride: 98 mmol/L (ref 98–110)
Creat: 0.89 mg/dL (ref 0.50–1.05)
GFR, EST AFRICAN AMERICAN: 87 mL/min/{1.73_m2} (ref >=60)
GFR, EST NON AFRICAN AMERICAN: 75 mL/min/{1.73_m2} (ref >=60)
Glucose, Bld: 256 mg/dL — ABNORMAL HIGH (ref 65–99)
POTASSIUM: 4.8 mmol/L (ref 3.5–5.3)
SODIUM: 138 mmol/L (ref 135–146)

## 2017-05-07 LAB — FRUCTOSAMINE: FRUCTOSAMINE: 258 umol/L (ref 190–270)

## 2017-05-14 ENCOUNTER — Ambulatory Visit (INDEPENDENT_AMBULATORY_CARE_PROVIDER_SITE_OTHER): Payer: Medicare Other | Admitting: Internal Medicine

## 2017-05-14 ENCOUNTER — Encounter: Payer: Self-pay | Admitting: Internal Medicine

## 2017-05-14 VITALS — BP 116/78 | HR 82 | Temp 98.0°F | Ht 62.0 in | Wt 319.0 lb

## 2017-05-14 DIAGNOSIS — E1165 Type 2 diabetes mellitus with hyperglycemia: Secondary | ICD-10-CM

## 2017-05-14 DIAGNOSIS — I10 Essential (primary) hypertension: Secondary | ICD-10-CM

## 2017-05-14 DIAGNOSIS — J441 Chronic obstructive pulmonary disease with (acute) exacerbation: Secondary | ICD-10-CM

## 2017-05-14 MED ORDER — PREDNISONE 10 MG PO TABS: ORAL_TABLET | ORAL | 0 refills | Status: DC

## 2017-05-14 MED ORDER — HYDROCODONE-HOMATROPINE 5-1.5 MG/5ML PO SYRP: 5.0000 mL | ORAL_SOLUTION | Freq: Four times a day (QID) | ORAL | 0 refills | Status: AC | PRN

## 2017-05-14 MED ORDER — HYDROCODONE-HOMATROPINE 5-1.5 MG/5ML PO SYRP: 5.0000 mL | ORAL_SOLUTION | Freq: Four times a day (QID) | ORAL | 0 refills | Status: DC | PRN

## 2017-05-14 MED ORDER — DOXYCYCLINE HYCLATE 100 MG PO TABS: 100.0000 mg | ORAL_TABLET | Freq: Two times a day (BID) | ORAL | 0 refills | Status: DC

## 2017-05-14 NOTE — Patient Instructions (Addendum)
Please take all new medication as prescribed - the prednisone, cough medicine, and antibiotic  Please continue all other medications as before, and refills have been done if requested.  Please have the pharmacy call with any other refills you may need.  Please continue your efforts at being more active, low cholesterol diabetic diet, and weight control.  Please keep your appointments with your specialists as you may have planned

## 2017-05-14 NOTE — [place] Notes (Signed)
Subjective:    Patient ID: Autumn Massey, female    DOB: 1965/08/19, 50 y.o.   MRN: [id number]  HPI  Here to f/u after last seen Nov 7, was improved and felt quite well for about 3-4 days, but then gradually worsening again in last 3-4 days assoc with HA, general weakness and malaise, with prod cough greenish sputum and mild  sob/wheezing, but Pt denies chest pain, orthopnea, PND, increased LE swelling, palpitations, dizziness or syncope.  Pt denies wt loss, night sweats, loss of appetite, or other constitutional symptoms, but cannot sleep due to cough. Nov 8 cxr  - neg for acute Wt Readings from Last 3 Encounters:  05/14/17 (!) 319 lb (144.7 kg)  05/05/17 (!) 319 lb (144.7 kg)  04/16/17 (!) 327 lb (148.3 kg)   Past Medical History:  Diagnosis Date  . ANXIETY DEPRESSION 01/29/2009  . DEPRESSION 11/19/2007  . Diabetes ([place])   . FREQUENCY, URINARY 10/17/2009  . HYPERLIPIDEMIA 11/19/2007  . HYPERTENSION 11/19/2007  . HYPOTHYROIDISM 11/19/2007  . Morbid obesity ([place])   . PNEUMONIA 08/06/2010  . POLYCYTHEMIA 01/29/2009  . Preventative health care 04/06/2011  . Sleep apnea    mild sleep apnea, on o2 at 2l at nighttime  . SLEEP RELATED HYPOVENTILATION/HYPOXEMIA CCE 02/08/2009  . Venous insufficiency 07/19/2012   Past Surgical History:  Procedure Laterality Date  . LEFT ULNAR NERVE DECOMPRESSION Left 02/22/2013   Performed by Leanora Cover, MD at [facility]  . TONSILLECTOMY      reports that she quit smoking about 1 years ago. Her smoking use included cigarettes. She has a 11.00 pack-year smoking history. she has never used smokeless tobacco. She reports that she drinks alcohol. She reports that she does not use drugs. family history includes Alcohol abuse in her other; Arthritis in her other; Cancer in her maternal grandfather and mother; Hypertension in her other. Allergies  Allergen Reactions  . Cymbalta [Duloxetine Hcl]    Current Outpatient Medications on File Prior to Visit    Medication Sig Dispense Refill  . acetaminophen (TYLENOL) 500 MG tablet Take 1,000 mg by mouth every 6 (six) hours as needed for mild pain, moderate pain, fever or headache.     . albuterol (PROAIR HFA) 108 (90 Base) MCG/ACT inhaler 2 puffs every 4 hours as needed only  if your can't catch your breath 1 Inhaler 5  . albuterol (PROVENTIL) (2.5 MG/3ML) 0.083% nebulizer solution Take 3 mLs (2.5 mg total) by nebulization every 6 (six) hours as needed for wheezing or shortness of breath. 75 mL 0  . aspirin EC 81 MG tablet Take 81 mg by mouth daily.    . bisoprolol-hydrochlorothiazide (ZIAC) 5-6.25 MG tablet Take 1 tablet by mouth daily. 90 tablet 3  . cyclobenzaprine (FLEXERIL) 5 MG tablet TAKE ONE TABLET BY MOUTH THREE TIMES DAILY AS NEEDED FOR muscle spasm 40 tablet 5  . empagliflozin (JARDIANCE) 10 MG TABS tablet Take 10 mg by mouth daily. 30 tablet 11  . FLUoxetine (PROZAC) 20 MG capsule TAKE 1 CAPSULE BY MOUTH ONCE DAILY 90 capsule 3  . fluticasone furoate-vilanterol (BREO ELLIPTA) 100-25 MCG/INH AEPB Inhale 1 puff into the lungs every morning. 28 each 11  . GARLIC PO Take 1 tablet by mouth daily.    . irbesartan (AVAPRO) 75 MG tablet Take 1 tablet (75 mg total) by mouth at bedtime. 30 tablet 0  . levothyroxine (SYNTHROID, LEVOTHROID) 112 MCG tablet Take 1 tablet (112 mcg total) by mouth daily before breakfast. Take  along with 150 mcg. 90 tablet 3  . levothyroxine (SYNTHROID, LEVOTHROID) 150 MCG tablet Take 1 tablet (150 mcg total) by mouth daily. Take along with the 112 mcg tablet for a total of 262 mcg daily. 90 tablet 3  . LYRICA 50 MG capsule Take 50 mg 2 (two) times daily by mouth.  3  . metFORMIN (GLUCOPHAGE-XR) 500 MG 24 hr tablet Take 1 tablet (500 mg total) daily with breakfast by mouth. 90 tablet 3  . montelukast (SINGULAIR) 10 MG tablet Take 1 tablet (10 mg total) by mouth at bedtime. 30 tablet 2  . OXYGEN 2lpm with sleep only  [facility]    . potassium chloride SA (K-DUR,KLOR-CON) 20 MEQ  tablet Take 1 tablet (20 mEq total) by mouth daily. 30 tablet 3  . primidone (MYSOLINE) 50 MG tablet Take 3 tablets (150 mg total) by mouth at bedtime. 90 tablet 5  . simvastatin (ZOCOR) 40 MG tablet TAKE 1 TABLET BY MOUTH AT BEDTIME 90 tablet 0  . Vitamin D, Ergocalciferol, (DRISDOL) 50000 units CAPS capsule TAKE 1 CAPSULE (50,000 UNITS TOTAL) BY MOUTH EVERY 7 (SEVEN) DAYS. 12 capsule 0   No current facility-administered medications on file prior to visit.    Review of Systems  Constitutional: Negative for other unusual diaphoresis or sweats HENT: Negative for ear discharge or swelling Eyes: Negative for other worsening visual disturbances Respiratory: Negative for stridor or other swelling  Gastrointestinal: Negative for worsening distension or other blood Genitourinary: Negative for retention or other urinary change Musculoskeletal: Negative for other MSK pain or swelling Skin: Negative for color change or other new lesions Neurological: Negative for worsening tremors and other numbness  Psychiatric/Behavioral: Negative for worsening agitation or other fatigue All other system neg per pt    Objective:   Physical Exam BP 116/78   Pulse 82   Temp 98 F (36.7 C) (Oral)   Ht 5\' 2"  (1.575 m)   Wt (!) 319 lb (144.7 kg)   LMP 04/30/2017   SpO2 97%   BMI 58.35 kg/m  VS noted, mild ill Constitutional: Pt appears in NAD HENT: Head: NCAT.  Right Ear: External ear normal.  Left Ear: External ear normal.  Eyes: . Pupils are equal, round, and reactive to light. Conjunctivae and EOM are normal Nose: without d/c or deformity Bilat tm's with mild erythema.  Max sinus areas non tender.  Pharynx with mild erythema, no exudate Neck: Neck supple. Gross normal ROM Cardiovascular: Normal rate and regular rhythm.   Pulmonary/Chest: Effort normal and breath sounds decreased without rales but with mild diffuse scattered wheezing.  Neurological: Pt is alert. At baseline orientation, motor grossly  intact Skin: Skin is warm. No rashes, other new lesions, no LE edema Psychiatric: Pt behavior is normal without agitation  No other exam findings    Assessment & Plan:

## 2017-05-17 ENCOUNTER — Encounter: Payer: Self-pay | Admitting: Internal Medicine

## 2017-05-17 NOTE — Assessment & Plan Note (Signed)
stable overall by history and exam,  and pt to continue medical treatment as before,  to f/u any worsening symptoms or concerns, pt to call for onset polys or cbg > 200

## 2017-05-17 NOTE — Assessment & Plan Note (Signed)
Mild to mod, for antibx course, cough med prn, predpac asd, to f/u any worsening symptoms or concerns

## 2017-05-19 ENCOUNTER — Other Ambulatory Visit: Payer: Self-pay | Admitting: Internal Medicine

## 2017-05-25 ENCOUNTER — Other Ambulatory Visit: Payer: Self-pay

## 2017-05-25 MED ORDER — PRIMIDONE 50 MG PO TABS: 150.0000 mg | ORAL_TABLET | Freq: Every day | ORAL | 3 refills | Status: DC

## 2017-05-27 ENCOUNTER — Other Ambulatory Visit: Payer: Medicare Other

## 2017-06-02 ENCOUNTER — Ambulatory Visit: Payer: Medicare Other | Admitting: Internal Medicine

## 2017-06-13 ENCOUNTER — Other Ambulatory Visit: Payer: Self-pay | Admitting: Family Medicine

## 2017-07-03 ENCOUNTER — Other Ambulatory Visit: Payer: Self-pay | Admitting: Internal Medicine

## 2017-07-08 ENCOUNTER — Other Ambulatory Visit: Payer: Self-pay | Admitting: Internal Medicine

## 2017-07-08 ENCOUNTER — Other Ambulatory Visit: Payer: Self-pay

## 2017-07-08 MED ORDER — PRIMIDONE 50 MG PO TABS: 150.0000 mg | ORAL_TABLET | Freq: Every day | ORAL | 6 refills | Status: DC

## 2017-08-05 ENCOUNTER — Other Ambulatory Visit: Payer: Self-pay

## 2017-08-05 MED ORDER — BISOPROLOL-HYDROCHLOROTHIAZIDE 5-6.25 MG PO TABS: 1.0000 | ORAL_TABLET | Freq: Every day | ORAL | 0 refills | Status: DC

## 2017-08-05 MED ORDER — SIMVASTATIN 40 MG PO TABS: 40.0000 mg | ORAL_TABLET | Freq: Every day | ORAL | 0 refills | Status: DC

## 2017-08-31 ENCOUNTER — Encounter: Payer: Self-pay | Admitting: Neurology

## 2017-08-31 ENCOUNTER — Ambulatory Visit: Payer: Medicare Other | Admitting: Internal Medicine

## 2017-08-31 ENCOUNTER — Encounter: Payer: Self-pay | Admitting: Internal Medicine

## 2017-08-31 ENCOUNTER — Ambulatory Visit: Payer: Medicare Other | Admitting: Neurology

## 2017-08-31 VITALS — BP 118/62 | HR 78 | Ht 62.0 in | Wt 331.4 lb

## 2017-08-31 VITALS — BP 110/70 | HR 78 | Ht 62.0 in | Wt 331.5 lb

## 2017-08-31 DIAGNOSIS — G250 Essential tremor: Secondary | ICD-10-CM

## 2017-08-31 DIAGNOSIS — E039 Hypothyroidism, unspecified: Secondary | ICD-10-CM

## 2017-08-31 DIAGNOSIS — E1165 Type 2 diabetes mellitus with hyperglycemia: Secondary | ICD-10-CM

## 2017-08-31 DIAGNOSIS — G629 Polyneuropathy, unspecified: Secondary | ICD-10-CM

## 2017-08-31 DIAGNOSIS — R300 Dysuria: Secondary | ICD-10-CM

## 2017-08-31 LAB — POCT GLYCOSYLATED HEMOGLOBIN (HGB A1C): Hemoglobin A1C: 8.6

## 2017-08-31 MED ORDER — LYRICA 50 MG PO CAPS: 50.0000 mg | ORAL_CAPSULE | Freq: Two times a day (BID) | ORAL | 3 refills | Status: DC

## 2017-08-31 NOTE — Progress Notes (Signed)
NEUROLOGY FOLLOW UP OFFICE NOTE  Autumn Massey [id number]  HISTORY OF PRESENT ILLNESS: Autumn Massey is a 51 year old right-handed woman with hypothyroidism, hyperlipidemia, impaired glucose tolerance, hypertension, hypoventilation, cerebrovascular disease and former smoker who follows up for essential tremor.   UPDATE: She is taking primidone 150mg  at bedtime.  Tremor is controlled.  She is able to write and eat. For neuropathic pain, she tried Lyrica 50mg  twice daily but discontinued it after a week due to causing changes in mood.  She became significantly more irritable.  It did help her pain, however.   HISTORY: She underwent left ulnar decompression surgery in August 2014.  When the cast was removed the following month, she noted twitching of her 5th digit.[name]  An MRI of the cervical spine was performed in December 2014, which revealed no structural etiology for radiculopathy.  She was evaluated by Dr. Catalina Gravel, a neurologist in [place], who performed a [place] in January 2015.  It was essentially a normal exam.  She continues to have numbness, paresthesias and weakness of the 4th and 5th digits of the left hand.  She later developed tremor in both hands, more noticeable in the right hand since she is right-hand dominant.  It occurs when she is writing or holding utensils.  She denies gait difficulty, rigidity, lack of sense of smell, or history consistent with REM sleep behavior.  She is concerned about Parkinson's disease because her maternal grandfather and uncle had it.  Another uncle has tremor.   She has numbness and tingling in the toes and fingers.  She also notes electric pain in the right leg below the knee to the foot. For neuropathic pain, she has tried gabapentin and Cymbalta in the past.   For the past few months, she reports increased short term memory problems.  It may take a few seconds to recall a name, word-finding difficulty or remembering what she needed to tell  somebody.  She is independent to all simple and complex ADLs, including paying bills and driving.  PAST MEDICAL HISTORY: Past Medical History:  Diagnosis Date  . ANXIETY DEPRESSION 01/29/2009  . DEPRESSION 11/19/2007  . Diabetes ([place])   . FREQUENCY, URINARY 10/17/2009  . HYPERLIPIDEMIA 11/19/2007  . HYPERTENSION 11/19/2007  . HYPOTHYROIDISM 11/19/2007  . Morbid obesity ([place])   . PNEUMONIA 08/06/2010  . POLYCYTHEMIA 01/29/2009  . Preventative health care 04/06/2011  . Sleep apnea    mild sleep apnea, on o2 at 2l at nighttime  . SLEEP RELATED HYPOVENTILATION/HYPOXEMIA CCE 02/08/2009  . Venous insufficiency 07/19/2012    MEDICATIONS: Current Outpatient Medications on File Prior to Visit  Medication Sig Dispense Refill  . acetaminophen (TYLENOL) 500 MG tablet Take 1,000 mg by mouth every 6 (six) hours as needed for mild pain, moderate pain, fever or headache.     . albuterol (PROAIR HFA) 108 (90 Base) MCG/ACT inhaler 2 puffs every 4 hours as needed only  if your can't catch your breath 1 Inhaler 5  . albuterol (PROVENTIL) (2.5 MG/3ML) 0.083% nebulizer solution Take 3 mLs (2.5 mg total) by nebulization every 6 (six) hours as needed for wheezing or shortness of breath. 75 mL 0  . aspirin EC 81 MG tablet Take 81 mg by mouth daily.    . bisoprolol-hydrochlorothiazide (ZIAC) 5-6.25 MG tablet Take 1 tablet by mouth daily. 90 tablet 0  . cyclobenzaprine (FLEXERIL) 5 MG tablet TAKE 1 TABLET BY MOUTH 3 TIMES DAILY AS NEEDED FOR MUSCLE SPASMS 40 tablet 0  . empagliflozin (  JARDIANCE) 10 MG TABS tablet Take 10 mg by mouth daily. 30 tablet 11  . FLUoxetine (PROZAC) 20 MG capsule TAKE 1 CAPSULE BY MOUTH ONCE DAILY 90 capsule 3  . fluticasone furoate-vilanterol (BREO ELLIPTA) 100-25 MCG/INH AEPB Inhale 1 puff into the lungs every morning. 28 each 11  . GARLIC PO Take 1 tablet by mouth daily.    . irbesartan (AVAPRO) 75 MG tablet Take 1 tablet (75 mg total) by mouth at bedtime. 30 tablet 0  . levothyroxine  (SYNTHROID, LEVOTHROID) 112 MCG tablet Take 1 tablet (112 mcg total) by mouth daily before breakfast. Take along with 150 mcg. 90 tablet 3  . levothyroxine (SYNTHROID, LEVOTHROID) 150 MCG tablet Take 1 tablet (150 mcg total) by mouth daily. Take along with the 112 mcg tablet for a total of 262 mcg daily. 90 tablet 3  . metFORMIN (GLUCOPHAGE-XR) 500 MG 24 hr tablet Take 1 tablet (500 mg total) daily with breakfast by mouth. 90 tablet 3  . montelukast (SINGULAIR) 10 MG tablet TAKE 1 TABLET BY MOUTH AT  BEDTIME 90 tablet 0  . OXYGEN 2lpm with sleep only  [facility]    . potassium chloride SA (K-DUR,KLOR-CON) 20 MEQ tablet Take 1 tablet (20 mEq total) by mouth daily. 30 tablet 3  . primidone (MYSOLINE) 50 MG tablet Take 3 tablets (150 mg total) by mouth at bedtime. 90 tablet 5  . simvastatin (ZOCOR) 40 MG tablet TAKE 1 TABLET BY MOUTH AT  BEDTIME 30 tablet 0  . simvastatin (ZOCOR) 40 MG tablet Take 1 tablet (40 mg total) by mouth at bedtime. 90 tablet 0  . Vitamin D, Ergocalciferol, (DRISDOL) 50000 units CAPS capsule TAKE 1 CAPSULE (50,000 UNITS TOTAL) BY MOUTH EVERY 7 (SEVEN) DAYS. 12 capsule 0   No current facility-administered medications on file prior to visit.     ALLERGIES: Allergies  Allergen Reactions  . Cymbalta [Duloxetine Hcl]     FAMILY HISTORY: Family History  Problem Relation Age of Onset  . Cancer Mother        melanoma   . Cancer Maternal Grandfather        prostate cancer  . Alcohol abuse Other   . Arthritis Other   . Hypertension Other     SOCIAL HISTORY: Social History   Socioeconomic History  . Marital status: Divorced    Spouse name: Not on file  . Number of children: 0  . Years of education: 44  . Highest education level: Not on file  Social Needs  . Financial resource strain: Not on file  . Food insecurity - worry: Not on file  . Food insecurity - inability: Not on file  . Transportation needs - medical: Not on file  . Transportation needs - non-medical: Not  on file  Occupational History  . Occupation: Disabled    Employer: [employer]  Tobacco Use  . Smoking status: Former Smoker    Packs/day: 0.50    Years: 22.00    Pack years: 11.00    Types: Cigarettes    Last attempt to quit: 05/22/2015    Years since quitting: 2.2  . Smokeless tobacco: Never Used  Substance and Sexual Activity  . Alcohol use: Yes    Alcohol/week: 0.0 oz    Comment: rare  . Drug use: No  . Sexual activity: No    Birth control/protection: IUD  Other Topics Concern  . Not on file  Social History Narrative  . Not on file    REVIEW OF SYSTEMS:  Constitutional: No fevers, chills, or sweats, no generalized fatigue, change in appetite Eyes: No visual changes, double vision, eye pain Ear, nose and throat: No hearing loss, ear pain, nasal congestion, sore throat Cardiovascular: No chest pain, palpitations Respiratory:  No shortness of breath at rest or with exertion, wheezes GastrointestinaI: No nausea, vomiting, diarrhea, abdominal pain, fecal incontinence Genitourinary:  No dysuria, urinary retention or frequency Musculoskeletal:  No neck pain, back pain Integumentary: No rash, pruritus, skin lesions Neurological: as above Psychiatric: No depression, insomnia, anxiety Endocrine: No palpitations, fatigue, diaphoresis, mood swings, change in appetite, change in weight, increased thirst Hematologic/Lymphatic:  No purpura, petechiae. Allergic/Immunologic: no itchy/runny eyes, nasal congestion, recent allergic reactions, rashes  PHYSICAL EXAM: Vitals:   08/31/17 1428  BP: 110/70  Pulse: 78  SpO2: 93%   General: No acute distress.  Patient appears well-groomed.  Morbidly obese body habitus. Head:  Normocephalic/atraumatic Eyes:  Fundi examined but not visualized Neck: supple, no paraspinal tenderness, full range of motion Heart:  Regular rate and rhythm Lungs:  Clear to auscultation bilaterally Back: No paraspinal tenderness Neurological Exam: alert  and oriented to person, place, and time. Attention span and concentration intact, recent and remote memory intact, fund of knowledge intact.  Speech fluent and not dysarthric, language intact.  CN II-XII intact. Bulk and tone normal, muscle strength 5/5 throughout.  Sensation to light touch  intact.  Deep tendon reflexes absent throughout.  Finger to nose testing intact.  Wide-based gait.  IMPRESSION: Essential tremor Neuropathy  PLAN: 1.  Continue primidone 150mg  at bedtime 2.  She will retry Lyrica at 50mg  at bedtime.  In one week, she will increase dose to 50mg  twice daily.  If she develops irritability again, then she will contact me and we will try a different medication. 3.  Weight loss 4.  Follow up in 6 months.  20 minutes spent face to face with patient, over 50% spent discussing management.  Metta Clines, DO  CC: [name], MD

## 2017-08-31 NOTE — Patient Instructions (Signed)
1.  Continue primidone 150mg  at bedtime 2.  Retry Lyrica 50mg .  Start 1 at bedtime for one week and then increase to 1 pill twice daily. 3.  Follow up in 6 months.

## 2017-08-31 NOTE — Patient Instructions (Addendum)
Please continue: - Metformin ER 500 mg with dinner  Please restart: - Jardiance 10 mg before b'fast  Please continue Levothyroxine 262 (112+150) g daily  Take the thyroid hormone every day, with water, at least 30 minutes before breakfast, separated by at least 4 hours from: - acid reflux medications - calcium - iron - multivitamins  Please return in 4 months with your sugar log.

## 2017-08-31 NOTE — Progress Notes (Signed)
Patient ID: Autumn Massey, female   DOB: 19-Aug-1965, 52 y.o.   MRN: [id number]   HPI  Autumn Massey is a 52 y.o.-year-old female, returning for f/u for DM2, hypothyroidism, and PCOS. Last visit 4 mo ago. On disability now.  PCOS: Reviewed history:  - weight gain: Maximal weight 364  - was on Phentermine, which worked initially, then stopped working  - Would consider gastric bypass but only as a last resort - + hirsutism >> started to see facial hair in her 30s - + hair loss >> female pattern, and bald spot in vertex - no acne - had a Mirena IUD placed 04/2013 for heavy menstrual cycles >> out now - has frequent Prednisone tapers   She was previously on spironolactone: 50 mg twice daily, but her potassium increased, so we had to decrease the dose to 25 mg twice daily.  We ended up stopping spironolactone in 11/2016.  She has  more facial hair and also feels that she has more frontal balding.    She also stopped her IUD, but she still had menstrual cycles and was planning to discuss with OB/GYN about OCPs versus hormone replacement therapy. However, no menses since >> she did not start either of the above.  Hypothyroidism: Pt. has been dx with hypothyroidism in 2005.  Pt is on levothyroxine 262 (150+112) microgramsdaily: - in am - fasting - at least 30 min from b'fast - no Ca, Fe, MVI, PPIs - not on Biotin  Reviewed patient's thyroid tests: Lab Results  Component Value Date   TSH 1.29 05/05/2017   TSH 5.30 (H) 03/10/2017   TSH 8.74 (H) 12/01/2016   TSH 14.632 (H) 10/15/2016   TSH 9.96 (H) 04/22/2016   TSH 2.25 07/23/2015   TSH 1.56 11/22/2014   TSH 1.692 05/24/2014   TSH 0.33 (L) 03/08/2014   TSH 0.33 (L) 01/27/2014   FREET4 1.25 05/05/2017   FREET4 1.02 03/10/2017   FREET4 0.86 12/01/2016   FREET4 1.05 10/15/2016   FREET4 0.77 04/22/2016   FREET4 1.08 07/23/2015   FREET4 0.91 11/22/2014   FREET4 1.21 05/24/2014   FREET4 1.05 03/08/2014   FREET4 1.38 01/27/2014     Pt denies: - feeling nodules in neck - hoarseness - dysphagia - choking - SOB with lying down  She is on neurontin 3x a day (for tremors) but had to decrease to 2x a day b/c somnolence. Then stopped. She also tried Primidone >> restarted by Dr. Tomi Likens. She stopped Lyrica  DM2:  Reviewed HbA1c: 05/05/2017: HbA1c calculated from Fructosamine is 6.00%. 03/10/2017: HbA1c calculated from Fructosamine is 6.17% (great!). 11/2016: HbA1c calculated from the fructosamine is radically better, at 5.77%! Lab Results  Component Value Date   HGBA1C 8.6 08/31/2017   HGBA1C 9.5 (H) 09/29/2016   HGBA1C 9.3 (H) 09/28/2016   She is on: - Metformin 1000 mg with dinner >> 500 mg  - Jardiance 10 mg before b'fast -  started 02/2017 >> stopped 2 weeks ago b/c UTI and also had a tear on her labia and was afraid that this could have gotten infected if she continued with Jardiance.  This is now healing.  However, she still has dysuria and wonders if we can check a urinalysis at this visit.  She has more leg swelling since she stopped Jardiance and would like to restart it.  Not taking Januvia >> not covered.  She checks sugars 1-2 times a day: - am: 130-132 >> 101-122 >> 127-140 - lunch: n/c - after lunch:  135-147, 152 >> n/c - dinner: n/c - after dinner:160-170, 212 (fast food) >> 130-149 >> 160-170, 188 Highest: 188  She quit smoking in 05/2015.  On Vitamin D 50,000 units weekly.  ROS: Constitutional: + weight gain, no fatigue, no subjective hyperthermia, no subjective hypothermia, + burning with urination Eyes: no blurry vision, no xerophthalmia ENT: no sore throat, + see HPI Cardiovascular: no CP/no SOB/no palpitations/+ leg swelling Respiratory: no cough/no SOB/no wheezing Gastrointestinal: no N/no V/no D/no C/no acid reflux Musculoskeletal: no muscle aches/no joint aches Skin: no rashes, no hair loss Neurological: no tremors/no numbness/no tingling/no dizziness  I reviewed pt's  medications, allergies, PMH, social hx, family hx, and changes were documented in the history of present illness. Otherwise, unchanged from my initial visit note.  PE: BP 118/62   Pulse 78   Ht 5\' 2"  (1.575 m)   Wt (!) 331 lb 6.4 oz (150.3 kg)   SpO2 95%   BMI 60.61 kg/m  Body mass index is 60.61 kg/m. Wt Readings from Last 3 Encounters:  08/31/17 (!) 331 lb 6.4 oz (150.3 kg)  08/31/17 (!) 331 lb 8 oz (150.4 kg)  05/14/17 (!) 319 lb (144.7 kg)   Constitutional: Obese , in NAD Eyes: PERRLA, EOMI, no exophthalmos ENT: moist mucous membranes, no thyromegaly, no cervical lymphadenopathy Cardiovascular: RRR, No MRG Respiratory: CTA B Gastrointestinal: abdomen soft, NT, ND, BS+ Musculoskeletal: no deformities, strength intact in all 4 Skin: moist, warm, + stasis dermatitis bilateral legs Neurological: no tremor with outstretched hands, DTR normal in all 4  ASSESSMENT: 1. Hypothyroidism  2. PCOS  Testosterone was high >> (2015) indicating PCOS  Component     Latest Ref Rng 10/18/2013  Testosterone     10 - 70 ng/dL 78 (H)  Sex Hormone Binding     18 - 114 nmol/L 28  Testosterone Free     0.6 - 6.8 pg/mL 15.6 (H)  Testosterone-% Free     0.4 - 2.4 % 2.0  Androstenedione      93  DHEA-SO4     35 - 430 ug/dL 35    She has been tested for Cushing's sd. In 02/2012 by a Dexamethasone suppression test >> cortisol returned at 1.4 (normal). We also r/o Cushing's sd. 09/2013 by a normal 24h UFC: Component     Latest Ref Rng 10/25/2013  Cortisol (Ur), Free     4.0 - 50.0 mcg/24 h 26.4  Results received     0.63 - 2.50 g/24 h 1.83  Creatinine, Urine      59.9  Creatinine, 24H Ur     700 - 1800 mg/day 1798   She also had normal urine CA and MN (testing for 2nd HTN) >> no pheochromocytoma.  3. DM2   4.  Dysuria   PLAN:  1. Patient with  long-standing  hypothyroidism, on levothyroxine therapy.  - latest thyroid labs reviewed with pt >> normal at last visit - she  continues on LT4 262 mcg daily - pt feels good on this dose. - we discussed about taking the thyroid hormone every day, with water, >30 minutes before breakfast, separated by >4 hours from acid reflux medications, calcium, iron, multivitamins. Pt. is taking it correctly.  2. PCOS - Off hormonal IUD now, as she reached the age of menopause -  we stopped spironolactone in 11/2016, but her hair on face has increased and she has alopecia >> would like to restart at next visit  3. DM2 - Sugars improved after we added  Jardiance.  She also lost weight and she felt very well on it.  However, she developed muscle cramps and she was taking potassium.  She also developed increased urination and she had to stop Lasix. - at last visit, her sugars were close to goal and we did not change her regimen - At this visit, sugars are higher, since she has been off the Jardiance.  We will check a urinalysis today but since her labial tear is almost resolved, I advised her to restart Jardiance. - I suggested:  Patient Instructions  Please continue: - Metformin ER 500 mg with dinner  Please restart: - Jardiance 10 mg before b'fast  Please continue Levothyroxine 262 (112+150) g daily  Take the thyroid hormone every day, with water, at least 30 minutes before breakfast, separated by at least 4 hours from: - acid reflux medications - calcium - iron - multivitamins  Please return in 4 months with your sugar log.   4.  Dysuria - We will check a urinalysis today  Component     Latest Ref Rng & Units 08/31/2017  Fructosamine     190 - 270 umol/L 266  HbA1c calculated from Fructosamine is 6.1% (great!).   Component     Latest Ref Rng & Units 08/31/2017  Color, Urine     YELLOW YELLOW  Appearance     CLEAR CLEAR  Specific Gravity, Urine     1.001 - 1.03 1.008  pH     5.0 - 8.0 5.5  Glucose     NEGATIVE NEGATIVE  Bilirubin Urine     NEGATIVE NEGATIVE  Ketones, ur     NEGATIVE NEGATIVE  Hgb urine  dipstick     NEGATIVE NEGATIVE  Protein     NEGATIVE NEGATIVE  Nitrites, Initial     NEGATIVE NEGATIVE  Leukocyte Esterase     NEGATIVE TRACE (A)  WBC, UA     0 - 5 /HPF 0-5  RBC / HPF     0 - 2 /HPF NONE SEEN  Squamous Epithelial / LPF     < OR = 5 /HPF NONE SEEN  Bacteria, UA     NONE SEEN /HPF NONE SEEN  Hyaline Cast     NONE SEEN /LPF NONE SEEN   ISOLATE 1: Escherichia coli Abnormal    Comment: 10,000-50,000 CFU/mL of Escherichia coli   No UTI.  Philemon Kingdom, MD PhD [facility] Endocrinology

## 2017-09-02 LAB — URINE CULTURE
MICRO NUMBER: 90281045
SPECIMEN QUALITY: ADEQUATE

## 2017-09-02 LAB — URINALYSIS W MICROSCOPIC + REFLEX CULTURE
BACTERIA UA: NONE SEEN /HPF
BILIRUBIN URINE: NEGATIVE
Glucose, UA: NEGATIVE
HGB URINE DIPSTICK: NEGATIVE
Hyaline Cast: NONE SEEN /LPF
KETONES UR: NEGATIVE
NITRITES URINE, INITIAL: NEGATIVE
PROTEIN: NEGATIVE
RBC / HPF: NONE SEEN /HPF (ref 0–2)
SPECIFIC GRAVITY, URINE: 1.008 (ref 1.001–1.03)
SQUAMOUS EPITHELIAL / LPF: NONE SEEN /HPF (ref <=5)
pH: 5.5 (ref 5.0–8.0)

## 2017-09-02 LAB — FRUCTOSAMINE: FRUCTOSAMINE: 266 umol/L (ref 190–270)

## 2017-09-02 LAB — CULTURE INDICATED

## 2017-09-09 ENCOUNTER — Other Ambulatory Visit: Payer: Self-pay | Admitting: Internal Medicine

## 2017-09-19 ENCOUNTER — Other Ambulatory Visit: Payer: Self-pay | Admitting: Internal Medicine

## 2017-09-23 ENCOUNTER — Encounter: Payer: Self-pay | Admitting: Internal Medicine

## 2017-09-23 ENCOUNTER — Other Ambulatory Visit: Payer: Self-pay | Admitting: Family Medicine

## 2017-09-24 ENCOUNTER — Other Ambulatory Visit: Payer: Self-pay | Admitting: Internal Medicine

## 2017-09-24 MED ORDER — IRBESARTAN 75 MG PO TABS: 75.0000 mg | ORAL_TABLET | Freq: Every day | ORAL | 3 refills | Status: DC

## 2017-09-24 MED ORDER — CLONAZEPAM 0.5 MG PO TABS: 0.5000 mg | ORAL_TABLET | Freq: Two times a day (BID) | ORAL | 5 refills | Status: DC | PRN

## 2017-09-24 MED ORDER — CYCLOBENZAPRINE HCL 5 MG PO TABS: ORAL_TABLET | ORAL | 1 refills | Status: DC

## 2017-10-19 ENCOUNTER — Ambulatory Visit: Payer: Medicare Other | Admitting: Internal Medicine

## 2017-10-19 ENCOUNTER — Encounter: Payer: Self-pay | Admitting: Internal Medicine

## 2017-10-19 NOTE — Telephone Encounter (Signed)
Scheduling to see pt note for cancel appt

## 2017-10-22 ENCOUNTER — Encounter: Payer: Self-pay | Admitting: Neurology

## 2017-11-10 ENCOUNTER — Other Ambulatory Visit: Payer: Self-pay | Admitting: Internal Medicine

## 2017-11-19 ENCOUNTER — Other Ambulatory Visit: Payer: Self-pay | Admitting: Neurology

## 2017-11-20 ENCOUNTER — Other Ambulatory Visit: Payer: Self-pay | Admitting: Internal Medicine

## 2017-11-25 ENCOUNTER — Other Ambulatory Visit: Payer: Self-pay | Admitting: Internal Medicine

## 2018-01-05 ENCOUNTER — Ambulatory Visit: Payer: Medicare Other | Admitting: Internal Medicine

## 2018-01-05 DIAGNOSIS — Z0289 Encounter for other administrative examinations: Secondary | ICD-10-CM

## 2018-01-05 NOTE — Progress Notes (Deleted)
Patient ID: [place] Kirchner, female   DOB: 1966/03/30, 52 y.o.   MRN: [id number]   HPI  Edison Wollschlager is a 52 y.o.-year-old female, returning for f/u for DM2, hypothyroidism, and PCOS. Last visit 4 months ago. On disability now.  PCOS: Reviewed history:  - weight gain: Maximal weight 364 pounds  - was on Phentermine, which worked initially, then stopped working  - Would consider gastric bypass but only as a last resort - + hirsutism >> started to see facial hair in her 30s - + hair loss >> female pattern, and bald spot in vertex - no acne - had a Mirena IUD placed 04/2013 for heavy menstrual cycles >> out now - Has to have frequent prednisone tapers.  She was previously on spironolactone: 50 mg twice daily, but her potassium increased so we had to decrease the dose to 25 mg twice daily.   We stopped spironolactone in 11/2016 however, since then, she had more facial hair and frontal balding.  She is considering restarting it.  Reviewed most recent potassium levels: Lab Results  Component Value Date   K 4.8 05/05/2017   K 4.2 03/10/2017   K 4.4 11/04/2016   K 4.5 10/21/2016   K 4.7 10/15/2016   K 4.4 10/15/2016   K 4.0 10/15/2016   K 4.0 10/15/2016   K 4.3 10/14/2016   K 4.0 10/14/2016   She stopped her IUD and she has no menses now.  Hypothyroidism: Pt. has been dx with hypothyroidism  in 2005.  Pt is on levothyroxine 262 (150+112) micrograms daily: - in am - fasting - at least 30 min from b'fast - no Ca, Fe, MVI, PPIs - not on Biotin  Reviewed patient's TFTs: Lab Results  Component Value Date   TSH 1.29 05/05/2017   TSH 5.30 (H) 03/10/2017   TSH 8.74 (H) 12/01/2016   TSH 14.632 (H) 10/15/2016   TSH 9.96 (H) 04/22/2016   TSH 2.25 07/23/2015   TSH 1.56 11/22/2014   TSH 1.692 05/24/2014   TSH 0.33 (L) 03/08/2014   TSH 0.33 (L) 01/27/2014   FREET4 1.25 05/05/2017   FREET4 1.02 03/10/2017   FREET4 0.86 12/01/2016   FREET4 1.05 10/15/2016   FREET4 0.77 04/22/2016    FREET4 1.08 07/23/2015   FREET4 0.91 11/22/2014   FREET4 1.21 05/24/2014   FREET4 1.05 03/08/2014   FREET4 1.38 01/27/2014    Pt denies: - feeling nodules in neck - hoarseness - dysphagia - choking - SOB with lying down  She was on Neurontin 3 times a day for tremors, then had to decrease to twice a day because of somnolence.  She is currently off Neurontin. She was on Lyrica in the past, then stopped. She also tried Primidone >> restarted by Dr. Tomi Likens.    DM2:  Reviewed HbA1c: 08/31/2017: HbA1c calculated from fructosamine 6.1% 05/05/2017: HbA1c calculated from Fructosamine is 6.00%. 03/10/2017: HbA1c calculated from Fructosamine is 6.17% (great!). 11/2016: HbA1c calculated from the fructosamine is radically better, at 5.77%! Lab Results  Component Value Date   HGBA1C 8.6 08/31/2017   HGBA1C 9.5 (H) 09/29/2016   HGBA1C 9.3 (H) 09/28/2016   She is on: - Metformin 1000 mg with dinner >> 500 mg daily  - Jardiance 10 mg before b'fast -  started 02/2017 >> she stopped before last visit due to ago b/c UTI and also had a tear on her labia and was afraid that this could have gotten infected if she continued with Jardiance.  However this is  healed.  At last visit.  We restarted Jardiance Januvia was not covered..  She checks sugars 1-2 X a day: - am: 130-132 >> 101-122 >> 127-140 - lunch: n/c - after lunch:  135-147, 152 >> n/c - dinner: n/c - after dinner:130-149 >> 160-170, 188 Highest: 188  She quit smoking in 05/2015.  ROS: Constitutional: no weight gain/no weight loss, no fatigue, no subjective hyperthermia, no subjective hypothermia Eyes: no blurry vision, no xerophthalmia ENT: no sore throat, + see HPI Cardiovascular: no CP/no SOB/no palpitations/no leg swelling Respiratory: no cough/no SOB/no wheezing Gastrointestinal: no N/no V/no D/no C/no acid reflux Musculoskeletal: no muscle aches/no joint aches Skin: no rashes, no hair loss Neurological: no tremors/no  numbness/no tingling/no dizziness  I reviewed pt's medications, allergies, PMH, social hx, family hx, and changes were documented in the history of present illness. Otherwise, unchanged from my initial visit note.  PE: There were no vitals taken for this visit. There is no height or weight on file to calculate BMI. Wt Readings from Last 3 Encounters:  08/31/17 (!) 331 lb 6.4 oz (150.3 kg)  08/31/17 (!) 331 lb 8 oz (150.4 kg)  05/14/17 (!) 319 lb (144.7 kg)   Constitutional: overweight, in NAD Eyes: PERRLA, EOMI, no exophthalmos ENT: moist mucous membranes, no thyromegaly, no cervical lymphadenopathy Cardiovascular: RRR, No MRG Respiratory: CTA B Gastrointestinal: abdomen soft, NT, ND, BS+ Musculoskeletal: no deformities, strength intact in all 4 Skin: moist, warm,  + stasis dermatitis bilateral legs Neurological: no tremor with outstretched hands, DTR normal in all 4  ASSESSMENT: 1. Hypothyroidism  2. PCOS  Testosterone was high >> (2015) indicating PCOS  Component     Latest Ref Rng 10/18/2013  Testosterone     10 - 70 ng/dL 78 (H)  Sex Hormone Binding     18 - 114 nmol/L 28  Testosterone Free     0.6 - 6.8 pg/mL 15.6 (H)  Testosterone-% Free     0.4 - 2.4 % 2.0  Androstenedione      93  DHEA-SO4     35 - 430 ug/dL 35   She has been tested for Cushing's sd. In 02/2012 by a Dexamethasone suppression test >> cortisol returned at 1.4 (normal). We also r/o Cushing's sd. 09/2013 by a normal 24h UFC: Component     Latest Ref Rng 10/25/2013  Cortisol (Ur), Free     4.0 - 50.0 mcg/24 h 26.4  Results received     0.63 - 2.50 g/24 h 1.83  Creatinine, Urine      59.9  Creatinine, 24H Ur     700 - 1800 mg/day 1798   She also had normal urine CA and MN (testing for 2nd HTN) >> no pheochromocytoma.  3. DM2   4. Obesity  PLAN:  1. Patient with   long-standing hypothyroidism, on levothyroxine therapy - latest thyroid labs reviewed with pt >> normal 04/2017. - she  continues on LT4 262 mcg daily - pt feels good on this dose. - we discussed about taking the thyroid hormone every day, with water, >30 minutes before breakfast, separated by >4 hours from acid reflux medications, calcium, iron, multivitamins. Pt. is taking it correctly. - will check thyroid tests today: TSH and fT4 - If labs are abnormal, she will need to return for repeat TFTs in 1.5 months  2. PCOS - Off her hormonal IUD now as she reached the age of menopause  - We started spironolactone in 11/2016, but her hair on face has increased  and she had alopecia and she would like to restart this.  We have to be careful with hyperkalemia. - We will check a potassium level now.  3. DM2 -In the past, sugars improved after we added Jardiance.  She also lost weight and felt very well on it, however, she developed muscle cramps and she started to take potassium.  She also had to come off Lasix due to increased urination.  At last visit, sugars were higher off the Jardiance and we discussed about restarting it. - We will check a BMP today to check her potassium and GFR - I suggested:  Patient Instructions  Please continue: - Metformin ER 500 mg with dinner - Jardiance 10 mg before breakfast  Please continue Levothyroxine 262 112+150 () g daily  Take the thyroid hormone every day, with water, at least 30 minutes before breakfast, separated by at least 4 hours from: - acid reflux medications - calcium - iron - multivitamins  Please return in 4 months with your sugar log.   4. Obesity - Continue Jardiance which should help with weight gain, also  Philemon Kingdom, MD PhD [facility]

## 2018-01-09 ENCOUNTER — Encounter: Payer: Self-pay | Admitting: Internal Medicine

## 2018-01-11 ENCOUNTER — Other Ambulatory Visit: Payer: Self-pay | Admitting: Internal Medicine

## 2018-01-11 DIAGNOSIS — Z1231 Encounter for screening mammogram for malignant neoplasm of breast: Secondary | ICD-10-CM

## 2018-01-14 ENCOUNTER — Encounter: Payer: Medicare Other | Admitting: Internal Medicine

## 2018-01-21 ENCOUNTER — Ambulatory Visit (INDEPENDENT_AMBULATORY_CARE_PROVIDER_SITE_OTHER): Payer: Medicare Other | Admitting: Internal Medicine

## 2018-01-21 ENCOUNTER — Other Ambulatory Visit (INDEPENDENT_AMBULATORY_CARE_PROVIDER_SITE_OTHER): Payer: Medicare Other

## 2018-01-21 ENCOUNTER — Encounter: Payer: Self-pay | Admitting: Internal Medicine

## 2018-01-21 VITALS — BP 124/76 | HR 61 | Temp 98.4°F | Ht 62.0 in | Wt 320.0 lb

## 2018-01-21 DIAGNOSIS — E1165 Type 2 diabetes mellitus with hyperglycemia: Secondary | ICD-10-CM

## 2018-01-21 DIAGNOSIS — Z0000 Encounter for general adult medical examination without abnormal findings: Secondary | ICD-10-CM | POA: Diagnosis not present

## 2018-01-21 LAB — CBC WITH DIFFERENTIAL/PLATELET
BASOS PCT: 1.3 % (ref 0.0–3.0)
Basophils Absolute: 0.1 10*3/uL (ref 0.0–0.1)
EOS PCT: 2.8 % (ref 0.0–5.0)
Eosinophils Absolute: 0.3 10*3/uL (ref 0.0–0.7)
HCT: 47.8 % — ABNORMAL HIGH (ref 36.0–46.0)
Hemoglobin: 16.1 g/dL — ABNORMAL HIGH (ref 12.0–15.0)
LYMPHS ABS: 1.9 10*3/uL (ref 0.7–4.0)
Lymphocytes Relative: 20.2 % (ref 12.0–46.0)
MCHC: 33.8 g/dL (ref 30.0–36.0)
MCV: 91 fl (ref 78.0–100.0)
MONO ABS: 0.6 10*3/uL (ref 0.1–1.0)
MONOS PCT: 6.8 % (ref 3.0–12.0)
Neutro Abs: 6.4 10*3/uL (ref 1.4–7.7)
Neutrophils Relative %: 68.9 % (ref 43.0–77.0)
Platelets: 202 10*3/uL (ref 150.0–400.0)
RBC: 5.25 Mil/uL — ABNORMAL HIGH (ref 3.87–5.11)
RDW: 12.7 % (ref 11.5–15.5)
WBC: 9.2 10*3/uL (ref 4.0–10.5)

## 2018-01-21 LAB — HEPATIC FUNCTION PANEL
ALBUMIN: 4 g/dL (ref 3.5–5.2)
ALT: 23 U/L (ref 0–35)
AST: 25 U/L (ref 0–37)
Alkaline Phosphatase: 61 U/L (ref 39–117)
BILIRUBIN TOTAL: 0.6 mg/dL (ref 0.2–1.2)
Bilirubin, Direct: 0.1 mg/dL (ref 0.0–0.3)
Total Protein: 7.7 g/dL (ref 6.0–8.3)

## 2018-01-21 LAB — URINALYSIS, ROUTINE W REFLEX MICROSCOPIC
Bilirubin Urine: NEGATIVE
Hgb urine dipstick: NEGATIVE
KETONES UR: NEGATIVE
NITRITE: NEGATIVE
PH: 5.5 (ref 5.0–8.0)
RBC / HPF: NONE SEEN (ref 0–?)
Specific Gravity, Urine: 1.01 (ref 1.000–1.030)
Total Protein, Urine: NEGATIVE
UROBILINOGEN UA: 0.2 (ref 0.0–1.0)

## 2018-01-21 LAB — BASIC METABOLIC PANEL
BUN: 17 mg/dL (ref 6–23)
CALCIUM: 9.6 mg/dL (ref 8.4–10.5)
CO2: 33 mEq/L — ABNORMAL HIGH (ref 19–32)
Chloride: 94 mEq/L — ABNORMAL LOW (ref 96–112)
Creatinine, Ser: 0.69 mg/dL (ref 0.40–1.20)
GFR: 94.92 mL/min (ref >60.00)
GLUCOSE: 175 mg/dL — AB (ref 70–99)
POTASSIUM: 4 meq/L (ref 3.5–5.1)
SODIUM: 138 meq/L (ref 135–145)

## 2018-01-21 LAB — LIPID PANEL
CHOLESTEROL: 163 mg/dL (ref 0–200)
HDL: 32.1 mg/dL — AB (ref >39.00)
NonHDL: 131.32
TRIGLYCERIDES: 273 mg/dL — AB (ref 0.0–149.0)
Total CHOL/HDL Ratio: 5
VLDL: 54.6 mg/dL — ABNORMAL HIGH (ref 0.0–40.0)

## 2018-01-21 LAB — TSH: TSH: 0.26 u[IU]/mL — ABNORMAL LOW (ref 0.35–4.50)

## 2018-01-21 LAB — LDL CHOLESTEROL, DIRECT: Direct LDL: 87 mg/dL

## 2018-01-21 NOTE — Patient Instructions (Addendum)
Please continue all other medications as before, and refills have been done if requested.  Please have the pharmacy call with any other refills you may need.  Please continue your efforts at being more active, low cholesterol diet, and weight control.  You are otherwise up to date with prevention measures today.  Please keep your appointments with your specialists as you may have planned - Endocrinology in September  Please go to the LAB in the Basement (turn left off the elevator) for the tests to be done today  You will be contacted by phone if any changes need to be made immediately.  Otherwise, you will receive a letter about your results with an explanation, but please check with MyChart first.  Please remember to sign up for MyChart if you have not done so, as this will be important to you in the future with finding out test results, communicating by private email, and scheduling acute appointments online when needed.  Please return in 1 year for your yearly visit, or sooner if needed, with Lab testing done 3-5 days before

## 2018-01-21 NOTE — Progress Notes (Signed)
Subjective:    Patient ID: Autumn Massey, female    DOB: 14-Jul-1965, 52 y.o.   MRN: [id number]  HPI  Here for wellness and f/u;  Overall doing ok;  Pt denies Chest pain, worsening SOB, DOE, wheezing, orthopnea, PND, worsening LE edema, palpitations, dizziness or syncope.  Pt denies neurological change such as new headache, facial or extremity weakness.  Pt denies polydipsia, polyuria, or low sugar symptoms. Pt states overall good compliance with treatment and medications, good tolerability, and has been trying to follow appropriate diet.  Pt denies worsening depressive symptoms, suicidal ideation or panic. No fever, night sweats, wt loss, loss of appetite, or other constitutional symptoms.  Pt states good ability with ADL's, has low fall risk, home safety reviewed and adequate, no other significant changes in hearing or vision, and only occasionally active with exercise. Having some success with wt loss with better diet and more activity with home excercises, also on new jardiance.Did have yeast infection once on med and tx per GYN..   Plans to start at the gym when she gets under 300. Still taking the lasix qod, and now worsening swelling. 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  Having some sedation with lyrica50 bid so only taking in the evening.   Wt Readings from Last 3 Encounters:  01/21/18 (!) 320 lb (145.2 kg)  08/31/17 (!) 331 lb 6.4 oz (150.3 kg)  08/31/17 (!) 331 lb 8 oz (150.4 kg)  Plans to call soon for eye doctor f/u, as well as GYN.  DID have abnormal urine cx with ecoli in mar in conjunction with the yeast infection but did not require treatment.  Denies urinary symptoms such as dysuria, urgency, flank pain, hematuria or n/v, fever, chills, though has some frequency on the jardiance. Past Medical History:  Diagnosis Date  . ANXIETY DEPRESSION 01/29/2009  . DEPRESSION 11/19/2007  . Diabetes ([place])   . FREQUENCY,  URINARY 10/17/2009  . HYPERLIPIDEMIA 11/19/2007  . HYPERTENSION 11/19/2007  . HYPOTHYROIDISM 11/19/2007  . Morbid obesity ([place])   . PNEUMONIA 08/06/2010  . POLYCYTHEMIA 01/29/2009  . Preventative health care 04/06/2011  . Sleep apnea    mild sleep apnea, on o2 at 2l at nighttime  . SLEEP RELATED HYPOVENTILATION/HYPOXEMIA CCE 02/08/2009  . Venous insufficiency 07/19/2012   Past Surgical History:  Procedure Laterality Date  . TONSILLECTOMY    . ULNAR TUNNEL RELEASE Left 02/22/2013   Procedure: LEFT ULNAR NERVE DECOMPRESSION ;  Surgeon: Tennis Must, MD;  Location: [facility];  Service: Orthopedics;  Laterality: Left;    reports that she quit smoking about 2 years ago. Her smoking use included cigarettes. She has a 11.00 pack-year smoking history. She has never used smokeless tobacco. She reports that she drinks alcohol. She reports that she does not use drugs. family history includes Alcohol abuse in her other; Arthritis in her other; Cancer in her maternal grandfather and mother; Hypertension in her other. Allergies  Allergen Reactions  . Cymbalta [Duloxetine Hcl]    Current Outpatient Medications on File Prior to Visit  Medication Sig Dispense Refill  . acetaminophen (TYLENOL) 500 MG tablet Take 1,000 mg by mouth every 6 (six) hours as needed for mild pain, moderate pain, fever or headache.     . albuterol (PROAIR HFA) 108 (90 Base) MCG/ACT inhaler 2 puffs every 4 hours as needed only  if your can't catch your breath 1 Inhaler 5  .  albuterol (PROVENTIL) (2.5 MG/3ML) 0.083% nebulizer solution Take 3 mLs (2.5 mg total) by nebulization every 6 (six) hours as needed for wheezing or shortness of breath. 75 mL 0  . aspirin EC 81 MG tablet Take 81 mg by mouth daily.    . bisoprolol-hydrochlorothiazide (ZIAC) 5-6.25 MG tablet TAKE 1 TABLET BY MOUTH  DAILY 90 tablet 0  . clonazePAM (KLONOPIN) 0.5 MG tablet Take 1 tablet (0.5 mg total) by mouth 2 (two) times daily as needed for anxiety. 60  tablet 5  . cyclobenzaprine (FLEXERIL) 5 MG tablet TAKE 1 TABLET BY MOUTH 3 TIMES DAILY AS NEEDED FOR MUSCLE SPASMS 270 tablet 1  . FLUoxetine (PROZAC) 20 MG capsule TAKE 1 CAPSULE BY MOUTH  ONCE DAILY 90 capsule 3  . fluticasone furoate-vilanterol (BREO ELLIPTA) 100-25 MCG/INH AEPB Inhale 1 puff into the lungs every morning. 28 each 11  . furosemide (LASIX) 40 MG tablet TAKE 1 TABLET BY MOUTH  EVERY OTHER DAY AS NEEDED. 45 tablet 3  . GARLIC PO Take 1 tablet by mouth daily.    . irbesartan (AVAPRO) 75 MG tablet Take 1 tablet (75 mg total) by mouth at bedtime. 90 tablet 3  . JARDIANCE 10 MG TABS tablet TAKE 1 TABLET BY MOUTH EVERY DAY 30 tablet 4  . levothyroxine (SYNTHROID, LEVOTHROID) 112 MCG tablet TAKE 1 TABLET BY MOUTH  DAILY BEFORE BREAKFAST.  TAKE ALONG WITH 150 MCG. 90 tablet 3  . levothyroxine (SYNTHROID, LEVOTHROID) 150 MCG tablet TAKE 1 TABLET BY MOUTH  DAILY. TAKE ALONG WITH THE  112 MCG TABLET FOR A TOTAL  OF 262 MCG DAILY. 90 tablet 3  . LYRICA 50 MG capsule TAKE ONE CAPSULE TWICE A DAY 60 capsule 5  . metFORMIN (GLUCOPHAGE-XR) 500 MG 24 hr tablet Take 1 tablet (500 mg total) daily with breakfast by mouth. 90 tablet 3  . montelukast (SINGULAIR) 10 MG tablet TAKE 1 TABLET BY MOUTH AT  BEDTIME 90 tablet 0  . OXYGEN 2lpm with sleep only  [facility]    . potassium chloride SA (K-DUR,KLOR-CON) 20 MEQ tablet Take 1 tablet (20 mEq total) by mouth daily. 30 tablet 3  . primidone (MYSOLINE) 50 MG tablet Take 3 tablets (150 mg total) by mouth at bedtime. 90 tablet 5  . simvastatin (ZOCOR) 40 MG tablet TAKE 1 TABLET BY MOUTH AT  BEDTIME 30 tablet 0  . simvastatin (ZOCOR) 40 MG tablet TAKE 1 TABLET BY MOUTH AT  BEDTIME 90 tablet 0  . simvastatin (ZOCOR) 40 MG tablet TAKE 1 TABLET BY MOUTH AT  BEDTIME DAILY 90 tablet 0  . Vitamin D, Ergocalciferol, (DRISDOL) 50000 units CAPS capsule TAKE 1 CAPSULE (50,000 UNITS TOTAL) BY MOUTH EVERY 7 (SEVEN) DAYS. 12 capsule 0   No current facility-administered  medications on file prior to visit.    Review of Systems Constitutional: Negative for other unusual diaphoresis, sweats, appetite or weight changes HENT: Negative for other worsening hearing loss, ear pain, facial swelling, mouth sores or neck stiffness.   Eyes: Negative for other worsening pain, redness or other visual disturbance.  Respiratory: Negative for other stridor or swelling Cardiovascular: Negative for other palpitations or other chest pain  Gastrointestinal: Negative for worsening diarrhea or loose stools, blood in stool, distention or other pain Genitourinary: Negative for hematuria, flank pain or other change in urine volume.  Musculoskeletal: Negative for myalgias or other joint swelling.  Skin: Negative for other color change, or other wound or worsening drainage.  Neurological: Negative for other syncope or  numbness. Hematological: Negative for other adenopathy or swelling Psychiatric/Behavioral: Negative for hallucinations, other worsening agitation, SI, self-injury, or new decreased concentration All other system neg per pt    Objective:   Physical Exam BP 124/76   Pulse 61   Temp 98.4 F (36.9 C) (Oral)   Ht 5\' 2"  (1.575 m)   Wt (!) 320 lb (145.2 kg)   SpO2 93%   BMI 58.53 kg/m  VS noted,  Constitutional: Pt is oriented to person, place, and time. Appears well-developed and well-nourished, in no significant distress and comfortable Head: Normocephalic and atraumatic  Eyes: Conjunctivae and EOM are normal. Pupils are equal, round, and reactive to light Right Ear: External ear normal without discharge Left Ear: External ear normal without discharge Nose: Nose without discharge or deformity Mouth/Throat: Oropharynx is without other ulcerations and moist  Neck: Normal range of motion. Neck supple. No JVD present. No tracheal deviation present or significant neck LA or mass Cardiovascular: Normal rate, regular rhythm, normal heart sounds and intact distal pulses.     Pulmonary/Chest: WOB normal and breath sounds without rales or wheezing  Abdominal: Soft. Bowel sounds are normal. NT. No HSM  Musculoskeletal: Normal range of motion. Exhibits no edema Lymphadenopathy: Has no other cervical adenopathy.  Neurological: Pt is alert and oriented to person, place, and time. Pt has normal reflexes. No cranial nerve deficit. Motor grossly intact, Gait intact Skin: Skin is warm and dry. No rash noted or new ulcerations Psychiatric:  Has normal mood and affect. Behavior is normal without agitation No other exam findings Lab Results  Component Value Date   WBC 9.2 01/21/2018   HGB 16.1 (H) 01/21/2018   HCT 47.8 (H) 01/21/2018   PLT 202.0 01/21/2018   GLUCOSE 175 (H) 01/21/2018   CHOL 163 01/21/2018   TRIG 273.0 (H) 01/21/2018   HDL 32.10 (L) 01/21/2018   LDLDIRECT 87.0 01/21/2018   LDLCALC 65 10/15/2016   ALT 23 01/21/2018   AST 25 01/21/2018   NA 138 01/21/2018   K 4.0 01/21/2018   CL 94 (L) 01/21/2018   CREATININE 0.69 01/21/2018   BUN 17 01/21/2018   CO2 33 (H) 01/21/2018   TSH 0.26 (L) 01/21/2018   HGBA1C 8.6 08/31/2017   MICROALBUR 0.9 11/22/2014       Assessment & Plan:

## 2018-01-22 ENCOUNTER — Encounter: Payer: Self-pay | Admitting: Internal Medicine

## 2018-01-22 NOTE — Assessment & Plan Note (Signed)

## 2018-01-22 NOTE — Assessment & Plan Note (Addendum)
stable overall by history and exam, recent data reviewed with pt, and pt to continue medical treatment as before,  to f/u any worsening symptoms or concerns, cont wt loss and diet control, and f/u endo as planned Lab Results  Component Value Date   HGBA1C 8.6 08/31/2017

## 2018-02-06 ENCOUNTER — Other Ambulatory Visit: Payer: Self-pay | Admitting: Internal Medicine

## 2018-02-14 ENCOUNTER — Other Ambulatory Visit: Payer: Self-pay | Admitting: Internal Medicine

## 2018-03-02 ENCOUNTER — Encounter: Payer: Self-pay | Admitting: Internal Medicine

## 2018-03-02 ENCOUNTER — Ambulatory Visit: Payer: Medicare Other | Admitting: Internal Medicine

## 2018-03-02 ENCOUNTER — Ambulatory Visit
Admission: RE | Admit: 2018-03-02 | Discharge: 2018-03-02 | Disposition: A | Discharge status: Home / Self Care | Payer: Medicare Other | Source: Ambulatory Visit | Attending: Internal Medicine | Admitting: Internal Medicine

## 2018-03-02 ENCOUNTER — Encounter

## 2018-03-02 VITALS — BP 110/60 | HR 80 | Ht 62.0 in | Wt 316.8 lb

## 2018-03-02 DIAGNOSIS — E039 Hypothyroidism, unspecified: Secondary | ICD-10-CM | POA: Diagnosis not present

## 2018-03-02 DIAGNOSIS — Z23 Encounter for immunization: Secondary | ICD-10-CM | POA: Diagnosis not present

## 2018-03-02 DIAGNOSIS — E1165 Type 2 diabetes mellitus with hyperglycemia: Secondary | ICD-10-CM

## 2018-03-02 DIAGNOSIS — Z1231 Encounter for screening mammogram for malignant neoplasm of breast: Secondary | ICD-10-CM | POA: Diagnosis not present

## 2018-03-02 DIAGNOSIS — E282 Polycystic ovarian syndrome: Secondary | ICD-10-CM | POA: Diagnosis not present

## 2018-03-02 LAB — T4, FREE: Free T4: 1.32 ng/dL (ref 0.60–1.60)

## 2018-03-02 LAB — TSH: TSH: 0.16 u[IU]/mL — AB (ref 0.35–4.50)

## 2018-03-02 MED ORDER — SPIRONOLACTONE 25 MG PO TABS: 25.0000 mg | ORAL_TABLET | Freq: Two times a day (BID) | ORAL | 5 refills | Status: DC

## 2018-03-02 NOTE — Patient Instructions (Addendum)
Please continue: - Metformin ER 500 mg with dinner - Jardiance 10 mg before breakfast  Please continue Levothyroxine 262 (112+150) g daily  Take the thyroid hormone every day, with water, at least 30 minutes before breakfast, separated by at least 4 hours from: - acid reflux medications - calcium - iron - multivitamins  Please stop at the lab.  Start Spironolactone 25 mg daily x 3 days, then increase to 25 mg 2x a day.  Please look up: Rip Esselstyn - The Engine 2 diet   Rip Esselstyn - The Engine 2 7-day Rescue diet  Please return in 6 months.

## 2018-03-02 NOTE — Progress Notes (Signed)
Patient ID: Autumn Massey, female   DOB: 1966/04/05, 52 y.o.   MRN: [id number]   HPI  Autumn Massey is a 51 y.o.-year-old female, returning for f/u for DM2, hypothyroidism, and PCOS. Last visit 6 months ago On disability now.  She started to change her diet since last visit: more veggies, less fried foods, highly processed foods, starches.  She lost 15 pounds and she is feeling much better.  She is planning to also start exercise after she loses approximately 15 more pounds.  PCOS: Reviewed history:  - weight gain: Maximal weight 364  - was on Phentermine, which worked initially, then stopped working  - Would consider gastric bypass but only as a last resort - + hirsutism >> started to see facial hair in her 30s - + hair loss >> female pattern, and bald spot in vertex - No acne - had a Mirena IUD placed 04/2013 for heavy menstrual cycles >> out now; no menses since IUD out - has frequent Prednisone tapers   She was previously on spironolactone 50 mg twice daily but her potassium increased so we had to decrease the dose to 25 mg twice daily.  We ended up stopping spironolactone 11/2016.  However, at last visit, she was complaining of more facial hair and more frontal balding and we discussed about restarting spironolactone at this visit.  Hypothyroidism: Pt. has been dx with hypothyroidism in 2005  Pt is on levothyroxine 262 (150+112) mcg daily, taken: - in am - fasting - at least 30 min from b'fast - no Ca, Fe, MVI, PPIs - not on Biotin  Reviewed patient's TFTs: Lab Results  Component Value Date   TSH 0.26 (L) 01/21/2018   TSH 1.29 05/05/2017   TSH 5.30 (H) 03/10/2017   TSH 8.74 (H) 12/01/2016   TSH 14.632 (H) 10/15/2016   TSH 9.96 (H) 04/22/2016   TSH 2.25 07/23/2015   TSH 1.56 11/22/2014   TSH 1.692 05/24/2014   TSH 0.33 (L) 03/08/2014   FREET4 1.25 05/05/2017   FREET4 1.02 03/10/2017   FREET4 0.86 12/01/2016   FREET4 1.05 10/15/2016   FREET4 0.77 04/22/2016   FREET4  1.08 07/23/2015   FREET4 0.91 11/22/2014   FREET4 1.21 05/24/2014   FREET4 1.05 03/08/2014   FREET4 1.38 01/27/2014    Pt denies: - feeling nodules in neck - hoarseness - dysphagia - choking - SOB with lying down  She was on Neurontin for tremors, but had to decrease the dose because of somnolence and then she was able to stop. She is on primidone >> restarted by Dr. Tomi Likens.  She is also on Lyrica at night.  DM2:  Reviewed HbA1c: 09/02/2017: HbA1c calculated from Fructosamine is 6.1%. 05/05/2017: HbA1c calculated from Fructosamine is 6.00%. 03/10/2017: HbA1c calculated from Fructosamine is 6.17% (great!). 11/2016: HbA1c calculated from the fructosamine is radically better, at 5.77%! Lab Results  Component Value Date   HGBA1C 8.6 08/31/2017   HGBA1C 9.5 (H) 09/29/2016   HGBA1C 9.3 (H) 09/28/2016   She is on: - Metformin 1000 mg with dinner >> 500 mg with dinner  - Jardiance 10 mg before b'fast -  started 02/2017 >> stopped b/c UTI and also had a tear on her labia and was afraid that this could have gotten infected if she continued with Jardiance.  This is now healing.  However, she still has dysuria and wonders if we can check a urinalysis at this visit.  She has more leg swelling since she stopped Jardiance and would like  to restart it.  Not taking Januvia >> not covered.  On Lasix qod.  She checks sugars 1-2 times a day: - am: 130-132 >> 101-122 >> 127-140 >> 124 (lately)-134, 140 - lunch: n/c - after lunch:  135-147, 152 >> n/c >> 145-150 - dinner: n/c >> 120-140, 157 - after dinner:130-149 >> 160-170, 188 >> 140-150 Highest: 188 >> 157  She quit smoking 05/2015.  On Vitamin D 50,000 units weekly.  ROS: + See HPI Constitutional: no weight gain/no weight loss, no fatigue, no subjective hyperthermia, no subjective hypothermia Eyes: no blurry vision, no xerophthalmia ENT: no sore throat, + see HPI Cardiovascular: no CP/no SOB/no palpitations/no leg  swelling Respiratory: no cough/no SOB/no wheezing Gastrointestinal: no N/no V/no D/no C/no acid reflux Musculoskeletal: no muscle aches/no joint aches Skin: no rashes, + hair loss Neurological: + Tremors/no numbness/no tingling/no dizziness  I reviewed pt's medications, allergies, PMH, social hx, family hx, and changes were documented in the history of present illness. Otherwise, unchanged from my initial visit note.  Past Medical History:  Diagnosis Date  . ANXIETY DEPRESSION 01/29/2009  . DEPRESSION 11/19/2007  . Diabetes ([place])   . FREQUENCY, URINARY 10/17/2009  . HYPERLIPIDEMIA 11/19/2007  . HYPERTENSION 11/19/2007  . HYPOTHYROIDISM 11/19/2007  . Morbid obesity ([place])   . PNEUMONIA 08/06/2010  . POLYCYTHEMIA 01/29/2009  . Preventative health care 04/06/2011  . Sleep apnea    mild sleep apnea, on o2 at 2l at nighttime  . SLEEP RELATED HYPOVENTILATION/HYPOXEMIA CCE 02/08/2009  . Venous insufficiency 07/19/2012   Past Surgical History:  Procedure Laterality Date  . TONSILLECTOMY    . ULNAR TUNNEL RELEASE Left 02/22/2013   Procedure: LEFT ULNAR NERVE DECOMPRESSION ;  Surgeon: Tennis Must, MD;  Location: [facility];  Service: Orthopedics;  Laterality: Left;   Social History   Socioeconomic History  . Marital status: Divorced    Spouse name: Not on file  . Number of children: 0  . Years of education: 28  . Highest education level: Not on file  Occupational History  . Occupation: Disabled    Employer: [employer]  Social Needs  . Financial resource strain: Not on file  . Food insecurity:    Worry: Not on file    Inability: Not on file  . Transportation needs:    Medical: Not on file    Non-medical: Not on file  Tobacco Use  . Smoking status: Former Smoker    Packs/day: 0.50    Years: 22.00    Pack years: 11.00    Types: Cigarettes    Last attempt to quit: 05/22/2015    Years since quitting: 2.7  . Smokeless tobacco: Never Used  Substance and Sexual  Activity  . Alcohol use: Yes    Alcohol/week: 0.0 standard drinks    Comment: rare  . Drug use: No  . Sexual activity: Never    Birth control/protection: IUD  Lifestyle  . Physical activity:    Days per week: Not on file    Minutes per session: Not on file  . Stress: Not on file  Relationships  . Social connections:    Talks on phone: Not on file    Gets together: Not on file    Attends religious service: Not on file    Active member of club or organization: Not on file    Attends meetings of clubs or organizations: Not on file    Relationship status: Not on file  . Intimate partner violence:  Fear of current or ex partner: Not on file    Emotionally abused: Not on file    Physically abused: Not on file    Forced sexual activity: Not on file  Other Topics Concern  . Not on file  Social History Narrative  . Not on file   Current Outpatient Medications on File Prior to Visit  Medication Sig Dispense Refill  . acetaminophen (TYLENOL) 500 MG tablet Take 1,000 mg by mouth every 6 (six) hours as needed for mild pain, moderate pain, fever or headache.     . albuterol (PROAIR HFA) 108 (90 Base) MCG/ACT inhaler 2 puffs every 4 hours as needed only  if your can't catch your breath 1 Inhaler 5  . albuterol (PROVENTIL) (2.5 MG/3ML) 0.083% nebulizer solution Take 3 mLs (2.5 mg total) by nebulization every 6 (six) hours as needed for wheezing or shortness of breath. 75 mL 0  . aspirin EC 81 MG tablet Take 81 mg by mouth daily.    . bisoprolol-hydrochlorothiazide (ZIAC) 5-6.25 MG tablet TAKE 1 TABLET BY MOUTH  DAILY 90 tablet 3  . BREO ELLIPTA 100-25 MCG/INH AEPB INHALE 1 PUFF INTO THE LUNGS EVERY MORNING. 60 each 0  . clonazePAM (KLONOPIN) 0.5 MG tablet Take 1 tablet (0.5 mg total) by mouth 2 (two) times daily as needed for anxiety. 60 tablet 5  . cyclobenzaprine (FLEXERIL) 5 MG tablet TAKE 1 TABLET BY MOUTH 3 TIMES DAILY AS NEEDED FOR MUSCLE SPASMS 270 tablet 1  . FLUoxetine (PROZAC) 20  MG capsule TAKE 1 CAPSULE BY MOUTH  ONCE DAILY 90 capsule 3  . furosemide (LASIX) 40 MG tablet TAKE 1 TABLET BY MOUTH  EVERY OTHER DAY AS NEEDED. 45 tablet 3  . GARLIC PO Take 1 tablet by mouth daily.    . irbesartan (AVAPRO) 75 MG tablet Take 1 tablet (75 mg total) by mouth at bedtime. 90 tablet 3  . JARDIANCE 10 MG TABS tablet TAKE 1 TABLET BY MOUTH EVERY DAY 30 tablet 4  . levothyroxine (SYNTHROID, LEVOTHROID) 112 MCG tablet TAKE 1 TABLET BY MOUTH  DAILY BEFORE BREAKFAST.  TAKE ALONG WITH 150 MCG. 90 tablet 3  . levothyroxine (SYNTHROID, LEVOTHROID) 150 MCG tablet TAKE 1 TABLET BY MOUTH  DAILY. TAKE ALONG WITH THE  112 MCG TABLET FOR A TOTAL  OF 262 MCG DAILY. 90 tablet 3  . LYRICA 50 MG capsule TAKE ONE CAPSULE TWICE A DAY 60 capsule 5  . metFORMIN (GLUCOPHAGE-XR) 500 MG 24 hr tablet Take 1 tablet (500 mg total) daily with breakfast by mouth. 90 tablet 3  . montelukast (SINGULAIR) 10 MG tablet TAKE 1 TABLET BY MOUTH AT  BEDTIME 90 tablet 3  . OXYGEN 2lpm with sleep only  [facility]    . potassium chloride SA (K-DUR,KLOR-CON) 20 MEQ tablet Take 1 tablet (20 mEq total) by mouth daily. 30 tablet 3  . primidone (MYSOLINE) 50 MG tablet Take 3 tablets (150 mg total) by mouth at bedtime. 90 tablet 5  . simvastatin (ZOCOR) 40 MG tablet TAKE 1 TABLET BY MOUTH AT  BEDTIME 30 tablet 0  . simvastatin (ZOCOR) 40 MG tablet TAKE 1 TABLET BY MOUTH AT  BEDTIME 90 tablet 0  . simvastatin (ZOCOR) 40 MG tablet TAKE 1 TABLET BY MOUTH AT  BEDTIME DAILY 90 tablet 0  . simvastatin (ZOCOR) 40 MG tablet TAKE 1 TABLET BY MOUTH AT  BEDTIME DAILY 90 tablet 3  . Vitamin D, Ergocalciferol, (DRISDOL) 50000 units CAPS capsule TAKE 1 CAPSULE (50,000  UNITS TOTAL) BY MOUTH EVERY 7 (SEVEN) DAYS. 12 capsule 0   No current facility-administered medications on file prior to visit.    Allergies  Allergen Reactions  . Cymbalta [Duloxetine Hcl]    Family History  Problem Relation Age of Onset  . Cancer Mother        melanoma   .  Cancer Maternal Grandfather        prostate cancer  . Alcohol abuse Other   . Arthritis Other   . Hypertension Other     PE: BP 110/60   Pulse 80   Ht 5\' 2"  (1.575 m)   Wt (!) 316 lb 12.8 oz (143.7 kg)   LMP  (LMP Unknown)   SpO2 94%   BMI 57.94 kg/m  There is no height or weight on file to calculate BMI. Wt Readings from Last 3 Encounters:  03/02/18 (!) 316 lb 12.8 oz (143.7 kg)  01/21/18 (!) 320 lb (145.2 kg)  08/31/17 (!) 331 lb 6.4 oz (150.3 kg)   Constitutional: overweight, in NAD Eyes: PERRLA, EOMI, no exophthalmos ENT: moist mucous membranes, no thyromegaly, no cervical lymphadenopathy Cardiovascular: RRR, No MRG Respiratory: CTA B Gastrointestinal: abdomen soft, NT, ND, BS+ Musculoskeletal: no deformities, strength intact in all 4 Skin: moist, warm, + stasis dermatitis bilateral legs Neurological: no tremor with outstretched hands, DTR normal in all 4  ASSESSMENT: 1. Hypothyroidism  2. PCOS  Testosterone was high >> (2015) indicating PCOS  Component     Latest Ref Rng 10/18/2013  Testosterone     10 - 70 ng/dL 78 (H)  Sex Hormone Binding     18 - 114 nmol/L 28  Testosterone Free     0.6 - 6.8 pg/mL 15.6 (H)  Testosterone-% Free     0.4 - 2.4 % 2.0  Androstenedione      93  DHEA-SO4     35 - 430 ug/dL 35    She has been tested for Cushing's sd. In 02/2012 by a Dexamethasone suppression test >> cortisol returned at 1.4 (normal). We also r/o Cushing's sd. 09/2013 by a normal 24h UFC: Component     Latest Ref Rng 10/25/2013  Cortisol (Ur), Free     4.0 - 50.0 mcg/24 h 26.4  Results received     0.63 - 2.50 g/24 h 1.83  Creatinine, Urine      59.9  Creatinine, 24H Ur     700 - 1800 mg/day 1798   She also had normal urine CA and MN (testing for 2nd HTN) >> no pheochromocytoma.  3. DM2   4.  Obesity  PLAN:  1. Patient with long-standing hypothyroidism, on levothyroxine therapy - latest thyroid labs reviewed with pt >> TSH slightly suppressed  1.5 months ago - she continues on LT4 262 mcg daily - pt feels good on this dose. - we discussed about taking the thyroid hormone every day, with water, >30 minutes before breakfast, separated by >4 hours from acid reflux medications, calcium, iron, multivitamins. Pt. is taking it correctly. - We will recheck her TFTs today and adjust the dose of levothyroxine accordingly - We discussed that if she continues to lose weight, the dose of levothyroxine will most likely need to be decreased  2. PCOS - She reached the age of menopause - she is off her IUD without having menstrual cycles -Increased hair on face and alopecia -we will restart Spironolactone -at 25 mg for 3 days and then 25 mg twice a day for approximately 5 days  after which she will return to have a BMP.  She is on an SGLT2 inhibitor, and ARB, and now will start spironolactone, which can also increase potassium  3. DM2 -Sugars improved after we added Jardiance in the past, however, she stopped due to muscle cramps.  She developed increased urination and had to stop Lasix.  She also had a labial tear, which has resolved. Sugars were higher after she stopped Jardiance, so at last visit, we added it back, at the low dose of 10 mg before breakfast.  I advised her to stay well-hydrated while on this. - We will check a potassium level today - Given flu shot today - I suggested:  Patient Instructions  Please continue: - Metformin ER 500 mg with dinner - Jardiance 10 mg before breakfast  Please continue Levothyroxine 262 (112+150) g daily  Take the thyroid hormone every day, with water, at least 30 minutes before breakfast, separated by at least 4 hours from: - acid reflux medications - calcium - iron - multivitamins  Please stop at the lab.  Start Spironolactone 25 mg daily x 3 days, then increase to 25 mg 2x a day.  Please look up: Rip Esselstyn - The Engine 2 diet   Rip Esselstyn - The Engine 2 7-day Rescue diet  Please  return in 6 months.   4.  Obesity -She did wonderful since last visit losing weight and adjusting her diet.  She does not consider this an actual diet, but a way of eating.  I advised her that this is the key to succeed in losing weight.  I also made further recommendation about plant-based diets. -Continue Jardiance, which should also help with weight loss  Component     Latest Ref Rng & Units 03/02/2018          Glucose     65 - 99 mg/dL 154 (H)  BUN     7 - 25 mg/dL 17  Creatinine     0.50 - 1.05 mg/dL 0.73  GFR, Est Non African American     > OR = 60 mL/min/1.1m2 95  GFR, Est African American     > OR = 60 mL/min/1.23m2 110  BUN/Creatinine Ratio     6 - 22 (calc) NOT APPLICABLE  Sodium     500 - 146 mmol/L 136  Potassium     3.5 - 5.3 mmol/L 4.0  Chloride     98 - 110 mmol/L 96 (L)  CO2     20 - 32 mmol/L 30  Calcium     8.6 - 10.4 mg/dL 9.5  TSH     0.35 - 4.50 uIU/mL 0.16 (L)  T4,Free(Direct)     0.60 - 1.60 ng/dL 1.32  Fructosamine     190 - 270 umol/L 230   HbA1c calculated from fructosamine is better, at 5.5%! TSH still low >> will need to decrease the dose of her LT4 to 200 mcg daily >> will repeat TFTs in 2 months.  Philemon Kingdom, MD PhD [facility] Endocrinology

## 2018-03-03 LAB — BASIC METABOLIC PANEL WITH GFR
BUN: 17 mg/dL (ref 7–25)
CO2: 30 mmol/L (ref 20–32)
CREATININE: 0.73 mg/dL (ref 0.50–1.05)
Calcium: 9.5 mg/dL (ref 8.6–10.4)
Chloride: 96 mmol/L — ABNORMAL LOW (ref 98–110)
GFR, EST AFRICAN AMERICAN: 110 mL/min/{1.73_m2} (ref >=60)
GFR, EST NON AFRICAN AMERICAN: 95 mL/min/{1.73_m2} (ref >=60)
GLUCOSE: 154 mg/dL — AB (ref 65–99)
POTASSIUM: 4 mmol/L (ref 3.5–5.3)
SODIUM: 136 mmol/L (ref 135–146)

## 2018-03-03 LAB — FRUCTOSAMINE: Fructosamine: 230 umol/L (ref 190–270)

## 2018-03-04 MED ORDER — LEVOTHYROXINE SODIUM 200 MCG PO TABS: ORAL_TABLET | ORAL | 5 refills | Status: DC

## 2018-03-04 NOTE — Progress Notes (Deleted)
NEUROLOGY FOLLOW UP OFFICE NOTE  Autumn Massey [id number]  HISTORY OF PRESENT ILLNESS: Autumn Massey is a 52 year old right-handed female with hypothyroidism, hyperlipidemia, type 2 diabetes mellitus, cerebrovascular disease and former smoker who follows up for essential tremor and polyneuropathy.  UPDATE: She is taking primidone 150mg  at bedtime.  Tremor is ***. She retried Lyrica 50mg  twice daily.  ***  HISTORY: She underwent left ulnar decompression surgery in August 2014. When the cast was removed the following month, she noted twitching of her 5th digit.[name] An MRI of the cervical spine was performed in December 2014, which revealed no structural etiology for radiculopathy. She was evaluated by Dr. Catalina Gravel, a neurologist in [place], who performed a [place] in January 2015. It was essentially a normal exam. She continues to have numbness, paresthesias and weakness of the 4th and 5th digits of the left hand. She later developed tremor in both hands, more noticeable in the right hand since she is right-hand dominant. It occurs when she is writing or holding utensils. She denies gait difficulty, rigidity, lack of sense of smell, or history consistent with REM sleep behavior. She is concerned about Parkinson's disease because her maternal grandfather and uncle had it. Another uncle has tremor.  She has numbness and tingling in the toes and fingers.  She also notes electric pain in the right leg below the knee to the foot. For neuropathic pain, she has tried gabapentin and Cymbalta in the past.  PAST MEDICAL HISTORY: Past Medical History:  Diagnosis Date  . ANXIETY DEPRESSION 01/29/2009  . DEPRESSION 11/19/2007  . Diabetes ([name])   . FREQUENCY, URINARY 10/17/2009  . HYPERLIPIDEMIA 11/19/2007  . HYPERTENSION 11/19/2007  . HYPOTHYROIDISM 11/19/2007  . Morbid obesity ([place])   . PNEUMONIA 08/06/2010  . POLYCYTHEMIA 01/29/2009  . Preventative health care 04/06/2011  . Sleep apnea    mild sleep apnea, on o2 at 2l at nighttime  . SLEEP RELATED HYPOVENTILATION/HYPOXEMIA CCE 02/08/2009  . Venous insufficiency 07/19/2012    MEDICATIONS: Current Outpatient Medications on File Prior to Visit  Medication Sig Dispense Refill  . acetaminophen (TYLENOL) 500 MG tablet Take 1,000 mg by mouth every 6 (six) hours as needed for mild pain, moderate pain, fever or headache.     . albuterol (PROAIR HFA) 108 (90 Base) MCG/ACT inhaler 2 puffs every 4 hours as needed only  if your can't catch your breath 1 Inhaler 5  . albuterol (PROVENTIL) (2.5 MG/3ML) 0.083% nebulizer solution Take 3 mLs (2.5 mg total) by nebulization every 6 (six) hours as needed for wheezing or shortness of breath. 75 mL 0  . aspirin EC 81 MG tablet Take 81 mg by mouth daily.    . bisoprolol-hydrochlorothiazide (ZIAC) 5-6.25 MG tablet TAKE 1 TABLET BY MOUTH  DAILY 90 tablet 3  . BREO ELLIPTA 100-25 MCG/INH AEPB INHALE 1 PUFF INTO THE LUNGS EVERY MORNING. 60 each 0  . clonazePAM (KLONOPIN) 0.5 MG tablet Take 1 tablet (0.5 mg total) by mouth 2 (two) times daily as needed for anxiety. 60 tablet 5  . cyclobenzaprine (FLEXERIL) 5 MG tablet TAKE 1 TABLET BY MOUTH 3 TIMES DAILY AS NEEDED FOR MUSCLE SPASMS 270 tablet 1  . FLUoxetine (PROZAC) 20 MG capsule TAKE 1 CAPSULE BY MOUTH  ONCE DAILY 90 capsule 3  . furosemide (LASIX) 40 MG tablet TAKE 1 TABLET BY MOUTH  EVERY OTHER DAY AS NEEDED. 45 tablet 3  . GARLIC PO Take 1 tablet by mouth daily.    [name]  irbesartan (AVAPRO) 75 MG tablet Take 1 tablet (75 mg total) by mouth at bedtime. 90 tablet 3  . JARDIANCE 10 MG TABS tablet TAKE 1 TABLET BY MOUTH EVERY DAY 30 tablet 4  . levothyroxine (SYNTHROID, LEVOTHROID) 112 MCG tablet TAKE 1 TABLET BY MOUTH  DAILY BEFORE BREAKFAST.  TAKE ALONG WITH 150 MCG. 90 tablet 3  . levothyroxine (SYNTHROID, LEVOTHROID) 150 MCG tablet TAKE 1 TABLET BY MOUTH  DAILY. TAKE ALONG WITH THE  112 MCG TABLET FOR A TOTAL  OF 262 MCG DAILY. 90 tablet 3  . LYRICA 50 MG  capsule TAKE ONE CAPSULE TWICE A DAY 60 capsule 5  . metFORMIN (GLUCOPHAGE-XR) 500 MG 24 hr tablet Take 1 tablet (500 mg total) daily with breakfast by mouth. 90 tablet 3  . montelukast (SINGULAIR) 10 MG tablet TAKE 1 TABLET BY MOUTH AT  BEDTIME 90 tablet 3  . OXYGEN 2lpm with sleep only  [facility]    . potassium chloride SA (K-DUR,KLOR-CON) 20 MEQ tablet Take 1 tablet (20 mEq total) by mouth daily. 30 tablet 3  . primidone (MYSOLINE) 50 MG tablet Take 3 tablets (150 mg total) by mouth at bedtime. 90 tablet 5  . simvastatin (ZOCOR) 40 MG tablet TAKE 1 TABLET BY MOUTH AT  BEDTIME 30 tablet 0  . simvastatin (ZOCOR) 40 MG tablet TAKE 1 TABLET BY MOUTH AT  BEDTIME 90 tablet 0  . simvastatin (ZOCOR) 40 MG tablet TAKE 1 TABLET BY MOUTH AT  BEDTIME DAILY 90 tablet 0  . simvastatin (ZOCOR) 40 MG tablet TAKE 1 TABLET BY MOUTH AT  BEDTIME DAILY 90 tablet 3  . spironolactone (ALDACTONE) 25 MG tablet Take 1 tablet (25 mg total) by mouth 2 (two) times daily. 60 tablet 5  . Vitamin D, Ergocalciferol, (DRISDOL) 50000 units CAPS capsule TAKE 1 CAPSULE (50,000 UNITS TOTAL) BY MOUTH EVERY 7 (SEVEN) DAYS. 12 capsule 0   No current facility-administered medications on file prior to visit.     ALLERGIES: Allergies  Allergen Reactions  . Cymbalta [Duloxetine Hcl]     FAMILY HISTORY: Family History  Problem Relation Age of Onset  . Cancer Mother        melanoma   . Cancer Maternal Grandfather        prostate cancer  . Alcohol abuse Other   . Arthritis Other   . Hypertension Other    SOCIAL HISTORY: Social History   Socioeconomic History  . Marital status: Divorced    Spouse name: Not on file  . Number of children: 0  . Years of education: 69  . Highest education level: Not on file  Occupational History  . Occupation: Disabled    Employer: [employer]  Social Needs  . Financial resource strain: Not on file  . Food insecurity:    Worry: Not on file    Inability: Not on file  .  Transportation needs:    Medical: Not on file    Non-medical: Not on file  Tobacco Use  . Smoking status: Former Smoker    Packs/day: 0.50    Years: 22.00    Pack years: 11.00    Types: Cigarettes    Last attempt to quit: 05/22/2015    Years since quitting: 2.7  . Smokeless tobacco: Never Used  Substance and Sexual Activity  . Alcohol use: Yes    Alcohol/week: 0.0 standard drinks    Comment: rare  . Drug use: No  . Sexual activity: Never    Birth control/protection: IUD  Lifestyle  . Physical activity:    Days per week: Not on file    Minutes per session: Not on file  . Stress: Not on file  Relationships  . Social connections:    Talks on phone: Not on file    Gets together: Not on file    Attends religious service: Not on file    Active member of club or organization: Not on file    Attends meetings of clubs or organizations: Not on file    Relationship status: Not on file  . Intimate partner violence:    Fear of current or ex partner: Not on file    Emotionally abused: Not on file    Physically abused: Not on file    Forced sexual activity: Not on file  Other Topics Concern  . Not on file  Social History Narrative  . Not on file    REVIEW OF SYSTEMS: Constitutional: No fevers, chills, or sweats, no generalized fatigue, change in appetite Eyes: No visual changes, double vision, eye pain Ear, nose and throat: No hearing loss, ear pain, nasal congestion, sore throat Cardiovascular: No chest pain, palpitations Respiratory:  No shortness of breath at rest or with exertion, wheezes GastrointestinaI: No nausea, vomiting, diarrhea, abdominal pain, fecal incontinence Genitourinary:  No dysuria, urinary retention or frequency Musculoskeletal:  No neck pain, back pain Integumentary: No rash, pruritus, skin lesions Neurological: as above Psychiatric: No depression, insomnia, anxiety Endocrine: No palpitations, fatigue, diaphoresis, mood swings, change in appetite, change  in weight, increased thirst Hematologic/Lymphatic:  No purpura, petechiae. Allergic/Immunologic: no itchy/runny eyes, nasal congestion, recent allergic reactions, rashes  PHYSICAL EXAM: *** General: No acute distress.  Patient appears ***-groomed.  *** body habitus. Head:  Normocephalic/atraumatic Eyes:  Fundi examined but not visualized Neck: supple, no paraspinal tenderness, full range of motion Heart:  Regular rate and rhythm Lungs:  Clear to auscultation bilaterally Back: No paraspinal tenderness Neurological Exam: alert and oriented to person, place, and time. Attention span and concentration intact, recent and remote memory intact, fund of knowledge intact.  Speech fluent and not dysarthric, language intact.  CN II-XII intact. Bulk and tone normal, muscle strength 5/5 throughout.  Sensation to light touch, temperature and vibration intact.  Deep tendon reflexes 2+ throughout, toes downgoing.  Finger to nose and heel to shin testing intact.  Gait normal, Romberg negative.  IMPRESSION: Benign essential tremor Polyneuropathy, likely secondary to type 2 diabetes mellitus  PLAN: ***  Metta Clines, DO  CC: [name], MD

## 2018-03-05 ENCOUNTER — Telehealth: Payer: Self-pay | Admitting: Neurology

## 2018-03-05 ENCOUNTER — Ambulatory Visit: Payer: Medicare Other | Admitting: Neurology

## 2018-03-09 ENCOUNTER — Other Ambulatory Visit: Payer: Self-pay | Admitting: Internal Medicine

## 2018-03-09 DIAGNOSIS — E282 Polycystic ovarian syndrome: Secondary | ICD-10-CM

## 2018-03-09 NOTE — Progress Notes (Signed)
Potassium

## 2018-03-10 ENCOUNTER — Other Ambulatory Visit: Payer: Medicare Other

## 2018-03-11 ENCOUNTER — Other Ambulatory Visit (INDEPENDENT_AMBULATORY_CARE_PROVIDER_SITE_OTHER): Payer: Medicare Other

## 2018-03-11 DIAGNOSIS — E282 Polycystic ovarian syndrome: Secondary | ICD-10-CM

## 2018-03-11 LAB — POTASSIUM: POTASSIUM: 4 meq/L (ref 3.5–5.1)

## 2018-03-13 ENCOUNTER — Other Ambulatory Visit: Payer: Self-pay | Admitting: Internal Medicine

## 2018-03-24 ENCOUNTER — Encounter: Payer: Self-pay | Admitting: Internal Medicine

## 2018-03-24 ENCOUNTER — Ambulatory Visit (INDEPENDENT_AMBULATORY_CARE_PROVIDER_SITE_OTHER): Payer: Medicare Other | Admitting: Internal Medicine

## 2018-03-24 VITALS — BP 110/70 | HR 86 | Temp 98.6°F | Ht 62.0 in | Wt 321.0 lb

## 2018-03-24 DIAGNOSIS — R05 Cough: Secondary | ICD-10-CM | POA: Diagnosis not present

## 2018-03-24 DIAGNOSIS — R059 Cough, unspecified: Secondary | ICD-10-CM

## 2018-03-24 DIAGNOSIS — J441 Chronic obstructive pulmonary disease with (acute) exacerbation: Secondary | ICD-10-CM | POA: Diagnosis not present

## 2018-03-24 DIAGNOSIS — E1165 Type 2 diabetes mellitus with hyperglycemia: Secondary | ICD-10-CM

## 2018-03-24 MED ORDER — LEVOFLOXACIN 500 MG PO TABS: 500.0000 mg | ORAL_TABLET | Freq: Every day | ORAL | 0 refills | Status: AC

## 2018-03-24 MED ORDER — HYDROCODONE-HOMATROPINE 5-1.5 MG/5ML PO SYRP: 5.0000 mL | ORAL_SOLUTION | Freq: Four times a day (QID) | ORAL | 0 refills | Status: AC | PRN

## 2018-03-24 MED ORDER — PREDNISONE 10 MG PO TABS: ORAL_TABLET | ORAL | 0 refills | Status: DC

## 2018-03-24 MED ORDER — METHYLPREDNISOLONE ACETATE 80 MG/ML IJ SUSP
80.0000 mg | Freq: Once | INTRAMUSCULAR | Status: AC
  Administered 2018-03-24: 80 mg via INTRAMUSCULAR

## 2018-03-24 NOTE — Progress Notes (Signed)
Subjective:    Patient ID: Autumn Massey, female    DOB: 14-Apr-1966, 52 y.o.   MRN: [id number]  HPI   Here with acute onset mild to mod 2-3 days ST, HA, general weakness and malaise, with prod cough greenish sputum, but Pt denies chest pain, increased sob or doe, wheezing, orthopnea, PND, increased LE swelling, palpitations, dizziness or syncope, except for onset mild wheezing and sob since last PM, could not sleep well   Pt denies polydipsia, polyuria.   Past Medical History:  Diagnosis Date  . ANXIETY DEPRESSION 01/29/2009  . DEPRESSION 11/19/2007  . Diabetes ([place])   . FREQUENCY, URINARY 10/17/2009  . HYPERLIPIDEMIA 11/19/2007  . HYPERTENSION 11/19/2007  . HYPOTHYROIDISM 11/19/2007  . Morbid obesity ([place])   . PNEUMONIA 08/06/2010  . POLYCYTHEMIA 01/29/2009  . Preventative health care 04/06/2011  . Sleep apnea    mild sleep apnea, on o2 at 2l at nighttime  . SLEEP RELATED HYPOVENTILATION/HYPOXEMIA CCE 02/08/2009  . Venous insufficiency 07/19/2012   Past Surgical History:  Procedure Laterality Date  . TONSILLECTOMY    . ULNAR TUNNEL RELEASE Left 02/22/2013   Procedure: LEFT ULNAR NERVE DECOMPRESSION ;  Surgeon: Tennis Must, MD;  Location: [facility];  Service: Orthopedics;  Laterality: Left;    reports that she quit smoking about 2 years ago. Her smoking use included cigarettes. She has a 11.00 pack-year smoking history. She has never used smokeless tobacco. She reports that she drinks alcohol. She reports that she does not use drugs. family history includes Alcohol abuse in her other; Arthritis in her other; Cancer in her maternal grandfather and mother; Hypertension in her other. Allergies  Allergen Reactions  . Cymbalta [Duloxetine Hcl]    Current Outpatient Medications on File Prior to Visit  Medication Sig Dispense Refill  . acetaminophen (TYLENOL) 500 MG tablet Take 1,000 mg by mouth every 6 (six) hours as needed for mild pain, moderate pain, fever or headache.       . albuterol (PROAIR HFA) 108 (90 Base) MCG/ACT inhaler 2 puffs every 4 hours as needed only  if your can't catch your breath 1 Inhaler 5  . albuterol (PROVENTIL) (2.5 MG/3ML) 0.083% nebulizer solution Take 3 mLs (2.5 mg total) by nebulization every 6 (six) hours as needed for wheezing or shortness of breath. 75 mL 0  . aspirin EC 81 MG tablet Take 81 mg by mouth daily.    . bisoprolol-hydrochlorothiazide (ZIAC) 5-6.25 MG tablet TAKE 1 TABLET BY MOUTH  DAILY 90 tablet 3  . BREO ELLIPTA 100-25 MCG/INH AEPB INHALE 1 PUFF INTO THE LUNGS EVERY MORNING. 60 each 0  . clonazePAM (KLONOPIN) 0.5 MG tablet Take 1 tablet (0.5 mg total) by mouth 2 (two) times daily as needed for anxiety. 60 tablet 5  . cyclobenzaprine (FLEXERIL) 5 MG tablet TAKE 1 TABLET BY MOUTH 3 TIMES DAILY AS NEEDED FOR MUSCLE SPASMS 270 tablet 1  . FLUoxetine (PROZAC) 20 MG capsule TAKE 1 CAPSULE BY MOUTH  ONCE DAILY 90 capsule 3  . furosemide (LASIX) 40 MG tablet TAKE 1 TABLET BY MOUTH  EVERY OTHER DAY AS NEEDED. 45 tablet 3  . GARLIC PO Take 1 tablet by mouth daily.    . irbesartan (AVAPRO) 75 MG tablet Take 1 tablet (75 mg total) by mouth at bedtime. 90 tablet 3  . JARDIANCE 10 MG TABS tablet TAKE 1 TABLET BY MOUTH EVERY DAY 30 tablet 4  . levothyroxine (SYNTHROID, LEVOTHROID) 200 MCG tablet TAKE 1 TABLET  BY MOUTH  DAILY.. 60 tablet 5  . LYRICA 50 MG capsule TAKE ONE CAPSULE TWICE A DAY 60 capsule 5  . metFORMIN (GLUCOPHAGE-XR) 500 MG 24 hr tablet Take 1 tablet (500 mg total) daily with breakfast by mouth. 90 tablet 3  . montelukast (SINGULAIR) 10 MG tablet TAKE 1 TABLET BY MOUTH AT  BEDTIME 90 tablet 3  . OXYGEN 2lpm with sleep only  [facility]    . potassium chloride SA (K-DUR,KLOR-CON) 20 MEQ tablet Take 1 tablet (20 mEq total) by mouth daily. 30 tablet 3  . primidone (MYSOLINE) 50 MG tablet Take 3 tablets (150 mg total) by mouth at bedtime. 90 tablet 5  . simvastatin (ZOCOR) 40 MG tablet TAKE 1 TABLET BY MOUTH AT  BEDTIME DAILY 90  tablet 3  . spironolactone (ALDACTONE) 25 MG tablet Take 1 tablet (25 mg total) by mouth 2 (two) times daily. 60 tablet 5  . Vitamin D, Ergocalciferol, (DRISDOL) 50000 units CAPS capsule TAKE 1 CAPSULE (50,000 UNITS TOTAL) BY MOUTH EVERY 7 (SEVEN) DAYS. 12 capsule 0   No current facility-administered medications on file prior to visit.    Review of Systems  Constitutional: Negative for other unusual diaphoresis or sweats HENT: Negative for ear discharge or swelling Eyes: Negative for other worsening visual disturbances Respiratory: Negative for stridor or other swelling  Gastrointestinal: Negative for worsening distension or other blood Genitourinary: Negative for retention or other urinary change Musculoskeletal: Negative for other MSK pain or swelling Skin: Negative for color change or other new lesions Neurological: Negative for worsening tremors and other numbness  Psychiatric/Behavioral: Negative for worsening agitation or other fatigue All other system neg per pt    Objective:   Physical Exam BP 110/70 (BP Location: Left Arm, Patient Position: Sitting, Cuff Size: Large)   Pulse 86   Temp 98.6 F (37 C) (Oral)   Ht 5\' 2"  (1.575 m)   Wt (!) 321 lb (145.6 kg)   LMP  (LMP Unknown)   SpO2 90%   BMI 58.71 kg/m  VS noted, supermorbid obese, mild ill Constitutional: Pt appears in NAD HENT: Head: NCAT.  Right Ear: External ear normal.  Left Ear: External ear normal.  Bilat tm's with mild erythema.  Max sinus areas non tender.  Pharynx with mild erythema, no exudate Eyes: . Pupils are equal, round, and reactive to light. Conjunctivae and EOM are normal Nose: without d/c or deformity Neck: Neck supple. Gross normal ROM Cardiovascular: Normal rate and regular rhythm.   Pulmonary/Chest: Effort normal and breath sounds decreased without rales but with few scattered bilat wheezing.  Neurological: Pt is alert. At baseline orientation, motor grossly intact Skin: Skin is warm. No  rashes, other new lesions, no LE edema Psychiatric: Pt behavior is normal without agitation  No other exam findings Lab Results  Component Value Date   WBC 9.2 01/21/2018   HGB 16.1 (H) 01/21/2018   HCT 47.8 (H) 01/21/2018   PLT 202.0 01/21/2018   GLUCOSE 154 (H) 03/02/2018   CHOL 163 01/21/2018   TRIG 273.0 (H) 01/21/2018   HDL 32.10 (L) 01/21/2018   LDLDIRECT 87.0 01/21/2018   LDLCALC 65 10/15/2016   ALT 23 01/21/2018   AST 25 01/21/2018   NA 136 03/02/2018   K 4.0 03/11/2018   CL 96 (L) 03/02/2018   CREATININE 0.73 03/02/2018   BUN 17 03/02/2018   CO2 30 03/02/2018   TSH 0.16 (L) 03/02/2018   HGBA1C 8.6 08/31/2017   MICROALBUR 0.9 11/22/2014  Assessment & Plan:

## 2018-03-24 NOTE — Assessment & Plan Note (Signed)
Mild to mod, for depomedrol IM 80, predpac asd, to f/u any worsening symptoms or concerns 

## 2018-03-24 NOTE — Patient Instructions (Signed)
Please take all new medication as prescribed - the antibiotic, cough medicine, and prednisone  You had the steroid shot today  Please continue all other medications as before, and refills have been done if requested.  Please have the pharmacy call with any other refills you may need.  Please continue your efforts at being more active, low cholesterol diet, and weight control.  Please keep your appointments with your specialists as you may have planned

## 2018-03-24 NOTE — Assessment & Plan Note (Signed)
Mild to mod, c/w bronchitis vs pna, declines cxr, for antibx course, cough med prn,  to f/u any worsening symptoms or concerns 

## 2018-03-24 NOTE — Assessment & Plan Note (Signed)
stable overall by history and exam, recent data reviewed with pt, and pt to continue medical treatment as before,  to f/u any worsening symptoms or concerns  

## 2018-04-13 NOTE — Progress Notes (Signed)
[name] [address], [place] [address] Phone: [phone] Subjective:   [name], am serving as a scribe for Dr. Hulan Saas.   CC: Right hip pain  RCB:[id number]  Autumn Massey is a 52 y.o. female coming in with complaint of right hip pain. Pain started 10 days ago. Unsure of mechanism. Feels like she had antalgic gait following initial pain. She now feels like her lateral right hip is bothering her. Sharp pain in groin with walking. Dull pain when sedentary.  Patient states that has been using a walker now.  Patient denies much radiation of the leg but when she does move a certain way or puts too much pressure does seem to give her a little bit of a shooting pain there.  Denies any associated back pain.  Seems to be almost mostly in the groin just recently has had more lateral pain       Past Medical History:  Diagnosis Date  . ANXIETY DEPRESSION 01/29/2009  . DEPRESSION 11/19/2007  . Diabetes ([place])   . FREQUENCY, URINARY 10/17/2009  . HYPERLIPIDEMIA 11/19/2007  . HYPERTENSION 11/19/2007  . HYPOTHYROIDISM 11/19/2007  . Morbid obesity ([place])   . PNEUMONIA 08/06/2010  . POLYCYTHEMIA 01/29/2009  . Preventative health care 04/06/2011  . Sleep apnea    mild sleep apnea, on o2 at 2l at nighttime  . SLEEP RELATED HYPOVENTILATION/HYPOXEMIA CCE 02/08/2009  . Venous insufficiency 07/19/2012   Past Surgical History:  Procedure Laterality Date  . TONSILLECTOMY    . ULNAR TUNNEL RELEASE Left 02/22/2013   Procedure: LEFT ULNAR NERVE DECOMPRESSION ;  Surgeon: Tennis Must, MD;  Location: [facility];  Service: Orthopedics;  Laterality: Left;   Social History   Socioeconomic History  . Marital status: Divorced    Spouse name: Not on file  . Number of children: 0  . Years of education: 3  . Highest education level: Not on file  Occupational History  . Occupation: Disabled    Employer: [employer]  Social Needs  .  Financial resource strain: Not on file  . Food insecurity:    Worry: Not on file    Inability: Not on file  . Transportation needs:    Medical: Not on file    Non-medical: Not on file  Tobacco Use  . Smoking status: Former Smoker    Packs/day: 0.50    Years: 22.00    Pack years: 11.00    Types: Cigarettes    Last attempt to quit: 05/22/2015    Years since quitting: 2.8  . Smokeless tobacco: Never Used  Substance and Sexual Activity  . Alcohol use: Yes    Alcohol/week: 0.0 standard drinks    Comment: rare  . Drug use: No  . Sexual activity: Never    Birth control/protection: IUD  Lifestyle  . Physical activity:    Days per week: Not on file    Minutes per session: Not on file  . Stress: Not on file  Relationships  . Social connections:    Talks on phone: Not on file    Gets together: Not on file    Attends religious service: Not on file    Active member of club or organization: Not on file    Attends meetings of clubs or organizations: Not on file    Relationship status: Not on file  Other Topics Concern  . Not on file  Social History Narrative  . Not on  file   Allergies  Allergen Reactions  . Cymbalta [Duloxetine Hcl]    Family History  Problem Relation Age of Onset  . Cancer Mother        melanoma   . Cancer Maternal Grandfather        prostate cancer  . Alcohol abuse Other   . Arthritis Other   . Hypertension Other     Current Outpatient Medications (Endocrine & Metabolic):  [name]  JARDIANCE 10 MG TABS tablet, TAKE 1 TABLET BY MOUTH EVERY DAY .  levothyroxine (SYNTHROID, LEVOTHROID) 200 MCG tablet, TAKE 1 TABLET BY MOUTH  DAILY.. .  metFORMIN (GLUCOPHAGE-XR) 500 MG 24 hr tablet, Take 1 tablet (500 mg total) daily with breakfast by mouth. .  predniSONE (DELTASONE) 10 MG tablet, 3 tabs by mouth per day for 3 days,2tabs per day for 3 days,1tab per day for 3 days  Current Outpatient Medications (Cardiovascular):  .  bisoprolol-hydrochlorothiazide (ZIAC) 5-6.25  MG tablet, TAKE 1 TABLET BY MOUTH  DAILY .  furosemide (LASIX) 40 MG tablet, TAKE 1 TABLET BY MOUTH  EVERY OTHER DAY AS NEEDED. [name]  irbesartan (AVAPRO) 75 MG tablet, Take 1 tablet (75 mg total) by mouth at bedtime. .  simvastatin (ZOCOR) 40 MG tablet, TAKE 1 TABLET BY MOUTH AT  BEDTIME DAILY .  spironolactone (ALDACTONE) 25 MG tablet, Take 1 tablet (25 mg total) by mouth 2 (two) times daily.  Current Outpatient Medications (Respiratory):  .  albuterol (PROAIR HFA) 108 (90 Base) MCG/ACT inhaler, 2 puffs every 4 hours as needed only  if your can't catch your breath .  albuterol (PROVENTIL) (2.5 MG/3ML) 0.083% nebulizer solution, Take 3 mLs (2.5 mg total) by nebulization every 6 (six) hours as needed for wheezing or shortness of breath. [name]  BREO ELLIPTA 100-25 MCG/INH AEPB, INHALE 1 PUFF INTO THE LUNGS EVERY MORNING. .  montelukast (SINGULAIR) 10 MG tablet, TAKE 1 TABLET BY MOUTH AT  BEDTIME  Current Outpatient Medications (Analgesics):  .  acetaminophen (TYLENOL) 500 MG tablet, Take 1,000 mg by mouth every 6 (six) hours as needed for mild pain, moderate pain, fever or headache.  [name]  aspirin EC 81 MG tablet, Take 81 mg by mouth daily. .  traMADol (ULTRAM) 50 MG tablet, Take 1 tablet (50 mg total) by mouth every 12 (twelve) hours as needed for up to 5 days.   Current Outpatient Medications (Other):  .  clonazePAM (KLONOPIN) 0.5 MG tablet, Take 1 tablet (0.5 mg total) by mouth 2 (two) times daily as needed for anxiety. .  cyclobenzaprine (FLEXERIL) 5 MG tablet, TAKE 1 TABLET BY MOUTH 3 TIMES DAILY AS NEEDED FOR MUSCLE SPASMS .  FLUoxetine (PROZAC) 20 MG capsule, TAKE 1 CAPSULE BY MOUTH  ONCE DAILY .  GARLIC PO, Take 1 tablet by mouth daily. [name]  LYRICA 50 MG capsule, TAKE ONE CAPSULE TWICE A DAY .  OXYGEN, 2lpm with sleep only  [facility] .  potassium chloride SA (K-DUR,KLOR-CON) 20 MEQ tablet, Take 1 tablet (20 mEq total) by mouth daily. .  primidone (MYSOLINE) 50 MG tablet, Take 3 tablets (150 mg total)  by mouth at bedtime. .  Vitamin D, Ergocalciferol, (DRISDOL) 50000 units CAPS capsule, TAKE 1 CAPSULE (50,000 UNITS TOTAL) BY MOUTH EVERY 7 (SEVEN) DAYS.    Past medical history, social, surgical and family history all reviewed in electronic medical record.  No pertanent information unless stated regarding to the chief complaint.   Review of Systems:  No headache, visual changes, nausea, vomiting, diarrhea, constipation,  dizziness, abdominal pain, skin rash, fevers, chills, night sweats, weight loss, swollen lymph nodes, b, joint swelling, , chest pain, shortness of breath, mood changes.  Positive muscle aches  Objective  Blood pressure 116/70, pulse 79, height 5\' 2"  (1.575 m), weight (!) 320 lb (145.2 kg), SpO2 91 %.   General: No apparent distress alert and oriented x3 mood and affect normal, dressed appropriately.  Morbidly obese HEENT: Pupils equal, extraocular movements intact  Respiratory: Patient's speak in full sentences and does not appear short of breath  Cardiovascular: 3+ lower extremity edema, non tender, no erythema patient does have hemosiderin deposits Skin: Warm dry intact with no signs of infection or rash on extremities or on axial skeleton.  Abdomen: Soft nontender  Neuro: Cranial nerves II through XII are intact, neurovascularly intact in all extremities with 2+ DTRs and 2+ pulses.  Lymph: No lymphadenopathy of posterior or anterior cervical chain or axillae bilaterally.  Gait antalgic walking with the aid of a walker MSK:  Non tender with full range of motion and good stability and symmetric strength and tone of shoulders, elbows, wrist,  knee and ankles bilaterally.  Right hip exam has a negative straight leg test.  Patient has no significant discomfort with resisted flexion.  Patient also has no resisted pain with resisted adduction.  Patient has a mild positive fulcrum test.  On palpation in the groin area there is a potential mass palpated.  Seems to get minorly  enlarged with Valsalva.  Severely tender to palpation.  Patient has good external range of motion of the hip as well.  Neurovascular intact distally.    Impression and Recommendations:     This case required medical decision making of moderate complexity. The above documentation has been reviewed and is accurate and complete Lyndal Pulley, DO       Note: This dictation was prepared with Dragon dictation along with smaller phrase technology. Any transcriptional errors that result from this process are unintentional.

## 2018-04-14 ENCOUNTER — Ambulatory Visit (INDEPENDENT_AMBULATORY_CARE_PROVIDER_SITE_OTHER)
Admission: RE | Admit: 2018-04-14 | Discharge: 2018-04-14 | Disposition: A | Discharge status: Home / Self Care | Payer: Medicare Other | Source: Ambulatory Visit | Attending: Family Medicine | Admitting: Family Medicine

## 2018-04-14 ENCOUNTER — Encounter: Payer: Self-pay | Admitting: Family Medicine

## 2018-04-14 ENCOUNTER — Encounter: Payer: Self-pay | Admitting: Internal Medicine

## 2018-04-14 ENCOUNTER — Other Ambulatory Visit: Payer: Self-pay | Admitting: Internal Medicine

## 2018-04-14 ENCOUNTER — Other Ambulatory Visit: Payer: Self-pay

## 2018-04-14 ENCOUNTER — Ambulatory Visit: Payer: Medicare Other | Admitting: Family Medicine

## 2018-04-14 VITALS — BP 116/70 | HR 79 | Ht 62.0 in | Wt 320.0 lb

## 2018-04-14 DIAGNOSIS — M25551 Pain in right hip: Secondary | ICD-10-CM | POA: Diagnosis not present

## 2018-04-14 DIAGNOSIS — K439 Ventral hernia without obstruction or gangrene: Secondary | ICD-10-CM | POA: Diagnosis not present

## 2018-04-14 MED ORDER — TRAMADOL HCL 50 MG PO TABS: 50.0000 mg | ORAL_TABLET | Freq: Two times a day (BID) | ORAL | 0 refills | Status: AC | PRN

## 2018-04-14 NOTE — Patient Instructions (Addendum)
Good to see you  Ice is your friend Ice 20 minutes 3-4 times daily. Usually after activity and before bed. We will get ultrasound of the abdomen to rule out hernia Worsening pain go to ED Xray downstairs Tramadol up to 3 times a day as needed but be careful  See me again in 2-3 weeks

## 2018-04-14 NOTE — Assessment & Plan Note (Signed)
Quite difficult to assess with patient's body habitus.  There appears to be a potential bulge in the right femoral area.  Concern for potential hernia.  Sent for an abdominal ultrasound.  X-rays ordered today as well.  Patient does have good range of motion of the hip especially with internal range of motion.  Patient had minimal discomfort though with resisted adduction of the hip.  Discussed against a possible groin injury but at this point we will have patient try this.  We did discuss with her about potential stress reaction secondary to patient's body habitus and will need to consider weight loss surgery  Follow-up with me again in 2 to 3 weeks.  Tramadol given

## 2018-04-14 NOTE — Progress Notes (Signed)
NEUROLOGY FOLLOW UP OFFICE NOTE  Autumn Massey [id number]  HISTORY OF PRESENT ILLNESS: Autumn Massey is a 52 year old right-handed woman with hypothyroidism, hyperlipidemia, impaired glucose tolerance, hypertension, hypoventilation, cerebrovascular disease and former smoker who follows up for essential tremor.  UPDATE:  She is taking primidone 150 mg at bedtime. For neuropathic pain, she retried Lyrica 50mg .  She is only taking it once a day (twice a day was too much for her).  On a couple of occasions, she had a jerk of the arm while in use (such as shaving her legs).  Tremors themselves are okay.  She has mild shake but nothing severe.  HISTORY:  She underwent left ulnar decompression surgery in August 2014.  When the cast was removed the following month, she noted twitching of her fifth digit.  An MRI of the cervical spine was performed in December 2014, which revealed no structural etiology for radiculopathy.  She had a NCV-EMG in January 2015 which was essentially normal.  She continued to have numbness, paresthesias and weakness of the fourth and fifth digits of the left hand.  She later developed tremor in both hands, more noticeable in the right hand since she is right-handed dominant.  It occurs when she is writing or holding utensils.  She denies gait difficulty, rigidity, lack of sense of smell, or history consistent with REM sleep behavior.  She is concerned about Parkinson's disease because her maternal grandfather and uncle had it.  Another uncle had tremor.  She has numbness and tingling in the toes and fingers.  She also notes electric pain in the right leg below the knee to the foot.  For neuropathic pain, she has tried gabapentin, Cymbalta, and Lyrica.  PAST MEDICAL HISTORY: Past Medical History:  Diagnosis Date  . ANXIETY DEPRESSION 01/29/2009  . DEPRESSION 11/19/2007  . Diabetes ([place])   . FREQUENCY, URINARY 10/17/2009  . HYPERLIPIDEMIA 11/19/2007  . HYPERTENSION  11/19/2007  . HYPOTHYROIDISM 11/19/2007  . Morbid obesity ([place])   . PNEUMONIA 08/06/2010  . POLYCYTHEMIA 01/29/2009  . Preventative health care 04/06/2011  . Sleep apnea    mild sleep apnea, on o2 at 2l at nighttime  . SLEEP RELATED HYPOVENTILATION/HYPOXEMIA CCE 02/08/2009  . Venous insufficiency 07/19/2012    MEDICATIONS: Current Outpatient Medications on File Prior to Visit  Medication Sig Dispense Refill  . acetaminophen (TYLENOL) 500 MG tablet Take 1,000 mg by mouth every 6 (six) hours as needed for mild pain, moderate pain, fever or headache.     . albuterol (PROAIR HFA) 108 (90 Base) MCG/ACT inhaler 2 puffs every 4 hours as needed only  if your can't catch your breath 1 Inhaler 5  . albuterol (PROVENTIL) (2.5 MG/3ML) 0.083% nebulizer solution Take 3 mLs (2.5 mg total) by nebulization every 6 (six) hours as needed for wheezing or shortness of breath. 75 mL 0  . aspirin EC 81 MG tablet Take 81 mg by mouth daily.    . bisoprolol-hydrochlorothiazide (ZIAC) 5-6.25 MG tablet TAKE 1 TABLET BY MOUTH  DAILY 90 tablet 3  . BREO ELLIPTA 100-25 MCG/INH AEPB INHALE 1 PUFF INTO THE LUNGS EVERY MORNING. 60 each 0  . clonazePAM (KLONOPIN) 0.5 MG tablet Take 1 tablet (0.5 mg total) by mouth 2 (two) times daily as needed for anxiety. 60 tablet 5  . cyclobenzaprine (FLEXERIL) 5 MG tablet TAKE 1 TABLET BY MOUTH 3 TIMES DAILY AS NEEDED FOR MUSCLE SPASMS 270 tablet 1  . FLUoxetine (PROZAC) 20 MG capsule TAKE 1 CAPSULE BY  MOUTH  ONCE DAILY 90 capsule 3  . furosemide (LASIX) 40 MG tablet TAKE 1 TABLET BY MOUTH  EVERY OTHER DAY AS NEEDED. 45 tablet 3  . GARLIC PO Take 1 tablet by mouth daily.    . irbesartan (AVAPRO) 75 MG tablet Take 1 tablet (75 mg total) by mouth at bedtime. 90 tablet 3  . JARDIANCE 10 MG TABS tablet TAKE 1 TABLET BY MOUTH EVERY DAY 30 tablet 4  . levothyroxine (SYNTHROID, LEVOTHROID) 200 MCG tablet TAKE 1 TABLET BY MOUTH  DAILY.. 60 tablet 5  . LYRICA 50 MG capsule TAKE ONE CAPSULE TWICE A DAY  60 capsule 5  . metFORMIN (GLUCOPHAGE-XR) 500 MG 24 hr tablet Take 1 tablet (500 mg total) daily with breakfast by mouth. 90 tablet 3  . montelukast (SINGULAIR) 10 MG tablet TAKE 1 TABLET BY MOUTH AT  BEDTIME 90 tablet 3  . OXYGEN 2lpm with sleep only  [facility]    . potassium chloride SA (K-DUR,KLOR-CON) 20 MEQ tablet Take 1 tablet (20 mEq total) by mouth daily. 30 tablet 3  . predniSONE (DELTASONE) 10 MG tablet 3 tabs by mouth per day for 3 days,2tabs per day for 3 days,1tab per day for 3 days 18 tablet 0  . primidone (MYSOLINE) 50 MG tablet Take 3 tablets (150 mg total) by mouth at bedtime. 90 tablet 5  . simvastatin (ZOCOR) 40 MG tablet TAKE 1 TABLET BY MOUTH AT  BEDTIME DAILY 90 tablet 3  . spironolactone (ALDACTONE) 25 MG tablet Take 1 tablet (25 mg total) by mouth 2 (two) times daily. 60 tablet 5  . Vitamin D, Ergocalciferol, (DRISDOL) 50000 units CAPS capsule TAKE 1 CAPSULE (50,000 UNITS TOTAL) BY MOUTH EVERY 7 (SEVEN) DAYS. 12 capsule 0   No current facility-administered medications on file prior to visit.     ALLERGIES: Allergies  Allergen Reactions  . Cymbalta [Duloxetine Hcl]     FAMILY HISTORY: Family History  Problem Relation Age of Onset  . Cancer Mother        melanoma   . Cancer Maternal Grandfather        prostate cancer  . Alcohol abuse Other   . Arthritis Other   . Hypertension Other    SOCIAL HISTORY: Social History   Socioeconomic History  . Marital status: Divorced    Spouse name: Not on file  . Number of children: 0  . Years of education: 15  . Highest education level: Not on file  Occupational History  . Occupation: Disabled    Employer: [employer]  Social Needs  . Financial resource strain: Not on file  . Food insecurity:    Worry: Not on file    Inability: Not on file  . Transportation needs:    Medical: Not on file    Non-medical: Not on file  Tobacco Use  . Smoking status: Former Smoker    Packs/day: 0.50    Years: 22.00    Pack  years: 11.00    Types: Cigarettes    Last attempt to quit: 05/22/2015    Years since quitting: 2.8  . Smokeless tobacco: Never Used  Substance and Sexual Activity  . Alcohol use: Yes    Alcohol/week: 0.0 standard drinks    Comment: rare  . Drug use: No  . Sexual activity: Never    Birth control/protection: IUD  Lifestyle  . Physical activity:    Days per week: Not on file    Minutes per session: Not on file  .  Stress: Not on file  Relationships  . Social connections:    Talks on phone: Not on file    Gets together: Not on file    Attends religious service: Not on file    Active member of club or organization: Not on file    Attends meetings of clubs or organizations: Not on file    Relationship status: Not on file  . Intimate partner violence:    Fear of current or ex partner: Not on file    Emotionally abused: Not on file    Physically abused: Not on file    Forced sexual activity: Not on file  Other Topics Concern  . Not on file  Social History Narrative  . Not on file    REVIEW OF SYSTEMS: Constitutional: No fevers, chills, or sweats, no generalized fatigue, change in appetite Eyes: No visual changes, double vision, eye pain Ear, nose and throat: No hearing loss, ear pain, nasal congestion, sore throat Cardiovascular: No chest pain, palpitations Respiratory:  No shortness of breath at rest or with exertion, wheezes GastrointestinaI: No nausea, vomiting, diarrhea, abdominal pain, fecal incontinence Genitourinary:  No dysuria, urinary retention or frequency Musculoskeletal:  No neck pain, back pain Integumentary: No rash, pruritus, skin lesions Neurological: as above Psychiatric: No depression, insomnia, anxiety Endocrine: No palpitations, fatigue, diaphoresis, mood swings, change in appetite, change in weight, increased thirst Hematologic/Lymphatic:  No purpura, petechiae. Allergic/Immunologic: no itchy/runny eyes, nasal congestion, recent allergic reactions,  rashes  PHYSICAL EXAM: Blood pressure 104/72, pulse 78, height 5\' 2"  (1.575 m), weight (!) 318 lb (144.2 kg), last menstrual period 03/31/2018, SpO2 98 %. General: No acute distress.  Patient appears well-groomed Head:  Normocephalic/atraumatic Eyes:  Fundi examined but not visualized Neck: supple, no paraspinal tenderness, full range of motion Heart:  Regular rate and rhythm Lungs:  Clear to auscultation bilaterally Back: No paraspinal tenderness Neurological Exam: alert and oriented to person, place, and time. Attention span and concentration intact, recent and remote memory intact, fund of knowledge intact.  Speech fluent and not dysarthric, language intact.  CN II-XII intact. Bulk and tone normal, muscle strength 5/5 throughout.  Sensation to light touch intact.  Deep tendon reflexes 2+ throughout, toes downgoing.  Finger to nose testing with mild fine tremor.  Wide-based gait, with walker.  Romberg not tested.  IMPRESSION: Essential tremor Neuropathy Morbid obesity  PLAN: 1.  Continue primidone 150mg  at bedtime 2.  She will try increasing Lyrica back to twice daily dosing 3.  Weight loss 4.  Follow up in 9 months.  25 minutes spent face to face with patient, over 50% spent discussing management   Metta Clines, DO  CC: [name], MD

## 2018-04-15 ENCOUNTER — Encounter: Payer: Self-pay | Admitting: Neurology

## 2018-04-15 ENCOUNTER — Other Ambulatory Visit: Payer: Self-pay

## 2018-04-15 ENCOUNTER — Ambulatory Visit: Payer: Medicare Other | Admitting: Neurology

## 2018-04-15 VITALS — BP 104/72 | HR 78 | Ht 62.0 in | Wt 318.0 lb

## 2018-04-15 DIAGNOSIS — K439 Ventral hernia without obstruction or gangrene: Secondary | ICD-10-CM

## 2018-04-15 DIAGNOSIS — G250 Essential tremor: Secondary | ICD-10-CM

## 2018-04-15 DIAGNOSIS — G629 Polyneuropathy, unspecified: Secondary | ICD-10-CM | POA: Diagnosis not present

## 2018-04-15 MED ORDER — FLUTICASONE FUROATE-VILANTEROL 100-25 MCG/INH IN AEPB: INHALATION_SPRAY | RESPIRATORY_TRACT | 0 refills | Status: DC

## 2018-04-15 NOTE — Patient Instructions (Signed)
1.  Continue primidone 150mg  at bedtime 2.  Try to increase Lyrica up to twice daily 3.  Follow up in 9 months

## 2018-04-19 ENCOUNTER — Encounter: Payer: Self-pay | Admitting: Family Medicine

## 2018-04-19 ENCOUNTER — Ambulatory Visit
Admission: RE | Admit: 2018-04-19 | Discharge: 2018-04-19 | Disposition: A | Discharge status: Home / Self Care | Payer: Medicare Other | Source: Ambulatory Visit | Attending: Family Medicine | Admitting: Family Medicine

## 2018-04-19 DIAGNOSIS — K469 Unspecified abdominal hernia without obstruction or gangrene: Secondary | ICD-10-CM | POA: Diagnosis not present

## 2018-04-19 DIAGNOSIS — R1031 Right lower quadrant pain: Secondary | ICD-10-CM

## 2018-04-19 DIAGNOSIS — K439 Ventral hernia without obstruction or gangrene: Secondary | ICD-10-CM

## 2018-04-23 ENCOUNTER — Telehealth: Payer: Self-pay | Admitting: Internal Medicine

## 2018-04-23 NOTE — Telephone Encounter (Signed)
Copied from [place] [id number]. Topic: General - Other >> Apr 23, 2018  2:40 PM Lennox Solders wrote: Reason for [id number] with [facility] is calling the ct abd/pelvis need to be with contrast  not with and without contrast

## 2018-04-27 ENCOUNTER — Other Ambulatory Visit: Payer: Self-pay | Admitting: *Deleted

## 2018-04-27 DIAGNOSIS — E1165 Type 2 diabetes mellitus with hyperglycemia: Secondary | ICD-10-CM

## 2018-04-27 NOTE — Telephone Encounter (Signed)
Order changed. & pt has been scheduled with [facility].

## 2018-04-28 NOTE — Telephone Encounter (Signed)
Patient calling and would like to know if she is able to pick up the contrast for the CAT scan on Friday at her lab visit? CAT scan scheduled for Saturday.  CB#: [phone] (can leave a voicemail with answer)

## 2018-04-29 NOTE — Telephone Encounter (Signed)
lmovm for pt to pickup contrast at [facility] imaging.

## 2018-04-30 ENCOUNTER — Other Ambulatory Visit: Payer: Self-pay | Admitting: Internal Medicine

## 2018-04-30 ENCOUNTER — Other Ambulatory Visit (INDEPENDENT_AMBULATORY_CARE_PROVIDER_SITE_OTHER): Payer: Medicare Other

## 2018-04-30 ENCOUNTER — Other Ambulatory Visit: Payer: Self-pay

## 2018-04-30 DIAGNOSIS — E039 Hypothyroidism, unspecified: Secondary | ICD-10-CM

## 2018-04-30 DIAGNOSIS — E1165 Type 2 diabetes mellitus with hyperglycemia: Secondary | ICD-10-CM | POA: Diagnosis not present

## 2018-04-30 LAB — BASIC METABOLIC PANEL
BUN: 16 mg/dL (ref 6–23)
CALCIUM: 9.8 mg/dL (ref 8.4–10.5)
CHLORIDE: 96 meq/L (ref 96–112)
CO2: 29 meq/L (ref 19–32)
Creatinine, Ser: 0.67 mg/dL (ref 0.40–1.20)
GFR: 98.09 mL/min (ref >60.00)
Glucose, Bld: 180 mg/dL — ABNORMAL HIGH (ref 70–99)
POTASSIUM: 3.8 meq/L (ref 3.5–5.1)
SODIUM: 136 meq/L (ref 135–145)

## 2018-04-30 LAB — TSH: TSH: 6.36 u[IU]/mL — AB (ref 0.35–4.50)

## 2018-04-30 LAB — T4, FREE: FREE T4: 0.97 ng/dL (ref 0.60–1.60)

## 2018-04-30 MED ORDER — LEVOTHYROXINE SODIUM 25 MCG PO TABS: 25.0000 ug | ORAL_TABLET | Freq: Every day | ORAL | 5 refills | Status: DC

## 2018-04-30 NOTE — Progress Notes (Signed)
[name] [place],  [address] Phone: [phone] Subjective:    I Autumn Massey am serving as a Education administrator for Dr. Hulan Saas.   I'm seeing this patient by the request  of:    CC: Right groin pain  MPN:[id number]  Autumn Massey is a 52 y.o. female coming in with complaint of right hip pain. Not feeling better. Had a CT scan this past Saturday.  CT scan showed the patient did have a fibroid noted but on the left side.  Otherwise unremarkable.  Independently visualized by me.  Patient also did have an abdominal ultrasound done that ruled out any type of inguinal hernia.  Continues to have pinpoint tenderness around the right groin area.  States that still walking with the aid of a walker. X-rays of the right hip were also independently visualized by me showing no arthritic changes    Past Medical History:  Diagnosis Date  . ANXIETY DEPRESSION 01/29/2009  . DEPRESSION 11/19/2007  . Diabetes ([place])   . FREQUENCY, URINARY 10/17/2009  . HYPERLIPIDEMIA 11/19/2007  . HYPERTENSION 11/19/2007  . HYPOTHYROIDISM 11/19/2007  . Morbid obesity ([place])   . PNEUMONIA 08/06/2010  . POLYCYTHEMIA 01/29/2009  . Preventative health care 04/06/2011  . Sleep apnea    mild sleep apnea, on o2 at 2l at nighttime  . SLEEP RELATED HYPOVENTILATION/HYPOXEMIA CCE 02/08/2009  . Venous insufficiency 07/19/2012   Past Surgical History:  Procedure Laterality Date  . TONSILLECTOMY    . ULNAR TUNNEL RELEASE Left 02/22/2013   Procedure: LEFT ULNAR NERVE DECOMPRESSION ;  Surgeon: Tennis Must, MD;  Location: [facility];  Service: Orthopedics;  Laterality: Left;   Social History   Socioeconomic History  . Marital status: Divorced    Spouse name: Not on file  . Number of children: 0  . Years of education: 64  . Highest education level: Not on file  Occupational History  . Occupation: Disabled    Employer: [employer]  Social Needs  . Financial  resource strain: Not on file  . Food insecurity:    Worry: Not on file    Inability: Not on file  . Transportation needs:    Medical: Not on file    Non-medical: Not on file  Tobacco Use  . Smoking status: Former Smoker    Packs/day: 0.50    Years: 22.00    Pack years: 11.00    Types: Cigarettes    Last attempt to quit: 05/22/2015    Years since quitting: 2.9  . Smokeless tobacco: Never Used  Substance and Sexual Activity  . Alcohol use: Yes    Alcohol/week: 0.0 standard drinks    Comment: rare  . Drug use: No  . Sexual activity: Never    Birth control/protection: IUD  Lifestyle  . Physical activity:    Days per week: Not on file    Minutes per session: Not on file  . Stress: Not on file  Relationships  . Social connections:    Talks on phone: Not on file    Gets together: Not on file    Attends religious service: Not on file    Active member of club or organization: Not on file    Attends meetings of clubs or organizations: Not on file    Relationship status: Not on file  Other Topics Concern  . Not on file  Social History Narrative  . Not on file   Allergies  Allergen  Reactions  . Cymbalta [Duloxetine Hcl]    Family History  Problem Relation Age of Onset  . Cancer Mother        melanoma   . Cancer Maternal Grandfather        prostate cancer  . Alcohol abuse Other   . Arthritis Other   . Hypertension Other     Current Outpatient Medications (Endocrine & Metabolic):  [name]  JARDIANCE 10 MG TABS tablet, TAKE 1 TABLET BY MOUTH EVERY DAY .  levothyroxine (SYNTHROID, LEVOTHROID) 200 MCG tablet, TAKE 1 TABLET BY MOUTH  DAILY.. .  levothyroxine (SYNTHROID, LEVOTHROID) 25 MCG tablet, Take 1 tablet (25 mcg total) by mouth daily before breakfast. .  metFORMIN (GLUCOPHAGE-XR) 500 MG 24 hr tablet, Take 1 tablet (500 mg total) daily with breakfast by mouth. .  predniSONE (DELTASONE) 10 MG tablet, 3 tabs by mouth per day for 3 days,2tabs per day for 3 days,1tab per day for  3 days  Current Outpatient Medications (Cardiovascular):  .  bisoprolol-hydrochlorothiazide (ZIAC) 5-6.25 MG tablet, TAKE 1 TABLET BY MOUTH  DAILY .  furosemide (LASIX) 40 MG tablet, TAKE 1 TABLET BY MOUTH  EVERY OTHER DAY AS NEEDED. [name]  irbesartan (AVAPRO) 75 MG tablet, Take 1 tablet (75 mg total) by mouth at bedtime. .  simvastatin (ZOCOR) 40 MG tablet, TAKE 1 TABLET BY MOUTH AT  BEDTIME DAILY .  spironolactone (ALDACTONE) 25 MG tablet, Take 1 tablet (25 mg total) by mouth 2 (two) times daily.  Current Outpatient Medications (Respiratory):  .  albuterol (PROAIR HFA) 108 (90 Base) MCG/ACT inhaler, 2 puffs every 4 hours as needed only  if your can't catch your breath .  albuterol (PROVENTIL) (2.5 MG/3ML) 0.083% nebulizer solution, Take 3 mLs (2.5 mg total) by nebulization every 6 (six) hours as needed for wheezing or shortness of breath. .  fluticasone furoate-vilanterol (BREO ELLIPTA) 100-25 MCG/INH AEPB, INHALE 1 PUFF INTO THE LUNGS EVERY MORNING. .  montelukast (SINGULAIR) 10 MG tablet, TAKE 1 TABLET BY MOUTH AT  BEDTIME  Current Outpatient Medications (Analgesics):  .  acetaminophen (TYLENOL) 500 MG tablet, Take 1,000 mg by mouth every 6 (six) hours as needed for mild pain, moderate pain, fever or headache.  [name]  aspirin EC 81 MG tablet, Take 81 mg by mouth daily.   Current Outpatient Medications (Other):  .  clonazePAM (KLONOPIN) 0.5 MG tablet, Take 1 tablet (0.5 mg total) by mouth 2 (two) times daily as needed for anxiety. .  cyclobenzaprine (FLEXERIL) 5 MG tablet, TAKE 1 TABLET BY MOUTH 3 TIMES DAILY AS NEEDED FOR MUSCLE SPASMS .  FLUoxetine (PROZAC) 20 MG capsule, TAKE 1 CAPSULE BY MOUTH  ONCE DAILY .  GARLIC PO, Take 1 tablet by mouth daily. [name]  LYRICA 50 MG capsule, TAKE ONE CAPSULE TWICE A DAY .  OXYGEN, 2lpm with sleep only  [facility] .  potassium chloride SA (K-DUR,KLOR-CON) 20 MEQ tablet, Take 1 tablet (20 mEq total) by mouth daily. .  primidone (MYSOLINE) 50 MG tablet, Take 3 tablets  (150 mg total) by mouth at bedtime. .  Vitamin D, Ergocalciferol, (DRISDOL) 50000 units CAPS capsule, TAKE 1 CAPSULE (50,000 UNITS TOTAL) BY MOUTH EVERY 7 (SEVEN) DAYS. [name]  AMBULATORY NON FORMULARY MEDICATION, Wheelchair .  ciprofloxacin (CIPRO) 500 MG tablet, Take 1 tablet (500 mg total) by mouth 2 (two) times daily. [name]  doxycycline (VIBRA-TABS) 100 MG tablet, Take 1 tablet (100 mg total) by mouth 2 (two) times daily for 10 days.    Past medical  history, social, surgical and family history all reviewed in electronic medical record.  No pertanent information unless stated regarding to the chief complaint.   Review of Systems:  No headache, visual changes, nausea, vomiting, diarrhea, constipation, dizziness, abdominal pain, skin rash, fevers, chills, night sweats, weight loss, swollen lymph nodes,, chest pain, shortness of breath, mood changes.  Positive muscle aches, body aches  Objective  Blood pressure 132/82, pulse 77, height 5\' 2"  (1.575 m), weight (!) 323 lb (146.5 kg), SpO2 94 %.    General: No apparent distress alert and oriented x3 mood and affect normal, dressed appropriately.  HEENT: Pupils equal, extraocular movements intact  Respiratory: Patient's speak in full sentences and does not appear short of breath  Cardiovascular: 2+ lower extremity edema, non tender, no erythema she does have hemosiderin deposits. Skin: Warm dry intact with no signs of infection or rash on extremities or on axial skeleton.  Abdomen: Soft nontender  Neuro: Cranial nerves II through XII are intact, neurovascularly intact in all extremities with 2+ DTRs and 1+ pulses in the lower extremity.  Lymph: No lymphadenopathy of posterior or anterior cervical chain or axillae bilaterally.  Possible lymphadenopathy noted in the inguinal canals bilaterally right greater than left with severe tenderness Gait antalgic using the aid of a walker MSK:  tender with full range of motion and good stability and symmetric  strength and tone of shoulders, elbows, wrist, hip, knee and ankles bilaterally.  Patient's right hip though is severely tender to palpation in the groin area.  Possible some lymphadenopathy noted skin no true hernia.  Negative straight leg test.  More pain with external range of motion and internal range of motion.    Impression and Recommendations:      The above documentation has been reviewed and is accurate and complete Lyndal Pulley, DO       Note: This dictation was prepared with Dragon dictation along with smaller phrase technology. Any transcriptional errors that result from this process are unintentional.

## 2018-05-01 ENCOUNTER — Ambulatory Visit
Admission: RE | Admit: 2018-05-01 | Discharge: 2018-05-01 | Disposition: A | Discharge status: Home / Self Care | Payer: Medicare Other | Source: Ambulatory Visit | Attending: Family Medicine | Admitting: Family Medicine

## 2018-05-01 DIAGNOSIS — K760 Fatty (change of) liver, not elsewhere classified: Secondary | ICD-10-CM | POA: Diagnosis not present

## 2018-05-01 DIAGNOSIS — R1031 Right lower quadrant pain: Secondary | ICD-10-CM

## 2018-05-01 MED ORDER — IOPAMIDOL (ISOVUE-300) INJECTION 61%
100.0000 mL | Freq: Once | INTRAVENOUS | Status: AC | PRN
  Administered 2018-05-01: 100 mL via INTRAVENOUS

## 2018-05-03 ENCOUNTER — Ambulatory Visit (INDEPENDENT_AMBULATORY_CARE_PROVIDER_SITE_OTHER): Payer: Medicare Other | Admitting: Family Medicine

## 2018-05-03 ENCOUNTER — Encounter: Payer: Self-pay | Admitting: Family Medicine

## 2018-05-03 DIAGNOSIS — M25551 Pain in right hip: Secondary | ICD-10-CM

## 2018-05-03 DIAGNOSIS — I872 Venous insufficiency (chronic) (peripheral): Secondary | ICD-10-CM

## 2018-05-03 MED ORDER — DOXYCYCLINE HYCLATE 100 MG PO TABS: 100.0000 mg | ORAL_TABLET | Freq: Two times a day (BID) | ORAL | 0 refills | Status: AC

## 2018-05-03 MED ORDER — CIPROFLOXACIN HCL 500 MG PO TABS: 500.0000 mg | ORAL_TABLET | Freq: Two times a day (BID) | ORAL | 0 refills | Status: DC

## 2018-05-03 MED ORDER — AMBULATORY NON FORMULARY MEDICATION: 0 refills | Status: DC

## 2018-05-03 NOTE — Patient Instructions (Signed)
Good to see you  Doxycycline 2 times a day for 110 days Cipro 2 times a day for 10 days.  I am really hoping it helps Send me a message in 4 days and tell me how it is doing  If not a lot better would recommend the wheel chairs See me again in 4 weeks

## 2018-05-03 NOTE — Assessment & Plan Note (Signed)
Severe further work-up may be necessary

## 2018-05-03 NOTE — Assessment & Plan Note (Signed)
Right groin pain.  X-rays were unremarkable for any arthritic changes but avascular necrosis is still within the differential but pain is more with external range of motion and internal range of motion.  More likely musculoskeletal or lymphadenopathy in general.  Because of patient's comorbidities antibiotics given today.  This would be before any type of further imaging.  We need to consider the possibility of a CT of the hip or MR arthrogram.  Discussed with patient possible laboratory work-up as well may be necessary but has had labs recently.  Patient has some different pain medications for any breakthrough.  Discussed the possibility of a thigh compression for stability.  Encourage weight loss.  Follow-up with me again in 3 to 4 weeks.  Spent  25 minutes with patient face-to-face and had greater than 50% of counseling including as described above in assessment and plan.

## 2018-05-10 ENCOUNTER — Encounter: Payer: Self-pay | Admitting: Family Medicine

## 2018-05-10 ENCOUNTER — Other Ambulatory Visit: Payer: Self-pay | Admitting: Internal Medicine

## 2018-05-10 DIAGNOSIS — M25551 Pain in right hip: Secondary | ICD-10-CM

## 2018-05-25 ENCOUNTER — Other Ambulatory Visit: Payer: Self-pay | Admitting: Internal Medicine

## 2018-05-25 ENCOUNTER — Other Ambulatory Visit: Payer: Self-pay | Admitting: Neurology

## 2018-05-31 ENCOUNTER — Other Ambulatory Visit: Payer: Medicare Other

## 2018-06-01 ENCOUNTER — Other Ambulatory Visit: Payer: Medicare Other

## 2018-06-02 ENCOUNTER — Encounter: Payer: Self-pay | Admitting: Internal Medicine

## 2018-06-02 ENCOUNTER — Other Ambulatory Visit: Payer: Self-pay

## 2018-06-02 ENCOUNTER — Ambulatory Visit (INDEPENDENT_AMBULATORY_CARE_PROVIDER_SITE_OTHER): Payer: Medicare Other | Admitting: Internal Medicine

## 2018-06-02 VITALS — BP 124/86 | HR 88 | Temp 98.7°F | Ht 62.0 in | Wt 319.0 lb

## 2018-06-02 DIAGNOSIS — J441 Chronic obstructive pulmonary disease with (acute) exacerbation: Secondary | ICD-10-CM | POA: Diagnosis not present

## 2018-06-02 DIAGNOSIS — E1165 Type 2 diabetes mellitus with hyperglycemia: Secondary | ICD-10-CM

## 2018-06-02 DIAGNOSIS — I10 Essential (primary) hypertension: Secondary | ICD-10-CM

## 2018-06-02 MED ORDER — PREDNISONE 10 MG PO TABS: ORAL_TABLET | ORAL | 0 refills | Status: DC

## 2018-06-02 MED ORDER — LEVOFLOXACIN 500 MG PO TABS: 500.0000 mg | ORAL_TABLET | Freq: Every day | ORAL | 0 refills | Status: AC

## 2018-06-02 MED ORDER — EMPAGLIFLOZIN 10 MG PO TABS: 10.0000 mg | ORAL_TABLET | Freq: Every day | ORAL | 4 refills | Status: DC

## 2018-06-02 MED ORDER — HYDROCODONE-HOMATROPINE 5-1.5 MG/5ML PO SYRP: 5.0000 mL | ORAL_SOLUTION | Freq: Four times a day (QID) | ORAL | 0 refills | Status: AC | PRN

## 2018-06-02 MED ORDER — SPIRONOLACTONE 25 MG PO TABS: 25.0000 mg | ORAL_TABLET | Freq: Two times a day (BID) | ORAL | 5 refills | Status: DC

## 2018-06-02 MED ORDER — METHYLPREDNISOLONE ACETATE 80 MG/ML IJ SUSP
80.0000 mg | Freq: Once | INTRAMUSCULAR | Status: AC
  Administered 2018-06-02: 80 mg via INTRAMUSCULAR

## 2018-06-02 NOTE — Assessment & Plan Note (Signed)
stable overall by history and exam, recent data reviewed with pt, and pt to continue medical treatment as before,  to f/u any worsening symptoms or concerns  

## 2018-06-02 NOTE — Progress Notes (Signed)
Subjective:    Patient ID: Autumn Massey, female    DOB: 07-19-1965, 52 y.o.   MRN: [id number]  HPI   Here with acute onset mild to mod 2-3 days ST, HA, general weakness and malaise, with prod cough greenish sputum, but Pt denies chest pain, increased sob or doe, wheezing, orthopnea, PND, increased LE swelling, palpitations, dizziness or syncope, except for onset mild wheezing and sob since last PM.  Pt denies new neurological symptoms such as new headache, or facial or extremity weakness or numbness   Pt denies polydipsia, polyuria.  No other new complaints Past Medical History:  Diagnosis Date  . ANXIETY DEPRESSION 01/29/2009  . DEPRESSION 11/19/2007  . Diabetes ([place])   . FREQUENCY, URINARY 10/17/2009  . HYPERLIPIDEMIA 11/19/2007  . HYPERTENSION 11/19/2007  . HYPOTHYROIDISM 11/19/2007  . Morbid obesity ([place])   . PNEUMONIA 08/06/2010  . POLYCYTHEMIA 01/29/2009  . Preventative health care 04/06/2011  . Sleep apnea    mild sleep apnea, on o2 at 2l at nighttime  . SLEEP RELATED HYPOVENTILATION/HYPOXEMIA CCE 02/08/2009  . Venous insufficiency 07/19/2012   Past Surgical History:  Procedure Laterality Date  . TONSILLECTOMY    . ULNAR TUNNEL RELEASE Left 02/22/2013   Procedure: LEFT ULNAR NERVE DECOMPRESSION ;  Surgeon: Tennis Must, MD;  Location: [place];  Service: Orthopedics;  Laterality: Left;    reports that she quit smoking about 3 years ago. Her smoking use included cigarettes. She has a 11.00 pack-year smoking history. She has never used smokeless tobacco. She reports that she drinks alcohol. She reports that she does not use drugs. family history includes Alcohol abuse in her other; Arthritis in her other; Cancer in her maternal grandfather and mother; Hypertension in her other. Allergies  Allergen Reactions  . Cymbalta [Duloxetine Hcl]    Current Outpatient Medications on File Prior to Visit  Medication Sig Dispense Refill  . acetaminophen (TYLENOL) 500 MG tablet  Take 1,000 mg by mouth every 6 (six) hours as needed for mild pain, moderate pain, fever or headache.     . albuterol (PROAIR HFA) 108 (90 Base) MCG/ACT inhaler 2 puffs every 4 hours as needed only  if your can't catch your breath 1 Inhaler 5  . albuterol (PROVENTIL) (2.5 MG/3ML) 0.083% nebulizer solution Take 3 mLs (2.5 mg total) by nebulization every 6 (six) hours as needed for wheezing or shortness of breath. 75 mL 0  . AMBULATORY NON FORMULARY MEDICATION Wheelchair 1 Piece 0  . aspirin EC 81 MG tablet Take 81 mg by mouth daily.    . bisoprolol-hydrochlorothiazide (ZIAC) 5-6.25 MG tablet TAKE 1 TABLET BY MOUTH  DAILY 90 tablet 3  . ciprofloxacin (CIPRO) 500 MG tablet Take 1 tablet (500 mg total) by mouth 2 (two) times daily. 20 tablet 0  . clonazePAM (KLONOPIN) 0.5 MG tablet Take 1 tablet (0.5 mg total) by mouth 2 (two) times daily as needed for anxiety. 60 tablet 5  . cyclobenzaprine (FLEXERIL) 5 MG tablet TAKE 1 TABLET BY MOUTH 3  TIMES DAILY AS NEEDED FOR  MUSCLE SPASM(S) 270 tablet 1  . FLUoxetine (PROZAC) 20 MG capsule TAKE 1 CAPSULE BY MOUTH  ONCE DAILY 90 capsule 3  . fluticasone furoate-vilanterol (BREO ELLIPTA) 100-25 MCG/INH AEPB INHALE 1 PUFF INTO THE LUNGS EVERY MORNING. 60 each 0  . furosemide (LASIX) 40 MG tablet TAKE 1 TABLET BY MOUTH  EVERY OTHER DAY AS NEEDED. 45 tablet 3  . GARLIC PO Take 1 tablet by mouth  daily.    . irbesartan (AVAPRO) 75 MG tablet Take 1 tablet (75 mg total) by mouth at bedtime. 90 tablet 3  . levothyroxine (SYNTHROID, LEVOTHROID) 200 MCG tablet TAKE 1 TABLET BY MOUTH  DAILY.. 60 tablet 5  . levothyroxine (SYNTHROID, LEVOTHROID) 25 MCG tablet Take 1 tablet (25 mcg total) by mouth daily before breakfast. 45 tablet 5  . LYRICA 50 MG capsule TAKE ONE CAPSULE TWICE A DAY 60 capsule 5  . metFORMIN (GLUCOPHAGE-XR) 500 MG 24 hr tablet Take 1 tablet (500 mg total) daily with breakfast by mouth. 90 tablet 3  . montelukast (SINGULAIR) 10 MG tablet TAKE 1 TABLET BY  MOUTH AT  BEDTIME 90 tablet 3  . OXYGEN 2lpm with sleep only  [facility]    . primidone (MYSOLINE) 50 MG tablet Take 3 tablets (150 mg total) by mouth at bedtime. 90 tablet 5  . primidone (MYSOLINE) 50 MG tablet TAKE 3 TABLETS BY MOUTH AT  BEDTIME 90 tablet 6  . simvastatin (ZOCOR) 40 MG tablet TAKE 1 TABLET BY MOUTH AT  BEDTIME DAILY 90 tablet 3  . Vitamin D, Ergocalciferol, (DRISDOL) 50000 units CAPS capsule TAKE 1 CAPSULE (50,000 UNITS TOTAL) BY MOUTH EVERY 7 (SEVEN) DAYS. 12 capsule 0   No current facility-administered medications on file prior to visit.    Review of Systems  Constitutional: Negative for other unusual diaphoresis or sweats HENT: Negative for ear discharge or swelling Eyes: Negative for other worsening visual disturbances Respiratory: Negative for stridor or other swelling  Gastrointestinal: Negative for worsening distension or other blood Genitourinary: Negative for retention or other urinary change Musculoskeletal: Negative for other MSK pain or swelling Skin: Negative for color change or other new lesions Neurological: Negative for worsening tremors and other numbness  Psychiatric/Behavioral: Negative for worsening agitation or other fatigue All other system neg per pt    Objective:   Physical Exam BP 124/86   Pulse 88   Temp 98.7 F (37.1 C) (Oral)   Ht 5\' 2"  (1.575 m)   Wt (!) 319 lb (144.7 kg)   SpO2 90%   BMI 58.35 kg/m  VS noted, mild ill Constitutional: Pt appears in NAD HENT: Head: NCAT.  Right Ear: External ear normal.  Left Ear: External ear normal.  Eyes: . Pupils are equal, round, and reactive to light. Conjunctivae and EOM are normal Bilat tm's with mild erythema.  Max sinus areas mild tender.  Pharynx with mild erythema, no exudate Nose: without d/c or deformity Neck: Neck supple. Gross normal ROM Cardiovascular: Normal rate and regular rhythm.   Pulmonary/Chest: Effort normal and breath sounds without rales but with bilat mild wheezing.    Neurological: Pt is alert. At baseline orientation, motor grossly intact Skin: Skin is warm. No rashes, other new lesions, no LE edema Psychiatric: Pt behavior is normal without agitation  No other exam findings Lab Results  Component Value Date   WBC 9.2 01/21/2018   HGB 16.1 (H) 01/21/2018   HCT 47.8 (H) 01/21/2018   PLT 202.0 01/21/2018   GLUCOSE 180 (H) 04/30/2018   CHOL 163 01/21/2018   TRIG 273.0 (H) 01/21/2018   HDL 32.10 (L) 01/21/2018   LDLDIRECT 87.0 01/21/2018   LDLCALC 65 10/15/2016   ALT 23 01/21/2018   AST 25 01/21/2018   NA 136 04/30/2018   K 3.8 04/30/2018   CL 96 04/30/2018   CREATININE 0.67 04/30/2018   BUN 16 04/30/2018   CO2 29 04/30/2018   TSH 6.36 (H) 04/30/2018  HGBA1C 8.6 08/31/2017   MICROALBUR 0.9 11/22/2014        Assessment & Plan:

## 2018-06-02 NOTE — Assessment & Plan Note (Addendum)
Mild to mod, for depomedrol IM 80, predpac asd, inhaler prn, antibiotic asd,  to f/u any worsening symptoms or concerns

## 2018-06-02 NOTE — Patient Instructions (Signed)
You had the steroid shot today  Please take all new medication as prescribed - the antibiotic, cough medicine, and prednisone  Please continue all other medications as before, and refills have been done if requested.  Please have the pharmacy call with any other refills you may need.  Please continue your efforts at being more active, low cholesterol diet, and weight control.  Please keep your appointments with your specialists as you may have planned     

## 2018-06-07 ENCOUNTER — Ambulatory Visit: Payer: Medicare Other | Admitting: Family Medicine

## 2018-06-13 ENCOUNTER — Other Ambulatory Visit: Payer: Medicare Other

## 2018-07-01 ENCOUNTER — Encounter: Payer: Self-pay | Admitting: Internal Medicine

## 2018-07-01 MED ORDER — AZITHROMYCIN 250 MG PO TABS: ORAL_TABLET | ORAL | 1 refills | Status: DC

## 2018-07-03 ENCOUNTER — Ambulatory Visit
Admission: RE | Admit: 2018-07-03 | Discharge: 2018-07-03 | Disposition: A | Discharge status: Home / Self Care | Payer: Medicare Other | Source: Ambulatory Visit | Attending: Family Medicine | Admitting: Family Medicine

## 2018-07-03 DIAGNOSIS — M25551 Pain in right hip: Secondary | ICD-10-CM | POA: Diagnosis not present

## 2018-07-06 ENCOUNTER — Encounter: Payer: Self-pay | Admitting: Family Medicine

## 2018-07-06 ENCOUNTER — Other Ambulatory Visit: Payer: Self-pay | Admitting: Internal Medicine

## 2018-07-09 ENCOUNTER — Other Ambulatory Visit: Payer: Self-pay | Admitting: Neurology

## 2018-07-26 ENCOUNTER — Encounter: Payer: Self-pay | Admitting: Internal Medicine

## 2018-07-26 ENCOUNTER — Other Ambulatory Visit: Payer: Self-pay | Admitting: Internal Medicine

## 2018-07-26 ENCOUNTER — Ambulatory Visit: Payer: Medicare Other | Admitting: Internal Medicine

## 2018-07-26 ENCOUNTER — Other Ambulatory Visit (INDEPENDENT_AMBULATORY_CARE_PROVIDER_SITE_OTHER): Payer: Medicare Other

## 2018-07-26 ENCOUNTER — Other Ambulatory Visit: Payer: Medicare Other

## 2018-07-26 DIAGNOSIS — E039 Hypothyroidism, unspecified: Secondary | ICD-10-CM | POA: Diagnosis not present

## 2018-07-26 DIAGNOSIS — J9612 Chronic respiratory failure with hypercapnia: Secondary | ICD-10-CM

## 2018-07-26 DIAGNOSIS — J9611 Chronic respiratory failure with hypoxia: Secondary | ICD-10-CM | POA: Diagnosis not present

## 2018-07-26 DIAGNOSIS — J449 Chronic obstructive pulmonary disease, unspecified: Secondary | ICD-10-CM | POA: Diagnosis not present

## 2018-07-26 LAB — T4, FREE: Free T4: 1.21 ng/dL (ref 0.60–1.60)

## 2018-07-26 LAB — TSH: TSH: 0.65 u[IU]/mL (ref 0.35–4.50)

## 2018-07-26 MED ORDER — FLUTICASONE FUROATE-VILANTEROL 100-25 MCG/INH IN AEPB: INHALATION_SPRAY | RESPIRATORY_TRACT | 0 refills | Status: DC

## 2018-07-26 MED ORDER — FLUTICASONE FUROATE-VILANTEROL 100-25 MCG/INH IN AEPB: INHALATION_SPRAY | RESPIRATORY_TRACT | 3 refills | Status: DC

## 2018-07-26 NOTE — Progress Notes (Signed)
Subjective:     Patient ID: Autumn Massey, female   DOB: [birth year]    MRN: [id number]     Brief patient profile:  20 yowf  With morbid obesity Quit smoking 05/2015 on admit p several months worsening sob  > [place] x 5 days > dx copd/ fluid overload and started on spiriva / pred/abx lowest wt 326 but  Started  worse again 1st of Jan 2017 with wt gain / sob s cough  So referred  07/18/2015 by Dr Jenny Reichmann to pulmonary clinic with no significiant airfow obstruction on initial eval so characterized as GOLD 0 stage / probable AB      History of Present Illness  07/18/2015 1st [facility] office visit/ Zonie Crutcher  maint rx spiriva  Chief Complaint  Patient presents with  . Pulmonary Consult    Referred by Dr. [name]. Pt c/o SOB since Nov 2016. She states that she was dxed with PNA at this time and then told that she had COPD. She gets SOB walking across a parking lot or doing housework.    has 02 but only uses at hs 2lpm  X years  Presently doe =  MMRC3 = can't walk 100 yards even at a slow pace at a flat grade s stopping due to sob   Onset insidious/ pattern gradually worse assoc with leg swelling and wt loss but s excess/ purulent sputum or mucus plugs Breaths better p saba ? spiriva helping  rec Stop spiriva and start stiolto 2 puffs each am and fill the prescription if you like it, if not resume spiriva handihaler  Only use your albuterol   Stop normodyne  Start bystolic 10 mg daily in place of normodyne  Wear your 02 as much as you can    09/03/2015  f/u ov/Leasia Swann re: obesity/ maint rx = stiolto 2 each am / hbp  Chief Complaint  Patient presents with  . Follow-up    Using 2L O2 at bedtime and while doing a lot of walking and also during the day while at home. Mild wheezing first thing in the morning improves after using inhaler. Denies any coughing, cp/tightness. Pt was having some bladder leakage before but has noticed improvement since starting 02.  resting ok off 02 / sleeping on 02  and leaning on cart on 02 HT / can't do WM yet but trend is improving  rec Finish up your stiolto and then start  symbicort 80 Take 2 puffs first thing in am and then another 2 puffs about 12 hours later.  Only use your albuterol(proair)  as a rescue medication to be used if you can't catch your breath by resting or doing a relaxed purse lip breathing pattern.  - The less you use it, the better it will work when you need it.    10/05/2015 acute extended ov/Caryle Helgeson re: sob  Chief Complaint  Patient presents with  . Acute Visit    SOB, fatigued, wheezing more than usual. Pt checked her O2 when walking today and checked her O2 levels-down to 76-had to put O2 concentrator on to help bring up to uppers 90's. Usually wears O2 QHS only. Pt lcan tell difference in breathing with Stilto-now on Symbicort since last OV instructions. Pt went back to   changed back to labetalol and symbicort about the same time and gradually worse ever since with increased need for saba due to sob assoc with subjective wheeze  s cough but comfortable at rest - no assoc increase  in chronic  leg swelling/ calf pain  rec Stop normodyne and symbicort Start bystolic 10 mg daily = bisoprolol 5 mg daily thru your plan  Start stiolto 2pffs each am  Work on maintaining perfect  inhaler technique:    10/09/2015 acute extended ov/Marleta Lapierre re:  02 dep resp failure / ? Now hypercarbic  Chief Complaint  Patient presents with  . Acute Visit    Breathing progressively worse since 10/05/15 ov. She has also had some wheezing, chest tightness and non prod cough for the past 2 days.   on 02 at rest fine, but across the room sob and desat and now coughing also mostly dry  rec Please remember to go to the lab department downstairs for your tests - we will call you with the results when they are available.   Please see patient coordinator before you leave today  to schedule CTa Chest   Add: last bun > 30 so changed to v/q    12/02/2016  f/u ov/Khyren Hing  re: flovent hfa 2 each am/  2nd 2 puffs on " if needed " Chief Complaint  Patient presents with  . Follow-up    Breathing is overall doing well. No new co's today. She rarely uses neb.    rare need for saba in any form now  rec Plan A = Automatic = Stop flovent and start BREO 100 one each am  Plan B = Backup Only use your albuterol as a rescue medication  Plan C = Crisis - only use your albuterol nebulizer if you first try Plan B and it fails to help > ok to use the nebulizer up to every 4 hours but if start needing it regularly call for immediate appointment   07/26/2018  f/u ov/Faye Strohman re:  AB/ breo one click each am  Chief Complaint  Patient presents with  . Follow-up    Here for refill on Breo. Her breathing is doing well. She states she sees Dr Jenny Reichmann when she has bronchitis and she has had 3 episodes of this since last seen here. She rarely uses her albuterol inhaler or neb.   Dyspnea:  More limited back pain rad to R hip Cough: only with flares of bronchitis and in between just daily sensation of pnds  Sleeping: bed flat, 2 pillows SABA use: not now 02: 2lpm at hs, no am ha   No obvious day to day or daytime variability or assoc excess/ purulent sputum or mucus plugs or hemoptysis or cp or chest tightness, subjective wheeze or overt sinus or hb symptoms.   Sleeping as above  without nocturnal  or early am exacerbation  of respiratory  c/o's or need for noct saba. Also denies any obvious fluctuation of symptoms with weather or environmental changes or other aggravating or alleviating factors except as outlined above   No unusual exposure hx or h/o childhood pna/ asthma or knowledge of premature birth.  Current Allergies, Complete Past Medical History, Past Surgical History, Family History, and Social History were reviewed in [employer] record.  ROS  The following are not active complaints unless bolded Hoarseness, sore throat, dysphagia, dental problems,  itching, sneezing,  nasal congestion or discharge of excess mucus or purulent secretions, ear ache,   fever, chills, sweats, unintended wt loss or wt gain, classically pleuritic or exertional cp,  orthopnea pnd or arm/hand swelling  or leg swelling, presyncope, palpitations, abdominal pain, anorexia, nausea, vomiting, diarrhea  or change in bowel habits or change in  bladder habits, change in stools or change in urine, dysuria, hematuria,  rash, arthralgias, visual complaints, headache, numbness, weakness or ataxia or problems with walking or coordination,  change in mood or  memory.        Current Meds  Medication Sig  . acetaminophen (TYLENOL) 500 MG tablet Take 1,000 mg by mouth every 6 (six) hours as needed for mild pain, moderate pain, fever or headache.   . albuterol (PROAIR HFA) 108 (90 Base) MCG/ACT inhaler 2 puffs every 4 hours as needed only  if your can't catch your breath  . albuterol (PROVENTIL) (2.5 MG/3ML) 0.083% nebulizer solution Take 3 mLs (2.5 mg total) by nebulization every 6 (six) hours as needed for wheezing or shortness of breath.  . AMBULATORY NON FORMULARY MEDICATION Wheelchair  . aspirin EC 81 MG tablet Take 81 mg by mouth daily.  . bisoprolol-hydrochlorothiazide (ZIAC) 5-6.25 MG tablet TAKE 1 TABLET BY MOUTH  DAILY  . Cholecalciferol (DIALYVITE VITAMIN D 5000 PO) Take 1 capsule by mouth daily.  . clonazePAM (KLONOPIN) 0.5 MG tablet Take 1 tablet (0.5 mg total) by mouth 2 (two) times daily as needed for anxiety.  . cyclobenzaprine (FLEXERIL) 5 MG tablet TAKE 1 TABLET BY MOUTH 3  TIMES DAILY AS NEEDED FOR  MUSCLE SPASM(S)  . empagliflozin (JARDIANCE) 10 MG TABS tablet Take 10 mg by mouth daily.  [name] FLUoxetine (PROZAC) 20 MG capsule TAKE 1 CAPSULE BY MOUTH  ONCE DAILY  . fluticasone furoate-vilanterol (BREO ELLIPTA) 100-25 MCG/INH AEPB INHALE 1 PUFF INTO THE LUNGS EVERY MORNING.  . furosemide (LASIX) 40 MG tablet TAKE 1 TABLET BY MOUTH  EVERY OTHER DAY AS NEEDED.  [name] GARLIC PO  Take 1 tablet by mouth daily.  . irbesartan (AVAPRO) 75 MG tablet TAKE 1 TABLET BY MOUTH AT  BEDTIME  . levothyroxine (SYNTHROID, LEVOTHROID) 200 MCG tablet TAKE 1 TABLET BY MOUTH  DAILY..  . levothyroxine (SYNTHROID, LEVOTHROID) 25 MCG tablet Take 1 tablet (25 mcg total) by mouth daily before breakfast.  . metFORMIN (GLUCOPHAGE-XR) 500 MG 24 hr tablet Take 1 tablet (500 mg total) daily with breakfast by mouth.  . montelukast (SINGULAIR) 10 MG tablet TAKE 1 TABLET BY MOUTH AT  BEDTIME  . OXYGEN 2lpm with sleep only  [facility]  . pregabalin (LYRICA) 50 MG capsule TAKE ONE CAPSULE BY MOUTH TWICE A DAY  . primidone (MYSOLINE) 50 MG tablet Take 3 tablets (150 mg total) by mouth at bedtime.  . simvastatin (ZOCOR) 40 MG tablet TAKE 1 TABLET BY MOUTH AT  BEDTIME DAILY  . spironolactone (ALDACTONE) 25 MG tablet Take 1 tablet (25 mg total) by mouth 2 (two) times daily.                   Objective:   Physical Exam   Obese pleasant amb wf nad  07/26/2018       318  10/06/2015          353 > 10/09/2015   354 >12/02/2016   329  09/03/2015          353   07/18/15 349 lb 9.6 oz (158.578 kg)  05/31/15 332 lb (150.594 kg)  01/29/15 333 lb (151.048 kg)     Vital signs reviewed - Note on arrival 02 sats  92% on RA        HEENT: Poor dentition, nl turbinates bilaterally, and oropharynx. Nl external ear canals without cough reflex   NECK :  without JVD/Nodes/TM/ nl carotid upstrokes bilaterally  LUNGS: no acc muscle use,  Nl contour chest which is clear to A and P bilaterally without cough on insp or exp maneuvers   CV:  RRR  no s3 or murmur or increase in P2, and trace pitting both LE's  ABD:  Massively obese  and nontender with poor inspiratory excursion in the supine position. No bruits or organomegaly appreciated, bowel sounds nl  MS:  Nl gait/ ext warm without deformities, calf tenderness, cyanosis or clubbing No obvious joint restrictions   SKIN: warm and dry with severe chronic venous  stasis changes both LE's    NEURO:  alert, approp, nl sensorium with  no motor or cerebellar deficits apparent.                      Assessment:

## 2018-07-26 NOTE — Patient Instructions (Addendum)
No change in medications but be sure to blow BREO out thru nose   Please schedule a follow up visit in 12  months but call sooner if needed for any cold or bronchitis lasting than 2 weeks

## 2018-07-27 ENCOUNTER — Encounter: Payer: Self-pay | Admitting: Internal Medicine

## 2018-07-27 NOTE — Assessment & Plan Note (Addendum)
Quit smoking 2016   - PFT's  05/22/10   FEV1 1.53 (62 % ) ratio 86  p no % improvement from saba with DLCO  87 % and ERV 40% at wt 209  - Quit smoking 05/2015 - 07/18/2015    try stiolto - PFT's s obst 09/03/2015 with ERV 12 p am stiolto so rec change to symbicort 80 2bid   - 10/05/2015 worse on symbicort while back on labetalol > resumed   bystolic then changed to bisoprolol   - 07/26/2018  After extensive coaching inhaler device,  effectiveness =    90%    Concerned about "3 episodes of bronchitis" on Breo x one year but given her nl exam and spirometry hard to justify escalating to trelegy in this setting and in fact question whether part of the problem is the use of DPI here so I advised her to return her for any "cold" lasting more than a few weeks or if she breaks the rule of two's to consider using laba/ics hfa vs change over to trelegy as maint

## 2018-07-27 NOTE — Assessment & Plan Note (Signed)
ERV 12% by pfts 09/03/2015    Body mass index is 58.16 kg/m.  -  trending down/ encouraged Lab Results  Component Value Date   TSH 0.65 07/26/2018     Contributing to gerd risk/ doe/reviewed the need and the process to achieve and maintain neg calorie balance > defer f/u primary care including intermittently monitoring thyroid status.    I had an extended discussion with the patient reviewing all relevant studies completed to date and  lasting 15 to 20 minutes of a 25 minute visit    See device teaching which extended face to face time for this visit.  Each maintenance medication was reviewed in detail including emphasizing most importantly the difference between maintenance and prns and under what circumstances the prns are to be triggered using an action plan format that is not reflected in the computer generated alphabetically organized AVS which I have not found useful in most complex patients, especially with respiratory illnesses  Please see AVS for specific instructions unique to this visit that I personally wrote and verbalized to the the pt in detail and then reviewed with pt  by my nurse highlighting any  changes in therapy recommended at today's visit to their plan of care.

## 2018-07-27 NOTE — Assessment & Plan Note (Addendum)
Attributed to OHS, not copd HCO3  07/23/15  = 30 c/w only mild hypercarbia  Up to 33 10/09/2015 02 2lpm hs and prn daytime  as of  12/02/2016  - HC03  11/04/16 = 34  - HC03  05/28/18 = 29   As of 07/26/2018  Wearing 02 2lpm hs only

## 2018-08-18 ENCOUNTER — Encounter: Payer: Self-pay | Admitting: Family Medicine

## 2018-08-19 ENCOUNTER — Ambulatory Visit: Payer: Medicare Other | Admitting: Internal Medicine

## 2018-08-19 ENCOUNTER — Encounter: Payer: Self-pay | Admitting: Family Medicine

## 2018-08-19 ENCOUNTER — Ambulatory Visit (INDEPENDENT_AMBULATORY_CARE_PROVIDER_SITE_OTHER): Payer: Medicare Other | Admitting: Family Medicine

## 2018-08-19 VITALS — BP 130/80 | HR 75 | Temp 98.2°F | Ht 62.0 in | Wt 317.0 lb

## 2018-08-19 DIAGNOSIS — H66001 Acute suppurative otitis media without spontaneous rupture of ear drum, right ear: Secondary | ICD-10-CM | POA: Diagnosis not present

## 2018-08-19 MED ORDER — AMOXICILLIN-POT CLAVULANATE 875-125 MG PO TABS: 1.0000 | ORAL_TABLET | Freq: Two times a day (BID) | ORAL | 0 refills | Status: DC

## 2018-08-19 NOTE — Progress Notes (Signed)
Patient ID: HEDAYA LATENDRESSE, female   DOB: 06-06-1966, 53 y.o.   MRN: [id number]  PCP: Biagio Borg, MD  Subjective:  Autumn Massey is a 53 y.o. year old very pleasant female patient who presents with  symptoms including mild nasal congestion, sore throat, right ear pain -started: 4 days ago, symptoms are not improving -previous treatments: Cough drops and chloraseptic spray have provided limited benefit -sick contacts/travel/risks: denies flu exposure. Recent exposure to 53 year old twins that have been not feeling well.  -Hx of: HTN, COPD  Former smoker, quite date 05/22/2015  ROS-denies fever, cough, SOB, NVD, tooth pain  Pertinent Past Medical History- Asthma exacerbation, T2DM  Medications- reviewed  Current Outpatient Medications  Medication Sig Dispense Refill  . acetaminophen (TYLENOL) 500 MG tablet Take 1,000 mg by mouth every 6 (six) hours as needed for mild pain, moderate pain, fever or headache.     . albuterol (PROAIR HFA) 108 (90 Base) MCG/ACT inhaler 2 puffs every 4 hours as needed only  if your can't catch your breath 1 Inhaler 5  . albuterol (PROVENTIL) (2.5 MG/3ML) 0.083% nebulizer solution Take 3 mLs (2.5 mg total) by nebulization every 6 (six) hours as needed for wheezing or shortness of breath. 75 mL 0  . AMBULATORY NON FORMULARY MEDICATION Wheelchair 1 Piece 0  . aspirin EC 81 MG tablet Take 81 mg by mouth daily.    . bisoprolol-hydrochlorothiazide (ZIAC) 5-6.25 MG tablet TAKE 1 TABLET BY MOUTH  DAILY 90 tablet 3  . Cholecalciferol (DIALYVITE VITAMIN D 5000 PO) Take 1 capsule by mouth daily.    . clonazePAM (KLONOPIN) 0.5 MG tablet Take 1 tablet (0.5 mg total) by mouth 2 (two) times daily as needed for anxiety. 60 tablet 5  . cyclobenzaprine (FLEXERIL) 5 MG tablet TAKE 1 TABLET BY MOUTH 3  TIMES DAILY AS NEEDED FOR  MUSCLE SPASM(S) 270 tablet 1  . empagliflozin (JARDIANCE) 10 MG TABS tablet Take 10 mg by mouth daily. 30 tablet 4  . FLUoxetine (PROZAC) 20 MG  capsule TAKE 1 CAPSULE BY MOUTH  ONCE DAILY 90 capsule 3  . fluticasone furoate-vilanterol (BREO ELLIPTA) 100-25 MCG/INH AEPB INHALE 1 PUFF INTO THE LUNGS EVERY MORNING. 90 each 0  . furosemide (LASIX) 40 MG tablet TAKE 1 TABLET BY MOUTH  EVERY OTHER DAY AS NEEDED. 45 tablet 1  . GARLIC PO Take 1 tablet by mouth daily.    . irbesartan (AVAPRO) 75 MG tablet TAKE 1 TABLET BY MOUTH AT  BEDTIME 90 tablet 3  . levothyroxine (SYNTHROID, LEVOTHROID) 200 MCG tablet TAKE 1 TABLET BY MOUTH  DAILY.. 60 tablet 5  . levothyroxine (SYNTHROID, LEVOTHROID) 25 MCG tablet Take 1 tablet (25 mcg total) by mouth daily before breakfast. 45 tablet 5  . metFORMIN (GLUCOPHAGE-XR) 500 MG 24 hr tablet Take 1 tablet (500 mg total) daily with breakfast by mouth. 90 tablet 3  . montelukast (SINGULAIR) 10 MG tablet TAKE 1 TABLET BY MOUTH AT  BEDTIME 90 tablet 3  . OXYGEN 2lpm with sleep only  [facility]    . pregabalin (LYRICA) 50 MG capsule TAKE ONE CAPSULE BY MOUTH TWICE A DAY 60 capsule 1  . primidone (MYSOLINE) 50 MG tablet Take 3 tablets (150 mg total) by mouth at bedtime. 90 tablet 5  . simvastatin (ZOCOR) 40 MG tablet TAKE 1 TABLET BY MOUTH AT  BEDTIME DAILY 90 tablet 3  . spironolactone (ALDACTONE) 25 MG tablet Take 1 tablet (25 mg total) by mouth 2 (two) times  daily. 60 tablet 5   No current facility-administered medications for this visit.     Objective: BP 130/80 (BP Location: Left Arm, Patient Position: Sitting, Cuff Size: Large)   Pulse 75   Temp 98.2 F (36.8 C) (Oral)   Ht 5\' 2"  (1.575 m)   Wt (!) 317 lb (143.8 kg)   SpO2 94%   BMI 57.98 kg/m  Gen: NAD, resting comfortably, severely obese HEENT: Turbinates mildy erythematous, Left TM normal, Right TM bulging with erythema. TMs intact bilaterally. oropharynx mildly erythematous with no exudate or edema, no sinus tenderness CV: RRR no murmurs rubs or gallops Lungs: CTAB no crackles, wheeze, rhonchi Ext: no edema Skin: warm, dry, no rash Neuro: grossly  normal, moves all extremities  Assessment/Plan: 1. Acute suppurative otitis media of right ear without spontaneous rupture of tympanic membrane, recurrence not specified Right AOM, treat with Augmentin, Tylenol for discomfort. If no improvement or worsening symptoms, she will follow up for further evaluation. Return precautions provided.   - amoxicillin-clavulanate (AUGMENTIN) 875-125 MG tablet; Take 1 tablet by mouth 2 (two) times daily.  Dispense: 20 tablet; Refill: [place], FNP

## 2018-08-19 NOTE — Patient Instructions (Signed)
An antibiotic has been provided for your ear infection today.  Please take this antibiotic with food as directed.  Tylenol can be used for discomfort. Please follow dosing directions on box and do not exceed recommended dose.  If no improvement or worsening symptoms, please follow up for further evaluation.   Otitis Media, Adult  Otitis media means that the middle ear is red and swollen (inflamed) and full of fluid. The condition usually goes away on its own. Follow these instructions at home:  Take over-the-counter and prescription medicines only as told by your doctor.  If you were prescribed an antibiotic medicine, take it as told by your doctor. Do not stop taking the antibiotic even if you start to feel better.  Keep all follow-up visits as told by your doctor. This is important. Contact a doctor if:  You have bleeding from your nose.  There is a lump on your neck.  You are not getting better in 5 days.  You feel worse instead of better. Get help right away if:  You have pain that is not helped with medicine.  You have swelling, redness, or pain around your ear.  You get a stiff neck.  You cannot move part of your face (paralyzed).  You notice that the bone behind your ear hurts when you touch it.  You get a very bad headache. Summary  Otitis media means that the middle ear is red, swollen, and full of fluid.  This condition usually goes away on its own. In some cases, treatment may be needed.  If you were prescribed an antibiotic medicine, take it as told by your doctor. This information is not intended to replace advice given to you by your health care provider. Make sure you discuss any questions you have with your health care provider. Document Released: 12/03/2007 Document Revised: 07/07/2016 Document Reviewed: 07/07/2016 Elsevier Interactive Patient Education  2019 [employer].

## 2018-08-30 ENCOUNTER — Ambulatory Visit (INDEPENDENT_AMBULATORY_CARE_PROVIDER_SITE_OTHER): Payer: Medicare Other | Admitting: Internal Medicine

## 2018-08-30 ENCOUNTER — Other Ambulatory Visit: Payer: Self-pay

## 2018-08-30 ENCOUNTER — Encounter: Payer: Self-pay | Admitting: Internal Medicine

## 2018-08-30 VITALS — BP 120/60 | HR 81 | Ht 62.0 in | Wt 317.0 lb

## 2018-08-30 DIAGNOSIS — E039 Hypothyroidism, unspecified: Secondary | ICD-10-CM | POA: Diagnosis not present

## 2018-08-30 DIAGNOSIS — E282 Polycystic ovarian syndrome: Secondary | ICD-10-CM | POA: Diagnosis not present

## 2018-08-30 DIAGNOSIS — E1165 Type 2 diabetes mellitus with hyperglycemia: Secondary | ICD-10-CM | POA: Diagnosis not present

## 2018-08-30 LAB — POCT GLYCOSYLATED HEMOGLOBIN (HGB A1C): Hemoglobin A1C: 7.8 % — AB (ref 4.0–5.6)

## 2018-08-30 NOTE — Patient Instructions (Addendum)
Please continue: - Metformin ER 500 mg with dinner - Jardiance 10 mg before breakfast  Try Jackfruit.  Please continue Levothyroxine 225 g daily  Take the thyroid hormone every day, with water, at least 30 minutes before breakfast, separated by at least 4 hours from: - acid reflux medications - calcium - iron - multivitamins  Please stop at the lab.  Continue spironolactone 25 mg twice a day  Please return in 6 months.

## 2018-08-30 NOTE — Progress Notes (Signed)
Patient ID: Autumn Massey, female   DOB: 1965-09-25, 53 y.o.   MRN: [id number]   HPI  Autumn Massey is a 53 y.o.-year-old female, returning for f/u for DM2, hypothyroidism, and PCOS. Last visit 6 months ago. On disability now.  Before last visit, she started to change her diet: more veggies, less fried foods, less highly processed foods and starches.  She lost 15 pounds before last visit and she was feeling much better.  Since last visit, she cut out bread except low carb bread.  At this visit, she has a ear inf >> finished ABx.  She is very hypoacusic  Today.  PCOS: Reviewed history:  - weight gain: Maximal weight 364  - was on Phentermine, which worked initially, then stopped working  - Would consider gastric bypass but only as a last resort - + hirsutism >> started to see facial hair in her 30s - + hair loss >> female pattern, and bald spot in vertex - No acne - had a Mirena IUD placed 04/2013 for heavy menstrual cycles >> out now; no menses since IUD out - has frequent Prednisone tapers   She was previously on spironolactone 50 mg twice daily but her potassium increased so we had to decrease the dose to 25 mg twice daily.  We ended up stopping spironolactone 11/2016.    However, at last visit she was complaining of more facial hair and more frontal balding and we restarted spironolactone, now on 25 mg twice a day.  No orthostatic dizziness.  She feels her hair has decreased.  Hypothyroidism: Pt. has been dx with hypothyroidism in 2005.  Pt is on levothyroxine 225, decreased from 262 Mcg daily, taken: - in am - fasting - at least 30 min from b'fast - no Ca, Fe, MVI, PPIs - not on Biotin  Reviewed patient's TFTs: Lab Results  Component Value Date   TSH 0.65 07/26/2018   TSH 6.36 (H) 04/30/2018   TSH 0.16 (L) 03/02/2018   TSH 0.26 (L) 01/21/2018   TSH 1.29 05/05/2017   TSH 5.30 (H) 03/10/2017   TSH 8.74 (H) 12/01/2016   TSH 14.632 (H) 10/15/2016   TSH 9.96 (H)  04/22/2016   TSH 2.25 07/23/2015   FREET4 1.21 07/26/2018   FREET4 0.97 04/30/2018   FREET4 1.32 03/02/2018   FREET4 1.25 05/05/2017   FREET4 1.02 03/10/2017   FREET4 0.86 12/01/2016   FREET4 1.05 10/15/2016   FREET4 0.77 04/22/2016   FREET4 1.08 07/23/2015   FREET4 0.91 11/22/2014    Pt denies: - feeling nodules in neck - hoarseness - dysphagia - choking - SOB with lying down  She was on Neurontin for tremors, but had to decrease the dose because of somnolence and then she was able to stop. She is on primidone >> restarted by Dr. Tomi Likens.  She is on Lyrica at night.  DM2:  Reviewed HbA1c: 03/09/2018: HbA1c calculated from fructosamine is better, at 5.5%! 09/02/2017: HbA1c calculated from Fructosamine is 6.1%. 05/05/2017: HbA1c calculated from Fructosamine is 6.00%. 03/10/2017: HbA1c calculated from Fructosamine is 6.17% (great!). 11/2016: HbA1c calculated from the fructosamine is radically better, at 5.77%! Lab Results  Component Value Date   HGBA1C 8.6 08/31/2017   HGBA1C 9.5 (H) 09/29/2016   HGBA1C 9.3 (H) 09/28/2016   She is on: - Metformin 1000 mg with dinner >> 500 mg with dinner  - Jardiance 10 mg before b'fast -  started 02/2017 >> stopped b/c UTI and also had a tear on her labia  and was afraid that this could have gotten infected if she continued with Jardiance.  This is now healing.  However, she still has dysuria and wonders if we can check a urinalysis at this visit.  At last visit, she had more leg swelling since she stopped Jardiance and she wanted to restart it.  We did restart the lower dose in 02/2018.  No recent infections.  Not taking Januvia >> not covered.  She takes Lasix every other day - not recently.  She checks sugars seldom: - am: 101-122 >> 127-140 >> 124-134, 140 >> 131 - lunch: n/c - after lunch:  135-147, 152 >> n/c >> 145-150 >> 142 - dinner: n/c >> 120-140, 157 >> n/c - after dinner: 160-170, 188 >> 140-150 >> up to 160  She quit smoking  05/2015.  On Vitamin D 50,000 units weekly.  ROS: + See HPI Constitutional: no weight gain/no weight loss, no fatigue, no subjective hyperthermia, no subjective hypothermia Eyes: no blurry vision, no xerophthalmia ENT: no sore throat, + see HPI, + hypoacusis Cardiovascular: no CP/no SOB/no palpitations/no leg swelling Respiratory: no cough/no SOB/no wheezing Gastrointestinal: no N/no V/no D/no C/no acid reflux Musculoskeletal: no muscle aches/no joint aches Skin: no rashes, no hair loss Neurological: no tremors/no numbness/no tingling/no dizziness  I reviewed pt's medications, allergies, PMH, social hx, family hx, and changes were documented in the history of present illness. Otherwise, unchanged from my initial visit note.   Past Medical History:  Diagnosis Date  . ANXIETY DEPRESSION 01/29/2009  . DEPRESSION 11/19/2007  . Diabetes ([place])   . FREQUENCY, URINARY 10/17/2009  . HYPERLIPIDEMIA 11/19/2007  . HYPERTENSION 11/19/2007  . HYPOTHYROIDISM 11/19/2007  . Morbid obesity ([place])   . PNEUMONIA 08/06/2010  . POLYCYTHEMIA 01/29/2009  . Preventative health care 04/06/2011  . Sleep apnea    mild sleep apnea, on o2 at 2l at nighttime  . SLEEP RELATED HYPOVENTILATION/HYPOXEMIA CCE 02/08/2009  . Venous insufficiency 07/19/2012   Past Surgical History:  Procedure Laterality Date  . TONSILLECTOMY    . ULNAR TUNNEL RELEASE Left 02/22/2013   Procedure: LEFT ULNAR NERVE DECOMPRESSION ;  Surgeon: Tennis Must, MD;  Location: [place];  Service: Orthopedics;  Laterality: Left;   Social History   Socioeconomic History  . Marital status: Divorced    Spouse name: Not on file  . Number of children: 0  . Years of education: 83  . Highest education level: Not on file  Occupational History  . Occupation: Disabled    Employer: [employer]  Social Needs  . Financial resource strain: Not on file  . Food insecurity:    Worry: Not on file    Inability: Not on file  .  Transportation needs:    Medical: Not on file    Non-medical: Not on file  Tobacco Use  . Smoking status: Former Smoker    Packs/day: 0.50    Years: 22.00    Pack years: 11.00    Types: Cigarettes    Last attempt to quit: 05/22/2015    Years since quitting: 3.2  . Smokeless tobacco: Never Used  Substance and Sexual Activity  . Alcohol use: Yes    Alcohol/week: 0.0 standard drinks    Comment: rare  . Drug use: No  . Sexual activity: Never    Birth control/protection: I.U.D.  Lifestyle  . Physical activity:    Days per week: Not on file    Minutes per session: Not on file  . Stress: Not  on file  Relationships  . Social connections:    Talks on phone: Not on file    Gets together: Not on file    Attends religious service: Not on file    Active member of club or organization: Not on file    Attends meetings of clubs or organizations: Not on file    Relationship status: Not on file  . Intimate partner violence:    Fear of current or ex partner: Not on file    Emotionally abused: Not on file    Physically abused: Not on file    Forced sexual activity: Not on file  Other Topics Concern  . Not on file  Social History Narrative  . Not on file   Current Outpatient Medications on File Prior to Visit  Medication Sig Dispense Refill  . acetaminophen (TYLENOL) 500 MG tablet Take 1,000 mg by mouth every 6 (six) hours as needed for mild pain, moderate pain, fever or headache.     . albuterol (PROAIR HFA) 108 (90 Base) MCG/ACT inhaler 2 puffs every 4 hours as needed only  if your can't catch your breath 1 Inhaler 5  . albuterol (PROVENTIL) (2.5 MG/3ML) 0.083% nebulizer solution Take 3 mLs (2.5 mg total) by nebulization every 6 (six) hours as needed for wheezing or shortness of breath. 75 mL 0  . AMBULATORY NON FORMULARY MEDICATION Wheelchair 1 Piece 0  . aspirin EC 81 MG tablet Take 81 mg by mouth daily.    . bisoprolol-hydrochlorothiazide (ZIAC) 5-6.25 MG tablet TAKE 1 TABLET BY  MOUTH  DAILY 90 tablet 3  . Cholecalciferol (DIALYVITE VITAMIN D 5000 PO) Take 1 capsule by mouth daily.    . clonazePAM (KLONOPIN) 0.5 MG tablet Take 1 tablet (0.5 mg total) by mouth 2 (two) times daily as needed for anxiety. 60 tablet 5  . cyclobenzaprine (FLEXERIL) 5 MG tablet TAKE 1 TABLET BY MOUTH 3  TIMES DAILY AS NEEDED FOR  MUSCLE SPASM(S) 270 tablet 1  . empagliflozin (JARDIANCE) 10 MG TABS tablet Take 10 mg by mouth daily. 30 tablet 4  . FLUoxetine (PROZAC) 20 MG capsule TAKE 1 CAPSULE BY MOUTH  ONCE DAILY 90 capsule 3  . fluticasone furoate-vilanterol (BREO ELLIPTA) 100-25 MCG/INH AEPB INHALE 1 PUFF INTO THE LUNGS EVERY MORNING. 90 each 0  . furosemide (LASIX) 40 MG tablet TAKE 1 TABLET BY MOUTH  EVERY OTHER DAY AS NEEDED. 45 tablet 1  . GARLIC PO Take 1 tablet by mouth daily.    . irbesartan (AVAPRO) 75 MG tablet TAKE 1 TABLET BY MOUTH AT  BEDTIME 90 tablet 3  . levothyroxine (SYNTHROID, LEVOTHROID) 200 MCG tablet TAKE 1 TABLET BY MOUTH  DAILY.. 60 tablet 5  . levothyroxine (SYNTHROID, LEVOTHROID) 25 MCG tablet Take 1 tablet (25 mcg total) by mouth daily before breakfast. 45 tablet 5  . metFORMIN (GLUCOPHAGE-XR) 500 MG 24 hr tablet Take 1 tablet (500 mg total) daily with breakfast by mouth. 90 tablet 3  . montelukast (SINGULAIR) 10 MG tablet TAKE 1 TABLET BY MOUTH AT  BEDTIME 90 tablet 3  . OXYGEN 2lpm with sleep only  [facility]    . pregabalin (LYRICA) 50 MG capsule TAKE ONE CAPSULE BY MOUTH TWICE A DAY 60 capsule 1  . primidone (MYSOLINE) 50 MG tablet Take 3 tablets (150 mg total) by mouth at bedtime. 90 tablet 5  . simvastatin (ZOCOR) 40 MG tablet TAKE 1 TABLET BY MOUTH AT  BEDTIME DAILY 90 tablet 3  . spironolactone (ALDACTONE) 25  MG tablet Take 1 tablet (25 mg total) by mouth 2 (two) times daily. 60 tablet 5   No current facility-administered medications on file prior to visit.    Allergies  Allergen Reactions  . Cymbalta [Duloxetine Hcl]    Family History  Problem Relation Age  of Onset  . Cancer Mother        melanoma   . Cancer Maternal Grandfather        prostate cancer  . Alcohol abuse Other   . Arthritis Other   . Hypertension Other     PE: BP 120/60   Pulse 81   Ht 5\' 2"  (1.575 m)   Wt (!) 317 lb (143.8 kg)   SpO2 94%   BMI 57.98 kg/m  Body mass index is 57.98 kg/m. Wt Readings from Last 3 Encounters:  08/30/18 (!) 317 lb (143.8 kg)  08/19/18 (!) 317 lb (143.8 kg)  07/26/18 (!) 318 lb (144.2 kg)   Constitutional: overweight, in NAD Eyes: PERRLA, EOMI, no exophthalmos ENT: moist mucous membranes, no thyromegaly, no cervical lymphadenopathy Cardiovascular: RRR, No MRG Respiratory: CTA B Gastrointestinal: abdomen soft, NT, ND, BS+ Musculoskeletal: no deformities, strength intact in all 4 Skin: moist, warm, + bilateral stasis dermatitis legs Neurological: no tremor with outstretched hands, DTR normal in all 4  ASSESSMENT: 1. Hypothyroidism  2. PCOS  Testosterone was high >> (2015) indicating PCOS  Component     Latest Ref Rng 10/18/2013  Testosterone     10 - 70 ng/dL 78 (H)  Sex Hormone Binding     18 - 114 nmol/L 28  Testosterone Free     0.6 - 6.8 pg/mL 15.6 (H)  Testosterone-% Free     0.4 - 2.4 % 2.0  Androstenedione      93  DHEA-SO4     35 - 430 ug/dL 35    She has been tested for Cushing's sd. In 02/2012 by a Dexamethasone suppression test >> cortisol returned at 1.4 (normal). We also r/o Cushing's sd. 09/2013 by a normal 24h UFC: Component     Latest Ref Rng 10/25/2013  Cortisol (Ur), Free     4.0 - 50.0 mcg/24 h 26.4  Results received     0.63 - 2.50 g/24 h 1.83  Creatinine, Urine      59.9  Creatinine, 24H Ur     700 - 1800 mg/day 1798   She also had normal urine CA and MN (testing for 2nd HTN) >> no pheochromocytoma.  3. DM2   4.  Obesity  PLAN:  1. Patient with longstanding hypothyroidism - latest thyroid labs reviewed with pt >> normal 06/2018 - she continues on LT4 225 mcg daily, decreased from 262  at last visit - pt feels good on this dose. - we discussed about taking the thyroid hormone every day, with water, >30 minutes before breakfast, separated by >4 hours from acid reflux medications, calcium, iron, multivitamins. Pt. is taking it correctly. - will check thyroid tests at next visit  2. PCOS - She reached the age of menopause -she is off her IUD without having menstrual cycles -At last visit, since she had increased hair on face and alopecia, restarted spironolactone 25 mg twice a day.  She is also on an SGLT2 inhibitor and ARB so we checked a potassium level after we started spironolactone, but this was normal Lab Results  Component Value Date   K 3.8 04/30/2018  -She does not feel significantly decreased hair (only barely) after she started  back on spironolactone.  However, we discussed that we cannot increase the dose due to her concomitant medications and the risk of hyperkalemia.  3. DM2 -Sugars improved after we started Jardiance in the past, however, she had to stop due to muscle cramps and then a labial tear.  At last visit, sugars were higher after stopping Jardiance and she wanted to retry it.  We did so, at the lowest dose and I advised her to stay well-hydrated.  A potassium level obtained after we started Jardiance was normal. -At this visit, she is barely checking sugars.  I advised her to start checking more consistently.  The ones that she checked are at or close to goal.  We will continue the current regimen for now.  No side effects from Jardiance now. -Her HbA1c today is 7.8%, which is lower than before.  However, for her, fructosamine levels are more accurate.  Since the HbA1c decreased, I would not check a fructosamine level today. - I suggested:  Patient Instructions  Please continue: - Metformin ER 500 mg with dinner - Jardiance 10 mg before breakfast  Try Jackfruit.  Please continue Levothyroxine 225 g daily  Take the thyroid hormone every day, with  water, at least 30 minutes before breakfast, separated by at least 4 hours from: - acid reflux medications - calcium - iron - multivitamins  Please stop at the lab.  Continue spironolactone 25 mg twice a day  Please return in 6 months.   4.  Obesity -Continue Jardiance, which should also help with weight loss -I recommended a plant-based diet at last visit.  She started a more keto diet since last visit.  Weight is stable, did not lose more weight.  We discussed about improvement in her diet to reduce the fat content.  Given specific suggestions.   Philemon Kingdom, MD PhD [facility]

## 2018-08-30 NOTE — Addendum Note (Signed)
Addended by: Cardell Peach I on: 08/30/2018 04:42 PM   Modules accepted: Orders

## 2018-09-08 ENCOUNTER — Telehealth: Payer: Self-pay

## 2018-09-08 NOTE — Telephone Encounter (Signed)
Left message for patient to call back to confirm appointment on Monday, March 16th at 10:15am.

## 2018-09-13 ENCOUNTER — Ambulatory Visit: Payer: Medicare Other | Admitting: Family Medicine

## 2018-09-19 ENCOUNTER — Encounter: Payer: Self-pay | Admitting: Internal Medicine

## 2018-09-20 MED ORDER — LEVOTHYROXINE SODIUM 200 MCG PO TABS: ORAL_TABLET | ORAL | 5 refills | Status: DC

## 2018-09-20 MED ORDER — LEVOTHYROXINE SODIUM 25 MCG PO TABS: 25.0000 ug | ORAL_TABLET | Freq: Every day | ORAL | 5 refills | Status: DC

## 2018-09-20 MED ORDER — METFORMIN HCL ER 500 MG PO TB24: 500.0000 mg | ORAL_TABLET | Freq: Every day | ORAL | 3 refills | Status: DC

## 2018-09-24 ENCOUNTER — Other Ambulatory Visit: Payer: Self-pay | Admitting: Neurology

## 2018-09-24 MED ORDER — PREGABALIN 50 MG PO CAPS: 50.0000 mg | ORAL_CAPSULE | Freq: Two times a day (BID) | ORAL | 1 refills | Status: DC

## 2018-09-27 ENCOUNTER — Other Ambulatory Visit: Payer: Self-pay | Admitting: Neurology

## 2018-10-12 ENCOUNTER — Ambulatory Visit: Payer: Medicare Other | Admitting: Family Medicine

## 2018-11-08 ENCOUNTER — Other Ambulatory Visit: Payer: Self-pay | Admitting: Internal Medicine

## 2018-11-11 ENCOUNTER — Encounter: Payer: Self-pay | Admitting: Internal Medicine

## 2018-11-12 MED ORDER — ACCU-CHEK AVIVA PLUS W/DEVICE KIT: PACK | 0 refills | Status: DC

## 2018-11-12 MED ORDER — ACCU-CHEK FASTCLIX LANCETS MISC: 12 refills | Status: DC

## 2018-11-12 MED ORDER — GLUCOSE BLOOD VI STRP: ORAL_STRIP | 12 refills | Status: DC

## 2018-11-12 MED ORDER — ACCU-CHEK FASTCLIX LANCET KIT: PACK | 0 refills | Status: AC

## 2018-12-21 ENCOUNTER — Other Ambulatory Visit: Payer: Self-pay

## 2018-12-21 NOTE — Patient Outreach (Signed)
[facility]) Care Management  12/21/2018  Autumn Massey 1965/08/06 [id number]   Medication Adherence call to Autumn Massey Compliant Voice message left with a call back number. Autumn Massey is showing past due on Irbesartan 75 mg and Simvastatin 40 mg under [place].   [place] Management Direct Dial [phone]  Fax [phone] [email or web address]

## 2018-12-23 ENCOUNTER — Other Ambulatory Visit: Payer: Self-pay | Admitting: Internal Medicine

## 2019-01-10 ENCOUNTER — Encounter: Payer: Self-pay | Admitting: Neurology

## 2019-01-14 NOTE — Telephone Encounter (Signed)
error 

## 2019-01-16 NOTE — Progress Notes (Signed)
Virtual Visit via Video Note The purpose of this virtual visit is to provide medical care while limiting exposure to the novel coronavirus.    Consent was obtained for video visit:  Yes Answered questions that patient had about telehealth interaction:  Yes I discussed the limitations, risks, security and privacy concerns of performing an evaluation and management service by telemedicine. I also discussed with the patient that there may be a patient responsible charge related to this service. The patient expressed understanding and agreed to proceed.  Pt location: Home Physician Location: Home Name of referring provider:  Biagio Borg, MD I connected with [name] at patients initiation/request on 01/17/2019 at  3:30 PM EDT by video enabled telemedicine application and verified that I am speaking with the correct person using two identifiers. Pt MRN:  [id number] Pt DOB:  [birth year] Video Participants:  [name]   History of Present Illness:  Autumn Massey is a 53 year old right-handed woman with hypothyroidism, hyperlipidemia, impaired glucose tolerance, hypertension, hypoventilation, cerebrovascular disease and former smoker who follows up for essential tremor.  UPDATE:  She is taking primidone 150 mg at bedtime. For neuropathic pain, she is taking Lyrica 19m twice daily.  Tremors are less.  Neuropathy pain is unchanged.    HISTORY:  She underwent left ulnar decompression surgery in August 2014.  When the cast was removed the following month, she noted twitching of her fifth digit.  An MRI of the cervical spine was performed in December 2014, which revealed no structural etiology for radiculopathy.  She had a NCV-EMG in January 2015 which was essentially normal.  She continued to have numbness, paresthesias and weakness of the fourth and fifth digits of the left hand.  She later developed tremor in both hands, more noticeable in the right hand since she is  right-handed dominant.  It occurs when she is writing or holding utensils.  She denies gait difficulty, rigidity, lack of sense of smell, or history consistent with REM sleep behavior.  She is concerned about Parkinson's disease because her maternal grandfather and uncle had it.  Another uncle had tremor.  She has numbness and tingling in the toes and fingers.  She also notes electric pain in the right leg below the knee to the foot.  For neuropathic pain, she has tried gabapentin, Cymbalta, and Lyrica.  Past Medical History: Past Medical History:  Diagnosis Date  . ANXIETY DEPRESSION 01/29/2009  . DEPRESSION 11/19/2007  . [place] ([place]   . FREQUENCY, URINARY 10/17/2009  . HYPERLIPIDEMIA 11/19/2007  . HYPERTENSION 11/19/2007  . HYPOTHYROIDISM 11/19/2007  . Morbid obesity ([place]   . PNEUMONIA 08/06/2010  . POLYCYTHEMIA 01/29/2009  . Preventative health care 04/06/2011  . Sleep apnea    mild sleep apnea, on o2 at 2l at nighttime  . SLEEP RELATED HYPOVENTILATION/HYPOXEMIA CCE 02/08/2009  . Venous insufficiency 07/19/2012    Medications: Outpatient Encounter Medications as of 01/17/2019  Medication Sig  . Accu-Chek FastClix Lancets MISC Use to check blood sugar once a day  . acetaminophen (TYLENOL) 500 MG tablet Take 1,000 mg by mouth every 6 (six) hours as needed for mild pain, moderate pain, fever or headache.   . albuterol (PROAIR HFA) 108 (90 Base) MCG/ACT inhaler 2 puffs every 4 hours as needed only  if your can't catch your breath  . albuterol (PROVENTIL) (2.5 MG/3ML) 0.083% nebulizer solution Take 3 mLs (2.5 mg total) by nebulization every 6 (six) hours as needed for wheezing or shortness of breath.  .[name]  AMBULATORY NON FORMULARY MEDICATION Wheelchair  . aspirin EC 81 MG tablet Take 81 mg by mouth daily.  . bisoprolol-hydrochlorothiazide (ZIAC) 5-6.25 MG tablet TAKE 1 TABLET BY MOUTH  DAILY  . Blood Glucose Monitoring Suppl (ACCU-CHEK AVIVA PLUS) w/Device KIT Use to check blood sugar once a day   . Cholecalciferol (DIALYVITE VITAMIN D 5000 PO) Take 1 capsule by mouth daily.  . clonazePAM (KLONOPIN) 0.5 MG tablet Take 1 tablet (0.5 mg total) by mouth 2 (two) times daily as needed for anxiety.  . cyclobenzaprine (FLEXERIL) 5 MG tablet TAKE 1 TABLET BY MOUTH 3  TIMES DAILY AS NEEDED FOR  MUSCLE SPASM(S)  . FLUoxetine (PROZAC) 20 MG capsule TAKE 1 CAPSULE BY MOUTH  ONCE DAILY  . fluticasone furoate-vilanterol (BREO ELLIPTA) 100-25 MCG/INH AEPB INHALE 1 PUFF INTO THE LUNGS EVERY MORNING.  . furosemide (LASIX) 40 MG tablet TAKE 1 TABLET BY MOUTH  EVERY OTHER DAY AS NEEDED.  [name] GARLIC PO Take 1 tablet by mouth daily.  [name] glucose blood (ACCU-CHEK AVIVA) test strip Use to check blood sugar once a day  . irbesartan (AVAPRO) 75 MG tablet TAKE 1 TABLET BY MOUTH AT  BEDTIME  . JARDIANCE 10 MG TABS tablet TAKE 1 TABLET BY MOUTH  DAILY  . Lancets Misc. (ACCU-CHEK FASTCLIX LANCET) KIT Use to check blood sugar once a day  . levothyroxine (SYNTHROID, LEVOTHROID) 200 MCG tablet TAKE 1 TABLET BY MOUTH  DAILY..  . levothyroxine (SYNTHROID, LEVOTHROID) 25 MCG tablet Take 1 tablet (25 mcg total) by mouth daily before breakfast.  . metFORMIN (GLUCOPHAGE-XR) 500 MG 24 hr tablet Take 1 tablet (500 mg total) by mouth daily with breakfast.  . montelukast (SINGULAIR) 10 MG tablet TAKE 1 TABLET BY MOUTH AT  BEDTIME  . OXYGEN 2lpm with sleep only  [facility]  . pregabalin (LYRICA) 50 MG capsule Take 1 capsule (50 mg total) by mouth 2 (two) times daily.  . primidone (MYSOLINE) 50 MG tablet Take 3 tablets (150 mg total) by mouth at bedtime.  . simvastatin (ZOCOR) 40 MG tablet TAKE 1 TABLET BY MOUTH AT  BEDTIME DAILY  . spironolactone (ALDACTONE) 25 MG tablet TAKE 1 TABLET BY MOUTH  TWICE DAILY   No facility-administered encounter medications on file as of 01/17/2019.     Allergies: Allergies  Allergen Reactions  . Cymbalta [Duloxetine Hcl]     Family History: Family History  Problem Relation Age of Onset  . Cancer  Mother        melanoma   . Cancer Maternal Grandfather        prostate cancer  . Alcohol abuse Other   . Arthritis Other   . Hypertension Other     Social History: Social History   Socioeconomic History  . Marital status: Divorced    Spouse name: Not on file  . Number of children: 0  . Years of education: 61  . Highest education level: Not on file  Occupational History  . Occupation: Disabled    Employer: [employer]  Social Needs  . Financial resource strain: Not on file  . Food insecurity    Worry: Not on file    Inability: Not on file  . Transportation needs    Medical: Not on file    Non-medical: Not on file  Tobacco Use  . Smoking status: Former Smoker    Packs/day: 0.50    Years: 22.00    Pack years: 11.00    Types: Cigarettes    Quit date: 05/22/2015  Years since quitting: 3.6  . Smokeless tobacco: Never Used  Substance and Sexual Activity  . Alcohol use: Yes    Alcohol/week: 0.0 standard drinks    Comment: rare  . Drug use: No  . Sexual activity: Never    Birth control/protection: I.U.D.  Lifestyle  . Physical activity    Days per week: Not on file    Minutes per session: Not on file  . Stress: Not on file  Relationships  . Social Herbalist on phone: Not on file    Gets together: Not on file    Attends religious service: Not on file    Active member of club or organization: Not on file    Attends meetings of clubs or organizations: Not on file    Relationship status: Not on file  . Intimate partner violence    Fear of current or ex partner: Not on file    Emotionally abused: Not on file    Physically abused: Not on file    Forced sexual activity: Not on file  Other Topics Concern  . Not on file  Social History Narrative  . Not on file    Observations/Objective:   Height 5' 1" (1.549 m), weight (!) 310 lb (140.6 kg). No acute distress.  Alert and oriented.  Speech fluent and not dysarthric.  Language intact.  Eyes  orthophoric on primary gaze.  Face symmetric.  Assessment and Plan:   Benign essential tremor Polyneuropathy Morbid obesity (BMI 58.6)  1.  Increase Lyrica to 7m three times daily.  We can further titrate dose in 6 weeks if needed. 2.  Continue primidone 1560mat bedtime 3.  Encouraged to continue weight loss 4.  Follow up in 9 months  Follow Up Instructions:    -I discussed the assessment and treatment plan with the patient. The patient was provided an opportunity to ask questions and all were answered. The patient agreed with the plan and demonstrated an understanding of the instructions.   The patient was advised to call back or seek an in-person evaluation if the symptoms worsen or if the condition fails to improve as anticipated.    Total Time spent in visit with the patient was:  15 minutes   AdDudley MajorDO

## 2019-01-17 ENCOUNTER — Other Ambulatory Visit: Payer: Self-pay

## 2019-01-17 ENCOUNTER — Encounter: Payer: Self-pay | Admitting: Neurology

## 2019-01-17 ENCOUNTER — Telehealth (INDEPENDENT_AMBULATORY_CARE_PROVIDER_SITE_OTHER): Payer: Medicare Other | Admitting: Neurology

## 2019-01-17 VITALS — Ht 61.0 in | Wt 310.0 lb

## 2019-01-17 DIAGNOSIS — G250 Essential tremor: Secondary | ICD-10-CM | POA: Diagnosis not present

## 2019-01-17 DIAGNOSIS — G629 Polyneuropathy, unspecified: Secondary | ICD-10-CM

## 2019-01-17 MED ORDER — PREGABALIN 50 MG PO CAPS: 50.0000 mg | ORAL_CAPSULE | Freq: Three times a day (TID) | ORAL | 2 refills | Status: DC

## 2019-01-18 ENCOUNTER — Other Ambulatory Visit: Payer: Self-pay

## 2019-01-18 NOTE — Patient Outreach (Signed)
[facility]) Care Management  01/18/2019  TASFIA VASSEUR Feb 01, 1966 [id number]   Medication Adherence call to Mrs. [place] Compliant Voice message left with a call back number. Mrs Feld is showing past due on Irbesartan 75 mg under [place].   [place] Management Direct Dial [phone]  Fax [phone] [email or web address]

## 2019-01-26 ENCOUNTER — Ambulatory Visit (INDEPENDENT_AMBULATORY_CARE_PROVIDER_SITE_OTHER): Payer: Medicare Other | Admitting: Internal Medicine

## 2019-01-26 ENCOUNTER — Other Ambulatory Visit: Payer: Self-pay

## 2019-01-26 ENCOUNTER — Encounter: Payer: Self-pay | Admitting: Internal Medicine

## 2019-01-26 ENCOUNTER — Other Ambulatory Visit (INDEPENDENT_AMBULATORY_CARE_PROVIDER_SITE_OTHER): Payer: Medicare Other

## 2019-01-26 VITALS — BP 124/86 | HR 80 | Temp 98.5°F | Ht 61.0 in | Wt 308.0 lb

## 2019-01-26 DIAGNOSIS — E559 Vitamin D deficiency, unspecified: Secondary | ICD-10-CM

## 2019-01-26 DIAGNOSIS — E611 Iron deficiency: Secondary | ICD-10-CM

## 2019-01-26 DIAGNOSIS — E1165 Type 2 diabetes mellitus with hyperglycemia: Secondary | ICD-10-CM

## 2019-01-26 DIAGNOSIS — Z0000 Encounter for general adult medical examination without abnormal findings: Secondary | ICD-10-CM

## 2019-01-26 DIAGNOSIS — E538 Deficiency of other specified B group vitamins: Secondary | ICD-10-CM | POA: Diagnosis not present

## 2019-01-26 DIAGNOSIS — Z23 Encounter for immunization: Secondary | ICD-10-CM

## 2019-01-26 DIAGNOSIS — G629 Polyneuropathy, unspecified: Secondary | ICD-10-CM | POA: Insufficient documentation

## 2019-01-26 HISTORY — DX: Polyneuropathy, unspecified: G62.9

## 2019-01-26 LAB — MICROALBUMIN / CREATININE URINE RATIO
Creatinine,U: 77 mg/dL
Microalb Creat Ratio: 2.6 mg/g (ref 0.0–30.0)
Microalb, Ur: 2 mg/dL — ABNORMAL HIGH (ref 0.0–1.9)

## 2019-01-26 LAB — HEPATIC FUNCTION PANEL
ALT: 19 U/L (ref 0–35)
AST: 17 U/L (ref 0–37)
Albumin: 4.1 g/dL (ref 3.5–5.2)
Alkaline Phosphatase: 61 U/L (ref 39–117)
Bilirubin, Direct: 0.1 mg/dL (ref 0.0–0.3)
Total Bilirubin: 0.7 mg/dL (ref 0.2–1.2)
Total Protein: 7.7 g/dL (ref 6.0–8.3)

## 2019-01-26 LAB — BASIC METABOLIC PANEL
BUN: 23 mg/dL (ref 6–23)
CO2: 29 mEq/L (ref 19–32)
Calcium: 9.8 mg/dL (ref 8.4–10.5)
Chloride: 96 mEq/L (ref 96–112)
Creatinine, Ser: 0.75 mg/dL (ref 0.40–1.20)
GFR: 80.8 mL/min (ref >60.00)
Glucose, Bld: 150 mg/dL — ABNORMAL HIGH (ref 70–99)
Potassium: 4.5 mEq/L (ref 3.5–5.1)
Sodium: 136 mEq/L (ref 135–145)

## 2019-01-26 LAB — CBC WITH DIFFERENTIAL/PLATELET
Basophils Absolute: 0.1 10*3/uL (ref 0.0–0.1)
Basophils Relative: 0.8 % (ref 0.0–3.0)
Eosinophils Absolute: 0.3 10*3/uL (ref 0.0–0.7)
Eosinophils Relative: 2.4 % (ref 0.0–5.0)
HCT: 48.2 % — ABNORMAL HIGH (ref 36.0–46.0)
Hemoglobin: 15.8 g/dL — ABNORMAL HIGH (ref 12.0–15.0)
Lymphocytes Relative: 20.2 % (ref 12.0–46.0)
Lymphs Abs: 2.2 10*3/uL (ref 0.7–4.0)
MCHC: 32.8 g/dL (ref 30.0–36.0)
MCV: 91.2 fl (ref 78.0–100.0)
Monocytes Absolute: 0.8 10*3/uL (ref 0.1–1.0)
Monocytes Relative: 7.4 % (ref 3.0–12.0)
Neutro Abs: 7.5 10*3/uL (ref 1.4–7.7)
Neutrophils Relative %: 69.2 % (ref 43.0–77.0)
Platelets: 210 10*3/uL (ref 150.0–400.0)
RBC: 5.28 Mil/uL — ABNORMAL HIGH (ref 3.87–5.11)
RDW: 13.3 % (ref 11.5–15.5)
WBC: 10.8 10*3/uL — ABNORMAL HIGH (ref 4.0–10.5)

## 2019-01-26 LAB — LIPID PANEL
Cholesterol: 175 mg/dL (ref 0–200)
HDL: 36.1 mg/dL — ABNORMAL LOW (ref >39.00)
NonHDL: 138.61
Total CHOL/HDL Ratio: 5
Triglycerides: 203 mg/dL — ABNORMAL HIGH (ref 0.0–149.0)
VLDL: 40.6 mg/dL — ABNORMAL HIGH (ref 0.0–40.0)

## 2019-01-26 LAB — URINALYSIS, ROUTINE W REFLEX MICROSCOPIC
Bilirubin Urine: NEGATIVE
Ketones, ur: NEGATIVE
Leukocytes,Ua: NEGATIVE
Nitrite: POSITIVE — AB
Specific Gravity, Urine: 1.02 (ref 1.000–1.030)
Total Protein, Urine: NEGATIVE
Urine Glucose: >=1000 — AB
Urobilinogen, UA: 0.2 (ref 0.0–1.0)
pH: 5.5 (ref 5.0–8.0)

## 2019-01-26 LAB — TSH: TSH: 0.16 u[IU]/mL — ABNORMAL LOW (ref 0.35–4.50)

## 2019-01-26 LAB — LDL CHOLESTEROL, DIRECT: Direct LDL: 97 mg/dL

## 2019-01-26 LAB — HEMOGLOBIN A1C: Hgb A1c MFr Bld: 7.3 % — ABNORMAL HIGH (ref 4.6–6.5)

## 2019-01-26 LAB — IBC PANEL
Iron: 93 ug/dL (ref 42–145)
Saturation Ratios: 31.3 % (ref 20.0–50.0)
Transferrin: 212 mg/dL (ref 212.0–360.0)

## 2019-01-26 LAB — VITAMIN D 25 HYDROXY (VIT D DEFICIENCY, FRACTURES): VITD: 28.16 ng/mL — ABNORMAL LOW (ref 30.00–100.00)

## 2019-01-26 LAB — VITAMIN B12: Vitamin B-12: 275 pg/mL (ref 211–911)

## 2019-01-26 NOTE — Assessment & Plan Note (Signed)
stable overall by history and exam, recent data reviewed with pt, and pt to continue medical treatment as before,  to f/u any worsening symptoms or concerns  

## 2019-01-26 NOTE — Patient Instructions (Signed)
You had the Tdap tetanus shot today  Please continue all other medications as before, and refills have been done if requested.  Please have the pharmacy call with any other refills you may need.  Please continue your efforts at being more active, low cholesterol diet, and weight control.  You are otherwise up to date with prevention measures today.  Please keep your appointments with your specialists as you may have planned  Please go to the LAB in the Basement (turn left off the elevator) for the tests to be done today  You will be contacted by phone if any changes need to be made immediately.  Otherwise, you will receive a letter about your results with an explanation, but please check with MyChart first.  Please remember to sign up for MyChart if you have not done so, as this will be important to you in the future with finding out test results, communicating by private email, and scheduling acute appointments online when needed.  Please return in 1 year for your yearly visit, or sooner if needed

## 2019-01-26 NOTE — Progress Notes (Signed)
Subjective:    Patient ID: Autumn Massey, female    DOB: 03-13-1966, 53 y.o.   MRN: [id number]  HPI  Here for wellness and f/u;  Overall doing ok;  Pt denies Chest pain, worsening SOB, DOE, wheezing, orthopnea, PND, worsening LE edema, palpitations, dizziness or syncope.  Pt denies neurological change such as new headache, facial or extremity weakness.  Pt denies polydipsia, polyuria, or low sugar symptoms. Pt states overall good compliance with treatment and medications, good tolerability, and has been trying to follow appropriate diet.  Pt denies worsening depressive symptoms, suicidal ideation or panic. No fever, night sweats, wt loss, loss of appetite, or other constitutional symptoms.  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  Has seen Dr Christophe Louis last wk, lyrica increased to tid for neuropathic pain causing gait worsening.  Seems to be helping already.  Actively trying to lose wt with intermittent fasting.  Has lost several lbs.   Wt Readings from Last 3 Encounters:  01/26/19 (!) 308 lb (139.7 kg)  01/17/19 (!) 310 lb (140.6 kg)  08/30/18 (!) 317 lb (143.8 kg)  Denies worsening depressive symptoms, suicidal ideation, or panic; has ongoing anxiety, prozac working well per pt.  Bp at home ave about 110/62.  CBC usually in the low 100's, has f/u Endo for DM and thyroid in august. No new complaints Past Medical History:  Diagnosis Date  . ANXIETY DEPRESSION 01/29/2009  . DEPRESSION 11/19/2007  . Diabetes ([place])   . FREQUENCY, URINARY 10/17/2009  . HYPERLIPIDEMIA 11/19/2007  . HYPERTENSION 11/19/2007  . HYPOTHYROIDISM 11/19/2007  . Morbid obesity ([place])   . Neuropathy 01/26/2019  . PNEUMONIA 08/06/2010  . POLYCYTHEMIA  01/29/2009  . Preventative health care 04/06/2011  . Sleep apnea    mild sleep apnea, on o2 at 2l at nighttime  . SLEEP RELATED HYPOVENTILATION/HYPOXEMIA CCE 02/08/2009  . Venous insufficiency 07/19/2012   Past Surgical History:  Procedure Laterality Date  . TONSILLECTOMY    . ULNAR TUNNEL RELEASE Left 02/22/2013   Procedure: LEFT ULNAR NERVE DECOMPRESSION ;  Surgeon: Tennis Must, MD;  Location: [facility];  Service: Orthopedics;  Laterality: Left;    reports that she quit smoking about 3 years ago. Her smoking use included cigarettes. She has a 11.00 pack-year smoking history. She has never used smokeless tobacco. She reports current alcohol use. She reports that she does not use drugs. family history includes Alcohol abuse in an other family member; Arthritis in an other family member; Cancer in her maternal grandfather and mother; High Cholesterol in her sister; Hypertension in an other family member; Osteoarthritis (age of onset: 53) in her maternal grandmother; Psoriasis in her brother; Scoliosis in her maternal grandmother. Allergies  Allergen Reactions  . Cymbalta [Duloxetine Hcl]    Current Outpatient Medications on File Prior to Visit  Medication Sig Dispense Refill  . Accu-Chek FastClix Lancets MISC Use to check blood sugar once a day 100 each 12  . acetaminophen (TYLENOL) 500 MG tablet Take 1,000 mg by mouth every 6 (six) hours as needed for mild pain, moderate pain, fever or headache.     . albuterol (PROAIR HFA) 108 (90 Base) MCG/ACT inhaler 2 puffs every  4 hours as needed only  if your can't catch your breath 1 Inhaler 5  . albuterol (PROVENTIL) (2.5 MG/3ML) 0.083% nebulizer solution Take 3 mLs (2.5 mg total) by nebulization every 6 (six) hours as needed for wheezing or shortness of breath. 75 mL 0  . AMBULATORY NON FORMULARY MEDICATION Wheelchair 1 Piece 0  . aspirin EC 81 MG tablet Take 81 mg by mouth daily.    . bisoprolol-hydrochlorothiazide (ZIAC) 5-6.25 MG  tablet TAKE 1 TABLET BY MOUTH  DAILY 90 tablet 3  . Blood Glucose Monitoring Suppl (ACCU-CHEK AVIVA PLUS) w/Device KIT Use to check blood sugar once a day 1 kit 0  . clonazePAM (KLONOPIN) 0.5 MG tablet Take 1 tablet (0.5 mg total) by mouth 2 (two) times daily as needed for anxiety. 60 tablet 5  . Coenzyme Q10-Vitamin E (COQ10-VITAMIN E) 200-20 MG-UNIT CAPS Take by mouth.    . cyclobenzaprine (FLEXERIL) 5 MG tablet TAKE 1 TABLET BY MOUTH 3  TIMES DAILY AS NEEDED FOR  MUSCLE SPASM(S) 270 tablet 1  . FLUoxetine (PROZAC) 20 MG capsule TAKE 1 CAPSULE BY MOUTH  ONCE DAILY 90 capsule 3  . fluticasone furoate-vilanterol (BREO ELLIPTA) 100-25 MCG/INH AEPB INHALE 1 PUFF INTO THE LUNGS EVERY MORNING. 90 each 0  . furosemide (LASIX) 40 MG tablet TAKE 1 TABLET BY MOUTH  EVERY OTHER DAY AS NEEDED. 45 tablet 1  . GARLIC PO Take 1 tablet by mouth daily.    [name] glucose blood (ACCU-CHEK AVIVA) test strip Use to check blood sugar once a day 100 each 12  . irbesartan (AVAPRO) 75 MG tablet TAKE 1 TABLET BY MOUTH AT  BEDTIME 90 tablet 3  . JARDIANCE 10 MG TABS tablet TAKE 1 TABLET BY MOUTH  DAILY 60 tablet 2  . Lancets Misc. (ACCU-CHEK FASTCLIX LANCET) KIT Use to check blood sugar once a day 1 kit 0  . levothyroxine (SYNTHROID, LEVOTHROID) 200 MCG tablet TAKE 1 TABLET BY MOUTH  DAILY.. 60 tablet 5  . levothyroxine (SYNTHROID, LEVOTHROID) 25 MCG tablet Take 1 tablet (25 mcg total) by mouth daily before breakfast. 45 tablet 5  . metFORMIN (GLUCOPHAGE-XR) 500 MG 24 hr tablet Take 1 tablet (500 mg total) by mouth daily with breakfast. 90 tablet 3  . montelukast (SINGULAIR) 10 MG tablet TAKE 1 TABLET BY MOUTH AT  BEDTIME 90 tablet 3  . OXYGEN 2lpm with sleep only  [facility]    . pregabalin (LYRICA) 50 MG capsule Take 1 capsule (50 mg total) by mouth 3 (three) times daily. 270 capsule 2  . primidone (MYSOLINE) 50 MG tablet Take 3 tablets (150 mg total) by mouth at bedtime. 90 tablet 5  . simvastatin (ZOCOR) 40 MG tablet TAKE 1  TABLET BY MOUTH AT  BEDTIME DAILY 90 tablet 3  . spironolactone (ALDACTONE) 25 MG tablet TAKE 1 TABLET BY MOUTH  TWICE DAILY 180 tablet 0   No current facility-administered medications on file prior to visit.    Review of Systems Constitutional: Negative for other unusual diaphoresis, sweats, appetite or weight changes HENT: Negative for other worsening hearing loss, ear pain, facial swelling, mouth sores or neck stiffness.   Eyes: Negative for other worsening pain, redness or other visual disturbance.  Respiratory: Negative for other stridor or swelling Cardiovascular: Negative for other palpitations or other chest pain  Gastrointestinal: Negative for worsening diarrhea or loose stools, blood in stool, distention or other pain Genitourinary: Negative for hematuria, flank pain or other change in urine volume.  Musculoskeletal: Negative for  myalgias or other joint swelling.  Skin: Negative for other color change, or other wound or worsening drainage.  Neurological: Negative for other syncope or numbness. Hematological: Negative for other adenopathy or swelling Psychiatric/Behavioral: Negative for hallucinations, other worsening agitation, SI, self-injury, or new decreased concentration All other system neg per pt    Objective:   Physical Exam BP 124/86   Pulse 80   Temp 98.5 F (36.9 C) (Oral)   Ht _0  (1.549 m)   Wt (!) 308 lb (139.7 kg)   SpO2 92%   BMI 58.20 kg/m  VS noted,  Constitutional: Pt is oriented to person, place, and time. Appears well-developed and well-nourished, in no significant distress and comfortable Head: Normocephalic and atraumatic  Eyes: Conjunctivae and EOM are normal. Pupils are equal, round, and reactive to light Right Ear: External ear normal without discharge Left Ear: External ear normal without discharge Nose: Nose without discharge or deformity Mouth/Throat: Oropharynx is without other ulcerations and moist  Neck: Normal range of motion. Neck  supple. No JVD present. No tracheal deviation present or significant neck LA or mass Cardiovascular: Normal rate, regular rhythm, normal heart sounds and intact distal pulses.   Pulmonary/Chest: WOB normal and breath sounds without rales or wheezing  Abdominal: Soft. Bowel sounds are normal. NT. No HSM  Musculoskeletal: Normal range of motion. Exhibits no edema Lymphadenopathy: Has no other cervical adenopathy.  Neurological: Pt is alert and oriented to person, place, and time. Pt has normal reflexes. No cranial nerve deficit. Motor grossly intact, Gait intact Skin: Skin is warm and dry. No rash noted or new ulcerations Psychiatric:  Has normal mood and affect. Behavior is normal without agitation No other exam findings Lab Results  Component Value Date   WBC 10.8 (H) 01/26/2019   HGB 15.8 (H) 01/26/2019   HCT 48.2 (H) 01/26/2019   PLT 210.0 01/26/2019   GLUCOSE 150 (H) 01/26/2019   CHOL 175 01/26/2019   TRIG 203.0 (H) 01/26/2019   HDL 36.10 (L) 01/26/2019   LDLDIRECT 97.0 01/26/2019   LDLCALC 65 10/15/2016   ALT 19 01/26/2019   AST 17 01/26/2019   NA 136 01/26/2019   K 4.5 01/26/2019   CL 96 01/26/2019   CREATININE 0.75 01/26/2019   BUN 23 01/26/2019   CO2 29 01/26/2019   TSH 0.16 (L) 01/26/2019   HGBA1C 7.3 (H) 01/26/2019   MICROALBUR 2.0 (H) 01/26/2019      Assessment & Plan:

## 2019-01-26 NOTE — Assessment & Plan Note (Signed)

## 2019-01-27 ENCOUNTER — Encounter: Payer: Self-pay | Admitting: Internal Medicine

## 2019-01-27 ENCOUNTER — Telehealth: Payer: Self-pay

## 2019-01-27 ENCOUNTER — Other Ambulatory Visit: Payer: Self-pay | Admitting: Internal Medicine

## 2019-01-27 MED ORDER — VITAMIN D (ERGOCALCIFEROL) 1.25 MG (50000 UNIT) PO CAPS: 50000.0000 [IU] | ORAL_CAPSULE | ORAL | 0 refills | Status: DC

## 2019-01-27 MED ORDER — CIPROFLOXACIN HCL 500 MG PO TABS: 500.0000 mg | ORAL_TABLET | Freq: Two times a day (BID) | ORAL | 0 refills | Status: AC

## 2019-01-27 NOTE — Telephone Encounter (Signed)
Ok [facility]  I think I [facility] the [facility] d [facility]

## 2019-01-27 NOTE — Telephone Encounter (Signed)
-----   Message from Biagio Borg, MD sent at 01/27/2019  8:21 AM EDT ----- Left message on MyChart, pt to cont same tx except  The test results show that your current treatment is OK, except the Vitamin D level is low, and the urine testing seems consistent with infection.    We need to: 1)  Please take Vitamin D 50000 units weekly for 12 weeks, then plan to change to OTC Vitamin D3 at 2000 units per day, indefinitely. 2)  Treat the urine with antibiotic - I will send.    Temple Ewart to please inform pt, I will do rX X 2

## 2019-01-27 NOTE — Telephone Encounter (Signed)
Pt has viewed results via MyChart  

## 2019-02-07 ENCOUNTER — Other Ambulatory Visit: Payer: Self-pay

## 2019-02-07 NOTE — Patient Outreach (Signed)
[facility]) Care Management  02/07/2019  Autumn Massey Jun 19, 1966 [id number]   Medication Adherence call to Autumn Massey Compliant Voice message left with a call back number. Autumn Massey is showing past due on Irbesartan 75 mg under [place].   [place] Management Direct Dial [phone]  Fax [phone] [email or web address]

## 2019-02-15 ENCOUNTER — Other Ambulatory Visit: Payer: Self-pay | Admitting: Internal Medicine

## 2019-02-15 ENCOUNTER — Other Ambulatory Visit: Payer: Self-pay | Admitting: Neurology

## 2019-02-25 ENCOUNTER — Other Ambulatory Visit: Payer: Self-pay

## 2019-03-01 ENCOUNTER — Other Ambulatory Visit: Payer: Self-pay

## 2019-03-01 ENCOUNTER — Ambulatory Visit (INDEPENDENT_AMBULATORY_CARE_PROVIDER_SITE_OTHER): Payer: Medicare Other | Admitting: Internal Medicine

## 2019-03-01 ENCOUNTER — Encounter: Payer: Self-pay | Admitting: Internal Medicine

## 2019-03-01 VITALS — BP 122/68 | HR 85 | Ht 61.0 in | Wt 309.0 lb

## 2019-03-01 DIAGNOSIS — E039 Hypothyroidism, unspecified: Secondary | ICD-10-CM | POA: Diagnosis not present

## 2019-03-01 DIAGNOSIS — E1165 Type 2 diabetes mellitus with hyperglycemia: Secondary | ICD-10-CM | POA: Diagnosis not present

## 2019-03-01 DIAGNOSIS — E282 Polycystic ovarian syndrome: Secondary | ICD-10-CM | POA: Diagnosis not present

## 2019-03-01 MED ORDER — ACCU-CHEK FASTCLIX LANCETS MISC: 3 refills | Status: DC

## 2019-03-01 MED ORDER — GLUCOSE BLOOD VI STRP: ORAL_STRIP | 3 refills | Status: DC

## 2019-03-01 NOTE — Progress Notes (Addendum)
Patient ID: Autumn Massey, female   DOB: 1966/05/03, 53 y.o.   MRN: [id number]   HPI  Autumn Massey is a 53 y.o.-year-old female, returning for f/u for DM2, hypothyroidism, and PCOS. Last visit 6 months ago. On disability now.  PCOS: Reviewed history:  - weight gain: Maximal weight 364  - was on Phentermine, which worked initially, then stopped working  - Would consider gastric bypass but only as a last resort - + hirsutism >> started to see facial hair in her 30s - + hair loss >> female pattern, and bald spot in vertex - No acne - had a Mirena IUD placed 04/2013 for heavy menstrual cycles >> out now; no menses since IUD out - has frequent Prednisone tapers    She was previously on spironolactone 50 mg twice daily but her potassium increased so we had to decrease the dose to 25 mg twice daily.  We ended up stopping spironolactone 11/2016.  However, she started to have more facial hair and more frontal balding and we restarted spironolactone 25 mg twice a day.  Potassium normal now: Lab Results  Component Value Date   K 4.5 01/26/2019   Hypothyroidism: Pt. has been dx with hypothyroidism in 2005.  Pt is on levothyroxine 225 mcg daily, taken: - in am - fasting - at least 30 min from b'fast - no Ca, Fe, MVI, PPIs - not on Biotin  Reviewed patient's TFTs: Lab Results  Component Value Date   TSH 0.16 (L) 01/26/2019   TSH 0.65 07/26/2018   TSH 6.36 (H) 04/30/2018   TSH 0.16 (L) 03/02/2018   TSH 0.26 (L) 01/21/2018   TSH 1.29 05/05/2017   TSH 5.30 (H) 03/10/2017   TSH 8.74 (H) 12/01/2016   TSH 14.632 (H) 10/15/2016   TSH 9.96 (H) 04/22/2016   FREET4 1.21 07/26/2018   FREET4 0.97 04/30/2018   FREET4 1.32 03/02/2018   FREET4 1.25 05/05/2017   FREET4 1.02 03/10/2017   FREET4 0.86 12/01/2016   FREET4 1.05 10/15/2016   FREET4 0.77 04/22/2016   FREET4 1.08 07/23/2015   FREET4 0.91 11/22/2014    Pt denies: - feeling nodules in neck - hoarseness - dysphagia -  choking - SOB with lying down  She was on Neurontin for tremors, but had to decrease the dose because of somnolence and then she was able to stop. She is on primidone >> restarted by Dr. Tomi Likens.  She is on Lyrica at night.  DM2:  Reviewed HbA1c: 03/09/2018: HbA1c calculated from fructosamine is better, at 5.5%! 09/02/2017: HbA1c calculated from Fructosamine is 6.1%. 05/05/2017: HbA1c calculated from Fructosamine is 6.00%. 03/10/2017: HbA1c calculated from Fructosamine is 6.17% (great!). 11/2016: HbA1c calculated from the fructosamine is radically better, at 5.77%! Lab Results  Component Value Date   HGBA1C 7.3 (H) 01/26/2019   HGBA1C 7.8 (A) 08/30/2018   HGBA1C 8.6 08/31/2017   She is on: - Metformin 1000 mg with dinner >> 500 mg with dinner >> in am - tolerates this well in am  - Jardiance 10 mg in the pm -  started 02/2017 >> stopped b/c UTI and also had a tear on her labia and was afraid that this could have gotten infected if she continued with Jardiance.  We restarted Jardiance in 02/2018, which she now tolerates well.  Not taking Januvia >> not covered.  She checks sugars seldom: - am: 101-122 >> 127-140 >> 124-134, 140 >> 131 >> n/c - lunch: n/c - after lunch:  135-147, 152 >>  n/c >> 145-150 >> 142 >> 131-152 - dinner: n/c >> 120-140, 157 >> n/c - after dinner: 160-170, 188 >> 140-150 >> up to 160 >> 108  She quit smoking 05/2015.  On Vitamin D 50,000 units weekly x 6 weeks.  + increased spotting >> will see ObGyn in 1 week.  ROS: + See HPI Constitutional: no weight gain/+ weight loss, no fatigue, no subjective hyperthermia, no subjective hypothermia Eyes: no blurry vision, no xerophthalmia ENT: no sore throat, + see HPI Cardiovascular: no CP/no SOB/no palpitations/no leg swelling Respiratory: no cough/no SOB/no wheezing Gastrointestinal: no N/no V/no D/no C/no acid reflux Musculoskeletal: no muscle aches/no joint aches Skin: no rashes, no hair loss Neurological: no  tremors/no numbness/no tingling/no dizziness  I reviewed pt's medications, allergies, PMH, social hx, family hx, and changes were documented in the history of present illness. Otherwise, unchanged from my initial visit note.  Past Medical History:  Diagnosis Date  . ANXIETY DEPRESSION 01/29/2009  . DEPRESSION 11/19/2007  . Diabetes ([place])   . FREQUENCY, URINARY 10/17/2009  . HYPERLIPIDEMIA 11/19/2007  . HYPERTENSION 11/19/2007  . HYPOTHYROIDISM 11/19/2007  . Morbid obesity ([place])   . Neuropathy 01/26/2019  . PNEUMONIA 08/06/2010  . POLYCYTHEMIA 01/29/2009  . Preventative health care 04/06/2011  . Sleep apnea    mild sleep apnea, on o2 at 2l at nighttime  . SLEEP RELATED HYPOVENTILATION/HYPOXEMIA CCE 02/08/2009  . Venous insufficiency 07/19/2012   Past Surgical History:  Procedure Laterality Date  . TONSILLECTOMY    . ULNAR TUNNEL RELEASE Left 02/22/2013   Procedure: LEFT ULNAR NERVE DECOMPRESSION ;  Surgeon: Tennis Must, MD;  Location: [place];  Service: Orthopedics;  Laterality: Left;   Social History   Socioeconomic History  . Marital status: Divorced    Spouse name: Not on file  . Number of children: 0  . Years of education: 66  . Highest education level: 12th grade  Occupational History  . Occupation: Disabled    Employer: [employer]  Social Needs  . Financial resource strain: Not on file  . Food insecurity    Worry: Not on file    Inability: Not on file  . Transportation needs    Medical: Not on file    Non-medical: Not on file  Tobacco Use  . Smoking status: Former Smoker    Packs/day: 0.50    Years: 22.00    Pack years: 11.00    Types: Cigarettes    Quit date: 05/22/2015    Years since quitting: 3.7  . Smokeless tobacco: Never Used  Substance and Sexual Activity  . Alcohol use: Yes    Alcohol/week: 0.0 standard drinks    Comment: rare  . Drug use: No  . Sexual activity: Never    Birth control/protection: I.U.D.  Lifestyle  . Physical  activity    Days per week: Not on file    Minutes per session: Not on file  . Stress: Not on file  Relationships  . Social Herbalist on phone: Not on file    Gets together: Not on file    Attends religious service: Not on file    Active member of club or organization: Not on file    Attends meetings of clubs or organizations: Not on file    Relationship status: Not on file  . Intimate partner violence    Fear of current or ex partner: Not on file    Emotionally abused: Not on file  Physically abused: Not on file    Forced sexual activity: Not on file  Other Topics Concern  . Not on file  Social History Narrative   Patient is right-handed.   Current Outpatient Medications on File Prior to Visit  Medication Sig Dispense Refill  . Accu-Chek FastClix Lancets MISC Use to check blood sugar once a day 100 each 12  . acetaminophen (TYLENOL) 500 MG tablet Take 1,000 mg by mouth every 6 (six) hours as needed for mild pain, moderate pain, fever or headache.     . albuterol (PROAIR HFA) 108 (90 Base) MCG/ACT inhaler 2 puffs every 4 hours as needed only  if your can't catch your breath 1 Inhaler 5  . albuterol (PROVENTIL) (2.5 MG/3ML) 0.083% nebulizer solution Take 3 mLs (2.5 mg total) by nebulization every 6 (six) hours as needed for wheezing or shortness of breath. 75 mL 0  . AMBULATORY NON FORMULARY MEDICATION Wheelchair 1 Piece 0  . aspirin EC 81 MG tablet Take 81 mg by mouth daily.    . bisoprolol-hydrochlorothiazide (ZIAC) 5-6.25 MG tablet TAKE 1 TABLET BY MOUTH  DAILY 90 tablet 3  . Blood Glucose Monitoring Suppl (ACCU-CHEK AVIVA PLUS) w/Device KIT Use to check blood sugar once a day 1 kit 0  . clonazePAM (KLONOPIN) 0.5 MG tablet Take 1 tablet (0.5 mg total) by mouth 2 (two) times daily as needed for anxiety. 60 tablet 5  . Coenzyme Q10-Vitamin E (COQ10-VITAMIN E) 200-20 MG-UNIT CAPS Take by mouth.    . cyclobenzaprine (FLEXERIL) 5 MG tablet TAKE 1 TABLET BY MOUTH 3  TIMES  DAILY AS NEEDED FOR  MUSCLE SPASM(S) 270 tablet 1  . FLUoxetine (PROZAC) 20 MG capsule TAKE 1 CAPSULE BY MOUTH  ONCE DAILY 90 capsule 3  . fluticasone furoate-vilanterol (BREO ELLIPTA) 100-25 MCG/INH AEPB INHALE 1 PUFF INTO THE LUNGS EVERY MORNING. 90 each 0  . furosemide (LASIX) 40 MG tablet TAKE 1 TABLET BY MOUTH  EVERY OTHER DAY AS NEEDED. 45 tablet 1  . GARLIC PO Take 1 tablet by mouth daily.    [name] glucose blood (ACCU-CHEK AVIVA) test strip Use to check blood sugar once a day 100 each 12  . irbesartan (AVAPRO) 75 MG tablet TAKE 1 TABLET BY MOUTH AT  BEDTIME 90 tablet 3  . JARDIANCE 10 MG TABS tablet TAKE 1 TABLET BY MOUTH  DAILY 60 tablet 2  . Lancets Misc. (ACCU-CHEK FASTCLIX LANCET) KIT Use to check blood sugar once a day 1 kit 0  . levothyroxine (SYNTHROID, LEVOTHROID) 200 MCG tablet TAKE 1 TABLET BY MOUTH  DAILY.. 60 tablet 5  . levothyroxine (SYNTHROID, LEVOTHROID) 25 MCG tablet Take 1 tablet (25 mcg total) by mouth daily before breakfast. 45 tablet 5  . metFORMIN (GLUCOPHAGE-XR) 500 MG 24 hr tablet Take 1 tablet (500 mg total) by mouth daily with breakfast. 90 tablet 3  . montelukast (SINGULAIR) 10 MG tablet TAKE 1 TABLET BY MOUTH AT  BEDTIME 90 tablet 3  . OXYGEN 2lpm with sleep only  [facility]    . pregabalin (LYRICA) 50 MG capsule Take 1 capsule (50 mg total) by mouth 3 (three) times daily. 270 capsule 2  . primidone (MYSOLINE) 50 MG tablet TAKE 3 TABLETS BY MOUTH AT  BEDTIME 90 tablet 11  . simvastatin (ZOCOR) 40 MG tablet TAKE 1 TABLET BY MOUTH AT  BEDTIME DAILY 90 tablet 3  . spironolactone (ALDACTONE) 25 MG tablet TAKE 1 TABLET BY MOUTH  TWICE DAILY 180 tablet 0  . Vitamin  D, Ergocalciferol, (DRISDOL) 1.25 MG (50000 UT) CAPS capsule Take 1 capsule (50,000 Units total) by mouth every 7 (seven) days. 12 capsule 0   No current facility-administered medications on file prior to visit.    Allergies  Allergen Reactions  . Cymbalta [Duloxetine Hcl]    Family History  Problem Relation  Age of Onset  . Cancer Mother        melanoma   . Cancer Maternal Grandfather        prostate cancer  . Alcohol abuse Other   . Arthritis Other   . Hypertension Other   . High Cholesterol Sister   . Psoriasis Brother   . Osteoarthritis Maternal Grandmother 99  . Scoliosis Maternal Grandmother     PE: BP 122/68   Pulse 85   Ht '5\' 1"'  (1.549 m)   Wt (!) 309 lb (140.2 kg)   SpO2 95%   BMI 58.39 kg/m  Body mass index is 58.39 kg/m. Wt Readings from Last 3 Encounters:  03/01/19 (!) 309 lb (140.2 kg)  01/26/19 (!) 308 lb (139.7 kg)  01/17/19 (!) 310 lb (140.6 kg)   Constitutional: overweight, in NAD Eyes: PERRLA, EOMI, no exophthalmos ENT: moist mucous membranes, no thyromegaly, no cervical lymphadenopathy Cardiovascular: RRR, No MRG Respiratory: CTA B Gastrointestinal: abdomen soft, NT, ND, BS+ Musculoskeletal: no deformities, strength intact in all 4 Skin: moist, warm, + B stasis dermatitis on shins Neurological: no tremor with outstretched hands, DTR normal in all 4   ASSESSMENT: 1. Hypothyroidism  2. PCOS  Testosterone was high >> (2015) indicating PCOS  Component     Latest Ref Rng 10/18/2013  Testosterone     10 - 70 ng/dL 78 (H)  Sex Hormone Binding     18 - 114 nmol/L 28  Testosterone Free     0.6 - 6.8 pg/mL 15.6 (H)  Testosterone-% Free     0.4 - 2.4 % 2.0  Androstenedione      93  DHEA-SO4     35 - 430 ug/dL 35  She has been tested for Cushing's sd. In 02/2012 by a Dexamethasone suppression test >> cortisol returned at 1.4 (normal). We also r/o Cushing's sd. 09/2013 by a normal 24h UFC: Component     Latest Ref Rng 10/25/2013  Cortisol (Ur), Free     4.0 - 50.0 mcg/24 h 26.4  Results received     0.63 - 2.50 g/24 h 1.83  Creatinine, Urine      59.9  Creatinine, 24H Ur     700 - 1800 mg/day 1798   She also had normal urine CA and MN (testing for 2nd HTN) >> no pheochromocytoma.  3. DM2   4.  Obesity  PLAN:  1. Patient with longstanding  hypothyroidism - latest thyroid labs reviewed with pt >> TSH was low at the end of 12/2018 at last check by PCP.  I was not aware of the result so she continues on the same dose of levothyroxine.  We discussed that this could be related to her continued weight loss and decreased reliance on for levothyroxine. - she is on LT4 225 mcg daily - pt feels good on this dose but wonders whether we need to decrease the levothyroxine dose or not - we discussed about taking the thyroid hormone every day, with water, >30 minutes before breakfast, separated by >4 hours from acid reflux medications, calcium, iron, multivitamins. Pt. is taking it correctly. - will check thyroid tests today: TSH and fT4 and if  TSH is still suppressed, will decrease levothyroxine to only 200 mcg daily - If labs are abnormal, she will need to return for repeat TFTs in 1.5 months  2. PCOS -She reached the age of menopause - she is off her IUD without heavy menstrual cycles -Since she had increased facial hair and alopecia, we restarted Spironolactone 25 milligrams twice a day.  She is also on SGLT2 inhibitor and ARB so we have to be careful with her potassium.  Latest level was normal a month ago: Lab Results  Component Value Date   K 4.5 01/26/2019  -She does not feel significantly decreased hair (only barely) after she started back on spironolactone.  However, she is aware that weight cannot increase the dose due to her concomitant medications and the risk of hyperkalemia.  3. DM2 -Sugars improved after we started Jardiance in the past, however, she had to stop due to muscle cramps and then a labial tear.  In the past, sugars increased after starting Jardiance and we restarted this before last visit.  She tolerated well. -At last visit, she was barely checking sugars.  I advised her to start checking more consistently.  We continue Jardiance. -At last visit, HbA1c was 7.8%, lower than before.  We did not check a fructosamine level  then, however, this is more accurate for her. -At this visit, he does me that she is tolerating metformin better in a.m.  However, she takes Jardiance in the afternoon discussed about moving this before breakfast -We will check a fructosamine level today but per review of her sugars at home, these are at goal per review of her meter download - I suggested: Patient Instructions  Please continue: - Metformin ER 500 mg with dinner - Jardiance 10 mg before breakfast  Please continue Levothyroxine 225 g daily  Take the thyroid hormone every day, with water, at least 30 minutes before breakfast, separated by at least 4 hours from: - acid reflux medications - calcium - iron - multivitamins  Please stop at the lab.  Continue spironolactone 25 mg twice a day  Please return in 6 months.   4.  Obesity -Continue Jardiance, which should also help with weight loss - lost 9 lbs since last visit, now 8 more lbs since last visit -I recommended a lower fat diet at last visit.  She is now doing a partially keto diet + intermittent fasting 16-8.  Office Visit on 03/01/2019  Component Date Value Ref Range Status  . TSH 03/01/2019 0.19* 0.35 - 4.50 uIU/mL Final  . Free T4 03/01/2019 1.23  0.60 - 1.60 ng/dL Final   Comment: Specimens from patients who are undergoing biotin therapy and /or ingesting biotin supplements may contain high levels of biotin.  The higher biotin concentration in these specimens interferes with this Free T4 assay.  Specimens that contain high levels  of biotin may cause false high results for this Free T4 assay.  Please interpret results in light of the total clinical presentation of the patient.    . Fructosamine 03/01/2019 221  205 - 285 umol/L Final   TSH is still low so we will reduce her levothyroxine dose to 200 mcg daily and recheck her test in 1.5 months. HbA1c calculated from fructosamine is excellent at 5.4%!   Philemon Kingdom, MD PhD [facility]

## 2019-03-01 NOTE — Patient Instructions (Signed)
Please continue: - Metformin ER 500 mg with dinner - Jardiance 10 mg before breakfast  Please continue Levothyroxine 225 g daily  Take the thyroid hormone every day, with water, at least 30 minutes before breakfast, separated by at least 4 hours from: - acid reflux medications - calcium - iron - multivitamins  Please stop at the lab.  Continue spironolactone 25 mg twice a day  Please return in 6 months.

## 2019-03-02 LAB — T4, FREE: Free T4: 1.23 ng/dL (ref 0.60–1.60)

## 2019-03-02 LAB — TSH: TSH: 0.19 u[IU]/mL — ABNORMAL LOW (ref 0.35–4.50)

## 2019-03-06 LAB — FRUCTOSAMINE: Fructosamine: 221 umol/L (ref 205–285)

## 2019-03-08 DIAGNOSIS — H9190 Unspecified hearing loss, unspecified ear: Secondary | ICD-10-CM | POA: Insufficient documentation

## 2019-03-08 DIAGNOSIS — F411 Generalized anxiety disorder: Secondary | ICD-10-CM | POA: Insufficient documentation

## 2019-04-07 NOTE — H&P (Signed)
NAME: Autumn Massey, Cesario MEDICAL RECORD [id number] ACCOUNT [id number] DATE OF BIRTH:1966-04-30 FACILITY: [facility] LOCATION:  PHYSICIAN:Tarris Delbene Garry Heater, MD  HISTORY AND PHYSICAL  DATE OF ADMISSION:  04/28/2019  CHIEF COMPLAINT:  Heavy irregular vaginal bleeding.  HISTORY OF PRESENT ILLNESS:  A 53 year old perimenopausal G2 P0 A2, LC0 who was evaluated recently for some irregular bleeding.  At that time, her [facility] was normal at 14.  Ultrasound was carried out.  That showed several small intramural fibroids 1.4, 1.0,  1.2 x 3.4 cm. On saline infusion, there was a 14 mm polyp noted.  We discussed a number of treatment options.  She is not currently sexually active, but presents now for D and C, hysteroscopy with resection of the polyp and NovaSure endometrial ablation  at the same time.  This procedure including the specific risks regarding bleeding, infection, complications such as perforation that may require additional surgery were reviewed.  Her expected recovery time discussed.  The 90% hypermenorrhea rate  post-ablation reviewed with her also.  ALLERGIES:  None.   CURRENT MEDICATIONS:  Bisoprolol 5 mg with hydrochlorothiazide 6.25, Breo Ellipta inhalation powder, cyclobenzaprine as needed, vitamin D, fluoxetine 20 mg, irbesartan 75 mg, Jardiance 10 mg tablet, levothyroxine, metformin ER, Singulair, simvastatin,  and spironolactone.  GYNECOLOGIC HISTORY:  Last mammogram 07/2018 which was normal.  SURGICAL HISTORY:  She has had 2 pregnancies, 2 miscarriages, no living children.  FAMILY HISTORY:  Significant for mother with hypertension, grandmother with osteoporosis.  Grandmother also had a thyroid disorder, as does her sister, brother with asthma.  Mother has rheumatoid arthritis and COPD.  SOCIAL HISTORY:  Occasional alcohol use.  She is single.  Denies smoking or recreational drug use.  PAST SURGICAL HISTORY:  She has had decompression of ulnar nerve and a prior  tonsillectomy.  PHYSICAL EXAMINATION: VITAL SIGNS:  Blood pressure 126/70, her weight is 312, BMI of 58.5. HEENT:  Unremarkable. NECK:  Supple, without masses. LUNGS:  Clear. CARDIOVASCULAR:  Regular rate and rhythm without murmurs, rubs or gallops. BREASTS:  Without masses. ABDOMEN:  Soft, flat, nontender.  Exam difficult due to her weight. PELVIC:  Vagina, cervix normal.  Again, pelvic exam normal, but difficult due to her weight. NEUROLOGIC:  Unremarkable.  IMPRESSION:  Endometrial polyp, abnormal bleeding.  FSH was normal at 14.  PLAN:  D and C, hysteroscopy with NovaSure endometrial ablation.  Procedure and risks reviewed as noted above.  LN/NUANCE  D:04/06/2019 T:04/06/2019 JOB:[id number]

## 2019-04-19 ENCOUNTER — Other Ambulatory Visit: Payer: Self-pay

## 2019-04-19 MED ORDER — FUROSEMIDE 40 MG PO TABS: ORAL_TABLET | ORAL | 1 refills | Status: DC

## 2019-04-20 ENCOUNTER — Other Ambulatory Visit: Payer: Self-pay | Admitting: Internal Medicine

## 2019-04-26 ENCOUNTER — Other Ambulatory Visit: Payer: Self-pay

## 2019-04-26 ENCOUNTER — Encounter (HOSPITAL_COMMUNITY): Payer: Self-pay | Admitting: *Deleted

## 2019-04-26 ENCOUNTER — Other Ambulatory Visit (HOSPITAL_COMMUNITY)
Admission: RE | Admit: 2019-04-26 | Discharge: 2019-04-26 | Disposition: A | Discharge status: Home / Self Care | Payer: Medicare Other | Source: Ambulatory Visit | Attending: Obstetrics and Gynecology | Admitting: Obstetrics and Gynecology

## 2019-04-26 ENCOUNTER — Encounter (HOSPITAL_COMMUNITY): Payer: Self-pay

## 2019-04-26 ENCOUNTER — Encounter (HOSPITAL_COMMUNITY)
Admission: RE | Admit: 2019-04-26 | Discharge: 2019-04-26 | Disposition: A | Discharge status: Home / Self Care | Payer: Medicare Other | Source: Ambulatory Visit | Attending: Obstetrics and Gynecology | Admitting: Obstetrics and Gynecology

## 2019-04-26 ENCOUNTER — Telehealth: Payer: Self-pay

## 2019-04-26 DIAGNOSIS — E118 Type 2 diabetes mellitus with unspecified complications: Secondary | ICD-10-CM | POA: Diagnosis not present

## 2019-04-26 DIAGNOSIS — Z01818 Encounter for other preprocedural examination: Secondary | ICD-10-CM | POA: Insufficient documentation

## 2019-04-26 HISTORY — DX: Anxiety disorder, unspecified: F41.9

## 2019-04-26 HISTORY — DX: Heart failure, unspecified: I50.9

## 2019-04-26 LAB — BASIC METABOLIC PANEL WITH GFR
Anion gap: 12 (ref 5–15)
BUN: 15 mg/dL (ref 6–20)
CO2: 29 mmol/L (ref 22–32)
Calcium: 9.6 mg/dL (ref 8.9–10.3)
Chloride: 96 mmol/L — ABNORMAL LOW (ref 98–111)
Creatinine, Ser: 0.66 mg/dL (ref 0.44–1.00)
GFR calc Af Amer: >60 mL/min (ref >60)
GFR calc non Af Amer: >60 mL/min (ref >60)
Glucose, Bld: 191 mg/dL — ABNORMAL HIGH (ref 70–99)
Potassium: 4.4 mmol/L (ref 3.5–5.1)
Sodium: 137 mmol/L (ref 135–145)

## 2019-04-26 LAB — CBC
HCT: 50 % — ABNORMAL HIGH (ref 36.0–46.0)
Hemoglobin: 15.5 g/dL — ABNORMAL HIGH (ref 12.0–15.0)
MCH: 29.4 pg (ref 26.0–34.0)
MCHC: 31 g/dL (ref 30.0–36.0)
MCV: 94.9 fL (ref 80.0–100.0)
Platelets: 208 K/uL (ref 150–400)
RBC: 5.27 MIL/uL — ABNORMAL HIGH (ref 3.87–5.11)
RDW: 13.6 % (ref 11.5–15.5)
WBC: 9.8 K/uL (ref 4.0–10.5)
nRBC: 0 % (ref 0.0–0.2)

## 2019-04-26 LAB — TYPE AND SCREEN
ABO/RH(D): A POS
Antibody Screen: NEGATIVE

## 2019-04-26 LAB — GLUCOSE, CAPILLARY: Glucose-Capillary: 264 mg/dL — ABNORMAL HIGH (ref 70–99)

## 2019-04-26 LAB — HEMOGLOBIN A1C
Hgb A1c MFr Bld: 7.2 % — ABNORMAL HIGH (ref 4.8–5.6)
Mean Plasma Glucose: 159.94 mg/dL

## 2019-04-26 LAB — ABO/RH: ABO/RH(D): A POS

## 2019-04-26 LAB — SARS CORONAVIRUS 2 (TAT 6-24 HRS): SARS Coronavirus 2: NEGATIVE

## 2019-04-26 MED ORDER — SPIRONOLACTONE 25 MG PO TABS: 25.0000 mg | ORAL_TABLET | Freq: Two times a day (BID) | ORAL | 2 refills | Status: DC

## 2019-04-26 MED ORDER — SPIRONOLACTONE 25 MG PO TABS: 25.0000 mg | ORAL_TABLET | Freq: Two times a day (BID) | ORAL | 0 refills | Status: DC

## 2019-04-26 NOTE — Telephone Encounter (Signed)
Both have been sent. 

## 2019-04-26 NOTE — Pre-Procedure Instructions (Addendum)
Autumn Massey  04/26/2019     Your procedure is scheduled on Thursday, October 29.   Report to [facility], Main Entrance or Entrance "A" at 5:30 AM                            Your surgery or procedure is scheduled for 7:15 AM   Call this number if you have problems the morning of surgery:[phone]    Remember:  Do not eat  after midnight.  You may drink clear liquids until 4:15 AM  Clear liquids allowed are:                     Water, Juice (non-citric and without pulp), Carbonated beverages, Clear Tea, Black Coffee only, Plain Jell-O only, Gatorade and Plain Popsicles only    Take these medicines the morning of surgery with A SIP OF WATER :  bisoprolol-hydrochlorothiazide (ZIAC             FLUoxetine (PROZAC)             fluticasone furoate-vilanterol (BREO ELLIPTA) inhaler              levothyroxine (SYNTHROID, LEVOTHROID)              pregabalin (LYRICA)   Take/Use if needed: calbuterol (PROVENTIL) yclobenzaprine (FLEXERIL) albuterol (PROVENTIL) inhaler please bring it with you.   STOP taking Aspirin, Aspirin Products (Goody Powder, Excedrin Migraine), Ibuprofen (Advil), Naproxen (Aleve), Vitamins and Herbal Products (ie Fish Oil).  WHAT DO I DO ABOUT MY DIABETES MEDICATION? Do NOT Take Jardiance the day before surgery - Wednesday October 28. Or the morning of Surgery.  Do not take Metformin the morning of surgery  How to Manage Your Diabetes Before and After Surgery  Why is it important to control my blood sugar before and after surgery? . Improving blood sugar levels before and after surgery helps healing and can limit problems. . A way of improving blood sugar control is eating a healthy diet by: o  Eating less sugar and carbohydrates o  Increasing activity/exercise o  Talking with your doctor about reaching your blood sugar goals . High blood sugars (greater than 180 mg/dL) can raise your risk of infections and slow your recovery, so you  will need to focus on controlling your diabetes during the weeks before surgery. . Make sure that the doctor who takes care of your diabetes knows about your planned surgery including the date and location.  How do I manage my blood sugar before surgery? . Check your blood sugar at least 4 times a day, starting 2 days before surgery, to make sure that the level is not too high or low. o Check your blood sugar the morning of your surgery when you wake up and every 2 hours until you get to the Short Stay unit. . If your blood sugar is less than 70 mg/dL, you will need to treat for low blood sugar: o Do not take insulin. o Treat a low blood sugar (less than 70 mg/dL) with  cup of clear juice (cranberry or apple), 4 glucose tablets, OR glucose gel. Recheck blood sugar in 15 minutes after treatment (to make sure it is greater than 70 mg/dL). If your blood sugar is not greater than 70 mg/dL on recheck, call [phone] o  for further instructions. . Report your blood sugar to the short stay nurse when you get to  Short Stay.  . If you are admitted to the hospital after surgery: o Your blood sugar will be checked by the staff and you will probably be given insulin after surgery (instead of oral diabetes medicines) to make sure you have good blood sugar levels. o The goal for blood sugar control after surgery is 80-180 mg/dL.   [place]- Preparing For Surgery  Before surgery, you can play an important role. Because skin is not sterile, your skin needs to be as free of germs as possible. You can reduce the number of germs on your skin by washing with CHG (chlorahexidine gluconate) Soap before surgery.  CHG is an antiseptic cleaner which kills germs and bonds with the skin to continue killing germs even after washing.    Oral Hygiene is also important to reduce your risk of infection.  Remember - BRUSH YOUR TEETH THE MORNING OF SURGERY WITH YOUR REGULAR TOOTHPASTE  Please do not use if you have an  allergy to CHG or antibacterial soaps. If your skin becomes reddened/irritated stop using the CHG.  Do not shave (including legs and underarms) for at least 48 hours prior to first CHG shower. It is OK to shave your face.  Please follow these instructions carefully.   1. Shower the NIGHT BEFORE SURGERY and the MORNING OF SURGERY with CHG.   2. If you chose to wash your hair, wash your hair first as usual with your normal shampoo.  3. After you shampoo,wash your face and private area with the soap you use at home, then rinse your hair and body thoroughly to remove the shampoo and soap.  4. Use CHG as you would any other liquid soap. You can apply CHG directly to the skin and wash gently with a scrungie or a clean washcloth.   5. Apply the CHG Soap to your body ONLY FROM THE NECK DOWN.  Do not use on open wounds or open sores. Avoid contact with your eyes, ears, mouth and genitals (private parts).   6. Wash thoroughly, paying special attention to the area where your surgery will be performed.  7. Thoroughly rinse your body with warm water from the neck down.  8. DO NOT shower/wash with your normal soap after using and rinsing off the CHG Soap.  9. Pat yourself dry with a CLEAN TOWEL.  10. Wear CLEAN PAJAMAS to bed the night before surgery, wear comfortable clothes the morning of surgery  11. Place CLEAN SHEETS on your bed the night of your first shower and DO NOT SLEEP WITH PETS.  Day of Surgery: Shower as instructed above. Do not wear lotions, powders, or perfumes, or deodorant. Please wear clean clothes to the hospital/surgery center.   Remember to brush your teeth WITH YOUR REGULAR TOOTHPASTE.  Do not wear jewelry, make-up or nail polish.  Do not shave 48 hours prior to surgery.   Do not bring valuables to the hospital.  [facility] is not responsible for any belongings or valuables.  Contacts, dentures or bridgework may not be worn into surgery.  Leave your suitcase in the car.   After surgery it may be brought to your room.  For patients admitted to the hospital, discharge time will be determined by your treatment team.  Patients discharged the day of surgery will not be allowed to drive home.   Please read over the following fact sheets that you were given: Pain Booklet,Coughing and Deep Breathing, Surgical Site Infections.

## 2019-04-26 NOTE — Telephone Encounter (Signed)
MEDICATION: spironolactone (ALDACTONE) 25 MG tablet  PHARMACY:1  [facility]/ 1 [facility]   IS THIS A 90 DAY SUPPLY : 1   IS PATIENT OUT OF MEDICATION:   IF NOT; HOW MUCH IS LEFT: 1 tomorrow   LAST APPOINTMENT DATE: @9 /06/2018  NEXT APPOINTMENT DATE:@3 /08/2019  DO WE HAVE YOUR PERMISSION TO LEAVE A DETAILED MESSAGE:  OTHER COMMENTS:  [facility] to hold she will be out in a day and [facility] will [facility]  Please call and advise     **Let patient know to contact pharmacy at the end of the day to make sure medication is ready. **  ** Please notify patient to allow 48-72 hours to process**  **Encourage patient to contact the pharmacy for refills or they can request refills through [facility]**

## 2019-04-26 NOTE — Progress Notes (Signed)
PCP - Dr [name]  Cardiologist - deniwes  Chest x-ray -   EKG - 04/26/2019  Stress Test -   ECHO - 2018  Cardiac Cath -  - Sleep Study - 2011 CPAP - no Oxygen 2 L at hs for "Mild sleep apena"- patient reports that she doesn't have sleep apnea.  LABS-CBC, BMP, T/S  ASA-  ERAS-yes clear liquid until 0415  HA1C-7.2 04/26/2019 Fasting Blood Sugar - 110-130  Checks Blood Sugar __2___ times a day  Anesthesia-   BMI 60. Denies chest pain or shortness EKG NSR  Pt denies having chest pain, sob, or fever at this time. All instructions explained to the pt, with a verbal understanding of the material. Pt agrees to go over the instructions while at home for a better understanding. Pt also instructed to self quarantine after being tested for COVID-19. The opportunity to ask questions was provided.

## 2019-04-27 NOTE — Progress Notes (Signed)
Anesthesia Chart Review: Follows with Dr. Melvyn Novas for obesity hypoventilation syndrome, uses 2L O2 qhs.  Preop labs reviewed, DM2 relatively well controlled, A1c 7.2.  EKG 04/26/19: NSR. Rate 69.  TTE 09/29/16: - Procedure narrative: Transthoracic echocardiography. Image   quality was suboptimal. The study was technically difficult, as a   result of poor acoustic windows and body habitus. Intravenous   contrast (Definity) was administered. - Left ventricle: The cavity size was normal. Wall thickness was   increased in a pattern of mild LVH. Systolic function was normal.   The estimated ejection fraction was in the range of 60% to 65%.   There is diastolic dysfunction with indeterminate LV filling   pressure. - Systemic veins: Not visualized.  Impressions:  - Very technically difficult study due to body habitus. Definity   contrast given, but images still difficult to interpret. LVEF   60-65%, mild LVH, diastolic dysfunction with indeterminate LV   filling pressure, no pericardial effusion.   [facility]yn[facility] Musty [facility] Short Stay Center/Anesthesiology Phone [phone] 04/27/2019 10:59 AM

## 2019-04-27 NOTE — Anesthesia Preprocedure Evaluation (Addendum)
Anesthesia Evaluation  Patient identified by MRN, date of birth, ID band Patient awake    Reviewed: Allergy & Precautions, NPO status , Patient's Chart, lab work & pertinent test results, reviewed documented beta blocker date and time   History of Anesthesia Complications Negative for: history of anesthetic complications  Airway Mallampati: III  TM Distance: >3 FB Neck ROM: Full  Mouth opening: Limited Mouth Opening  Dental no notable dental hx. (+) Partial Upper,    Pulmonary shortness of breath (2L o2 qhs) and Long-Term Oxygen Therapy, asthma , sleep apnea , COPD,  oxygen dependent, former smoker,    Pulmonary exam normal breath sounds clear to auscultation       Cardiovascular hypertension, Pt. on medications and Pt. on home beta blockers Normal cardiovascular exam Rhythm:Regular Rate:Normal  TTE 2018 - Very technically difficult study due to body habitus. Definity contrast given, but images still difficult to interpret. LVEF 60-65%, mild LVH, diastolic dysfunction with indeterminate LV filling pressure, no pericardial effusion   Neuro/Psych PSYCHIATRIC DISORDERS Anxiety Depression negative neurological ROS     GI/Hepatic negative GI ROS, Neg liver ROS,   Endo/Other  diabetes, Oral Hypoglycemic AgentsHypothyroidism Morbid obesity  Renal/GU negative Renal ROS  negative genitourinary   Musculoskeletal negative musculoskeletal ROS (+)   Abdominal   Peds  Hematology negative hematology ROS (+)   Anesthesia Other Findings   Reproductive/Obstetrics                           Anesthesia Physical Anesthesia Plan  ASA: III  Anesthesia Plan: General   Post-op Pain Management:    Induction: Intravenous  PONV Risk Score and Plan: 3 and Midazolam, Dexamethasone and Ondansetron  Airway Management Planned: LMA  Additional Equipment:   Intra-op Plan:   Post-operative Plan: Extubation in  OR  Informed Consent: I have reviewed the patients History and Physical, chart, labs and discussed the procedure including the risks, benefits and alternatives for the proposed anesthesia with the patient or authorized representative who has indicated his/her understanding and acceptance.     Dental advisory given  Plan Discussed with: CRNA  Anesthesia Plan Comments:     Anesthesia Quick Evaluation

## 2019-04-28 ENCOUNTER — Other Ambulatory Visit: Payer: Self-pay

## 2019-04-28 ENCOUNTER — Encounter (HOSPITAL_COMMUNITY)
Admission: RE | Disposition: A | Discharge status: Home / Self Care | Payer: Self-pay | Source: Home / Self Care | Attending: Obstetrics and Gynecology

## 2019-04-28 ENCOUNTER — Ambulatory Visit (HOSPITAL_COMMUNITY)
Admission: RE | Admit: 2019-04-28 | Discharge: 2019-04-28 | Disposition: A | Discharge status: Home / Self Care | Payer: Medicare Other | Attending: Obstetrics and Gynecology | Admitting: Obstetrics and Gynecology

## 2019-04-28 ENCOUNTER — Ambulatory Visit (HOSPITAL_COMMUNITY): Payer: Medicare Other | Admitting: Anesthesiology

## 2019-04-28 ENCOUNTER — Encounter (HOSPITAL_COMMUNITY): Payer: Self-pay | Admitting: General Practice

## 2019-04-28 ENCOUNTER — Ambulatory Visit (HOSPITAL_COMMUNITY): Payer: Medicare Other | Admitting: Physician Assistant

## 2019-04-28 DIAGNOSIS — Z87891 Personal history of nicotine dependence: Secondary | ICD-10-CM | POA: Insufficient documentation

## 2019-04-28 DIAGNOSIS — Z6841 Body Mass Index (BMI) 40.0 and over, adult: Secondary | ICD-10-CM | POA: Diagnosis not present

## 2019-04-28 DIAGNOSIS — Z8261 Family history of arthritis: Secondary | ICD-10-CM | POA: Insufficient documentation

## 2019-04-28 DIAGNOSIS — N840 Polyp of corpus uteri: Secondary | ICD-10-CM | POA: Insufficient documentation

## 2019-04-28 DIAGNOSIS — Z825 Family history of asthma and other chronic lower respiratory diseases: Secondary | ICD-10-CM | POA: Insufficient documentation

## 2019-04-28 DIAGNOSIS — F329 Major depressive disorder, single episode, unspecified: Secondary | ICD-10-CM | POA: Insufficient documentation

## 2019-04-28 DIAGNOSIS — E785 Hyperlipidemia, unspecified: Secondary | ICD-10-CM

## 2019-04-28 DIAGNOSIS — F419 Anxiety disorder, unspecified: Secondary | ICD-10-CM | POA: Insufficient documentation

## 2019-04-28 DIAGNOSIS — D251 Intramural leiomyoma of uterus: Secondary | ICD-10-CM | POA: Diagnosis not present

## 2019-04-28 DIAGNOSIS — Z9981 Dependence on supplemental oxygen: Secondary | ICD-10-CM | POA: Insufficient documentation

## 2019-04-28 DIAGNOSIS — Z8262 Family history of osteoporosis: Secondary | ICD-10-CM | POA: Insufficient documentation

## 2019-04-28 DIAGNOSIS — G4730 Sleep apnea, unspecified: Secondary | ICD-10-CM | POA: Diagnosis not present

## 2019-04-28 DIAGNOSIS — Z8349 Family history of other endocrine, nutritional and metabolic diseases: Secondary | ICD-10-CM | POA: Diagnosis not present

## 2019-04-28 DIAGNOSIS — Z79899 Other long term (current) drug therapy: Secondary | ICD-10-CM | POA: Insufficient documentation

## 2019-04-28 DIAGNOSIS — J449 Chronic obstructive pulmonary disease, unspecified: Secondary | ICD-10-CM | POA: Insufficient documentation

## 2019-04-28 DIAGNOSIS — E119 Type 2 diabetes mellitus without complications: Secondary | ICD-10-CM | POA: Insufficient documentation

## 2019-04-28 DIAGNOSIS — N939 Abnormal uterine and vaginal bleeding, unspecified: Secondary | ICD-10-CM | POA: Insufficient documentation

## 2019-04-28 DIAGNOSIS — E039 Hypothyroidism, unspecified: Secondary | ICD-10-CM | POA: Diagnosis not present

## 2019-04-28 DIAGNOSIS — J441 Chronic obstructive pulmonary disease with (acute) exacerbation: Secondary | ICD-10-CM | POA: Diagnosis not present

## 2019-04-28 DIAGNOSIS — I10 Essential (primary) hypertension: Secondary | ICD-10-CM | POA: Diagnosis not present

## 2019-04-28 DIAGNOSIS — Z7984 Long term (current) use of oral hypoglycemic drugs: Secondary | ICD-10-CM | POA: Insufficient documentation

## 2019-04-28 HISTORY — PX: DILITATION & CURRETTAGE/HYSTROSCOPY WITH NOVASURE ABLATION: SHX5568

## 2019-04-28 LAB — GLUCOSE, CAPILLARY
Glucose-Capillary: 141 mg/dL — ABNORMAL HIGH (ref 70–99)
Glucose-Capillary: 170 mg/dL — ABNORMAL HIGH (ref 70–99)

## 2019-04-28 LAB — POCT PREGNANCY, URINE: Preg Test, Ur: NEGATIVE

## 2019-04-28 SURGERY — DILATATION & CURETTAGE/HYSTEROSCOPY WITH NOVASURE ABLATION
Anesthesia: General | Site: Vagina

## 2019-04-28 MED ORDER — LIDOCAINE HCL 1 % IJ SOLN
INTRAMUSCULAR | Status: AC
  Filled 2019-04-28: qty 20

## 2019-04-28 MED ORDER — ONDANSETRON HCL 4 MG/2ML IJ SOLN
INTRAMUSCULAR | Status: AC
  Filled 2019-04-28: qty 2

## 2019-04-28 MED ORDER — SODIUM CHLORIDE 0.9 % IV SOLN
2.0000 g | INTRAVENOUS | Status: AC
  Administered 2019-04-28: 2 g via INTRAVENOUS
  Filled 2019-04-28: qty 2

## 2019-04-28 MED ORDER — PHENYLEPHRINE 40 MCG/ML (10ML) SYRINGE FOR IV PUSH (FOR BLOOD PRESSURE SUPPORT)
PREFILLED_SYRINGE | INTRAVENOUS | Status: AC
  Filled 2019-04-28: qty 10

## 2019-04-28 MED ORDER — OXYCODONE-ACETAMINOPHEN 5-325 MG PO TABS
1.0000 | ORAL_TABLET | Freq: Once | ORAL | Status: AC
  Administered 2019-04-28: 1 via ORAL

## 2019-04-28 MED ORDER — OXYCODONE-ACETAMINOPHEN 5-325 MG PO TABS
ORAL_TABLET | ORAL | Status: AC
  Filled 2019-04-28: qty 1

## 2019-04-28 MED ORDER — ALBUTEROL SULFATE HFA 108 (90 BASE) MCG/ACT IN AERS
INHALATION_SPRAY | RESPIRATORY_TRACT | Status: AC
  Filled 2019-04-28: qty 6.7

## 2019-04-28 MED ORDER — LIDOCAINE 2% (20 MG/ML) 5 ML SYRINGE
INTRAMUSCULAR | Status: AC
  Filled 2019-04-28: qty 5

## 2019-04-28 MED ORDER — FENTANYL CITRATE (PF) 100 MCG/2ML IJ SOLN
INTRAMUSCULAR | Status: DC | PRN
  Administered 2019-04-28 (×2): 50 ug via INTRAVENOUS

## 2019-04-28 MED ORDER — PROPOFOL 10 MG/ML IV BOLUS
INTRAVENOUS | Status: DC | PRN
  Administered 2019-04-28: 30 mg via INTRAVENOUS
  Administered 2019-04-28: 20 mg via INTRAVENOUS
  Administered 2019-04-28: 200 mg via INTRAVENOUS

## 2019-04-28 MED ORDER — MIDAZOLAM HCL 2 MG/2ML IJ SOLN
INTRAMUSCULAR | Status: AC
  Filled 2019-04-28: qty 2

## 2019-04-28 MED ORDER — FENTANYL CITRATE (PF) 100 MCG/2ML IJ SOLN: 25.0000 ug | INTRAMUSCULAR | Status: DC | PRN

## 2019-04-28 MED ORDER — KETOROLAC TROMETHAMINE 30 MG/ML IJ SOLN
INTRAMUSCULAR | Status: AC
  Filled 2019-04-28: qty 1

## 2019-04-28 MED ORDER — LACTATED RINGERS IV SOLN
INTRAVENOUS | Status: DC | PRN
  Administered 2019-04-28 (×2): via INTRAVENOUS

## 2019-04-28 MED ORDER — ALBUTEROL SULFATE HFA 108 (90 BASE) MCG/ACT IN AERS
INHALATION_SPRAY | RESPIRATORY_TRACT | Status: DC | PRN
  Administered 2019-04-28: 2 via RESPIRATORY_TRACT
  Administered 2019-04-28: 5 via RESPIRATORY_TRACT

## 2019-04-28 MED ORDER — OXYCODONE-ACETAMINOPHEN 5-325 MG PO TABS: 1.0000 | ORAL_TABLET | Freq: Four times a day (QID) | ORAL | 0 refills | Status: DC | PRN

## 2019-04-28 MED ORDER — PROPOFOL 10 MG/ML IV BOLUS
INTRAVENOUS | Status: AC
  Filled 2019-04-28: qty 20

## 2019-04-28 MED ORDER — ACETAMINOPHEN 500 MG PO TABS
1000.0000 mg | ORAL_TABLET | Freq: Once | ORAL | Status: AC
  Administered 2019-04-28: 1000 mg via ORAL
  Filled 2019-04-28: qty 2

## 2019-04-28 MED ORDER — LIDOCAINE HCL 1 % IJ SOLN
INTRAMUSCULAR | Status: DC | PRN
  Administered 2019-04-28: 9 mL

## 2019-04-28 MED ORDER — DEXAMETHASONE SODIUM PHOSPHATE 10 MG/ML IJ SOLN
INTRAMUSCULAR | Status: AC
  Filled 2019-04-28: qty 1

## 2019-04-28 MED ORDER — LIDOCAINE 2% (20 MG/ML) 5 ML SYRINGE
INTRAMUSCULAR | Status: DC | PRN
  Administered 2019-04-28: 100 mg via INTRAVENOUS

## 2019-04-28 MED ORDER — SODIUM CHLORIDE 0.9 % IR SOLN
Status: DC | PRN
  Administered 2019-04-28: 3000 mL

## 2019-04-28 MED ORDER — KETOROLAC TROMETHAMINE 30 MG/ML IJ SOLN
INTRAMUSCULAR | Status: DC | PRN
  Administered 2019-04-28 (×2): 15 mg via INTRAVENOUS

## 2019-04-28 MED ORDER — ONDANSETRON HCL 4 MG/2ML IJ SOLN
INTRAMUSCULAR | Status: DC | PRN
  Administered 2019-04-28: 4 mg via INTRAVENOUS

## 2019-04-28 MED ORDER — FENTANYL CITRATE (PF) 250 MCG/5ML IJ SOLN
INTRAMUSCULAR | Status: AC
  Filled 2019-04-28: qty 5

## 2019-04-28 MED ORDER — DEXAMETHASONE SODIUM PHOSPHATE 10 MG/ML IJ SOLN
INTRAMUSCULAR | Status: DC | PRN
  Administered 2019-04-28: 10 mg via INTRAVENOUS

## 2019-04-28 MED ORDER — MIDAZOLAM HCL 5 MG/5ML IJ SOLN
INTRAMUSCULAR | Status: DC | PRN
  Administered 2019-04-28: 2 mg via INTRAVENOUS

## 2019-04-28 MED ORDER — PHENYLEPHRINE 40 MCG/ML (10ML) SYRINGE FOR IV PUSH (FOR BLOOD PRESSURE SUPPORT)
PREFILLED_SYRINGE | INTRAVENOUS | Status: DC | PRN
  Administered 2019-04-28: 80 ug via INTRAVENOUS
  Administered 2019-04-28: 120 ug via INTRAVENOUS

## 2019-04-28 MED ORDER — IBUPROFEN 200 MG PO TABS: 600.0000 mg | ORAL_TABLET | Freq: Four times a day (QID) | ORAL | 1 refills | Status: DC | PRN

## 2019-04-28 SURGICAL SUPPLY — 10 items
ABLATOR SURESOUND NOVASURE (ABLATOR) ×2 IMPLANT
GLOVE BIO SURGEON STRL SZ7 (GLOVE) ×2 IMPLANT
GLOVE BIOGEL PI IND STRL 7.0 (GLOVE) ×1 IMPLANT
GLOVE BIOGEL PI INDICATOR 7.0 (GLOVE) ×1
GOWN STRL REUS W/ TWL LRG LVL3 (GOWN DISPOSABLE) ×3 IMPLANT
GOWN STRL REUS W/TWL LRG LVL3 (GOWN DISPOSABLE) ×6
KIT PROCEDURE FLUENT (KITS) ×2 IMPLANT
PACK VAGINAL MINOR WOMEN LF (CUSTOM PROCEDURE TRAY) ×2 IMPLANT
PAD OB MATERNITY 4.3X12.25 (PERSONAL CARE ITEMS) ×2 IMPLANT
TOWEL GREEN STERILE FF (TOWEL DISPOSABLE) ×4 IMPLANT

## 2019-04-28 NOTE — Op Note (Signed)
Preoperative diagnosis: Abnormal uterine bleeding, endometrial polyp, request endometrial ablation  Postoperative diagnosis: Same  Procedure: Hysteroscopy with D&C, NovaSure endometrial ablation  Surgeon: Matthew Saras  Anesthesia: General  EBL: 10 cc  Specimens removed: Endometrial curettings to pathology  Procedure and findings:  The patient was taken to the operating room after an adequate level of general anesthesia was obtained with the patient's legs and stirrups and with appropriate positioning, timeouts were taken.  Betadine prep and drape, the bladder was drained.  Cervix was grasped with a tenaculum, paracervical block was then created by infiltrating at 3 and 9:00 7C cc of 1% plain Xylocaine at each side after negative aspiration.  The uterus was then sounded to 7-1/2 cm with a cervical length of 3.0.  Was then progressively dilated to 27 Pratt dilator.  The continuous-flow hysteroscope was then inserted and the cavity was noted to be clean except for very small lower left fundal small polyp.  Sharp curettage was then carried out this was submitted to pathology.  Repeat measurements were taken at that point.,  The NovaSure device was then inserted with a width of 2.5 power 62 W for treatment cycle of 43 seconds.  This was done after passing the CO2 test.  She received Toradol IV afterwards for postoperative analgesia.  She tolerated this well went to recovery room in good condition.  Dictated with Dragon Medical 1  Margarette Asal MD

## 2019-04-28 NOTE — Anesthesia Procedure Notes (Signed)
Procedure Name: LMA Insertion Date/Time: 04/28/2019 7:36 AM Performed by: Jenne Campus, CRNA Pre-anesthesia Checklist: Patient identified, Emergency Drugs available, Suction available and Patient being monitored Patient Re-evaluated:Patient Re-evaluated prior to induction Oxygen Delivery Method: Circle System Utilized Preoxygenation: Pre-oxygenation with 100% oxygen Induction Type: IV induction Ventilation: Mask ventilation without difficulty LMA: LMA inserted LMA Size: 4.0 Number of attempts: 1 Airway Equipment and Method: Bite block Placement Confirmation: positive ETCO2 and breath sounds checked- equal and bilateral Tube secured with: Tape Dental Injury: Teeth and Oropharynx as per pre-operative assessment

## 2019-04-28 NOTE — Transfer of Care (Signed)
Immediate Anesthesia Transfer of Care Note  Patient: Autumn Massey  Procedure(s) Performed: DILATATION & CURETTAGE/HYSTEROSCOPY WITH NOVASURE ABLATION (N/A Vagina )  Patient Location: PACU  Anesthesia Type:General  Level of Consciousness: awake, oriented and patient cooperative  Airway & Oxygen Therapy: Patient Spontanous Breathing and Patient connected to nasal cannula oxygen  Post-op Assessment: Report given to RN and Post -op Vital signs reviewed and stable  Post vital signs: Reviewed  Last Vitals:  Vitals Value Taken Time  BP 126/55 04/28/19 0829  Temp 36.1 C 04/28/19 0829  Pulse 72 04/28/19 0835  Resp 13 04/28/19 0835  SpO2 93 % 04/28/19 0835  Vitals shown include unvalidated device data.  Last Pain:  Vitals:   04/28/19 0829  TempSrc:   PainSc: 0-No pain      Patients Stated Pain Goal: 3 ([id number])  Complications: No apparent anesthesia complications

## 2019-04-28 NOTE — Anesthesia Postprocedure Evaluation (Signed)
Anesthesia Post Note  Patient: Autumn Massey  Procedure(s) Performed: DILATATION & CURETTAGE/HYSTEROSCOPY WITH NOVASURE ABLATION (N/A Vagina )     Patient location during evaluation: PACU Anesthesia Type: General Level of consciousness: awake and alert Pain management: pain level controlled Vital Signs Assessment: post-procedure vital signs reviewed and stable Respiratory status: spontaneous breathing, nonlabored ventilation, respiratory function stable and patient connected to nasal cannula oxygen Cardiovascular status: blood pressure returned to baseline and stable Postop Assessment: no apparent nausea or vomiting Anesthetic complications: no    Last Vitals:  Vitals:   04/28/19 0945 04/28/19 0951  BP:    Pulse: 66 65  Resp: 13 15  Temp:  (!) 36.2 C  SpO2: 95% 96%    Last Pain:  Vitals:   04/28/19 0951  TempSrc:   PainSc: 0-No pain                 Gatsby Chismar L Romone Shaff

## 2019-04-28 NOTE — Progress Notes (Signed)
The patient was re-examined with no change in status 

## 2019-04-29 ENCOUNTER — Telehealth: Payer: Self-pay | Admitting: Internal Medicine

## 2019-04-29 ENCOUNTER — Encounter (HOSPITAL_COMMUNITY): Payer: Self-pay | Admitting: Obstetrics and Gynecology

## 2019-04-29 LAB — SURGICAL PATHOLOGY

## 2019-04-29 NOTE — Telephone Encounter (Signed)
Patient requesting call from [place] to discuss medication list.

## 2019-04-29 NOTE — Telephone Encounter (Signed)
Pt had questions in regard to pain meds prescribed by Dr. Matthew Saras who recently performed surgery on her. He requested that she stop taking Klonopin. I informed pt to touch base with OBGYN to see if that is something he would like for her to stop temporarily while she is on pain meds recovering or if it is something that he would like her to stop completely. Pt has been informed that if OBGYN would like for her to stop completely to give PCP a call back to see if it is another medication that could be prescribed in place on klonopin. She expressed understanding and will call back.

## 2019-05-17 ENCOUNTER — Other Ambulatory Visit: Payer: Self-pay

## 2019-05-17 DIAGNOSIS — Z20822 Contact with and (suspected) exposure to covid-19: Secondary | ICD-10-CM

## 2019-05-19 LAB — NOVEL CORONAVIRUS, NAA: SARS-CoV-2, NAA: NOT DETECTED

## 2019-06-03 ENCOUNTER — Other Ambulatory Visit: Payer: Self-pay | Admitting: Internal Medicine

## 2019-07-19 ENCOUNTER — Other Ambulatory Visit: Payer: Self-pay | Admitting: Internal Medicine

## 2019-07-25 ENCOUNTER — Ambulatory Visit: Payer: Medicare Other | Admitting: Internal Medicine

## 2019-07-26 ENCOUNTER — Other Ambulatory Visit: Payer: Self-pay

## 2019-07-26 MED ORDER — PRIMIDONE 50 MG PO TABS: 150.0000 mg | ORAL_TABLET | Freq: Every day | ORAL | 3 refills | Status: DC

## 2019-08-01 ENCOUNTER — Ambulatory Visit (INDEPENDENT_AMBULATORY_CARE_PROVIDER_SITE_OTHER): Payer: Medicare Other | Admitting: Internal Medicine

## 2019-08-01 ENCOUNTER — Encounter: Payer: Self-pay | Admitting: Internal Medicine

## 2019-08-01 ENCOUNTER — Other Ambulatory Visit: Payer: Self-pay

## 2019-08-01 DIAGNOSIS — J449 Chronic obstructive pulmonary disease, unspecified: Secondary | ICD-10-CM | POA: Diagnosis not present

## 2019-08-01 NOTE — Progress Notes (Signed)
Subjective:     Patient ID: Autumn Massey, female   DOB: 1965/07/28    MRN: [id number]     Brief patient profile:  16 yowf  With morbid obesity Quit smoking 05/2015 on admit p several months worsening sob  > [place] x 5 days > dx copd/ fluid overload and started on spiriva / pred/abx lowest wt 326 but  Started  worse again 1st of Jan 2017 with wt gain / sob s cough  So referred  07/18/2015 by Dr Jenny Reichmann to pulmonary clinic with no significiant airfow obstruction on initial eval so characterized as GOLD 0 stage / probable AB      History of Present Illness  07/18/2015 1st [facility] office visit/ Autumn Massey  maint rx spiriva  Chief Complaint  Patient presents with  . Pulmonary Consult    Referred by Dr. [name]. Pt c/o SOB since Nov 2016. She states that she was dxed with PNA at this time and then told that she had COPD. She gets SOB walking across a parking lot or doing housework.    has 02 but only uses at hs 2lpm  X years  Presently doe =  MMRC3 = can't walk 100 yards even at a slow pace at a flat grade s stopping due to sob   Onset insidious/ pattern gradually worse assoc with leg swelling and wt loss but s excess/ purulent sputum or mucus plugs Breaths better p saba ? spiriva helping  rec Stop spiriva and start stiolto 2 puffs each am and fill the prescription if you like it, if not resume spiriva handihaler  Only use your albuterol   Stop normodyne  Start bystolic 10 mg daily in place of normodyne  Wear your 02 as much as you can    09/03/2015  f/u ov/Autumn Massey re: obesity/ maint rx = stiolto 2 each am / hbp  Chief Complaint  Patient presents with  . Follow-up    Using 2L O2 at bedtime and while doing a lot of walking and also during the day while at home. Mild wheezing first thing in the morning improves after using inhaler. Denies any coughing, cp/tightness. Pt was having some bladder leakage before but has noticed improvement since starting 02.  resting ok off 02 / sleeping on 02  and leaning on cart on 02 HT / can't do WM yet but trend is improving  rec Finish up your stiolto and then start  symbicort 80 Take 2 puffs first thing in am and then another 2 puffs about 12 hours later.  Only use your albuterol(proair)  as a rescue medication to be used if you can't catch your breath by resting or doing a relaxed purse lip breathing pattern.  - The less you use it, the better it will work when you need it.    10/05/2015 acute extended ov/Autumn Massey re: sob  Chief Complaint  Patient presents with  . Acute Visit    SOB, fatigued, wheezing more than usual. Pt checked her O2 when walking today and checked her O2 levels-down to 76-had to put O2 concentrator on to help bring up to uppers 90's. Usually wears O2 QHS only. Pt lcan tell difference in breathing with Stilto-now on Symbicort since last OV instructions. Pt went back to   changed back to labetalol and symbicort about the same time and gradually worse ever since with increased need for saba due to sob assoc with subjective wheeze  s cough but comfortable at rest - no assoc increase  in chronic  leg swelling/ calf pain  rec Stop normodyne and symbicort Start bystolic 10 mg daily = bisoprolol 5 mg daily thru your plan  Start stiolto 2pffs each am  Work on maintaining perfect  inhaler technique:    10/09/2015 acute extended ov/Autumn Massey re:  02 dep resp failure / ? Now hypercarbic  Chief Complaint  Patient presents with  . Acute Visit    Breathing progressively worse since 10/05/15 ov. She has also had some wheezing, chest tightness and non prod cough for the past 2 days.   on 02 at rest fine, but across the room sob and desat and now coughing also mostly dry  rec Please remember to go to the lab department downstairs for your tests - we will call you with the results when they are available.   Please see patient coordinator before you leave today  to schedule CTa Chest   Add: last bun > 30 so changed to v/q    12/02/2016  f/u ov/Autumn Massey  re: flovent hfa 2 each am/  2nd 2 puffs on " if needed " Chief Complaint  Patient presents with  . Follow-up    Breathing is overall doing well. No new co's today. She rarely uses neb.    rare need for saba in any form now  rec Plan A = Automatic = Stop flovent and start BREO 100 one each am  Plan B = Backup Only use your albuterol as a rescue medication  Plan C = Crisis - only use your albuterol nebulizer if you first try Plan B and it fails to help > ok to use the nebulizer up to every 4 hours but if start needing it regularly call for immediate appointment   07/26/2018  f/u ov/Autumn Massey re:  AB/ breo one click each am  Chief Complaint  Patient presents with  . Follow-up    Here for refill on Breo. Her breathing is doing well. She states she sees Dr Jenny Reichmann when she has bronchitis and she has had 3 episodes of this since last seen here. She rarely uses her albuterol inhaler or neb.   Dyspnea:  More limited back pain rad to R hip Cough: only with flares of bronchitis and in between just daily sensation of pnds  Sleeping: bed flat, 2 pillows SABA use: not now 02: 2lpm at hs, no am ha rec No change breo 100   08/01/2019  f/u ov/Autumn Massey re: AB / breo 100 q d Chief Complaint  Patient presents with  . Follow-up    Breathing is unchanged since the last visit. She sometimes has a harder time breathing when it's cold out. She uses the rescue inhaler about 2 x per month.   Dyspnea:  Not limited by breathing from desired activities  But by back/ R hip Cough: rarely  Sleeping: flat bed 3 pillows   SABA use: as above  02: 2lpm    No obvious day to day or daytime variability or assoc excess/ purulent sputum or mucus plugs or hemoptysis or cp or chest tightness, subjective wheeze or overt sinus or hb symptoms.   Sleeping as above  without nocturnal  or early am exacerbation  of respiratory  c/o's or need for noct saba. Also denies any obvious fluctuation of symptoms with weather or environmental  changes or other aggravating or alleviating factors except as outlined above   No unusual exposure hx or h/o childhood pna/ asthma or knowledge of premature birth.  Current Allergies, Complete Past  Medical History, Past Surgical History, Family History, and Social History were reviewed in [employer] record.  ROS  The following are not active complaints unless bolded Hoarseness, sore throat, dysphagia, dental problems, itching, sneezing,  nasal congestion or discharge of excess mucus or purulent secretions, ear ache,   fever, chills, sweats, unintended wt loss or wt gain, classically pleuritic or exertional cp,  orthopnea pnd or arm/hand swelling  or leg swelling/sym, presyncope, palpitations, abdominal pain, anorexia, nausea, vomiting, diarrhea  or change in bowel habits or change in bladder habits, change in stools or change in urine, dysuria, hematuria,  rash, arthralgias, visual complaints, headache, numbness, weakness or ataxia or problems with walking or coordination,  change in mood or  memory.        Current Meds  Medication Sig  . Accu-Chek FastClix Lancets MISC Use to check blood sugar once a day  . acetaminophen (TYLENOL) 500 MG tablet Take 1,000 mg by mouth every 6 (six) hours as needed for mild pain, moderate pain, fever or headache.   . albuterol (PROAIR HFA) 108 (90 Base) MCG/ACT inhaler 2 puffs every 4 hours as needed only  if your can't catch your breath (Patient taking differently: Inhale 2 puffs into the lungs every 4 (four) hours as needed for shortness of breath. 2 puffs every 4 hours as needed only  if your can't catch your breath)  . albuterol (PROVENTIL) (2.5 MG/3ML) 0.083% nebulizer solution Take 3 mLs (2.5 mg total) by nebulization every 6 (six) hours as needed for wheezing or shortness of breath.  [name] aspirin EC 81 MG tablet Take 81 mg by mouth daily.  . bisoprolol-hydrochlorothiazide (ZIAC) 5-6.25 MG tablet TAKE 1 TABLET BY MOUTH  DAILY  . Blood  Glucose Monitoring Suppl (ACCU-CHEK AVIVA PLUS) w/Device KIT Use to check blood sugar once a day  . Cholecalciferol (VITAMIN D3) 125 MCG (5000 UT) CAPS Take 5,000 Units by mouth daily.  . Coenzyme Q10-Vitamin E (COQ10-VITAMIN E) 200-20 MG-UNIT CAPS Take 1 capsule by mouth daily.   . cyclobenzaprine (FLEXERIL) 5 MG tablet TAKE 1 TABLET BY MOUTH 3  TIMES DAILY AS NEEDED FOR  MUSCLE SPASM(S)  . FLUoxetine (PROZAC) 20 MG capsule TAKE 1 CAPSULE BY MOUTH  ONCE DAILY  . fluticasone furoate-vilanterol (BREO ELLIPTA) 100-25 MCG/INH AEPB INHALE 1 PUFF INTO THE LUNGS EVERY MORNING.  . furosemide (LASIX) 40 MG tablet TAKE 1 TABLET BY MOUTH  EVERY OTHER DAY AS NEEDED. (Patient taking differently: Take 40 mg by mouth See admin instructions. TAKE 1 TABLET BY MOUTH  EVERY OTHER DAY AS NEEDED.)  . GARLIC PO Take 1 tablet by mouth daily.  [name] glucose blood (ACCU-CHEK AVIVA) test strip Use to check blood sugar once a day - Aviva Plus  . ibuprofen (ADVIL) 200 MG tablet Take 3 tablets (600 mg total) by mouth every 6 (six) hours as needed.  . irbesartan (AVAPRO) 75 MG tablet TAKE 1 TABLET BY MOUTH AT  BEDTIME  . JARDIANCE 10 MG TABS tablet TAKE 1 TABLET BY MOUTH  DAILY  . Lancets Misc. (ACCU-CHEK FASTCLIX LANCET) KIT Use to check blood sugar once a day  . levothyroxine (SYNTHROID, LEVOTHROID) 200 MCG tablet TAKE 1 TABLET BY MOUTH  DAILY..  . metFORMIN (GLUCOPHAGE-XR) 500 MG 24 hr tablet Take 1 tablet (500 mg total) by mouth daily with breakfast.  . montelukast (SINGULAIR) 10 MG tablet TAKE 1 TABLET BY MOUTH AT  BEDTIME  . oxyCODONE-acetaminophen (PERCOCET) 5-325 MG tablet Take 1 tablet by mouth every  6 (six) hours as needed for severe pain.  . OXYGEN 2lpm with sleep only  [facility]  . pregabalin (LYRICA) 50 MG capsule Take 1 capsule (50 mg total) by mouth 3 (three) times daily.  . primidone (MYSOLINE) 50 MG tablet Take 3 tablets (150 mg total) by mouth at bedtime.  . simvastatin (ZOCOR) 40 MG tablet TAKE 1 TABLET BY MOUTH AT   BEDTIME DAILY (Patient taking differently: Take 40 mg by mouth daily at 6 PM. )  . spironolactone (ALDACTONE) 25 MG tablet TAKE 1 TABLET BY MOUTH TWICE A DAY  . Turmeric 500 MG CAPS Take 500 mg by mouth 2 (two) times daily.                    Objective:   Physical Exam    amb obese wf nad   Vital signs reviewed  08/01/2019  - Note at rest 02 sats  95% on RA     08/01/2019         320  07/26/2018       318  10/06/2015          353 > 10/09/2015   354 >12/02/2016   329  09/03/2015          353   07/18/15 349 lb 9.6 oz (158.578 kg)  05/31/15 332 lb (150.594 kg)  01/29/15 333 lb (151.048 kg)          HEENT : pt wearing mask not removed for exam due to covid -19 concerns.    NECK :  without JVD/Nodes/TM/ nl carotid upstrokes bilaterally   LUNGS: no acc muscle use,  Nl contour chest which is clear to A and P bilaterally without cough on insp or exp maneuvers   CV:  RRR  no s3 or murmur or increase in P2, and no edema   ABD:  Massively obese but soft and nontender with limited inspiratory excursion in the supine position. No bruits or organomegaly appreciated, bowel sounds nl  MS:  Nl gait/ ext warm without deformities, calf tenderness, cyanosis or clubbing No obvious joint restrictions   SKIN: warm and dry with venous stasis changes both LE's   NEURO:  alert, approp, nl sensorium with  no motor or cerebellar deficits apparent.                           Assessment:

## 2019-08-01 NOTE — Patient Instructions (Addendum)
Plan A = Automatic = Always=    Breo [id number] one click each am   Plan B = Backup (to supplement plan A, not to replace it) Only use your albuterol inhaler(ventiolin or albuterol) as a rescue medication to be used if you can't catch your breath by resting or doing a relaxed purse lip breathing pattern.  - The less you use it, the better it will work when you need it. - Ok to use the inhaler up to 2 puffs  every 4 hours if you must but call for appointment if use goes up over your usual need - Don't leave home without it !!  (think of it like the spare tire for your car)   Plan C = Crisis (instead of Plan B but only if Plan B stops working) - only use your albuterol nebulizer if you first try Plan B and it fails to help > ok to use the nebulizer up to every 4 hours but if start needing it regularly call for immediate appointment   Plan D = Doctor - call me if B and C not adequate    If you are satisfied with your treatment plan,  let your doctor know and he/she can either refill your medications or you can return here when your prescription runs out.     If in any way you are not 100% satisfied,  please tell us.  If 100% better, tell your friends!  Pulmonary follow up is as needed     .

## 2019-08-02 ENCOUNTER — Encounter: Payer: Self-pay | Admitting: Internal Medicine

## 2019-08-02 NOTE — Assessment & Plan Note (Signed)
Quit smoking 2016   - PFT's  05/22/10   FEV1 1.53 (62 % ) ratio 86  p no % improvement from saba with DLCO  87 % and ERV 40% at wt 209  - Quit smoking 05/2015 - 07/18/2015    try stiolto - PFT's s obst 09/03/2015 with ERV 12 p am stiolto so rec change to symbicort 80 2bid   - 10/05/2015 worse on symbicort while back on labetalol > resumed   bystolic then changed to bisoprolol  - 07/26/2018  After extensive coaching inhaler device,  effectiveness =    90%   All goals of chronic asthma control met including optimal function and elimination of symptoms with minimal need for rescue therapy.  Contingencies discussed in full including contacting this office immediately if not controlling the symptoms using the rule of two's.     F/u can be yearly.

## 2019-08-02 NOTE — Assessment & Plan Note (Signed)
ERV 12% by pfts 09/03/2015   Body mass index is 60.46 kg/m.    - still trending slt up Lab Results  Component Value Date   TSH 0.19 (L) 03/01/2019     Contributing to gerd risk/ doe/reviewed the need and the process to achieve and maintain neg calorie balance > defer f/u primary care including intermittently monitoring thyroid status            Each maintenance medication was reviewed in detail including emphasizing most importantly the difference between maintenance and prns and under what circumstances the prns are to be triggered using an action plan format where appropriate.  Total time for H and P, chart review, counseling,  and generating customized AVS unique to this office visit / charting = 20 min

## 2019-08-28 ENCOUNTER — Other Ambulatory Visit: Payer: Self-pay | Admitting: Neurology

## 2019-08-30 ENCOUNTER — Ambulatory Visit (INDEPENDENT_AMBULATORY_CARE_PROVIDER_SITE_OTHER): Payer: Medicare Other | Admitting: Internal Medicine

## 2019-08-30 DIAGNOSIS — Z5329 Procedure and treatment not carried out because of patient's decision for other reasons: Secondary | ICD-10-CM

## 2019-08-30 NOTE — Progress Notes (Signed)
Patient ID: Autumn Massey, female   DOB: Mar 21, 1966, 54 y.o.   MRN: [id number]  NO SHOW (appointment canceled within 30 minutes from the actual appointment time)  HPI  Autumn Massey is a 54 y.o.-year-old female, returning for f/u for DM2, hypothyroidism, and PCOS. Last visit 6 months ago. She is on disability now.  PCOS: Reviewed history:  - weight gain: Maximal weight 364  - was on Phentermine, which worked initially, then stopped working  - Would consider gastric bypass but only as a last resort - + hirsutism >> started to see facial hair in her 30s - + hair loss >> female pattern, and bald spot in vertex - No acne - had a Mirena IUD placed 04/2013 for heavy menstrual cycles >> out now; no menses since IUD out - has frequent Prednisone tapers    She was previously on spironolactone 50 mg twice daily but her potassium increased so we had to decrease the dose to 25 mg twice daily.  We ended up stopping spironolactone 11/2016.  However, she started to have more facial hair and more frontal balding so we restarted spironolactone 25 mg twice a day.  Her potassium level is normal now: Lab Results  Component Value Date   K 4.4 04/26/2019   Hypothyroidism: Pt. has been dx with hypothyroidism in 2005.  We decreased the dose at last visit as TSH was still suppressed.  She did not return for repeat labs...  Pt is on levothyroxine 200 mcg daily, taken: - in am - fasting - at least 30 min from b'fast - no Ca, Fe, MVI, PPIs - not on Biotin  Reviewed patient's TFTs: Lab Results  Component Value Date   TSH 0.19 (L) 03/01/2019   TSH 0.16 (L) 01/26/2019   TSH 0.65 07/26/2018   TSH 6.36 (H) 04/30/2018   TSH 0.16 (L) 03/02/2018   TSH 0.26 (L) 01/21/2018   TSH 1.29 05/05/2017   TSH 5.30 (H) 03/10/2017   TSH 8.74 (H) 12/01/2016   TSH 14.632 (H) 10/15/2016   FREET4 1.23 03/01/2019   FREET4 1.21 07/26/2018   FREET4 0.97 04/30/2018   FREET4 1.32 03/02/2018   FREET4 1.25 05/05/2017    FREET4 1.02 03/10/2017   FREET4 0.86 12/01/2016   FREET4 1.05 10/15/2016   FREET4 0.77 04/22/2016   FREET4 1.08 07/23/2015    Pt denies: - feeling nodules in neck - hoarseness - dysphagia - choking - SOB with lying down  She was on Neurontin for tremors, but had to decrease the dose because of somnolence and then she was able to stop. She is on primidone >> restarted by Dr. Tomi Likens.  She is taking Lyrica at night.  DM2:  Reviewed HbA1c: 04/26/2019: HbA1c calculated from fructosamine is 5.4% 03/09/2018: HbA1c calculated from fructosamine is better, at 5.5%! 09/02/2017: HbA1c calculated from Fructosamine is 6.1%. 05/05/2017: HbA1c calculated from Fructosamine is 6.00%. 03/10/2017: HbA1c calculated from Fructosamine is 6.17% (great!). 11/2016: HbA1c calculated from the fructosamine is radically better, at 5.77%! Lab Results  Component Value Date   HGBA1C 7.2 (H) 04/26/2019   HGBA1C 7.3 (H) 01/26/2019   HGBA1C 7.8 (A) 08/30/2018   She is on: - Metformin 1000 mg with dinner >> 500 mg with dinner >> in a.m.  - Jardiance 10 mg in the pm -  started 02/2017 >> stopped b/c UTI and also had a tear on her labia and was afraid that this could have gotten infected if she continued with Jardiance.  We restarted Jardiance 02/2018,  and she is now tolerating this well.  Not taking Januvia >> not covered. She was previously on glipizide  She checks her sugars seldom: - am: 1127-140 >> 124-134, 140 >> 131 >> n/c - lunch: n/c - after lunch: 145-150 >> 142 >> 131-152 - dinner: n/c >> 120-140, 157 >> n/c - after dinner: 140-150 >> up to 160 >> 108  She quit smoking 05/2015.  On ergocalciferol 50,000 units weekly.  At last visit, she had increased spotting.  Since then, she had a D&C on 04/28/2019.  PLAN:  1. Patient with longstanding hypothyroidism - latest thyroid labs reviewed with pt >> suppressed 6 months ago, after which I advised her to reduce the dose of levothyroxine.  She did not  return for repeat labs after the change in dose. Lab Results  Component Value Date   TSH 0.19 (L) 03/01/2019   - she continues on LT4 200 mcg daily - pt feels good on this dose. - we discussed about taking the thyroid hormone every day, with water, >30 minutes before breakfast, separated by >4 hours from acid reflux medications, calcium, iron, multivitamins. Pt. is taking it correctly. - will check thyroid tests today: TSH and fT4 - If labs are abnormal, she will need to return for repeat TFTs in 1.5 months  2. PCOS -She is now at the age of Fortino Sic is off her IUD without heavy menstrual cycles -Since she had increased facial hair and alopecia, we restarted spironolactone 25 mg twice a day.  She is also on SGLT2 inhibitor and ARB so we have to be careful with her potassium.  Latest level was from 5 months ago: Lab Results  Component Value Date   K 4.4 04/26/2019  -She did not feel a significant improvement in her hirsutism after starting spironolactone, however, we cannot increase the dose due to previous hyperkalemia  3. DM2 -Well-controlled now on p.o. medications only.  She is on the low-dose Metformin ER and also back on Jardiance before last visit.  In the past, we had to stop Jardiance due to muscle cramps and then a labial tear.  However, she is tolerating this well now. -At last visit, the HbA1c calculated from fructosamine was better, at 5.4%.  Fructosamine levels are more accurate for her. -At last visit we moved Jardiance in the morning and Metformin with breakfast, since she was telling me that she would tolerate this better if taken in the morning. - - I suggested: Patient Instructions  Please continue: - Metformin ER 500 mg with dinner - Jardiance 10 mg before breakfast  Please continue levothyroxine 200 g daily  Take the thyroid hormone every day, with water, at least 30 minutes before breakfast, separated by at least 4 hours from: - acid reflux medications -  calcium - iron - multivitamins  Please stop at the lab.  Continue spironolactone 25 mg twice a day.  Please return in 6 months  4.  Obesity class III -Continue Jardiance, which should also help with weight loss -She did a good job losing 17 pounds total before the last 2 visits -At last visit we discussed about a lower fat diet.  She was then doing a partially keto diet with intermittent fasting 16-8.  Philemon Kingdom, MD PhD [facility]

## 2019-08-30 NOTE — Patient Instructions (Signed)
Please continue: - Metformin ER 500 mg with dinner - Jardiance 10 mg before breakfast  Please continue levothyroxine 200 g daily  Take the thyroid hormone every day, with water, at least 30 minutes before breakfast, separated by at least 4 hours from: - acid reflux medications - calcium - iron - multivitamins  Please stop at the lab.  Continue spironolactone 25 mg twice a day.  Please return in 6 months

## 2019-08-31 ENCOUNTER — Encounter: Payer: Self-pay | Admitting: Internal Medicine

## 2019-09-05 ENCOUNTER — Ambulatory Visit: Payer: Medicare Other | Admitting: Family Medicine

## 2019-09-05 ENCOUNTER — Encounter: Payer: Self-pay | Admitting: Family Medicine

## 2019-09-05 ENCOUNTER — Other Ambulatory Visit: Payer: Self-pay

## 2019-09-05 ENCOUNTER — Ambulatory Visit (INDEPENDENT_AMBULATORY_CARE_PROVIDER_SITE_OTHER): Payer: Medicare Other

## 2019-09-05 VITALS — BP 110/76 | HR 82 | Ht 61.0 in | Wt 322.0 lb

## 2019-09-05 DIAGNOSIS — G8929 Other chronic pain: Secondary | ICD-10-CM

## 2019-09-05 DIAGNOSIS — M545 Low back pain: Secondary | ICD-10-CM | POA: Diagnosis not present

## 2019-09-05 DIAGNOSIS — M1711 Unilateral primary osteoarthritis, right knee: Secondary | ICD-10-CM

## 2019-09-05 DIAGNOSIS — M47816 Spondylosis without myelopathy or radiculopathy, lumbar region: Secondary | ICD-10-CM | POA: Diagnosis not present

## 2019-09-05 DIAGNOSIS — M533 Sacrococcygeal disorders, not elsewhere classified: Secondary | ICD-10-CM | POA: Diagnosis not present

## 2019-09-05 DIAGNOSIS — M4186 Other forms of scoliosis, lumbar region: Secondary | ICD-10-CM | POA: Diagnosis not present

## 2019-09-05 NOTE — Assessment & Plan Note (Signed)
Patient given injection, tolerated the procedure well, discussed icing regimen and home exercises, discussed which activity doing which will avoid.  Increase activity as tolerated.  Follow-up again 4 to 8 weeks.

## 2019-09-05 NOTE — Assessment & Plan Note (Signed)
Patient given injection, tolerated procedure well, discussed icing regimen and home exercise, follow-up again in 4 to 8 weeks

## 2019-09-05 NOTE — Patient Instructions (Addendum)
Injected SI joint and right knee [facility] PT will call you Horizant 300mg  at night for 5 days, do not take Lyrica, if you like it then we can call in Rx See me again in 5-6 weeks

## 2019-09-05 NOTE — Progress Notes (Signed)
[facility] [place] Phone: [phone] Subjective:   [name], am serving as a scribe for Dr. Hulan Saas. This visit occurred during the SARS-CoV-2 public health emergency.  Safety protocols were in place, including screening questions prior to the visit, additional usage of staff PPE, and extensive cleaning of exam room while observing appropriate contact time as indicated for disinfecting solutions.   I'm seeing this patient by the request  of:  Biagio Borg, MD  CC: Right leg and back pain  WGN:[id number]   05/03/2018 Right groin pain.  X-rays were unremarkable for any arthritic changes but avascular necrosis is still within the differential but pain is more with external range of motion and internal range of motion.  More likely musculoskeletal or lymphadenopathy in general.  Because of patient's comorbidities antibiotics given today.  This would be before any type of further imaging.  We need to consider the possibility of a CT of the hip or MR arthrogram.  Discussed with patient possible laboratory work-up as well may be necessary but has had labs recently.  Patient has some different pain medications for any breakthrough.  Discussed the possibility of a thigh compression for stability.  Encourage weight loss.  Follow-up with me again in 3 to 4 weeks.  Spent  25 minutes with patient face-to-face and had greater than 50% of counseling including as described above in assessment and plan.  Update 09/05/2019 Autumn Massey is a 54 y.o. female coming in with complaint of right hip pain and back pain. Patient continues to have pain in the right side of lower back and into the right groin. Pain has been intermittent. Patient has been using a lot of Tylenol due to pain. Pain subsides when she sits down but is stabbing when she performs hip flexion. Patient uses a walker in the house. Patient has hard time getting walker in the car  so she does not use walker outside of house.   Also complaining of right knee catching. History of meniscus tear in 2015. States she has had steroid injection that did help with her pain.  Has had a meniscal tear previously as well.  2 and half years ago had an injection and since then had no pain until recently.  Wants to be active but finds it difficult.  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       Past Medical History:  Diagnosis Date  . Anxiety   . ANXIETY DEPRESSION 01/29/2009   04/26/2019- no current  . CHF (congestive heart failure) ([place])   . DEPRESSION 11/19/2007  . Diabetes ([place])   . FREQUENCY, URINARY 10/17/2009  . HYPERLIPIDEMIA 11/19/2007  . HYPERTENSION 11/19/2007  . HYPOTHYROIDISM 11/19/2007  . Morbid obesity ([place])   . Neuropathy 01/26/2019  . Neuropathy   . PNEUMONIA 08/06/2010  . POLYCYTHEMIA 01/29/2009  . Preventative health care 04/06/2011  . Sleep apnea    mild sleep apnea, on o2 at 2l at nighttime  . SLEEP RELATED HYPOVENTILATION/HYPOXEMIA CCE 02/08/2009  . Venous insufficiency 07/19/2012   Past Surgical History:  Procedure Laterality Date  . DILITATION & CURRETTAGE/HYSTROSCOPY WITH NOVASURE ABLATION N/A 04/28/2019   Procedure: DILATATION & CURETTAGE/HYSTEROSCOPY WITH NOVASURE ABLATION;  Surgeon: Molli Posey, MD;  Location: [facility];  Service: Gynecology;  Laterality: N/A;  [employer] rep will be here confirmed on 04/20/19  . TONSILLECTOMY    . ULNAR TUNNEL RELEASE Left 02/22/2013   Procedure: LEFT ULNAR NERVE DECOMPRESSION ;  Surgeon: Tennis Must, MD;  Location: [facility];  Service: Orthopedics;  Laterality: Left;   Social History   Socioeconomic History  . Marital status: Divorced    Spouse name: Not on file  . Number of children: 0  . Years of education: 31  . Highest education level: 12th grade  Occupational History  . Occupation: Disabled    Employer: [employer]  Tobacco Use  . Smoking status: Former Smoker     Packs/day: 0.50    Years: 22.00    Pack years: 11.00    Types: Cigarettes    Quit date: 05/22/2015    Years since quitting: 4.2  . Smokeless tobacco: Never Used  Substance and Sexual Activity  . Alcohol use: Yes    Alcohol/week: 0.0 standard drinks    Comment: rare  . Drug use: No  . Sexual activity: Never    Birth control/protection: I.U.D.  Other Topics Concern  . Not on file  Social History Narrative   Patient is right-handed.   Social Determinants of Health   Financial Resource Strain:   . Difficulty of Paying Living Expenses: Not on file  Food Insecurity:   . Worried About Charity fundraiser in the Last Year: Not on file  . Ran Out of Food in the Last Year: Not on file  Transportation Needs:   . Lack of Transportation (Medical): Not on file  . Lack of Transportation (Non-Medical): Not on file  Physical Activity:   . Days of Exercise per Week: Not on file  . Minutes of Exercise per Session: Not on file  Stress:   . Feeling of Stress : Not on file  Social Connections:   . Frequency of Communication with Friends and Family: Not on file  . Frequency of Social Gatherings with Friends and Family: Not on file  . Attends Religious Services: Not on file  . Active Member of Clubs or Organizations: Not on file  . Attends Archivist Meetings: Not on file  . Marital Status: Not on file   Allergies  Allergen Reactions  . Cymbalta [Duloxetine Hcl]    Family History  Problem Relation Age of Onset  . Cancer Mother        melanoma   . Cancer Maternal Grandfather        prostate cancer  . Alcohol abuse Other   . Arthritis Other   . Hypertension Other   . High Cholesterol Sister   . Psoriasis Brother   . Osteoarthritis Maternal Grandmother 99  . Scoliosis Maternal Grandmother     Current Outpatient Medications (Endocrine & Metabolic):  [name]  JARDIANCE 10 MG TABS tablet, TAKE 1 TABLET BY MOUTH  DAILY .  levothyroxine (SYNTHROID, LEVOTHROID) 200 MCG tablet, TAKE  1 TABLET BY MOUTH  DAILY.. .  metFORMIN (GLUCOPHAGE-XR) 500 MG 24 hr tablet, Take 1 tablet (500 mg total) by mouth daily with breakfast.  Current Outpatient Medications (Cardiovascular):  .  bisoprolol-hydrochlorothiazide (ZIAC) 5-6.25 MG tablet, TAKE 1 TABLET BY MOUTH  DAILY .  furosemide (LASIX) 40 MG tablet, TAKE 1 TABLET BY MOUTH  EVERY OTHER DAY AS NEEDED. (Patient taking differently: Take 40 mg by mouth See admin instructions. TAKE 1 TABLET BY MOUTH  EVERY OTHER DAY AS NEEDED.) .  irbesartan (AVAPRO) 75 MG tablet, TAKE 1 TABLET BY MOUTH AT  BEDTIME .  simvastatin (ZOCOR) 40 MG tablet, TAKE 1 TABLET BY MOUTH AT  BEDTIME DAILY (Patient taking differently: Take 40 mg by mouth  daily at 6 PM. ) .  spironolactone (ALDACTONE) 25 MG tablet, TAKE 1 TABLET BY MOUTH TWICE A DAY  Current Outpatient Medications (Respiratory):  .  albuterol (PROAIR HFA) 108 (90 Base) MCG/ACT inhaler, 2 puffs every 4 hours as needed only  if your can't catch your breath (Patient taking differently: Inhale 2 puffs into the lungs every 4 (four) hours as needed for shortness of breath. 2 puffs every 4 hours as needed only  if your can't catch your breath) .  albuterol (PROVENTIL) (2.5 MG/3ML) 0.083% nebulizer solution, Take 3 mLs (2.5 mg total) by nebulization every 6 (six) hours as needed for wheezing or shortness of breath. .  fluticasone furoate-vilanterol (BREO ELLIPTA) 100-25 MCG/INH AEPB, INHALE 1 PUFF INTO THE LUNGS EVERY MORNING. .  montelukast (SINGULAIR) 10 MG tablet, TAKE 1 TABLET BY MOUTH AT  BEDTIME  Current Outpatient Medications (Analgesics):  .  acetaminophen (TYLENOL) 500 MG tablet, Take 1,000 mg by mouth every 6 (six) hours as needed for mild pain, moderate pain, fever or headache.  [name]  aspirin EC 81 MG tablet, Take 81 mg by mouth daily. [name]  ibuprofen (ADVIL) 200 MG tablet, Take 3 tablets (600 mg total) by mouth every 6 (six) hours as needed. [name]  oxyCODONE-acetaminophen (PERCOCET) 5-325 MG tablet, Take 1  tablet by mouth every 6 (six) hours as needed for severe pain.   Current Outpatient Medications (Other):  [name]  Accu-Chek FastClix Lancets MISC, Use to check blood sugar once a day .  Blood Glucose Monitoring Suppl (ACCU-CHEK AVIVA PLUS) w/Device KIT, Use to check blood sugar once a day .  Cholecalciferol (VITAMIN D3) 125 MCG (5000 UT) CAPS, Take 5,000 Units by mouth daily. .  Coenzyme Q10-Vitamin E (COQ10-VITAMIN E) 200-20 MG-UNIT CAPS, Take 1 capsule by mouth daily.  .  cyclobenzaprine (FLEXERIL) 5 MG tablet, TAKE 1 TABLET BY MOUTH 3  TIMES DAILY AS NEEDED FOR  MUSCLE SPASM(S) .  FLUoxetine (PROZAC) 20 MG capsule, TAKE 1 CAPSULE BY MOUTH  ONCE DAILY .  GARLIC PO, Take 1 tablet by mouth daily. [name]  glucose blood (ACCU-CHEK AVIVA) test strip, Use to check blood sugar once a day - Aviva Plus .  Lancets Misc. (ACCU-CHEK FASTCLIX LANCET) KIT, Use to check blood sugar once a day .  OXYGEN, 2lpm with sleep only  [facility] .  pregabalin (LYRICA) 50 MG capsule, TAKE 1 CAPSULE BY MOUTH 3  TIMES DAILY .  primidone (MYSOLINE) 50 MG tablet, Take 3 tablets (150 mg total) by mouth at bedtime. .  Turmeric 500 MG CAPS, Take 500 mg by mouth 2 (two) times daily.   Reviewed prior external information including notes and imaging from  primary care provider As well as notes that were available from care everywhere and other healthcare systems.  Past medical history, social, surgical and family history all reviewed in electronic medical record.  No pertanent information unless stated regarding to the chief complaint.   Review of Systems:  No headache, visual changes, nausea, vomiting, diarrhea, constipation, dizziness, abdominal pain, skin rash, fevers, chills, night sweats, weight loss, swollen lymph nodes, body aches, joint swelling, chest pain, shortness of breath, mood changes. POSITIVE muscle aches  Objective  Blood pressure 110/76, pulse 82, height _0  (1.549 m), weight (!) 322 lb (146.1 kg), SpO2 95 %.     General: No apparent distress alert and oriented x3 mood and affect normal, dressed appropriately.  Morbidly obese HEENT: Pupils equal, extraocular movements intact  Respiratory: Patient's speak in full sentences  and does not appear short of breath  Cardiovascular: 1+ lower extremity edema, non tender, no erythema  Skin: Warm dry intact with no signs of infection or rash on extremities or on axial skeleton.  Abdomen: Soft nontender  Neuro: Cranial nerves II through XII are intact, neurovascularly intact in all extremities with 2+ DTRs and 2+ pulses.  Lymph: No lymphadenopathy of posterior or anterior cervical chain or axillae bilaterally.  Gait antalgic  Knee: Right  valgus deformity noted. Large thigh to calf ratio.  Difficult to assess secondary to patient's body habitus Tender to palpation over medial and PF joint line.  ROM full in flexion and extension and lower leg rotation. instability with valgus force.  painful patellar compression. Patellar glide with moderate crepitus. Patellar and quadriceps tendons unremarkable. Hamstring and quadriceps strength is normal. Contralateral knee shows minimal arthritic changes   Low back exam does have some loss of lordosis, tender to palpation of the paraspinal musculature lumbar spine.  More tenderness over the right sacroiliac joint than the left.  Unable to do [place] secondary to tightness as well as pain over the sacroiliac joint.  Negative straight leg test.  After informed written and verbal consent, patient was seated on exam table. Right knee was prepped with alcohol swab and utilizing anterolateral approach, patient's right knee space was injected with 4:1  marcaine 0.5%: Kenalog 97m/dL. Patient tolerated the procedure well without immediate complications.  After verbal consent patient was prepped with alcohol swabs and with a 21-gauge 3 inch needle injected with 1 cc of 0.5% Marcaine and 1 cc of Kenalog 40 mg/mL no blood loss.   Postinjection instructions given.   Impression and Recommendations:     This case required medical decision making of moderate complexity. The above documentation has been reviewed and is accurate and complete ZLyndal Pulley DO       Note: This dictation was prepared with Dragon dictation along with smaller phrase technology. Any transcriptional errors that result from this process are unintentional.

## 2019-09-08 ENCOUNTER — Telehealth: Payer: Self-pay | Admitting: Internal Medicine

## 2019-09-08 MED ORDER — JARDIANCE 10 MG PO TABS: 10.0000 mg | ORAL_TABLET | Freq: Every day | ORAL | 0 refills | Status: DC

## 2019-09-08 NOTE — Telephone Encounter (Signed)
Patient requests an emergency refill RX for  Medication listed below:  MEDICATION: JARDIANCE 10 MG TABS tablet  PHARMACY:   [id number] - [place], [place] [address] Phone:  [phone]  Fax:  [phone]      IS THIS A 90 DAY SUPPLY : 30 days should last until patient's next appointment  IS PATIENT OUT OF MEDICATION: Yes  IF NOT; HOW MUCH IS LEFT: 0  LAST APPOINTMENT DATE: @1 /19/2021  NEXT APPOINTMENT DATE:@4 /02/2020  DO WE HAVE YOUR PERMISSION TO LEAVE A DETAILED MESSAGE: Yes  OTHER COMMENTS:    **Let patient know to contact pharmacy at the end of the day to make sure medication is ready. **  ** Please notify patient to allow 48-72 hours to process**  **Encourage patient to contact the pharmacy for refills or they can request refills through [facility]**

## 2019-09-08 NOTE — Telephone Encounter (Signed)
30 day supply sent

## 2019-09-13 ENCOUNTER — Telehealth: Payer: Self-pay

## 2019-09-13 ENCOUNTER — Telehealth: Payer: Self-pay | Admitting: Family Medicine

## 2019-09-13 ENCOUNTER — Other Ambulatory Visit: Payer: Self-pay

## 2019-09-13 MED ORDER — HORIZANT 300 MG PO TBCR: 300.0000 mg | EXTENDED_RELEASE_TABLET | Freq: Every day | ORAL | 3 refills | Status: DC

## 2019-09-13 NOTE — Telephone Encounter (Signed)
Pt called to let Dr.Smith know that she loved the Horizant. Took for 5 days, she felt like a new person, not as groggy. Her insurance does not cover so a 90 day supply is $1,640.00.  Also, the injection only helped for 2-3 days. She is scheduling PT but unsure what the next move was.  Pt [phone]

## 2019-09-13 NOTE — Telephone Encounter (Signed)
Left message for patient to call back. Is suppose to begin PT and I will try to call in [place] to specialty pharmacy.

## 2019-09-13 NOTE — Telephone Encounter (Signed)
Left message for patient to call back. Can try calling Rx into specialty pharmacy for patient if she would like.

## 2019-09-21 ENCOUNTER — Other Ambulatory Visit: Payer: Self-pay | Admitting: Internal Medicine

## 2019-10-02 ENCOUNTER — Other Ambulatory Visit: Payer: Self-pay | Admitting: Internal Medicine

## 2019-10-03 ENCOUNTER — Telehealth: Payer: Self-pay

## 2019-10-03 ENCOUNTER — Other Ambulatory Visit: Payer: Self-pay

## 2019-10-03 MED ORDER — PENNSAID 2 % EX SOLN: 2.0000 g | Freq: Two times a day (BID) | CUTANEOUS | 3 refills | Status: DC

## 2019-10-03 MED ORDER — PENNSAID 2 % EX SOLN: 2.0000 g | Freq: Two times a day (BID) | CUTANEOUS | 3 refills | Status: AC

## 2019-10-03 MED ORDER — FLUOXETINE HCL 20 MG PO CAPS: ORAL_CAPSULE | ORAL | 3 refills | Status: DC

## 2019-10-03 MED ORDER — IRBESARTAN 75 MG PO TABS: 75.0000 mg | ORAL_TABLET | Freq: Every day | ORAL | 1 refills | Status: DC

## 2019-10-03 NOTE — Telephone Encounter (Signed)
1.Medication Requested:  FLUoxetine (PROZAC) 20 MG capsule [facility]  - need a  90 day supply with 3 refill   irbesartan (AVAPRO) 75 MG tablet - need a 90 day supply with  3 refill   2. Pharmacy (Name, Street, [place]):[place], [place] [place]  3. On Med List: Yes   4. Last Visit with PCP: 7.29.20  5. Next visit date with PCP: 7.30.21    Agent: Please be advised that RX refills may take up to 3 business days. We ask that you follow-up with your pharmacy.

## 2019-10-03 NOTE — Addendum Note (Signed)
Addended by: Otilio Miu on: 10/03/2019 04:52 PM   Modules accepted: Orders

## 2019-10-03 NOTE — Telephone Encounter (Signed)
Requested medications sent to pharmacy.

## 2019-10-03 NOTE — Telephone Encounter (Signed)
Pt needs a phone # to contact the specialty pharmacy for the Horizant rx, she has not heard from them.

## 2019-10-03 NOTE — Telephone Encounter (Signed)
Sent in prescription. Order was never sent that is why patient never heard from pharmacy. Left message to notify.

## 2019-10-04 ENCOUNTER — Encounter: Payer: Self-pay | Admitting: Physical Therapy

## 2019-10-04 ENCOUNTER — Other Ambulatory Visit: Payer: Self-pay

## 2019-10-04 ENCOUNTER — Ambulatory Visit: Payer: Medicare Other | Attending: Family Medicine | Admitting: Physical Therapy

## 2019-10-04 DIAGNOSIS — M62830 Muscle spasm of back: Secondary | ICD-10-CM | POA: Diagnosis not present

## 2019-10-04 DIAGNOSIS — M6281 Muscle weakness (generalized): Secondary | ICD-10-CM

## 2019-10-04 DIAGNOSIS — M545 Low back pain, unspecified: Secondary | ICD-10-CM

## 2019-10-04 DIAGNOSIS — G8929 Other chronic pain: Secondary | ICD-10-CM | POA: Insufficient documentation

## 2019-10-04 NOTE — Therapy (Signed)
[facility] [address], [place], [place], [address] Phone: [phone]   Fax:  [phone]  Physical Therapy Evaluation  Patient Details  Name: Autumn Massey MRN: [id number] Date of Birth: 03/26/1966 Referring Provider (PT): Dr. Hulan Saas    Encounter Date: 10/04/2019  PT End of Session - 10/04/19 1606    Visit Number  1    Date for PT Re-Evaluation  11/29/19    Authorization Type  [employer] Medicare    PT Start Time  1450    PT Stop Time  1538    PT Time Calculation (min)  48 min    Activity Tolerance  Patient limited by pain       Past Medical History:  Diagnosis Date  . Anxiety   . ANXIETY DEPRESSION 01/29/2009   04/26/2019- no current  . CHF (congestive heart failure) ([place])   . DEPRESSION 11/19/2007  . Diabetes ([place])   . FREQUENCY, URINARY 10/17/2009  . HYPERLIPIDEMIA 11/19/2007  . HYPERTENSION 11/19/2007  . HYPOTHYROIDISM 11/19/2007  . Morbid obesity ([place])   . Neuropathy 01/26/2019  . Neuropathy   . PNEUMONIA 08/06/2010  . POLYCYTHEMIA 01/29/2009  . Preventative health care 04/06/2011  . Sleep apnea    mild sleep apnea, on o2 at 2l at nighttime  . SLEEP RELATED HYPOVENTILATION/HYPOXEMIA CCE 02/08/2009  . Venous insufficiency 07/19/2012    Past Surgical History:  Procedure Laterality Date  . DILITATION & CURRETTAGE/HYSTROSCOPY WITH NOVASURE ABLATION N/A 04/28/2019   Procedure: DILATATION & CURETTAGE/HYSTEROSCOPY WITH NOVASURE ABLATION;  Surgeon: Molli Posey, MD;  Location: [facility];  Service: Gynecology;  Laterality: N/A;  [employer] rep will be here confirmed on 04/20/19  . TONSILLECTOMY    . ULNAR TUNNEL RELEASE Left 02/22/2013   Procedure: LEFT ULNAR NERVE DECOMPRESSION ;  Surgeon: Tennis Must, MD;  Location: [facility];  Service: Orthopedics;  Laterality: Left;    There were no vitals filed for this visit.   Subjective Assessment - 10/04/19 1454    Subjective  Right groin pain last  year, stabbing pain with stepping a certain way.  Coincided with menstrual cycle.  Had surgery in October to remove uterine lining, polyp.  In March, groing and right low back pain restarted.  Back injection helped only 1 day.    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  nagging pain 30 minutes    How long can you walk comfortably?  less than 5 minutes walking; a little while longer with rolling walker    Diagnostic tests  x-rays showed early onset arthritis and scoliosis    Patient Stated Goals  relief;  weight loss    Currently in Pain?  Yes    Pain Score  5     Pain Location  Back    Pain Orientation  Right    Pain Type  Chronic pain    Aggravating Factors   walking even house to the car; rolling walker    Pain Relieving Factors  supine lying;  bend over when walk         [facility] PT Assessment - 10/04/19 0001      Assessment   Medical Diagnosis  chronic right sided low back pain     Referring Provider (  PT)  Dr. Hulan Saas     Onset Date/Surgical Date  --   1 year   Next MD Visit  as needed    Prior Therapy  after ulnar nerve surgery in 2014       Precautions   Precautions  None      Restrictions   Weight Bearing Restrictions  No      Balance Screen   Has the patient fallen in the past 6 months  No    Has the patient had a decrease in activity level because of a fear of falling?   No    Is the patient reluctant to leave their home because of a fear of falling?   No      Home Social worker  Private residence    Living Arrangements  Other relatives;Other (Comment)    Type of Kiln  One level      Prior Function   Level of Independence  Independent with basic ADLs    Vocation  On disability     Vocation Requirements  since 2016 hearing loss, asthma, left UE     Leisure  Gaffer; play with great neices; plant flowers, crafts      Observation/Other Assessments   Focus on Therapeutic Outcomes (FOTO)   54% limitation       AROM   Lumbar Flexion  40    painful   Lumbar Extension  5   pain   Lumbar - Right Side Bend  15   pain   Lumbar - Left Side Bend  20      Strength   Overall Strength Comments  Able to rise from a standard chair without UE assist     Right Hip ABduction  3+/5    Lumbar Flexion  3/5    Lumbar Extension  3+/5      Palpation   Palpation comment  tender points in right lumbar paraspinals, gluteals       Special Tests   Lumbar Tests  Slump Test      Slump test   Findings  Negative    Side  Right      Ambulation/Gait   Gait Pattern  --   slow antalgic gait    Gait Comments  no device today but uses RW at times                 Objective measurements completed on examination: See above findings.      [facility] PT Treatment/Exercise - 10/04/19 0001      Manual Therapy   Manual therapy comments  Addaday to lumbar and hip soft tissue in sidelying 3 minutes              PT Education - 10/04/19 1605    Education Details  Discussed 2 minute walks with RW;  sidelying transverse abdominus draw in; basic info on dry needling    Person(s) Educated  Patient    Methods  Explanation;Handout    Comprehension  Verbalized understanding       PT Short Term Goals - 10/04/19 1734      PT SHORT TERM GOAL #1   Title  The patient will be instructed in initial HEP and self care strategies to promote mobility and function    Time  4    Period  Weeks    Status  New  Target Date  11/01/19      PT SHORT TERM GOAL #2   Title  The patient will report a 30% improvement in right LBP with standing, walking    Time  4    Period  Weeks    Status  New      PT SHORT TERM GOAL #3   Title  The patient will be able to walk  (with RW) 6 minutes needed for grocery shopping    Time  4    Period  Weeks    Status  New      PT SHORT TERM GOAL #4   Title  Lumbar flexion ROM improved to 60 degrees, extension to 10 degrees and bil sidebending to 25 degrees needed for household chores, taking care of her grandmother    Time  4    Period  Weeks    Status  New        PT Long Term Goals - 10/04/19 1738      PT LONG TERM GOAL #1   Title  The patient will be independent in safe self progression of HEP    Time  8    Period  Weeks    Status  New    Target Date  11/29/19      PT LONG TERM GOAL #2   Title  The patient will report an overall 60% improvement in low back pain with standing and walking    Time  8    Period  Weeks    Status  New      PT LONG TERM GOAL #3   Title  The patient will be able to walk or stand (with RW as needed) for 8 minutes needed for grocery shopping    Time  8    Period  Weeks    Status  On-going      PT LONG TERM GOAL #4   Title  Right hip abduction, abdominal and trunk extensor strength improved to 4-/5 needed for walking/standing longer periods of time    Time  8    Period  Weeks    Status  New      PT LONG TERM GOAL #5   Title  FOTO functional outcome score improved from 54% limitation to 44% limitation    Time  8    Period  Weeks    Status  New             Plan - 10/04/19 1607    Clinical Impression Statement  The patient reports her problem began 1 year ago in her right groin and improved following gynecological surgery until March when she began having right low back pain,very sharp at times.  She reports her pain her worsened with walking and standing which is less than 5 minutes tolerance.  Sitting is limited to 30 minutes.  Lumbar ROM is painful and limited:  flexion 40 degrees, extension 5 degrees, right sidebend 15 degrees and left sidebend 20 degrees.  Decreased trunk strength abdominals 3/5, extensor strength 3+/5.  Right hip abduction 3+/5.  Negative Slump  test.  Numerous tender points and taut bands in gluteals and right lumbar paraspinals.  She would benefit from PT to address these deficts.    Personal Factors and Comorbidities  Comorbidity 1;Comorbidity 2;Comorbidity 3+;Fitness;Time since onset of injury/illness/exacerbation    Comorbidities  hearing impairment; obesity; HTN; COPD; CHF; neuropathy left UE; depression/anxiety    Examination-Activity Limitations  Hygiene/Grooming;Lift;Locomotion Level;Bed Mobility;Bend;Caring for Others;Sit;Carry;Squat;Stairs;Stand  Examination-Participation Restrictions  Meal Prep;Cleaning;Community Activity;Shop;Yard Work;Laundry    Stability/Clinical Decision Making  Evolving/Moderate complexity    Clinical Decision Making  Moderate    Rehab Potential  Good    PT Frequency  2x / week    PT Duration  8 weeks    PT Treatment/Interventions  ADLs/Self Care Home Management;Electrical Stimulation;Cryotherapy;Aquatic Therapy;Moist Heat;Traction;Ultrasound;Neuromuscular re-education;Therapeutic exercise;Therapeutic activities;Manual techniques;Patient/family education;Dry needling;Taping    PT Next Visit Plan  pt considering DN (recommend sidelying position for lumbar  paraspinals, gluteals and QL);  review transverse abdominus activation and try in other positions including seated;  manual therapy;  KT tape;  start HEP to include gentle stretching;  aquatic PT when available    Consulted and Agree with Plan of Care  Patient       Patient will benefit from skilled therapeutic intervention in order to improve the following deficits and impairments:  Decreased range of motion, Difficulty walking, Increased fascial restricitons, Pain, Decreased activity tolerance, Decreased strength, Increased muscle spasms, Impaired perceived functional ability  Visit Diagnosis: Chronic right-sided low back pain, unspecified whether sciatica present - Plan: PT plan of care cert/re-cert  Muscle weakness (generalized) - Plan: PT plan  of care cert/re-cert  Muscle spasm of back - Plan: PT plan of care cert/re-cert     Problem List Patient Active Problem List   Diagnosis Date Noted  . Chronic right sacroiliac joint pain 09/05/2019  . Degenerative arthritis of right knee 09/05/2019  . Neuropathy 01/26/2019  . Right hip pain 04/14/2018  . COPD exacerbation ([place]) 04/16/2017  . Metatarsal stress fracture of left foot 12/08/2016  . Stress fracture of left foot 12/08/2016  . Dysuria 11/04/2016  . Low serum cortisol level ([place]) 10/21/2016  . Leukocytosis 10/15/2016  . Hyponatremia 10/14/2016  . AKI (acute kidney injury) ([place]) 10/14/2016  . Nausea 10/09/2016  . Leg cramps 10/09/2016  . Community acquired pneumonia 09/28/2016  . Chronic diastolic congestive heart failure ([place]) 09/28/2016  . Asthma exacerbation 12/20/2015  . Type 2 diabetes mellitus with hyperglycemia, without long-term current use of insulin ([place]) 11/19/2015  . Dyspnea 10/09/2015  . Chronic respiratory failure with hypoxia and hypercapnia ([place]) 07/19/2015  . COPD GOLD 0 / AB 07/18/2015  . Vaginitis 05/31/2015  . Thoracic outlet syndrome 01/29/2015  . Ulnar neuropathy at elbow of right upper extremity 12/19/2014  . Benign essential tremor 10/13/2014  . Movement disorder 08/30/2014  . Cough 08/30/2014  . Rash and nonspecific skin eruption 03/07/2014  . Skin lesion of back 03/07/2014  . PCOS (polycystic ovarian syndrome) 10/31/2013  . History of CVA (cerebrovascular accident) 08/11/2013  . Asthmatic bronchitis 06/10/2013  . Acute meniscal tear of right knee 06/06/2013  . Sprain of MCL (medial collateral ligament) of knee 05/03/2013  . Left arm pain 02/09/2013  . Hypoxemia 07/19/2012  . Venous insufficiency 07/19/2012  . Morbid obesity due to excess calories ([place]) 07/19/2012  . Former smoker 04/11/2011  . Preventative health care 04/06/2011  . SLEEP RELATED HYPOVENTILATION/HYPOXEMIA CCE 02/08/2009  . POLYCYTHEMIA 01/29/2009  . ANXIETY  DEPRESSION 01/29/2009  . Hypothyroidism 11/19/2007  . HLD (hyperlipidemia) 11/19/2007  . DEPRESSION 11/19/2007  . Essential hypertension 11/19/2007   Ruben Im, PT 10/04/19 5:44 PM Phone: [phone] Fax: [phone] Alvera Singh 10/04/2019, 5:44 PM  [facility] [address], [place], [place], [address] Phone: [phone]   Fax:  [phone]  Name: Autumn Massey MRN: [id number] Date of Birth: 1965/09/26

## 2019-10-05 ENCOUNTER — Other Ambulatory Visit: Payer: Self-pay

## 2019-10-07 ENCOUNTER — Telehealth: Payer: Self-pay | Admitting: Internal Medicine

## 2019-10-07 ENCOUNTER — Ambulatory Visit (INDEPENDENT_AMBULATORY_CARE_PROVIDER_SITE_OTHER): Payer: Medicare Other | Admitting: Internal Medicine

## 2019-10-07 ENCOUNTER — Encounter: Payer: Self-pay | Admitting: Internal Medicine

## 2019-10-07 DIAGNOSIS — Z5329 Procedure and treatment not carried out because of patient's decision for other reasons: Secondary | ICD-10-CM

## 2019-10-07 NOTE — Patient Instructions (Signed)
Please continue: - Metformin ER 500 mg with dinner - Jardiance 10 mg before breakfast  Please continue levothyroxine 200 g daily  Take the thyroid hormone every day, with water, at least 30 minutes before breakfast, separated by at least 4 hours from: - acid reflux medications - calcium - iron - multivitamins  Please stop at the lab.  Continue spironolactone 25 mg twice a day.  Please return in 6 months

## 2019-10-07 NOTE — Progress Notes (Signed)
Patient ID: Autumn Massey, female   DOB: 10/14/1965, 54 y.o.   MRN: [id number]  Canceled same afternoon.  HPI  Autumn Massey is a 54 y.o.-year-old female, returning for f/u for DM2, hypothyroidism, and PCOS. Last visit 7 months ago. She is on disability now.  PCOS: Reviewed history:  - weight gain: Maximal weight 364  - was on Phentermine, which worked initially, then stopped working  - Would consider gastric bypass but only as a last resort - + hirsutism >> started to see facial hair in her 30s - + hair loss >> female pattern, and bald spot in vertex - No acne - had a Mirena IUD placed 04/2013 for heavy menstrual cycles >> out now; no menses since IUD out - has frequent Prednisone tapers    She was previously on spironolactone 50 mg twice daily but her potassium increased so we had to decrease the dose to 25 mg twice daily.  We ended up stopping spironolactone 11/2016.  However, she started to have more facial hair and more frontal balding so we restarted spironolactone 25 mg twice a day.  Potassium level is normal now: Lab Results  Component Value Date   K 4.4 04/26/2019   Hypothyroidism: Pt. has been dx with hypothyroidism in 2005.  We decreased the dose at last visit as TSH was still suppressed.  She did not return for repeat labs...  Pt is on levothyroxine 200 mcg daily, taken: - in am - fasting - at least 30 min from b'fast - no Ca, Fe, MVI, PPIs - not on Biotin  Reviewed her TFTs: Lab Results  Component Value Date   TSH 0.19 (L) 03/01/2019   TSH 0.16 (L) 01/26/2019   TSH 0.65 07/26/2018   TSH 6.36 (H) 04/30/2018   TSH 0.16 (L) 03/02/2018   TSH 0.26 (L) 01/21/2018   TSH 1.29 05/05/2017   TSH 5.30 (H) 03/10/2017   TSH 8.74 (H) 12/01/2016   TSH 14.632 (H) 10/15/2016   FREET4 1.23 03/01/2019   FREET4 1.21 07/26/2018   FREET4 0.97 04/30/2018   FREET4 1.32 03/02/2018   FREET4 1.25 05/05/2017   FREET4 1.02 03/10/2017   FREET4 0.86 12/01/2016   FREET4 1.05  10/15/2016   FREET4 0.77 04/22/2016   FREET4 1.08 07/23/2015    Pt denies: - feeling nodules in neck - hoarseness - dysphagia - choking - SOB with lying down  She was on Neurontin for tremors, but had to decrease the dose because of somnolence and then she was able to stop. She is on primidone >> restarted by Dr. Tomi Likens.  She is taking Lyrica at night.  DM2:  Reviewed HbA1c levels: 04/26/2019: HbA1c calculated from fructosamine is 5.4% 03/09/2018: HbA1c calculated from fructosamine is better, at 5.5%! 09/02/2017: HbA1c calculated from Fructosamine is 6.1%. 05/05/2017: HbA1c calculated from Fructosamine is 6.00%. 03/10/2017: HbA1c calculated from Fructosamine is 6.17% (great!). 11/2016: HbA1c calculated from the fructosamine is radically better, at 5.77%! Lab Results  Component Value Date   HGBA1C 7.2 (H) 04/26/2019   HGBA1C 7.3 (H) 01/26/2019   HGBA1C 7.8 (A) 08/30/2018   She is on: - Metformin 1000 mg with dinner >> 500 mg with dinner >> in a.m.  - Jardiance 10 mg in the pm -  started 02/2017 >> stopped b/c UTI and also had a tear on her labia and was afraid that this could have gotten infected if she continued with Jardiance.  We restarted Jardiance 02/2018 as she is not tolerating it well  Not  taking Januvia >> not covered. She was previously on glipizide  She is checking her sugars seldom: - am: 1127-140 >> 124-134, 140 >> 131 >> n/c - lunch: n/c - after lunch: 145-150 >> 142 >> 131-152 - dinner: n/c >> 120-140, 157 >> n/c - after dinner: 140-150 >> up to 160 >> 108  She quit smoking 05/2015.  On ergocalciferol 50,000 units weekly.  She had a D&C on 04/28/2019.  Constitutional: no weight gain/no weight loss, no fatigue, no subjective hyperthermia, no subjective hypothermia Eyes: no blurry vision, no xerophthalmia ENT: no sore throat, + see HPI Cardiovascular: no CP/no SOB/no palpitations/no leg swelling Respiratory: no cough/no SOB/no wheezing Gastrointestinal: no  N/no V/no D/no C/no acid reflux Musculoskeletal: no muscle aches/no joint aches Skin: no rashes, no hair loss Neurological: no tremors/no numbness/no tingling/no dizziness  I reviewed pt's medications, allergies, PMH, social hx, family hx, and changes were documented in the history of present illness. Otherwise, unchanged from my initial visit note.  Past Medical History:  Diagnosis Date  . Anxiety   . ANXIETY DEPRESSION 01/29/2009   04/26/2019- no current  . CHF (congestive heart failure) ([place])   . DEPRESSION 11/19/2007  . Diabetes ([place])   . FREQUENCY, URINARY 10/17/2009  . HYPERLIPIDEMIA 11/19/2007  . HYPERTENSION 11/19/2007  . HYPOTHYROIDISM 11/19/2007  . Morbid obesity ([place])   . Neuropathy 01/26/2019  . Neuropathy   . PNEUMONIA 08/06/2010  . POLYCYTHEMIA 01/29/2009  . Preventative health care 04/06/2011  . Sleep apnea    mild sleep apnea, on o2 at 2l at nighttime  . SLEEP RELATED HYPOVENTILATION/HYPOXEMIA CCE 02/08/2009  . Venous insufficiency 07/19/2012   Past Surgical History:  Procedure Laterality Date  . DILITATION & CURRETTAGE/HYSTROSCOPY WITH NOVASURE ABLATION N/A 04/28/2019   Procedure: DILATATION & CURETTAGE/HYSTEROSCOPY WITH NOVASURE ABLATION;  Surgeon: Molli Posey, MD;  Location: [place];  Service: Gynecology;  Laterality: N/A;  [employer] rep will be here confirmed on 04/20/19  . TONSILLECTOMY    . ULNAR TUNNEL RELEASE Left 02/22/2013   Procedure: LEFT ULNAR NERVE DECOMPRESSION ;  Surgeon: Tennis Must, MD;  Location: [facility];  Service: Orthopedics;  Laterality: Left;   Social History   Socioeconomic History  . Marital status: Divorced    Spouse name: Not on file  . Number of children: 0  . Years of education: 64  . Highest education level: 12th grade  Occupational History  . Occupation: Disabled    Employer: [employer]  Tobacco Use  . Smoking status: Former Smoker    Packs/day: 0.50    Years: 22.00    Pack years: 11.00    Types:  Cigarettes    Quit date: 05/22/2015    Years since quitting: 4.3  . Smokeless tobacco: Never Used  Substance and Sexual Activity  . Alcohol use: Yes    Alcohol/week: 0.0 standard drinks    Comment: rare  . Drug use: No  . Sexual activity: Never    Birth control/protection: I.U.D.  Other Topics Concern  . Not on file  Social History Narrative   Patient is right-handed.   Social Determinants of Health   Financial Resource Strain:   . Difficulty of Paying Living Expenses:   Food Insecurity:   . Worried About Charity fundraiser in the Last Year:   . Arboriculturist in the Last Year:   Transportation Needs:   . Film/video editor (Medical):   [name] Lack of Transportation (Non-Medical):   Physical Activity:   .  Days of Exercise per Week:   . Minutes of Exercise per Session:   Stress:   . Feeling of Stress :   Social Connections:   . Frequency of Communication with Friends and Family:   . Frequency of Social Gatherings with Friends and Family:   . Attends Religious Services:   . Active Member of Clubs or Organizations:   . Attends Archivist Meetings:   [name] Marital Status:   Intimate Partner Violence:   . Fear of Current or Ex-Partner:   . Emotionally Abused:   [name] Physically Abused:   . Sexually Abused:    Current Outpatient Medications on File Prior to Visit  Medication Sig Dispense Refill  . Accu-Chek FastClix Lancets MISC Use to check blood sugar once a day 100 each 3  . acetaminophen (TYLENOL) 500 MG tablet Take 1,000 mg by mouth every 6 (six) hours as needed for mild pain, moderate pain, fever or headache.     . albuterol (PROAIR HFA) 108 (90 Base) MCG/ACT inhaler 2 puffs every 4 hours as needed only  if your can't catch your breath (Patient taking differently: Inhale 2 puffs into the lungs every 4 (four) hours as needed for shortness of breath. 2 puffs every 4 hours as needed only  if your can't catch your breath) 1 Inhaler 5  . albuterol (PROVENTIL) (2.5  MG/3ML) 0.083% nebulizer solution Take 3 mLs (2.5 mg total) by nebulization every 6 (six) hours as needed for wheezing or shortness of breath. 75 mL 0  . aspirin EC 81 MG tablet Take 81 mg by mouth daily.    . bisoprolol-hydrochlorothiazide (ZIAC) 5-6.25 MG tablet TAKE 1 TABLET BY MOUTH  DAILY 90 tablet 3  . Blood Glucose Monitoring Suppl (ACCU-CHEK AVIVA PLUS) w/Device KIT Use to check blood sugar once a day 1 kit 0  . Cholecalciferol (VITAMIN D3) 125 MCG (5000 UT) CAPS Take 5,000 Units by mouth daily.    . Coenzyme Q10-Vitamin E (COQ10-VITAMIN E) 200-20 MG-UNIT CAPS Take 1 capsule by mouth daily.     . cyclobenzaprine (FLEXERIL) 5 MG tablet TAKE 1 TABLET BY MOUTH 3  TIMES DAILY AS NEEDED FOR  MUSCLE SPASM(S) 270 tablet 1  . Diclofenac Sodium (PENNSAID) 2 % SOLN Place 2 g onto the skin 2 (two) times daily. 112 g 3  . FLUoxetine (PROZAC) 20 MG capsule TAKE 1 CAPSULE BY MOUTH EVERY DAY Annual appt due in July must see provider for future refills 30 capsule 3  . fluticasone furoate-vilanterol (BREO ELLIPTA) 100-25 MCG/INH AEPB INHALE 1 PUFF INTO THE LUNGS EVERY MORNING. 90 each 0  . furosemide (LASIX) 40 MG tablet TAKE 1 TABLET BY MOUTH  EVERY OTHER DAY AS NEEDED. (Patient taking differently: Take 40 mg by mouth See admin instructions. TAKE 1 TABLET BY MOUTH  EVERY OTHER DAY AS NEEDED.) 45 tablet 1  . Gabapentin Enacarbil ER (HORIZANT) 300 MG TBCR Take 300 mg by mouth at bedtime. 90 tablet 3  . GARLIC PO Take 1 tablet by mouth daily.    [name] glucose blood (ACCU-CHEK AVIVA) test strip Use to check blood sugar once a day - Aviva Plus 100 each 3  . ibuprofen (ADVIL) 200 MG tablet Take 3 tablets (600 mg total) by mouth every 6 (six) hours as needed. 30 tablet 1  . irbesartan (AVAPRO) 75 MG tablet Take 1 tablet (75 mg total) by mouth at bedtime. 90 tablet 1  . JARDIANCE 10 MG TABS tablet TAKE 1 TABLET BY MOUTH EVERY DAY  30 tablet 0  . Lancets Misc. (ACCU-CHEK FASTCLIX LANCET) KIT Use to check blood sugar once  a day 1 kit 0  . levothyroxine (SYNTHROID, LEVOTHROID) 200 MCG tablet TAKE 1 TABLET BY MOUTH  DAILY.. 60 tablet 5  . metFORMIN (GLUCOPHAGE-XR) 500 MG 24 hr tablet Take 1 tablet (500 mg total) by mouth daily with breakfast. 90 tablet 3  . montelukast (SINGULAIR) 10 MG tablet TAKE 1 TABLET BY MOUTH AT  BEDTIME 90 tablet 3  . oxyCODONE-acetaminophen (PERCOCET) 5-325 MG tablet Take 1 tablet by mouth every 6 (six) hours as needed for severe pain. 20 tablet 0  . OXYGEN 2lpm with sleep only  [facility]    . pregabalin (LYRICA) 50 MG capsule TAKE 1 CAPSULE BY MOUTH 3  TIMES DAILY 270 capsule 1  . primidone (MYSOLINE) 50 MG tablet Take 3 tablets (150 mg total) by mouth at bedtime. 90 tablet 3  . simvastatin (ZOCOR) 40 MG tablet TAKE 1 TABLET BY MOUTH AT  BEDTIME DAILY (Patient taking differently: Take 40 mg by mouth daily at 6 PM. ) 90 tablet 3  . spironolactone (ALDACTONE) 25 MG tablet TAKE 1 TABLET BY MOUTH TWICE A DAY 180 tablet 0  . Turmeric 500 MG CAPS Take 500 mg by mouth 2 (two) times daily.     No current facility-administered medications on file prior to visit.   Allergies  Allergen Reactions  . Cymbalta [Duloxetine Hcl]    Family History  Problem Relation Age of Onset  . Cancer Mother        melanoma   . Cancer Maternal Grandfather        prostate cancer  . Alcohol abuse Other   . Arthritis Other   . Hypertension Other   . High Cholesterol Sister   . Psoriasis Brother   . Osteoarthritis Maternal Grandmother 99  . Scoliosis Maternal Grandmother    PE: Constitutional: overweight, in NAD Eyes: PERRLA, EOMI, no exophthalmos ENT: moist mucous membranes, no thyromegaly, no cervical lymphadenopathy Cardiovascular: RRR, No MRG Respiratory: CTA B Gastrointestinal: abdomen soft, NT, ND, BS+ Musculoskeletal: no deformities, strength intact in all 4 Skin: moist, warm, no rashes Neurological: no tremor with outstretched hands, DTR normal in all 4  PLAN:  1. Patient with longstanding  hypothyroidism - latest thyroid labs reviewed with pt >> suppressed 7 months ago, after which I advised her to reduce the dose of levothyroxine.  However, she did not return for repeat labs after the change in dose Lab Results  Component Value Date   TSH 0.19 (L) 03/01/2019   - she continues on LT4 200 mcg daily - pt feels good on this dose. - we discussed about taking the thyroid hormone every day, with water, >30 minutes before breakfast, separated by >4 hours from acid reflux medications, calcium, iron, multivitamins. Pt. is taking it correctly. - will check thyroid tests today: TSH and fT4 - If labs are abnormal, she will need to return for repeat TFTs in 1.5 months  2. PCOS -She is now at the age of Fort[age] Sic is off her IUD without heavy menstrual cycles -Since she had increased facial hair and alopecia, we restarted spironolactone 25 mg twice a day.  She is also on SGLT2 inhibitor and ARB so we have to be careful with her potassium.  Latest level was normal 6 months ago: Lab Results  Component Value Date   K 4.4 04/26/2019  -She did not feel a significant improvement in her hirsutism after starting spironolactone but this  is a low dose, however, we cannot increase the dose further due to previous hyperkalemia  3. DM2 -Well-controlled now on p.o. medications only.  She is on a low dose Metformin ER and also back on Jardiance before last visit.  In the past, we had to stop Jardiance due to muscle cramps and then a labial tear.  However, she is tolerating this well now. -At last visit, the HbA1c calculated from fructosamine was better, at 5.4%.  Fructosamine levels are more accurate for her.  At that time we will Jardiance in the morning and Metformin with breakfast since she was told me that she would tolerate this better if taken in the morning. - - I suggested: Patient Instructions  Please continue: - Metformin ER 500 mg with breakfast - Jardiance 10 mg before breakfast  Please  continue levothyroxine 200 g daily  Take the thyroid hormone every day, with water, at least 30 minutes before breakfast, separated by at least 4 hours from: - acid reflux medications - calcium - iron - multivitamins  Please stop at the lab.  Continue spironolactone 25 mg twice a day.  Please return in 6 months.  4.  Obesity class III -Continue Jardiance which should also help with weight loss -She had a good job losing 17 pounds total before our last 2 visits -At last visit we discussed about a lower fat diet.  She was then doing a partially keto diet with intermittent fasting 16-8.  Philemon Kingdom, MD PhD [facility] Endocrinology

## 2019-10-10 NOTE — Telephone Encounter (Signed)
NA

## 2019-10-11 ENCOUNTER — Ambulatory Visit: Payer: Medicare Other | Admitting: Family Medicine

## 2019-10-11 DIAGNOSIS — Z0289 Encounter for other administrative examinations: Secondary | ICD-10-CM

## 2019-10-11 NOTE — Progress Notes (Deleted)
[facility] [place] Phone: [phone] Subjective:    I'm seeing this patient by the request  of:  Biagio Borg, MD  CC: back pain and knee pain follow up   [id number]   09/05/2019 Patient given injection, tolerated procedure well, discussed icing regimen and home exercise, follow-up again in 4 to 8 weeks  Patient given injection, tolerated the procedure well, discussed icing regimen and home exercises, discussed which activity doing which will avoid.  Increase activity as tolerated.  Follow-up again 4 to 8 weeks.  Update 10/11/2019 Autumn Massey is a 54 y.o. female coming in with complaint of right knee and SI joint pain. Patient states      Past Medical History:  Diagnosis Date  . Anxiety   . ANXIETY DEPRESSION 01/29/2009   04/26/2019- no current  . CHF (congestive heart failure) ([place])   . DEPRESSION 11/19/2007  . Diabetes ([place])   . FREQUENCY, URINARY 10/17/2009  . HYPERLIPIDEMIA 11/19/2007  . HYPERTENSION 11/19/2007  . HYPOTHYROIDISM 11/19/2007  . Morbid obesity ([place])   . Neuropathy 01/26/2019  . Neuropathy   . PNEUMONIA 08/06/2010  . POLYCYTHEMIA 01/29/2009  . Preventative health care 04/06/2011  . Sleep apnea    mild sleep apnea, on o2 at 2l at nighttime  . SLEEP RELATED HYPOVENTILATION/HYPOXEMIA CCE 02/08/2009  . Venous insufficiency 07/19/2012   Past Surgical History:  Procedure Laterality Date  . DILITATION & CURRETTAGE/HYSTROSCOPY WITH NOVASURE ABLATION N/A 04/28/2019   Procedure: DILATATION & CURETTAGE/HYSTEROSCOPY WITH NOVASURE ABLATION;  Surgeon: Molli Posey, MD;  Location: [facility];  Service: Gynecology;  Laterality: N/A;  [employer] rep will be here confirmed on 04/20/19  . TONSILLECTOMY    . ULNAR TUNNEL RELEASE Left 02/22/2013   Procedure: LEFT ULNAR NERVE DECOMPRESSION ;  Surgeon: Tennis Must, MD;  Location: [facility];  Service: Orthopedics;  Laterality: Left;   Social History    Socioeconomic History  . Marital status: Divorced    Spouse name: Not on file  . Number of children: 0  . Years of education: 47  . Highest education level: 12th grade  Occupational History  . Occupation: Disabled    Employer: [employer]  Tobacco Use  . Smoking status: Former Smoker    Packs/day: 0.50    Years: 22.00    Pack years: 11.00    Types: Cigarettes    Quit date: 05/22/2015    Years since quitting: 4.3  . Smokeless tobacco: Never Used  Substance and Sexual Activity  . Alcohol use: Yes    Alcohol/week: 0.0 standard drinks    Comment: rare  . Drug use: No  . Sexual activity: Never    Birth control/protection: I.U.D.  Other Topics Concern  . Not on file  Social History Narrative   Patient is right-handed.   Social Determinants of Health   Financial Resource Strain:   . Difficulty of Paying Living Expenses:   Food Insecurity:   . Worried About Charity fundraiser in the Last Year:   . Arboriculturist in the Last Year:   Transportation Needs:   . Film/video editor (Medical):   [name] Lack of Transportation (Non-Medical):   Physical Activity:   . Days of Exercise per Week:   . Minutes of Exercise per Session:   Stress:   . Feeling of Stress :   Social Connections:   . Frequency of Communication with Friends and Family:   .  Frequency of Social Gatherings with Friends and Family:   . Attends Religious Services:   . Active Member of Clubs or Organizations:   . Attends Archivist Meetings:   [name] Marital Status:    Allergies  Allergen Reactions  . Cymbalta [Duloxetine Hcl]    Family History  Problem Relation Age of Onset  . Cancer Mother        melanoma   . Cancer Maternal Grandfather        prostate cancer  . Alcohol abuse Other   . Arthritis Other   . Hypertension Other   . High Cholesterol Sister   . Psoriasis Brother   . Osteoarthritis Maternal Grandmother 99  . Scoliosis Maternal Grandmother     Current Outpatient  Medications (Endocrine & Metabolic):  [name]  JARDIANCE 10 MG TABS tablet, TAKE 1 TABLET BY MOUTH EVERY DAY .  levothyroxine (SYNTHROID, LEVOTHROID) 200 MCG tablet, TAKE 1 TABLET BY MOUTH  DAILY.. .  metFORMIN (GLUCOPHAGE-XR) 500 MG 24 hr tablet, Take 1 tablet (500 mg total) by mouth daily with breakfast.  Current Outpatient Medications (Cardiovascular):  .  bisoprolol-hydrochlorothiazide (ZIAC) 5-6.25 MG tablet, TAKE 1 TABLET BY MOUTH  DAILY .  furosemide (LASIX) 40 MG tablet, TAKE 1 TABLET BY MOUTH  EVERY OTHER DAY AS NEEDED. (Patient taking differently: Take 40 mg by mouth See admin instructions. TAKE 1 TABLET BY MOUTH  EVERY OTHER DAY AS NEEDED.) .  irbesartan (AVAPRO) 75 MG tablet, Take 1 tablet (75 mg total) by mouth at bedtime. .  simvastatin (ZOCOR) 40 MG tablet, TAKE 1 TABLET BY MOUTH AT  BEDTIME DAILY (Patient taking differently: Take 40 mg by mouth daily at 6 PM. ) .  spironolactone (ALDACTONE) 25 MG tablet, TAKE 1 TABLET BY MOUTH TWICE A DAY  Current Outpatient Medications (Respiratory):  .  albuterol (PROAIR HFA) 108 (90 Base) MCG/ACT inhaler, 2 puffs every 4 hours as needed only  if your can't catch your breath (Patient taking differently: Inhale 2 puffs into the lungs every 4 (four) hours as needed for shortness of breath. 2 puffs every 4 hours as needed only  if your can't catch your breath) .  albuterol (PROVENTIL) (2.5 MG/3ML) 0.083% nebulizer solution, Take 3 mLs (2.5 mg total) by nebulization every 6 (six) hours as needed for wheezing or shortness of breath. .  fluticasone furoate-vilanterol (BREO ELLIPTA) 100-25 MCG/INH AEPB, INHALE 1 PUFF INTO THE LUNGS EVERY MORNING. .  montelukast (SINGULAIR) 10 MG tablet, TAKE 1 TABLET BY MOUTH AT  BEDTIME  Current Outpatient Medications (Analgesics):  .  acetaminophen (TYLENOL) 500 MG tablet, Take 1,000 mg by mouth every 6 (six) hours as needed for mild pain, moderate pain, fever or headache.  [name]  aspirin EC 81 MG tablet, Take 81 mg by mouth  daily. [name]  ibuprofen (ADVIL) 200 MG tablet, Take 3 tablets (600 mg total) by mouth every 6 (six) hours as needed. [name]  oxyCODONE-acetaminophen (PERCOCET) 5-325 MG tablet, Take 1 tablet by mouth every 6 (six) hours as needed for severe pain.   Current Outpatient Medications (Other):  [name]  Accu-Chek FastClix Lancets MISC, Use to check blood sugar once a day .  Blood Glucose Monitoring Suppl (ACCU-CHEK AVIVA PLUS) w/Device KIT, Use to check blood sugar once a day .  Cholecalciferol (VITAMIN D3) 125 MCG (5000 UT) CAPS, Take 5,000 Units by mouth daily. .  Coenzyme Q10-Vitamin E (COQ10-VITAMIN E) 200-20 MG-UNIT CAPS, Take 1 capsule by mouth daily.  .  cyclobenzaprine (FLEXERIL) 5 MG  tablet, TAKE 1 TABLET BY MOUTH 3  TIMES DAILY AS NEEDED FOR  MUSCLE SPASM(S) .  Diclofenac Sodium (PENNSAID) 2 % SOLN, Place 2 g onto the skin 2 (two) times daily. [name]  FLUoxetine (PROZAC) 20 MG capsule, TAKE 1 CAPSULE BY MOUTH EVERY DAY Annual appt due in July must see provider for future refills .  Gabapentin Enacarbil ER (HORIZANT) 300 MG TBCR, Take 300 mg by mouth at bedtime. [name]  GARLIC PO, Take 1 tablet by mouth daily. [name]  glucose blood (ACCU-CHEK AVIVA) test strip, Use to check blood sugar once a day - Aviva Plus .  Lancets Misc. (ACCU-CHEK FASTCLIX LANCET) KIT, Use to check blood sugar once a day .  OXYGEN, 2lpm with sleep only  [facility] .  pregabalin (LYRICA) 50 MG capsule, TAKE 1 CAPSULE BY MOUTH 3  TIMES DAILY .  primidone (MYSOLINE) 50 MG tablet, Take 3 tablets (150 mg total) by mouth at bedtime. .  Turmeric 500 MG CAPS, Take 500 mg by mouth 2 (two) times daily.   Reviewed prior external information including notes and imaging from  primary care provider As well as notes that were available from care everywhere and other healthcare systems.  Past medical history, social, surgical and family history all reviewed in electronic medical record.  No pertanent information unless stated regarding to the chief complaint.    Review of Systems:  No headache, visual changes, nausea, vomiting, diarrhea, constipation, dizziness, abdominal pain, skin rash, fevers, chills, night sweats, weight loss, swollen lymph nodes, body aches, joint swelling, chest pain, shortness of breath, mood changes. POSITIVE muscle aches  Objective  There were no vitals taken for this visit.   General: No apparent distress alert and oriented x3 mood and affect normal, dressed appropriately.  HEENT: Pupils equal, extraocular movements intact  Respiratory: Patient's speak in full sentences and does not appear short of breath  Cardiovascular: No lower extremity edema, non tender, no erythema  Neuro: Cranial nerves II through XII are intact, neurovascularly intact in all extremities with 2+ DTRs and 2+ pulses.  Gait normal with good balance and coordination.  MSK:  Non tender with full range of motion and good stability and symmetric strength and tone of shoulders, elbows, wrist, hip, knee and ankles bilaterally.     Impression and Recommendations:     This case required medical decision making of moderate complexity. The above documentation has been reviewed and is accurate and complete Lyndal Pulley, DO       Note: This dictation was prepared with Dragon dictation along with smaller phrase technology. Any transcriptional errors that result from this process are unintentional.

## 2019-10-16 ENCOUNTER — Encounter: Payer: Self-pay | Admitting: Family Medicine

## 2019-10-16 ENCOUNTER — Encounter: Payer: Self-pay | Admitting: Internal Medicine

## 2019-10-17 MED ORDER — FLUOXETINE HCL 20 MG PO CAPS: ORAL_CAPSULE | ORAL | 0 refills | Status: DC

## 2019-10-17 MED ORDER — FUROSEMIDE 40 MG PO TABS: ORAL_TABLET | ORAL | 0 refills | Status: DC

## 2019-10-17 MED ORDER — SIMVASTATIN 40 MG PO TABS: ORAL_TABLET | ORAL | 0 refills | Status: DC

## 2019-10-17 MED ORDER — MONTELUKAST SODIUM 10 MG PO TABS: 10.0000 mg | ORAL_TABLET | Freq: Every day | ORAL | 0 refills | Status: DC

## 2019-10-17 MED ORDER — BISOPROLOL-HYDROCHLOROTHIAZIDE 5-6.25 MG PO TABS: 1.0000 | ORAL_TABLET | Freq: Every day | ORAL | 0 refills | Status: DC

## 2019-10-17 MED ORDER — IRBESARTAN 75 MG PO TABS: 75.0000 mg | ORAL_TABLET | Freq: Every day | ORAL | 0 refills | Status: DC

## 2019-10-19 NOTE — Progress Notes (Signed)
NEUROLOGY FOLLOW UP OFFICE NOTE  Autumn Massey [id number]  HISTORY OF PRESENT ILLNESS: Autumn Massey isa 54 year old right-handed woman with hypothyroidism, hyperlipidemia, impaired glucose tolerance, hypertension, hypoventilation, cerebrovascular disease and former smoker who follows up for essential tremor.  UPDATE: She is taking primidone 150 mg at bedtime. She has been seeing Dr. Tamala Julian for right leg pain due to degenerative knee and hip pain.  Lyrica will soon be switched to Horizant by Dr. Tamala Julian for better control.  Tremors are more prominent in morning, making it difficult to hold her medications.  They improve later in the day.  She has noticed increased numbness in the 5th digit and across the fingers of her left hand.  She notes shooting pain if she hits her elbow. She leans on her elbows often typing at the desk.   HISTORY: She underwent left ulnar decompression surgery in August 2014. When the cast was removed the following month, she noted twitching of her fifth digit. An MRI of the cervical spine was performed in December 2014, which revealed no structural etiology forradiculopathy. She had a NCV-EMG inJanuary 2015 which was essentially normal. She continued to have numbness, paresthesias and weakness of the fourth and fifth digits of the left hand. She later developed tremor in both hands, more noticeable in the right hand since she is right-handed dominant. It occurs when she is writing or holding utensils. She denies gait difficulty, rigidity, lack of sense of smell, or history consistent with REM sleep behavior. She is concerned about Parkinson's disease because her maternal grandfather and uncle had it. Another uncle had tremor.  She has numbness and tingling in the toes and fingers. She also notes electric pain in the right leg below the knee to the foot. For neuropathic pain, she has tried gabapentin, Cymbalta, and Lyrica.   PAST MEDICAL  HISTORY: Past Medical History:  Diagnosis Date  . Anxiety   . ANXIETY DEPRESSION 01/29/2009   04/26/2019- no current  . CHF (congestive heart failure) ([place])   . DEPRESSION 11/19/2007  . Diabetes ([place])   . FREQUENCY, URINARY 10/17/2009  . HYPERLIPIDEMIA 11/19/2007  . HYPERTENSION 11/19/2007  . HYPOTHYROIDISM 11/19/2007  . Morbid obesity ([place])   . Neuropathy 01/26/2019  . Neuropathy   . PNEUMONIA 08/06/2010  . POLYCYTHEMIA 01/29/2009  . Preventative health care 04/06/2011  . Sleep apnea    mild sleep apnea, on o2 at 2l at nighttime  . SLEEP RELATED HYPOVENTILATION/HYPOXEMIA CCE 02/08/2009  . Venous insufficiency 07/19/2012    MEDICATIONS: Current Outpatient Medications on File Prior to Visit  Medication Sig Dispense Refill  . Accu-Chek FastClix Lancets MISC Use to check blood sugar once a day 100 each 3  . acetaminophen (TYLENOL) 500 MG tablet Take 1,000 mg by mouth every 6 (six) hours as needed for mild pain, moderate pain, fever or headache.     . albuterol (PROAIR HFA) 108 (90 Base) MCG/ACT inhaler 2 puffs every 4 hours as needed only  if your can't catch your breath (Patient taking differently: Inhale 2 puffs into the lungs every 4 (four) hours as needed for shortness of breath. 2 puffs every 4 hours as needed only  if your can't catch your breath) 1 Inhaler 5  . albuterol (PROVENTIL) (2.5 MG/3ML) 0.083% nebulizer solution Take 3 mLs (2.5 mg total) by nebulization every 6 (six) hours as needed for wheezing or shortness of breath. 75 mL 0  . aspirin EC 81 MG tablet Take 81 mg by mouth daily.    [name]  bisoprolol-hydrochlorothiazide (ZIAC) 5-6.25 MG tablet Take 1 tablet by mouth daily. Annual appt due in July must see provider for future refills 90 tablet 0  . Blood Glucose Monitoring Suppl (ACCU-CHEK AVIVA PLUS) w/Device KIT Use to check blood sugar once a day 1 kit 0  . Cholecalciferol (VITAMIN D3) 125 MCG (5000 UT) CAPS Take 5,000 Units by mouth daily.    . Coenzyme Q10-Vitamin E (COQ10-VITAMIN E)  200-20 MG-UNIT CAPS Take 1 capsule by mouth daily.     . cyclobenzaprine (FLEXERIL) 5 MG tablet TAKE 1 TABLET BY MOUTH 3  TIMES DAILY AS NEEDED FOR  MUSCLE SPASM(S) 270 tablet 1  . Diclofenac Sodium (PENNSAID) 2 % SOLN Place 2 g onto the skin 2 (two) times daily. 112 g 3  . FLUoxetine (PROZAC) 20 MG capsule TAKE 1 CAPSULE BY MOUTH EVERY DAY Annual appt due in July must see provider for future refills 90 capsule 0  . fluticasone furoate-vilanterol (BREO ELLIPTA) 100-25 MCG/INH AEPB INHALE 1 PUFF INTO THE LUNGS EVERY MORNING. 90 each 0  . furosemide (LASIX) 40 MG tablet TAKE 1 TABLET BY MOUTH  EVERY OTHER DAY AS NEEDED. Annual appt due in July must see provider for future refills 45 tablet 0  . Gabapentin Enacarbil ER (HORIZANT) 300 MG TBCR Take 300 mg by mouth at bedtime. 90 tablet 3  . GARLIC PO Take 1 tablet by mouth daily.    [name] glucose blood (ACCU-CHEK AVIVA) test strip Use to check blood sugar once a day - Aviva Plus 100 each 3  . ibuprofen (ADVIL) 200 MG tablet Take 3 tablets (600 mg total) by mouth every 6 (six) hours as needed. 30 tablet 1  . irbesartan (AVAPRO) 75 MG tablet Take 1 tablet (75 mg total) by mouth at bedtime. Annual appt due in July must see provider for future refills 90 tablet 0  . JARDIANCE 10 MG TABS tablet TAKE 1 TABLET BY MOUTH EVERY DAY 30 tablet 0  . Lancets Misc. (ACCU-CHEK FASTCLIX LANCET) KIT Use to check blood sugar once a day 1 kit 0  . levothyroxine (SYNTHROID, LEVOTHROID) 200 MCG tablet TAKE 1 TABLET BY MOUTH  DAILY.. 60 tablet 5  . metFORMIN (GLUCOPHAGE-XR) 500 MG 24 hr tablet Take 1 tablet (500 mg total) by mouth daily with breakfast. 90 tablet 3  . montelukast (SINGULAIR) 10 MG tablet Take 1 tablet (10 mg total) by mouth at bedtime. Annual appt due in July must see provider for future refills 90 tablet 0  . oxyCODONE-acetaminophen (PERCOCET) 5-325 MG tablet Take 1 tablet by mouth every 6 (six) hours as needed for severe pain. 20 tablet 0  . OXYGEN 2lpm with  sleep only  [facility]    . pregabalin (LYRICA) 50 MG capsule TAKE 1 CAPSULE BY MOUTH 3  TIMES DAILY 270 capsule 1  . primidone (MYSOLINE) 50 MG tablet Take 3 tablets (150 mg total) by mouth at bedtime. 90 tablet 3  . simvastatin (ZOCOR) 40 MG tablet TAKE 1 TABLET BY MOUTH AT 6 PM Annual appt due in July must see provider for future refills 90 tablet 0  . spironolactone (ALDACTONE) 25 MG tablet TAKE 1 TABLET BY MOUTH TWICE A DAY 180 tablet 0  . Turmeric 500 MG CAPS Take 500 mg by mouth 2 (two) times daily.     No current facility-administered medications on file prior to visit.    ALLERGIES: Allergies  Allergen Reactions  . Cymbalta [Duloxetine Hcl]     FAMILY HISTORY: Family History  Problem  Relation Age of Onset  . Cancer Mother        melanoma   . Cancer Maternal Grandfather        prostate cancer  . Alcohol abuse Other   . Arthritis Other   . Hypertension Other   . High Cholesterol Sister   . Psoriasis Brother   . Osteoarthritis Maternal Grandmother 99  . Scoliosis Maternal Grandmother     SOCIAL HISTORY: Social History   Socioeconomic History  . Marital status: Divorced    Spouse name: Not on file  . Number of children: 0  . Years of education: 59  . Highest education level: 12th grade  Occupational History  . Occupation: Disabled    Employer: [employer]  Tobacco Use  . Smoking status: Former Smoker    Packs/day: 0.50    Years: 22.00    Pack years: 11.00    Types: Cigarettes    Quit date: 05/22/2015    Years since quitting: 4.4  . Smokeless tobacco: Never Used  Substance and Sexual Activity  . Alcohol use: Yes    Alcohol/week: 0.0 standard drinks    Comment: rare  . Drug use: No  . Sexual activity: Never    Birth control/protection: I.U.D.  Other Topics Concern  . Not on file  Social History Narrative   Patient is right-handed.   Social Determinants of Health   Financial Resource Strain:   . Difficulty of Paying Living Expenses:   Food  Insecurity:   . Worried About Charity fundraiser in the Last Year:   . Arboriculturist in the Last Year:   Transportation Needs:   . Film/video editor (Medical):   [name] Lack of Transportation (Non-Medical):   Physical Activity:   . Days of Exercise per Week:   . Minutes of Exercise per Session:   Stress:   . Feeling of Stress :   Social Connections:   . Frequency of Communication with Friends and Family:   . Frequency of Social Gatherings with Friends and Family:   . Attends Religious Services:   . Active Member of Clubs or Organizations:   . Attends Archivist Meetings:   [name] Marital Status:   Intimate Partner Violence:   . Fear of Current or Ex-Partner:   . Emotionally Abused:   [name] Physically Abused:   . Sexually Abused:     PHYSICAL EXAM: Blood pressure 130/79, pulse 77, height '5\' 1"'  (1.549 m), weight (!) 321 lb 6.4 oz (145.8 kg), SpO2 95 %. General: No acute distress.  Patient appears well-groomed.   Head:  Normocephalic/atraumatic Eyes:  Fundi examined but not visualized Neck: supple, no paraspinal tenderness, full range of motion Heart:  Regular rate and rhythm Lungs:  Clear to auscultation bilaterally Back: No paraspinal tenderness Neurological Exam: alert and oriented to person, place, and time. Attention span and concentration intact, recent and remote memory intact, fund of knowledge intact.  Speech fluent and not dysarthric, language intact.  CN II-XII intact. Bulk and tone normal, muscle strength 5/5 throughout.  Sensation to light touch intact.  Deep tendon reflexes 2+ throughout.  Finger to nose and heel to shin testing intact.  Gait steady  IMPRESSION: 1.  Benign essential tremor 2.  Left ulnar neuropathy 3.  Polyneuropathy, idiopathic 3.  Morbid obesity (BMI 60.73 kg/m2)  PLAN: 1.  Primidone titrate to 85m in AM and 1562mat bedtime for one week, then 5043mn AM and 150m85m bedtime 2.  Will  write script so she may be fitted for an elbow pad 3.   Diet and weight loss  4.  Follow up in 9 months  Metta Clines, DO  CC: [name], MD

## 2019-10-20 ENCOUNTER — Ambulatory Visit: Payer: Medicare Other | Admitting: Neurology

## 2019-10-20 ENCOUNTER — Encounter: Payer: Self-pay | Admitting: Internal Medicine

## 2019-10-20 ENCOUNTER — Encounter: Payer: Self-pay | Admitting: Neurology

## 2019-10-20 ENCOUNTER — Other Ambulatory Visit: Payer: Self-pay

## 2019-10-20 ENCOUNTER — Ambulatory Visit: Payer: Medicare Other | Admitting: Internal Medicine

## 2019-10-20 VITALS — BP 110/70 | HR 70 | Ht 61.0 in | Wt 321.0 lb

## 2019-10-20 VITALS — BP 130/79 | HR 77 | Ht 61.0 in | Wt 321.4 lb

## 2019-10-20 DIAGNOSIS — G5622 Lesion of ulnar nerve, left upper limb: Secondary | ICD-10-CM | POA: Diagnosis not present

## 2019-10-20 DIAGNOSIS — G609 Hereditary and idiopathic neuropathy, unspecified: Secondary | ICD-10-CM | POA: Diagnosis not present

## 2019-10-20 DIAGNOSIS — E282 Polycystic ovarian syndrome: Secondary | ICD-10-CM | POA: Diagnosis not present

## 2019-10-20 DIAGNOSIS — E039 Hypothyroidism, unspecified: Secondary | ICD-10-CM

## 2019-10-20 DIAGNOSIS — E1165 Type 2 diabetes mellitus with hyperglycemia: Secondary | ICD-10-CM | POA: Diagnosis not present

## 2019-10-20 DIAGNOSIS — G250 Essential tremor: Secondary | ICD-10-CM | POA: Diagnosis not present

## 2019-10-20 LAB — POCT GLYCOSYLATED HEMOGLOBIN (HGB A1C): Hemoglobin A1C: 7.6 % — AB (ref 4.0–5.6)

## 2019-10-20 MED ORDER — JARDIANCE 10 MG PO TABS: 10.0000 mg | ORAL_TABLET | Freq: Every day | ORAL | 3 refills | Status: DC

## 2019-10-20 MED ORDER — PRIMIDONE 50 MG PO TABS: ORAL_TABLET | ORAL | 3 refills | Status: DC

## 2019-10-20 MED ORDER — METFORMIN HCL ER 500 MG PO TB24: 500.0000 mg | ORAL_TABLET | Freq: Every day | ORAL | 3 refills | Status: DC

## 2019-10-20 NOTE — Progress Notes (Signed)
Patient ID: Autumn Massey, female   DOB: 24-Feb-1966, 54 y.o.   MRN: [id number]  This visit occurred during the SARS-CoV-2 public health emergency.  Safety protocols were in place, including screening questions prior to the visit, additional usage of staff PPE, and extensive cleaning of exam room while observing appropriate contact time as indicated for disinfecting solutions.   HPI  Autumn Massey is a 54 y.o.-year-old female, returning for f/u for DM2, hypothyroidism, and PCOS. Last visit 6 months ago. She is on disability now.  She has problems with her grandmother, 39, y/o,  for whom she is the caregiver after she fell and broke her humerus.  PCOS: Reviewed history:  - weight gain: Maximal weight 364  - was on Phentermine, which worked initially, then stopped working  - Would consider gastric bypass but only as a last resort - + hirsutism >> started to see facial hair in her 30s - + hair loss >> female pattern, and bald spot in vertex - No acne - had a Mirena IUD placed 04/2013 for heavy menstrual cycles >> out now; no menses since IUD out - has frequent Prednisone tapers    She was previously on spironolactone 50 mg twice daily but her potassium increased so we had to decrease the dose to 25 mg twice daily.  We ended up stopping spironolactone 11/2016.  However, she started to have more facial hair and more frontal balding so we restarted spironolactone 25 mg twice a day.  Her prolactin level is normal now Lab Results  Component Value Date   K 4.4 04/26/2019   Hypothyroidism: Pt. has been dx with hypothyroidism in 2005.  We decreased the dose at last visit as TSH was still suppressed. She did not return for repeat labs...  Pt is on levothyroxine 200 mcg daily, taken: - in am - fasting - at least 30 min from b'fast - no Ca, Fe, MVI, PPIs - not on Biotin  Reviewed her TFTs: Lab Results  Component Value Date   TSH 0.19 (L) 03/01/2019   TSH 0.16 (L) 01/26/2019   TSH  0.65 07/26/2018   TSH 6.36 (H) 04/30/2018   TSH 0.16 (L) 03/02/2018   TSH 0.26 (L) 01/21/2018   TSH 1.29 05/05/2017   TSH 5.30 (H) 03/10/2017   TSH 8.74 (H) 12/01/2016   TSH 14.632 (H) 10/15/2016   FREET4 1.23 03/01/2019   FREET4 1.21 07/26/2018   FREET4 0.97 04/30/2018   FREET4 1.32 03/02/2018   FREET4 1.25 05/05/2017   FREET4 1.02 03/10/2017   FREET4 0.86 12/01/2016   FREET4 1.05 10/15/2016   FREET4 0.77 04/22/2016   FREET4 1.08 07/23/2015    Pt denies: - feeling nodules in neck - hoarseness - dysphagia - choking - SOB with lying down  She was on Neurontin for tremors, but had to decrease the dose because of somnolence and then she was able to stop. She is on primidone >> restarted by Dr. Tomi Likens and increased today.  She is on Lyrica at night >> will switch to Gabapentin.  DM2:  Reviewed HbA1c levels: 04/26/2019: HbA1c calculated from fructosamine is 5.4% 03/09/2018: HbA1c calculated from fructosamine is better, at 5.5%! 09/02/2017: HbA1c calculated from Fructosamine is 6.1%. 05/05/2017: HbA1c calculated from Fructosamine is 6.00%. 03/10/2017: HbA1c calculated from Fructosamine is 6.17% (great!). 11/2016: HbA1c calculated from the fructosamine is radically better, at 5.77%! Lab Results  Component Value Date   HGBA1C 7.2 (H) 04/26/2019   HGBA1C 7.3 (H) 01/26/2019   HGBA1C 7.8 (A)  08/30/2018   She is on: - Metformin 1000 mg with dinner >> 500 mg with dinner >> in a.m.  - Jardiance 10 mg in the pm -  started 02/2017 >> stopped b/c UTI and also had a tear on her labia and was afraid that this could have gotten infected if she continued with Jardiance.  We restarted Jardiance in 02/2018 and she is tolerating it well now  Not taking Januvia >> not covered. She was previously on glipizide  She checks her sugars seldom: - am: 1127-140 >> 124-134, 140 >> 131 >> n/c >> 108, 120-130s, 147 - lunch: n/c - after lunch: 145-150 >> 142 >> 131-152 >> n/c - dinner: n/c >> 120-140,  157 >> n/c - after dinner: 140-150 >> up to 160 >> 108 >> 150-160, 170  She quit smoking 05/2015.  She is on weekly ergocalciferol.  She had a D&C on 04/28/2019.  ROS: Constitutional: + Weight gain/no weight loss, no fatigue, no subjective hyperthermia, no subjective hypothermia Eyes: no blurry vision, no xerophthalmia ENT: no sore throat, + see HPI Cardiovascular: no CP/no SOB/no palpitations/no leg swelling Respiratory: no cough/no SOB/no wheezing Gastrointestinal: no N/no V/no D/no C/no acid reflux Musculoskeletal: no muscle aches/no joint aches Skin: no rashes, no hair loss Neurological: + Tremors/no numbness/no tingling/no dizziness  I reviewed pt's medications, allergies, PMH, social hx, family hx, and changes were documented in the history of present illness. Otherwise, unchanged from my initial visit note.  Past Medical History:  Diagnosis Date  . Anxiety   . ANXIETY DEPRESSION 01/29/2009   04/26/2019- no current  . CHF (congestive heart failure) ([place])   . DEPRESSION 11/19/2007  . Diabetes ([place])   . FREQUENCY, URINARY 10/17/2009  . HYPERLIPIDEMIA 11/19/2007  . HYPERTENSION 11/19/2007  . HYPOTHYROIDISM 11/19/2007  . Morbid obesity ([place])   . Neuropathy 01/26/2019  . Neuropathy   . PNEUMONIA 08/06/2010  . POLYCYTHEMIA 01/29/2009  . Preventative health care 04/06/2011  . Sleep apnea    mild sleep apnea, on o2 at 2l at nighttime  . SLEEP RELATED HYPOVENTILATION/HYPOXEMIA CCE 02/08/2009  . Venous insufficiency 07/19/2012   Past Surgical History:  Procedure Laterality Date  . DILITATION & CURRETTAGE/HYSTROSCOPY WITH NOVASURE ABLATION N/A 04/28/2019   Procedure: DILATATION & CURETTAGE/HYSTEROSCOPY WITH NOVASURE ABLATION;  Surgeon: Molli Posey, MD;  Location: [facility];  Service: Gynecology;  Laterality: N/A;  [employer] rep will be here confirmed on 04/20/19  . TONSILLECTOMY    . ULNAR TUNNEL RELEASE Left 02/22/2013   Procedure: LEFT ULNAR NERVE DECOMPRESSION ;  Surgeon: Tennis Must, MD;  Location: [facility];  Service: Orthopedics;  Laterality: Left;   Social History   Socioeconomic History  . Marital status: Divorced    Spouse name: Not on file  . Number of children: 0  . Years of education: 31  . Highest education level: 12th grade  Occupational History  . Occupation: Disabled    Employer: [employer]  Tobacco Use  . Smoking status: Former Smoker    Packs/day: 0.50    Years: 22.00    Pack years: 11.00    Types: Cigarettes    Quit date: 05/22/2015    Years since quitting: 4.4  . Smokeless tobacco: Never Used  Substance and Sexual Activity  . Alcohol use: Yes    Alcohol/week: 0.0 standard drinks    Comment: rare  . Drug use: No  . Sexual activity: Never    Birth control/protection: I.U.D.  Other Topics Concern  . Not  on file  Social History Narrative   Patient is right-handed.   Social Determinants of Health   Financial Resource Strain:   . Difficulty of Paying Living Expenses:   Food Insecurity:   . Worried About Charity fundraiser in the Last Year:   . Arboriculturist in the Last Year:   Transportation Needs:   . Film/video editor (Medical):   [name] Lack of Transportation (Non-Medical):   Physical Activity:   . Days of Exercise per Week:   . Minutes of Exercise per Session:   Stress:   . Feeling of Stress :   Social Connections:   . Frequency of Communication with Friends and Family:   . Frequency of Social Gatherings with Friends and Family:   . Attends Religious Services:   . Active Member of Clubs or Organizations:   . Attends Archivist Meetings:   [name] Marital Status:   Intimate Partner Violence:   . Fear of Current or Ex-Partner:   . Emotionally Abused:   [name] Physically Abused:   . Sexually Abused:    Current Outpatient Medications on File Prior to Visit  Medication Sig Dispense Refill  . Accu-Chek FastClix Lancets MISC Use to check blood sugar once a day 100 each 3  . acetaminophen  (TYLENOL) 500 MG tablet Take 1,000 mg by mouth every 6 (six) hours as needed for mild pain, moderate pain, fever or headache.     . albuterol (PROAIR HFA) 108 (90 Base) MCG/ACT inhaler 2 puffs every 4 hours as needed only  if your can't catch your breath (Patient taking differently: Inhale 2 puffs into the lungs every 4 (four) hours as needed for shortness of breath. 2 puffs every 4 hours as needed only  if your can't catch your breath) 1 Inhaler 5  . albuterol (PROVENTIL) (2.5 MG/3ML) 0.083% nebulizer solution Take 3 mLs (2.5 mg total) by nebulization every 6 (six) hours as needed for wheezing or shortness of breath. 75 mL 0  . aspirin EC 81 MG tablet Take 81 mg by mouth daily.    . bisoprolol-hydrochlorothiazide (ZIAC) 5-6.25 MG tablet Take 1 tablet by mouth daily. Annual appt due in July must see provider for future refills 90 tablet 0  . Blood Glucose Monitoring Suppl (ACCU-CHEK AVIVA PLUS) w/Device KIT Use to check blood sugar once a day 1 kit 0  . Cholecalciferol (VITAMIN D3) 125 MCG (5000 UT) CAPS Take 5,000 Units by mouth daily.    . Coenzyme Q10-Vitamin E (COQ10-VITAMIN E) 200-20 MG-UNIT CAPS Take 1 capsule by mouth daily.     . cyclobenzaprine (FLEXERIL) 5 MG tablet TAKE 1 TABLET BY MOUTH 3  TIMES DAILY AS NEEDED FOR  MUSCLE SPASM(S) 270 tablet 1  . Diclofenac Sodium (PENNSAID) 2 % SOLN Place 2 g onto the skin 2 (two) times daily. 112 g 3  . FLUoxetine (PROZAC) 20 MG capsule TAKE 1 CAPSULE BY MOUTH EVERY DAY Annual appt due in July must see provider for future refills 90 capsule 0  . fluticasone furoate-vilanterol (BREO ELLIPTA) 100-25 MCG/INH AEPB INHALE 1 PUFF INTO THE LUNGS EVERY MORNING. 90 each 0  . furosemide (LASIX) 40 MG tablet TAKE 1 TABLET BY MOUTH  EVERY OTHER DAY AS NEEDED. Annual appt due in July must see provider for future refills 45 tablet 0  . Gabapentin Enacarbil ER (HORIZANT) 300 MG TBCR Take 300 mg by mouth at bedtime. 90 tablet 3  . GARLIC PO Take 1 tablet by mouth daily.     [name]  glucose blood (ACCU-CHEK AVIVA) test strip Use to check blood sugar once a day - Aviva Plus 100 each 3  . ibuprofen (ADVIL) 200 MG tablet Take 3 tablets (600 mg total) by mouth every 6 (six) hours as needed. 30 tablet 1  . irbesartan (AVAPRO) 75 MG tablet Take 1 tablet (75 mg total) by mouth at bedtime. Annual appt due in July must see provider for future refills 90 tablet 0  . JARDIANCE 10 MG TABS tablet TAKE 1 TABLET BY MOUTH EVERY DAY 30 tablet 0  . Lancets Misc. (ACCU-CHEK FASTCLIX LANCET) KIT Use to check blood sugar once a day 1 kit 0  . levothyroxine (SYNTHROID, LEVOTHROID) 200 MCG tablet TAKE 1 TABLET BY MOUTH  DAILY.. 60 tablet 5  . metFORMIN (GLUCOPHAGE-XR) 500 MG 24 hr tablet Take 1 tablet (500 mg total) by mouth daily with breakfast. 90 tablet 3  . montelukast (SINGULAIR) 10 MG tablet Take 1 tablet (10 mg total) by mouth at bedtime. Annual appt due in July must see provider for future refills 90 tablet 0  . oxyCODONE-acetaminophen (PERCOCET) 5-325 MG tablet Take 1 tablet by mouth every 6 (six) hours as needed for severe pain. 20 tablet 0  . OXYGEN 2lpm with sleep only  [facility]    . pregabalin (LYRICA) 50 MG capsule TAKE 1 CAPSULE BY MOUTH 3  TIMES DAILY 270 capsule 1  . primidone (MYSOLINE) 50 MG tablet Take 1 tablet in morning and 3 tablets at bedtime 360 tablet 3  . simvastatin (ZOCOR) 40 MG tablet TAKE 1 TABLET BY MOUTH AT 6 PM Annual appt due in July must see provider for future refills 90 tablet 0  . spironolactone (ALDACTONE) 25 MG tablet TAKE 1 TABLET BY MOUTH TWICE A DAY 180 tablet 0  . Turmeric 500 MG CAPS Take 500 mg by mouth 2 (two) times daily.     No current facility-administered medications on file prior to visit.   Allergies  Allergen Reactions  . Cymbalta [Duloxetine Hcl]    Family History  Problem Relation Age of Onset  . Cancer Mother        melanoma   . Cancer Maternal Grandfather        prostate cancer  . Alcohol abuse Other   . Arthritis Other   .  Hypertension Other   . High Cholesterol Sister   . Psoriasis Brother   . Osteoarthritis Maternal Grandmother 99  . Scoliosis Maternal Grandmother    PE: BP 110/70   Pulse 70   Ht 5' 1" (1.549 m)   Wt (!) 321 lb (145.6 kg)   SpO2 95%   BMI 60.65 kg/m  Wt Readings from Last 3 Encounters:  10/20/19 (!) 321 lb (145.6 kg)  10/20/19 (!) 321 lb 6.4 oz (145.8 kg)  09/05/19 (!) 322 lb (146.1 kg)   Constitutional: overweight, in NAD Eyes: PERRLA, EOMI, no exophthalmos ENT: moist mucous membranes, no thyromegaly, no cervical lymphadenopathy Cardiovascular: RRR, No MRG Respiratory: CTA B Gastrointestinal: abdomen soft, NT, ND, BS+ Musculoskeletal: no deformities, strength intact in all 4 Skin: moist, warm, no rashes Neurological: no tremor with outstretched hands, DTR normal in all 4  PLAN:  1. Patient with longstanding hypothyroidism -Latest lab results from 03/2019 reviewed with the patient: TSH was suppressed after which we reduced her levothyroxine dose but she did not return for labs after the change in dose: Lab Results  Component Value Date   TSH 0.19 (L) 03/01/2019   - she continues on LT4 200  mcg daily - pt feels good on this dose. - we discussed about taking the thyroid hormone every day, with water, >30 minutes before breakfast, separated by >4 hours from acid reflux medications, calcium, iron, multivitamins. Pt. is taking it correctly. - will check thyroid tests today: TSH and fT4 - If labs are abnormal, she will need to return for repeat TFTs in 1.5 months  2. PCOS -She is not age of menopause and she is off her IUD without heavy menstrual cycles -Since she had increased facial hair and alopecia, we restarted spironolactone 25 mg twice a day.  She is also on as she has ACE inhibitor and ARB so we have to be careful with her potassium.  Latest level was normal 6 months ago: Lab Results  Component Value Date   K 4.4 04/26/2019  -She did not feel a significant improvement  in her hirsutism after starting spironolactone but this is a low dose, however, we cannot increase it further due to previous hyperkalemia  3. DM2 -This is well controlled on p.o. medications only (Metformin and SGLT 2 inhibitor).  - She is on a low-dose Metformin ER and also back on Jardiance before last visit.  In the past, we had to stop Jardiance due to muscle cramps and then a labial tear.  However, she is tolerating this well now. -At last visit, the HbA1c calculated from fructosamine was better, at 5.4%.  Fructosamine levels are more accurate for her.  At that time, we moved Jardiance in the morning and Metformin with breakfast since she did was telling me that she cannot tolerate this better when taken in the morning.  At this visit, she is taking Jardiance after she eats and I advised her to move this before breakfast.  For now we can stay with the lower doses, but we may need to increase these at next visit. -At today's visit, HbA1c is 7.6%, higher than before -we will also check a fructosamine level - I suggested: Patient Instructions  Please continue: - Metformin ER 500 mg with breakfast - Jardiance 10 mg - move this before breakfast  Please continue levothyroxine 200 g daily  Take the thyroid hormone every day, with water, at least 30 minutes before breakfast, separated by at least 4 hours from: - acid reflux medications - calcium - iron - multivitamins  Please stop at the lab.  Continue spironolactone 25 mg twice a day.  Please return in 6 months.  4.  Obesity class III -Continue Jardiance which should also help with weight loss -She had a good job losing 17 pounds total before our last 2 visits, now gained 12 lbs -In the past, I suggested a lower fat diet.  She was then doing a partially keto diet with intermittent fasting 16-8. She is trying a low fat diet. -She will soon start exercising in the pool   Office Visit on 10/20/2019  Component Date Value Ref Range  Status  . TSH 10/20/2019 0.80  0.35 - 4.50 uIU/mL Final  . Free T4 10/20/2019 1.17  0.60 - 1.60 ng/dL Final   Comment: Specimens from patients who are undergoing biotin therapy and /or ingesting biotin supplements may contain high levels of biotin.  The higher biotin concentration in these specimens interferes with this Free T4 assay.  Specimens that contain high levels  of biotin may cause false high results for this Free T4 assay.  Please interpret results in light of the total clinical presentation of the patient.    [name]  Fructosamine 10/20/2019 248  205 - 285 umol/L Final  . Hemoglobin A1C 10/20/2019 7.6* 4.0 - 5.6 % Final   TFTs normal. HbA1c calculated from fructosamine is excellent, at 5.8%.   Philemon Kingdom, MD PhD [facility]

## 2019-10-20 NOTE — Patient Instructions (Signed)
1.  Increase primidone:  Take 1/2 tablet in morning and 3 tablets at bedtime for one week       Then 1 tablet in morning and 3 tablets at bedtime 2.  Will write you a script for left elbow pad 3.  Follow up in 9 months

## 2019-10-20 NOTE — Patient Instructions (Addendum)
Please continue: - Metformin ER 500 mg with breakfast - Jardiance 10 mg - move this before breakfast  Please continue levothyroxine 200 g daily  Take the thyroid hormone every day, with water, at least 30 minutes before breakfast, separated by at least 4 hours from: - acid reflux medications - calcium - iron - multivitamins  Please stop at the lab.  Continue spironolactone 25 mg twice a day.  Please return in 6 months.

## 2019-10-21 ENCOUNTER — Ambulatory Visit: Payer: Medicare Other | Admitting: Physical Therapy

## 2019-10-21 DIAGNOSIS — M62830 Muscle spasm of back: Secondary | ICD-10-CM | POA: Diagnosis not present

## 2019-10-21 DIAGNOSIS — M6281 Muscle weakness (generalized): Secondary | ICD-10-CM

## 2019-10-21 DIAGNOSIS — G8929 Other chronic pain: Secondary | ICD-10-CM | POA: Diagnosis not present

## 2019-10-21 DIAGNOSIS — M545 Low back pain: Secondary | ICD-10-CM | POA: Diagnosis not present

## 2019-10-21 LAB — TSH: TSH: 0.8 u[IU]/mL (ref 0.35–4.50)

## 2019-10-21 LAB — T4, FREE: Free T4: 1.17 ng/dL (ref 0.60–1.60)

## 2019-10-21 NOTE — Therapy (Signed)
[facility] [address] [place], [place], [address] Phone: [phone]   Fax:  [phone]  Physical Therapy Treatment  Patient Details  Name: SHARNISE PORT MRN: [id number] Date of Birth: Oct 18, 1965 Referring Provider (PT): Dr. Hulan Saas    Encounter Date: 10/21/2019  PT End of Session - 10/21/19 1228    Visit Number  2    Date for PT Re-Evaluation  11/29/19    Authorization Type  UHC Medicare    PT Start Time  1103    PT Stop Time  1153    PT Time Calculation (min)  50 min    Activity Tolerance  Patient tolerated treatment well       Past Medical History:  Diagnosis Date  . Anxiety   . ANXIETY DEPRESSION 01/29/2009   04/26/2019- no current  . CHF (congestive heart failure) ([place])   . DEPRESSION 11/19/2007  . Diabetes ([place])   . FREQUENCY, URINARY 10/17/2009  . HYPERLIPIDEMIA 11/19/2007  . HYPERTENSION 11/19/2007  . HYPOTHYROIDISM 11/19/2007  . Morbid obesity ([place])   . Neuropathy 01/26/2019  . Neuropathy   . PNEUMONIA 08/06/2010  . POLYCYTHEMIA 01/29/2009  . Preventative health care 04/06/2011  . Sleep apnea    mild sleep apnea, on o2 at 2l at nighttime  . SLEEP RELATED HYPOVENTILATION/HYPOXEMIA CCE 02/08/2009  . Venous insufficiency 07/19/2012    Past Surgical History:  Procedure Laterality Date  . DILITATION & CURRETTAGE/HYSTROSCOPY WITH NOVASURE ABLATION N/A 04/28/2019   Procedure: DILATATION & CURETTAGE/HYSTEROSCOPY WITH NOVASURE ABLATION;  Surgeon: Molli Posey, MD;  Location: [place];  Service: Gynecology;  Laterality: N/A;  Novasure rep will be here confirmed on 04/20/19  . TONSILLECTOMY    . ULNAR TUNNEL RELEASE Left 02/22/2013   Procedure: LEFT ULNAR NERVE DECOMPRESSION ;  Surgeon: Tennis Must, MD;  Location: [place];  Service: Orthopedics;  Laterality: Left;    There were no vitals filed for this visit.  Subjective Assessment - 10/21/19 1108    Subjective  I"ve been  looking forward to this all morning.  I want to do the dry needling.  I'm trying to lose weight so when this is better.  I'll be able to move more.  There is a catch in the back of my hip when I move.    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  Yes    Pain Score  5     Pain Location  Hip    Pain Orientation  Right    Pain Type  Chronic pain                       [facility] Adult PT Treatment/Exercise - 10/21/19 0001      Lumbar Exercises: Stretches   Other Lumbar Stretch Exercise  sidelying QL/ITB stretch hanging right leg forward off the bed and right UE overhead 3x 20 sec     Other Lumbar Stretch Exercise  doorway QL stretch       Lumbar Exercises: Sidelying   Clam  Right;10 reps    Clam Limitations  with abdominal brace       Moist Heat Therapy   Number Minutes Moist Heat  10 Minutes   concurrent with ex   Moist Heat Location  Lumbar Spine;Hip  Manual Therapy   Manual therapy comments  Addaday to lumbar and hip soft tissue in sidelying 3 minutes     Joint Mobilization  pelvic distraction and neutral gapping grade 3 5x 20 sec each     Soft tissue mobilization  lumbar parspinals, QL, gluteals, piriformis       Trigger Point Dry Needling - 10/21/19 0001    Consent Given?  Yes    Education Handout Provided  Yes    Muscles Treated Back/Hip  Gluteus minimus;Gluteus medius;Gluteus maximus;Piriformis;Erector spinae;Quadratus lumborum    Dry Needling Comments  in sidelying position    Other Dry Needling  right only     Gluteus Minimus Response  Palpable increased muscle length    Gluteus Medius Response  Palpable increased muscle length    Gluteus Maximus Response  Palpable increased muscle length    Piriformis Response  Palpable increased muscle length    Erector spinae Response  Palpable increased muscle length    Quadratus  Lumborum Response  Palpable increased muscle length           PT Education - 10/21/19 1227    Education Details  Access Code: [email or web address]  sidelying QL stretch, standing QL stretch; sidelying clams; dry needling after care    Person(s) Educated  Patient    Methods  Explanation;Demonstration;Handout    Comprehension  Returned demonstration;Verbalized understanding       PT Short Term Goals - 10/04/19 1734      PT SHORT TERM GOAL #1   Title  The patient will be instructed in initial HEP and self care strategies to promote mobility and function    Time  4    Period  Weeks    Status  New    Target Date  11/01/19      PT SHORT TERM GOAL #2   Title  The patient will report a 30% improvement in right LBP with standing, walking    Time  4    Period  Weeks    Status  New      PT SHORT TERM GOAL #3   Title  The patient will be able to walk (with RW) 6 minutes needed for grocery shopping    Time  4    Period  Weeks    Status  New      PT SHORT TERM GOAL #4   Title  Lumbar flexion ROM improved to 60 degrees, extension to 10 degrees and bil sidebending to 25 degrees needed for household chores, taking care of her grandmother    Time  4    Period  Weeks    Status  New        PT Long Term Goals - 10/04/19 1738      PT LONG TERM GOAL #1   Title  The patient will be independent in safe self progression of HEP    Time  8    Period  Weeks    Status  New    Target Date  11/29/19      PT LONG TERM GOAL #2   Title  The patient will report an overall 60% improvement in low back pain with standing and walking    Time  8    Period  Weeks    Status  New      PT LONG TERM GOAL #3   Title  The patient will be able to walk or stand (with RW as needed) for 8 minutes needed for grocery shopping  Time  8    Period  Weeks    Status  On-going      PT LONG TERM GOAL #4   Title  Right hip abduction, abdominal and trunk extensor strength improved to 4-/5 needed for walking/standing  longer periods of time    Time  8    Period  Weeks    Status  New      PT LONG TERM GOAL #5   Title  FOTO functional outcome score improved from 54% limitation to 44% limitation    Time  8    Period  Weeks    Status  New            Plan - 10/21/19 1228    Clinical Impression Statement  The patient has a positive initial response to dry needling and manual therapy.  Much improved joint and soft tissue mobility following treatment session.  She is receptive with stretches and initial low level core/gluteal strengthening for best outcomes.  Therapist monitoring response with all interventions.  Therapist removed mask and used shield for PPE to allow patient to read lips secondary to hearing impairment which made instruction much easier today.    Comorbidities  hearing impairment; obesity; HTN; COPD; CHF; neuropathy left UE; depression/anxiety    Rehab Potential  Good    PT Frequency  2x / week    PT Duration  8 weeks    PT Treatment/Interventions  ADLs/Self Care Home Management;Electrical Stimulation;Cryotherapy;Aquatic Therapy;Moist Heat;Traction;Ultrasound;Neuromuscular re-education;Therapeutic exercise;Therapeutic activities;Manual techniques;Patient/family education;Dry needling;Taping    PT Next Visit Plan  assess response to DN #1; review QL stretches in sidelying and standing;  clams;  progress abdominal series;  Addaday;  KT;  start aquatic ex;  shield only secondary hearing impairment (reads lips)       Patient will benefit from skilled therapeutic intervention in order to improve the following deficits and impairments:  Decreased range of motion, Difficulty walking, Increased fascial restricitons, Pain, Decreased activity tolerance, Decreased strength, Increased muscle spasms, Impaired perceived functional ability  Visit Diagnosis: Chronic right-sided low back pain, unspecified whether sciatica present  Muscle weakness (generalized)  Muscle spasm of back     Problem  List Patient Active Problem List   Diagnosis Date Noted  . Chronic right sacroiliac joint pain 09/05/2019  . Degenerative arthritis of right knee 09/05/2019  . Neuropathy 01/26/2019  . Right hip pain 04/14/2018  . COPD exacerbation ([place]) 04/16/2017  . Metatarsal stress fracture of left foot 12/08/2016  . Stress fracture of left foot 12/08/2016  . Dysuria 11/04/2016  . Low serum cortisol level ([place]) 10/21/2016  . Leukocytosis 10/15/2016  . Hyponatremia 10/14/2016  . AKI (acute kidney injury) ([place]) 10/14/2016  . Nausea 10/09/2016  . Leg cramps 10/09/2016  . Community acquired pneumonia 09/28/2016  . Chronic diastolic congestive heart failure ([place]) 09/28/2016  . Asthma exacerbation 12/20/2015  . Type 2 diabetes mellitus with hyperglycemia, without long-term current use of insulin ([place]) 11/19/2015  . Dyspnea 10/09/2015  . Chronic respiratory failure with hypoxia and hypercapnia ([place]) 07/19/2015  . COPD GOLD 0 / AB 07/18/2015  . Vaginitis 05/31/2015  . Thoracic outlet syndrome 01/29/2015  . Ulnar neuropathy at elbow of right upper extremity 12/19/2014  . Benign essential tremor 10/13/2014  . Movement disorder 08/30/2014  . Cough 08/30/2014  . Rash and nonspecific skin eruption 03/07/2014  . Skin lesion of back 03/07/2014  . PCOS (polycystic ovarian syndrome) 10/31/2013  . History of CVA (cerebrovascular accident) 08/11/2013  . Asthmatic bronchitis 06/10/2013  .  Acute meniscal tear of right knee 06/06/2013  . Sprain of MCL (medial collateral ligament) of knee 05/03/2013  . Left arm pain 02/09/2013  . Hypoxemia 07/19/2012  . Venous insufficiency 07/19/2012  . Morbid obesity due to excess calories ([place]) 07/19/2012  . Former smoker 04/11/2011  . Preventative health care 04/06/2011  . SLEEP RELATED HYPOVENTILATION/HYPOXEMIA CCE 02/08/2009  . POLYCYTHEMIA 01/29/2009  . ANXIETY DEPRESSION 01/29/2009  . Hypothyroidism 11/19/2007  . HLD (hyperlipidemia) 11/19/2007  . DEPRESSION  11/19/2007  . Essential hypertension 11/19/2007   Ruben Im, PT 10/21/19 12:34 PM Phone: [phone] Fax: [phone] Alvera Singh 10/21/2019, 12:34 PM  [facility] [address], [place], [place], [address] Phone: [phone]   Fax:  [phone]  Name: JAZMINNE MECHLING MRN: [id number] Date of Birth: 05/25/1966

## 2019-10-21 NOTE — Patient Instructions (Signed)
Access Code: [email or web address] URL: [email or web address] Date: 10/21/2019 Prepared by: Ruben Im  Exercises Hooklying Transversus Abdominis Palpation - 1 x daily - 7 x weekly - 1 sets - 10 reps - 5 hold Seated Transversus Abdominis Bracing - 1 x daily - 7 x weekly - 1 sets - 10 reps Standing Transverse Abdominis Contraction - 1 x daily - 7 x weekly - 1 sets - 10 reps   Trigger Point Dry Needling  . What is Trigger Point Dry Needling (DN)? o DN is a physical therapy technique used to treat muscle pain and dysfunction. Specifically, DN helps deactivate muscle trigger points (muscle knots).  o A thin filiform needle is used to penetrate the skin and stimulate the underlying trigger point. The goal is for a local twitch response (LTR) to occur and for the trigger point to relax. No medication of any kind is injected during the procedure.   . What Does Trigger Point Dry Needling Feel Like?  o The procedure feels different for each individual patient. Some patients report that they do not actually feel the needle enter the skin and overall the process is not painful. Very mild bleeding may occur. However, many patients feel a deep cramping in the muscle in which the needle was inserted. This is the local twitch response.   [name] How Will I feel after the treatment? o Soreness is normal, and the onset of soreness may not occur for a few hours. Typically this soreness does not last longer than two days.  o Bruising is uncommon, however; ice can be used to decrease any possible bruising.  o In rare cases feeling tired or nauseous after the treatment is normal. In addition, your symptoms may get worse before they get better, this period will typically not last longer than 24 hours.   . What Can I do After My Treatment? o Increase your hydration by drinking more water for the next 24 hours. o You may place ice or heat on the areas treated that have become sore, however, do not use heat on  inflamed or bruised areas. Heat often brings more relief post needling. o You can continue your regular activities, but vigorous activity is not recommended initially after the treatment for 24 hours. o DN is best combined with other physical therapy such as strengthening, stretching, and other therapies.

## 2019-10-25 ENCOUNTER — Other Ambulatory Visit: Payer: Self-pay | Admitting: Internal Medicine

## 2019-10-25 DIAGNOSIS — Z1231 Encounter for screening mammogram for malignant neoplasm of breast: Secondary | ICD-10-CM

## 2019-10-25 LAB — FRUCTOSAMINE: Fructosamine: 248 umol/L (ref 205–285)

## 2019-10-26 ENCOUNTER — Other Ambulatory Visit: Payer: Self-pay | Admitting: Internal Medicine

## 2019-10-27 ENCOUNTER — Encounter: Payer: Self-pay | Admitting: Physical Therapy

## 2019-10-27 ENCOUNTER — Ambulatory Visit: Payer: Medicare Other | Admitting: Physical Therapy

## 2019-10-27 ENCOUNTER — Other Ambulatory Visit: Payer: Self-pay

## 2019-10-27 DIAGNOSIS — G8929 Other chronic pain: Secondary | ICD-10-CM

## 2019-10-27 DIAGNOSIS — M62830 Muscle spasm of back: Secondary | ICD-10-CM

## 2019-10-27 DIAGNOSIS — M6281 Muscle weakness (generalized): Secondary | ICD-10-CM

## 2019-10-27 DIAGNOSIS — M545 Low back pain: Secondary | ICD-10-CM | POA: Diagnosis not present

## 2019-10-27 NOTE — Therapy (Signed)
[facility] [address] [place], [place], [address] Phone: [phone]   Fax:  [phone]  Physical Therapy Treatment  Patient Details  Name: Autumn Massey MRN: [id number] Date of Birth: [birth year] Referring Provider (PT): Dr. Hulan Saas    Encounter Date: 10/27/2019  PT End of Session - 10/27/19 1455    Visit Number  3    Date for PT Re-Evaluation  11/29/19    Authorization Type  UHC Medicare    PT Start Time  1450    PT Stop Time  1530    PT Time Calculation (min)  40 min    Activity Tolerance  Patient tolerated treatment well    Behavior During Therapy  [facility] for tasks assessed/performed       Past Medical History:  Diagnosis Date  . Anxiety   . ANXIETY DEPRESSION 01/29/2009   04/26/2019- no current  . CHF (congestive heart failure) ([place])   . DEPRESSION 11/19/2007  . Diabetes ([place])   . FREQUENCY, URINARY 10/17/2009  . HYPERLIPIDEMIA 11/19/2007  . HYPERTENSION 11/19/2007  . HYPOTHYROIDISM 11/19/2007  . Morbid obesity ([place])   . Neuropathy 01/26/2019  . Neuropathy   . PNEUMONIA 08/06/2010  . POLYCYTHEMIA 01/29/2009  . Preventative health care 04/06/2011  . Sleep apnea    mild sleep apnea, on o2 at 2l at nighttime  . SLEEP RELATED HYPOVENTILATION/HYPOXEMIA CCE 02/08/2009  . Venous insufficiency 07/19/2012    Past Surgical History:  Procedure Laterality Date  . DILITATION & CURRETTAGE/HYSTROSCOPY WITH NOVASURE ABLATION N/A 04/28/2019   Procedure: DILATATION & CURETTAGE/HYSTEROSCOPY WITH NOVASURE ABLATION;  Surgeon: Molli Posey, MD;  Location: [place];  Service: Gynecology;  Laterality: N/A;  Novasure rep will be here confirmed on 04/20/19  . TONSILLECTOMY    . ULNAR TUNNEL RELEASE Left 02/22/2013   Procedure: LEFT ULNAR NERVE DECOMPRESSION ;  Surgeon: Tennis Must, MD;  Location: [facility];  Service: Orthopedics;  Laterality: Left;    There were no vitals filed for this  visit.  Subjective Assessment - 10/27/19 1453    Subjective  I have been walking at the nursing facility a lot today with my walker to see my grandmother so I'm sore and very worn out.  I did great with the DN. I did get sore but I knew I would. I'd like to do it again.  I loved the Addaday.    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  nagging pain 30 minutes    How long can you walk comfortably?  less than 5 minutes walking; a little while longer with rolling walker    Diagnostic tests  x-rays showed early onset arthritis and scoliosis    Patient Stated Goals  relief;  weight loss    Currently in Pain?  Yes    Pain Score  7     Pain Location  Hip    Pain Orientation  Right;Posterior;Anterior    Pain Descriptors / Indicators  Stabbing;Aching    Pain Type  Chronic pain    Pain Onset  More than a month ago    Pain Frequency  Intermittent    Aggravating Factors   walking short distances    Pain Relieving Factors  supine lying, bend over  when walk                       [facility] Adult PT Treatment/Exercise - 10/27/19 0001      Exercises   Exercises  Lumbar;Shoulder      Lumbar Exercises: Stretches   Piriformis Stretch  Right;1 rep;30 seconds    Piriformis Stretch Limitations  edge of mat table      Shoulder Exercises: Seated   Flexion  AROM;Both    Flexion Limitations  2x5, PT cued exhale on effort, TrA and multifidus awareness VCs      Manual Therapy   Manual therapy comments  Addaday end of session x 1' to lumbar region (ran out of time)    Soft tissue mobilization  gluteals, piriformis, SI joint ligaments on Rt       Trigger Point Dry Needling - 10/27/19 0001    Consent Given?  Yes    Education Handout Provided  Previously provided    Muscles Treated Back/Hip   Gluteus minimus;Gluteus medius;Gluteus maximus;Piriformis    Dry Needling Comments  in sidelying position    Other Dry Needling  right only    Gluteus Minimus Response  Palpable increased muscle length    Gluteus Medius Response  Twitch response elicited;Palpable increased muscle length    Gluteus Maximus Response  Palpable increased muscle length    Piriformis Response  Twitch response elicited;Palpable increased muscle length           PT Education - 10/27/19 1529    Education Details  Access Code: [email or web address]    Person(s) Educated  Patient    Methods  Explanation;Demonstration;Handout    Comprehension  Verbalized understanding;Returned demonstration       PT Short Term Goals - 10/04/19 1734      PT SHORT TERM GOAL #1   Title  The patient will be instructed in initial HEP and self care strategies to promote mobility and function    Time  4    Period  Weeks    Status  New    Target Date  11/01/19      PT SHORT TERM GOAL #2   Title  The patient will report a 30% improvement in right LBP with standing, walking    Time  4    Period  Weeks    Status  New      PT SHORT TERM GOAL #3   Title  The patient will be able to walk (with RW) 6 minutes needed for grocery shopping    Time  4    Period  Weeks    Status  New      PT SHORT TERM GOAL #4   Title  Lumbar flexion ROM improved to 60 degrees, extension to 10 degrees and bil sidebending to 25 degrees needed for household chores, taking care of her grandmother    Time  4    Period  Weeks    Status  New        PT Long Term Goals - 10/04/19 1738      PT LONG TERM GOAL #1   Title  The patient will be independent in safe self progression of HEP    Time  8    Period  Weeks    Status  New    Target Date  11/29/19      PT LONG TERM GOAL #2   Title  The patient will report an overall 60% improvement in low back pain  with standing and walking    Time  8    Period  Weeks    Status  New      PT LONG TERM GOAL #3   Title   The patient will be able to walk or stand (with RW as needed) for 8 minutes needed for grocery shopping    Time  8    Period  Weeks    Status  On-going      PT LONG TERM GOAL #4   Title  Right hip abduction, abdominal and trunk extensor strength improved to 4-/5 needed for walking/standing longer periods of time    Time  8    Period  Weeks    Status  New      PT LONG TERM GOAL #5   Title  FOTO functional outcome score improved from 54% limitation to 44% limitation    Time  8    Period  Weeks    Status  New            Plan - 10/27/19 1752    Clinical Impression Statement  Pt arrived worn out from visiting her grandmother.  She reported soreness but relief with first DN and Addaday use from last visit.  She continues to have signif tenderness overlying Rt PSIS and SI joint.  Trigger points were present in Rt lateral and posterior hip today.  Twitch and release with DN to gluteals and piriformis.  PT updated seated edge of bed piriformis stretch and seated TrA with bil shoudler flexion to 90 with cue to exhale on effort.  Pt was sore end of session.  She will continue to benefit from skilled progression of combo of increased activity/stabilization/strength/endurance to tolerance with manual techniques.    Comorbidities  hearing impairment; obesity; HTN; COPD; CHF; neuropathy left UE; depression/anxiety    Stability/Clinical Decision Making  Evolving/Moderate complexity    Rehab Potential  Good    PT Frequency  2x / week    PT Duration  8 weeks    PT Treatment/Interventions  ADLs/Self Care Home Management;Electrical Stimulation;Cryotherapy;Aquatic Therapy;Moist Heat;Traction;Ultrasound;Neuromuscular re-education;Therapeutic exercise;Therapeutic activities;Manual techniques;Patient/family education;Dry needling;Taping    PT Next Visit Plan  NuStep and/or short distance ambulation if Pt not too worn out, Addaday to Rt lumbar/hip, f/u on updates to HEP (edge of table piri stretch and seated  bil shoulder flex to 90), continue building stabilization exercise    PT Home Exercise Plan  Access Code: [email or web address]    Consulted and Agree with Plan of Care  Patient       Patient will benefit from skilled therapeutic intervention in order to improve the following deficits and impairments:     Visit Diagnosis: Chronic right-sided low back pain, unspecified whether sciatica present  Muscle weakness (generalized)  Muscle spasm of back     Problem List Patient Active Problem List   Diagnosis Date Noted  . Chronic right sacroiliac joint pain 09/05/2019  . Degenerative arthritis of right knee 09/05/2019  . Neuropathy 01/26/2019  . Right hip pain 04/14/2018  . COPD exacerbation ([place]) 04/16/2017  . Metatarsal stress fracture of left foot 12/08/2016  . Stress fracture of left foot 12/08/2016  . Dysuria 11/04/2016  . Low serum cortisol level ([place]) 10/21/2016  . Leukocytosis 10/15/2016  . Hyponatremia 10/14/2016  . AKI (acute kidney injury) ([place]) 10/14/2016  . Nausea 10/09/2016  . Leg cramps 10/09/2016  . Community acquired pneumonia 09/28/2016  . Chronic diastolic congestive heart failure ([place]) 09/28/2016  . Asthma exacerbation 12/20/2015  .  Type 2 diabetes mellitus with hyperglycemia, without long-term current use of insulin ([place]) 11/19/2015  . Dyspnea 10/09/2015  . Chronic respiratory failure with hypoxia and hypercapnia ([place]) 07/19/2015  . COPD GOLD 0 / AB 07/18/2015  . Vaginitis 05/31/2015  . Thoracic outlet syndrome 01/29/2015  . Ulnar neuropathy at elbow of right upper extremity 12/19/2014  . Benign essential tremor 10/13/2014  . Movement disorder 08/30/2014  . Cough 08/30/2014  . Rash and nonspecific skin eruption 03/07/2014  . Skin lesion of back 03/07/2014  . PCOS (polycystic ovarian syndrome) 10/31/2013  . History of CVA (cerebrovascular accident) 08/11/2013  . Asthmatic bronchitis 06/10/2013  . Acute meniscal tear of right knee 06/06/2013  . Sprain of MCL  (medial collateral ligament) of knee 05/03/2013  . Left arm pain 02/09/2013  . Hypoxemia 07/19/2012  . Venous insufficiency 07/19/2012  . Morbid obesity due to excess calories ([place]) 07/19/2012  . Former smoker 04/11/2011  . Preventative health care 04/06/2011  . SLEEP RELATED HYPOVENTILATION/HYPOXEMIA CCE 02/08/2009  . POLYCYTHEMIA 01/29/2009  . ANXIETY DEPRESSION 01/29/2009  . Hypothyroidism 11/19/2007  . HLD (hyperlipidemia) 11/19/2007  . DEPRESSION 11/19/2007  . Essential hypertension 11/19/2007    Baruch Merl, PT 10/27/19 6:02 PM   [facility] [address], [place], [place], [address] Phone: [phone]   Fax:  [phone]  Name: CHANAH OFALLON MRN: [id number] Date of Birth: September 21, 1965

## 2019-10-27 NOTE — Patient Instructions (Signed)
Access Code: [email or web address] URL: [email or web address]: 04/29/2021Prepared by: Venetia Night BeuhringExercises  Hooklying Transversus Abdominis Palpation - 1 x daily - 7 x weekly - 1 sets - 10 reps - 5 hold  Seated Transversus Abdominis Bracing - 1 x daily - 7 x weekly - 1 sets - 10 reps  Standing Transverse Abdominis Contraction - 1 x daily - 7 x weekly - 1 sets - 10 reps  Seated Table Piriformis Stretch - 1 x daily - 7 x weekly - 3 sets - 3 reps - 20 hold  Seated Shoulder Flexion - 1 x daily - 7 x weekly - 3 sets - 5 reps

## 2019-11-03 ENCOUNTER — Ambulatory Visit: Payer: Medicare Other | Attending: Family Medicine | Admitting: Physical Therapy

## 2019-11-03 ENCOUNTER — Other Ambulatory Visit: Payer: Self-pay

## 2019-11-03 DIAGNOSIS — M545 Low back pain: Secondary | ICD-10-CM | POA: Insufficient documentation

## 2019-11-03 DIAGNOSIS — M6281 Muscle weakness (generalized): Secondary | ICD-10-CM | POA: Diagnosis not present

## 2019-11-03 DIAGNOSIS — G8929 Other chronic pain: Secondary | ICD-10-CM | POA: Diagnosis not present

## 2019-11-03 NOTE — Therapy (Addendum)
[facility] [address], [place], [place], [address] Phone: [phone]   Fax:  [phone]  Physical Therapy Treatment/Discharge Summary   Patient Details  Name: Autumn Massey MRN: [id number] Date of Birth: [birth year] Referring Provider (PT): Dr. Hulan Saas    Encounter Date: 11/03/2019  PT End of Session - 11/03/19 1742    Visit Number  4    Date for PT Re-Evaluation  11/29/19    Authorization Type  [employer] Medicare    PT Start Time  3149    PT Stop Time  1535    PT Time Calculation (min)  50 min    Activity Tolerance  Patient tolerated treatment well       Past Medical History:  Diagnosis Date  . Anxiety   . ANXIETY DEPRESSION 01/29/2009   04/26/2019- no current  . CHF (congestive heart failure) ([place])   . DEPRESSION 11/19/2007  . Diabetes ([place])   . FREQUENCY, URINARY 10/17/2009  . HYPERLIPIDEMIA 11/19/2007  . HYPERTENSION 11/19/2007  . HYPOTHYROIDISM 11/19/2007  . Morbid obesity ([place])   . Neuropathy 01/26/2019  . Neuropathy   . PNEUMONIA 08/06/2010  . POLYCYTHEMIA 01/29/2009  . Preventative health care 04/06/2011  . Sleep apnea    mild sleep apnea, on o2 at 2l at nighttime  . SLEEP RELATED HYPOVENTILATION/HYPOXEMIA CCE 02/08/2009  . Venous insufficiency 07/19/2012    Past Surgical History:  Procedure Laterality Date  . DILITATION & CURRETTAGE/HYSTROSCOPY WITH NOVASURE ABLATION N/A 04/28/2019   Procedure: DILATATION & CURETTAGE/HYSTEROSCOPY WITH NOVASURE ABLATION;  Surgeon: Molli Posey, MD;  Location: [place];  Service: Gynecology;  Laterality: N/A;  [employer] rep will be here confirmed on 04/20/19  . TONSILLECTOMY    . ULNAR TUNNEL RELEASE Left 02/22/2013   Procedure: LEFT ULNAR NERVE DECOMPRESSION ;  Surgeon: Tennis Must, MD;  Location: [place];  Service: Orthopedics;  Laterality: Left;    There were no vitals filed for this visit.  Subjective Assessment - 11/03/19 1450    Subjective   I like the needling.  I feel it in my groin today.  I have more strength in my back and less tightness.  20- 30% improvement overall.  Using the walker helps my back but hurts my arms a lot.  Patient requests to cancel her next 2 pool appts for financial reasons.    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  with walker car to nursing facility, down hall 2-3 minutes   3x/week    Currently in Pain?  Yes    Pain Score  5     Pain Location  Hip    Pain Orientation  Right    Pain Radiating Towards  groin area    Aggravating Factors   right before period starts                       [facility] Adult PT Treatment/Exercise - 11/03/19 0001      Therapeutic Activites    Other Therapeutic Activities  RW height adjustment and arms close to ribs for more efficient power       Knee/Hip Exercises: Stretches   Hip Flexor Stretch  Right;3 reps    Hip Flexor Stretch Limitations  supine Thomas test position and standing hip flexor  stretch per HEP       Moist Heat Therapy   Number Minutes Moist Heat  10 Minutes    Moist Heat Location  Hip      Electrical Stimulation   Electrical Stimulation Location  right anterior hip    Electrical Stimulation Action  IFC    Electrical Stimulation Parameters  10 minutes 15 ma     Electrical Stimulation Goals  Pain      Manual Therapy   Soft tissue mobilization  right hip flexors, gluteals        Trigger Point Dry Needling - 11/03/19 0001    Consent Given?  Yes    Muscles Treated Lower Quadrant  Quadriceps    Muscles Treated Back/Hip  Iliopsoas    Other Dry Needling  right only    Gluteus Maximus Response  Palpable increased muscle length    Iliopsoas Response  Twitch response elicited;Palpable increased muscle length    Quadratus Lumborum Response  Twitch response elicited;Palpable increased muscle  length   proximal quads only           PT Education - 11/03/19 1530    Education Details  home TENs info;  walker adjustments for better use;  walking time progression; hip flexor stretches in supine and standing    Person(s) Educated  Patient    Methods  Explanation;Demonstration;Handout    Comprehension  Returned demonstration;Verbalized understanding       PT Short Term Goals - 11/03/19 1525      PT SHORT TERM GOAL #1   Title  The patient will be instructed in initial HEP and self care strategies to promote mobility and function    Status  Achieved      PT SHORT TERM GOAL #2   Title  The patient will report a 30% improvement in right LBP with standing, walking    Status  Achieved      PT SHORT TERM GOAL #3   Title  The patient will be able to walk (with RW) 6 minutes needed for grocery shopping    Time  4    Period  Weeks    Status  On-going      PT SHORT TERM GOAL #4   Title  Lumbar flexion ROM improved to 60 degrees, extension to 10 degrees and bil sidebending to 25 degrees needed for household chores, taking care of her grandmother    Time  4    Period  Weeks    Status  On-going        PT Long Term Goals - 10/04/19 1738      PT LONG TERM GOAL #1   Title  The patient will be independent in safe self progression of HEP    Time  8    Period  Weeks    Status  New    Target Date  11/29/19      PT LONG TERM GOAL #2   Title  The patient will report an overall 60% improvement in low back pain with standing and walking    Time  8    Period  Weeks    Status  New      PT LONG TERM GOAL #3   Title  The patient will be able to walk or stand (with RW as needed) for 8 minutes needed for grocery shopping    Time  8    Period  Weeks    Status  On-going      PT LONG  TERM GOAL #4   Title  Right hip abduction, abdominal and trunk extensor strength improved to 4-/5 needed for walking/standing longer periods of time    Time  8    Period  Weeks    Status  New       PT LONG TERM GOAL #5   Title  FOTO functional outcome score improved from 54% limitation to 44% limitation    Time  8    Period  Weeks    Status  New            Plan - 11/03/19 1531    Clinical Impression Statement  The patient rates her overall progress at 20-30% better.  Her primary complaint today is right groin pain which causes decreased stride length and shortened stance time on right.  She has numerous tender points in right hip flexors and she is receptive to DN and manual therapy.  She notes she is able to tolerate lying supine much better than she was able to in the past.  She has a positive response to this as well as ES/heat.  She was given info on a home TENS unit.  She had to cancel her aquatic therapy appointments secondary to financial reasons but plans to try it in 3 weeks.  Therapist closely monitoring response with all interventions.    Comorbidities  hearing impairment; obesity; HTN; COPD; CHF; neuropathy left UE; depression/anxiety    Examination-Activity Limitations  Hygiene/Grooming;Lift;Locomotion Level;Bed Mobility;Bend;Caring for Others;Sit;Carry;Squat;Stairs;Stand    Rehab Potential  Good    PT Frequency  2x / week    PT Duration  8 weeks    PT Treatment/Interventions  ADLs/Self Care Home Management;Electrical Stimulation;Cryotherapy;Aquatic Therapy;Moist Heat;Traction;Ultrasound;Neuromuscular re-education;Therapeutic exercise;Therapeutic activities;Manual techniques;Patient/family education;Dry needling;Taping    PT Next Visit Plan  recheck lumbar ROM for STGs;  NuStep and/or short distance ambulation;  Addaday to Rt lumbar/hip;  DN to hip flexors/gluteals as needed;  hip flexor stretching;  continue building stabilization exercise    PT Home Exercise Plan  Access Code: [email or web address]    Recommended Other Services  use shield instead of mask secondary to hearing impairment/reads lips       Patient will benefit from skilled therapeutic intervention in order to improve  the following deficits and impairments:  Decreased range of motion, Difficulty walking, Increased fascial restricitons, Pain, Decreased activity tolerance, Decreased strength, Increased muscle spasms, Impaired perceived functional ability  Visit Diagnosis: Chronic right-sided low back pain, unspecified whether sciatica present  Muscle weakness (generalized)    PHYSICAL THERAPY DISCHARGE SUMMARY  Visits from Start of Care: 4  Current functional level related to goals / functional outcomes: The patient cancelled her last several appt secondary to the death of her grandmother.  She has not made contact to reschedule in the last 2 months.  Will discharge from PT at this time.  She will need a new order to return to PT.     Remaining deficits: As above   Education / Equipment: HEP Plan:                                                    Patient goals were not met. Patient is being discharged due to not returning since the last visit.  ?????        Problem List Patient Active Problem List   Diagnosis Date Noted  .  Chronic right sacroiliac joint pain 09/05/2019  . Degenerative arthritis of right knee 09/05/2019  . Neuropathy 01/26/2019  . Right hip pain 04/14/2018  . COPD exacerbation ([place]) 04/16/2017  . Metatarsal stress fracture of left foot 12/08/2016  . Stress fracture of left foot 12/08/2016  . Dysuria 11/04/2016  . Low serum cortisol level ([place]) 10/21/2016  . Leukocytosis 10/15/2016  . Hyponatremia 10/14/2016  . AKI (acute kidney injury) ([place]) 10/14/2016  . Nausea 10/09/2016  . Leg cramps 10/09/2016  . Community acquired pneumonia 09/28/2016  . Chronic diastolic congestive heart failure ([place]) 09/28/2016  . Asthma exacerbation 12/20/2015  . Type 2 diabetes mellitus with hyperglycemia, without long-term current use of insulin ([place]) 11/19/2015  . Dyspnea 10/09/2015  . Chronic respiratory failure with hypoxia and hypercapnia ([place]) 07/19/2015  . COPD GOLD 0 / AB  07/18/2015  . Vaginitis 05/31/2015  . Thoracic outlet syndrome 01/29/2015  . Ulnar neuropathy at elbow of right upper extremity 12/19/2014  . Benign essential tremor 10/13/2014  . Movement disorder 08/30/2014  . Cough 08/30/2014  . Rash and nonspecific skin eruption 03/07/2014  . Skin lesion of back 03/07/2014  . PCOS (polycystic ovarian syndrome) 10/31/2013  . History of CVA (cerebrovascular accident) 08/11/2013  . Asthmatic bronchitis 06/10/2013  . Acute meniscal tear of right knee 06/06/2013  . Sprain of MCL (medial collateral ligament) of knee 05/03/2013  . Left arm pain 02/09/2013  . Hypoxemia 07/19/2012  . Venous insufficiency 07/19/2012  . Morbid obesity due to excess calories ([place]) 07/19/2012  . Former smoker 04/11/2011  . Preventative health care 04/06/2011  . SLEEP RELATED HYPOVENTILATION/HYPOXEMIA CCE 02/08/2009  . POLYCYTHEMIA 01/29/2009  . ANXIETY DEPRESSION 01/29/2009  . Hypothyroidism 11/19/2007  . HLD (hyperlipidemia) 11/19/2007  . DEPRESSION 11/19/2007  . Essential hypertension 11/19/2007   Ruben Im, PT 11/03/19 5:51 PM Phone: [phone] Fax: [phone] Alvera Singh 11/03/2019, 5:51 PM  [facility] [address], [place], [place], [address] Phone: [phone]   Fax:  [phone]  Name: Autumn Massey MRN: [id number] Date of Birth: 1966-05-19

## 2019-11-03 NOTE — Patient Instructions (Addendum)
TENS UNIT  This is helpful for muscle pain and spasm.   Search and Purchase a TENS 7000 2nd edition at www.tenspros.com or www.amazon.com  (It should be less than $30)     TENS unit instructions:   Do not shower or bathe with the unit on  Turn the unit off before removing electrodes or batteries  If the electrodes lose stickiness add a drop of water to the electrodes after they are disconnected from the unit and place on plastic sheet. If you continued to have difficulty, call the TENS unit company to purchase more electrodes.  Do not apply lotion on the skin area prior to use. Make sure the skin is clean and dry as this will help prolong the life of the electrodes.  After use, always check skin for unusual red areas, rash or other skin difficulties. If there are any skin problems, does not apply electrodes to the same area.  Never remove the electrodes from the unit by pulling the wires.  Do not use the TENS unit or electrodes other than as directed.  Do not change electrode placement without consulting your therapist or physician.  Keep 2 fingers with between each electrode.   TENS stands for Transcutaneous Electrical Nerve Stimulation. In other words, electrical impulses are allowed to pass through the skin in order to excite a nerve.   Purpose and Use of TENS:  TENS is a method used to manage acute and chronic pain without the use of drugs. It has been effective in managing pain associated with surgery, sprains, strains, trauma, rheumatoid arthritis, and neuralgias. It is a non-addictive, low risk, and non-invasive technique used to control pain. It is not, by any means, a curative form of treatment.   How TENS Works:  Most TENS units are a Paramedic unit powered by one 9 volt battery. Attached to the outside of the unit are two lead wires where two pins and/or snaps connect on each wire. All units come with a set of four reusable pads or electrodes. These are placed  on the skin surrounding the area involved. By inserting the leads into  the pads, the electricity can pass from the unit making the circuit complete.  As the intensity is turned up slowly, the electrical current enters the body from the electrodes through the skin to the surrounding nerve fibers. This triggers the release of hormones from within the body. These hormones contain pain relievers. By increasing the circulation of these hormones, the persons pain may be lessened. It is also believed that the electrical stimulation itself helps to block the pain messages being sent to the brain, thus also decreasing the bodys perception of pain.   Hazards:  TENS units are NOT to be used by patients with PACEMAKERS, DEFIBRILLATORS, DIABETIC PUMPS, PREGNANT WOMEN, and patients with SEIZURE DISORDERS.  TENS units are NOT to be used over the heart, throat, brain, or spinal cord.  One of the major side effects from the TENS unit may be skin irritation. Some people may develop a rash if they are sensitive to the materials used in the electrodes or the connecting wires.     Avoid overuse due the body getting used to the stem making it not as effective over time.  Access Code: [email or web address] URL: [email or web address] Date: 11/03/2019 Prepared by: Ruben Im  Exercises Hooklying Transversus Abdominis Palpation - 1 x daily - 7 x weekly - 1 sets - 10 reps - 5 hold Seated Transversus Abdominis Bracing - 1 x  daily - 7 x weekly - 1 sets - 10 reps Standing Transverse Abdominis Contraction - 1 x daily - 7 x weekly - 1 sets - 10 reps Seated Table Piriformis Stretch - 1 x daily - 7 x weekly - 3 sets - 3 reps - 20 hold Seated Shoulder Flexion - 1 x daily - 7 x weekly - 3 sets - 5 reps Modified Thomas Stretch - 1 x daily - 7 x weekly - 1 sets - 3 reps - 20 hold Hip Flexor Stretch on Step - 1 x daily - 7 x weekly - 1 sets - 3 reps - 20 hold

## 2019-11-04 ENCOUNTER — Ambulatory Visit: Payer: Medicare Other | Admitting: Physical Therapy

## 2019-11-10 ENCOUNTER — Encounter: Payer: Medicare Other | Admitting: Physical Therapy

## 2019-11-11 ENCOUNTER — Encounter: Payer: Medicare Other | Admitting: Physical Therapy

## 2019-11-15 ENCOUNTER — Encounter: Payer: Self-pay | Admitting: Family Medicine

## 2019-11-15 ENCOUNTER — Other Ambulatory Visit: Payer: Self-pay | Admitting: Internal Medicine

## 2019-11-16 ENCOUNTER — Other Ambulatory Visit: Payer: Self-pay

## 2019-11-16 MED ORDER — HORIZANT 300 MG PO TBCR: 300.0000 mg | EXTENDED_RELEASE_TABLET | Freq: Every day | ORAL | 3 refills | Status: DC

## 2019-11-16 MED ORDER — IRBESARTAN 75 MG PO TABS: 75.0000 mg | ORAL_TABLET | Freq: Every day | ORAL | 0 refills | Status: DC

## 2019-11-16 NOTE — Progress Notes (Unsigned)
horizant

## 2019-11-17 ENCOUNTER — Ambulatory Visit: Payer: Medicare Other | Admitting: Physical Therapy

## 2019-11-18 ENCOUNTER — Ambulatory Visit: Payer: Medicare Other | Admitting: Physical Therapy

## 2019-11-21 ENCOUNTER — Ambulatory Visit: Payer: Medicare Other | Admitting: Internal Medicine

## 2019-11-24 ENCOUNTER — Ambulatory Visit: Payer: Medicare Other | Admitting: Physical Therapy

## 2019-11-25 ENCOUNTER — Ambulatory Visit: Payer: Medicare Other | Admitting: Physical Therapy

## 2019-12-12 ENCOUNTER — Other Ambulatory Visit: Payer: Self-pay

## 2019-12-12 MED ORDER — LEVOTHYROXINE SODIUM 200 MCG PO TABS: ORAL_TABLET | ORAL | 5 refills | Status: DC

## 2020-01-03 ENCOUNTER — Other Ambulatory Visit: Payer: Self-pay | Admitting: Internal Medicine

## 2020-01-26 ENCOUNTER — Ambulatory Visit (INDEPENDENT_AMBULATORY_CARE_PROVIDER_SITE_OTHER): Payer: Medicare Other

## 2020-01-26 DIAGNOSIS — Z0000 Encounter for general adult medical examination without abnormal findings: Secondary | ICD-10-CM | POA: Diagnosis not present

## 2020-01-26 NOTE — Progress Notes (Signed)
I connected with Autumn Massey today by telephone and verified that I am speaking with the correct person using two identifiers. Location patient: home Location provider: work Persons participating in the virtual visit: Conni Knighton and Lisette Abu, LPN   I discussed the limitations, risks, security and privacy concerns of performing an evaluation and management service by telephone and the availability of in person appointments. I also discussed with the patient that there may be a patient responsible charge related to this service. The patient expressed understanding and verbally consented to this telephonic visit.    Interactive audio and video telecommunications were attempted between this provider and patient, however failed, due to patient having technical difficulties OR patient did not have access to video capability.  We continued and completed visit with audio only.  Some vital signs may be absent or patient reported.   Time Spent with patient on telephone encounter: 30 minutes  Subjective:   Autumn Massey is a 54 y.o. female who presents for Medicare Annual (Subsequent) preventive examination.  Review of Systems    No ROS. Medicare Wellness Virtual Visit. Additional risk factors are reflected in social history. Cardiac Risk Factors include: advanced age (>26mn, >>60women);diabetes mellitus;dyslipidemia;family history of premature cardiovascular disease;hypertension;obesity (BMI >30kg/m2)     Objective:    Today's Vitals   01/26/20 1320  PainSc: 6    There is no height or weight on file to calculate BMI.  Advanced Directives 01/26/2020 10/20/2019 04/26/2019 01/17/2019 10/15/2016 10/14/2016 09/27/2016  Does Patient Have a Medical Advance Directive? No No No No No No No  Would patient like information on creating a medical advance directive? No - Patient declined - Yes (MAU/Ambulatory/Procedural Areas - Information given) Yes (MAU/Ambulatory/Procedural Areas -  Information given) No - Patient declined - No - Patient declined    Current Medications (verified) Outpatient Encounter Medications as of 01/26/2020  Medication Sig  . Accu-Chek FastClix Lancets MISC Use to check blood sugar once a day  . acetaminophen (TYLENOL) 500 MG tablet Take 1,000 mg by mouth every 6 (six) hours as needed for mild pain, moderate pain, fever or headache.   . albuterol (PROAIR HFA) 108 (90 Base) MCG/ACT inhaler 2 puffs every 4 hours as needed only  if your can't catch your breath (Patient taking differently: Inhale 2 puffs into the lungs every 4 (four) hours as needed for shortness of breath. 2 puffs every 4 hours as needed only  if your can't catch your breath)  . albuterol (PROVENTIL) (2.5 MG/3ML) 0.083% nebulizer solution Take 3 mLs (2.5 mg total) by nebulization every 6 (six) hours as needed for wheezing or shortness of breath.  .Marland Kitchenaspirin EC 81 MG tablet Take 81 mg by mouth daily.  . bisoprolol-hydrochlorothiazide (ZIAC) 5-6.25 MG tablet Take 1 tablet by mouth daily. Annual appt due in July must see provider for future refills  . Blood Glucose Monitoring Suppl (ACCU-CHEK AVIVA PLUS) w/Device KIT Use to check blood sugar once a day  . Cholecalciferol (VITAMIN D3) 125 MCG (5000 UT) CAPS Take 5,000 Units by mouth daily.  . Coenzyme Q10-Vitamin E (COQ10-VITAMIN E) 200-20 MG-UNIT CAPS Take 1 capsule by mouth daily.   . cyclobenzaprine (FLEXERIL) 5 MG tablet TAKE 1 TABLET BY MOUTH 3  TIMES DAILY AS NEEDED FOR  MUSCLE SPASM(S)  . Diclofenac Sodium (PENNSAID) 2 % SOLN Place 2 g onto the skin 2 (two) times daily.  .Marland KitchenFLUoxetine (PROZAC) 20 MG capsule TAKE 1 CAPSULE BY MOUTH EVERY DAY Annual appt due  in July must see provider for future refills  . fluticasone furoate-vilanterol (BREO ELLIPTA) 100-25 MCG/INH AEPB INHALE 1 PUFF INTO THE LUNGS EVERY MORNING.  . furosemide (LASIX) 40 MG tablet TAKE 1 TABLET BY MOUTH  EVERY OTHER DAY AS NEEDED. Annual appt due in July must see provider for  future refills  . Gabapentin Enacarbil ER (HORIZANT) 300 MG TBCR Take 300 mg by mouth at bedtime.  . Gabapentin Enacarbil ER (HORIZANT) 300 MG TBCR Take 300 mg by mouth at bedtime.  [name] GARLIC PO Take 1 tablet by mouth daily.  [name] glucose blood (ACCU-CHEK AVIVA) test strip Use to check blood sugar once a day - Aviva Plus  . ibuprofen (ADVIL) 200 MG tablet Take 3 tablets (600 mg total) by mouth every 6 (six) hours as needed.  . irbesartan (AVAPRO) 75 MG tablet Take 1 tablet (75 mg total) by mouth at bedtime. Annual appt due in July must see provider for future refills  . JARDIANCE 10 MG TABS tablet TAKE 1 TABLET BY MOUTH EVERY DAY  . Lancets Misc. (ACCU-CHEK FASTCLIX LANCET) KIT Use to check blood sugar once a day  . levothyroxine (SYNTHROID) 200 MCG tablet TAKE 1 TABLET BY MOUTH  DAILY..  . metFORMIN (GLUCOPHAGE-XR) 500 MG 24 hr tablet Take 1 tablet (500 mg total) by mouth daily with breakfast.  . montelukast (SINGULAIR) 10 MG tablet Take 1 tablet (10 mg total) by mouth at bedtime. Annual appt due in July must see provider for future refills  . oxyCODONE-acetaminophen (PERCOCET) 5-325 MG tablet Take 1 tablet by mouth every 6 (six) hours as needed for severe pain.  . OXYGEN 2lpm with sleep only  [facility]  . primidone (MYSOLINE) 50 MG tablet Take 1 tablet in morning and 3 tablets at bedtime  . simvastatin (ZOCOR) 40 MG tablet TAKE 1 TABLET BY MOUTH AT 6 PM Annual appt due in July must see provider for future refills  . spironolactone (ALDACTONE) 25 MG tablet TAKE 1 TABLET BY MOUTH TWICE A DAY  . Turmeric 500 MG CAPS Take 500 mg by mouth 2 (two) times daily.  . [DISCONTINUED] pregabalin (LYRICA) 50 MG capsule TAKE 1 CAPSULE BY MOUTH 3  TIMES DAILY   No facility-administered encounter medications on file as of 01/26/2020.    Allergies (verified) Cymbalta [duloxetine hcl]   History: Past Medical History:  Diagnosis Date  . Anxiety   . ANXIETY DEPRESSION 01/29/2009   04/26/2019- no current  . CHF  (congestive heart failure) ([place])   . DEPRESSION 11/19/2007  . Diabetes ([place])   . FREQUENCY, URINARY 10/17/2009  . HYPERLIPIDEMIA 11/19/2007  . HYPERTENSION 11/19/2007  . HYPOTHYROIDISM 11/19/2007  . Morbid obesity ([place])   . Neuropathy 01/26/2019  . Neuropathy   . PNEUMONIA 08/06/2010  . POLYCYTHEMIA 01/29/2009  . Preventative health care 04/06/2011  . Sleep apnea    mild sleep apnea, on o2 at 2l at nighttime  . SLEEP RELATED HYPOVENTILATION/HYPOXEMIA [facility] 02/08/2009  . Venous insufficiency 07/19/2012   Past Surgical History:  Procedure Laterality Date  . DILITATION & CURRETTAGE/HYSTROSCOPY WITH NOVASURE ABLATION N/A 04/28/2019   Procedure: DILATATION & CURETTAGE/HYSTEROSCOPY WITH NOVASURE ABLATION;  Surgeon: Molli Posey, MD;  Location: [facility];  Service: Gynecology;  Laterality: N/A;  [employer] rep will be here confirmed on 04/20/19  . TONSILLECTOMY    . ULNAR TUNNEL RELEASE Left 02/22/2013   Procedure: LEFT ULNAR NERVE DECOMPRESSION ;  Surgeon: Tennis Must, MD;  Location: [facility];  Service: Orthopedics;  Laterality: Left;  Family History  Problem Relation Age of Onset  . Cancer Mother        melanoma   . Cancer Maternal Grandfather        prostate cancer  . Alcohol abuse Other   . Arthritis Other   . Hypertension Other   . High Cholesterol Sister   . Psoriasis Brother   . Osteoarthritis Maternal Grandmother 99  . Scoliosis Maternal Grandmother    Social History   Socioeconomic History  . Marital status: Divorced    Spouse name: Not on file  . Number of children: 0  . Years of education: 36  . Highest education level: 12th grade  Occupational History  . Occupation: Disabled    Employer: [employer]  Tobacco Use  . Smoking status: Former Smoker    Packs/day: 0.50    Years: 22.00    Pack years: 11.00    Types: Cigarettes    Quit date: 05/22/2015    Years since quitting: 4.6  . Smokeless tobacco: Never Used  Vaping Use  . Vaping Use: Never used   Substance and Sexual Activity  . Alcohol use: Yes    Alcohol/week: 0.0 standard drinks    Comment: rare  . Drug use: No  . Sexual activity: Never    Birth control/protection: I.U.D.  Other Topics Concern  . Not on file  Social History Narrative   Patient is right-handed.   Social Determinants of Health   Financial Resource Strain:   . Difficulty of Paying Living Expenses:   Food Insecurity:   . Worried About Charity fundraiser in the Last Year:   . Arboriculturist in the Last Year:   Transportation Needs:   . Film/video editor (Medical):   [name] Lack of Transportation (Non-Medical):   Physical Activity:   . Days of Exercise per Week:   . Minutes of Exercise per Session:   Stress:   . Feeling of Stress :   Social Connections:   . Frequency of Communication with Friends and Family:   . Frequency of Social Gatherings with Friends and Family:   . Attends Religious Services:   . Active Member of Clubs or Organizations:   . Attends Archivist Meetings:   [name] Marital Status:     Tobacco Counseling Counseling given: Not Answered   Clinical Intake:  Pre-visit preparation completed: Yes  Pain : 0-10 Pain Score: 6  Pain Type: Chronic pain Pain Location: Back Pain Orientation: Lower Pain Radiating Towards: Right hip Pain Descriptors / Indicators: Constant Pain Onset: More than a month ago Pain Frequency: Constant     Nutritional Risks: None Diabetes: Yes CBG done?: No Did pt. bring in CBG monitor from home?: No  How often do you need to have someone help you when you read instructions, pamphlets, or other written materials from your doctor or pharmacy?: 1 - Never What is the last grade level you completed in school?: HSG  Diabetic? yes  Interpreter Needed?: No  Information entered by :: Amilya Haver N. Collyn Ribas, LPN   Activities of Daily Living In your present state of health, do you have any difficulty performing the following activities: 01/26/2020  04/26/2019  Hearing? N Y  Vision? N N  Difficulty concentrating or making decisions? N N  Walking or climbing stairs? N N  Dressing or bathing? N N  Doing errands, shopping? N N  Preparing Food and eating ? N -  Using the Toilet? N -  In the past  six months, have you accidently leaked urine? N -  Do you have problems with loss of bowel control? N -  Managing your Medications? N -  Managing your Finances? N -  Housekeeping or managing your Housekeeping? N -  Some recent data might be hidden    Patient Care Team: Biagio Borg, MD as PCP - General  Indicate any recent Medical Services you may have received from other than [facility] providers in the past year (date may be approximate).     Assessment:   This is a routine wellness examination for [place].  Hearing/Vision screen No exam data present  Dietary issues and exercise activities discussed: Current Exercise Habits: The patient does not participate in regular exercise at present, Exercise limited by: respiratory conditions(s);psychological condition(s);orthopedic condition(s)  Goals   None    Depression Screen PHQ 2/9 Scores 01/26/2020 01/26/2019 01/08/2016 11/22/2014 08/30/2014 08/11/2013  PHQ - 2 Score 1 0 1 0 1 1    Fall Risk Fall Risk  01/26/2020 10/20/2019 01/26/2019 04/15/2018 08/31/2017  Falls in the past year? 0 0 0 No No  Number falls in past yr: 0 0 - - -  Injury with Fall? 0 0 - - -  Risk for fall due to : Orthopedic patient - - - -  Follow up Falls evaluation completed - - - -    Any stairs in or around the home? No  If so, are there any without handrails? No  Home free of loose throw rugs in walkways, pet beds, electrical cords, etc? Yes  Adequate lighting in your home to reduce risk of falls? Yes   ASSISTIVE DEVICES UTILIZED TO PREVENT FALLS:  Life alert? No  Use of a cane, walker or w/c? Yes  Grab bars in the bathroom? Yes  Shower chair or bench in shower? Yes  Elevated toilet seat or a handicapped toilet?  No   TIMED UP AND GO:  Was the test performed? No .  Length of time to ambulate 10 feet: 0 sec.   Gait slow and steady with assistive device  Cognitive Function: not indicated.        Immunizations Immunization History  Administered Date(s) Administered  . Influenza Split 04/11/2011, 03/15/2012, 03/31/2015  . Influenza Whole 02/20/2010  . Influenza,inj,Quad PF,6+ Mos 08/11/2013, 03/07/2014, 03/10/2017, 03/02/2018  . Pneumococcal Conjugate-13 11/22/2014  . Pneumococcal Polysaccharide-23 11/04/2016  . Td 01/29/2009  . Tdap 01/26/2019    TDAP status: Up to date Flu Vaccine status: Up to date Pneumococcal vaccine status: Up to date Covid-19 vaccine status: Declined, Education has been provided regarding the importance of this vaccine but patient still declined. Advised may receive this vaccine at local pharmacy or Health Dept.or vaccine clinic. Aware to provide a copy of the vaccination record if obtained from local pharmacy or Health Dept. Verbalized acceptance and understanding.  Qualifies for Shingles Vaccine? Yes   Zostavax completed No   Shingrix Completed?: No.    Education has been provided regarding the importance of this vaccine. Patient has been advised to call insurance company to determine out of pocket expense if they have not yet received this vaccine. Advised may also receive vaccine at local pharmacy or Health Dept. Verbalized acceptance and understanding.  Screening Tests Health Maintenance  Topic Date Due  . Hepatitis C Screening  Never done  . COVID-19 Vaccine (1) Never done  . OPHTHALMOLOGY EXAM  01/27/2018  . FOOT EXAM  01/22/2019  . PAP SMEAR-Modifier  02/29/2020  . INFLUENZA VACCINE  01/29/2020  .  MAMMOGRAM  03/02/2020  . HEMOGLOBIN A1C  04/20/2020  . COLONOSCOPY  04/30/2020  . TETANUS/TDAP  01/25/2029  . PNEUMOCOCCAL POLYSACCHARIDE VACCINE AGE 55-64 HIGH RISK  Completed  . HIV Screening  Completed    Health Maintenance  Health Maintenance Due    Topic Date Due  . Hepatitis C Screening  Never done  . COVID-19 Vaccine (1) Never done  . OPHTHALMOLOGY EXAM  01/27/2018  . FOOT EXAM  01/22/2019  . PAP SMEAR-Modifier  02/29/2020    Colorectal cancer screening: Completed 04/2010. Repeat every 10 years Mammogram: Completed 03/02/2018; scheduled for October 2021 Bone Density: never scheduled  Lung Cancer Screening: (Low Dose CT Chest recommended if Age 68-80 years, 30 pack-year currently smoking OR have quit w/in 15years.) does not qualify.   Lung Cancer Screening Referral: no  Additional Screening:  Hepatitis C Screening: does qualify; Completed no  Vision Screening: Recommended annual ophthalmology exams for early detection of glaucoma and other disorders of the eye. Is the patient up to date with their annual eye exam?  Yes  Who is the provider or wh/at is the name of the office in which the patient attends annual eye exams? [facility], OD If pt is not established with a provider, would they like to be referred to a provider to establish care? No .   Dental Screening: Recommended annual dental exams for proper oral hygiene  Community Resource Referral / Chronic Care Management: CRR required this visit?  No   CCM required this visit?  No      Plan:     I have personally reviewed and noted the following in the patient's chart:   . Medical and social history . Use of alcohol, tobacco or illicit drugs  . Current medications and supplements . Functional ability and status . Nutritional status . Physical activity . Advanced directives . List of other physicians . Hospitalizations, surgeries, and ER visits in previous 12 months . Vitals . Screenings to include cognitive, depression, and falls . Referrals and appointments  In addition, I have reviewed and discussed with patient certain preventive protocols, quality metrics, and best practice recommendations. A written personalized care plan for preventive services as well as  general preventive health recommendations were provided to patient.     Sheral Flow, LPN   [id number]/98/[id number]   Nurse Notes:  Patient is cogitatively intact. There were no vitals filed for this visit. There is no height or weight on file to calculate BMI.

## 2020-01-26 NOTE — Patient Instructions (Addendum)
Autumn Massey , Thank you for taking time to come for your Medicare Wellness Visit. I appreciate your ongoing commitment to your health goals. Please review the following plan we discussed and let me know if I can assist you in the future.   Screening recommendations/referrals: Colonoscopy: never done  Mammogram: 03/02/2018 Bone Density: never done Recommended yearly ophthalmology/optometry visit for glaucoma screening and checkup Recommended yearly dental visit for hygiene and checkup  Vaccinations: Influenza vaccine: 03/02/2018 Pneumococcal vaccine: completed Tdap vaccine: 01/26/2019 Shingles vaccine: never done  Covid-19: never done  Advanced directives: Advance directive discussed with you today. Even though you declined this today please call our office should you change your mind and we can give you the proper paperwork for you to fill out.  Conditions/risks identified: Please continue to do your personal lifestyle choices by: daily care of teeth and gums, regular physical activity (goal should be 5 days a week for 30 minutes), eat a healthy diet, avoid tobacco and drug use, limiting any alcohol intake, taking a low-dose aspirin (if not allergic or have been advised by your provider otherwise) and taking vitamins and minerals as recommended by your provider. Continue doing brain stimulating activities (puzzles, reading, adult coloring books, staying active) to keep memory sharp. Continue to eat heart healthy diet (full of fruits, vegetables, whole grains, lean protein, water--limit salt, fat, and sugar intake) and increase physical activity as tolerated.   Next appointment: Please schedule your next Medicare Wellness Visit with your Nurse Health Advisor in 1 year.  Preventive Care 40-64 Years, Female Preventive care refers to lifestyle choices and visits with your health care provider that can promote health and wellness. What does preventive care include?  A yearly physical exam. This is  also called an annual well check.  Dental exams once or twice a year.  Routine eye exams. Ask your health care provider how often you should have your eyes checked.  Personal lifestyle choices, including:  Daily care of your teeth and gums.  Regular physical activity.  Eating a healthy diet.  Avoiding tobacco and drug use.  Limiting alcohol use.  Practicing safe sex.  Taking low-dose aspirin daily starting at age 41.  Taking vitamin and mineral supplements as recommended by your health care provider. What happens during an annual well check? The services and screenings done by your health care provider during your annual well check will depend on your age, overall health, lifestyle risk factors, and family history of disease. Counseling  Your health care provider may ask you questions about your:  Alcohol use.  Tobacco use.  Drug use.  Emotional well-being.  Home and relationship well-being.  Sexual activity.  Eating habits.  Work and work Statistician.  Method of birth control.  Menstrual cycle.  Pregnancy history. Screening  You may have the following tests or measurements:  Height, weight, and BMI.  Blood pressure.  Lipid and cholesterol levels. These may be checked every 5 years, or more frequently if you are over 62 years old.  Skin check.  Lung cancer screening. You may have this screening every year starting at age 3 if you have a 30-pack-year history of smoking and currently smoke or have quit within the past 15 years.  Fecal occult blood test (FOBT) of the stool. You may have this test every year starting at age 52.  Flexible sigmoidoscopy or colonoscopy. You may have a sigmoidoscopy every 5 years or a colonoscopy every 10 years starting at age 3.  Hepatitis C blood test.  Hepatitis B blood test.  Sexually transmitted disease (STD) testing.  Diabetes screening. This is done by checking your blood sugar (glucose) after you have not  eaten for a while (fasting). You may have this done every 1-3 years.  Mammogram. This may be done every 1-2 years. Talk to your health care provider about when you should start having regular mammograms. This may depend on whether you have a family history of breast cancer.  BRCA-related cancer screening. This may be done if you have a family history of breast, ovarian, tubal, or peritoneal cancers.  Pelvic exam and Pap test. This may be done every 3 years starting at age 59. Starting at age 38, this may be done every 5 years if you have a Pap test in combination with an HPV test.  Bone density scan. This is done to screen for osteoporosis. You may have this scan if you are at high risk for osteoporosis. Discuss your test results, treatment options, and if necessary, the need for more tests with your health care provider. Vaccines  Your health care provider may recommend certain vaccines, such as:  Influenza vaccine. This is recommended every year.  Tetanus, diphtheria, and acellular pertussis (Tdap, Td) vaccine. You may need a Td booster every 10 years.  Zoster vaccine. You may need this after age 64.  Pneumococcal 13-valent conjugate (PCV13) vaccine. You may need this if you have certain conditions and were not previously vaccinated.  Pneumococcal polysaccharide (PPSV23) vaccine. You may need one or two doses if you smoke cigarettes or if you have certain conditions. Talk to your health care provider about which screenings and vaccines you need and how often you need them. This information is not intended to replace advice given to you by your health care provider. Make sure you discuss any questions you have with your health care provider. Document Released: 07/13/2015 Document Revised: 03/05/2016 Document Reviewed: 04/17/2015 Elsevier Interactive Patient Education  2017 [place] Prevention in the Home Falls can cause injuries. They can happen to people of all ages.  There are many things you can do to make your home safe and to help prevent falls. What can I do on the outside of my home?  Regularly fix the edges of walkways and driveways and fix any cracks.  Remove anything that might make you trip as you walk through a door, such as a raised step or threshold.  Trim any bushes or trees on the path to your home.  Use bright outdoor lighting.  Clear any walking paths of anything that might make someone trip, such as rocks or tools.  Regularly check to see if handrails are loose or broken. Make sure that both sides of any steps have handrails.  Any raised decks and porches should have guardrails on the edges.  Have any leaves, snow, or ice cleared regularly.  Use sand or salt on walking paths during winter.  Clean up any spills in your garage right away. This includes oil or grease spills. What can I do in the bathroom?  Use night lights.  Install grab bars by the toilet and in the tub and shower. Do not use towel bars as grab bars.  Use non-skid mats or decals in the tub or shower.  If you need to sit down in the shower, use a plastic, non-slip stool.  Keep the floor dry. Clean up any water that spills on the floor as soon as it happens.  Remove soap buildup  in the tub or shower regularly.  Attach bath mats securely with double-sided non-slip rug tape.  Do not have throw rugs and other things on the floor that can make you trip. What can I do in the bedroom?  Use night lights.  Make sure that you have a light by your bed that is easy to reach.  Do not use any sheets or blankets that are too big for your bed. They should not hang down onto the floor.  Have a firm chair that has side arms. You can use this for support while you get dressed.  Do not have throw rugs and other things on the floor that can make you trip. What can I do in the kitchen?  Clean up any spills right away.  Avoid walking on wet floors.  Keep items that  you use a lot in easy-to-reach places.  If you need to reach something above you, use a strong step stool that has a grab bar.  Keep electrical cords out of the way.  Do not use floor polish or wax that makes floors slippery. If you must use wax, use non-skid floor wax.  Do not have throw rugs and other things on the floor that can make you trip. What can I do with my stairs?  Do not leave any items on the stairs.  Make sure that there are handrails on both sides of the stairs and use them. Fix handrails that are broken or loose. Make sure that handrails are as long as the stairways.  Check any carpeting to make sure that it is firmly attached to the stairs. Fix any carpet that is loose or worn.  Avoid having throw rugs at the top or bottom of the stairs. If you do have throw rugs, attach them to the floor with carpet tape.  Make sure that you have a light switch at the top of the stairs and the bottom of the stairs. If you do not have them, ask someone to add them for you. What else can I do to help prevent falls?  Wear shoes that:  Do not have high heels.  Have rubber bottoms.  Are comfortable and fit you well.  Are closed at the toe. Do not wear sandals.  If you use a stepladder:  Make sure that it is fully opened. Do not climb a closed stepladder.  Make sure that both sides of the stepladder are locked into place.  Ask someone to hold it for you, if possible.  Clearly mark and make sure that you can see:  Any grab bars or handrails.  First and last steps.  Where the edge of each step is.  Use tools that help you move around (mobility aids) if they are needed. These include:  Canes.  Walkers.  Scooters.  Crutches.  Turn on the lights when you go into a dark area. Replace any light bulbs as soon as they burn out.  Set up your furniture so you have a clear path. Avoid moving your furniture around.  If any of your floors are uneven, fix them.  If there  are any pets around you, be aware of where they are.  Review your medicines with your doctor. Some medicines can make you feel dizzy. This can increase your chance of falling. Ask your doctor what other things that you can do to help prevent falls. This information is not intended to replace advice given to you by your health care provider. Make  sure you discuss any questions you have with your health care provider. Document Released: 04/12/2009 Document Revised: 11/22/2015 Document Reviewed: 07/21/2014 Elsevier Interactive Patient Education  2017 [employer].

## 2020-01-27 ENCOUNTER — Ambulatory Visit (INDEPENDENT_AMBULATORY_CARE_PROVIDER_SITE_OTHER): Payer: Medicare Other | Admitting: Internal Medicine

## 2020-01-27 ENCOUNTER — Encounter: Payer: Self-pay | Admitting: Internal Medicine

## 2020-01-27 ENCOUNTER — Other Ambulatory Visit: Payer: Self-pay

## 2020-01-27 VITALS — BP 130/80 | HR 75 | Temp 98.4°F | Ht 61.0 in | Wt 334.0 lb

## 2020-01-27 DIAGNOSIS — M533 Sacrococcygeal disorders, not elsewhere classified: Secondary | ICD-10-CM | POA: Diagnosis not present

## 2020-01-27 DIAGNOSIS — E039 Hypothyroidism, unspecified: Secondary | ICD-10-CM

## 2020-01-27 DIAGNOSIS — F4321 Adjustment disorder with depressed mood: Secondary | ICD-10-CM | POA: Diagnosis not present

## 2020-01-27 DIAGNOSIS — E1165 Type 2 diabetes mellitus with hyperglycemia: Secondary | ICD-10-CM

## 2020-01-27 DIAGNOSIS — Z0001 Encounter for general adult medical examination with abnormal findings: Secondary | ICD-10-CM | POA: Diagnosis not present

## 2020-01-27 DIAGNOSIS — Z1159 Encounter for screening for other viral diseases: Secondary | ICD-10-CM | POA: Diagnosis not present

## 2020-01-27 DIAGNOSIS — G8929 Other chronic pain: Secondary | ICD-10-CM

## 2020-01-27 DIAGNOSIS — E559 Vitamin D deficiency, unspecified: Secondary | ICD-10-CM

## 2020-01-27 DIAGNOSIS — J449 Chronic obstructive pulmonary disease, unspecified: Secondary | ICD-10-CM

## 2020-01-27 DIAGNOSIS — E785 Hyperlipidemia, unspecified: Secondary | ICD-10-CM

## 2020-01-27 DIAGNOSIS — I10 Essential (primary) hypertension: Secondary | ICD-10-CM | POA: Diagnosis not present

## 2020-01-27 DIAGNOSIS — Z0000 Encounter for general adult medical examination without abnormal findings: Secondary | ICD-10-CM | POA: Diagnosis not present

## 2020-01-27 MED ORDER — BISOPROLOL-HYDROCHLOROTHIAZIDE 5-6.25 MG PO TABS: 1.0000 | ORAL_TABLET | Freq: Every day | ORAL | 3 refills | Status: DC

## 2020-01-27 MED ORDER — CLONAZEPAM 0.5 MG PO TABS: 0.5000 mg | ORAL_TABLET | Freq: Two times a day (BID) | ORAL | 5 refills | Status: DC | PRN

## 2020-01-27 MED ORDER — ALBUTEROL SULFATE HFA 108 (90 BASE) MCG/ACT IN AERS: INHALATION_SPRAY | RESPIRATORY_TRACT | 3 refills | Status: DC

## 2020-01-27 MED ORDER — CYCLOBENZAPRINE HCL 5 MG PO TABS: ORAL_TABLET | ORAL | 1 refills | Status: AC

## 2020-01-27 MED ORDER — FLUOXETINE HCL 40 MG PO CAPS
40.0000 mg | ORAL_CAPSULE | Freq: Every day | ORAL | 3 refills | Status: DC
Start: 2020-01-27 — End: 2021-03-02

## 2020-01-27 MED ORDER — IRBESARTAN 75 MG PO TABS: 75.0000 mg | ORAL_TABLET | Freq: Every day | ORAL | 3 refills | Status: DC

## 2020-01-27 MED ORDER — MONTELUKAST SODIUM 10 MG PO TABS: 10.0000 mg | ORAL_TABLET | Freq: Every day | ORAL | 3 refills | Status: DC

## 2020-01-27 MED ORDER — SIMVASTATIN 40 MG PO TABS: ORAL_TABLET | ORAL | 3 refills | Status: DC

## 2020-01-27 MED ORDER — ALBUTEROL SULFATE (2.5 MG/3ML) 0.083% IN NEBU: 2.5000 mg | INHALATION_SOLUTION | Freq: Four times a day (QID) | RESPIRATORY_TRACT | 3 refills | Status: DC | PRN

## 2020-01-27 MED ORDER — FUROSEMIDE 40 MG PO TABS: ORAL_TABLET | ORAL | 3 refills | Status: DC

## 2020-01-27 MED ORDER — BREO ELLIPTA 100-25 MCG/INH IN AEPB: INHALATION_SPRAY | RESPIRATORY_TRACT | 3 refills | Status: DC

## 2020-01-27 NOTE — Patient Instructions (Signed)
Ok to incresae the prozac to 40 mg per day  Please continue all other medications as before, and refills have been done if requested, including the klonopin  Please call if you change your mind about referral for counseling  Please have the pharmacy call with any other refills you may need.  Please continue your efforts at being more active, low cholesterol diet, and weight control.  You are otherwise up to date with prevention measures today.  Please keep your appointments with your specialists as you may have planned - Dr Tamala Julian for the right hip pain  Please go to the LAB at the blood drawing area for the tests to be done  You will be contacted by phone if any changes need to be made immediately.  Otherwise, you will receive a letter about your results with an explanation, but please check with MyChart first.  Please remember to sign up for MyChart if you have not done so, as this will be important to you in the future with finding out test results, communicating by private email, and scheduling acute appointments online when needed.  Please make an Appointment to return in 6 months, or sooner if needed

## 2020-01-27 NOTE — Progress Notes (Signed)
Subjective:    Patient ID: Autumn Massey, female    DOB: 1965/07/10, 54 y.o.   MRN: [id number]  HPI  Here for wellness and f/u;  Overall doing ok;  Pt denies Chest pain, worsening SOB, DOE, wheezing, orthopnea, PND, worsening LE edema, palpitations, dizziness or syncope.  Pt denies neurological change such as new headache, facial or extremity weakness.  Pt denies polydipsia, polyuria, or low sugar symptoms. Pt states overall good compliance with treatment and medications, good tolerability, and has been trying to follow appropriate diet. No fever, night sweats, wt loss, loss of appetite, or other constitutional symptoms.  Pt states good ability with ADL's, has low fall risk, home safety reviewed and adequate, no other significant changes in hearing or vision. Has been more depressed lately, gma (for whom she was the primary caretaker for almost 10 yrs) fell and broke her arm, she was 89 and non surgical candidate, died in [place] since 11-26-19, now with worsening low mood , now not sure what to do with herself as she is divorced, no children, some days just does not get OOB>  Does have lunch once weekly with friendsbut only lasts a few hours  Has gained another 10 lbs, makes her more depressed even more.  Has ongoing right hipo pain, followed per Dr Tamala Julian with needling, and can no longer afford aquatic and other tx, as her gma money was helping pay part of her bills as well, as she is also disabled.  Denies hyper or hypo thyroid symptoms such as voice, skin or hair change - sees endo Past Medical History:  Diagnosis Date  . Anxiety   . ANXIETY DEPRESSION 01/29/2009   04/26/2019- no current  . CHF (congestive heart failure) ([place])   . DEPRESSION 11/19/2007  . Diabetes ([place])   . FREQUENCY, URINARY 10/17/2009  . HYPERLIPIDEMIA 11/19/2007  . HYPERTENSION 11/19/2007  . HYPOTHYROIDISM 11/19/2007  . Morbid obesity ([place])   . Neuropathy 01/26/2019  . Neuropathy   . PNEUMONIA 08/06/2010  . POLYCYTHEMIA  01/29/2009  . Preventative health care 04/06/2011  . Sleep apnea    mild sleep apnea, on o2 at 2l at nighttime  . SLEEP RELATED HYPOVENTILATION/HYPOXEMIA CCE 02/08/2009  . Venous insufficiency 07/19/2012   Past Surgical History:  Procedure Laterality Date  . DILITATION & CURRETTAGE/HYSTROSCOPY WITH NOVASURE ABLATION N/A 04/28/2019   Procedure: DILATATION & CURETTAGE/HYSTEROSCOPY WITH NOVASURE ABLATION;  Surgeon: Molli Posey, MD;  Location: [facility];  Service: Gynecology;  Laterality: N/A;  [employer] rep will be here confirmed on 04/20/19  . TONSILLECTOMY    . ULNAR TUNNEL RELEASE Left 02/22/2013   Procedure: LEFT ULNAR NERVE DECOMPRESSION ;  Surgeon: Tennis Must, MD;  Location: [facility];  Service: Orthopedics;  Laterality: Left;    reports that she quit smoking about 4 years ago. Her smoking use included cigarettes. She has a 11.00 pack-year smoking history. She has never used smokeless tobacco. She reports current alcohol use. She reports that she does not use drugs. family history includes Alcohol abuse in an other family member; Arthritis in an other family member; Cancer in her maternal grandfather and mother; High Cholesterol in her sister; Hypertension in an other family member; Osteoarthritis (age of onset: 55) in her maternal grandmother; Psoriasis in her brother; Scoliosis in her maternal grandmother. Allergies  Allergen Reactions  . Cymbalta [Duloxetine Hcl]    Current Outpatient Medications on File Prior to Visit  Medication Sig Dispense Refill  . Accu-Chek [employer]  Lancets MISC Use to check blood sugar once a day 100 each 3  . acetaminophen (TYLENOL) 500 MG tablet Take 1,000 mg by mouth every 6 (six) hours as needed for mild pain, moderate pain, fever or headache.     [name] aspirin EC 81 MG tablet Take 81 mg by mouth daily.    . Blood Glucose Monitoring Suppl (ACCU-CHEK AVIVA PLUS) w/Device KIT Use to check blood sugar once a day 1 kit 0  . Cholecalciferol (VITAMIN  D3) 125 MCG (5000 UT) CAPS Take 5,000 Units by mouth daily.    . Coenzyme Q10-Vitamin E (COQ10-VITAMIN E) 200-20 MG-UNIT CAPS Take 1 capsule by mouth daily.     . Diclofenac Sodium (PENNSAID) 2 % SOLN Place 2 g onto the skin 2 (two) times daily. 112 g 3  . Gabapentin Enacarbil ER (HORIZANT) 300 MG TBCR Take 300 mg by mouth at bedtime. 90 tablet 3  . GARLIC PO Take 1 tablet by mouth daily.    [name] glucose blood (ACCU-CHEK AVIVA) test strip Use to check blood sugar once a day - Aviva Plus 100 each 3  . JARDIANCE 10 MG TABS tablet TAKE 1 TABLET BY MOUTH EVERY DAY 30 tablet 11  . Lancets Misc. (ACCU-CHEK FASTCLIX LANCET) KIT Use to check blood sugar once a day 1 kit 0  . levothyroxine (SYNTHROID) 200 MCG tablet TAKE 1 TABLET BY MOUTH  DAILY.. 60 tablet 5  . metFORMIN (GLUCOPHAGE-XR) 500 MG 24 hr tablet Take 1 tablet (500 mg total) by mouth daily with breakfast. 90 tablet 3  . OXYGEN 2lpm with sleep only  [facility]    . primidone (MYSOLINE) 50 MG tablet Take 1 tablet in morning and 3 tablets at bedtime 360 tablet 3  . spironolactone (ALDACTONE) 25 MG tablet TAKE 1 TABLET BY MOUTH TWICE A DAY 180 tablet 0   No current facility-administered medications on file prior to visit.   Review of Systems All otherwise neg per pt    Objective:   Physical Exam BP (!) 130/80 (BP Location: Left Arm, Patient Position: Sitting, Cuff Size: Large)   Pulse 75   Temp 98.4 F (36.9 C) (Oral)   Ht _0  (1.549 m)   Wt (!) 334 lb (151.5 kg)   SpO2 93%   BMI 63.11 kg/m  VS noted, super morbid obese Constitutional: Pt appears in NAD HENT: Head: NCAT.  Right Ear: External ear normal.  Left Ear: External ear normal.  Eyes: . Pupils are equal, round, and reactive to light. Conjunctivae and EOM are normal Nose: without d/c or deformity Neck: Neck supple. Gross normal ROM Cardiovascular: Normal rate and regular rhythm.   Pulmonary/Chest: Effort normal and breath sounds without rales or wheezing.  Abd:  Soft, NT, ND, +  BS, no organomegaly Neurological: Pt is alert. At baseline orientation, motor grossly intact Skin: Skin is warm. No rashes, other new lesions, no LE edema Psychiatric: Pt behavior is normal without agitation , 2-3+ nervous All otherwise neg per pt Lab Results  Component Value Date   WBC 10.4 01/27/2020   HGB 16.5 (H) 01/27/2020   HCT 51.3 (H) 01/27/2020   PLT 204 01/27/2020   GLUCOSE 191 (H) 04/26/2019   CHOL 195 01/27/2020   TRIG 227 (H) 01/27/2020   HDL 52 01/27/2020   LDLDIRECT 97.0 01/26/2019   LDLCALC 109 (H) 01/27/2020   ALT 17 01/27/2020   AST 20 01/27/2020   NA 137 04/26/2019   K 4.4 04/26/2019   CL 96 (L) 04/26/2019  CREATININE 0.66 04/26/2019   BUN 15 04/26/2019   CO2 29 04/26/2019   TSH 71.60 (H) 01/27/2020   HGBA1C 7.6 (A) 10/20/2019   MICROALBUR 2.2 01/27/2020      Assessment & Plan:

## 2020-01-28 LAB — CBC WITH DIFFERENTIAL/PLATELET
Absolute Monocytes: 905 cells/uL (ref 200–950)
Basophils Absolute: 73 cells/uL (ref 0–200)
Basophils Relative: 0.7 %
Eosinophils Absolute: 312 cells/uL (ref 15–500)
Eosinophils Relative: 3 %
HCT: 51.3 % — ABNORMAL HIGH (ref 35.0–45.0)
Hemoglobin: 16.5 g/dL — ABNORMAL HIGH (ref 11.7–15.5)
Lymphs Abs: 2590 cells/uL (ref 850–3900)
MCH: 30.5 pg (ref 27.0–33.0)
MCHC: 32.2 g/dL (ref 32.0–36.0)
MCV: 94.8 fL (ref 80.0–100.0)
MPV: 12.8 fL — ABNORMAL HIGH (ref 7.5–12.5)
Monocytes Relative: 8.7 %
Neutro Abs: 6521 cells/uL (ref 1500–7800)
Neutrophils Relative %: 62.7 %
Platelets: 204 10*3/uL (ref 140–400)
RBC: 5.41 10*6/uL — ABNORMAL HIGH (ref 3.80–5.10)
RDW: 13 % (ref 11.0–15.0)
Total Lymphocyte: 24.9 %
WBC: 10.4 10*3/uL (ref 3.8–10.8)

## 2020-01-28 LAB — MICROALBUMIN / CREATININE URINE RATIO
Creatinine, Urine: 72 mg/dL (ref 20–275)
Microalb Creat Ratio: 31 mcg/mg creat — ABNORMAL HIGH (ref <30)
Microalb, Ur: 2.2 mg/dL

## 2020-01-28 LAB — URINALYSIS, ROUTINE W REFLEX MICROSCOPIC
Bilirubin Urine: NEGATIVE
Hgb urine dipstick: NEGATIVE
Hyaline Cast: NONE SEEN /LPF
Ketones, ur: NEGATIVE
Nitrite: POSITIVE — AB
Protein, ur: NEGATIVE
RBC / HPF: NONE SEEN /HPF (ref 0–2)
Specific Gravity, Urine: 1.027 (ref 1.001–1.03)
WBC, UA: >=60 /HPF — AB (ref 0–5)
pH: 6 (ref 5.0–8.0)

## 2020-01-28 LAB — LIPID PANEL
Cholesterol: 195 mg/dL (ref <200)
HDL: 52 mg/dL (ref >=50)
LDL Cholesterol (Calc): 109 mg/dL (calc) — ABNORMAL HIGH
Non-HDL Cholesterol (Calc): 143 mg/dL (calc) — ABNORMAL HIGH (ref <130)
Total CHOL/HDL Ratio: 3.8 (calc) (ref <5.0)
Triglycerides: 227 mg/dL — ABNORMAL HIGH (ref <150)

## 2020-01-28 LAB — VITAMIN D 25 HYDROXY (VIT D DEFICIENCY, FRACTURES): Vit D, 25-Hydroxy: 27 ng/mL — ABNORMAL LOW (ref 30–100)

## 2020-01-28 LAB — HEPATIC FUNCTION PANEL
AG Ratio: 1.4 (calc) (ref 1.0–2.5)
ALT: 17 U/L (ref 6–29)
AST: 20 U/L (ref 10–35)
Albumin: 4.4 g/dL (ref 3.6–5.1)
Alkaline phosphatase (APISO): 62 U/L (ref 37–153)
Bilirubin, Direct: 0.1 mg/dL (ref 0.0–0.2)
Globulin: 3.2 g/dL (calc) (ref 1.9–3.7)
Indirect Bilirubin: 0.4 mg/dL (calc) (ref 0.2–1.2)
Total Bilirubin: 0.5 mg/dL (ref 0.2–1.2)
Total Protein: 7.6 g/dL (ref 6.1–8.1)

## 2020-01-28 LAB — TSH: TSH: 71.6 mIU/L — ABNORMAL HIGH

## 2020-01-29 ENCOUNTER — Other Ambulatory Visit: Payer: Self-pay | Admitting: Internal Medicine

## 2020-01-29 ENCOUNTER — Encounter: Payer: Self-pay | Admitting: Internal Medicine

## 2020-01-29 DIAGNOSIS — E559 Vitamin D deficiency, unspecified: Secondary | ICD-10-CM | POA: Insufficient documentation

## 2020-01-29 MED ORDER — VITAMIN D (ERGOCALCIFEROL) 1.25 MG (50000 UNIT) PO CAPS: 50000.0000 [IU] | ORAL_CAPSULE | ORAL | 0 refills | Status: DC

## 2020-01-29 MED ORDER — CIPROFLOXACIN HCL 500 MG PO TABS
500.0000 mg | ORAL_TABLET | Freq: Two times a day (BID) | ORAL | 0 refills | Status: DC
Start: 2020-01-29 — End: 2020-02-08

## 2020-01-29 NOTE — Assessment & Plan Note (Signed)
Cont oral replacement 

## 2020-01-29 NOTE — Assessment & Plan Note (Signed)
Will need tfts with labs, needs to continue recent thyroid med refill

## 2020-01-29 NOTE — Assessment & Plan Note (Signed)
Still activity limiting with increased anxiety and depression in addition to recent grief, cont f/u with sport med

## 2020-01-29 NOTE — Assessment & Plan Note (Signed)
stable overall by history and exam, recent data reviewed with pt, and pt to continue medical treatment as before,  to f/u any worsening symptoms or concerns  

## 2020-01-29 NOTE — Assessment & Plan Note (Signed)

## 2020-01-29 NOTE — Assessment & Plan Note (Addendum)
Quite severe, declines counseling due to cost, for increase prozac 40qd  Cont klonopin  I spent 41 minutes in addition to time for CPX wellness examination in preparing to see the patient by review of recent labs, imaging and procedures, obtaining and reviewing separately obtained history, communicating with the patient and family or caregiver, ordering medications, tests or procedures, and documenting clinical information in the EHR including the differential Dx, treatment, and any further evaluation and other management of grief reaction, hypothyroidism, vit d deficiency, htn, chronic right sI pain, copd, dm, hld

## 2020-01-30 LAB — HEPATITIS C ANTIBODY
Hepatitis C Ab: NONREACTIVE
SIGNAL TO CUT-OFF: 0.03 (ref <1.00)

## 2020-02-08 ENCOUNTER — Telehealth (INDEPENDENT_AMBULATORY_CARE_PROVIDER_SITE_OTHER): Payer: Medicare Other | Admitting: Family

## 2020-02-08 DIAGNOSIS — J019 Acute sinusitis, unspecified: Secondary | ICD-10-CM

## 2020-02-08 MED ORDER — DOXYCYCLINE HYCLATE 100 MG PO TABS
100.0000 mg | ORAL_TABLET | Freq: Two times a day (BID) | ORAL | 0 refills | Status: DC
Start: 2020-02-08 — End: 2020-07-23

## 2020-02-08 MED ORDER — FLUCONAZOLE 150 MG PO TABS
ORAL_TABLET | ORAL | 0 refills | Status: DC
Start: 2020-02-08 — End: 2020-12-07

## 2020-02-08 NOTE — Progress Notes (Signed)
Autumn Massey is a 54 y.o. female with the following history as recorded in EpicCare:  Patient Active Problem List   Diagnosis Date Noted   Vitamin D deficiency 01/29/2020   Grief reaction 01/27/2020   Chronic right sacroiliac joint pain 09/05/2019   Degenerative arthritis of right knee 09/05/2019   Neuropathy 01/26/2019   Right hip pain 04/14/2018   COPD exacerbation ([place]) 04/16/2017   Metatarsal stress fracture of left foot 12/08/2016   Stress fracture of left foot 12/08/2016   Dysuria 11/04/2016   Low serum cortisol level (HCC) 10/21/2016   Leukocytosis 10/15/2016   Hyponatremia 10/14/2016   AKI (acute kidney injury) ([place] [place]) 10/14/2016   Nausea 10/09/2016   Leg cramps 10/09/2016   Community acquired pneumonia 09/28/2016   Chronic diastolic congestive heart failure ([employer] [place] [employer]) 09/28/2016   Asthma exacerbation 12/20/2015   Type 2 diabetes mellitus with hyperglycemia, without long-term current use of insulin ([place]) 11/19/2015   Dyspnea 10/09/2015   Chronic respiratory failure with hypoxia and hypercapnia ([place]) 07/19/2015   COPD GOLD 0 / AB 07/18/2015   Vaginitis 05/31/2015   Thoracic outlet syndrome 01/29/2015   Ulnar neuropathy at elbow of right upper extremity 12/19/2014   Benign essential tremor 10/13/2014   Movement disorder 08/30/2014   Cough 08/30/2014   Rash and nonspecific skin eruption 03/07/2014   Skin lesion of back 03/07/2014   PCOS (polycystic ovarian syndrome) 10/31/2013   History of CVA (cerebrovascular accident) 08/11/2013   Asthmatic bronchitis 06/10/2013   Acute meniscal tear of right knee 06/06/2013   Sprain of MCL (medial collateral ligament) of knee 05/03/2013   Left arm pain 02/09/2013   Hypoxemia 07/19/2012   Venous insufficiency 07/19/2012   Morbid obesity due to excess calories ([place]) 07/19/2012   Former smoker 04/11/2011   Encounter for well adult exam with abnormal findings 04/06/2011   SLEEP RELATED  HYPOVENTILATION/HYPOXEMIA CCE 02/08/2009   POLYCYTHEMIA 01/29/2009   ANXIETY DEPRESSION 01/29/2009   Hypothyroidism 11/19/2007   HLD (hyperlipidemia) 11/19/2007   DEPRESSION 11/19/2007   Essential hypertension 11/19/2007    Current Outpatient Medications  Medication Sig Dispense Refill   Accu-Chek FastClix Lancets MISC Use to check blood sugar once a day 100 each 3   acetaminophen (TYLENOL) 500 MG tablet Take 1,000 mg by mouth every 6 (six) hours as needed for mild pain, moderate pain, fever or headache.      albuterol (PROAIR HFA) 108 (90 Base) MCG/ACT inhaler 2 puffs every 6 hours as needed 54 g 3   albuterol (PROVENTIL) (2.5 MG/3ML) 0.083% nebulizer solution Take 3 mLs (2.5 mg total) by nebulization every 6 (six) hours as needed for wheezing or shortness of breath. 75 mL 3   aspirin EC 81 MG tablet Take 81 mg by mouth daily.     bisoprolol-hydrochlorothiazide (ZIAC) 5-6.25 MG tablet Take 1 tablet by mouth daily. 90 tablet 3   Blood Glucose Monitoring Suppl (ACCU-CHEK AVIVA PLUS) w/Device KIT Use to check blood sugar once a day 1 kit 0   Cholecalciferol (VITAMIN D3) 125 MCG (5000 UT) CAPS Take 5,000 Units by mouth daily.     clonazePAM (KLONOPIN) 0.5 MG tablet Take 1 tablet (0.5 mg total) by mouth 2 (two) times daily as needed for anxiety. 60 tablet 5   Coenzyme Q10-Vitamin E (COQ10-VITAMIN E) 200-20 MG-UNIT CAPS Take 1 capsule by mouth daily.      cyclobenzaprine (FLEXERIL) 5 MG tablet TAKE 1 TABLET BY MOUTH 3  TIMES DAILY AS NEEDED FOR  MUSCLE SPASM(S) 270 tablet 1  Diclofenac Sodium (PENNSAID) 2 % SOLN Place 2 g onto the skin 2 (two) times daily. 112 g 3   doxycycline (VIBRA-TABS) 100 MG tablet Take 1 tablet (100 mg total) by mouth 2 (two) times daily. 20 tablet 0   fluconazole (DIFLUCAN) 150 MG tablet Take 1 tablet today as directed; repeat after 72 hours 2 tablet 0   FLUoxetine (PROZAC) 40 MG capsule Take 1 capsule (40 mg total) by mouth daily. 90 capsule 3    fluticasone furoate-vilanterol (BREO ELLIPTA) 100-25 MCG/INH AEPB INHALE 1 PUFF INTO THE LUNGS EVERY MORNING. 90 each 3   furosemide (LASIX) 40 MG tablet TAKE 1 TABLET BY MOUTH  EVERY OTHER DAY AS NEEDED 90 tablet 3   Gabapentin Enacarbil ER (HORIZANT) 300 MG TBCR Take 300 mg by mouth at bedtime. 90 tablet 3   GARLIC PO Take 1 tablet by mouth daily.     glucose blood (ACCU-CHEK AVIVA) test strip Use to check blood sugar once a day - Aviva Plus 100 each 3   irbesartan (AVAPRO) 75 MG tablet Take 1 tablet (75 mg total) by mouth at bedtime. 90 tablet 3   JARDIANCE 10 MG TABS tablet TAKE 1 TABLET BY MOUTH EVERY DAY 30 tablet 11   Lancets Misc. (ACCU-CHEK FASTCLIX LANCET) KIT Use to check blood sugar once a day 1 kit 0   levothyroxine (SYNTHROID) 200 MCG tablet TAKE 1 TABLET BY MOUTH  DAILY.. 60 tablet 5   metFORMIN (GLUCOPHAGE-XR) 500 MG 24 hr tablet Take 1 tablet (500 mg total) by mouth daily with breakfast. 90 tablet 3   montelukast (SINGULAIR) 10 MG tablet Take 1 tablet (10 mg total) by mouth at bedtime. 90 tablet 3   OXYGEN 2lpm with sleep only  [facility]     primidone (MYSOLINE) 50 MG tablet Take 1 tablet in morning and 3 tablets at bedtime 360 tablet 3   simvastatin (ZOCOR) 40 MG tablet TAKE 1 TABLET BY MOUTH AT bedtime 90 tablet 3   spironolactone (ALDACTONE) 25 MG tablet TAKE 1 TABLET BY MOUTH TWICE A DAY 180 tablet 0   Vitamin D, Ergocalciferol, (DRISDOL) 1.25 MG (50000 UNIT) CAPS capsule Take 1 capsule (50,000 Units total) by mouth every 7 (seven) days. 12 capsule 0   No current facility-administered medications for this visit.    Allergies: Cymbalta [duloxetine hcl]  Past Medical History:  Diagnosis Date   Anxiety    ANXIETY DEPRESSION 01/29/2009   04/26/2019- no current   CHF (congestive heart failure) ([place])    DEPRESSION 11/19/2007   Diabetes ([place])    FREQUENCY, URINARY 10/17/2009   HYPERLIPIDEMIA 11/19/2007   HYPERTENSION 11/19/2007   HYPOTHYROIDISM 11/19/2007    Morbid obesity ([place])    Neuropathy 01/26/2019   Neuropathy    PNEUMONIA 08/06/2010   POLYCYTHEMIA 01/29/2009   Preventative health care 04/06/2011   Sleep apnea    mild sleep apnea, on o2 at 2l at nighttime   [place] CCE 02/08/2009   Venous insufficiency 07/19/2012    Past Surgical History:  Procedure Laterality Date   DILITATION & CURRETTAGE/HYSTROSCOPY WITH NOVASURE ABLATION N/A 04/28/2019   Procedure: DILATATION & CURETTAGE/HYSTEROSCOPY WITH NOVASURE ABLATION;  Surgeon: Molli Posey, MD;  Location: [facility];  Service: Gynecology;  Laterality: N/A;  [employer] rep will be here confirmed on 04/20/19   TONSILLECTOMY     ULNAR TUNNEL RELEASE Left 02/22/2013   Procedure: LEFT ULNAR NERVE DECOMPRESSION ;  Surgeon: Tennis Must, MD;  Location: [facility];  Service: Orthopedics;  Laterality: Left;    Family History  Problem Relation Age of Onset   Cancer Mother        melanoma    Cancer Maternal Grandfather        prostate cancer   Alcohol abuse Other    Arthritis Other    Hypertension Other    High Cholesterol Sister    Psoriasis Brother    Osteoarthritis Maternal Grandmother 99   Scoliosis Maternal Grandmother     Social History   Tobacco Use   Smoking status: Former Smoker    Packs/day: 0.50    Years: 22.00    Pack years: 11.00    Types: Cigarettes    Quit date: 05/22/2015    Years since quitting: 4.7   Smokeless tobacco: Never Used  Substance Use Topics   Alcohol use: Yes    Alcohol/week: 0.0 standard drinks    Comment: rare    Subjective:   I connected with [name] on 02/08/20 at 12:20 PM EDT by a telephone call and verified that I am speaking with the correct person using two identifiers.   I discussed the limitations of evaluation and management by telemedicine and the availability of in person appointments. The patient expressed understanding and agreed to proceed. Provider in office/ patient  is at home; provider and patient are only 2 people on phone call.   Concerned for possible sinus infection; symptoms x 5 days; + sore throat/ drainage; is not vaccinated for COVID; no fever or shortness of breath; using saline spray and Mucinex DM; taking her BREO as prescribed;  Objective:  There were no vitals filed for this visit.  Lungs: Respirations unlabored;  Neurologic: Alert and oriented; speech intact;   Assessment:  1. Acute sinusitis, recurrence not specified, unspecified location     Plan:  Rx for Doxycycline 100 mg bid x 10 days; encouraged to consider getting COVID tested if symptoms not improving within 24-48 hours; increase fluids, rest and follow-up worse, no better.  Time spent 8 minutes  No follow-ups on file.  No orders of the defined types were placed in this encounter.   Requested Prescriptions   Signed Prescriptions Disp Refills   doxycycline (VIBRA-TABS) 100 MG tablet 20 tablet 0    Sig: Take 1 tablet (100 mg total) by mouth 2 (two) times daily.   fluconazole (DIFLUCAN) 150 MG tablet 2 tablet 0    Sig: Take 1 tablet today as directed; repeat after 72 hours

## 2020-02-13 DIAGNOSIS — Z20822 Contact with and (suspected) exposure to covid-19: Secondary | ICD-10-CM | POA: Diagnosis not present

## 2020-02-22 ENCOUNTER — Ambulatory Visit: Payer: Medicare Other | Admitting: Family Medicine

## 2020-04-03 ENCOUNTER — Encounter: Payer: Self-pay | Admitting: Family

## 2020-04-06 DIAGNOSIS — H40033 Anatomical narrow angle, bilateral: Secondary | ICD-10-CM | POA: Diagnosis not present

## 2020-04-06 DIAGNOSIS — E119 Type 2 diabetes mellitus without complications: Secondary | ICD-10-CM | POA: Diagnosis not present

## 2020-04-17 ENCOUNTER — Other Ambulatory Visit: Payer: Self-pay | Admitting: Internal Medicine

## 2020-04-17 NOTE — Telephone Encounter (Signed)
Please change to OTC Vitamin D3 at 2000 units per day, indefinitely.  

## 2020-04-26 ENCOUNTER — Ambulatory Visit: Payer: Medicare Other | Admitting: Internal Medicine

## 2020-06-19 ENCOUNTER — Other Ambulatory Visit: Payer: Self-pay | Admitting: Internal Medicine

## 2020-07-22 NOTE — Progress Notes (Signed)
Virtual Visit via Video Note The purpose of this virtual visit is to provide medical care while limiting exposure to the novel coronavirus.    Consent was obtained for video visit:  Yes.   Answered questions that patient had about telehealth interaction:  Yes.   I discussed the limitations, risks, security and privacy concerns of performing an evaluation and management service by telemedicine. I also discussed with the patient that there may be a patient responsible charge related to this service. The patient expressed understanding and agreed to proceed.  Pt location: Home Physician Location: office Name of referring provider:  Biagio Borg, MD I connected with Autumn Massey at patients initiation/request on 07/23/2020 at  1:30 PM EST by video enabled telemedicine application and verified that I am speaking with the correct person using two identifiers. Pt MRN:  [id number] Pt DOB:  [birth year] Video Participants:  Autumn Massey   History of Present Illness:  Autumn Massey isa 55 year old right-handed woman with hypothyroidism, hyperlipidemia, diabetes, hypertension, hypoventilation, cerebrovascular disease and former smoker who follows up for essential tremor.  UPDATE: Current medication: primidone 36m in AM and 1525mat bedtime.  Other medications include Horizant 30042mt bedtime for neuropathic pain.  Tremors are more prominent in morning, making it difficult to hold her medications.  They improve later in the day.  Still with paresthesias and pain in the 4th and 5th digits of the left hand.     HISTORY: She underwent left ulnar decompression surgery in August 2014. When the cast was removed the following month, she noted twitching of her fifth digit. An MRI of the cervical spine was performed in December 2014, which revealed no structural etiology forradiculopathy. She had a NCV-EMG inJanuary 2015 which was essentially normal. She continued to have numbness,  paresthesias and weakness of the fourth and fifth digits of the left hand. She later developed tremor in both hands, more noticeable in the right hand since she is right-handed dominant. It occurs when she is writing or holding utensils. She denies gait difficulty, rigidity, lack of sense of smell, or history consistent with REM sleep behavior. She is concerned about Parkinson's disease because her maternal grandfather and uncle had it. Another uncle had tremor.  She has numbness and tingling in the toes and fingers. She also notes electric pain in the right leg below the knee to the foot. For neuropathic pain, she has tried gabapentin, Cymbalta, and Lyrica.   Past Medical History: Past Medical History:  Diagnosis Date  . Anxiety   . ANXIETY DEPRESSION 01/29/2009   04/26/2019- no current  . [facility] [place] heart failure) [place] . DEPRESSION 11/19/2007  . Diabetes ([facility] . FREQUENCY, URINARY 10/17/2009  . HYPERLIPIDEMIA 11/19/2007  . HYPERTENSION 11/19/2007  . HYPOTHYROIDISM 11/19/2007  . Morbid obesity ([facility] . Neuropathy 01/26/2019  . Neuropathy   . PNEUMONIA 08/06/2010  . POLYCYTHEMIA 01/29/2009  . Preventative health care 04/06/2011  . Sleep apnea    mild sleep apnea, on o2 at 2l at nighttime  . SLEEP RELATED HYPOVENTILATION/HYPOXEMIA CCE 02/08/2009  . Venous insufficiency 07/19/2012    Medications: Outpatient Encounter Medications as of 07/23/2020  Medication Sig  . Accu-Chek FastClix Lancets MISC Use to check blood sugar once a day  . albuterol (PROAIR HFA) 108 (90 Base) MCG/ACT inhaler 2 puffs every 6 hours as needed  . albuterol (PROVENTIL) (2.5 MG/3ML) 0.083% nebulizer solution Take 3 mLs (2.5 mg total) by nebulization every 6 (six) hours  as needed for wheezing or shortness of breath.  [name] aspirin EC 81 MG tablet Take 81 mg by mouth daily.  . bisoprolol-hydrochlorothiazide (ZIAC) 5-6.25 MG tablet Take 1 tablet by mouth daily.  . Blood Glucose Monitoring Suppl (ACCU-CHEK AVIVA  PLUS) w/Device KIT Use to check blood sugar once a day  . Cholecalciferol (VITAMIN D3) 125 MCG (5000 UT) CAPS Take 5,000 Units by mouth daily.  . clonazePAM (KLONOPIN) 0.5 MG tablet Take 1 tablet (0.5 mg total) by mouth 2 (two) times daily as needed for anxiety.  . Coenzyme Q10-Vitamin E (COQ10-VITAMIN E) 200-20 MG-UNIT CAPS Take 1 capsule by mouth daily.   . cyclobenzaprine (FLEXERIL) 5 MG tablet TAKE 1 TABLET BY MOUTH 3  TIMES DAILY AS NEEDED FOR  MUSCLE SPASM(S)  . Diclofenac Sodium (PENNSAID) 2 % SOLN Place 2 g onto the skin 2 (two) times daily.  . fluconazole (DIFLUCAN) 150 MG tablet Take 1 tablet today as directed; repeat after 72 hours  . FLUoxetine (PROZAC) 40 MG capsule Take 1 capsule (40 mg total) by mouth daily.  . fluticasone furoate-vilanterol (BREO ELLIPTA) 100-25 MCG/INH AEPB INHALE 1 PUFF INTO THE LUNGS EVERY MORNING.  . furosemide (LASIX) 40 MG tablet TAKE 1 TABLET BY MOUTH  EVERY OTHER DAY AS NEEDED  . Gabapentin Enacarbil ER (HORIZANT) 300 MG TBCR Take 300 mg by mouth at bedtime.  [name] GARLIC PO Take 1 tablet by mouth daily.  [name] glucose blood (ACCU-CHEK AVIVA) test strip Use to check blood sugar once a day - Aviva Plus  . irbesartan (AVAPRO) 75 MG tablet Take 1 tablet (75 mg total) by mouth at bedtime.  [name] JARDIANCE 10 MG TABS tablet TAKE 1 TABLET BY MOUTH EVERY DAY  . Lancets Misc. (ACCU-CHEK FASTCLIX LANCET) KIT Use to check blood sugar once a day  . levothyroxine (SYNTHROID) 200 MCG tablet TAKE 1 TABLET BY MOUTH  DAILY..  . metFORMIN (GLUCOPHAGE-XR) 500 MG 24 hr tablet Take 1 tablet (500 mg total) by mouth daily with breakfast.  . montelukast (SINGULAIR) 10 MG tablet Take 1 tablet (10 mg total) by mouth at bedtime.  . OXYGEN 2lpm with sleep only  [facility]  . primidone (MYSOLINE) 50 MG tablet Take 1 tablet in morning and 3 tablets at bedtime  . simvastatin (ZOCOR) 40 MG tablet TAKE 1 TABLET BY MOUTH AT bedtime  . spironolactone (ALDACTONE) 25 MG tablet TAKE 1 TABLET BY MOUTH  TWICE  DAILY  . acetaminophen (TYLENOL) 500 MG tablet Take 1,000 mg by mouth every 6 (six) hours as needed for mild pain, moderate pain, fever or headache.   . Vitamin D, Ergocalciferol, (DRISDOL) 1.25 MG (50000 UNIT) CAPS capsule Take 1 capsule (50,000 Units total) by mouth every 7 (seven) days. (Patient not taking: Reported on 07/23/2020)  . [DISCONTINUED] doxycycline (VIBRA-TABS) 100 MG tablet Take 1 tablet (100 mg total) by mouth 2 (two) times daily.   No facility-administered encounter medications on file as of 07/23/2020.    Allergies: Allergies  Allergen Reactions  . Cymbalta [Duloxetine Hcl]     Family History: Family History  Problem Relation Age of Onset  . Cancer Mother        melanoma   . Cancer Maternal Grandfather        prostate cancer  . Alcohol abuse Other   . Arthritis Other   . Hypertension Other   . High Cholesterol Sister   . Psoriasis Brother   . Osteoarthritis Maternal Grandmother 99  . Scoliosis Maternal Grandmother  Social History: Social History   Socioeconomic History  . Marital status: Divorced    Spouse name: Not on file  . Number of children: 0  . Years of education: 32  . Highest education level: 12th grade  Occupational History  . Occupation: Disabled    Employer: [employer]  Tobacco Use  . Smoking status: Former Smoker    Packs/day: 0.50    Years: 22.00    Pack years: 11.00    Types: Cigarettes    Quit date: 05/22/2015    Years since quitting: 5.1  . Smokeless tobacco: Never Used  Vaping Use  . Vaping Use: Never used  Substance and Sexual Activity  . Alcohol use: Yes    Alcohol/week: 0.0 standard drinks    Comment: rare  . Drug use: No  . Sexual activity: Never    Birth control/protection: I.U.D.  Other Topics Concern  . Not on file  Social History Narrative   Patient is right-handed.   Social Determinants of Health   Financial Resource Strain: Low Risk   . Difficulty of Paying Living Expenses: Not hard at all   Food Insecurity: No Food Insecurity  . Worried About Charity fundraiser in the Last Year: Never true  . Ran Out of Food in the Last Year: Never true  Transportation Needs: No Transportation Needs  . Lack of Transportation (Medical): No  . Lack of Transportation (Non-Medical): No  Physical Activity: Inactive  . Days of Exercise per Week: 0 days  . Minutes of Exercise per Session: 0 min  Stress: No Stress Concern Present  . Feeling of Stress : Only a little  Social Connections: Not on file  Intimate Partner Violence: Not on file    Observations/Objective:   Height _0  (1.575 m), weight (!) 319 lb (144.7 kg). No acute distress.  Alert and oriented.  Speech fluent and not dysarthric.  Language intact.  .  Assessment and Plan:   1. Benign essential tremor 2.  Residual left ulnar neuralgia s/p decompression and neuropathy - over past couple of days increased pain possibly due to cold weather 3.  Polyneuropathy  1.  Primidone 58m in AM and 1570mat bedtime.  2.  Horizant 30051mt bedtime for neuralgia.  If pain persists, consider increasing dose.  3.  Follow up 6 months.  Follow Up Instructions:    -I discussed the assessment and treatment plan with the patient. The patient was provided an opportunity to ask questions and all were answered. The patient agreed with the plan and demonstrated an understanding of the instructions.   The patient was advised to call back or seek an in-person evaluation if the symptoms worsen or if the condition fails to improve as anticipated.   AdaDudley [facility]

## 2020-07-23 ENCOUNTER — Encounter: Payer: Self-pay | Admitting: Neurology

## 2020-07-23 ENCOUNTER — Other Ambulatory Visit: Payer: Self-pay

## 2020-07-23 ENCOUNTER — Telehealth (INDEPENDENT_AMBULATORY_CARE_PROVIDER_SITE_OTHER): Payer: Medicare Other | Admitting: Neurology

## 2020-07-23 VITALS — Ht 62.0 in | Wt 319.0 lb

## 2020-07-23 DIAGNOSIS — G609 Hereditary and idiopathic neuropathy, unspecified: Secondary | ICD-10-CM

## 2020-07-23 DIAGNOSIS — G5622 Lesion of ulnar nerve, left upper limb: Secondary | ICD-10-CM

## 2020-07-23 DIAGNOSIS — G250 Essential tremor: Secondary | ICD-10-CM

## 2020-07-27 ENCOUNTER — Encounter: Payer: Self-pay | Admitting: Internal Medicine

## 2020-07-30 ENCOUNTER — Telehealth (INDEPENDENT_AMBULATORY_CARE_PROVIDER_SITE_OTHER): Payer: Medicare Other | Admitting: Internal Medicine

## 2020-07-30 ENCOUNTER — Encounter: Payer: Self-pay | Admitting: Internal Medicine

## 2020-07-30 DIAGNOSIS — B349 Viral infection, unspecified: Secondary | ICD-10-CM

## 2020-07-30 DIAGNOSIS — J449 Chronic obstructive pulmonary disease, unspecified: Secondary | ICD-10-CM | POA: Diagnosis not present

## 2020-07-30 DIAGNOSIS — E1165 Type 2 diabetes mellitus with hyperglycemia: Secondary | ICD-10-CM

## 2020-07-30 DIAGNOSIS — Z20822 Contact with and (suspected) exposure to covid-19: Secondary | ICD-10-CM | POA: Diagnosis not present

## 2020-07-30 MED ORDER — AZITHROMYCIN 250 MG PO TABS: ORAL_TABLET | ORAL | 0 refills | Status: DC

## 2020-07-30 MED ORDER — DEXAMETHASONE 6 MG PO TABS: 6.0000 mg | ORAL_TABLET | Freq: Every day | ORAL | 0 refills | Status: DC

## 2020-07-30 NOTE — Assessment & Plan Note (Signed)
O/w stable, no wheezing, continue inhaler prn

## 2020-07-30 NOTE — Assessment & Plan Note (Signed)
Lab Results  Component Value Date   HGBA1C 7.6 (A) 10/20/2019   Stable, pt to continue current medical treatment metformin, jardiance  Current Outpatient Medications (Endocrine & Metabolic):  .  dexamethasone (DECADRON) 6 MG tablet, Take 1 tablet (6 mg total) by mouth daily. [name]  JARDIANCE 10 MG TABS tablet, TAKE 1 TABLET BY MOUTH EVERY DAY .  levothyroxine (SYNTHROID) 200 MCG tablet, TAKE 1 TABLET BY MOUTH  DAILY.. .  metFORMIN (GLUCOPHAGE-XR) 500 MG 24 hr tablet, Take 1 tablet (500 mg total) by mouth daily with breakfast.  Current Outpatient Medications (Cardiovascular):  .  bisoprolol-hydrochlorothiazide (ZIAC) 5-6.25 MG tablet, Take 1 tablet by mouth daily. .  furosemide (LASIX) 40 MG tablet, TAKE 1 TABLET BY MOUTH  EVERY OTHER DAY AS NEEDED .  irbesartan (AVAPRO) 75 MG tablet, Take 1 tablet (75 mg total) by mouth at bedtime. .  simvastatin (ZOCOR) 40 MG tablet, TAKE 1 TABLET BY MOUTH AT bedtime .  spironolactone (ALDACTONE) 25 MG tablet, TAKE 1 TABLET BY MOUTH  TWICE DAILY  Current Outpatient Medications (Respiratory):  .  albuterol (PROAIR HFA) 108 (90 Base) MCG/ACT inhaler, 2 puffs every 6 hours as needed .  albuterol (PROVENTIL) (2.5 MG/3ML) 0.083% nebulizer solution, Take 3 mLs (2.5 mg total) by nebulization every 6 (six) hours as needed for wheezing or shortness of breath. .  fluticasone furoate-vilanterol (BREO ELLIPTA) 100-25 MCG/INH AEPB, INHALE 1 PUFF INTO THE LUNGS EVERY MORNING. .  montelukast (SINGULAIR) 10 MG tablet, Take 1 tablet (10 mg total) by mouth at bedtime.  Current Outpatient Medications (Analgesics):  .  acetaminophen (TYLENOL) 500 MG tablet, Take 1,000 mg by mouth every 6 (six) hours as needed for mild pain, moderate pain, fever or headache.  [name]  aspirin EC 81 MG tablet, Take 81 mg by mouth daily.   Current Outpatient Medications (Other):  [name]  Accu-Chek FastClix Lancets MISC, Use to check blood sugar once a day .  azithromycin (ZITHROMAX) 250 MG tablet, 2 tab  by mouth day 1, then 1 per day .  Blood Glucose Monitoring Suppl (ACCU-CHEK AVIVA PLUS) w/Device KIT, Use to check blood sugar once a day .  Cholecalciferol (VITAMIN D3) 125 MCG (5000 UT) CAPS, Take 5,000 Units by mouth daily. .  clonazePAM (KLONOPIN) 0.5 MG tablet, Take 1 tablet (0.5 mg total) by mouth 2 (two) times daily as needed for anxiety. .  Coenzyme Q10-Vitamin E (COQ10-VITAMIN E) 200-20 MG-UNIT CAPS, Take 1 capsule by mouth daily.  .  cyclobenzaprine (FLEXERIL) 5 MG tablet, TAKE 1 TABLET BY MOUTH 3  TIMES DAILY AS NEEDED FOR  MUSCLE SPASM(S) .  Diclofenac Sodium (PENNSAID) 2 % SOLN, Place 2 g onto the skin 2 (two) times daily. .  fluconazole (DIFLUCAN) 150 MG tablet, Take 1 tablet today as directed; repeat after 72 hours .  FLUoxetine (PROZAC) 40 MG capsule, Take 1 capsule (40 mg total) by mouth daily. .  Gabapentin Enacarbil ER (HORIZANT) 300 MG TBCR, Take 300 mg by mouth at bedtime. [name]  GARLIC PO, Take 1 tablet by mouth daily. [name]  glucose blood (ACCU-CHEK AVIVA) test strip, Use to check blood sugar once a day - Aviva Plus .  Lancets Misc. (ACCU-CHEK FASTCLIX LANCET) KIT, Use to check blood sugar once a day .  OXYGEN, 2lpm with sleep only  [facility] .  primidone (MYSOLINE) 50 MG tablet, Take 1 tablet in morning and 3 tablets at bedtime .  Vitamin D, Ergocalciferol, (DRISDOL) 1.25 MG (50000 UNIT) CAPS capsule, Take 1 capsule (50,000  Units total) by mouth every 7 (seven) days. (Patient not taking: Reported on 07/23/2020)

## 2020-07-30 NOTE — Assessment & Plan Note (Signed)
High suspicion for covid illness, needs formal testing and pt plans to do this, ok for decadron course, o/w continue all other meds, and maximize vit D and C and zinc supplements.

## 2020-07-30 NOTE — Patient Instructions (Signed)
[place] for the decadron in addition to the zpack for now  Please continue the plan for covid testing and let us know the results  Please continue all other medications as before, including the inhaler and nebs  Please have the pharmacy call with any other refills you may need.  Please keep your appointments with your specialists as you may have planned

## 2020-07-30 NOTE — Progress Notes (Signed)
Patient ID: Autumn Massey, female   DOB: 10-26-1965, 55 y.o.   MRN: [id number]  Virtual Visit via Video Note  I connected with Autumn Massey on 07/30/20 at  1:00 PM EST by a video enabled telemedicine application and verified that I am speaking with the correct person using two identifiers.  Location of all participants Patient: at home Provider: at office   I discussed the limitations of evaluation and management by telemedicine and the availability of in person appointments. The patient expressed understanding and agreed to proceed.  History of Present Illness: Here to f/u after possible exposure covid jan 20, then onset symptoms for the past wk with STm mild sob, myalgia, chills, lost smell, weak and tired, non prod cough, low grade fevers to 100.1, and some better overall with tylenol and mucinex.  Unvaccinated for covid.  Pt denies chest pain, wheezing, orthopnea, PND, increased LE swelling, palpitations, dizziness or syncope.  Pt denies polydipsia, polyuria,  Past Medical History:  Diagnosis Date  . Anxiety   . ANXIETY DEPRESSION 01/29/2009   04/26/2019- no current  . CHF (congestive heart failure) ([place])   . DEPRESSION 11/19/2007  . Diabetes ([place])   . FREQUENCY, URINARY 10/17/2009  . HYPERLIPIDEMIA 11/19/2007  . HYPERTENSION 11/19/2007  . HYPOTHYROIDISM 11/19/2007  . Morbid obesity ([place])   . Neuropathy 01/26/2019  . Neuropathy   . PNEUMONIA 08/06/2010  . POLYCYTHEMIA 01/29/2009  . Preventative health care 04/06/2011  . Sleep apnea    mild sleep apnea, on o2 at 2l at nighttime  . SLEEP RELATED HYPOVENTILATION/HYPOXEMIA CCE 02/08/2009  . Venous insufficiency 07/19/2012   Past Surgical History:  Procedure Laterality Date  . DILITATION & CURRETTAGE/HYSTROSCOPY WITH NOVASURE ABLATION N/A 04/28/2019   Procedure: DILATATION & CURETTAGE/HYSTEROSCOPY WITH NOVASURE ABLATION;  Surgeon: Molli Posey, MD;  Location: [facility];  Service: Gynecology;  Laterality: N/A;  [employer] rep will be here  confirmed on 04/20/19  . TONSILLECTOMY    . ULNAR TUNNEL RELEASE Left 02/22/2013   Procedure: LEFT ULNAR NERVE DECOMPRESSION ;  Surgeon: Tennis Must, MD;  Location: [facility];  Service: Orthopedics;  Laterality: Left;    reports that she quit smoking about 5 years ago. Her smoking use included cigarettes. She has a 11.00 pack-year smoking history. She has never used smokeless tobacco. She reports current alcohol use. She reports that she does not use drugs. family history includes Alcohol abuse in an other family member; Arthritis in an other family member; Cancer in her maternal grandfather and mother; High Cholesterol in her sister; Hypertension in an other family member; Osteoarthritis (age of onset: 23) in her maternal grandmother; Psoriasis in her brother; Scoliosis in her maternal grandmother. Allergies  Allergen Reactions  . Cymbalta [Duloxetine Hcl]    Current Outpatient Medications on File Prior to Visit  Medication Sig Dispense Refill  . Accu-Chek FastClix Lancets MISC Use to check blood sugar once a day 100 each 3  . acetaminophen (TYLENOL) 500 MG tablet Take 1,000 mg by mouth every 6 (six) hours as needed for mild pain, moderate pain, fever or headache.     . albuterol (PROAIR HFA) 108 (90 Base) MCG/ACT inhaler 2 puffs every 6 hours as needed 54 g 3  . albuterol (PROVENTIL) (2.5 MG/3ML) 0.083% nebulizer solution Take 3 mLs (2.5 mg total) by nebulization every 6 (six) hours as needed for wheezing or shortness of breath. 75 mL 3  . aspirin EC 81 MG tablet Take 81 mg by mouth daily.    [name]  azithromycin (ZITHROMAX) 250 MG tablet 2 tab by mouth day 1, then 1 per day 6 tablet 0  . bisoprolol-hydrochlorothiazide (ZIAC) 5-6.25 MG tablet Take 1 tablet by mouth daily. 90 tablet 3  . Blood Glucose Monitoring Suppl (ACCU-CHEK AVIVA PLUS) w/Device KIT Use to check blood sugar once a day 1 kit 0  . Cholecalciferol (VITAMIN D3) 125 MCG (5000 UT) CAPS Take 5,000 Units by mouth daily.     . clonazePAM (KLONOPIN) 0.5 MG tablet Take 1 tablet (0.5 mg total) by mouth 2 (two) times daily as needed for anxiety. 60 tablet 5  . Coenzyme Q10-Vitamin E (COQ10-VITAMIN E) 200-20 MG-UNIT CAPS Take 1 capsule by mouth daily.     . cyclobenzaprine (FLEXERIL) 5 MG tablet TAKE 1 TABLET BY MOUTH 3  TIMES DAILY AS NEEDED FOR  MUSCLE SPASM(S) 270 tablet 1  . Diclofenac Sodium (PENNSAID) 2 % SOLN Place 2 g onto the skin 2 (two) times daily. 112 g 3  . fluconazole (DIFLUCAN) 150 MG tablet Take 1 tablet today as directed; repeat after 72 hours 2 tablet 0  . FLUoxetine (PROZAC) 40 MG capsule Take 1 capsule (40 mg total) by mouth daily. 90 capsule 3  . fluticasone furoate-vilanterol (BREO ELLIPTA) 100-25 MCG/INH AEPB INHALE 1 PUFF INTO THE LUNGS EVERY MORNING. 90 each 3  . furosemide (LASIX) 40 MG tablet TAKE 1 TABLET BY MOUTH  EVERY OTHER DAY AS NEEDED 90 tablet 3  . Gabapentin Enacarbil ER (HORIZANT) 300 MG TBCR Take 300 mg by mouth at bedtime. 90 tablet 3  . GARLIC PO Take 1 tablet by mouth daily.    [name] glucose blood (ACCU-CHEK AVIVA) test strip Use to check blood sugar once a day - Aviva Plus 100 each 3  . irbesartan (AVAPRO) 75 MG tablet Take 1 tablet (75 mg total) by mouth at bedtime. 90 tablet 3  . JARDIANCE 10 MG TABS tablet TAKE 1 TABLET BY MOUTH EVERY DAY 30 tablet 11  . Lancets Misc. (ACCU-CHEK FASTCLIX LANCET) KIT Use to check blood sugar once a day 1 kit 0  . levothyroxine (SYNTHROID) 200 MCG tablet TAKE 1 TABLET BY MOUTH  DAILY.. 60 tablet 5  . metFORMIN (GLUCOPHAGE-XR) 500 MG 24 hr tablet Take 1 tablet (500 mg total) by mouth daily with breakfast. 90 tablet 3  . montelukast (SINGULAIR) 10 MG tablet Take 1 tablet (10 mg total) by mouth at bedtime. 90 tablet 3  . OXYGEN 2lpm with sleep only  [facility]    . primidone (MYSOLINE) 50 MG tablet Take 1 tablet in morning and 3 tablets at bedtime 360 tablet 3  . simvastatin (ZOCOR) 40 MG tablet TAKE 1 TABLET BY MOUTH AT bedtime 90 tablet 3  .  spironolactone (ALDACTONE) 25 MG tablet TAKE 1 TABLET BY MOUTH  TWICE DAILY 180 tablet 0  . Vitamin D, Ergocalciferol, (DRISDOL) 1.25 MG (50000 UNIT) CAPS capsule Take 1 capsule (50,000 Units total) by mouth every 7 (seven) days. (Patient not taking: Reported on 07/23/2020) 12 capsule 0   No current facility-administered medications on file prior to visit.    Observations/Objective: Alert, NAD, appropriate mood and affect, resps normal, cn 2-12 intact, moves all 4s, no visible rash or swelling Lab Results  Component Value Date   WBC 10.4 01/27/2020   HGB 16.5 (H) 01/27/2020   HCT 51.3 (H) 01/27/2020   PLT 204 01/27/2020   GLUCOSE 191 (H) 04/26/2019   CHOL 195 01/27/2020   TRIG 227 (H) 01/27/2020   HDL 52 01/27/2020  LDLDIRECT 97.0 01/26/2019   LDLCALC 109 (H) 01/27/2020   ALT 17 01/27/2020   AST 20 01/27/2020   NA 137 04/26/2019   K 4.4 04/26/2019   CL 96 (L) 04/26/2019   CREATININE 0.66 04/26/2019   BUN 15 04/26/2019   CO2 29 04/26/2019   TSH 71.60 (H) 01/27/2020   HGBA1C 7.6 (A) 10/20/2019   MICROALBUR 2.2 01/27/2020   Assessment and Plan: See notes  Follow Up Instructions: See notes   I discussed the assessment and treatment plan with the patient. The patient was provided an opportunity to ask questions and all were answered. The patient agreed with the plan and demonstrated an understanding of the instructions.   The patient was advised to call back or seek an in-person evaluation if the symptoms worsen or if the condition fails to improve as anticipated.  [name], MD

## 2020-08-06 NOTE — Telephone Encounter (Signed)
Yes, please can we refer - or do I need to place an order for this , thanks

## 2020-08-07 NOTE — Telephone Encounter (Signed)
Patient calling, states the number we gave her was just the check in number, not the number to make the appointment

## 2020-08-08 ENCOUNTER — Other Ambulatory Visit: Payer: Self-pay | Admitting: Internal Medicine

## 2020-08-08 ENCOUNTER — Encounter: Payer: Self-pay | Admitting: Internal Medicine

## 2020-08-08 NOTE — Telephone Encounter (Signed)
Ok to let pt know there appears to be some mixup in what is needed for this patient  Ok to forward to [facility] to assist with this referral process

## 2020-08-09 ENCOUNTER — Ambulatory Visit (INDEPENDENT_AMBULATORY_CARE_PROVIDER_SITE_OTHER): Payer: Medicare Other | Admitting: Nurse Practitioner

## 2020-08-09 VITALS — BP 135/77 | HR 76 | Temp 96.6°F

## 2020-08-09 DIAGNOSIS — U071 COVID-19: Secondary | ICD-10-CM

## 2020-08-09 DIAGNOSIS — R0602 Shortness of breath: Secondary | ICD-10-CM | POA: Diagnosis not present

## 2020-08-09 MED ORDER — PREDNISONE 20 MG PO TABS: 20.0000 mg | ORAL_TABLET | Freq: Every day | ORAL | 0 refills | Status: AC

## 2020-08-09 MED ORDER — AMOXICILLIN-POT CLAVULANATE 875-125 MG PO TABS: 1.0000 | ORAL_TABLET | Freq: Two times a day (BID) | ORAL | 0 refills | Status: DC

## 2020-08-09 NOTE — Patient Instructions (Signed)
Covid 19 Cough:   Stay well hydrated  Stay active  Deep breathing exercises  May take tylenol for fever or pain  May take mucinex twice daily  Will order chest x ray: [facility]  Will order Augmentin  Will order prednisone   Follow up:  Follow up in 1 week or sooner if needed

## 2020-08-09 NOTE — Telephone Encounter (Signed)
It appears Garnette Czech has given pt the correct number on mychart msg dated 1/28

## 2020-08-09 NOTE — Progress Notes (Signed)
'@Patient'  ID: Autumn Massey, female    DOB: 02-Nov-1965, 55 y.o.   MRN: [id number]  Chief Complaint  Patient presents with  . Covid Positive    Tested positive for covid on 07/30/20, symptoms started 07/23/20    Referring provider: Biagio Borg, MD  HPI  Patient presents today for post COVID care clinic visit.  Patient tested positive for Covid on 07/30/2020.  Symptoms started on 07/23/2020.  Patient has been seen through video visit by her primary care physician.  She has had a round of Z-Pak and steroids.  Patient has not had chest imaging ordered.  She states that she was on 2 L at night before testing positive for Covid, but has had to wear 2 L of oxygen throughout the day for the past few days.  She has her oxygen through [facility] home [facility]. Denies f/c/s, n/v/d, hemoptysis, PND, chest pain or edema.      Allergies  Allergen Reactions  . Cymbalta [Duloxetine Hcl]     Immunization History  Administered Date(s) Administered  . Influenza Split 04/11/2011, 03/15/2012, 03/31/2015  . Influenza Whole 02/20/2010  . Influenza,inj,Quad PF,6+ Mos 08/11/2013, 03/07/2014, 03/10/2017, 03/02/2018  . Pneumococcal Conjugate-13 11/22/2014  . Pneumococcal Polysaccharide-23 11/04/2016  . Td 01/29/2009  . Tdap 01/26/2019    Past Medical History:  Diagnosis Date  . Anxiety   . ANXIETY DEPRESSION 01/29/2009   04/26/2019- no current  . CHF (congestive heart failure) ([place])   . DEPRESSION 11/19/2007  . Diabetes ([place])   . FREQUENCY, URINARY 10/17/2009  . HYPERLIPIDEMIA 11/19/2007  . HYPERTENSION 11/19/2007  . HYPOTHYROIDISM 11/19/2007  . Morbid obesity ([place])   . Neuropathy 01/26/2019  . Neuropathy   . PNEUMONIA 08/06/2010  . POLYCYTHEMIA 01/29/2009  . Preventative health care 04/06/2011  . Sleep apnea    mild sleep apnea, on o2 at 2l at nighttime  . SLEEP RELATED HYPOVENTILATION/HYPOXEMIA CCE 02/08/2009  . Venous insufficiency 07/19/2012    Tobacco History: Social History   Tobacco Use   Smoking Status Former Smoker  . Packs/day: 0.50  . Years: 22.00  . Pack years: 11.00  . Types: Cigarettes  . Quit date: 05/22/2015  . Years since quitting: 5.2  Smokeless Tobacco Never Used   Counseling given: Yes   Outpatient Encounter Medications as of 08/09/2020  Medication Sig  . amoxicillin-clavulanate (AUGMENTIN) 875-125 MG tablet Take 1 tablet by mouth 2 (two) times daily.  . predniSONE (DELTASONE) 20 MG tablet Take 1 tablet (20 mg total) by mouth daily with breakfast for 5 days.  . Accu-Chek FastClix Lancets MISC Use to check blood sugar once a day  . acetaminophen (TYLENOL) 500 MG tablet Take 1,000 mg by mouth every 6 (six) hours as needed for mild pain, moderate pain, fever or headache.   . albuterol (PROAIR HFA) 108 (90 Base) MCG/ACT inhaler 2 puffs every 6 hours as needed  . albuterol (PROVENTIL) (2.5 MG/3ML) 0.083% nebulizer solution Take 3 mLs (2.5 mg total) by nebulization every 6 (six) hours as needed for wheezing or shortness of breath.  [name] aspirin EC 81 MG tablet Take 81 mg by mouth daily.  [name] azithromycin (ZITHROMAX) 250 MG tablet 2 tab by mouth day 1, then 1 per day  . bisoprolol-hydrochlorothiazide (ZIAC) 5-6.25 MG tablet Take 1 tablet by mouth daily.  . Blood Glucose Monitoring Suppl (ACCU-CHEK AVIVA PLUS) w/Device KIT Use to check blood sugar once a day  . Cholecalciferol (VITAMIN D3) 125 MCG (5000 UT) CAPS Take 5,000 Units by mouth  daily.  . clonazePAM (KLONOPIN) 0.5 MG tablet Take 1 tablet (0.5 mg total) by mouth 2 (two) times daily as needed for anxiety.  . Coenzyme Q10-Vitamin E (COQ10-VITAMIN E) 200-20 MG-UNIT CAPS Take 1 capsule by mouth daily.   . cyclobenzaprine (FLEXERIL) 5 MG tablet TAKE 1 TABLET BY MOUTH 3  TIMES DAILY AS NEEDED FOR  MUSCLE SPASM(S)  . Diclofenac Sodium (PENNSAID) 2 % SOLN Place 2 g onto the skin 2 (two) times daily.  . fluconazole (DIFLUCAN) 150 MG tablet Take 1 tablet today as directed; repeat after 72 hours  . FLUoxetine (PROZAC) 40  MG capsule Take 1 capsule (40 mg total) by mouth daily.  . fluticasone furoate-vilanterol (BREO ELLIPTA) 100-25 MCG/INH AEPB INHALE 1 PUFF INTO THE LUNGS EVERY MORNING.  . furosemide (LASIX) 40 MG tablet TAKE 1 TABLET BY MOUTH  EVERY OTHER DAY AS NEEDED  . Gabapentin Enacarbil ER (HORIZANT) 300 MG TBCR Take 300 mg by mouth at bedtime.  [name] GARLIC PO Take 1 tablet by mouth daily.  [name] glucose blood (ACCU-CHEK AVIVA) test strip Use to check blood sugar once a day - Aviva Plus  . irbesartan (AVAPRO) 75 MG tablet Take 1 tablet (75 mg total) by mouth at bedtime.  [name] JARDIANCE 10 MG TABS tablet TAKE 1 TABLET BY MOUTH EVERY DAY  . Lancets Misc. (ACCU-CHEK FASTCLIX LANCET) KIT Use to check blood sugar once a day  . levothyroxine (SYNTHROID) 200 MCG tablet TAKE 1 TABLET BY MOUTH  DAILY..  . metFORMIN (GLUCOPHAGE-XR) 500 MG 24 hr tablet TAKE 1 TABLET BY MOUTH  DAILY WITH BREAKFAST  . montelukast (SINGULAIR) 10 MG tablet Take 1 tablet (10 mg total) by mouth at bedtime.  . OXYGEN 2lpm with sleep only  [facility]  . primidone (MYSOLINE) 50 MG tablet Take 1 tablet in morning and 3 tablets at bedtime  . simvastatin (ZOCOR) 40 MG tablet TAKE 1 TABLET BY MOUTH AT bedtime  . spironolactone (ALDACTONE) 25 MG tablet TAKE 1 TABLET BY MOUTH  TWICE DAILY  . Vitamin D, Ergocalciferol, (DRISDOL) 1.25 MG (50000 UNIT) CAPS capsule Take 1 capsule (50,000 Units total) by mouth every 7 (seven) days. (Patient not taking: Reported on 07/23/2020)  . [DISCONTINUED] dexamethasone (DECADRON) 6 MG tablet Take 1 tablet (6 mg total) by mouth daily.   No facility-administered encounter medications on file as of 08/09/2020.     Review of Systems  Review of Systems  Constitutional: Negative.  Negative for fatigue and fever.  HENT: Negative.   Respiratory: Positive for cough and shortness of breath.   Cardiovascular: Negative.  Negative for chest pain, palpitations and leg swelling.  Gastrointestinal: Negative.   Allergic/Immunologic:  Negative.   Neurological: Negative.   Psychiatric/Behavioral: Negative.        Physical Exam  BP 135/77   Pulse 76   Temp (!) 96.6 F (35.9 C)   Wt Readings from Last 5 Encounters:  07/23/20 (!) 319 lb (144.7 kg)  01/27/20 (!) 334 lb (151.5 kg)  10/20/19 (!) 321 lb (145.6 kg)  10/20/19 (!) 321 lb 6.4 oz (145.8 kg)  09/05/19 (!) 322 lb (146.1 kg)     Physical Exam Vitals and nursing note reviewed.  Constitutional:      General: She is not in acute distress.    Appearance: She is well-developed and well-nourished.  Cardiovascular:     Rate and Rhythm: Normal rate and regular rhythm.  Pulmonary:     Effort: Pulmonary effort is normal.     Breath sounds:  Normal breath sounds.  Neurological:     Mental Status: She is alert and oriented to person, place, and time.  Psychiatric:        Mood and Affect: Mood and affect and mood normal.        Behavior: Behavior normal.      Lab Results:  CBC    Component Value Date/Time   WBC 10.4 01/27/2020 1006   RBC 5.41 (H) 01/27/2020 1006   HGB 16.5 (H) 01/27/2020 1006   HCT 51.3 (H) 01/27/2020 1006   PLT 204 01/27/2020 1006   MCV 94.8 01/27/2020 1006   MCH 30.5 01/27/2020 1006   MCHC 32.2 01/27/2020 1006   RDW 13.0 01/27/2020 1006   LYMPHSABS 2,590 01/27/2020 1006   MONOABS 0.8 01/26/2019 0959   EOSABS 312 01/27/2020 1006   BASOSABS 73 01/27/2020 1006    BMET    Component Value Date/Time   NA 137 04/26/2019 1445   K 4.4 04/26/2019 1445   CL 96 (L) 04/26/2019 1445   CO2 29 04/26/2019 1445   GLUCOSE 191 (H) 04/26/2019 1445   BUN 15 04/26/2019 1445   CREATININE 0.66 04/26/2019 1445   CREATININE 0.73 03/02/2018 1157   CALCIUM 9.6 04/26/2019 1445   GFRNONAA >60 04/26/2019 1445   GFRNONAA 95 03/02/2018 1157   GFRAA >60 04/26/2019 1445   GFRAA 110 03/02/2018 1157    BNP    Component Value Date/Time   BNP 25.3 10/14/2016 2358    ProBNP    Component Value Date/Time   PROBNP 66.0 10/09/2015 1424     Imaging: No results found.   Assessment & Plan:   COVID-19 Cough:   Stay well hydrated  Stay active  Deep breathing exercises  May take tylenol for fever or pain  May take mucinex twice daily  Will order chest x ray: [facility]  Will order Augmentin  Will order prednisone   Follow up:  Follow up in 1 week or sooner if needed      Fenton Foy, NP 08/13/2020

## 2020-08-13 ENCOUNTER — Other Ambulatory Visit: Payer: Self-pay

## 2020-08-13 ENCOUNTER — Ambulatory Visit (HOSPITAL_COMMUNITY)
Admission: RE | Admit: 2020-08-13 | Discharge: 2020-08-13 | Disposition: A | Discharge status: Home / Self Care | Payer: Medicare Other | Source: Ambulatory Visit | Attending: Nurse Practitioner | Admitting: Nurse Practitioner

## 2020-08-13 DIAGNOSIS — U071 COVID-19: Secondary | ICD-10-CM | POA: Insufficient documentation

## 2020-08-13 DIAGNOSIS — R0602 Shortness of breath: Secondary | ICD-10-CM | POA: Diagnosis not present

## 2020-08-13 DIAGNOSIS — R531 Weakness: Secondary | ICD-10-CM | POA: Diagnosis not present

## 2020-08-13 DIAGNOSIS — I517 Cardiomegaly: Secondary | ICD-10-CM | POA: Diagnosis not present

## 2020-08-13 NOTE — Assessment & Plan Note (Signed)
Cough:   Stay well hydrated  Stay active  Deep breathing exercises  May take tylenol for fever or pain  May take mucinex twice daily  Will order chest x ray: [facility]  Will order Augmentin  Will order prednisone   Follow up:  Follow up in 1 week or sooner if needed

## 2020-08-17 ENCOUNTER — Telehealth: Payer: Self-pay | Admitting: *Deleted

## 2020-08-17 NOTE — Telephone Encounter (Signed)
Patient is requesting rx for Diflucan to help with yeast infection since taking ATB.  She has 4 days left of ATB.  Has an appt. On Monday at 4pm.   Would like rx sent to [place].

## 2020-08-20 ENCOUNTER — Ambulatory Visit (INDEPENDENT_AMBULATORY_CARE_PROVIDER_SITE_OTHER): Payer: Medicare Other | Admitting: Nurse Practitioner

## 2020-08-20 VITALS — BP 130/71 | HR 85 | Temp 97.1°F

## 2020-08-20 DIAGNOSIS — J1282 Pneumonia due to coronavirus disease 2019: Secondary | ICD-10-CM

## 2020-08-20 DIAGNOSIS — U071 COVID-19: Secondary | ICD-10-CM | POA: Diagnosis not present

## 2020-08-20 MED ORDER — FLUCONAZOLE 150 MG PO TABS
150.0000 mg | ORAL_TABLET | ORAL | 0 refills | Status: DC
Start: 2020-08-20 — End: 2021-01-24

## 2020-08-20 MED ORDER — NYSTATIN 100000 UNIT/GM EX CREA: 1.0000 "application " | TOPICAL_CREAM | Freq: Two times a day (BID) | CUTANEOUS | 0 refills | Status: DC

## 2020-08-20 MED ORDER — ONDANSETRON 4 MG PO TBDP: 4.0000 mg | ORAL_TABLET | Freq: Three times a day (TID) | ORAL | 0 refills | Status: DC | PRN

## 2020-08-20 MED ORDER — OMEPRAZOLE 20 MG PO CPDR: 20.0000 mg | DELAYED_RELEASE_CAPSULE | Freq: Every day | ORAL | 3 refills | Status: DC

## 2020-08-20 NOTE — Progress Notes (Signed)
'@Patient'  ID: Autumn Massey, female    DOB: 06/18/1966, 55 y.o.   MRN: [id number]  Chief Complaint  Patient presents with  . Follow-up    improving    Referring provider: Biagio Borg, MD  HPI  Patient presents today for post COVID care clinic visit.  This is a follow-up visit.  Patient was last seen here on 08/09/2020.  Patient tested positive for Covid on 07/30/2020.  Symptoms started on 07/23/2020.  Patient was prescribed Z-Pak and steroids by primary care physician.  At her last visit here she was ordered a chest x-ray which did show Covid pneumonia she was also ordered Augmentin and prednisone.  Patient was previously on 2 L of O2 at night before having Covid.  She was having to use O2 during the day but states that she is doing much better now. Denies f/c/s, n/v/d, hemoptysis, PND, chest pain or edema.     Allergies  Allergen Reactions  . Cymbalta [Duloxetine Hcl]     Immunization History  Administered Date(s) Administered  . Influenza Split 04/11/2011, 03/15/2012, 03/31/2015  . Influenza Whole 02/20/2010  . Influenza,inj,Quad PF,6+ Mos 08/11/2013, 03/07/2014, 03/10/2017, 03/02/2018  . Pneumococcal Conjugate-13 11/22/2014  . Pneumococcal Polysaccharide-23 11/04/2016  . Td 01/29/2009  . Tdap 01/26/2019    Past Medical History:  Diagnosis Date  . Anxiety   . ANXIETY DEPRESSION 01/29/2009   04/26/2019- no current  . CHF (congestive heart failure) ([place])   . DEPRESSION 11/19/2007  . Diabetes ([place])   . FREQUENCY, URINARY 10/17/2009  . HYPERLIPIDEMIA 11/19/2007  . HYPERTENSION 11/19/2007  . HYPOTHYROIDISM 11/19/2007  . Morbid obesity ([place])   . Neuropathy 01/26/2019  . Neuropathy   . PNEUMONIA 08/06/2010  . POLYCYTHEMIA 01/29/2009  . Preventative health care 04/06/2011  . Sleep apnea    mild sleep apnea, on o2 at 2l at nighttime  . SLEEP RELATED HYPOVENTILATION/HYPOXEMIA CCE 02/08/2009  . Venous insufficiency 07/19/2012    Tobacco History: Social History   Tobacco Use   Smoking Status Former Smoker  . Packs/day: 0.50  . Years: 22.00  . Pack years: 11.00  . Types: Cigarettes  . Quit date: 05/22/2015  . Years since quitting: 5.2  Smokeless Tobacco Never Used   Counseling given: Yes   Outpatient Encounter Medications as of 08/20/2020  Medication Sig  . fluconazole (DIFLUCAN) 150 MG tablet Take 1 tablet (150 mg total) by mouth every 3 (three) days.  [name] nystatin cream (MYCOSTATIN) Apply 1 application topically 2 (two) times daily.  [name] omeprazole (PRILOSEC) 20 MG capsule Take 1 capsule (20 mg total) by mouth daily.  . ondansetron (ZOFRAN ODT) 4 MG disintegrating tablet Take 1 tablet (4 mg total) by mouth every 8 (eight) hours as needed for nausea or vomiting.  . Accu-Chek FastClix Lancets MISC Use to check blood sugar once a day  . acetaminophen (TYLENOL) 500 MG tablet Take 1,000 mg by mouth every 6 (six) hours as needed for mild pain, moderate pain, fever or headache.   . albuterol (PROAIR HFA) 108 (90 Base) MCG/ACT inhaler 2 puffs every 6 hours as needed  . albuterol (PROVENTIL) (2.5 MG/3ML) 0.083% nebulizer solution Take 3 mLs (2.5 mg total) by nebulization every 6 (six) hours as needed for wheezing or shortness of breath.  [name] amoxicillin-clavulanate (AUGMENTIN) 875-125 MG tablet Take 1 tablet by mouth 2 (two) times daily.  [name] aspirin EC 81 MG tablet Take 81 mg by mouth daily.  [name] azithromycin (ZITHROMAX) 250 MG tablet 2 tab by mouth  day 1, then 1 per day  . bisoprolol-hydrochlorothiazide (ZIAC) 5-6.25 MG tablet Take 1 tablet by mouth daily.  . Blood Glucose Monitoring Suppl (ACCU-CHEK AVIVA PLUS) w/Device KIT Use to check blood sugar once a day  . Cholecalciferol (VITAMIN D3) 125 MCG (5000 UT) CAPS Take 5,000 Units by mouth daily.  . clonazePAM (KLONOPIN) 0.5 MG tablet Take 1 tablet (0.5 mg total) by mouth 2 (two) times daily as needed for anxiety.  . Coenzyme Q10-Vitamin E (COQ10-VITAMIN E) 200-20 MG-UNIT CAPS Take 1 capsule by mouth daily.   .  cyclobenzaprine (FLEXERIL) 5 MG tablet TAKE 1 TABLET BY MOUTH 3  TIMES DAILY AS NEEDED FOR  MUSCLE SPASM(S)  . Diclofenac Sodium (PENNSAID) 2 % SOLN Place 2 g onto the skin 2 (two) times daily.  . fluconazole (DIFLUCAN) 150 MG tablet Take 1 tablet today as directed; repeat after 72 hours  . FLUoxetine (PROZAC) 40 MG capsule Take 1 capsule (40 mg total) by mouth daily.  . fluticasone furoate-vilanterol (BREO ELLIPTA) 100-25 MCG/INH AEPB INHALE 1 PUFF INTO THE LUNGS EVERY MORNING.  . furosemide (LASIX) 40 MG tablet TAKE 1 TABLET BY MOUTH  EVERY OTHER DAY AS NEEDED  . Gabapentin Enacarbil ER (HORIZANT) 300 MG TBCR Take 300 mg by mouth at bedtime.  [name] GARLIC PO Take 1 tablet by mouth daily.  [name] glucose blood (ACCU-CHEK AVIVA) test strip Use to check blood sugar once a day - Aviva Plus  . irbesartan (AVAPRO) 75 MG tablet Take 1 tablet (75 mg total) by mouth at bedtime.  [name] JARDIANCE 10 MG TABS tablet TAKE 1 TABLET BY MOUTH EVERY DAY  . Lancets Misc. (ACCU-CHEK FASTCLIX LANCET) KIT Use to check blood sugar once a day  . levothyroxine (SYNTHROID) 200 MCG tablet TAKE 1 TABLET BY MOUTH  DAILY..  . metFORMIN (GLUCOPHAGE-XR) 500 MG 24 hr tablet TAKE 1 TABLET BY MOUTH  DAILY WITH BREAKFAST  . montelukast (SINGULAIR) 10 MG tablet Take 1 tablet (10 mg total) by mouth at bedtime.  . OXYGEN 2lpm with sleep only  [facility]  . primidone (MYSOLINE) 50 MG tablet Take 1 tablet in morning and 3 tablets at bedtime  . simvastatin (ZOCOR) 40 MG tablet TAKE 1 TABLET BY MOUTH AT bedtime  . spironolactone (ALDACTONE) 25 MG tablet TAKE 1 TABLET BY MOUTH  TWICE DAILY  . Vitamin D, Ergocalciferol, (DRISDOL) 1.25 MG (50000 UNIT) CAPS capsule Take 1 capsule (50,000 Units total) by mouth every 7 (seven) days. (Patient not taking: Reported on 07/23/2020)   No facility-administered encounter medications on file as of 08/20/2020.     Review of Systems  Review of Systems     Physical Exam  BP 130/71   Pulse 85   Temp (!) 97.1 F  (36.2 C)   SpO2 92% Comment: RA  Wt Readings from Last 5 Encounters:  07/23/20 (!) 319 lb (144.7 kg)  01/27/20 (!) 334 lb (151.5 kg)  10/20/19 (!) 321 lb (145.6 kg)  10/20/19 (!) 321 lb 6.4 oz (145.8 kg)  09/05/19 (!) 322 lb (146.1 kg)     Physical Exam   Lab Results:  CBC    Component Value Date/Time   WBC 10.4 01/27/2020 1006   RBC 5.41 (H) 01/27/2020 1006   HGB 16.5 (H) 01/27/2020 1006   HCT 51.3 (H) 01/27/2020 1006   PLT 204 01/27/2020 1006   MCV 94.8 01/27/2020 1006   MCH 30.5 01/27/2020 1006   MCHC 32.2 01/27/2020 1006   RDW 13.0 01/27/2020 1006  LYMPHSABS 2,590 01/27/2020 1006   MONOABS 0.8 01/26/2019 0959   EOSABS 312 01/27/2020 1006   BASOSABS 73 01/27/2020 1006    BMET    Component Value Date/Time   NA 137 04/26/2019 1445   K 4.4 04/26/2019 1445   CL 96 (L) 04/26/2019 1445   CO2 29 04/26/2019 1445   GLUCOSE 191 (H) 04/26/2019 1445   BUN 15 04/26/2019 1445   CREATININE 0.66 04/26/2019 1445   CREATININE 0.73 03/02/2018 1157   CALCIUM 9.6 04/26/2019 1445   GFRNONAA >60 04/26/2019 1445   GFRNONAA 95 03/02/2018 1157   GFRAA >60 04/26/2019 1445   GFRAA 110 03/02/2018 1157    BNP    Component Value Date/Time   BNP 25.3 10/14/2016 2358    ProBNP    Component Value Date/Time   PROBNP 66.0 10/09/2015 1424    Imaging: DG Chest 2 View  Result Date: 08/14/2020 CLINICAL DATA:  COVID.  Shortness of breath.  Weakness. EXAM: CHEST - 2 VIEW COMPARISON:  05/07/2017 FINDINGS: Mild cardiomegaly. Suspected prominence of the epicardial adipose tissues causing some indistinctness of the cardiac margins. Interstitial accentuation noted in both lungs favoring the lung bases and mid lungs, with some peripheral indistinct opacity in the right mid lung which may be a manifestation of pneumonia. No blunting of the costophrenic angles. Thoracic spondylosis is present. IMPRESSION: 1. Indistinct peripheral opacity in the right mid lung, potentially from pneumonia. 2.  Interstitial accentuation in the lung bases and mid lungs, which could be a manifestation of atypical pneumonia. 3. Prominent epicardial adipose tissues causing indistinctness of the cardiac margins. There is likely underlying mild cardiomegaly. Electronically Signed   By: Van Clines M.D.   On: 08/14/2020 15:34     Assessment & Plan:   Pneumonia due to COVID-19 virus Cough Nausea:   Stay well hydrated  Stay active  Deep breathing exercises  May take tylenol or fever or pain  May take mucinex twice daily      Follow up:  Follow up in 2 weeks or sooner if needed - will need repeat imaging      Fenton Foy, NP 08/21/2020

## 2020-08-20 NOTE — Patient Instructions (Addendum)
Covid pneumonia Cough Nausea:   Stay well hydrated  Stay active  Deep breathing exercises  May take tylenol or fever or pain  May take mucinex twice daily      Follow up:  Follow up in 2 weeks or sooner if needed - will need repeat imaging

## 2020-08-21 NOTE — Assessment & Plan Note (Signed)
Cough Nausea:   Stay well hydrated  Stay active  Deep breathing exercises  May take tylenol or fever or pain  May take mucinex twice daily      Follow up:  Follow up in 2 weeks or sooner if needed - will need repeat imaging

## 2020-09-03 ENCOUNTER — Ambulatory Visit: Payer: Medicare Other

## 2020-09-05 ENCOUNTER — Ambulatory Visit (INDEPENDENT_AMBULATORY_CARE_PROVIDER_SITE_OTHER): Payer: Medicare Other | Admitting: Nurse Practitioner

## 2020-09-05 DIAGNOSIS — U071 COVID-19: Secondary | ICD-10-CM | POA: Diagnosis not present

## 2020-09-05 DIAGNOSIS — R079 Chest pain, unspecified: Secondary | ICD-10-CM

## 2020-09-05 DIAGNOSIS — J1282 Pneumonia due to coronavirus disease 2019: Secondary | ICD-10-CM

## 2020-09-05 DIAGNOSIS — Z8616 Personal history of COVID-19: Secondary | ICD-10-CM

## 2020-09-05 NOTE — Progress Notes (Signed)
_0  ID: Autumn Massey, female    DOB: 03-30-1966, 55 y.o.   MRN: [id number]  Chief Complaint  Patient presents with  . Follow-up    Slowly improving    Referring provider: Biagio Borg, MD  HPI  Patient presents today for post COVID care clinic visit follow-up.  Patient was last seen in our office on 08/09/2020.  She states that overall she is slowly improving.  She does not use her oxygen during the day only at night.  She was on 2 L of oxygen at night previously before having COVID.  We discussed that she may still need her oxygen at 2 L with exertion for now.  She is trying to stay active.  She has noticed some intermittent chest pain.  She is due for repeat imaging.  Her last chest x-ray showed pneumonia also showed prominent epicardial adipose tissue with possible underlying cardiomegaly.  We discussed that we will refer her to cardiology.  Patient states that she also does have ongoing nausea.  We discussed that she may need to try starting probiotics.  Denies f/c/s, n/v/d, hemoptysis, PND, chest pain or edema.        Allergies  Allergen Reactions  . Cymbalta [Duloxetine Hcl]     Immunization History  Administered Date(s) Administered  . Influenza Split 04/11/2011, 03/15/2012, 03/31/2015  . Influenza Whole 02/20/2010  . Influenza,inj,Quad PF,6+ Mos 08/11/2013, 03/07/2014, 03/10/2017, 03/02/2018  . Pneumococcal Conjugate-13 11/22/2014  . Pneumococcal Polysaccharide-23 11/04/2016  . Td 01/29/2009  . Tdap 01/26/2019    Past Medical History:  Diagnosis Date  . Anxiety   . ANXIETY DEPRESSION 01/29/2009   04/26/2019- no current  . CHF (congestive heart failure) ([place])   . DEPRESSION 11/19/2007  . Diabetes ([place])   . FREQUENCY, URINARY 10/17/2009  . HYPERLIPIDEMIA 11/19/2007  . HYPERTENSION 11/19/2007  . HYPOTHYROIDISM 11/19/2007  . Morbid obesity ([place])   . Neuropathy 01/26/2019  . Neuropathy   . PNEUMONIA 08/06/2010  . POLYCYTHEMIA 01/29/2009  . Preventative health  care 04/06/2011  . Sleep apnea    mild sleep apnea, on o2 at 2l at nighttime  . SLEEP RELATED HYPOVENTILATION/HYPOXEMIA CCE 02/08/2009  . Venous insufficiency 07/19/2012    Tobacco History: Social History   Tobacco Use  Smoking Status Former Smoker  . Packs/day: 0.50  . Years: 22.00  . Pack years: 11.00  . Types: Cigarettes  . Quit date: 05/22/2015  . Years since quitting: 5.3  Smokeless Tobacco Never Used   Counseling given: Not Answered   Outpatient Encounter Medications as of 09/05/2020  Medication Sig  . Accu-Chek FastClix Lancets MISC Use to check blood sugar once a day  . acetaminophen (TYLENOL) 500 MG tablet Take 1,000 mg by mouth every 6 (six) hours as needed for mild pain, moderate pain, fever or headache.   . albuterol (PROAIR HFA) 108 (90 Base) MCG/ACT inhaler 2 puffs every 6 hours as needed  . albuterol (PROVENTIL) (2.5 MG/3ML) 0.083% nebulizer solution Take 3 mLs (2.5 mg total) by nebulization every 6 (six) hours as needed for wheezing or shortness of breath.  [name] amoxicillin-clavulanate (AUGMENTIN) 875-125 MG tablet Take 1 tablet by mouth 2 (two) times daily.  [name] aspirin EC 81 MG tablet Take 81 mg by mouth daily.  [name] azithromycin (ZITHROMAX) 250 MG tablet 2 tab by mouth day 1, then 1 per day  . bisoprolol-hydrochlorothiazide (ZIAC) 5-6.25 MG tablet Take 1 tablet by mouth daily.  . Blood Glucose Monitoring Suppl (ACCU-CHEK AVIVA PLUS) w/Device KIT  Use to check blood sugar once a day  . Cholecalciferol (VITAMIN D3) 125 MCG (5000 UT) CAPS Take 5,000 Units by mouth daily.  . clonazePAM (KLONOPIN) 0.5 MG tablet Take 1 tablet (0.5 mg total) by mouth 2 (two) times daily as needed for anxiety.  . Coenzyme Q10-Vitamin E (COQ10-VITAMIN E) 200-20 MG-UNIT CAPS Take 1 capsule by mouth daily.   . cyclobenzaprine (FLEXERIL) 5 MG tablet TAKE 1 TABLET BY MOUTH 3  TIMES DAILY AS NEEDED FOR  MUSCLE SPASM(S)  . Diclofenac Sodium (PENNSAID) 2 % SOLN Place 2 g onto the skin 2 (two) times daily.   . fluconazole (DIFLUCAN) 150 MG tablet Take 1 tablet today as directed; repeat after 72 hours  . fluconazole (DIFLUCAN) 150 MG tablet Take 1 tablet (150 mg total) by mouth every 3 (three) days.  [name] FLUoxetine (PROZAC) 40 MG capsule Take 1 capsule (40 mg total) by mouth daily.  . fluticasone furoate-vilanterol (BREO ELLIPTA) 100-25 MCG/INH AEPB INHALE 1 PUFF INTO THE LUNGS EVERY MORNING.  . furosemide (LASIX) 40 MG tablet TAKE 1 TABLET BY MOUTH  EVERY OTHER DAY AS NEEDED  . Gabapentin Enacarbil ER (HORIZANT) 300 MG TBCR Take 300 mg by mouth at bedtime.  [name] GARLIC PO Take 1 tablet by mouth daily.  [name] glucose blood (ACCU-CHEK AVIVA) test strip Use to check blood sugar once a day - Aviva Plus  . irbesartan (AVAPRO) 75 MG tablet Take 1 tablet (75 mg total) by mouth at bedtime.  [name] JARDIANCE 10 MG TABS tablet TAKE 1 TABLET BY MOUTH EVERY DAY  . Lancets Misc. (ACCU-CHEK FASTCLIX LANCET) KIT Use to check blood sugar once a day  . levothyroxine (SYNTHROID) 200 MCG tablet TAKE 1 TABLET BY MOUTH  DAILY..  . metFORMIN (GLUCOPHAGE-XR) 500 MG 24 hr tablet TAKE 1 TABLET BY MOUTH  DAILY WITH BREAKFAST  . montelukast (SINGULAIR) 10 MG tablet Take 1 tablet (10 mg total) by mouth at bedtime.  [name] nystatin cream (MYCOSTATIN) Apply 1 application topically 2 (two) times daily.  [name] omeprazole (PRILOSEC) 20 MG capsule Take 1 capsule (20 mg total) by mouth daily.  . ondansetron (ZOFRAN ODT) 4 MG disintegrating tablet Take 1 tablet (4 mg total) by mouth every 8 (eight) hours as needed for nausea or vomiting.  . OXYGEN 2lpm with sleep only  [facility]  . primidone (MYSOLINE) 50 MG tablet Take 1 tablet in morning and 3 tablets at bedtime  . simvastatin (ZOCOR) 40 MG tablet TAKE 1 TABLET BY MOUTH AT bedtime  . spironolactone (ALDACTONE) 25 MG tablet TAKE 1 TABLET BY MOUTH  TWICE DAILY  . Vitamin D, Ergocalciferol, (DRISDOL) 1.25 MG (50000 UNIT) CAPS capsule Take 1 capsule (50,000 Units total) by mouth every 7 (seven) days. (Patient  not taking: Reported on 07/23/2020)   No facility-administered encounter medications on file as of 09/05/2020.     Review of Systems  Review of Systems  Constitutional: Negative.  Negative for fatigue and fever.  HENT: Negative.   Respiratory: Positive for cough and shortness of breath.   Cardiovascular: Negative.  Negative for chest pain, palpitations and leg swelling.  Gastrointestinal: Negative.   Allergic/Immunologic: Negative.   Neurological: Negative.   Psychiatric/Behavioral: Negative.        Physical Exam  There were no vitals taken for this visit.  Wt Readings from Last 5 Encounters:  07/23/20 (!) 319 lb (144.7 kg)  01/27/20 (!) 334 lb (151.5 kg)  10/20/19 (!) 321 lb (145.6 kg)  10/20/19 (!) 321 lb 6.4 oz (  145.8 kg)  09/05/19 (!) 322 lb (146.1 kg)     Physical Exam Vitals and nursing note reviewed.  Constitutional:      General: She is not in acute distress.    Appearance: She is well-developed.  Cardiovascular:     Rate and Rhythm: Normal rate and regular rhythm.  Pulmonary:     Effort: Pulmonary effort is normal.     Breath sounds: Normal breath sounds.  Musculoskeletal:     Right lower leg: No edema.     Left lower leg: No edema.  Neurological:     Mental Status: She is alert and oriented to person, place, and time.  Psychiatric:        Mood and Affect: Mood normal.        Behavior: Behavior normal.      Imaging: DG Chest 2 View  Result Date: 08/14/2020 CLINICAL DATA:  COVID.  Shortness of breath.  Weakness. EXAM: CHEST - 2 VIEW COMPARISON:  05/07/2017 FINDINGS: Mild cardiomegaly. Suspected prominence of the epicardial adipose tissues causing some indistinctness of the cardiac margins. Interstitial accentuation noted in both lungs favoring the lung bases and mid lungs, with some peripheral indistinct opacity in the right mid lung which may be a manifestation of pneumonia. No blunting of the costophrenic angles. Thoracic spondylosis is present.  IMPRESSION: 1. Indistinct peripheral opacity in the right mid lung, potentially from pneumonia. 2. Interstitial accentuation in the lung bases and mid lungs, which could be a manifestation of atypical pneumonia. 3. Prominent epicardial adipose tissues causing indistinctness of the cardiac margins. There is likely underlying mild cardiomegaly. Electronically Signed   By: Van Clines M.D.   On: 08/14/2020 15:34     Assessment & Plan:   Pneumonia due to COVID-19 virus Cough Nausea:   Stay well hydrated  Stay active  Deep breathing exercises  May take tylenol or fever or pain  Continue zofran as needed  May start probiotics   Will order chest x ray:  [facility] [address], [place] [address] [phone] MON - FRI 8:00 AM - 4:00 PM - WALK IN    Intermittent chest pain:  Will place referral to cardiology    Follow up:  Follow up as needed       Fenton Foy, NP 09/07/2020

## 2020-09-05 NOTE — Patient Instructions (Addendum)
Pneumonia due to COVID-19 virus Cough Nausea:   Stay well hydrated  Stay active  Deep breathing exercises  May take tylenol or fever or pain  Continue zofran as needed  May start probiotics   Will order chest x ray:  [facility] [address], [place] [address] [phone] MON - FRI 8:00 AM - 4:00 PM - WALK IN    Intermittent chest pain:  Will place referral to cardiology    Follow up:  Follow up as needed

## 2020-09-07 DIAGNOSIS — Z8616 Personal history of COVID-19: Secondary | ICD-10-CM | POA: Insufficient documentation

## 2020-09-07 DIAGNOSIS — R079 Chest pain, unspecified: Secondary | ICD-10-CM | POA: Insufficient documentation

## 2020-09-07 NOTE — Assessment & Plan Note (Signed)
Cough Nausea:   Stay well hydrated  Stay active  Deep breathing exercises  May take tylenol or fever or pain  Continue zofran as needed  May start probiotics   Will order chest x ray:  [facility] [address], [place] [address] [phone] MON - FRI 8:00 AM - 4:00 PM - WALK IN    Intermittent chest pain:  Will place referral to cardiology    Follow up:  Follow up as needed

## 2020-09-12 ENCOUNTER — Ambulatory Visit
Admission: RE | Admit: 2020-09-12 | Discharge: 2020-09-12 | Disposition: A | Discharge status: Home / Self Care | Payer: Medicare Other | Source: Ambulatory Visit | Attending: Nurse Practitioner | Admitting: Nurse Practitioner

## 2020-09-12 DIAGNOSIS — R0602 Shortness of breath: Secondary | ICD-10-CM | POA: Diagnosis not present

## 2020-09-12 DIAGNOSIS — U071 COVID-19: Secondary | ICD-10-CM

## 2020-09-12 DIAGNOSIS — R059 Cough, unspecified: Secondary | ICD-10-CM | POA: Diagnosis not present

## 2020-09-12 DIAGNOSIS — R0609 Other forms of dyspnea: Secondary | ICD-10-CM | POA: Diagnosis not present

## 2020-09-17 NOTE — Progress Notes (Signed)
Cardiology Office Note:   Date:  09/19/2020  NAME:  Autumn Massey    MRN: [id number] DOB:  1965/09/08   PCP:  Biagio Borg, MD  Cardiologist:  No primary care provider on file.  Electrophysiologist:  None   Referring MD: Fenton Foy, NP   Chief Complaint  Patient presents with  . Shortness of Breath    History of Present Illness:   Autumn Massey is a 55 y.o. female with a hx of DM, obesity, HLD, hypothyroidism who is being seen today for the evaluation of chest pain at the request of Biagio Borg, MD.  She reports she had coronavirus pneumonia in January.  Since that time she has had worsening shortness of breath.  Most recent chest x-ray shows her lungs are clear.  She reports she is gaining weight.  She is up 11 pounds.  She is never weighed this much.  Blood pressure 130/70.  She reports any activity gets her profoundly short of breath.  Her TSH last year was 71.6.  No repeat has been done.  We do need to repeat this today.  She does have lower extremity edema, right greater than left.  She needs a DVT study.  I did review her pulmonary work-up in the past.  She has restrictive lung disease due to obesity.  She is not follow with pulmonary for a while.  She does wear oxygen at night.  Her EKG in office demonstrates sinus rhythm with a heart rate of 75 and mention of prolonged QTC.  Again she needs her thyroid checked.  She is on any medications that would do this.  She does have lower extremity edema in the start taking her Lasix daily.  She needs a repeat echocardiogram.  She reports a fluttering sensation in her chest.  She also reports nausea.  Mainly occurs after eating.  She reports she is just not hungry.  This is bothersome.  Her lungs are clear today on examination.  Heart sounds are normal.  She does have venous insufficiency changes noted on examination.  She is a former smoker.  She drinks alcohol in moderation.  No drug use is reported.  She is disabled.  Her mother had  heart disease.  She is never had any heart history from what I can tell.  TSH 71.6. Needs repeat.   Problem List 1. DM -A1c 7.6 2. Obesity -BMI 58 3. Hypothyroidism 4. HLD -T chol 195, HDL 52, LDL 109, TG 227  5. Obesity hypoventilation/restrictive lung dz -2L O2 @ night    Past Medical History: Past Medical History:  Diagnosis Date  . Anxiety   . ANXIETY DEPRESSION 01/29/2009   04/26/2019- no current  . CHF (congestive heart failure) ([place])   . DEPRESSION 11/19/2007  . Diabetes ([place])   . FREQUENCY, URINARY 10/17/2009  . HYPERLIPIDEMIA 11/19/2007  . HYPERTENSION 11/19/2007  . HYPOTHYROIDISM 11/19/2007  . Morbid obesity ([place])   . Neuropathy 01/26/2019  . Neuropathy   . PNEUMONIA 08/06/2010  . POLYCYTHEMIA 01/29/2009  . Preventative health care 04/06/2011  . Sleep apnea    mild sleep apnea, on o2 at 2l at nighttime  . SLEEP RELATED HYPOVENTILATION/HYPOXEMIA CCE 02/08/2009  . Venous insufficiency 07/19/2012    Past Surgical History: Past Surgical History:  Procedure Laterality Date  . DILITATION & CURRETTAGE/HYSTROSCOPY WITH NOVASURE ABLATION N/A 04/28/2019   Procedure: DILATATION & CURETTAGE/HYSTEROSCOPY WITH NOVASURE ABLATION;  Surgeon: Molli Posey, MD;  Location: [facility];  Service: Gynecology;  Laterality: N/A;  [employer] rep will be here confirmed on 04/20/19  . TONSILLECTOMY    . ULNAR TUNNEL RELEASE Left 02/22/2013   Procedure: LEFT ULNAR NERVE DECOMPRESSION ;  Surgeon: Tennis Must, MD;  Location: [facility];  Service: Orthopedics;  Laterality: Left;    Current Medications: Current Meds  Medication Sig  . Accu-Chek FastClix Lancets MISC Use to check blood sugar once a day  . acetaminophen (TYLENOL) 500 MG tablet Take 1,000 mg by mouth every 6 (six) hours as needed for mild pain, moderate pain, fever or headache.   . albuterol (PROAIR HFA) 108 (90 Base) MCG/ACT inhaler 2 puffs every 6 hours as needed  . albuterol (PROVENTIL) (2.5 MG/3ML) 0.083% nebulizer  solution Take 3 mLs (2.5 mg total) by nebulization every 6 (six) hours as needed for wheezing or shortness of breath.  [name] aspirin EC 81 MG tablet Take 81 mg by mouth daily.  . bisoprolol-hydrochlorothiazide (ZIAC) 5-6.25 MG tablet Take 1 tablet by mouth daily.  . Blood Glucose Monitoring Suppl (ACCU-CHEK AVIVA PLUS) w/Device KIT Use to check blood sugar once a day  . Cholecalciferol (VITAMIN D3) 125 MCG (5000 UT) CAPS Take 5,000 Units by mouth daily.  . clonazePAM (KLONOPIN) 0.5 MG tablet Take 1 tablet (0.5 mg total) by mouth 2 (two) times daily as needed for anxiety.  . Coenzyme Q10-Vitamin E (COQ10-VITAMIN E) 200-20 MG-UNIT CAPS Take 1 capsule by mouth daily.   . cyclobenzaprine (FLEXERIL) 5 MG tablet TAKE 1 TABLET BY MOUTH 3  TIMES DAILY AS NEEDED FOR  MUSCLE SPASM(S)  . Diclofenac Sodium (PENNSAID) 2 % SOLN Place 2 g onto the skin 2 (two) times daily.  . fluconazole (DIFLUCAN) 150 MG tablet Take 1 tablet today as directed; repeat after 72 hours  . fluconazole (DIFLUCAN) 150 MG tablet Take 1 tablet (150 mg total) by mouth every 3 (three) days.  [name] FLUoxetine (PROZAC) 40 MG capsule Take 1 capsule (40 mg total) by mouth daily.  . fluticasone furoate-vilanterol (BREO ELLIPTA) 100-25 MCG/INH AEPB INHALE 1 PUFF INTO THE LUNGS EVERY MORNING.  . Gabapentin Enacarbil ER (HORIZANT) 300 MG TBCR Take 300 mg by mouth at bedtime.  [name] GARLIC PO Take 1 tablet by mouth daily.  [name] glucose blood (ACCU-CHEK AVIVA) test strip Use to check blood sugar once a day - Aviva Plus  . irbesartan (AVAPRO) 75 MG tablet Take 1 tablet (75 mg total) by mouth at bedtime.  [name] JARDIANCE 10 MG TABS tablet TAKE 1 TABLET BY MOUTH EVERY DAY  . Lancets Misc. (ACCU-CHEK FASTCLIX LANCET) KIT Use to check blood sugar once a day  . levothyroxine (SYNTHROID) 200 MCG tablet TAKE 1 TABLET BY MOUTH  DAILY..  . metFORMIN (GLUCOPHAGE-XR) 500 MG 24 hr tablet TAKE 1 TABLET BY MOUTH  DAILY WITH BREAKFAST  . montelukast (SINGULAIR) 10 MG tablet Take 1  tablet (10 mg total) by mouth at bedtime.  [name] nystatin cream (MYCOSTATIN) Apply 1 application topically 2 (two) times daily.  [name] omeprazole (PRILOSEC) 20 MG capsule Take 1 capsule (20 mg total) by mouth daily.  . ondansetron (ZOFRAN ODT) 4 MG disintegrating tablet Take 1 tablet (4 mg total) by mouth every 8 (eight) hours as needed for nausea or vomiting.  . OXYGEN 2lpm with sleep only  [facility]  . primidone (MYSOLINE) 50 MG tablet Take 1 tablet in morning and 3 tablets at bedtime  . simvastatin (ZOCOR) 40 MG tablet TAKE 1 TABLET BY MOUTH AT bedtime  . spironolactone (ALDACTONE) 25  MG tablet TAKE 1 TABLET BY MOUTH  TWICE DAILY  . [DISCONTINUED] furosemide (LASIX) 40 MG tablet TAKE 1 TABLET BY MOUTH  EVERY OTHER DAY AS NEEDED     Allergies:    Cymbalta [duloxetine hcl]   Social History: Social History   Socioeconomic History  . Marital status: Divorced    Spouse name: Not on file  . Number of children: 0  . Years of education: 58  . Highest education level: 12th grade  Occupational History  . Occupation: Disabled    Employer: [employer]  Tobacco Use  . Smoking status: Former Smoker    Packs/day: 0.50    Years: 22.00    Pack years: 11.00    Types: Cigarettes    Quit date: 05/22/2015    Years since quitting: 5.3  . Smokeless tobacco: Never Used  Vaping Use  . Vaping Use: Never used  Substance and Sexual Activity  . Alcohol use: Yes    Alcohol/week: 0.0 standard drinks    Comment: rare  . Drug use: No  . Sexual activity: Never    Birth control/protection: I.U.D.  Other Topics Concern  . Not on file  Social History Narrative   Patient is right-handed.   Social Determinants of Health   Financial Resource Strain: Low Risk   . Difficulty of Paying Living Expenses: Not hard at all  Food Insecurity: No Food Insecurity  . Worried About Charity fundraiser in the Last Year: Never true  . Ran Out of Food in the Last Year: Never true  Transportation Needs: No Transportation  Needs  . Lack of Transportation (Medical): No  . Lack of Transportation (Non-Medical): No  Physical Activity: Inactive  . Days of Exercise per Week: 0 days  . Minutes of Exercise per Session: 0 min  Stress: No Stress Concern Present  . Feeling of Stress : Only a little  Social Connections: Not on file     Family History: The patient's family history includes Alcohol abuse in an other family member; Arthritis in an other family member; Cancer in her maternal grandfather and mother; Heart disease in her mother; High Cholesterol in her sister; Hypertension in an other family member; Osteoarthritis (age of onset: 27) in her maternal grandmother; Psoriasis in her brother; Scoliosis in her maternal grandmother.  ROS:   All other ROS reviewed and negative. Pertinent positives noted in the HPI.     EKGs/Labs/Other Studies Reviewed:   The following studies were personally reviewed by me today:  EKG:  EKG is ordered today.  The ekg ordered today demonstrates normal sinus rhythm heart rate 75, no acute ischemic changes or evidence of infarction, and was personally reviewed by me.   Recent Labs: 01/27/2020: ALT 17; Hemoglobin 16.5; Platelets 204; TSH 71.60   Recent Lipid Panel    Component Value Date/Time   CHOL 195 01/27/2020 1006   TRIG 227 (H) 01/27/2020 1006   HDL 52 01/27/2020 1006   CHOLHDL 3.8 01/27/2020 1006   VLDL 40.6 (H) 01/26/2019 0959   LDLCALC 109 (H) 01/27/2020 1006   LDLDIRECT 97.0 01/26/2019 0959    Physical Exam:   VS:  BP 130/70   Pulse 73   Ht _0  (1.575 m)   Wt (!) 330 lb 6.4 oz (149.9 kg)   BMI 60.43 kg/m    Wt Readings from Last 3 Encounters:  09/19/20 (!) 330 lb 6.4 oz (149.9 kg)  07/23/20 (!) 319 lb (144.7 kg)  01/27/20 (!) 334 lb (151.5 kg)  General: Well nourished, well developed, in no acute distress Head: Atraumatic, normal size  Eyes: PEERLA, EOMI  Neck: Supple, no JVD Endocrine: No thryomegaly Cardiac: Normal S1, S2; RRR; no murmurs, rubs,  or gallops Lungs: Clear to auscultation bilaterally, no wheezing, rhonchi or rales  Abd: Soft, nontender, no hepatomegaly  Ext: 2+ pitting edema, venous insufficiency changes noted, right greater than left Musculoskeletal: No deformities, BUE and BLE strength normal and equal Skin: Warm and dry, no rashes   Neuro: Alert and oriented to person, place, time, and situation, CNII-XII grossly intact, no focal deficits  Psych: Normal mood and affect   ASSESSMENT:   Autumn Massey is a 55 y.o. female who presents for the following: 1. SOB (shortness of breath)   2. Chest pain, unspecified type   3. Obesity, morbid, BMI 50 or higher ([place])   4. Hyperlipidemia, unspecified hyperlipidemia type   5. Leg edema     PLAN:   1. SOB (shortness of breath) 2. Chest pain, unspecified type -Symptoms of shortness of breath associated with weight gain and increased lower extremity edema.  Symptoms could represent diastolic heart failure.  Lungs are clear today.  Recent chest x-ray is clear.  COVID-19 pneumonia has resolved.  She does have a history of restrictive lung disease and this could be an issue as well.  I would like to repeat an echocardiogram.  I would like to check a BNP and BMP today.  Her symptoms of shortness of breath as well as edema could also be related to hypothyroidism.  She needs a repeat TSH, free T4 and T3.  Her TSH was 71.6 in July and has not been rechecked.  I would also like to obtain lower extremity venous duplex.  She may have a DVT and symptoms could be related to PE.  She is high risk for this.  Given her profound change in symptoms I think a CT PE study is warranted.  We will proceed with this.  I will see her back in 6 months after the above work-up.  We will proceed with the above cardiac work-up.  If negative we will get her back over to see pulmonary.  I fear she does have obesity hypoventilation syndrome.  3. Obesity, morbid, BMI 50 or higher ([place]) -Diet and exercise  recommended.  4. Hyperlipidemia, unspecified hyperlipidemia type -We will work to intensify statin therapy of her next few visits.  5. Leg edema -Could be related to diastolic heart failure as above.  Increase Lasix to 40 mg daily.  Lower extremity venous duplex as well.  Disposition: Return in about 6 weeks (around 10/31/2020).  Medication Adjustments/Labs and Tests Ordered: Current medicines are reviewed at length with the patient today.  Concerns regarding medicines are outlined above.   Orders Placed This Encounter  Procedures  . CT ANGIO CHEST PE W OR WO CONTRAST  . TSH  . T4, free  . T3  . Brain natriuretic peptide  . Basic metabolic panel  . EKG 12-Lead  . ECHOCARDIOGRAM COMPLETE  . VAS [name] LOWER EXTREMITY VENOUS (DVT)   Meds ordered this encounter  Medications  . furosemide (LASIX) 40 MG tablet    Sig: TAKE 1 TABLET BY MOUTH DAILY    Dispense:  90 tablet    Refill:  1    Patient Instructions  Medication Instructions:  Take Lasix 40 mg daily   *If you need a refill on your cardiac medications before your next appointment, please call your pharmacy*  Lab Work: TSH, Free T4, T3, BMET, BNP today  If you have labs (blood work) drawn today and your tests are completely normal, you will receive your results only by: [name] MyChart Message (if you have MyChart) OR . A paper copy in the mail If you have any lab test that is abnormal or we need to change your treatment, we will call you to review the results.   Testing/Procedures: Echocardiogram - Your physician has requested that you have an echocardiogram. Echocardiography is a painless test that uses sound waves to create images of your heart. It provides your doctor with information about the size and shape of your heart and how well your heart's chambers and valves are working. This procedure takes approximately one hour. There are no restrictions for this procedure. This will be performed at our [facility] location -  [address].  LOWER EXTREMITY US  CT CHEST ANGIO PE   Follow-Up: At [facility], you and your health needs are our priority.  As part of our continuing mission to provide you with exceptional heart care, we have created designated Provider Care Teams.  These Care Teams include your primary Cardiologist (physician) and Advanced Practice Providers (APPs -  Physician Assistants and Nurse Practitioners) who all work together to provide you with the care you need, when you need it.  We recommend signing up for the patient portal called "MyChart".  Sign up information is provided on this After Visit Summary.  MyChart is used to connect with patients for Virtual Visits (Telemedicine).  Patients are able to view lab/test results, encounter notes, upcoming appointments, etc.  Non-urgent messages can be sent to your provider as well.   To learn more about what you can do with MyChart, go to [email or web address].    Your next appointment:   6 week(s)  The format for your next appointment:   In Person  Provider:   Eleonore Chiquito, MD        Signed, Addison Naegeli. Audie Box, MD, [place]  [address], [place], [place] [address] [phone]  09/19/2020 4:52 PM

## 2020-09-19 ENCOUNTER — Ambulatory Visit (INDEPENDENT_AMBULATORY_CARE_PROVIDER_SITE_OTHER): Payer: Medicare Other | Admitting: Cardiovascular Disease

## 2020-09-19 ENCOUNTER — Encounter: Payer: Self-pay | Admitting: Cardiovascular Disease

## 2020-09-19 ENCOUNTER — Other Ambulatory Visit: Payer: Self-pay

## 2020-09-19 VITALS — BP 130/70 | HR 73 | Ht 62.0 in | Wt 330.4 lb

## 2020-09-19 DIAGNOSIS — E785 Hyperlipidemia, unspecified: Secondary | ICD-10-CM

## 2020-09-19 DIAGNOSIS — R600 Localized edema: Secondary | ICD-10-CM | POA: Diagnosis not present

## 2020-09-19 DIAGNOSIS — R079 Chest pain, unspecified: Secondary | ICD-10-CM | POA: Diagnosis not present

## 2020-09-19 DIAGNOSIS — R0602 Shortness of breath: Secondary | ICD-10-CM

## 2020-09-19 MED ORDER — FUROSEMIDE 40 MG PO TABS: ORAL_TABLET | ORAL | 1 refills | Status: AC

## 2020-09-19 NOTE — Patient Instructions (Addendum)
Medication Instructions:  Take Lasix 40 mg daily   *If you need a refill on your cardiac medications before your next appointment, please call your pharmacy*   Lab Work: TSH, Free T4, T3, BMET, BNP today  If you have labs (blood work) drawn today and your tests are completely normal, you will receive your results only by: [name] MyChart Message (if you have MyChart) OR . A paper copy in the mail If you have any lab test that is abnormal or we need to change your treatment, we will call you to review the results.   Testing/Procedures: Echocardiogram - Your physician has requested that you have an echocardiogram. Echocardiography is a painless test that uses sound waves to create images of your heart. It provides your doctor with information about the size and shape of your heart and how well your heart's chambers and valves are working. This procedure takes approximately one hour. There are no restrictions for this procedure. This will be performed at our [facility] location - [address].  LOWER EXTREMITY US  CT CHEST ANGIO PE   Follow-Up: At [facility], you and your health needs are our priority.  As part of our continuing mission to provide you with exceptional heart care, we have created designated Provider Care Teams.  These Care Teams include your primary Cardiologist (physician) and Advanced Practice Providers (APPs -  Physician Assistants and Nurse Practitioners) who all work together to provide you with the care you need, when you need it.  We recommend signing up for the patient portal called "MyChart".  Sign up information is provided on this After Visit Summary.  MyChart is used to connect with patients for Virtual Visits (Telemedicine).  Patients are able to view lab/test results, encounter notes, upcoming appointments, etc.  Non-urgent messages can be sent to your provider as well.   To learn more about what you can do with MyChart, go to [email or web address].     Your next appointment:   6 week(s)  The format for your next appointment:   In Person  Provider:   Eleonore Chiquito, MD

## 2020-09-20 LAB — BRAIN NATRIURETIC PEPTIDE: BNP: 37.8 pg/mL (ref 0.0–100.0)

## 2020-09-20 LAB — BASIC METABOLIC PANEL
BUN/Creatinine Ratio: 18 (ref 9–23)
BUN: 13 mg/dL (ref 6–24)
CO2: 26 mmol/L (ref 20–29)
Calcium: 9.5 mg/dL (ref 8.7–10.2)
Chloride: 96 mmol/L (ref 96–106)
Creatinine, Ser: 0.73 mg/dL (ref 0.57–1.00)
Glucose: 159 mg/dL — ABNORMAL HIGH (ref 65–99)
Potassium: 4.5 mmol/L (ref 3.5–5.2)
Sodium: 140 mmol/L (ref 134–144)
eGFR: 98 mL/min/{1.73_m2} (ref >59)

## 2020-09-20 LAB — T4, FREE: Free T4: 0.68 ng/dL — ABNORMAL LOW (ref 0.82–1.77)

## 2020-09-20 LAB — T3: T3, Total: 68 ng/dL — ABNORMAL LOW (ref 71–180)

## 2020-09-20 LAB — TSH: TSH: 42.4 u[IU]/mL — ABNORMAL HIGH (ref 0.450–4.500)

## 2020-09-26 ENCOUNTER — Other Ambulatory Visit: Payer: Self-pay

## 2020-09-26 ENCOUNTER — Encounter (HOSPITAL_COMMUNITY): Payer: Self-pay

## 2020-09-26 ENCOUNTER — Ambulatory Visit (HOSPITAL_COMMUNITY)
Admission: RE | Admit: 2020-09-26 | Discharge: 2020-09-26 | Disposition: A | Discharge status: Home / Self Care | Payer: Medicare Other | Source: Ambulatory Visit | Attending: Cardiovascular Disease | Admitting: Cardiovascular Disease

## 2020-09-26 DIAGNOSIS — R0602 Shortness of breath: Secondary | ICD-10-CM | POA: Diagnosis not present

## 2020-09-26 DIAGNOSIS — I517 Cardiomegaly: Secondary | ICD-10-CM | POA: Diagnosis not present

## 2020-09-26 MED ORDER — IOHEXOL 350 MG/ML SOLN
100.0000 mL | Freq: Once | INTRAVENOUS | Status: AC | PRN
  Administered 2020-09-26: 100 mL via INTRAVENOUS

## 2020-09-29 ENCOUNTER — Other Ambulatory Visit: Payer: Self-pay | Admitting: Internal Medicine

## 2020-10-01 ENCOUNTER — Encounter: Payer: Self-pay | Admitting: Internal Medicine

## 2020-10-01 DIAGNOSIS — E039 Hypothyroidism, unspecified: Secondary | ICD-10-CM

## 2020-10-01 DIAGNOSIS — E538 Deficiency of other specified B group vitamins: Secondary | ICD-10-CM

## 2020-10-01 DIAGNOSIS — E1165 Type 2 diabetes mellitus with hyperglycemia: Secondary | ICD-10-CM

## 2020-10-01 DIAGNOSIS — E559 Vitamin D deficiency, unspecified: Secondary | ICD-10-CM

## 2020-10-01 NOTE — Telephone Encounter (Signed)
Staff to contact pt - I ordered labs  Pt needs ROV after labs drawn at her convenience

## 2020-10-02 ENCOUNTER — Telehealth: Payer: Self-pay

## 2020-10-03 ENCOUNTER — Ambulatory Visit (HOSPITAL_COMMUNITY)
Admission: RE | Admit: 2020-10-03 | Discharge: 2020-10-03 | Disposition: A | Discharge status: Home / Self Care | Payer: Medicare Other | Source: Ambulatory Visit | Attending: Cardiology | Admitting: Cardiology

## 2020-10-03 ENCOUNTER — Encounter: Payer: Self-pay | Admitting: Internal Medicine

## 2020-10-03 ENCOUNTER — Other Ambulatory Visit (INDEPENDENT_AMBULATORY_CARE_PROVIDER_SITE_OTHER): Payer: Medicare Other

## 2020-10-03 ENCOUNTER — Other Ambulatory Visit: Payer: Self-pay

## 2020-10-03 ENCOUNTER — Ambulatory Visit (INDEPENDENT_AMBULATORY_CARE_PROVIDER_SITE_OTHER): Payer: Medicare Other | Admitting: Internal Medicine

## 2020-10-03 VITALS — BP 138/90 | HR 73 | Temp 98.3°F | Ht 62.0 in | Wt 324.0 lb

## 2020-10-03 DIAGNOSIS — Z0001 Encounter for general adult medical examination with abnormal findings: Secondary | ICD-10-CM

## 2020-10-03 DIAGNOSIS — E039 Hypothyroidism, unspecified: Secondary | ICD-10-CM | POA: Diagnosis not present

## 2020-10-03 DIAGNOSIS — S80811A Abrasion, right lower leg, initial encounter: Secondary | ICD-10-CM | POA: Diagnosis not present

## 2020-10-03 DIAGNOSIS — E538 Deficiency of other specified B group vitamins: Secondary | ICD-10-CM

## 2020-10-03 DIAGNOSIS — G8929 Other chronic pain: Secondary | ICD-10-CM

## 2020-10-03 DIAGNOSIS — F32A Depression, unspecified: Secondary | ICD-10-CM | POA: Diagnosis not present

## 2020-10-03 DIAGNOSIS — R600 Localized edema: Secondary | ICD-10-CM | POA: Diagnosis not present

## 2020-10-03 DIAGNOSIS — E559 Vitamin D deficiency, unspecified: Secondary | ICD-10-CM | POA: Diagnosis not present

## 2020-10-03 DIAGNOSIS — I5032 Chronic diastolic (congestive) heart failure: Secondary | ICD-10-CM | POA: Diagnosis not present

## 2020-10-03 DIAGNOSIS — E1165 Type 2 diabetes mellitus with hyperglycemia: Secondary | ICD-10-CM | POA: Diagnosis not present

## 2020-10-03 DIAGNOSIS — M25561 Pain in right knee: Secondary | ICD-10-CM

## 2020-10-03 LAB — LDL CHOLESTEROL, DIRECT: Direct LDL: 95 mg/dL

## 2020-10-03 LAB — LIPID PANEL
Cholesterol: 162 mg/dL (ref 0–200)
HDL: 31.7 mg/dL — ABNORMAL LOW (ref >39.00)
NonHDL: 129.82
Total CHOL/HDL Ratio: 5
Triglycerides: 297 mg/dL — ABNORMAL HIGH (ref 0.0–149.0)
VLDL: 59.4 mg/dL — ABNORMAL HIGH (ref 0.0–40.0)

## 2020-10-03 LAB — CBC WITH DIFFERENTIAL/PLATELET
Basophils Absolute: 0.1 10*3/uL (ref 0.0–0.1)
Basophils Relative: 1.2 % (ref 0.0–3.0)
Eosinophils Absolute: 0.2 10*3/uL (ref 0.0–0.7)
Eosinophils Relative: 3 % (ref 0.0–5.0)
HCT: 46.6 % — ABNORMAL HIGH (ref 36.0–46.0)
Hemoglobin: 15.3 g/dL — ABNORMAL HIGH (ref 12.0–15.0)
Lymphocytes Relative: 22.2 % (ref 12.0–46.0)
Lymphs Abs: 1.8 10*3/uL (ref 0.7–4.0)
MCHC: 32.9 g/dL (ref 30.0–36.0)
MCV: 94.8 fl (ref 78.0–100.0)
Monocytes Absolute: 0.7 10*3/uL (ref 0.1–1.0)
Monocytes Relative: 8.3 % (ref 3.0–12.0)
Neutro Abs: 5.4 10*3/uL (ref 1.4–7.7)
Neutrophils Relative %: 65.3 % (ref 43.0–77.0)
Platelets: 209 10*3/uL (ref 150.0–400.0)
RBC: 4.92 Mil/uL (ref 3.87–5.11)
RDW: 14.4 % (ref 11.5–15.5)
WBC: 8.2 10*3/uL (ref 4.0–10.5)

## 2020-10-03 LAB — HEPATIC FUNCTION PANEL
ALT: 24 U/L (ref 0–35)
AST: 22 U/L (ref 0–37)
Albumin: 4 g/dL (ref 3.5–5.2)
Alkaline Phosphatase: 51 U/L (ref 39–117)
Bilirubin, Direct: 0.1 mg/dL (ref 0.0–0.3)
Total Bilirubin: 0.5 mg/dL (ref 0.2–1.2)
Total Protein: 7.4 g/dL (ref 6.0–8.3)

## 2020-10-03 LAB — HEMOGLOBIN A1C: Hgb A1c MFr Bld: 8.3 % — ABNORMAL HIGH (ref 4.6–6.5)

## 2020-10-03 LAB — T4, FREE: Free T4: 0.9 ng/dL (ref 0.60–1.60)

## 2020-10-03 LAB — BASIC METABOLIC PANEL
BUN: 14 mg/dL (ref 6–23)
CO2: 34 mEq/L — ABNORMAL HIGH (ref 19–32)
Calcium: 9.9 mg/dL (ref 8.4–10.5)
Chloride: 95 mEq/L — ABNORMAL LOW (ref 96–112)
Creatinine, Ser: 0.71 mg/dL (ref 0.40–1.20)
GFR: 96.13 mL/min (ref >60.00)
Glucose, Bld: 154 mg/dL — ABNORMAL HIGH (ref 70–99)
Potassium: 3.8 mEq/L (ref 3.5–5.1)
Sodium: 138 mEq/L (ref 135–145)

## 2020-10-03 LAB — VITAMIN B12: Vitamin B-12: 493 pg/mL (ref 211–911)

## 2020-10-03 LAB — TSH: TSH: 13.74 u[IU]/mL — ABNORMAL HIGH (ref 0.35–4.50)

## 2020-10-03 LAB — VITAMIN D 25 HYDROXY (VIT D DEFICIENCY, FRACTURES): VITD: 29.64 ng/mL — ABNORMAL LOW (ref 30.00–100.00)

## 2020-10-03 NOTE — Patient Instructions (Signed)
Ok to incresae the Vitamin D to 10K units per day  Ok to use the Voltaren gel as needed  Please call for referral to wound clinic if the scratch on the leg gets worse  Please continue all other medications as before, and refills have been done if requested.  Please have the pharmacy call with any other refills you may need.  Please continue your efforts at being more active, low cholesterol diet, and weight control.  You are otherwise up to date with prevention measures today.  Please keep your appointments with your specialists as you may have planned - endocrinology, and cardiology  Please make an Appointment to return in 6 months, or sooner if needed

## 2020-10-03 NOTE — Progress Notes (Signed)
Patient ID: Autumn Massey, female   DOB: 06-16-1966, 55 y.o.   MRN: [id number]         Chief Complaint:: wellness exam and Right Knee Pain (Fever in her knee started 3 days ago with burning sensation. She has taken otc tylenol. She feels like it has gotten a little better but it is still tender to the touch. She has a sore to the back of her right leg. )  and low viut d, DM, diastolic chf/doe, scratch to post distal right leg       HPI:  Autumn Massey is a 55 y.o. female here for wellness exam; pt plans to call for optho and mammgram exams herself, o/w up to date with preventive referrals and immunizations.                         Also c/o right knee pain worsening x 3 days diffusely but somewhat to the back as well with a kind of soreness to the back but cant really point to where it hurts and no swelling or mass noted, no giveaways or falls or other trauma.  May have some sweling to the knee overall but hard to tell from the worsening bilateral leg swelling and 14 lbs wt increase per pt overall in last several months.  Was seen at [facility] and RLE venous doppler neg and CTA chest neg for PE mar 30.  Lasix increased to daily from every 3 days, and has lost 6 lbs already from mar 23.  Taking 5000 u vit d.  Pt denies chest pain, increased sob or doe, wheezing, orthopnea, PND, palpitations, dizziness or syncope, and plans to f/u with cardiology soon.  Pt denies polydipsia, polyuria, Denies other new neuro focal s/s.  Also incidentally has a recent itch to post distal RLE just above the ankle and now with several deep scratches without frank ulceration, redness or drainage.  Denies worsening depressive symptoms, suicidal ideation, or panic;   Wt Readings from Last 3 Encounters:  10/03/20 (!) 324 lb (147 kg)  09/19/20 (!) 330 lb 6.4 oz (149.9 kg)  07/23/20 (!) 319 lb (144.7 kg)   BP Readings from Last 3 Encounters:  10/03/20 138/90  09/19/20 130/70  08/21/20 130/71   Immunization History   Administered Date(s) Administered  . Influenza Split 04/11/2011, 03/15/2012, 03/31/2015  . Influenza Whole 02/20/2010  . Influenza,inj,Quad PF,6+ Mos 08/11/2013, 03/07/2014, 03/10/2017, 03/02/2018  . Pneumococcal Conjugate-13 11/22/2014  . Pneumococcal Polysaccharide-23 11/04/2016  . Td 01/29/2009  . Tdap 01/26/2019   Health Maintenance Due  Topic Date Due  . PAP SMEAR-Modifier  02/29/2020  . MAMMOGRAM  03/02/2020  . OPHTHALMOLOGY EXAM  06/06/2020      Past Medical History:  Diagnosis Date  . Anxiety   . ANXIETY DEPRESSION 01/29/2009   04/26/2019- no current  . CHF (congestive heart failure) ([place])   . DEPRESSION 11/19/2007  . Diabetes ([place])   . FREQUENCY, URINARY 10/17/2009  . HYPERLIPIDEMIA 11/19/2007  . HYPERTENSION 11/19/2007  . HYPOTHYROIDISM 11/19/2007  . Morbid obesity ([place])   . Neuropathy 01/26/2019  . Neuropathy   . PNEUMONIA 08/06/2010  . POLYCYTHEMIA 01/29/2009  . Preventative health care 04/06/2011  . Sleep apnea    mild sleep apnea, on o2 at 2l at nighttime  . SLEEP RELATED HYPOVENTILATION/HYPOXEMIA CCE 02/08/2009  . Venous insufficiency 07/19/2012   Past Surgical History:  Procedure Laterality Date  . [place] N/A 04/28/2019  Procedure: DILATATION & CURETTAGE/HYSTEROSCOPY WITH NOVASURE ABLATION;  Surgeon: Molli Posey, MD;  Location: [facility];  Service: Gynecology;  Laterality: N/A;  Novasure rep will be here confirmed on 04/20/19  . TONSILLECTOMY    . ULNAR TUNNEL RELEASE Left 02/22/2013   Procedure: LEFT ULNAR NERVE DECOMPRESSION ;  Surgeon: Tennis Must, MD;  Location: [place];  Service: Orthopedics;  Laterality: Left;    reports that she quit smoking about 5 years ago. Her smoking use included cigarettes. She has a 11.00 pack-year smoking history. She has never used smokeless tobacco. She reports current alcohol use. She reports that she does not use drugs. family history includes Alcohol abuse  in an other family member; Arthritis in an other family member; Cancer in her maternal grandfather and mother; Heart disease in her mother; High Cholesterol in her sister; Hypertension in an other family member; Osteoarthritis (age of onset: 53) in her maternal grandmother; Psoriasis in her brother; Scoliosis in her maternal grandmother. Allergies  Allergen Reactions  . Cymbalta [Duloxetine Hcl]    Current Outpatient Medications on File Prior to Visit  Medication Sig Dispense Refill  . Accu-Chek FastClix Lancets MISC Use to check blood sugar once a day 100 each 3  . acetaminophen (TYLENOL) 500 MG tablet Take 1,000 mg by mouth every 6 (six) hours as needed for mild pain, moderate pain, fever or headache.     . albuterol (PROAIR HFA) 108 (90 Base) MCG/ACT inhaler 2 puffs every 6 hours as needed 54 g 3  . albuterol (PROVENTIL) (2.5 MG/3ML) 0.083% nebulizer solution Take 3 mLs (2.5 mg total) by nebulization every 6 (six) hours as needed for wheezing or shortness of breath. 75 mL 3  . aspirin EC 81 MG tablet Take 81 mg by mouth daily.    . bisoprolol-hydrochlorothiazide (ZIAC) 5-6.25 MG tablet Take 1 tablet by mouth daily. 90 tablet 3  . Blood Glucose Monitoring Suppl (ACCU-CHEK AVIVA PLUS) w/Device KIT Use to check blood sugar once a day 1 kit 0  . Cholecalciferol (VITAMIN D3) 125 MCG (5000 UT) CAPS Take 5,000 Units by mouth daily.    . clonazePAM (KLONOPIN) 0.5 MG tablet Take 1 tablet (0.5 mg total) by mouth 2 (two) times daily as needed for anxiety. 60 tablet 5  . Coenzyme Q10-Vitamin E (COQ10-VITAMIN E) 200-20 MG-UNIT CAPS Take 1 capsule by mouth daily.     . cyclobenzaprine (FLEXERIL) 5 MG tablet TAKE 1 TABLET BY MOUTH 3  TIMES DAILY AS NEEDED FOR  MUSCLE SPASM(S) 270 tablet 1  . Diclofenac Sodium (PENNSAID) 2 % SOLN Place 2 g onto the skin 2 (two) times daily. 112 g 3  . fluconazole (DIFLUCAN) 150 MG tablet Take 1 tablet today as directed; repeat after 72 hours 2 tablet 0  . fluconazole  (DIFLUCAN) 150 MG tablet Take 1 tablet (150 mg total) by mouth every 3 (three) days. 2 tablet 0  . FLUoxetine (PROZAC) 40 MG capsule Take 1 capsule (40 mg total) by mouth daily. 90 capsule 3  . fluticasone furoate-vilanterol (BREO ELLIPTA) 100-25 MCG/INH AEPB INHALE 1 PUFF INTO THE LUNGS EVERY MORNING. 90 each 3  . furosemide (LASIX) 40 MG tablet TAKE 1 TABLET BY MOUTH DAILY 90 tablet 1  . Gabapentin Enacarbil ER (HORIZANT) 300 MG TBCR Take 300 mg by mouth at bedtime. 90 tablet 3  . GARLIC PO Take 1 tablet by mouth daily.    [name] glucose blood (ACCU-CHEK AVIVA) test strip Use to check blood sugar once a day -  Aviva Plus 100 each 3  . irbesartan (AVAPRO) 75 MG tablet Take 1 tablet (75 mg total) by mouth at bedtime. 90 tablet 3  . JARDIANCE 10 MG TABS tablet TAKE 1 TABLET BY MOUTH EVERY DAY 30 tablet 11  . Lancets Misc. (ACCU-CHEK FASTCLIX LANCET) KIT Use to check blood sugar once a day 1 kit 0  . levothyroxine (SYNTHROID) 200 MCG tablet TAKE 1 TABLET BY MOUTH  DAILY.. 60 tablet 5  . metFORMIN (GLUCOPHAGE-XR) 500 MG 24 hr tablet TAKE 1 TABLET BY MOUTH  DAILY WITH BREAKFAST 30 tablet 0  . montelukast (SINGULAIR) 10 MG tablet Take 1 tablet (10 mg total) by mouth at bedtime. 90 tablet 3  . nystatin cream (MYCOSTATIN) Apply 1 application topically 2 (two) times daily. 30 g 0  . ondansetron (ZOFRAN ODT) 4 MG disintegrating tablet Take 1 tablet (4 mg total) by mouth every 8 (eight) hours as needed for nausea or vomiting. 20 tablet 0  . OXYGEN 2lpm with sleep only  [facility]    . primidone (MYSOLINE) 50 MG tablet Take 1 tablet in morning and 3 tablets at bedtime 360 tablet 3  . simvastatin (ZOCOR) 40 MG tablet TAKE 1 TABLET BY MOUTH AT bedtime 90 tablet 3  . spironolactone (ALDACTONE) 25 MG tablet TAKE 1 TABLET BY MOUTH  TWICE DAILY 60 tablet 0   No current facility-administered medications on file prior to visit.        ROS:  All others reviewed and negative.  Objective        PE:  BP 138/90   Pulse 73    Temp 98.3 F (36.8 C) (Oral)   Ht '5\' 2"'  (1.575 m)   Wt (!) 324 lb (147 kg)   SpO2 92%   BMI 59.26 kg/m                 Constitutional: Pt appears in NAD               HENT: Head: NCAT.                Right Ear: External ear normal.                 Left Ear: External ear normal.                Eyes: . Pupils are equal, round, and reactive to light. Conjunctivae and EOM are normal               Nose: without d/c or deformity               Neck: Neck supple. Gross normal ROM               Cardiovascular: Normal rate and regular rhythm.                 Pulmonary/Chest: Effort normal and breath sounds without rales or wheezing.                Abd:  Soft, NT, ND, + BS, no organomegaly               Neurological: Pt is alert. At baseline orientation, motor grossly intact               Skin: Skin is warm. No rashes, no other new lesions, LE edema - 2+ bilat, also scratch to post distal RLE above the ankle noted without redness, swelling or drainage  Psychiatric: Pt behavior is normal without agitation   Micro: none  Cardiac tracings I have personally interpreted today:  none  Pertinent Radiological findings (summarize): none   Lab Results  Component Value Date   WBC 8.2 10/03/2020   HGB 15.3 (H) 10/03/2020   HCT 46.6 (H) 10/03/2020   PLT 209.0 10/03/2020   GLUCOSE 154 (H) 10/03/2020   CHOL 162 10/03/2020   TRIG 297.0 (H) 10/03/2020   HDL 31.70 (L) 10/03/2020   LDLDIRECT 95.0 10/03/2020   LDLCALC 109 (H) 01/27/2020   ALT 24 10/03/2020   AST 22 10/03/2020   NA 138 10/03/2020   K 3.8 10/03/2020   CL 95 (L) 10/03/2020   CREATININE 0.71 10/03/2020   BUN 14 10/03/2020   CO2 34 (H) 10/03/2020   TSH 13.74 (H) 10/03/2020   HGBA1C 8.3 (H) 10/03/2020   MICROALBUR 2.2 01/27/2020   Assessment/Plan:  Autumn Massey is a 55 y.o. White or Caucasian [1] female with  has a past medical history of Anxiety, ANXIETY DEPRESSION (01/29/2009), CHF (congestive heart failure) ([place]),  DEPRESSION (11/19/2007), Diabetes ([place]), FREQUENCY, URINARY (10/17/2009), HYPERLIPIDEMIA (11/19/2007), HYPERTENSION (11/19/2007), HYPOTHYROIDISM (11/19/2007), Morbid obesity ([place]), Neuropathy (01/26/2019), Neuropathy, PNEUMONIA (08/06/2010), POLYCYTHEMIA (01/29/2009), Preventative health care (04/06/2011), Sleep apnea, SLEEP RELATED HYPOVENTILATION/HYPOXEMIA CCE (02/08/2009), and Venous insufficiency (07/19/2012).  Encounter for well adult exam with abnormal findings Age and sex appropriate education and counseling updated with regular exercise and diet Referrals for preventative services - pt to call for eye exam and mammogram Immunizations addressed - none needed Smoking counseling  - none needed Evidence for depression or other mood disorder - none significant Most recent labs reviewed. I have personally reviewed and have noted: 1) the patient's medical and social history 2) The patient's current medications and supplements 3) The patient's height, weight, and BMI have been recorded in the chart  Chronic diastolic congestive heart failure ([place]) Now on daily lasix, lost 6 lbs, edema and wt some improved, to continue, f/u renal fxn and K, f/u cardiology as planned  Vitamin D deficiency Last vitamin D Lab Results  Component Value Date   VD25OH 29.64 (L) 10/03/2020   Low, to icnreased oral replacement to 10K per day   Type 2 diabetes mellitus with hyperglycemia, without long-term current use of insulin (HCC) Mild uncontrolled,  Lab Results  Component Value Date   HGBA1C 8.3 (H) 10/03/2020  cont same tx, f/u with endo as planned  Hypothyroidism For f/u TSH, may need thyroid med changed   Depression Stable, cont current tx, declines need for referral or change in tx - prozac 40   Right knee pain With worsening last 3 days, exam benign but may have mild effusion, encouraged to f/u sport medicine, has hx of meniscal tear 2015  Scratch of right lower leg Self induced but rather deep, no s/s  infection, but consider wound clinic if not improving in the next few days  Followup: Return in about 6 months (around 04/04/2021).  [name], MD 10/08/2020 1:30 AM [facility]

## 2020-10-05 ENCOUNTER — Ambulatory Visit: Payer: Medicare Other | Admitting: Internal Medicine

## 2020-10-08 ENCOUNTER — Encounter: Payer: Self-pay | Admitting: Internal Medicine

## 2020-10-08 DIAGNOSIS — S80811A Abrasion, right lower leg, initial encounter: Secondary | ICD-10-CM | POA: Insufficient documentation

## 2020-10-08 DIAGNOSIS — M25561 Pain in right knee: Secondary | ICD-10-CM | POA: Insufficient documentation

## 2020-10-08 NOTE — Assessment & Plan Note (Signed)
With worsening last 3 days, exam benign but may have mild effusion, encouraged to f/u sport medicine, has hx of meniscal tear 2015

## 2020-10-08 NOTE — Assessment & Plan Note (Signed)
Age and sex appropriate education and counseling updated with regular exercise and diet Referrals for preventative services - pt to call for eye exam and mammogram Immunizations addressed - none needed Smoking counseling  - none needed Evidence for depression or other mood disorder - none significant Most recent labs reviewed. I have personally reviewed and have noted: 1) the patient's medical and social history 2) The patient's current medications and supplements 3) The patient's height, weight, and BMI have been recorded in the chart

## 2020-10-08 NOTE — Assessment & Plan Note (Signed)
Now on daily lasix, lost 6 lbs, edema and wt some improved, to continue, f/u renal fxn and K, f/u cardiology as planned

## 2020-10-08 NOTE — Assessment & Plan Note (Signed)
Mild uncontrolled,  Lab Results  Component Value Date   HGBA1C 8.3 (H) 10/03/2020  cont same tx, f/u with endo as planned

## 2020-10-08 NOTE — Assessment & Plan Note (Signed)
Last vitamin D Lab Results  Component Value Date   VD25OH 29.64 (L) 10/03/2020   Low, to icnreased oral replacement to 10K per day

## 2020-10-08 NOTE — Assessment & Plan Note (Signed)
For f/u TSH, may need thyroid med changed

## 2020-10-08 NOTE — Assessment & Plan Note (Signed)
Stable, cont current tx, declines need for referral or change in tx - prozac 40

## 2020-10-08 NOTE — Assessment & Plan Note (Signed)
Self induced but rather deep, no s/s infection, but consider wound clinic if not improving in the next few days

## 2020-10-10 ENCOUNTER — Other Ambulatory Visit: Payer: Self-pay

## 2020-10-10 ENCOUNTER — Ambulatory Visit (HOSPITAL_COMMUNITY): Payer: Medicare Other | Attending: Cardiology

## 2020-10-10 DIAGNOSIS — R0602 Shortness of breath: Secondary | ICD-10-CM | POA: Insufficient documentation

## 2020-10-10 LAB — ECHOCARDIOGRAM COMPLETE
Area-P 1/2: 3.29 cm2
S' Lateral: 3.4 cm

## 2020-10-10 MED ORDER — PERFLUTREN LIPID MICROSPHERE
1.0000 mL | INTRAVENOUS | Status: AC | PRN
  Administered 2020-10-10: 2 mL via INTRAVENOUS

## 2020-10-17 ENCOUNTER — Telehealth: Payer: Self-pay | Admitting: Internal Medicine

## 2020-10-17 NOTE — Telephone Encounter (Signed)
Pt called because she recently has had new blood work done at her cardiologist and her thyroid levels went from 41 to 13. She would like Dr to look at those results and let her know if she should be adjusting her medication? If so, pt states she is hard of hearing and sometimes doesn't hear the phone ring so if she doesn't hear she will call back but she also gives permission to leave a detailed message regarding the change in medication if that's easier for the nurse.   Ph# [phone]

## 2020-10-18 NOTE — Telephone Encounter (Signed)
T, Her TSH actually improved significantly at last check, compared to the previous, so I would not suggest any change for now but I will recheck her thyroid tests when she returns in June. Ty, C

## 2020-10-18 NOTE — Telephone Encounter (Signed)
Called and left a detailed message for pt advising no changes recommended at this time.

## 2020-10-18 NOTE — Telephone Encounter (Signed)
Please advise 

## 2020-10-25 ENCOUNTER — Telehealth: Payer: Self-pay | Admitting: Internal Medicine

## 2020-10-25 DIAGNOSIS — E1165 Type 2 diabetes mellitus with hyperglycemia: Secondary | ICD-10-CM

## 2020-10-25 DIAGNOSIS — S81801D Unspecified open wound, right lower leg, subsequent encounter: Secondary | ICD-10-CM

## 2020-10-25 NOTE — Telephone Encounter (Signed)
Ok done

## 2020-10-25 NOTE — Telephone Encounter (Signed)
Patient calling, patient was seen on 04.06.22 and had an abrasion on her leg and if it didn't get better to call back and get a referral to a wound care specialist. Patient states it still has not gotten better.

## 2020-10-26 NOTE — Telephone Encounter (Signed)
Notified patient via voicemail. 

## 2020-11-07 ENCOUNTER — Other Ambulatory Visit: Payer: Self-pay | Admitting: Internal Medicine

## 2020-11-19 ENCOUNTER — Other Ambulatory Visit: Payer: Self-pay | Admitting: Family Medicine

## 2020-11-19 NOTE — Progress Notes (Deleted)
Cardiology Office Note:   Date:  11/19/2020  NAME:  Autumn Massey    MRN: [id number] DOB:  12-Jul-1965   PCP:  Biagio Borg, MD  Cardiologist:  None  Electrophysiologist:  None   Referring MD: Biagio Borg, MD   No chief complaint on file. ***  History of Present Illness:   Autumn Massey is a 55 y.o. female with a hx of obesity, DM, obesity hypoventilation/restrictive lung dz who presents for follow-up. Seen for SOB after covid. CT PE negative. BNP normal. Echo normal.   TSH 13.74, T4 0.90  CT PE 09/26/2020 -> no CAC  Problem List 1. DM -A1c 7.6 2. Obesity -BMI 58 3. Hypothyroidism 4. HLD -T chol 195, HDL 52, LDL 109, TG 227  5. Obesity hypoventilation/restrictive lung dz -2L O2 @ night   Past Medical History: Past Medical History:  Diagnosis Date  . Anxiety   . ANXIETY DEPRESSION 01/29/2009   04/26/2019- no current  . CHF (congestive heart failure) ([place])   . DEPRESSION 11/19/2007  . Diabetes ([place])   . FREQUENCY, URINARY 10/17/2009  . HYPERLIPIDEMIA 11/19/2007  . HYPERTENSION 11/19/2007  . HYPOTHYROIDISM 11/19/2007  . Morbid obesity ([place])   . Neuropathy 01/26/2019  . Neuropathy   . PNEUMONIA 08/06/2010  . POLYCYTHEMIA 01/29/2009  . Preventative health care 04/06/2011  . Sleep apnea    mild sleep apnea, on o2 at 2l at nighttime  . SLEEP RELATED HYPOVENTILATION/HYPOXEMIA CCE 02/08/2009  . Venous insufficiency 07/19/2012    Past Surgical History: Past Surgical History:  Procedure Laterality Date  . DILITATION & CURRETTAGE/HYSTROSCOPY WITH NOVASURE ABLATION N/A 04/28/2019   Procedure: DILATATION & CURETTAGE/HYSTEROSCOPY WITH NOVASURE ABLATION;  Surgeon: Molli Posey, MD;  Location: [facility];  Service: Gynecology;  Laterality: N/A;  Novasure rep will be here confirmed on 04/20/19  . TONSILLECTOMY    . ULNAR TUNNEL RELEASE Left 02/22/2013   Procedure: LEFT ULNAR NERVE DECOMPRESSION ;  Surgeon: Tennis Must, MD;  Location: [facility];  Service:  Orthopedics;  Laterality: Left;    Current Medications: No outpatient medications have been marked as taking for the 11/21/20 encounter (Appointment) with O'Neal, Cassie Freer, MD.     Allergies:    Cymbalta [duloxetine hcl]   Social History: Social History   Socioeconomic History  . Marital status: Divorced    Spouse name: Not on file  . Number of children: 0  . Years of education: 75  . Highest education level: 12th grade  Occupational History  . Occupation: Disabled    Employer: [employer]  Tobacco Use  . Smoking status: Former Smoker    Packs/day: 0.50    Years: 22.00    Pack years: 11.00    Types: Cigarettes    Quit date: 05/22/2015    Years since quitting: 5.5  . Smokeless tobacco: Never Used  Vaping Use  . Vaping Use: Never used  Substance and Sexual Activity  . Alcohol use: Yes    Alcohol/week: 0.0 standard drinks    Comment: rare  . Drug use: No  . Sexual activity: Never    Birth control/protection: I.U.D.  Other Topics Concern  . Not on file  Social History Narrative   Patient is right-handed.   Social Determinants of Health   Financial Resource Strain: Low Risk   . Difficulty of Paying Living Expenses: Not hard at all  Food Insecurity: No Food Insecurity  . Worried About Charity fundraiser in the Last Year: Never true  .  Ran Out of Food in the Last Year: Never true  Transportation Needs: No Transportation Needs  . Lack of Transportation (Medical): No  . Lack of Transportation (Non-Medical): No  Physical Activity: Inactive  . Days of Exercise per Week: 0 days  . Minutes of Exercise per Session: 0 min  Stress: No Stress Concern Present  . Feeling of Stress : Only a little  Social Connections: Not on file     Family History: The patient's ***family history includes Alcohol abuse in an other family member; Arthritis in an other family member; Cancer in her maternal grandfather and mother; Heart disease in her mother; High Cholesterol in  her sister; Hypertension in an other family member; Osteoarthritis (age of onset: 66) in her maternal grandmother; Psoriasis in her brother; Scoliosis in her maternal grandmother.  ROS:   All other ROS reviewed and negative. Pertinent positives noted in the HPI.     EKGs/Labs/Other Studies Reviewed:   The following studies were personally reviewed by me today:  EKG:  EKG is *** ordered today.  The ekg ordered today demonstrates ***, and was personally reviewed by me.   TTE 10/10/2020 1. Left ventricular ejection fraction, by estimation, is 55 to 60%. The  left ventricle has normal function. The left ventricle has no regional  wall motion abnormalities. There is mild left ventricular hypertrophy.  Left ventricular diastolic parameters  are consistent with Grade I diastolic dysfunction (impaired relaxation).  2. Right ventricular systolic function is normal. The right ventricular  size is normal. Tricuspid regurgitation signal is inadequate for assessing  PA pressure.  3. The mitral valve is normal in structure. No evidence of mitral valve  regurgitation. No evidence of mitral stenosis.  4. The aortic valve was not well visualized. Aortic valve regurgitation  is not visualized. No aortic stenosis is present.  5. The inferior vena cava is normal in size with greater than 50%  respiratory variability, suggesting right atrial pressure of 3 mmHg.  6. Technically difficult study with poor acoustic windows.   LE [name] 10/04/2020 Summary:  RIGHT:  - No evidence of deep vein thrombosis in the lower extremity. No indirect  evidence of obstruction proximal to the inguinal ligament.  - Portions of this examination were limited- see technologist comments  above.  - No cystic structure found in the popliteal fossa.    LEFT:  - No evidence of deep vein thrombosis in the lower extremity. No indirect  evidence of obstruction proximal to the inguinal ligament.  - Portions of this examination  were limited- see technologist comments  above.  - No cystic structure found in the popliteal fossa.   Recent Labs: 09/19/2020: BNP 37.8 10/03/2020: ALT 24; BUN 14; Creatinine, Ser 0.71; Hemoglobin 15.3; Platelets 209.0; Potassium 3.8; Sodium 138; TSH 13.74   Recent Lipid Panel    Component Value Date/Time   CHOL 162 10/03/2020 1444   TRIG 297.0 (H) 10/03/2020 1444   HDL 31.70 (L) 10/03/2020 1444   CHOLHDL 5 10/03/2020 1444   VLDL 59.4 (H) 10/03/2020 1444   LDLCALC 109 (H) 01/27/2020 1006   LDLDIRECT 95.0 10/03/2020 1444    Physical Exam:   VS:  There were no vitals taken for this visit.   Wt Readings from Last 3 Encounters:  10/03/20 (!) 324 lb (147 kg)  09/19/20 (!) 330 lb 6.4 oz (149.9 kg)  07/23/20 (!) 319 lb (144.7 kg)    General: Well nourished, well developed, in no acute distress Head: Atraumatic, normal size  Eyes:  PEERLA, EOMI  Neck: Supple, no JVD Endocrine: No thryomegaly Cardiac: Normal S1, S2; RRR; no murmurs, rubs, or gallops Lungs: Clear to auscultation bilaterally, no wheezing, rhonchi or rales  Abd: Soft, nontender, no hepatomegaly  Ext: No edema, pulses 2+ Musculoskeletal: No deformities, BUE and BLE strength normal and equal Skin: Warm and dry, no rashes   Neuro: Alert and oriented to person, place, time, and situation, CNII-XII grossly intact, no focal deficits  Psych: Normal mood and affect   ASSESSMENT:   Autumn Massey is a 56 y.o. female who presents for the following: No diagnosis found.  PLAN:   There are no diagnoses linked to this encounter.  Disposition: No follow-ups on file.  Medication Adjustments/Labs and Tests Ordered: Current medicines are reviewed at length with the patient today.  Concerns regarding medicines are outlined above.  No orders of the defined types were placed in this encounter.  No orders of the defined types were placed in this encounter.   There are no Patient Instructions on file for this visit.   Time  Spent with Patient: I have spent a total of *** minutes with patient reviewing hospital notes, telemetry, EKGs, labs and examining the patient as well as establishing an assessment and plan that was discussed with the patient.  > 50% of time was spent in direct patient care.  Signed, Addison Naegeli. Audie Box, MD, [place]  [address] [place], [place] [address] [phone]  11/19/2020 5:34 PM

## 2020-11-21 ENCOUNTER — Ambulatory Visit: Payer: Medicare Other | Admitting: Cardiovascular Disease

## 2020-11-21 ENCOUNTER — Encounter (HOSPITAL_BASED_OUTPATIENT_CLINIC_OR_DEPARTMENT_OTHER): Payer: Medicare Other | Admitting: Internal Medicine

## 2020-11-21 DIAGNOSIS — R079 Chest pain, unspecified: Secondary | ICD-10-CM

## 2020-11-21 DIAGNOSIS — R600 Localized edema: Secondary | ICD-10-CM

## 2020-11-21 DIAGNOSIS — E785 Hyperlipidemia, unspecified: Secondary | ICD-10-CM

## 2020-11-21 DIAGNOSIS — R0602 Shortness of breath: Secondary | ICD-10-CM

## 2020-11-23 ENCOUNTER — Other Ambulatory Visit: Payer: Self-pay | Admitting: Internal Medicine

## 2020-11-30 ENCOUNTER — Encounter: Payer: Self-pay | Admitting: Internal Medicine

## 2020-12-03 ENCOUNTER — Ambulatory Visit: Payer: Medicare Other | Admitting: Internal Medicine

## 2020-12-03 NOTE — Progress Notes (Deleted)
Patient ID: Autumn Massey, female   DOB: [birth year], 55 y.o.   MRN: [id number]  This visit occurred during the SARS-CoV-2 public health emergency.  Safety protocols were in place, including screening questions prior to the visit, additional usage of staff PPE, and extensive cleaning of exam room while observing appropriate contact time as indicated for disinfecting solutions.   HPI  Autumn Massey is a 55 y.o.-year-old female, returning for f/u for DM2, hypothyroidism, and PCOS. Last visit 6 months ago. She is on disability now.  She has problems with her grandmother, 62, y/o,  for whom she is the caregiver after she fell and broke her humerus.  PCOS: Reviewed history:  - weight gain: Maximal weight 364  - was on Phentermine, which worked initially, then stopped working  - Would consider gastric bypass but only as a last resort - + hirsutism >> started to see facial hair in her 30s - + hair loss >> female pattern, and bald spot in vertex - No acne - had a Mirena IUD placed 04/2013 for heavy menstrual cycles >> out now; no menses since IUD out - has frequent Prednisone tapers    She was previously on spironolactone 50 mg twice daily but her potassium increased so we had to decrease the dose to 25 mg twice daily.  We ended up stopping spironolactone 11/2016.  However, she started to have more facial hair and more frontal balding so we restarted spironolactone 25 mg twice a day.  Her prolactin level is normal now Lab Results  Component Value Date   K 3.8 10/03/2020   Hypothyroidism: Pt. has been dx with hypothyroidism in 2005.  We decreased the dose at last visit as TSH was still suppressed. She did not return for repeat labs...  Pt is on levothyroxine 200 mcg daily, taken: - in am - fasting - at least 30 min from b'fast - no Ca, Fe, MVI, PPIs - not on Biotin  Reviewed her TFTs: Lab Results  Component Value Date   TSH 13.74 (H) 10/03/2020   TSH 42.400 (H) 09/19/2020   TSH  71.60 (H) 01/27/2020   TSH 0.80 10/20/2019   TSH 0.19 (L) 03/01/2019   TSH 0.16 (L) 01/26/2019   TSH 0.65 07/26/2018   TSH 6.36 (H) 04/30/2018   TSH 0.16 (L) 03/02/2018   TSH 0.26 (L) 01/21/2018   FREET4 0.90 10/03/2020   FREET4 0.68 (L) 09/19/2020   FREET4 1.17 10/20/2019   FREET4 1.23 03/01/2019   FREET4 1.21 07/26/2018   FREET4 0.97 04/30/2018   FREET4 1.32 03/02/2018   FREET4 1.25 05/05/2017   FREET4 1.02 03/10/2017   FREET4 0.86 12/01/2016    Pt denies: - feeling nodules in neck - hoarseness - dysphagia - choking - SOB with lying down  She was on Neurontin for tremors, but had to decrease the dose because of somnolence and then she was able to stop. She is on primidone >> restarted by Dr. Tomi Likens and increased today.  She is on Lyrica at night >> will switch to Gabapentin.  DM2:  Reviewed HbA1c levels: 04/26/2019: HbA1c calculated from fructosamine is 5.4% 03/09/2018: HbA1c calculated from fructosamine is better, at 5.5%! 09/02/2017: HbA1c calculated from Fructosamine is 6.1%. 05/05/2017: HbA1c calculated from Fructosamine is 6.00%. 03/10/2017: HbA1c calculated from Fructosamine is 6.17% (great!). 11/2016: HbA1c calculated from the fructosamine is radically better, at 5.77%! Lab Results  Component Value Date   HGBA1C 8.3 (H) 10/03/2020   HGBA1C 7.6 (A) 10/20/2019   HGBA1C 7.2 (  H) 04/26/2019   She is on: - Metformin 1000 mg with dinner >> 500 mg with dinner >> in a.m.  - Jardiance 10 mg in the pm -  started 02/2017 >> stopped b/c UTI and also had a tear on her labia and was afraid that this could have gotten infected if she continued with Jardiance.  We restarted Jardiance in 02/2018 and she is tolerating it well now  Not taking Januvia >> not covered. She was previously on glipizide  She checks her sugars seldom: - am: 1127-140 >> 124-134, 140 >> 131 >> n/c >> 108, 120-130s, 147 - lunch: n/c - after lunch: 145-150 >> 142 >> 131-152 >> n/c - dinner: n/c >>  120-140, 157 >> n/c - after dinner: 140-150 >> up to 160 >> 108 >> 150-160, 170  She quit smoking 05/2015.  She is on weekly ergocalciferol.  She had a D&C on 04/28/2019.  ROS: Constitutional: + Weight gain/no weight loss, no fatigue, no subjective hyperthermia, no subjective hypothermia Eyes: no blurry vision, no xerophthalmia ENT: no sore throat, + see HPI Cardiovascular: no CP/no SOB/no palpitations/no leg swelling Respiratory: no cough/no SOB/no wheezing Gastrointestinal: no N/no V/no D/no C/no acid reflux Musculoskeletal: no muscle aches/no joint aches Skin: no rashes, no hair loss Neurological: + Tremors/no numbness/no tingling/no dizziness  I reviewed pt's medications, allergies, PMH, social hx, family hx, and changes were documented in the history of present illness. Otherwise, unchanged from my initial visit note.  Past Medical History:  Diagnosis Date  . Anxiety   . ANXIETY DEPRESSION 01/29/2009   04/26/2019- no current  . CHF (congestive heart failure) ([place])   . DEPRESSION 11/19/2007  . Diabetes ([place])   . FREQUENCY, URINARY 10/17/2009  . HYPERLIPIDEMIA 11/19/2007  . HYPERTENSION 11/19/2007  . HYPOTHYROIDISM 11/19/2007  . Morbid obesity ([place])   . Neuropathy 01/26/2019  . Neuropathy   . PNEUMONIA 08/06/2010  . POLYCYTHEMIA 01/29/2009  . Preventative health care 04/06/2011  . Sleep apnea    mild sleep apnea, on o2 at 2l at nighttime  . SLEEP RELATED HYPOVENTILATION/HYPOXEMIA CCE 02/08/2009  . Venous insufficiency 07/19/2012   Past Surgical History:  Procedure Laterality Date  . DILITATION & CURRETTAGE/HYSTROSCOPY WITH NOVASURE ABLATION N/A 04/28/2019   Procedure: DILATATION & CURETTAGE/HYSTEROSCOPY WITH NOVASURE ABLATION;  Surgeon: Molli Posey, MD;  Location: [facility];  Service: Gynecology;  Laterality: N/A;  [employer] rep will be here confirmed on 04/20/19  . TONSILLECTOMY    . ULNAR TUNNEL RELEASE Left 02/22/2013   Procedure: LEFT ULNAR NERVE DECOMPRESSION ;  Surgeon:  Tennis Must, MD;  Location: [place];  Service: Orthopedics;  Laterality: Left;   Social History   Socioeconomic History  . Marital status: Divorced    Spouse name: Not on file  . Number of children: 0  . Years of education: 4  . Highest education level: 12th grade  Occupational History  . Occupation: Disabled    Employer: [employer]  Tobacco Use  . Smoking status: Former Smoker    Packs/day: 0.50    Years: 22.00    Pack years: 11.00    Types: Cigarettes    Quit date: 05/22/2015    Years since quitting: 5.5  . Smokeless tobacco: Never Used  Vaping Use  . Vaping Use: Never used  Substance and Sexual Activity  . Alcohol use: Yes    Alcohol/week: 0.0 standard drinks    Comment: rare  . Drug use: No  . Sexual activity: Never  Birth control/protection: I.U.D.  Other Topics Concern  . Not on file  Social History Narrative   Patient is right-handed.   Social Determinants of Health   Financial Resource Strain: Low Risk   . Difficulty of Paying Living Expenses: Not hard at all  Food Insecurity: No Food Insecurity  . Worried About Charity fundraiser in the Last Year: Never true  . Ran Out of Food in the Last Year: Never true  Transportation Needs: No Transportation Needs  . Lack of Transportation (Medical): No  . Lack of Transportation (Non-Medical): No  Physical Activity: Inactive  . Days of Exercise per Week: 0 days  . Minutes of Exercise per Session: 0 min  Stress: No Stress Concern Present  . Feeling of Stress : Only a little  Social Connections: Not on file  Intimate Partner Violence: Not on file   Current Outpatient Medications on File Prior to Visit  Medication Sig Dispense Refill  . Accu-Chek FastClix Lancets MISC Use to check blood sugar once a day 100 each 3  . acetaminophen (TYLENOL) 500 MG tablet Take 1,000 mg by mouth every 6 (six) hours as needed for mild pain, moderate pain, fever or headache.     . albuterol (PROAIR HFA)  108 (90 Base) MCG/ACT inhaler 2 puffs every 6 hours as needed 54 g 3  . albuterol (PROVENTIL) (2.5 MG/3ML) 0.083% nebulizer solution Take 3 mLs (2.5 mg total) by nebulization every 6 (six) hours as needed for wheezing or shortness of breath. 75 mL 3  . aspirin EC 81 MG tablet Take 81 mg by mouth daily.    . bisoprolol-hydrochlorothiazide (ZIAC) 5-6.25 MG tablet Take 1 tablet by mouth daily. 90 tablet 3  . Blood Glucose Monitoring Suppl (ACCU-CHEK AVIVA PLUS) w/Device KIT Use to check blood sugar once a day 1 kit 0  . Cholecalciferol (VITAMIN D3) 125 MCG (5000 UT) CAPS Take 5,000 Units by mouth daily.    . clonazePAM (KLONOPIN) 0.5 MG tablet TAKE 1 TABLET (0.5 MG TOTAL) BY MOUTH 2 (TWO) TIMES DAILY AS NEEDED FOR ANXIETY. 60 tablet 5  . Coenzyme Q10-Vitamin E (COQ10-VITAMIN E) 200-20 MG-UNIT CAPS Take 1 capsule by mouth daily.     . cyclobenzaprine (FLEXERIL) 5 MG tablet TAKE 1 TABLET BY MOUTH 3  TIMES DAILY AS NEEDED FOR  MUSCLE SPASM(S) 270 tablet 1  . Diclofenac Sodium (PENNSAID) 2 % SOLN Place 2 g onto the skin 2 (two) times daily. 112 g 3  . fluconazole (DIFLUCAN) 150 MG tablet Take 1 tablet today as directed; repeat after 72 hours 2 tablet 0  . fluconazole (DIFLUCAN) 150 MG tablet Take 1 tablet (150 mg total) by mouth every 3 (three) days. 2 tablet 0  . FLUoxetine (PROZAC) 40 MG capsule Take 1 capsule (40 mg total) by mouth daily. 90 capsule 3  . fluticasone furoate-vilanterol (BREO ELLIPTA) 100-25 MCG/INH AEPB INHALE 1 PUFF INTO THE LUNGS EVERY MORNING. 90 each 3  . furosemide (LASIX) 40 MG tablet TAKE 1 TABLET BY MOUTH DAILY 90 tablet 1  . Gabapentin Enacarbil ER (HORIZANT) 300 MG TBCR Take 300 mg by mouth at bedtime. 90 tablet 3  . GARLIC PO Take 1 tablet by mouth daily.    [name] glucose blood (ACCU-CHEK AVIVA) test strip Use to check blood sugar once a day - Aviva Plus 100 each 3  . irbesartan (AVAPRO) 75 MG tablet Take 1 tablet (75 mg total) by mouth at bedtime. 90 tablet 3  . JARDIANCE 10 MG  TABS tablet TAKE 1 TABLET BY MOUTH  DAILY 30 tablet 0  . Lancets Misc. (ACCU-CHEK FASTCLIX LANCET) KIT Use to check blood sugar once a day 1 kit 0  . levothyroxine (SYNTHROID) 200 MCG tablet TAKE 1 TABLET BY MOUTH  DAILY.. 60 tablet 5  . metFORMIN (GLUCOPHAGE-XR) 500 MG 24 hr tablet TAKE 1 TABLET BY MOUTH  DAILY WITH BREAKFAST 30 tablet 0  . montelukast (SINGULAIR) 10 MG tablet Take 1 tablet (10 mg total) by mouth at bedtime. 90 tablet 3  . nystatin cream (MYCOSTATIN) Apply 1 application topically 2 (two) times daily. 30 g 0  . ondansetron (ZOFRAN ODT) 4 MG disintegrating tablet Take 1 tablet (4 mg total) by mouth every 8 (eight) hours as needed for nausea or vomiting. 20 tablet 0  . OXYGEN 2lpm with sleep only  [facility]    . primidone (MYSOLINE) 50 MG tablet Take 1 tablet in morning and 3 tablets at bedtime 360 tablet 3  . simvastatin (ZOCOR) 40 MG tablet TAKE 1 TABLET BY MOUTH AT bedtime 90 tablet 3  . spironolactone (ALDACTONE) 25 MG tablet TAKE 1 TABLET BY MOUTH  TWICE DAILY 60 tablet 0   No current facility-administered medications on file prior to visit.   Allergies  Allergen Reactions  . Cymbalta [Duloxetine Hcl]    Family History  Problem Relation Age of Onset  . Cancer Mother        melanoma   . Heart disease Mother   . Cancer Maternal Grandfather        prostate cancer  . Alcohol abuse Other   . Arthritis Other   . Hypertension Other   . High Cholesterol Sister   . Psoriasis Brother   . Osteoarthritis Maternal Grandmother 99  . Scoliosis Maternal Grandmother    PE: There were no vitals taken for this visit. Wt Readings from Last 3 Encounters:  10/03/20 (!) 324 lb (147 kg)  09/19/20 (!) 330 lb 6.4 oz (149.9 kg)  07/23/20 (!) 319 lb (144.7 kg)   Constitutional: overweight, in NAD Eyes: PERRLA, EOMI, no exophthalmos ENT: moist mucous membranes, no thyromegaly, no cervical lymphadenopathy Cardiovascular: RRR, No MRG Respiratory: CTA B Gastrointestinal: abdomen soft, NT,  ND, BS+ Musculoskeletal: no deformities, strength intact in all 4 Skin: moist, warm, no rashes Neurological: no tremor with outstretched hands, DTR normal in all 4  PLAN:  1. Patient with longstanding hypothyroidism -Uncontrolled, since last visit she had very high TSH levels, at 71 and 42, despite an elevated dose of levothyroxine.  Of note she was on the same dose in 09/2019 and at that time TSH was perfect. - latest thyroid labs reviewed with pt. >> still elevated, but improved: Lab Results  Component Value Date   TSH 13.74 (H) 10/03/2020  - she continues on LT4  200 mcg daily - pt feels good on this dose. - we discussed about taking the thyroid hormone every day, with water, >30 minutes before breakfast, separated by >4 hours from acid reflux medications, calcium, iron, multivitamins. Pt. is taking it correctly. - will check thyroid tests today: TSH and fT4 - If labs are abnormal, she will need to return for repeat TFTs in 1.5 months  2. PCOS -She is now at the age of menopause and off her IUD without heavy menstrual cycles -She had increased facial hair and alopecia so she is on spironolactone 25 mg twice a day.  Latest potassium level was normal: Lab Results  Component Value Date   K 3.8  10/03/2020  -She did not feel a significant improvement in her hirsutism after starting spironolactone but this is a low dose, however, we cannot increase it further due to previous hyperkalemia  3. DM2 -Well-controlled on p.o. regimen with metformin and SGLT2 inhibitor.  At last visit, we moved Jardiance to the morning as she was taking it later in the day. -In the past, we had to stop Jardiance due to muscle cramps and then a labial tear, but these have resolved - She is on a low-dose Metformin ER and also back on Jardiance before last visit.  In the past, we had to stop Jardiance due to muscle cramps and then a labial tear.  However, she is tolerating this well now. -At last visit, the HbA1c  calculated from fructosamine was better, at 5.4%.  Fructosamine levels are more accurate for her.  At that time, we moved Jardiance in the morning and Metformin with breakfast since she did was telling me that she cannot tolerate this better when taken in the morning.  At this visit, she is taking Jardiance after she eats and I advised her to move this before breakfast.  For now we can stay with the lower doses, but we may need to increase these at next visit. -At today's visit, HbA1c is 7.6%, higher than before -we will also check a fructosamine level  - I suggested: Patient Instructions  Please continue: - Metformin ER 500 mg with breakfast - Jardiance 10 mg before breakfast  Please continue levothyroxine 200 g daily  Take the thyroid hormone every day, with water, at least 30 minutes before breakfast, separated by at least 4 hours from: - acid reflux medications - calcium - iron - multivitamins  Please stop at the lab.  Continue spironolactone 25 mg twice a day.  Please return in 6 months.  4.  Obesity class III -Continue Jardiance which should also help with weight loss -She gained 12 pounds before last visit -In the past, I suggested a lower fat diet.  She was then doing a partially keto diet with intermittent fasting 16-8.  At last visit she was trying a low-fat diet -Since last visit, she started to exercise at the pool   Philemon Kingdom, MD PhD [facility]

## 2020-12-06 ENCOUNTER — Other Ambulatory Visit: Payer: Self-pay

## 2020-12-06 ENCOUNTER — Encounter: Payer: Self-pay | Admitting: Internal Medicine

## 2020-12-06 ENCOUNTER — Ambulatory Visit (INDEPENDENT_AMBULATORY_CARE_PROVIDER_SITE_OTHER): Payer: Medicare Other | Admitting: Internal Medicine

## 2020-12-06 VITALS — BP 120/68 | HR 68 | Ht 62.0 in | Wt 319.0 lb

## 2020-12-06 DIAGNOSIS — E559 Vitamin D deficiency, unspecified: Secondary | ICD-10-CM | POA: Diagnosis not present

## 2020-12-06 DIAGNOSIS — E1165 Type 2 diabetes mellitus with hyperglycemia: Secondary | ICD-10-CM

## 2020-12-06 DIAGNOSIS — E039 Hypothyroidism, unspecified: Secondary | ICD-10-CM

## 2020-12-06 LAB — T4, FREE: Free T4: 0.7 ng/dL (ref 0.60–1.60)

## 2020-12-06 LAB — TSH: TSH: 21.97 u[IU]/mL — ABNORMAL HIGH (ref 0.35–4.50)

## 2020-12-06 LAB — POCT GLYCOSYLATED HEMOGLOBIN (HGB A1C): Hemoglobin A1C: 8 % — AB (ref 4.0–5.6)

## 2020-12-06 MED ORDER — OZEMPIC (0.25 OR 0.5 MG/DOSE) 2 MG/1.5ML ~~LOC~~ SOPN: 0.5000 mg | PEN_INJECTOR | SUBCUTANEOUS | 3 refills | Status: DC

## 2020-12-06 NOTE — Progress Notes (Signed)
Patient ID: Autumn Massey, female   DOB: 30-Jul-1965, 55 y.o.   MRN: [id number]  This visit occurred during the SARS-CoV-2 public health emergency.  Safety protocols were in place, including screening questions prior to the visit, additional usage of staff PPE, and extensive cleaning of exam room while observing appropriate contact time as indicated for disinfecting solutions.   HPI  Autumn Massey is a 55 y.o.-year-old female, returning for f/u for DM2, hypothyroidism, and PCOS. Last visit 1 year and 2 months ago. She is on disability.  Interim history: She had COVID-19 with pneumonia 02-08/2020.  She had SOB after Covid and also nausea >> saw cardiology >> had testing >> no cardiovascular issues. She still has SOB with exertion. She may need to see pulmonology and have spirometry. She was was advised to increase furosemide to twice a day but she has leg cramps with this - takes mustard, which helps. She gained a significant amount of weight during her COVID-19 infection but now started to lose it.   PCOS: Reviewed history:  - weight gain: Maximal weight 364  - was on Phentermine, which worked initially, then stopped working  - Would consider gastric bypass but only as a last resort - + hirsutism >> started to see facial hair in her 30s - + hair loss >> female pattern, and bald spot in vertex - No acne - had a Mirena IUD placed 04/2013 for heavy menstrual cycles >> out now; no menses since IUD out - has frequent Prednisone tapers    She was previously on spironolactone 50 mg twice daily but her potassium increased so we had to decrease the dose to 25 mg twice daily.  We ended up stopping spironolactone 11/2016.  However, she started to have more facial hair and more frontal balding so we restarted spironolactone 25 mg twice a day.  Her prolactin level is normal now Lab Results  Component Value Date   K 3.8 10/03/2020   Hypothyroidism: Pt. has been dx with hypothyroidism in  2005.  We decreased the dose at last visit as TSH was still suppressed.   She had labs 3 months later and the TSH was extremely high, at 71.6, then 42.4.  I was not aware of these results.   2 months ago her TSH was better, at 13.7.  Pt is on levothyroxine 200 mcg daily, taken: - in am - fasting - at least 30 min from b'fast - no Ca, Fe - + MVI 2h later! - no PPIs - not on Biotin  Reviewed her TFTs: Lab Results  Component Value Date   TSH 13.74 (H) 10/03/2020   TSH 42.400 (H) 09/19/2020   TSH 71.60 (H) 01/27/2020   TSH 0.80 10/20/2019   TSH 0.19 (L) 03/01/2019   TSH 0.16 (L) 01/26/2019   TSH 0.65 07/26/2018   TSH 6.36 (H) 04/30/2018   TSH 0.16 (L) 03/02/2018   TSH 0.26 (L) 01/21/2018   FREET4 0.90 10/03/2020   FREET4 0.68 (L) 09/19/2020   FREET4 1.17 10/20/2019   FREET4 1.23 03/01/2019   FREET4 1.21 07/26/2018   FREET4 0.97 04/30/2018   FREET4 1.32 03/02/2018   FREET4 1.25 05/05/2017   FREET4 1.02 03/10/2017   FREET4 0.86 12/01/2016    Pt denies: - feeling nodules in neck - hoarseness - dysphagia - choking - SOB with lying down  She was on Neurontin for tremors, but had to decrease the dose because of somnolence and then she was able to stop. She is  on primidone >> restarted by Dr. Tomi Likens and increased today.  She is on Lyrica at night >> will switch to Gabapentin.  DM2: Reviewed HbA1c levels: Lab Results  Component Value Date   HGBA1C 8.3 (H) 10/03/2020   HGBA1C 7.6 (A) 10/20/2019   HGBA1C 7.2 (H) 04/26/2019  04/26/2019: HbA1c calculated from fructosamine is 5.4% 03/09/2018: HbA1c calculated from fructosamine is better, at 5.5%! 09/02/2017: HbA1c calculated from Fructosamine is 6.1%. 05/05/2017: HbA1c calculated from Fructosamine is 6.00%. 03/10/2017: HbA1c calculated from Fructosamine is 6.17% (great!). 11/2016: HbA1c calculated from the fructosamine is radically better, at 5.77%!  She is on: - Metformin 1000 mg with dinner >> 500 mg with dinner >> in  a.m. - Jardiance 10 mg in the pm -  started 02/2017 >> initially stopped b/c UTI and also had a tear on her labia >> resolved.  We restarted Jardiance in 02/2018 and she is tolerating it well now  Not taking Januvia >> not covered. She was previously on glipizide  She checks her sugars seldom: - am:  124-134, 140 >> 131 >> n/c >> 108, 120-130s, 147 >> 120s - lunch: n/c - after lunch: 145-150 >> 142 >> 131-152 >> n/c - dinner: n/c >> 120-140, 157 >> n/c - after dinner:  up to 160 >> 108 >> 150-160, 170 >> 149-170  She quit smoking 05/2015.  She is on weekly ergocalciferol.  She had a D&C on 04/28/2019.  ROS: Constitutional: + Weight gain/+ weight loss, + fatigue, no subjective hyperthermia, no subjective hypothermia Eyes: no blurry vision, no xerophthalmia ENT: no sore throat, + see HPI Cardiovascular: no CP/ + SOB/no palpitations/no leg swelling Respiratory: no cough/+ SOB/no wheezing Gastrointestinal: no N/no V/no D/no C/no acid reflux Musculoskeletal: + muscle aches/no joint aches Skin: no rashes, no hair loss Neurological: + Tremors/no numbness/no tingling/no dizziness  I reviewed pt's medications, allergies, PMH, social hx, family hx, and changes were documented in the history of present illness. Otherwise, unchanged from my initial visit note.  Past Medical History:  Diagnosis Date   Anxiety    ANXIETY DEPRESSION 01/29/2009   04/26/2019- no current   CHF (congestive heart failure) ([place])    DEPRESSION 11/19/2007   Diabetes ([place])    FREQUENCY, URINARY 10/17/2009   HYPERLIPIDEMIA 11/19/2007   HYPERTENSION 11/19/2007   HYPOTHYROIDISM 11/19/2007   Morbid obesity ([place])    Neuropathy 01/26/2019   Neuropathy    PNEUMONIA 08/06/2010   POLYCYTHEMIA 01/29/2009   Preventative health care 04/06/2011   Sleep apnea    mild sleep apnea, on o2 at 2l at nighttime   [place] CCE 02/08/2009   Venous insufficiency 07/19/2012   Past Surgical History:  Procedure  Laterality Date   DILITATION & CURRETTAGE/HYSTROSCOPY WITH NOVASURE ABLATION N/A 04/28/2019   Procedure: DILATATION & CURETTAGE/HYSTEROSCOPY WITH NOVASURE ABLATION;  Surgeon: Molli Posey, MD;  Location: [facility];  Service: Gynecology;  Laterality: N/A;  [employer] rep will be here confirmed on 04/20/19   TONSILLECTOMY     ULNAR TUNNEL RELEASE Left 02/22/2013   Procedure: LEFT ULNAR NERVE DECOMPRESSION ;  Surgeon: Tennis Must, MD;  Location: [facility];  Service: Orthopedics;  Laterality: Left;   Social History   Socioeconomic History   Marital status: Divorced    Spouse name: Not on file   Number of children: 0   Years of education: 12   Highest education level: 12th grade  Occupational History   Occupation: Disabled    Employer: [employer]  Tobacco Use  Smoking status: Former    Packs/day: 0.50    Years: 22.00    Pack years: 11.00    Types: Cigarettes    Quit date: 05/22/2015    Years since quitting: 5.5   Smokeless tobacco: Never  Vaping Use   Vaping Use: Never used  Substance and Sexual Activity   Alcohol use: Yes    Alcohol/week: 0.0 standard drinks    Comment: rare   Drug use: No   Sexual activity: Never    Birth control/protection: I.U.D.  Other Topics Concern   Not on file  Social History Narrative   Patient is right-handed.   Social Determinants of Health   Financial Resource Strain: Low Risk    Difficulty of Paying Living Expenses: Not hard at all  Food Insecurity: No Food Insecurity   Worried About Charity fundraiser in the Last Year: Never true   [place] in the Last Year: Never true  Transportation Needs: No Transportation Needs   Lack of Transportation (Medical): No   Lack of Transportation (Non-Medical): No  Physical Activity: Inactive   Days of Exercise per Week: 0 days   Minutes of Exercise per Session: 0 min  Stress: No Stress Concern Present   Feeling of Stress : Only a little  Social Connections: Not on file   Intimate Partner Violence: Not on file   Current Outpatient Medications on File Prior to Visit  Medication Sig Dispense Refill   Accu-Chek FastClix Lancets MISC Use to check blood sugar once a day 100 each 3   acetaminophen (TYLENOL) 500 MG tablet Take 1,000 mg by mouth every 6 (six) hours as needed for mild pain, moderate pain, fever or headache.      albuterol (PROAIR HFA) 108 (90 Base) MCG/ACT inhaler 2 puffs every 6 hours as needed 54 g 3   albuterol (PROVENTIL) (2.5 MG/3ML) 0.083% nebulizer solution Take 3 mLs (2.5 mg total) by nebulization every 6 (six) hours as needed for wheezing or shortness of breath. 75 mL 3   aspirin EC 81 MG tablet Take 81 mg by mouth daily.     bisoprolol-hydrochlorothiazide (ZIAC) 5-6.25 MG tablet Take 1 tablet by mouth daily. 90 tablet 3   Blood Glucose Monitoring Suppl (ACCU-CHEK AVIVA PLUS) w/Device KIT Use to check blood sugar once a day 1 kit 0   Cholecalciferol (VITAMIN D3) 125 MCG (5000 UT) CAPS Take 5,000 Units by mouth daily.     clonazePAM (KLONOPIN) 0.5 MG tablet TAKE 1 TABLET (0.5 MG TOTAL) BY MOUTH 2 (TWO) TIMES DAILY AS NEEDED FOR ANXIETY. 60 tablet 5   Coenzyme Q10-Vitamin E (COQ10-VITAMIN E) 200-20 MG-UNIT CAPS Take 1 capsule by mouth daily.      cyclobenzaprine (FLEXERIL) 5 MG tablet TAKE 1 TABLET BY MOUTH 3  TIMES DAILY AS NEEDED FOR  MUSCLE SPASM(S) 270 tablet 1   Diclofenac Sodium (PENNSAID) 2 % SOLN Place 2 g onto the skin 2 (two) times daily. 112 g 3   fluconazole (DIFLUCAN) 150 MG tablet Take 1 tablet today as directed; repeat after 72 hours 2 tablet 0   fluconazole (DIFLUCAN) 150 MG tablet Take 1 tablet (150 mg total) by mouth every 3 (three) days. 2 tablet 0   FLUoxetine (PROZAC) 40 MG capsule Take 1 capsule (40 mg total) by mouth daily. 90 capsule 3   fluticasone furoate-vilanterol (BREO ELLIPTA) 100-25 MCG/INH AEPB INHALE 1 PUFF INTO THE LUNGS EVERY MORNING. 90 each 3   furosemide (LASIX) 40 MG tablet TAKE 1  TABLET BY MOUTH DAILY 90  tablet 1   Gabapentin Enacarbil ER (HORIZANT) 300 MG TBCR Take 300 mg by mouth at bedtime. 90 tablet 3   GARLIC PO Take 1 tablet by mouth daily.     glucose blood (ACCU-CHEK AVIVA) test strip Use to check blood sugar once a day - Aviva Plus 100 each 3   irbesartan (AVAPRO) 75 MG tablet Take 1 tablet (75 mg total) by mouth at bedtime. 90 tablet 3   JARDIANCE 10 MG TABS tablet TAKE 1 TABLET BY MOUTH  DAILY 30 tablet 0   Lancets Misc. (ACCU-CHEK FASTCLIX LANCET) KIT Use to check blood sugar once a day 1 kit 0   levothyroxine (SYNTHROID) 200 MCG tablet TAKE 1 TABLET BY MOUTH  DAILY.. 60 tablet 5   metFORMIN (GLUCOPHAGE-XR) 500 MG 24 hr tablet TAKE 1 TABLET BY MOUTH  DAILY WITH BREAKFAST 30 tablet 0   montelukast (SINGULAIR) 10 MG tablet Take 1 tablet (10 mg total) by mouth at bedtime. 90 tablet 3   nystatin cream (MYCOSTATIN) Apply 1 application topically 2 (two) times daily. 30 g 0   ondansetron (ZOFRAN ODT) 4 MG disintegrating tablet Take 1 tablet (4 mg total) by mouth every 8 (eight) hours as needed for nausea or vomiting. 20 tablet 0   OXYGEN 2lpm with sleep only  [facility]     primidone (MYSOLINE) 50 MG tablet Take 1 tablet in morning and 3 tablets at bedtime 360 tablet 3   simvastatin (ZOCOR) 40 MG tablet TAKE 1 TABLET BY MOUTH AT bedtime 90 tablet 3   spironolactone (ALDACTONE) 25 MG tablet TAKE 1 TABLET BY MOUTH  TWICE DAILY 60 tablet 0   No current facility-administered medications on file prior to visit.   Allergies  Allergen Reactions   Cymbalta [Duloxetine Hcl]    Family History  Problem Relation Age of Onset   Cancer Mother        melanoma    Heart disease Mother    Cancer Maternal Grandfather        prostate cancer   Alcohol abuse Other    Arthritis Other    Hypertension Other    High Cholesterol Sister    Psoriasis Brother    Osteoarthritis Maternal Grandmother 99   Scoliosis Maternal Grandmother    PE: BP 120/68 (BP Location: Right Arm, Patient Position: Sitting, Cuff  Size: Normal)   Pulse 68   Ht '5\' 2"'  (1.575 m)   Wt (!) 319 lb (144.7 kg)   SpO2 90%   BMI 58.35 kg/m  Wt Readings from Last 3 Encounters:  12/06/20 (!) 319 lb (144.7 kg)  10/03/20 (!) 324 lb (147 kg)  09/19/20 (!) 330 lb 6.4 oz (149.9 kg)   Constitutional: overweight, in NAD Eyes: PERRLA, EOMI, no exophthalmos ENT: moist mucous membranes, no thyromegaly, no cervical lymphadenopathy Cardiovascular: RRR, No MRG Respiratory: CTA B Gastrointestinal: abdomen soft, NT, ND, BS+ Musculoskeletal: no deformities, strength intact in all 4 Skin: moist, warm, no rashes Neurological: no tremor with outstretched hands, DTR normal in all 4  PLAN:  1. Patient with longstanding hypothyroidism -Uncontrolled -Since last visit, she had very high TSH levels, 71 and 42 respectively, despite a high dose of levothyroxine.  I was not aware of these results, but I became aware of her TSH from 09/2020 which improved to 13.7.  He does not know why the thyroid tests worsened to this extent, but it is possible that she was not taking levothyroxine at that time.  Of  note, on the same dose of levothyroxine, TFTs were perfect in 09/2019.  Therefore, I advised her to take the medication consistently but we did not change the dose 2 months ago. -Latest TSH: Lab Results  Component Value Date   TSH 13.74 (H) 10/03/2020  - she continues on LT4 200 mcg daily - we discussed about taking the thyroid hormone every day, with water, >30 minutes before breakfast, separated by >4 hours from acid reflux medications, calcium, iron, multivitamins. Pt. is taking it correctly. - will check thyroid tests today: TSH and fT4 - If labs are abnormal, she will need to return for repeat TFTs in 1.5 months  2. PCOS -She is now at the age of after IUD without heavy menstrual cycles -She had increased facial hair and alopecia so she is on spironolactone 25 mg twice a day.  Latest potassium level was normal: Lab Results  Component Value  Date   K 3.8 10/03/2020  -We cannot increase this further due to previous history of hyperkalemia with higher doses.  3. DM2 -Previously well controlled -On oral medication with metformin and SGLT2 inhibitor -In the past, he had due to muscle cramps and then a labial tear, but these have resolved -In the past, her HbA1c levels calculated from fructosamine were better, these are more accurate for her.   -Latest HbA1c available for review is 8.3%, higher than before -We will check a fructosamine level at this visit. HbA1c was 8.0% (still high).  We will also check a fructosamine level today. -At today's visit, she is interested in a GLP-1 receptor agonist.  She does not have a family history of medullary thyroid cancer or personal history of pancreatitis. -I demonstrated Ozempic pen use -Discussed about benefits and possible side effects with - I suggested: Patient Instructions  Please continue: - Metformin ER 500 mg with breakfast - Jardiance 10 mg before breakfast  Please start: - Ozempic 0.25 mg weekly in a.m. (for example on Sunday morning) x 4 weeks, then increase to 0.5 mg weekly in a.m. if no nausea or hypoglycemia.   Please continue levothyroxine 200 g daily  Take the thyroid hormone every day, with water, at least 30 minutes before breakfast, separated by at least 4 hours from: - acid reflux medications - calcium - iron - multivitamins  Please stop at the lab.  Continue spironolactone 25 mg twice a day.  Please return in 4 months.   4.  Obesity class III -Continues Jardiance and will also add Ozempic which should also help with weight loss -At last visit she was also exercising in the pool and using a low-fat diet -Before last visit, she was doing a partially keto diet with intermittent fasting 16-8 -She gained a significant amount of weight during her COVID infection but now lost some of the weight back -At last visit she gained 12 pounds - 321 lbs >> now 319  (lost 3 lbs net)  Needs all meds refilled to [facility].  Office Visit on 12/06/2020  Component Date Value Ref Range Status   Hemoglobin A1C 12/06/2020 8.0 (A) 4.0 - 5.6 % Final   TSH 12/06/2020 21.97 (A) 0.35 - 4.50 uIU/mL Final   Free T4 12/06/2020 0.70  0.60 - 1.60 ng/dL Final   Comment: Specimens from patients who are undergoing biotin therapy and /or ingesting biotin supplements may contain high levels of biotin.  The higher biotin concentration in these specimens interferes with this Free T4 assay.  Specimens that contain high levels  of  biotin may cause false high results for this Free T4 assay.  Please interpret results in light of the total clinical presentation of the patient.    TSH is even higher. Will increase the LT4 dose back to 225 mcg daily, multivitamin later in the day, and will advise him to crush the LT4 tablets.  Plan to repeat labs in 5 weeks.  Philemon Kingdom, MD PhD [facility] Endocrinology

## 2020-12-06 NOTE — Patient Instructions (Addendum)
Please continue: - Metformin ER 500 mg with breakfast - Jardiance 10 mg before breakfast  Please start: - Ozempic 0.25 mg weekly in a.m. (for example on Sunday morning) x 4 weeks, then increase to 0.5 mg weekly in a.m. if no nausea or hypoglycemia.   Please continue levothyroxine 200 g daily  Take the thyroid hormone every day, with water, at least 30 minutes before breakfast, separated by at least 4 hours from: - acid reflux medications - calcium - iron - multivitamins  Please stop at the lab.  Continue spironolactone 25 mg twice a day.  Please return in 4 months.

## 2020-12-07 ENCOUNTER — Other Ambulatory Visit: Payer: Self-pay | Admitting: Internal Medicine

## 2020-12-07 MED ORDER — SPIRONOLACTONE 25 MG PO TABS: 1.0000 | ORAL_TABLET | Freq: Two times a day (BID) | ORAL | 3 refills | Status: DC

## 2020-12-07 MED ORDER — EMPAGLIFLOZIN 10 MG PO TABS: 10.0000 mg | ORAL_TABLET | Freq: Every day | ORAL | 3 refills | Status: DC

## 2020-12-07 MED ORDER — LEVOTHYROXINE SODIUM 200 MCG PO TABS: ORAL_TABLET | ORAL | 3 refills | Status: DC

## 2020-12-07 MED ORDER — METFORMIN HCL ER 500 MG PO TB24: 500.0000 mg | ORAL_TABLET | Freq: Every day | ORAL | 3 refills | Status: DC

## 2020-12-07 MED ORDER — LEVOTHYROXINE SODIUM 25 MCG PO TABS: 25.0000 ug | ORAL_TABLET | Freq: Every day | ORAL | 3 refills | Status: DC

## 2020-12-14 ENCOUNTER — Encounter (HOSPITAL_BASED_OUTPATIENT_CLINIC_OR_DEPARTMENT_OTHER): Payer: Medicare Other | Attending: Physician Assistant | Admitting: Internal Medicine

## 2020-12-14 ENCOUNTER — Other Ambulatory Visit: Payer: Self-pay

## 2020-12-14 DIAGNOSIS — I509 Heart failure, unspecified: Secondary | ICD-10-CM | POA: Insufficient documentation

## 2020-12-14 DIAGNOSIS — L97818 Non-pressure chronic ulcer of other part of right lower leg with other specified severity: Secondary | ICD-10-CM | POA: Diagnosis not present

## 2020-12-14 DIAGNOSIS — L97212 Non-pressure chronic ulcer of right calf with fat layer exposed: Secondary | ICD-10-CM | POA: Diagnosis not present

## 2020-12-14 DIAGNOSIS — L97812 Non-pressure chronic ulcer of other part of right lower leg with fat layer exposed: Secondary | ICD-10-CM | POA: Diagnosis not present

## 2020-12-14 DIAGNOSIS — I110 Hypertensive heart disease with heart failure: Secondary | ICD-10-CM | POA: Insufficient documentation

## 2020-12-14 DIAGNOSIS — E11622 Type 2 diabetes mellitus with other skin ulcer: Secondary | ICD-10-CM | POA: Diagnosis not present

## 2020-12-14 DIAGNOSIS — E1140 Type 2 diabetes mellitus with diabetic neuropathy, unspecified: Secondary | ICD-10-CM | POA: Diagnosis not present

## 2020-12-14 NOTE — Progress Notes (Signed)
Autumn Massey, Autumn Massey ([id number]) Visit Report for 12/14/2020 Chief Complaint Document Details Patient Name: Date of Service: Autumn Massey NDA D. 12/14/2020 2:45 PM Medical Record Number: [id number] Patient Account Number: [id number] Date of Birth/Sex: Treating RN: [birth year] (55 y.o. Autumn Massey Primary Care Provider: Cathlean Massey Other Clinician: Referring Provider: Treating Provider/Extender: Autumn Massey in Treatment: 0 Information Obtained from: Patient Chief Complaint 12/14/2020; patient is here for review of the wound on the right lateral lower leg which has been present for roughly 4 months Electronic Signature(s) Signed: 12/14/2020 5:27:23 PM By: Autumn Ham MD Entered By: Autumn Massey on 12/14/2020 16:58:19 -------------------------------------------------------------------------------- HPI Details Patient Name: Date of Service: Autumn Spice, [name] 12/14/2020 2:45 PM Medical Record Number: [id number] Patient Account Number: [id number] Date of Birth/Sex: Treating RN: [birth year] (55 y.o. Autumn Massey Primary Care Provider: Cathlean Massey Other Clinician: Referring Provider: Treating Provider/Extender: Autumn Massey in Treatment: 0 History of Present Illness HPI Description: ADMISSION 12/14/2020 This is a pleasant 55 year old woman who is here for our review of wound on the right lateral calf. She is a type II diabetic. She said this started when she traumatized her leg hitting it on the bed frame in February. This was painful. She was left with open wounds. She initially used topical antibiotics but found this was not happening more recently she has been using a combination of diluted hydrogen peroxide, and antibiotic pads. She has support hose but does not wear them. The patient complains of a lot of pain sometimes waking her up tonight at night with a sharp jabbing discomfort. She has neuropathy but this Autumn not be secondary to  her diabetes rather idiopathic peripheral neuropathy is also listed on her problem list. Past medical history includes type 2 diabetes not on insulin last hemoglobin A1c of 8, COVID in February 2022 with chronic shortness of breath. At 1 point she was listed as having COPD although she thinks this is more asthma, hypothyroidism, hyperlipidemia, nighttime oxygen, essential tremor hearing loss and hypertension. She had a lower extremity DVT study on 10/03/2020 and although it was a limited study because of pannus folds over her upper thighs there was no specific evidence of DVT on the right side. ABI in our clinic on the right was 0.94 Electronic Signature(s) Signed: 12/14/2020 5:27:23 PM By: Autumn Ham MD Entered By: Autumn Massey on 12/14/2020 17:19:39 -------------------------------------------------------------------------------- Physical Exam Details Patient Name: Date of Service: Autumn Spice, [name] 12/14/2020 2:45 PM Medical Record Number: [id number] Patient Account Number: [id number] Date of Birth/Sex: Treating RN: [birth year] (55 y.o. Autumn Massey Primary Care Provider: Cathlean Massey Other Clinician: Referring Provider: Treating Provider/Extender: Autumn Massey in Treatment: 0 Constitutional Patient is hypertensive.. Pulse regular and within target range for patient.[name] Respirations regular, non-labored and within target range.. Temperature is normal and within the target range for the patient.[name] Appears in no distress. Respiratory work of breathing is normal. Cardiovascular Pedal pulses are palpable. Skin changes of chronic venous insufficiency with stasis dermatitis right greater than left. Edema in the right leg more than the left as well. Notes Wound exam; right lateral calf superficial wound area with several small longitudinal areas. Most remarkably however the skin around this area is markedly inflamed very warm and tender. Not convincingly cellulitis.  She is lost surface epithelium however Electronic Signature(s) Signed: 12/14/2020 5:27:23 PM By: Autumn Ham MD Entered By: Autumn Massey on 12/14/2020 17:05:13 -------------------------------------------------------------------------------- Physician Orders Details Patient Name: Date of Service: Autumn Massey  NDA D. 12/14/2020 2:45 PM Medical Record Number: [id number] Patient Account Number: [id number] Date of Birth/Sex: Treating RN: Autumn Massey (55 y.o. Autumn Massey Primary Care Provider: Cathlean Massey Other Clinician: Referring Provider: Treating Provider/Extender: Autumn Massey in Treatment: 0 Verbal / Phone Orders: No Diagnosis Coding Follow-up Appointments ppointment in 1 week. - with Dr. Dellia Massey Return A Bathing/ Shower/ Hygiene Autumn shower with protection but do not get wound dressing(s) wet. Edema Control - Lymphedema / SCD / Other Elevate legs to the level of the heart or above for 30 minutes daily and/or when sitting, a frequency of: Avoid standing for long periods of time. Additional Orders / Instructions Follow Nutritious Diet - Increase protein, 100-120g of protein. Other: - Begin taking Doxycycline, prescription sent to your pharmacy. Wound Treatment Wound #1 - Lower Leg Wound Laterality: Right, Lateral Cleanser: Soap and Water 1 x Per Week/30 Days Discharge Instructions: Autumn shower and wash wound with dial antibacterial soap and water prior to dressing change. Cleanser: Wound Cleanser 1 x Per Week/30 Days Discharge Instructions: Cleanse the wound with wound cleanser prior to applying a clean dressing using gauze sponges, not tissue or cotton balls. Peri-Wound Care: Triamcinolone 15 (g) 1 x Per Week/30 Days Discharge Instructions: Apply liberally to red areas on leg Prim Dressing: KerraCel Ag Gelling Fiber Dressing, 4x5 in (silver alginate) 1 x Per Week/30 Days ary Discharge Instructions: Apply silver alginate to wound bed as  instructed Secondary Dressing: Woven Gauze Sponge, Non-Sterile 4x4 in 1 x Per Week/30 Days Discharge Instructions: Apply over primary dressing as directed. Secondary Dressing: ABD Pad, 5x9 1 x Per Week/30 Days Discharge Instructions: Apply over primary dressing as directed. Compression Wrap: ThreePress (3 layer compression wrap) 1 x Per Week/30 Days Discharge Instructions: Apply three layer compression as directed. Wound #2 - Lower Leg Wound Laterality: Right, Lateral, Posterior Cleanser: Soap and Water 1 x Per Week/30 Days Discharge Instructions: Autumn shower and wash wound with dial antibacterial soap and water prior to dressing change. Cleanser: Wound Cleanser 1 x Per Week/30 Days Discharge Instructions: Cleanse the wound with wound cleanser prior to applying a clean dressing using gauze sponges, not tissue or cotton balls. Peri-Wound Care: Triamcinolone 15 (g) 1 x Per Week/30 Days Discharge Instructions: Apply liberally to red areas on leg Prim Dressing: KerraCel Ag Gelling Fiber Dressing, 4x5 in (silver alginate) 1 x Per Week/30 Days ary Discharge Instructions: Apply silver alginate to wound bed as instructed Secondary Dressing: Woven Gauze Sponge, Non-Sterile 4x4 in 1 x Per Week/30 Days Discharge Instructions: Apply over primary dressing as directed. Secondary Dressing: ABD Pad, 5x9 1 x Per Week/30 Days Discharge Instructions: Apply over primary dressing as directed. Compression Wrap: ThreePress (3 layer compression wrap) 1 x Per Week/30 Days Discharge Instructions: Apply three layer compression as directed. Patient Medications llergies: Cymbalta A Notifications Medication Indication Start End wound infectionh 12/14/2020 doxycycline monohydrate DOSE oral 100 mg capsule - 1 capsule oral bid for 5 days Electronic Signature(s) Signed: 12/14/2020 4:46:18 PM By: Autumn Ham MD Entered By: Autumn Massey on 12/14/2020  16:46:17 -------------------------------------------------------------------------------- Problem List Details Patient Name: Date of Service: Autumn Massey NDA D. 12/14/2020 2:45 PM Medical Record Number: [id number] Patient Account Number: [id number] Date of Birth/Sex: Treating RN: [birth year] Massey (55 y.o. Autumn Massey Primary Care Provider: Cathlean Massey Other Clinician: Referring Provider: Treating Provider/Extender: Autumn Massey in Treatment: 0 Active Problems ICD-10 Encounter Code Description Active Date MDM Diagnosis I87.321 Chronic venous hypertension (idiopathic) with inflammation of right lower 12/14/2020 No  Yes extremity L97.818 Non-pressure chronic ulcer of other part of right lower leg with other specified 12/14/2020 No Yes severity L03.115 Cellulitis of right lower limb 12/14/2020 No Yes L24.5 Irritant contact dermatitis due to other chemical products 12/14/2020 No Yes Inactive Problems Resolved Problems Electronic Signature(s) Signed: 12/14/2020 5:27:23 PM By: Autumn Ham MD Entered By: Autumn Massey on 12/14/2020 16:44:22 -------------------------------------------------------------------------------- Progress Note Details Patient Name: Date of Service: Autumn Spice, [name] 12/14/2020 2:45 PM Medical Record Number: [id number] Patient Account Number: [id number] Date of Birth/Sex: Treating RN: [birth year]Massey (55 y.o. Autumn Massey Primary Care Provider: Cathlean Massey Other Clinician: Referring Provider: Treating Provider/Extender: Autumn Massey in Treatment: 0 Subjective Chief Complaint Information obtained from Patient 12/14/2020; patient is here for review of the wound on the right lateral lower leg which has been present for roughly 4 months History of Present Illness (HPI) ADMISSION 12/14/2020 This is a pleasant 55 year old woman who is here for our review of wound on the right lateral calf. She is a type II diabetic. She  said this started when she traumatized her leg hitting it on the bed frame in February. This was painful. She was left with open wounds. She initially used topical antibiotics but found this was not happening more recently she has been using a combination of diluted hydrogen peroxide, and antibiotic pads. She has support hose but does not wear them. The patient complains of a lot of pain sometimes waking her up tonight at night with a sharp jabbing discomfort. She has neuropathy but this Autumn not be secondary to her diabetes rather idiopathic peripheral neuropathy is also listed on her problem list. Past medical history includes type 2 diabetes not on insulin last hemoglobin A1c of 8, COVID in February 2022 with chronic shortness of breath. At 1 point she was listed as having COPD although she thinks this is more asthma, hypothyroidism, hyperlipidemia, nighttime oxygen, essential tremor hearing loss and hypertension ABI in our clinic on the right was 0.94 Patient History Information obtained from Patient. Allergies Cymbalta (Reaction: unknown) Family History Cancer - Mother, Heart Disease - Mother, Hypertension - Mother,Maternal Grandparents, Lung Disease - Mother, Stroke - Mother, Thyroid Problems - Maternal Grandparents, No family history of Diabetes, Kidney Disease, Seizures, Tuberculosis. Social History Former smoker - quit 5 yrs ago, Marital Status - Divorced, Alcohol Use - Rarely, Drug Use - No History, Caffeine Use - Rarely. Medical History Eyes Denies history of Cataracts, Glaucoma, Optic Neuritis Ear/Nose/Mouth/Throat Denies history of Chronic sinus problems/congestion, Middle ear problems Respiratory Patient has history of Asthma Cardiovascular Patient has history of Congestive Heart Failure, Hypertension Denies history of Peripheral Arterial Disease, Peripheral Venous Disease, Phlebitis, Vasculitis Endocrine Patient has history of Type II Diabetes Denies history of Type I  Diabetes Integumentary (Skin) Denies history of History of Burn Musculoskeletal Patient has history of Osteoarthritis Denies history of Gout, Rheumatoid Arthritis, Osteomyelitis Neurologic Patient has history of Neuropathy Oncologic Denies history of Received Chemotherapy, Received Radiation Psychiatric Denies history of Anorexia/bulimia, Confinement Anxiety Patient is treated with Oral Agents. Blood sugar is tested. Hospitalization/Surgery History - DandC/hysterescopy. - tonsillectomy. - ulnar tunnel release. Medical A Surgical History Notes nd Constitutional Symptoms (General Health) morbid obesity Ear/Nose/Mouth/Throat hearing impaired Hematologic/Lymphatic polycythemia Respiratory O2 dependent at night Cardiovascular hyperlipidemia Endocrine hypothyroidism Neurologic tremors Review of Systems (ROS) Constitutional Symptoms (General Health) Denies complaints or symptoms of Fatigue, Fever, Chills, Marked Weight Change. Eyes Complains or has symptoms of Glasses / Contacts. Denies complaints or symptoms of Dry Eyes. Ear/Nose/Mouth/Throat Denies  complaints or symptoms of Chronic sinus problems or rhinitis. Respiratory Complains or has symptoms of Shortness of Breath. Denies complaints or symptoms of Chronic or frequent coughs. Cardiovascular Denies complaints or symptoms of Chest pain. Gastrointestinal Complains or has symptoms of Nausea. Denies complaints or symptoms of Frequent diarrhea, Vomiting. Endocrine Denies complaints or symptoms of Heat/cold intolerance. Genitourinary Denies complaints or symptoms of Frequent urination. Integumentary (Skin) Complains or has symptoms of Wounds - right lower leg. Musculoskeletal Denies complaints or symptoms of Muscle Pain, Muscle Weakness. Neurologic Denies complaints or symptoms of Numbness/parasthesias. Psychiatric Denies complaints or symptoms of Claustrophobia, Suicidal. Objective Constitutional Patient is  hypertensive.. Pulse regular and within target range for patient.[name] Respirations regular, non-labored and within target range.. Temperature is normal and within the target range for the patient.[name] Appears in no distress. Vitals Time Taken: 3:35 PM, Height: 62 in, Source: Stated, Weight: 319 lbs, Source: Stated, BMI: 58.3, Temperature: 98.7 F, Pulse: 83 bpm, Respiratory Rate: 22 breaths/min, Blood Pressure: 145/94 mmHg, Capillary Blood Glucose: 157 mg/dl. General Notes: glucose per pt report yesterday Respiratory work of breathing is normal. Cardiovascular Pedal pulses are palpable. Skin changes of chronic venous insufficiency with stasis dermatitis right greater than left. Edema in the right leg more than the left as well. General Notes: Wound exam; right lateral calf superficial wound area with several small longitudinal areas. Most remarkably however the skin around this area is markedly inflamed very warm and tender. Not convincingly cellulitis. She is lost surface epithelium however Integumentary (Hair, Skin) Wound #1 status is Open. Original cause of wound was Trauma. The date acquired was: 08/09/2020. The wound is located on the Right,Lateral Lower Leg. The wound measures 1.8cm length x 0.8cm width x 0.1cm depth; 1.131cm^2 area and 0.113cm^3 volume. There is Fat Layer (Subcutaneous Tissue) exposed. There is no tunneling or undermining noted. There is a medium amount of serosanguineous drainage noted. The wound margin is flat and intact. There is large (67- 100%) red granulation within the wound bed. There is a small (1-33%) amount of necrotic tissue within the wound bed including Adherent Slough. Wound #2 status is Open. Original cause of wound was Trauma. The date acquired was: 08/09/2020. The wound is located on the Right,Lateral,Posterior Lower Leg. The wound measures 5.5cm length x 2.3cm width x 0.1cm depth; 9.935cm^2 area and 0.994cm^3 volume. There is Fat Layer (Subcutaneous  Tissue) exposed. There is no tunneling or undermining noted. There is a medium amount of serosanguineous drainage noted. The wound margin is flat and intact. There is large (67-100%) red granulation within the wound bed. There is a small (1-33%) amount of necrotic tissue within the wound bed including Adherent Slough. Assessment Active Problems ICD-10 Chronic venous hypertension (idiopathic) with inflammation of right lower extremity Non-pressure chronic ulcer of other part of right lower leg with other specified severity Cellulitis of right lower limb Irritant contact dermatitis due to other chemical products Procedures Wound #1 Pre-procedure diagnosis of Wound #1 is a Diabetic Wound/Ulcer of the Lower Extremity located on the Right,Lateral Lower Leg . There was a Three Layer Compression Therapy Procedure by Lorrin Jackson, RN. Post procedure Diagnosis Wound #1: Same as Pre-Procedure Wound #2 Pre-procedure diagnosis of Wound #2 is a Diabetic Wound/Ulcer of the Lower Extremity located on the Right,Lateral,Posterior Lower Leg . There was a Three Layer Compression Therapy Procedure by Lorrin Jackson, RN. Post procedure Diagnosis Wound #2: Same as Pre-Procedure Plan Follow-up Appointments: Return Appointment in 1 week. - with Dr. Arcola Jansky Shower/ Hygiene: Autumn shower with protection  but do not get wound dressing(s) wet. Edema Control - Lymphedema / SCD / Other: Elevate legs to the level of the heart or above for 30 minutes daily and/or when sitting, a frequency of: Avoid standing for long periods of time. Additional Orders / Instructions: Follow Nutritious Diet - Increase protein, 100-120g of protein. Other: - Begin taking Doxycycline, prescription sent to your pharmacy. The following medication(s) was prescribed: doxycycline monohydrate oral 100 mg capsule 1 capsule oral bid for 5 days for wound infectionh starting 12/14/2020 WOUND #1: - Lower Leg Wound Laterality: Right,  Lateral Cleanser: Soap and Water 1 x Per Week/30 Days Discharge Instructions: Autumn shower and wash wound with dial antibacterial soap and water prior to dressing change. Cleanser: Wound Cleanser 1 x Per Week/30 Days Discharge Instructions: Cleanse the wound with wound cleanser prior to applying a clean dressing using gauze sponges, not tissue or cotton balls. Peri-Wound Care: Triamcinolone 15 (g) 1 x Per Week/30 Days Discharge Instructions: Apply liberally to red areas on leg Prim Dressing: KerraCel Ag Gelling Fiber Dressing, 4x5 in (silver alginate) 1 x Per Week/30 Days ary Discharge Instructions: Apply silver alginate to wound bed as instructed Secondary Dressing: Woven Gauze Sponge, Non-Sterile 4x4 in 1 x Per Week/30 Days Discharge Instructions: Apply over primary dressing as directed. Secondary Dressing: ABD Pad, 5x9 1 x Per Week/30 Days Discharge Instructions: Apply over primary dressing as directed. Com pression Wrap: ThreePress (3 layer compression wrap) 1 x Per Week/30 Days Discharge Instructions: Apply three layer compression as directed. WOUND #2: - Lower Leg Wound Laterality: Right, Lateral, Posterior Cleanser: Soap and Water 1 x Per Week/30 Days Discharge Instructions: Autumn shower and wash wound with dial antibacterial soap and water prior to dressing change. Cleanser: Wound Cleanser 1 x Per Week/30 Days Discharge Instructions: Cleanse the wound with wound cleanser prior to applying a clean dressing using gauze sponges, not tissue or cotton balls. Peri-Wound Care: Triamcinolone 15 (g) 1 x Per Week/30 Days Discharge Instructions: Apply liberally to red areas on leg Prim Dressing: KerraCel Ag Gelling Fiber Dressing, 4x5 in (silver alginate) 1 x Per Week/30 Days ary Discharge Instructions: Apply silver alginate to wound bed as instructed Secondary Dressing: Woven Gauze Sponge, Non-Sterile 4x4 in 1 x Per Week/30 Days Discharge Instructions: Apply over primary dressing as  directed. Secondary Dressing: ABD Pad, 5x9 1 x Per Week/30 Days Discharge Instructions: Apply over primary dressing as directed. Com pression Wrap: ThreePress (3 layer compression wrap) 1 x Per Week/30 Days Discharge Instructions: Apply three layer compression as directed. 1. Traumatic wounds in the setting of chronic venous insufficiency 2. The nonhealing status is secondary to chronic edema in the area as well as inflammation in the skin around this. 3. Very warm and tender I think this either could be; chronic contact dermatitis probably from the hydrogen peroxide, stasis dermatitis and perhaps cellulitis although I put this in the exact order. 4. Because of the uncertainty of putting this under compression vis--vis the cellulitis possibility I am going to put her on doxycycline twice a day for 5 days 5. We put her in 3 layer compression although I think from an arterial point of view she could stand for easily I wanted her to be comfortable in the wraps 6. I have asked her to keep her leg elevated when she is sitting and in this hot weather limit her activity outside. 7. We use silver alginate/ABDs under 3 layer compression Electronic Signature(s) Signed: 12/14/2020 5:27:23 PM By: Autumn Ham MD Entered By: Autumn Massey,  Merle Cirelli on 12/14/2020 17:07:36 -------------------------------------------------------------------------------- HxROS Details Patient Name: Date of Service: STUCKY, Massey NDA D. 12/14/2020 2:45 PM Medical Record Number: [id number] Patient Account Number: [id number] Date of Birth/Sex: Treating RN: [birth year] Massey (55 y.o. [name] Primary Care Provider: Cathlean Massey Other Clinician: Referring Provider: Treating Provider/Extender: Autumn Massey in Treatment: 0 Information Obtained From Patient Constitutional Symptoms (General Health) Complaints and Symptoms: Negative for: Fatigue; Fever; Chills; Marked Weight Change Medical History: Past Medical  History Notes: morbid obesity Eyes Complaints and Symptoms: Positive for: Glasses / Contacts Negative for: Dry Eyes Medical History: Negative for: Cataracts; Glaucoma; Optic Neuritis Ear/Nose/Mouth/Throat Complaints and Symptoms: Negative for: Chronic sinus problems or rhinitis Medical History: Negative for: Chronic sinus problems/congestion; Middle ear problems Past Medical History Notes: hearing impaired Respiratory Complaints and Symptoms: Positive for: Shortness of Breath Negative for: Chronic or frequent coughs Medical History: Positive for: Asthma Past Medical History Notes: O2 dependent at night Cardiovascular Complaints and Symptoms: Negative for: Chest pain Medical History: Positive for: Congestive Heart Failure; Hypertension Negative for: Peripheral Arterial Disease; Peripheral Venous Disease; Phlebitis; Vasculitis Past Medical History Notes: hyperlipidemia Gastrointestinal Complaints and Symptoms: Positive for: Nausea Negative for: Frequent diarrhea; Vomiting Endocrine Complaints and Symptoms: Negative for: Heat/cold intolerance Medical History: Positive for: Type II Diabetes Negative for: Type I Diabetes Past Medical History Notes: hypothyroidism Time with diabetes: 2-4 yrs Treated with: Oral agents Blood sugar tested every day: Yes Tested : 2 times per day Genitourinary Complaints and Symptoms: Negative for: Frequent urination Integumentary (Skin) Complaints and Symptoms: Positive for: Wounds - right lower leg Medical History: Negative for: History of Burn Musculoskeletal Complaints and Symptoms: Negative for: Muscle Pain; Muscle Weakness Medical History: Positive for: Osteoarthritis Negative for: Gout; Rheumatoid Arthritis; Osteomyelitis Neurologic Complaints and Symptoms: Negative for: Numbness/parasthesias Medical History: Positive for: Neuropathy Past Medical History Notes: tremors Psychiatric Complaints and Symptoms: Negative  for: Claustrophobia; Suicidal Medical History: Negative for: Anorexia/bulimia; Confinement Anxiety Hematologic/Lymphatic Medical History: Past Medical History Notes: polycythemia Immunological Oncologic Medical History: Negative for: Received Chemotherapy; Received Radiation Immunizations Pneumococcal Vaccine: Received Pneumococcal Vaccination: Yes Implantable Devices No devices added Hospitalization / Surgery History Type of Hospitalization/Surgery DandC/hysterescopy tonsillectomy ulnar tunnel release Family and Social History Cancer: Yes - Mother; Diabetes: No; Heart Disease: Yes - Mother; Hypertension: Yes - Mother,Maternal Grandparents; Kidney Disease: No; Lung Disease: Yes - Mother; Seizures: No; Stroke: Yes - Mother; Thyroid Problems: Yes - Maternal Grandparents; Tuberculosis: No; Former smoker - quit 5 yrs ago; Marital Status - Divorced; Alcohol Use: Rarely; Drug Use: No History; Caffeine Use: Rarely; Financial Concerns: No; Food, Clothing or Shelter Needs: No; Support System Lacking: No; Transportation Concerns: No Engineer, maintenance) Signed: 12/14/2020 5:27:23 PM By: Autumn Ham MD Signed: 12/14/2020 5:33:29 PM By: Baruch Gouty RN, BSN Entered By: Baruch Gouty on 12/14/2020 15:48:04 -------------------------------------------------------------------------------- [place] Details Patient Name: Date of Service: Autumn Spice, [name] 12/14/2020 Medical Record Number: [id number] Patient Account Number: [id number] Date of Birth/Sex: Treating RN: [birth year] (54 y.o. Autumn Massey Primary Care Provider: Cathlean Massey Other Clinician: Referring Provider: Treating Provider/Extender: Autumn Massey in Treatment: 0 Diagnosis Coding ICD-10 Codes Code Description [phone] Chronic venous hypertension (idiopathic) with inflammation of right lower extremity L97.818 Non-pressure chronic ulcer of other part of right lower leg with other specified  severity L03.115 Cellulitis of right lower limb L24.5 Irritant contact dermatitis due to other chemical products Facility Procedures CPT4 Code: 91694503 Description: 99214 - WOUND CARE VISIT-LEV 4 EST PT Modifier: 25 Quantity: 1 Physician Procedures : CPT4 Code  Description Modifier [id number] - WC PHYS LEVEL 4 - NEW PT 25 1 ICD-10 Diagnosis Description L97.818 Non-pressure chronic ulcer of other part of right lower leg with other specified severity I87.321 Chronic venous hypertension (idiopathic)  with inflammation of right lower extremity L03.115 Cellulitis of right lower limb L24.5 Irritant contact dermatitis due to other chemical products Quantity: Electronic Signature(s) Signed: 12/14/2020 5:13:46 PM By: Lorrin Jackson Signed: 12/14/2020 5:27:23 PM By: Autumn Ham MD Entered By: Lorrin Jackson on 12/14/2020 17:13:46

## 2020-12-14 NOTE — Progress Notes (Signed)
Autumn Massey ([id number]) Visit Report for 12/14/2020 Abuse/Suicide Risk Screen Details Patient Name: Date of Service: Autumn Massey Autumn D. 12/14/2020 2:45 PM Medical Record Number: [id number] Patient Account Number: [id number] Date of Birth/Sex: Treating RN: [birth year] (55 y.o. [name] Primary Care Mimi Debellis: [name] Other Clinician: Referring Riyan Gavina: Treating Armanii Urbanik/Extender: [name] in Treatment: 0 Abuse/Suicide Risk Screen Items Answer ABUSE RISK SCREEN: Has anyone close to you tried to hurt or harm you recentlyo No Do you feel uncomfortable with anyone in your familyo No Has anyone forced you do things that you didnt want to doo No Electronic Signature(s) Signed: 12/14/2020 5:33:29 PM By: Baruch Gouty RN, BSN Entered By: Baruch Gouty on 12/14/2020 15:48:17 -------------------------------------------------------------------------------- Activities of Daily Living Details Patient Name: Date of Service: Autumn Corners, Massey Autumn D. 12/14/2020 2:45 PM Medical Record Number: [id number] Patient Account Number: [id number] Date of Birth/Sex: Treating RN: 12/29/1965 (55 y.o. [name] Primary Care Iwao Shamblin: [name] Other Clinician: Referring Sulaiman Imbert: Treating Hammond Obeirne/Extender: [name] in Treatment: 0 Activities of Daily Living Items Answer Activities of Daily Living (Please select one for each item) Drive Automobile Completely Able T Medications ake Completely Able Use T elephone Completely Able Care for Appearance Completely Able Use T oilet Completely Able Bath / Shower Completely Able Dress Self Completely Able Feed Self Completely Able Walk Completely Able Get In / Out Bed Completely Able Housework Completely Able Prepare Meals Completely Able Handle Money Completely Able Shop for Self Completely Able Electronic Signature(s) Signed: 12/14/2020 5:33:29 PM By: Baruch Gouty RN,  BSN Entered By: Baruch Gouty on 12/14/2020 15:48:43 -------------------------------------------------------------------------------- Education Screening Details Patient Name: Date of Service: Autumn Spice, RO Autumn D. 12/14/2020 2:45 PM Medical Record Number: [id number] Patient Account Number: [id number] Date of Birth/Sex: Treating RN: [birth year] (55 y.o. [name] Primary Care Rehaan Viloria: [name] Other Clinician: Referring Duong Haydel: Treating Nicolas Sisler/Extender: [name] in Treatment: 0 Primary Learner Assessed: Patient Learning Preferences/Education Level/Primary Language Learning Preference: Explanation, Demonstration, Printed Material Highest Education Level: High School Preferred Language: English Cognitive Barrier Language Barrier: No Translator Needed: No Memory Deficit: No Emotional Barrier: No Cultural/Religious Beliefs Affecting Medical Care: No Physical Barrier Impaired Vision: Yes Glasses Impaired Hearing: No Decreased Hand dexterity: No Knowledge/Comprehension Knowledge Level: High Comprehension Level: High Ability to understand written instructions: High Ability to understand verbal instructions: High Motivation Anxiety Level: Calm Cooperation: Cooperative Education Importance: Acknowledges Need Interest in Health Problems: Asks Questions Perception: Coherent Willingness to Engage in Self-Management High Activities: Readiness to Engage in Self-Management High Activities: Electronic Signature(s) Signed: 12/14/2020 5:33:29 PM By: Baruch Gouty RN, BSN Entered By: Baruch Gouty on 12/14/2020 15:49:24 -------------------------------------------------------------------------------- Fall Risk Assessment Details Patient Name: Date of Service: Autumn Spice, RO Autumn D. 12/14/2020 2:45 PM Medical Record Number: [id number] Patient Account Number: [id number] Date of Birth/Sex: Treating RN: 1965/11/24 (55 y.o. [name] Primary Care Anjolie Majer: [name] Other Clinician: Referring Heydi Swango: Treating Malena Timpone/Extender: [name] in Treatment: 0 Fall Risk Assessment Items Have you had 2 or more falls in the last 12 monthso 0 No Have you had any fall that resulted in injury in the last 12 monthso 0 No FALLS RISK SCREEN History of falling - immediate or within 3 months 0 No Secondary diagnosis (Do you have 2 or more medical diagnoseso) 0 No Ambulatory aid None/bed rest/wheelchair/nurse 0 Yes Crutches/cane/walker 0 No Furniture 0 No Intravenous therapy Access/Saline/Heparin Lock 0 No Gait/Transferring Normal/ bed rest/ wheelchair 0 Yes Weak (short steps  with or without shuffle, stooped but able to lift head while walking, may seek 0 No support from furniture) Impaired (short steps with shuffle, may have difficulty arising from chair, head down, impaired 0 No balance) Mental Status Oriented to own ability 0 Yes Electronic Signature(s) Signed: 12/14/2020 5:33:29 PM By: Baruch Gouty RN, BSN Entered By: Baruch Gouty on 12/14/2020 15:49:35 -------------------------------------------------------------------------------- Foot Assessment Details Patient Name: Date of Service: Autumn Spice, RO Autumn D. 12/14/2020 2:45 PM Medical Record Number: [id number] Patient Account Number: [id number] Date of Birth/Sex: Treating RN: [birth year] (55 y.o. [name] Primary Care Valita Righter: [name] Other Clinician: Referring Charise Leinbach: Treating Euretha Najarro/Extender: [name] in Treatment: 0 Foot Assessment Items Site Locations + = Sensation present, - = Sensation absent, C = Callus, U = Ulcer R = Redness, W = Warmth, M = Maceration, PU = Pre-ulcerative lesion F = Fissure, S = Swelling, D = Dryness Assessment Right: Left: Other Deformity: No No Prior Foot Ulcer: No No Prior Amputation: No No Charcot Joint: No No Ambulatory Status: Ambulatory Without  Help Gait: Steady Electronic Signature(s) Signed: 12/14/2020 5:33:29 PM By: Baruch Gouty RN, BSN Entered By: Baruch Gouty on 12/14/2020 15:50:28 -------------------------------------------------------------------------------- Nutrition Risk Screening Details Patient Name: Date of Service: Autumn Spice, RO Autumn D. 12/14/2020 2:45 PM Medical Record Number: [id number] Patient Account Number: [id number] Date of Birth/Sex: Treating RN: 03-04-1966 (55 y.o. [name] Primary Care Sharena Dibenedetto: [name] Other Clinician: Referring Brizeida Mcmurry: Treating Courteny Egler/Extender: [name] in Treatment: 0 Height (in): 62 Weight (lbs): 319 Body Mass Index (BMI): 58.3 Nutrition Risk Screening Items Score Screening NUTRITION RISK SCREEN: I have an illness or condition that made me change the kind and/or amount of food I eat 0 No I eat fewer than two meals per day 0 No I eat few fruits and vegetables, or milk products 0 No I have three or more drinks of beer, liquor or wine almost every day 0 No I have tooth or mouth problems that make it hard for me to eat 0 No I don't always have enough money to buy the food I need 0 No I eat alone most of the time 1 Yes I take three or more different prescribed or over-the-counter drugs a day 1 Yes Without wanting to, I have lost or gained 10 pounds in the last six months 0 No I am not always physically able to shop, cook and/or feed myself 0 No Nutrition Protocols Good Risk Protocol 0 No interventions needed Moderate Risk Protocol High Risk Proctocol Risk Level: Good Risk Score: 2 Electronic Signature(s) Signed: 12/14/2020 5:33:29 PM By: Baruch Gouty RN, BSN Entered By: Baruch Gouty on 12/14/2020 15:50:13

## 2020-12-14 NOTE — Progress Notes (Addendum)
Autumn Massey, Autumn Massey ([id number]) Visit Report for 12/14/2020 Allergy List Details Patient Name: Date of Service: Autumn Massey, Autumn NDA D. 12/14/2020 2:45 PM Medical Record Number: [id number] Patient Account Number: [id number] Date of Birth/Sex: Treating RN: 1966-04-05 (55 y.o. Female) Baruch Gouty Primary Care Asuncion Tapscott: [name] Other Clinician: Referring Ayham Word: Treating Brentyn Seehafer/Extender: [name] in Treatment: 0 Allergies Active Allergies Cymbalta Reaction: unknown Allergy Notes Electronic Signature(s) Signed: 12/14/2020 5:33:29 PM By: Baruch Gouty RN, BSN Entered By: Baruch Gouty on 12/14/2020 15:21:01 -------------------------------------------------------------------------------- Arrival Information Details Patient Name: Date of Service: Autumn Massey, Autumn NDA D. 12/14/2020 2:45 PM Medical Record Number: [id number] Patient Account Number: [id number] Date of Birth/Sex: Treating RN: 1966-04-01 (55 y.o. Female) Baruch Gouty Primary Care Momin Misko: [name] Other Clinician: Referring Asael Pann: Treating Aizley Stenseth/Extender: [name] in Treatment: 0 Visit Information Patient Arrived: Ambulatory Arrival Time: 15:34 Accompanied By: self Transfer Assistance: None Patient Identification Verified: Yes Secondary Verification Process Completed: Yes Patient Requires Transmission-Based Precautions: No Patient Has Alerts: No Electronic Signature(s) Signed: 12/14/2020 5:33:29 PM By: Baruch Gouty RN, BSN Entered By: Baruch Gouty on 12/14/2020 15:35:29 -------------------------------------------------------------------------------- Clinic Level of Care Assessment Details Patient Name: Date of Service: Autumn Massey, Autumn NDA D. 12/14/2020 2:45 PM Medical Record Number: [id number] Patient Account Number: [id number] Date of Birth/Sex: Treating RN: [birth year] (55 y.o. Female) Lorrin Jackson Primary Care Isma Tietje: [name] Other  Clinician: Referring Acire Tang: Treating Michaelene Dutan/Extender: [name] in Treatment: 0 Clinic Level of Care Assessment Items TOOL 2 Quantity Score X- 1 0 Use when only an EandM is performed on the INITIAL visit ASSESSMENTS - Nursing Assessment / Reassessment X- 1 20 General Physical Exam (combine w/ comprehensive assessment (listed just below) when performed on new pt. evals) X- 1 25 Comprehensive Assessment (HX, ROS, Risk Assessments, Wounds Hx, etc.) ASSESSMENTS - Wound and Skin A ssessment / Reassessment []  - 0 Simple Wound Assessment / Reassessment - one wound X- 2 5 Complex Wound Assessment / Reassessment - multiple wounds []  - 0 Dermatologic / Skin Assessment (not related to wound area) ASSESSMENTS - Ostomy and/or Continence Assessment and Care []  - 0 Incontinence Assessment and Management []  - 0 Ostomy Care Assessment and Management (repouching, etc.) PROCESS - Coordination of Care []  - 0 Simple Patient / Family Education for ongoing care X- 1 20 Complex (extensive) Patient / Family Education for ongoing care X- 1 10 Staff obtains Programmer, systems, Records, T Results / Process Orders est []  - 0 Staff telephones HHA, Nursing Homes / Clarify orders / etc []  - 0 Routine Transfer to another Facility (non-emergent condition) []  - 0 Routine Hospital Admission (non-emergent condition) []  - 0 New Admissions / Biomedical engineer / Ordering NPWT Apligraf, etc. , []  - 0 Emergency Hospital Admission (emergent condition) []  - 0 Simple Discharge Coordination []  - 0 Complex (extensive) Discharge Coordination PROCESS - Special Needs []  - 0 Pediatric / Minor Patient Management []  - 0 Isolation Patient Management []  - 0 Hearing / Language / Visual special needs []  - 0 Assessment of Community assistance (transportation, D/C planning, etc.) []  - 0 Additional assistance / Altered mentation []  - 0 Support Surface(s) Assessment (bed, cushion, seat,  etc.) INTERVENTIONS - Wound Cleansing / Measurement X- 1 5 Wound Imaging (photographs - any number of wounds) []  - 0 Wound Tracing (instead of photographs) []  - 0 Simple Wound Measurement - one wound X- 2 5 Complex Wound Measurement - multiple wounds []  - 0 Simple Wound Cleansing - one wound X- 2 5 Complex Wound  Cleansing - multiple wounds INTERVENTIONS - Wound Dressings []  - 0 Small Wound Dressing one or multiple wounds []  - 0 Medium Wound Dressing one or multiple wounds X- 1 20 Large Wound Dressing one or multiple wounds []  - 0 Application of Medications - injection INTERVENTIONS - Miscellaneous []  - 0 External ear exam []  - 0 Specimen Collection (cultures, biopsies, blood, body fluids, etc.) []  - 0 Specimen(s) / Culture(s) sent or taken to Lab for analysis []  - 0 Patient Transfer (multiple staff / Civil Service fast streamer / Similar devices) []  - 0 Simple Staple / Suture removal (25 or less) []  - 0 Complex Staple / Suture removal (26 or more) []  - 0 Hypo / Hyperglycemic Management (close monitor of Blood Glucose) X- 1 15 Ankle / Brachial Index (ABI) - do not check if billed separately Has the patient been seen at the hospital within the last three years: Yes Total Score: 145 Level Of Care: New/Established - Level 4 Electronic Signature(s) Signed: 12/14/2020 5:38:00 PM By: Lorrin Jackson Entered By: Lorrin Jackson on 12/14/2020 16:36:49 -------------------------------------------------------------------------------- Compression Therapy Details Patient Name: Date of Service: Autumn Massey, Autumn NDA D. 12/14/2020 2:45 PM Medical Record Number: [id number] Patient Account Number: [id number] Date of Birth/Sex: Treating RN: 01/16/1966 (55 y.o. Female) Lorrin Jackson Primary Care Darris Staiger: [name] Other Clinician: Referring Carles Florea: Treating Ruslan Mccabe/Extender: [name] in Treatment: 0 Compression Therapy Performed for Wound Assessment: Wound #1 Right,Lateral  Lower Leg Performed By: Clinician Lorrin Jackson, RN Compression Type: Three Layer Post Procedure Diagnosis Same as Pre-procedure Electronic Signature(s) Signed: 12/14/2020 5:38:00 PM By: Lorrin Jackson Entered By: Lorrin Jackson on 12/14/2020 16:37:20 -------------------------------------------------------------------------------- Compression Therapy Details Patient Name: Date of Service: Autumn Massey, Autumn NDA D. 12/14/2020 2:45 PM Medical Record Number: [id number] Patient Account Number: [id number] Date of Birth/Sex: Treating RN: 03-Jan-1966 (55 y.o. Female) Lorrin Jackson Primary Care Kyia Rhude: [name] Other Clinician: Referring Shun Pletz: Treating Marissah Vandemark/Extender: [name] in Treatment: 0 Compression Therapy Performed for Wound Assessment: Wound #2 Right,Lateral,Posterior Lower Leg Performed By: Clinician Lorrin Jackson, RN Compression Type: Three Layer Post Procedure Diagnosis Same as Pre-procedure Electronic Signature(s) Signed: 12/14/2020 5:38:00 PM By: Lorrin Jackson Entered By: Lorrin Jackson on 12/14/2020 16:37:20 -------------------------------------------------------------------------------- Encounter Discharge Information Details Patient Name: Date of Service: Autumn Massey, Autumn NDA D. 12/14/2020 2:45 PM Medical Record Number: [id number] Patient Account Number: [id number] Date of Birth/Sex: Treating RN: 30-Apr-1966 (55 y.o. Female) Deon Pilling Primary Care Rogina Schiano: [name] Other Clinician: Referring Virgle Arth: Treating Inis Borneman/Extender: [name] in Treatment: 0 Encounter Discharge Information Items Discharge Condition: Stable Ambulatory Status: Ambulatory Discharge Destination: Home Transportation: Private Auto Accompanied By: mother Schedule Follow-up Appointment: Yes Clinical Summary of Care: Electronic Signature(s) Signed: 12/14/2020 4:58:53 PM By: Deon Pilling Entered By: Deon Pilling on 12/14/2020  16:58:32 -------------------------------------------------------------------------------- Lower Extremity Assessment Details Patient Name: Date of Service: Autumn Massey, Autumn NDA D. 12/14/2020 2:45 PM Medical Record Number: [id number] Patient Account Number: [id number] Date of Birth/Sex: Treating RN: 08-Aug-1965 (55 y.o. Female) Baruch Gouty Primary Care Elim Peale: [name] Other Clinician: Referring Daven Pinckney: Treating Idabelle Mcpeters/Extender: [name] in Treatment: 0 Edema Assessment Assessed: Shirlyn Goltz: No] [Right: No] Edema: [Left: Ye] [Right: s] Calf Left: Right: Point of Measurement: From Medial Instep 45.8 cm Ankle Left: Right: Point of Measurement: From Medial Instep 24.5 cm Vascular Assessment Pulses: Dorsalis Pedis Palpable: [Right:Yes] Blood Pressure: Brachial: Dorsalis Pedis: 132 Ankle: Posterior Tibial: 136 Ankle Brachial Index: Electronic Signature(s) Signed: 12/14/2020 5:33:29 PM By: Baruch Gouty RN, BSN Entered By: Baruch Gouty on  12/14/2020 15:34:42 -------------------------------------------------------------------------------- Multi Wound Chart Details Patient Name: Date of Service: Autumn Massey, Autumn NDA D. 12/14/2020 2:45 PM Medical Record Number: [id number] Patient Account Number: [id number] Date of Birth/Sex: Treating RN: 06-11-1966 (55 y.o. Female) Lorrin Jackson Primary Care Zaydon Kinser: [name] Other Clinician: Referring Danille Oppedisano: Treating Aliayah Tyer/Extender: [name] in Treatment: 0 Vital Signs Height(in): 62 Capillary Blood Glucose(mg/dl): 157 Weight(lbs): 319 Pulse(bpm): 5 Body Mass Index(BMI): 29 Blood Pressure(mmHg): 145/94 Temperature(F): 98.7 Respiratory Rate(breaths/min): 22 Photos: [1:No Photos Right, Lateral Lower Leg] [2:No Photos Right, Lateral, Posterior Lower Leg N/A] [N/A:N/A] Wound Location: [1:Trauma] [2:Trauma] [N/A:N/A] Wounding Event: [1:Diabetic Wound/Ulcer of the Lower]  [2:Diabetic Wound/Ulcer of the Lower] [N/A:N/A] Primary Etiology: [1:Extremity Venous Leg Ulcer] [2:Extremity Venous Leg Ulcer] [N/A:N/A] Secondary Etiology: [1:Asthma, Congestive Heart Failure,] [2:Asthma, Congestive Heart Failure,] [N/A:N/A] Comorbid History: [1:Hypertension, Type II Diabetes, Osteoarthritis, Neuropathy 08/09/2020] [2:Hypertension, Type II Diabetes, Osteoarthritis, Neuropathy 08/09/2020] [N/A:N/A] Date Acquired: [1:0] [2:0] [N/A:N/A] Weeks of Treatment: [1:Open] [2:Open] [N/A:N/A] Wound Status: [1:1.8x0.8x0.1] [2:5.5x2.3x0.1] [N/A:N/A] Measurements L x W x D (cm) [1:1.131] [2:9.935] [N/A:N/A] A (cm) : rea [1:0.113] [2:0.994] [N/A:N/A] Volume (cm) : [1:Grade 1] [2:Grade 1] [N/A:N/A] Classification: [1:Medium] [2:Medium] [N/A:N/A] Exudate A mount: [1:Serosanguineous] [2:Serosanguineous] [N/A:N/A] Exudate Type: [1:red, brown] [2:red, brown] [N/A:N/A] Exudate Color: [1:Flat and Intact] [2:Flat and Intact] [N/A:N/A] Wound Margin: [1:Large (67-100%)] [2:Large (67-100%)] [N/A:N/A] Granulation A mount: [1:Red] [2:Red] [N/A:N/A] Granulation Quality: [1:Small (1-33%)] [2:Small (1-33%)] [N/A:N/A] Necrotic A mount: [1:Fat Layer (Subcutaneous Tissue): Yes Fat Layer (Subcutaneous Tissue): Yes N/A] Exposed Structures: [1:Fascia: No Tendon: No Muscle: No Joint: No Bone: No Small (1-33%)] [2:Fascia: No Tendon: No Muscle: No Joint: No Bone: No Medium (34-66%)] [N/A:N/A] Epithelialization: [1:Compression Therapy] [2:Compression Therapy] [N/A:N/A] Treatment Notes Electronic Signature(s) Signed: 12/14/2020 5:27:23 PM By: Linton Ham MD Signed: 12/14/2020 5:38:00 PM By: Lorrin Jackson Entered By: Linton Ham on 12/14/2020 16:57:52 -------------------------------------------------------------------------------- Multi-Disciplinary Care Plan Details Patient Name: Date of Service: Autumn Massey, Autumn NDA D. 12/14/2020 2:45 PM Medical Record Number: [id number] Patient Account Number:  [id number] Date of Birth/Sex: Treating RN: 01-27-1966 (55 y.o. Female) Lorrin Jackson Primary Care Joniece Smotherman: [name] Other Clinician: Referring Cortlynn Hollinsworth: Treating Helon Wisinski/Extender: [name] in Treatment: 0 Active Inactive Orientation to the Wound Care Program Nursing Diagnoses: Knowledge deficit related to the wound healing center program Goals: Patient/caregiver will verbalize understanding of the [place] Date Initiated: 12/14/2020 Target Resolution Date: 01/11/2021 Goal Status: Active Interventions: Provide education on orientation to the wound center Notes: Venous Leg Ulcer Nursing Diagnoses: Actual venous Insuffiency (use after diagnosis is confirmed) Goals: Patient will maintain optimal edema control Date Initiated: 12/14/2020 Target Resolution Date: 01/11/2021 Goal Status: Active Interventions: Assess peripheral edema status every visit. Compression as ordered Provide education on venous insufficiency Treatment Activities: Therapeutic compression applied : 12/14/2020 Notes: Wound/Skin Impairment Nursing Diagnoses: Impaired tissue integrity Goals: Patient/caregiver will verbalize understanding of skin care regimen Date Initiated: 12/14/2020 Target Resolution Date: 01/11/2021 Goal Status: Active Ulcer/skin breakdown will have a volume reduction of 30% by week 4 Date Initiated: 12/14/2020 Target Resolution Date: 01/11/2021 Goal Status: Active Interventions: Assess patient/caregiver ability to obtain necessary supplies Assess patient/caregiver ability to perform ulcer/skin care regimen upon admission and as needed Assess ulceration(s) every visit Provide education on ulcer and skin care Treatment Activities: Skin care regimen initiated : 12/14/2020 Topical wound management initiated : 12/14/2020 Notes: Electronic Signature(s) Signed: 12/14/2020 5:38:00 PM By: Lorrin Jackson Entered By: Lorrin Jackson on 12/14/2020  16:30:28 -------------------------------------------------------------------------------- Pain Assessment Details Patient Name: Date of Service: Autumn Massey, Autumn NDA D. 12/14/2020  2:45 PM Medical Record Number: [id number] Patient Account Number: [id number] Date of Birth/Sex: Treating RN: 05-17-1966 (55 y.o. Female) Baruch Gouty Primary Care Rishan Oyama: [name] Other Clinician: Referring Rhydian Baldi: Treating Sakari Raisanen/Extender: [name] in Treatment: 0 Active Problems Location of Pain Severity and Description of Pain Patient Has Paino Yes Site Locations With Dressing Change: Yes Duration of the Pain. Constant / Intermittento Constant Rate the pain. Current Pain Level: 8 Worst Pain Level: 8 Least Pain Level: 2 Character of Pain Describe the Pain: Tender, Other: "pins" Pain Management and Medication Current Pain Management: Medication: Yes Leg drop or elevation: Yes Is the Current Pain Management Adequate: Adequate How does your wound impact your activities of daily livingo Sleep: Yes Bathing: No Appetite: No Relationship With Others: No Bladder Continence: No Emotions: No Bowel Continence: No Drive: No Toileting: No Hobbies: No Dressing: No Electronic Signature(s) Signed: 12/14/2020 5:33:29 PM By: Baruch Gouty RN, BSN Entered By: Baruch Gouty on 12/14/2020 16:00:48 -------------------------------------------------------------------------------- Patient/Caregiver Education Details Patient Name: Date of Service: Autumn Natal NDA D. 6/17/2022andnbsp2:45 PM Medical Record Number: [id number] Patient Account Number: [id number] Date of Birth/Gender: Treating RN: May 03, 1966 (56 y.o. Female) Lorrin Jackson Primary Care Physician: [name] Other Clinician: Referring Physician: Treating Physician/Extender: [name] in Treatment: 0 Education Assessment Education Provided To: Patient Education Topics  Provided Nutrition: Methods: Explain/Verbal, Printed Responses: State content correctly Venous: Methods: Demonstration, Explain/Verbal, Printed Responses: State content correctly [place]: o Methods: Explain/Verbal, Printed Responses: State content correctly Wound/Skin Impairment: Methods: Explain/Verbal, Printed Responses: State content correctly Electronic Signature(s) Signed: 12/14/2020 5:38:00 PM By: Lorrin Jackson Entered By: Lorrin Jackson on 12/14/2020 16:31:02 -------------------------------------------------------------------------------- Wound Assessment Details Patient Name: Date of Service: Autumn Massey, Autumn NDA D. 12/14/2020 2:45 PM Medical Record Number: [id number] Patient Account Number: [id number] Date of Birth/Sex: Treating RN: Jan 05, 1966 (55 y.o. Female) Baruch Gouty Primary Care Latifah Padin: [name] Other Clinician: Referring Kathryn Linarez: Treating Notnamed Croucher/Extender: [name] in Treatment: 0 Wound Status Wound Number: 1 Primary Diabetic Wound/Ulcer of the Lower Extremity Etiology: Wound Location: Right, Lateral Lower Leg Secondary Venous Leg Ulcer Wounding Event: Trauma Etiology: Date Acquired: 08/09/2020 Wound Open Weeks Of Treatment: 0 Status: Clustered Wound: No Comorbid Asthma, Congestive Heart Failure, Hypertension, Type II History: Diabetes, Osteoarthritis, Neuropathy Photos Wound Measurements Length: (cm) 1.8 Width: (cm) 0.8 Depth: (cm) 0.1 Area: (cm) 1.131 Volume: (cm) 0.113 % Reduction in Area: 0% % Reduction in Volume: 0% Epithelialization: Small (1-33%) Tunneling: No Undermining: No Wound Description Classification: Grade 1 Wound Margin: Flat and Intact Exudate Amount: Medium Exudate Type: Serosanguineous Exudate Color: red, brown Foul Odor After Cleansing: No Slough/Fibrino No Wound Bed Granulation Amount: Large (67-100%) Exposed Structure Granulation Quality: Red Fascia  Exposed: No Necrotic Amount: Small (1-33%) Fat Layer (Subcutaneous Tissue) Exposed: Yes Necrotic Quality: Adherent Slough Tendon Exposed: No Muscle Exposed: No Joint Exposed: No Bone Exposed: No Treatment Notes Wound #1 (Lower Leg) Wound Laterality: Right, Lateral Cleanser Soap and Water Discharge Instruction: May shower and wash wound with dial antibacterial soap and water prior to dressing change. Wound Cleanser Discharge Instruction: Cleanse the wound with wound cleanser prior to applying a clean dressing using gauze sponges, not tissue or cotton balls. Peri-Wound Care Triamcinolone 15 (g) Discharge Instruction: Apply liberally to red areas on leg Topical Primary Dressing KerraCel Ag Gelling Fiber Dressing, 4x5 in (silver alginate) Discharge Instruction: Apply silver alginate to wound bed as instructed Secondary Dressing Woven Gauze Sponge, Non-Sterile 4x4 in Discharge Instruction: Apply over primary dressing  as directed. ABD Pad, 5x9 Discharge Instruction: Apply over primary dressing as directed. Secured With Compression Wrap ThreePress (3 layer compression wrap) Discharge Instruction: Apply three layer compression as directed. Compression Stockings Add-Ons Electronic Signature(s) Signed: 12/17/2020 6:32:01 PM By: Baruch Gouty RN, BSN Signed: 12/20/2020 8:38:46 AM By: Leane Call Previous Signature: 12/14/2020 5:33:29 PM Version By: Baruch Gouty RN, BSN Entered By: Leane Call on 12/17/2020 16:05:55 -------------------------------------------------------------------------------- Wound Assessment Details Patient Name: Date of Service: Autumn Massey, Autumn NDA D. 12/14/2020 2:45 PM Medical Record Number: [id number] Patient Account Number: [id number] Date of Birth/Sex: Treating RN: Oct 01, 1965 (55 y.o. Female) Baruch Gouty Primary Care Keysi Oelkers: [name] Other Clinician: Referring Talene Glastetter: Treating Leahann Lempke/Extender: [name] in  Treatment: 0 Wound Status Wound Number: 2 Primary Diabetic Wound/Ulcer of the Lower Extremity Etiology: Wound Location: Right, Lateral, Posterior Lower Leg Secondary Venous Leg Ulcer Wounding Event: Trauma Etiology: Date Acquired: 08/09/2020 Wound Open Weeks Of Treatment: 0 Status: Clustered Wound: No Comorbid Asthma, Congestive Heart Failure, Hypertension, Type II History: Diabetes, Osteoarthritis, Neuropathy Photos Wound Measurements Length: (cm) 5.5 Width: (cm) 2.3 Depth: (cm) 0.1 Area: (cm) 9.935 Volume: (cm) 0.994 % Reduction in Area: 0% % Reduction in Volume: 0% Epithelialization: Medium (34-66%) Tunneling: No Undermining: No Wound Description Classification: Grade 1 Wound Margin: Flat and Intact Exudate Amount: Medium Exudate Type: Serosanguineous Exudate Color: red, brown Foul Odor After Cleansing: No Slough/Fibrino No Wound Bed Granulation Amount: Large (67-100%) Exposed Structure Granulation Quality: Red Fascia Exposed: No Necrotic Amount: Small (1-33%) Fat Layer (Subcutaneous Tissue) Exposed: Yes Necrotic Quality: Adherent Slough Tendon Exposed: No Muscle Exposed: No Joint Exposed: No Bone Exposed: No Treatment Notes Wound #2 (Lower Leg) Wound Laterality: Right, Lateral, Posterior Cleanser Soap and Water Discharge Instruction: May shower and wash wound with dial antibacterial soap and water prior to dressing change. Wound Cleanser Discharge Instruction: Cleanse the wound with wound cleanser prior to applying a clean dressing using gauze sponges, not tissue or cotton balls. Peri-Wound Care Triamcinolone 15 (g) Discharge Instruction: Apply liberally to red areas on leg Topical Primary Dressing KerraCel Ag Gelling Fiber Dressing, 4x5 in (silver alginate) Discharge Instruction: Apply silver alginate to wound bed as instructed Secondary Dressing Woven Gauze Sponge, Non-Sterile 4x4 in Discharge Instruction: Apply over primary dressing as  directed. ABD Pad, 5x9 Discharge Instruction: Apply over primary dressing as directed. Secured With Compression Wrap ThreePress (3 layer compression wrap) Discharge Instruction: Apply three layer compression as directed. Compression Stockings Add-Ons Electronic Signature(s) Signed: 12/17/2020 6:32:01 PM By: Baruch Gouty RN, BSN Signed: 12/20/2020 8:38:46 AM By: Leane Call Previous Signature: 12/14/2020 5:33:29 PM Version By: Baruch Gouty RN, BSN Entered By: Leane Call on 12/17/2020 16:05:29 -------------------------------------------------------------------------------- Vitals Details Patient Name: Date of Service: Autumn Massey, Autumn NDA D. 12/14/2020 2:45 PM Medical Record Number: [id number] Patient Account Number: [id number] Date of Birth/Sex: Treating RN: 1965-08-04 (55 y.o. Female) Baruch Gouty Primary Care Orlin Kann: [name] Other Clinician: Referring Torez Beauregard: Treating Amali Uhls/Extender: [name] in Treatment: 0 Vital Signs Time Taken: 15:35 Temperature (F): 98.7 Height (in): 62 Pulse (bpm): 83 Source: Stated Respiratory Rate (breaths/min): 22 Weight (lbs): 319 Blood Pressure (mmHg): 145/94 Source: Stated Capillary Blood Glucose (mg/dl): 157 Body Mass Index (BMI): 58.3 Reference Range: 80 - 120 mg / dl Notes glucose per pt report yesterday Electronic Signature(s) Signed: 12/14/2020 5:33:29 PM By: Baruch Gouty RN, BSN Entered By: Baruch Gouty on 12/14/2020 15:37:36

## 2020-12-17 ENCOUNTER — Other Ambulatory Visit: Payer: Self-pay | Admitting: Internal Medicine

## 2020-12-17 DIAGNOSIS — Z1231 Encounter for screening mammogram for malignant neoplasm of breast: Secondary | ICD-10-CM

## 2020-12-20 ENCOUNTER — Ambulatory Visit: Payer: Medicare Other

## 2020-12-21 ENCOUNTER — Other Ambulatory Visit: Payer: Self-pay

## 2020-12-21 ENCOUNTER — Encounter (HOSPITAL_BASED_OUTPATIENT_CLINIC_OR_DEPARTMENT_OTHER): Payer: Medicare Other | Admitting: Internal Medicine

## 2020-12-21 DIAGNOSIS — E1140 Type 2 diabetes mellitus with diabetic neuropathy, unspecified: Secondary | ICD-10-CM | POA: Diagnosis not present

## 2020-12-21 DIAGNOSIS — I110 Hypertensive heart disease with heart failure: Secondary | ICD-10-CM | POA: Diagnosis not present

## 2020-12-21 DIAGNOSIS — L97212 Non-pressure chronic ulcer of right calf with fat layer exposed: Secondary | ICD-10-CM | POA: Diagnosis not present

## 2020-12-21 DIAGNOSIS — L97818 Non-pressure chronic ulcer of other part of right lower leg with other specified severity: Secondary | ICD-10-CM | POA: Diagnosis not present

## 2020-12-21 DIAGNOSIS — L97812 Non-pressure chronic ulcer of other part of right lower leg with fat layer exposed: Secondary | ICD-10-CM | POA: Diagnosis not present

## 2020-12-21 DIAGNOSIS — E11622 Type 2 diabetes mellitus with other skin ulcer: Secondary | ICD-10-CM | POA: Diagnosis not present

## 2020-12-21 DIAGNOSIS — I509 Heart failure, unspecified: Secondary | ICD-10-CM | POA: Diagnosis not present

## 2020-12-21 NOTE — Progress Notes (Signed)
Autumn Massey ([id number]) Visit Report for 12/21/2020 HPI Details Patient Name: Date of Service: Autumn Massey, Autumn Massey NDA D. 12/21/2020 2:00 PM Medical Record Number: [id number] Patient Account Number: [id number] Date of Birth/Sex: Treating RN: Nov 11, 1965 (55 y.o. Autumn Massey Primary Care Provider: [name] Other Clinician: Referring Provider: Treating Provider/Extender: [name] in Treatment: 1 History of Present Illness HPI Description: ADMISSION 12/14/2020 This is a pleasant 55 year old woman who is here for our review of wound on the right lateral calf. She is a type II diabetic. She said this started when she traumatized her leg hitting it on the bed frame in February. This was painful. She was left with open wounds. She initially used topical antibiotics but found this was not happening more recently she has been using a combination of diluted hydrogen peroxide, and antibiotic pads. She has support hose but does not wear them. The patient complains of a lot of pain sometimes waking her up tonight at night with a sharp jabbing discomfort. She has neuropathy but this may not be secondary to her diabetes rather idiopathic peripheral neuropathy is also listed on her problem list. Past medical history includes type 2 diabetes not on insulin last hemoglobin A1c of 8, COVID in February 2022 with chronic shortness of breath. At 1 point she was listed as having COPD although she thinks this is more asthma, hypothyroidism, hyperlipidemia, nighttime oxygen, essential tremor hearing loss and hypertension. She had a lower extremity DVT study on 10/03/2020 and although it was a limited study because of pannus folds over her upper thighs there was no specific evidence of DVT on the right side. ABI in our clinic on the right was 0.94 6/24; patient be admitted to the clinic last week. She had 2 areas on her right lateral lower leg. Substantial amount of erythema around both of  these wounds which could have been cellulitis or contact dermatitis. I favor the latter but covered her with antibiotics in any case. She arrives in clinic today things look a lot better especially her skin. Wounds are smaller. We use silver alginate Electronic Signature(s) Signed: 12/21/2020 5:20:40 PM By: Linton Ham MD Entered By: Linton Ham on 12/21/2020 15:44:03 -------------------------------------------------------------------------------- Physical Exam Details Patient Name: Date of Service: Autumn Massey, [name] 12/21/2020 2:00 PM Medical Record Number: [id number] Patient Account Number: [id number] Date of Birth/Sex: Treating RN: 06-Feb-1966 (55 y.o. Autumn Massey Primary Care Provider: [name] Other Clinician: Referring Provider: Treating Provider/Extender: [name] in Treatment: 1 Constitutional Sitting or standing Blood Pressure is within target range for patient.. Pulse regular and within target range for patient.[name] Respirations regular, non-labored and within target range.. Temperature is normal and within the target range for the patient.[name] Appears in no distress. Cardiovascular Pedal pulses are palpable. Much better edema control. Notes Wound exam; right lateral calf with several surrounding small areas. These all look better. What is more is the markedly inflamed very warm and tender area around her wound which was really quite substantial is remarkably better. I suspect this was a contact dermatitis rather than true cellulitis nevertheless it is remarkably better and I did cover her with antibiotics last time Electronic Signature(s) Signed: 12/21/2020 5:20:40 PM By: Linton Ham MD Entered By: Linton Ham on 12/21/2020 15:46:45 -------------------------------------------------------------------------------- Physician Orders Details Patient Name: Date of Service: Autumn Massey, [name] 12/21/2020 2:00 PM Medical Record Number:  [id number] Patient Account Number: [id number] Date of Birth/Sex: Treating RN: 1966/06/25 (55 y.o. Autumn Massey, [facility]  Provider: [name] Other Clinician: Referring Provider: Treating Provider/Extender: [name] in Treatment: 1 Verbal / Phone Orders: No Diagnosis Coding ICD-10 Coding Code Description [phone] Chronic venous hypertension (idiopathic) with inflammation of right lower extremity L97.818 Non-pressure chronic ulcer of other part of right lower leg with other specified severity L03.115 Cellulitis of right lower limb L24.5 Irritant contact dermatitis due to other chemical products Follow-up Appointments ppointment in 1 week. - with Dr. Dellia Nims Return A Bathing/ Shower/ Hygiene May shower with protection but do not get wound dressing(s) wet. Edema Control - Lymphedema / SCD / Other Elevate legs to the level of the heart or above for 30 minutes daily and/or when sitting, a frequency of: Avoid standing for long periods of time. Additional Orders / Instructions Follow Nutritious Diet - Increase protein, 100-120g of protein. Other: - Begin taking Doxycycline, prescription sent to your pharmacy. Wound Treatment Wound #1 - Lower Leg Wound Laterality: Right, Lateral Cleanser: Soap and Water 1 x Per Week/30 Days Discharge Instructions: May shower and wash wound with dial antibacterial soap and water prior to dressing change. Cleanser: Wound Cleanser 1 x Per Week/30 Days Discharge Instructions: Cleanse the wound with wound cleanser prior to applying a clean dressing using gauze sponges, not tissue or cotton balls. Peri-Wound Care: Triamcinolone 15 (g) 1 x Per Week/30 Days Discharge Instructions: Apply liberally to red areas on leg Peri-Wound Care: Sween Lotion (Moisturizing lotion) 1 x Per Week/30 Days Discharge Instructions: Apply moisturizing lotion as directed Prim Dressing: KerraCel Ag Gelling Fiber Dressing, 4x5 in (silver alginate) 1 x  Per Week/30 Days ary Discharge Instructions: Apply silver alginate to wound bed as instructed Secondary Dressing: Woven Gauze Sponge, Non-Sterile 4x4 in 1 x Per Week/30 Days Discharge Instructions: Apply over primary dressing as directed. Compression Wrap: ThreePress (3 layer compression wrap) 1 x Per Week/30 Days Discharge Instructions: Apply three layer compression as directed. Wound #2 - Lower Leg Wound Laterality: Right, Lateral, Posterior Cleanser: Soap and Water 1 x Per Week/30 Days Discharge Instructions: May shower and wash wound with dial antibacterial soap and water prior to dressing change. Cleanser: Wound Cleanser 1 x Per Week/30 Days Discharge Instructions: Cleanse the wound with wound cleanser prior to applying a clean dressing using gauze sponges, not tissue or cotton balls. Peri-Wound Care: Triamcinolone 15 (g) 1 x Per Week/30 Days Discharge Instructions: Apply liberally to red areas on leg Prim Dressing: KerraCel Ag Gelling Fiber Dressing, 4x5 in (silver alginate) 1 x Per Week/30 Days ary Discharge Instructions: Apply silver alginate to wound bed as instructed Secondary Dressing: Woven Gauze Sponge, Non-Sterile 4x4 in 1 x Per Week/30 Days Discharge Instructions: Apply over primary dressing as directed. Compression Wrap: ThreePress (3 layer compression wrap) 1 x Per Week/30 Days Discharge Instructions: Apply three layer compression as directed. Electronic Signature(s) Signed: 12/21/2020 5:20:40 PM By: Linton Ham MD Signed: 12/21/2020 6:17:32 PM By: Baruch Gouty RN, BSN Entered By: Baruch Gouty on 12/21/2020 15:23:27 -------------------------------------------------------------------------------- Problem List Details Patient Name: Date of Service: Autumn Massey, [name] 12/21/2020 2:00 PM Medical Record Number: [id number] Patient Account Number: [id number] Date of Birth/Sex: Treating RN: 1966/04/05 (55 y.o. Autumn Massey Primary Care Provider: [name] Other Clinician: Referring Provider: Treating Provider/Extender: [name] in Treatment: 1 Active Problems ICD-10 Encounter Code Description Active Date MDM Diagnosis I87.321 Chronic venous hypertension (idiopathic) with inflammation of right lower 12/14/2020 No Yes extremity L97.818 Non-pressure chronic ulcer of other part of right lower leg with other specified 12/14/2020 No  Yes severity L03.115 Cellulitis of right lower limb 12/14/2020 No Yes L24.5 Irritant contact dermatitis due to other chemical products 12/14/2020 No Yes Inactive Problems Resolved Problems Electronic Signature(s) Signed: 12/21/2020 5:20:40 PM By: Linton Ham MD Entered By: Linton Ham on 12/21/2020 15:42:12 -------------------------------------------------------------------------------- Progress Note Details Patient Name: Date of Service: Autumn Massey, [name] 12/21/2020 2:00 PM Medical Record Number: [id number] Patient Account Number: [id number] Date of Birth/Sex: Treating RN: 06-Apr-1966 (55 y.o. Autumn Massey Primary Care Provider: [name] Other Clinician: Referring Provider: Treating Provider/Extender: [name] in Treatment: 1 Subjective History of Present Illness (HPI) ADMISSION 12/14/2020 This is a pleasant 55 year old woman who is here for our review of wound on the right lateral calf. She is a type II diabetic. She said this started when she traumatized her leg hitting it on the bed frame in February. This was painful. She was left with open wounds. She initially used topical antibiotics but found this was not happening more recently she has been using a combination of diluted hydrogen peroxide, and antibiotic pads. She has support hose but does not wear them. The patient complains of a lot of pain sometimes waking her up tonight at night with a sharp jabbing discomfort. She has neuropathy but this may not be secondary to her diabetes  rather idiopathic peripheral neuropathy is also listed on her problem list. Past medical history includes type 2 diabetes not on insulin last hemoglobin A1c of 8, COVID in February 2022 with chronic shortness of breath. At 1 point she was listed as having COPD although she thinks this is more asthma, hypothyroidism, hyperlipidemia, nighttime oxygen, essential tremor hearing loss and hypertension. She had a lower extremity DVT study on 10/03/2020 and although it was a limited study because of pannus folds over her upper thighs there was no specific evidence of DVT on the right side. ABI in our clinic on the right was 0.94 6/24; patient be admitted to the clinic last week. She had 2 areas on her right lateral lower leg. Substantial amount of erythema around both of these wounds which could have been cellulitis or contact dermatitis. I favor the latter but covered her with antibiotics in any case. She arrives in clinic today things look a lot better especially her skin. Wounds are smaller. We use silver alginate Objective Constitutional Sitting or standing Blood Pressure is within target range for patient.. Pulse regular and within target range for patient.[name] Respirations regular, non-labored and within target range.. Temperature is normal and within the target range for the patient.[name] Appears in no distress. Vitals Time Taken: 2:40 PM, Height: 62 in, Weight: 319 lbs, BMI: 58.3, Temperature: 98.7 F, Pulse: 83 bpm, Respiratory Rate: 20 breaths/min, Blood Pressure: 103/68 mmHg, Capillary Blood Glucose: 131 mg/dl. General Notes: glucose per pt report Cardiovascular Pedal pulses are palpable. Much better edema control. General Notes: Wound exam; right lateral calf with several surrounding small areas. These all look better. What is more is the markedly inflamed very warm and tender area around her wound which was really quite substantial is remarkably better. I suspect this was a contact dermatitis rather  than true cellulitis nevertheless it is remarkably better and I did cover her with antibiotics last time Integumentary (Hair, Skin) Wound #1 status is Open. Original cause of wound was Trauma. The date acquired was: 08/09/2020. The wound has been in treatment 1 weeks. The wound is located on the Right,Lateral Lower Leg. The wound measures 0.8cm length x 0.5cm width x 0.1cm depth;  0.314cm^2 area and 0.031cm^3 volume. There is Fat Layer (Subcutaneous Tissue) exposed. There is no tunneling or undermining noted. There is a medium amount of serosanguineous drainage noted. The wound margin is flat and intact. There is small (1-33%) pink, pale granulation within the wound bed. There is a large (67-100%) amount of necrotic tissue within the wound bed including Adherent Slough. Wound #2 status is Open. Original cause of wound was Trauma. The date acquired was: 08/09/2020. The wound has been in treatment 1 weeks. The wound is located on the Right,Lateral,Posterior Lower Leg. The wound measures 3.3cm length x 1cm width x 0.1cm depth; 2.592cm^2 area and 0.259cm^3 volume. There is Fat Layer (Subcutaneous Tissue) exposed. There is no tunneling or undermining noted. There is a medium amount of serosanguineous drainage noted. The wound margin is flat and intact. There is small (1-33%) red, pink granulation within the wound bed. There is a large (67-100%) amount of necrotic tissue within the wound bed including Adherent Slough. Assessment Active Problems ICD-10 Chronic venous hypertension (idiopathic) with inflammation of right lower extremity Non-pressure chronic ulcer of other part of right lower leg with other specified severity Cellulitis of right lower limb Irritant contact dermatitis due to other chemical products Procedures Wound #1 Pre-procedure diagnosis of Wound #1 is a Diabetic Wound/Ulcer of the Lower Extremity located on the Right,Lateral Lower Leg . There was a Three Layer Compression Therapy  Procedure by Deon Pilling, RN. Post procedure Diagnosis Wound #1: Same as Pre-Procedure Wound #2 Pre-procedure diagnosis of Wound #2 is a Diabetic Wound/Ulcer of the Lower Extremity located on the Right,Lateral,Posterior Lower Leg . There was a Three Layer Compression Therapy Procedure by Deon Pilling, RN. Post procedure Diagnosis Wound #2: Same as Pre-Procedure Plan Follow-up Appointments: Return Appointment in 1 week. - with Dr. Arcola Jansky Shower/ Hygiene: May shower with protection but do not get wound dressing(s) wet. Edema Control - Lymphedema / SCD / Other: Elevate legs to the level of the heart or above for 30 minutes daily and/or when sitting, a frequency of: Avoid standing for long periods of time. Additional Orders / Instructions: Follow Nutritious Diet - Increase protein, 100-120g of protein. Other: - Begin taking Doxycycline, prescription sent to your pharmacy. WOUND #1: - Lower Leg Wound Laterality: Right, Lateral Cleanser: Soap and Water 1 x Per Week/30 Days Discharge Instructions: May shower and wash wound with dial antibacterial soap and water prior to dressing change. Cleanser: Wound Cleanser 1 x Per Week/30 Days Discharge Instructions: Cleanse the wound with wound cleanser prior to applying a clean dressing using gauze sponges, not tissue or cotton balls. Peri-Wound Care: Triamcinolone 15 (g) 1 x Per Week/30 Days Discharge Instructions: Apply liberally to red areas on leg Peri-Wound Care: Sween Lotion (Moisturizing lotion) 1 x Per Week/30 Days Discharge Instructions: Apply moisturizing lotion as directed Prim Dressing: KerraCel Ag Gelling Fiber Dressing, 4x5 in (silver alginate) 1 x Per Week/30 Days ary Discharge Instructions: Apply silver alginate to wound bed as instructed Secondary Dressing: Woven Gauze Sponge, Non-Sterile 4x4 in 1 x Per Week/30 Days Discharge Instructions: Apply over primary dressing as directed. Com pression Wrap: ThreePress (3 layer  compression wrap) 1 x Per Week/30 Days Discharge Instructions: Apply three layer compression as directed. WOUND #2: - Lower Leg Wound Laterality: Right, Lateral, Posterior Cleanser: Soap and Water 1 x Per Week/30 Days Discharge Instructions: May shower and wash wound with dial antibacterial soap and water prior to dressing change. Cleanser: Wound Cleanser 1 x Per Week/30 Days Discharge Instructions: Cleanse the wound  with wound cleanser prior to applying a clean dressing using gauze sponges, not tissue or cotton balls. Peri-Wound Care: Triamcinolone 15 (g) 1 x Per Week/30 Days Discharge Instructions: Apply liberally to red areas on leg Prim Dressing: KerraCel Ag Gelling Fiber Dressing, 4x5 in (silver alginate) 1 x Per Week/30 Days ary Discharge Instructions: Apply silver alginate to wound bed as instructed Secondary Dressing: Woven Gauze Sponge, Non-Sterile 4x4 in 1 x Per Week/30 Days Discharge Instructions: Apply over primary dressing as directed. Com pression Wrap: ThreePress (3 layer compression wrap) 1 x Per Week/30 Days Discharge Instructions: Apply three layer compression as directed. 1. I am continuing with silver alginate with TCA to the periwound under compression. 2 I did talk to her about stockings 3. Skin moisturizer 4. I think this lady had a severe contact dermatitis for things which she was putting on the wound including hydrogen peroxide rubbing alcohol etc. Electronic Signature(s) Signed: 12/21/2020 5:20:40 PM By: Linton Ham MD Entered By: Linton Ham on 12/21/2020 15:48:23 -------------------------------------------------------------------------------- SuperBill Details Patient Name: Date of Service: [name] NDA D. 12/21/2020 Medical Record Number: [id number] Patient Account Number: [id number] Date of Birth/Sex: Treating RN: Jul 04, 1965 (55 y.o. Autumn Massey Primary Care Provider: [name] Other Clinician: Referring Provider: Treating  Provider/Extender: [name] in Treatment: 1 Diagnosis Coding ICD-10 Codes Code Description [phone] Chronic venous hypertension (idiopathic) with inflammation of right lower extremity L97.818 Non-pressure chronic ulcer of other part of right lower leg with other specified severity L03.115 Cellulitis of right lower limb L24.5 Irritant contact dermatitis due to other chemical products Facility Procedures CPT4 Code: [id number] Description: (Facility Use Only) [phone] - [place] [facility] LWR RT LEG Modifier: Quantity: 1 Physician Procedures : CPT4 Code Description Modifier [id number] - WC PHYS LEVEL 3 - EST PT ICD-10 Diagnosis Description I87.321 Chronic venous hypertension (idiopathic) with inflammation of right lower extremity L97.818 Non-pressure chronic ulcer of other part of right  lower leg with other specified severity L03.115 Cellulitis of right lower limb Quantity: 1 Electronic Signature(s) Signed: 12/21/2020 5:20:40 PM By: Linton Ham MD Entered By: Linton Ham on 12/21/2020 15:48:42

## 2020-12-25 NOTE — Progress Notes (Signed)
Autumn, Massey ([id number]) Visit Report for 12/21/2020 Arrival Information Details Patient Name: Date of Service: [place], [name] 12/21/2020 2:00 PM Medical Record Number: [id number] Patient Account Number: [id number] Date of Birth/Sex: Treating RN: Nov 09, 1965 (55 y.o. Autumn Massey, Briant Autumn Massey Primary Care Javonnie Illescas: [name] Other Clinician: Referring Taneeka Curtner: Treating Deanndra Kirley/Extender: [name] in Treatment: 1 Visit Information History Since Last Visit Added or deleted any medications: No Patient Arrived: Ambulatory Any new allergies or adverse reactions: No Arrival Time: 14:40 Had a fall or experienced change in No Accompanied By: alone activities of daily living that may affect Transfer Assistance: None risk of falls: Patient Identification Verified: Yes Signs or symptoms of abuse/neglect since last visito No Secondary Verification Process Completed: Yes Hospitalized since last visit: No Patient Requires Transmission-Based Precautions: No Implantable device outside of the clinic excluding No Patient Has Alerts: No cellular tissue based products placed in the center since last visit: Has Dressing in Place as Prescribed: Yes Pain Present Now: No Electronic Signature(s) Signed: 12/25/2020 5:39:04 PM By: Levan Hurst RN, BSN Entered By: Levan Hurst on 12/21/2020 14:40:57 -------------------------------------------------------------------------------- Compression Therapy Details Patient Name: Date of Service: Autumn Massey, [name] 12/21/2020 2:00 PM Medical Record Number: [id number] Patient Account Number: [id number] Date of Birth/Sex: Treating RN: 1966-06-03 (55 y.o. Autumn Massey Primary Care Autumn Massey: [name] Other Clinician: Referring Autumn Massey: Treating Autumn Massey/Extender: [name] in Treatment: 1 Compression Therapy Performed for Wound Assessment: Wound #1 Right,Lateral Lower Leg Performed By: Clinician  Autumn Pilling, RN Compression Type: Three Layer Post Procedure Diagnosis Same as Pre-procedure Electronic Signature(s) Signed: 12/21/2020 6:17:32 PM By: Baruch Gouty RN, BSN Entered By: Baruch Gouty on 12/21/2020 15:21:18 -------------------------------------------------------------------------------- Compression Therapy Details Patient Name: Date of Service: Autumn Massey, [name] 12/21/2020 2:00 PM Medical Record Number: [id number] Patient Account Number: [id number] Date of Birth/Sex: Treating RN: 03-Jul-1965 (55 y.o. Autumn Massey Primary Care Autumn Massey: [name] Other Clinician: Referring Autumn Massey: Treating Autumn Massey/Extender: [name] in Treatment: 1 Compression Therapy Performed for Wound Assessment: Wound #2 Right,Lateral,Posterior Lower Leg Performed By: Clinician Autumn Pilling, RN Compression Type: Three Layer Post Procedure Diagnosis Same as Pre-procedure Electronic Signature(s) Signed: 12/21/2020 6:17:32 PM By: Baruch Gouty RN, BSN Entered By: Baruch Gouty on 12/21/2020 15:21:18 -------------------------------------------------------------------------------- Encounter Discharge Information Details Patient Name: Date of Service: Autumn Massey, [name] 12/21/2020 2:00 PM Medical Record Number: [id number] Patient Account Number: [id number] Date of Birth/Sex: Treating RN: August 08, 1965 (55 y.o. Autumn Massey, Autumn Massey Primary Care Autumn Massey: [name] Other Clinician: Referring Autumn Massey: Treating Autumn Massey/Extender: [name] in Treatment: 1 Encounter Discharge Information Items Discharge Condition: Stable Ambulatory Status: Ambulatory Discharge Destination: Home Transportation: Private Auto Accompanied By: self Schedule Follow-up Appointment: Yes Clinical Summary of Care: Electronic Signature(s) Signed: 12/21/2020 6:02:43 PM By: Autumn Massey Entered By: Autumn Massey on 12/21/2020  17:57:21 -------------------------------------------------------------------------------- Lower Extremity Assessment Details Patient Name: Date of Service: Autumn Massey, [name] 12/21/2020 2:00 PM Medical Record Number: [id number] Patient Account Number: [id number] Date of Birth/Sex: Treating RN: Nov 13, 1965 (55 y.o. Autumn Massey Primary Care Autumn Massey: [name] Other Clinician: Referring Autumn Massey: Treating Autumn Massey/Extender: [name] in Treatment: 1 Edema Assessment Assessed: Shirlyn Goltz: No] Patrice Paradise: No] Edema: [Left: Ye] [Right: s] Calf Left: Right: Point of Measurement: From Medial Instep 40 cm Ankle Left: Right: Point of Measurement: From Medial Instep 22.5 cm Vascular Assessment Pulses: Dorsalis Pedis Palpable: [Right:Yes] Electronic Signature(s) Signed: 12/25/2020 5:39:04 PM By: Levan Hurst RN, BSN Entered By: Levan Hurst on  12/21/2020 14:45:11 -------------------------------------------------------------------------------- Multi Wound Chart Details Patient Name: Date of Service: Autumn Massey, [name] 12/21/2020 2:00 PM Medical Record Number: [id number] Patient Account Number: [id number] Date of Birth/Sex: Treating RN: 09-Dec-1965 (55 y.o. Autumn Massey Primary Care Chidi Shirer: [name] Other Clinician: Referring Autumn Massey: Treating Autumn Massey/Extender: [name] in Treatment: 1 Vital Signs Height(in): 62 Capillary Blood Glucose(mg/dl): 131 Weight(lbs): 319 Pulse(bpm): 83 Body Mass Index(BMI): 9 Blood Pressure(mmHg): 103/68 Temperature(F): 98.7 Respiratory Rate(breaths/min): 20 Photos: [1:No Photos Right, Lateral Lower Leg] [2:No Photos Right, Lateral, Posterior Lower Leg N/A] [N/A:N/A] Wound Location: [1:Trauma] [2:Trauma] [N/A:N/A] Wounding Event: [1:Diabetic Wound/Ulcer of the Lower] [2:Diabetic Wound/Ulcer of the Lower] [N/A:N/A] Primary Etiology: [1:Extremity Venous Leg Ulcer] [2:Extremity Venous  Leg Ulcer] [N/A:N/A] Secondary Etiology: [1:Asthma, Congestive Heart Failure,] [2:Asthma, Congestive Heart Failure,] [N/A:N/A] Comorbid History: [1:Hypertension, Type II Diabetes, Osteoarthritis, Neuropathy 08/09/2020] [2:Hypertension, Type II Diabetes, Osteoarthritis, Neuropathy 08/09/2020] [N/A:N/A] Date Acquired: [1:1] [2:1] [N/A:N/A] Weeks of Treatment: [1:Open] [2:Open] [N/A:N/A] Wound Status: [1:0.8x0.5x0.1] [2:3.3x1x0.1] [N/A:N/A] Measurements L x W x D (cm) [1:0.314] [2:2.592] [N/A:N/A] A (cm) : rea [1:0.031] [2:0.259] [N/A:N/A] Volume (cm) : [1:72.20%] [2:73.90%] [N/A:N/A] % Reduction in A rea: [1:72.60%] [2:73.90%] [N/A:N/A] % Reduction in Volume: [1:Grade 1] [2:Grade 1] [N/A:N/A] Classification: [1:Medium] [2:Medium] [N/A:N/A] Exudate A mount: [1:Serosanguineous] [2:Serosanguineous] [N/A:N/A] Exudate Type: [1:red, brown] [2:red, brown] [N/A:N/A] Exudate Color: [1:Flat and Intact] [2:Flat and Intact] [N/A:N/A] Wound Margin: [1:Small (1-33%)] [2:Small (1-33%)] [N/A:N/A] Granulation A mount: [1:Pink, Pale] [2:Red, Pink] [N/A:N/A] Granulation Quality: [1:Large (67-100%)] [2:Large (67-100%)] [N/A:N/A] Necrotic A mount: [1:Fat Layer (Subcutaneous Tissue): Yes Fat Layer (Subcutaneous Tissue): Yes N/A] Exposed Structures: [1:Fascia: No Tendon: No Muscle: No Joint: No Bone: No Small (1-33%)] [2:Fascia: No Tendon: No Muscle: No Joint: No Bone: No Small (1-33%)] [N/A:N/A] Epithelialization: [1:Compression Therapy] [2:Compression Therapy] [N/A:N/A] Procedures Performed: Treatment Notes Electronic Signature(s) Signed: 12/21/2020 5:20:40 PM By: Linton Ham MD Signed: 12/21/2020 6:17:32 PM By: Baruch Gouty RN, BSN Entered By: Linton Ham on 12/21/2020 15:42:43 -------------------------------------------------------------------------------- Pain Assessment Details Patient Name: Date of Service: Autumn Massey, [name] 12/21/2020 2:00 PM Medical Record Number: [id number] Patient  Account Number: [id number] Date of Birth/Sex: Treating RN: [birth year] (55 y.o. Autumn Massey Primary Care Kadence Mimbs: [name] Other Clinician: Referring Jakaden Ouzts: Treating Asusena Sigley/Extender: [name] in Treatment: 1 Active Problems Location of Pain Severity and Description of Pain Patient Has Paino No Site Locations Pain Management and Medication Current Pain Management: Electronic Signature(s) Signed: 12/25/2020 5:39:04 PM By: Levan Hurst RN, BSN Entered By: Levan Hurst on 12/21/2020 14:41:20 -------------------------------------------------------------------------------- Wound Assessment Details Patient Name: Date of Service: Autumn Massey, [name] 12/21/2020 2:00 PM Medical Record Number: [id number] Patient Account Number: [id number] Date of Birth/Sex: Treating RN: 1966/04/27 (55 y.o. Autumn Massey Primary Care Belky Mundo: [name] Other Clinician: Referring Tameeka Luo: Treating Chele Cornell/Extender: [name] in Treatment: 1 Wound Status Wound Number: 1 Primary Diabetic Wound/Ulcer of the Lower Extremity Etiology: Wound Location: Right, Lateral Lower Leg Secondary Venous Leg Ulcer Wounding Event: Trauma Etiology: Date Acquired: 08/09/2020 Wound Open Weeks Of Treatment: 1 Status: Clustered Wound: No Comorbid Asthma, Congestive Heart Failure, Hypertension, Type II History: Diabetes, Osteoarthritis, Neuropathy Photos Wound Measurements Length: (cm) 0.8 Width: (cm) 0.5 Depth: (cm) 0.1 Area: (cm) 0.314 Volume: (cm) 0.031 % Reduction in Area: 72.2% % Reduction in Volume: 72.6% Epithelialization: Small (1-33%) Tunneling: No Undermining: No Wound Description Classification: Grade 1 Wound Margin: Flat and Intact Exudate Amount: Medium Exudate Type: Serosanguineous Exudate Color: red, brown Foul Odor After Cleansing: No Slough/Fibrino Yes  Wound Bed Granulation Amount: Small (1-33%) Exposed  Structure Granulation Quality: Pink, Pale Fascia Exposed: No Necrotic Amount: Large (67-100%) Fat Layer (Subcutaneous Tissue) Exposed: Yes Necrotic Quality: Adherent Slough Tendon Exposed: No Muscle Exposed: No Joint Exposed: No Bone Exposed: No Treatment Notes Wound #1 (Lower Leg) Wound Laterality: Right, Lateral Cleanser Soap and Water Discharge Instruction: May shower and wash wound with dial antibacterial soap and water prior to dressing change. Wound Cleanser Discharge Instruction: Cleanse the wound with wound cleanser prior to applying a clean dressing using gauze sponges, not tissue or cotton balls. Peri-Wound Care Triamcinolone 15 (g) Discharge Instruction: Apply liberally to red areas on leg Sween Lotion (Moisturizing lotion) Discharge Instruction: Apply moisturizing lotion as directed Topical Primary Dressing KerraCel Ag Gelling Fiber Dressing, 4x5 in (silver alginate) Discharge Instruction: Apply silver alginate to wound bed as instructed Secondary Dressing Woven Gauze Sponge, Non-Sterile 4x4 in Discharge Instruction: Apply over primary dressing as directed. Secured With Compression Wrap ThreePress (3 layer compression wrap) Discharge Instruction: Apply three layer compression as directed. Compression Stockings Add-Ons Electronic Signature(s) Signed: 12/21/2020 5:07:58 PM By: Sandre Kitty Signed: 12/25/2020 5:39:04 PM By: Levan Hurst RN, BSN Entered By: Sandre Kitty on 12/21/2020 16:58:00 -------------------------------------------------------------------------------- Wound Assessment Details Patient Name: Date of Service: Autumn Massey, [name] 12/21/2020 2:00 PM Medical Record Number: [id number] Patient Account Number: [id number] Date of Birth/Sex: Treating RN: 26-Feb-1966 (55 y.o. Autumn Massey Primary Care Ceola Para: [name] Other Clinician: Referring Viveca Beckstrom: Treating Delaina Fetsch/Extender: [name] in Treatment:  1 Wound Status Wound Number: 2 Primary Diabetic Wound/Ulcer of the Lower Extremity Etiology: Wound Location: Right, Lateral, Posterior Lower Leg Secondary Venous Leg Ulcer Wounding Event: Trauma Etiology: Date Acquired: 08/09/2020 Wound Open Weeks Of Treatment: 1 Status: Clustered Wound: No Comorbid Asthma, Congestive Heart Failure, Hypertension, Type II History: Diabetes, Osteoarthritis, Neuropathy Photos Wound Measurements Length: (cm) 3.3 Width: (cm) 1 Depth: (cm) 0.1 Area: (cm) 2.592 Volume: (cm) 0.259 % Reduction in Area: 73.9% % Reduction in Volume: 73.9% Epithelialization: Small (1-33%) Tunneling: No Undermining: No Wound Description Classification: Grade 1 Wound Margin: Flat and Intact Exudate Amount: Medium Exudate Type: Serosanguineous Exudate Color: red, brown Foul Odor After Cleansing: No Slough/Fibrino Yes Wound Bed Granulation Amount: Small (1-33%) Exposed Structure Granulation Quality: Red, Pink Fascia Exposed: No Necrotic Amount: Large (67-100%) Fat Layer (Subcutaneous Tissue) Exposed: Yes Necrotic Quality: Adherent Slough Tendon Exposed: No Muscle Exposed: No Joint Exposed: No Bone Exposed: No Treatment Notes Wound #2 (Lower Leg) Wound Laterality: Right, Lateral, Posterior Cleanser Soap and Water Discharge Instruction: May shower and wash wound with dial antibacterial soap and water prior to dressing change. Wound Cleanser Discharge Instruction: Cleanse the wound with wound cleanser prior to applying a clean dressing using gauze sponges, not tissue or cotton balls. Peri-Wound Care Triamcinolone 15 (g) Discharge Instruction: Apply liberally to red areas on leg Topical Primary Dressing KerraCel Ag Gelling Fiber Dressing, 4x5 in (silver alginate) Discharge Instruction: Apply silver alginate to wound bed as instructed Secondary Dressing Woven Gauze Sponge, Non-Sterile 4x4 in Discharge Instruction: Apply over primary dressing as  directed. Secured With Compression Wrap ThreePress (3 layer compression wrap) Discharge Instruction: Apply three layer compression as directed. Compression Stockings Add-Ons Electronic Signature(s) Signed: 12/21/2020 5:07:58 PM By: Sandre Kitty Signed: 12/25/2020 5:39:04 PM By: Levan Hurst RN, BSN Entered By: Sandre Kitty on 12/21/2020 16:58:14 -------------------------------------------------------------------------------- Vitals Details Patient Name: Date of Service: Autumn Massey, [name] 12/21/2020 2:00 PM Medical Record Number: [id number] Patient Account Number: [id number] Date of Birth/Sex: Treating RN: 11/18/1965 (55  y.o. Autumn Massey Primary Care Izzac Rockett: [name] Other Clinician: Referring Armonie Mettler: Treating Saarah Dewing/Extender: [name] in Treatment: 1 Vital Signs Time Taken: 14:40 Temperature (F): 98.7 Height (in): 62 Pulse (bpm): 83 Weight (lbs): 319 Respiratory Rate (breaths/min): 20 Body Mass Index (BMI): 58.3 Blood Pressure (mmHg): 103/68 Capillary Blood Glucose (mg/dl): 131 Reference Range: 80 - 120 mg / dl Notes glucose per pt report Electronic Signature(s) Signed: 12/25/2020 5:39:04 PM By: Levan Hurst RN, BSN Entered By: Levan Hurst on 12/21/2020 14:41:15

## 2020-12-26 ENCOUNTER — Encounter (HOSPITAL_BASED_OUTPATIENT_CLINIC_OR_DEPARTMENT_OTHER): Payer: Medicare Other | Attending: Internal Medicine | Admitting: Internal Medicine

## 2020-12-26 ENCOUNTER — Other Ambulatory Visit: Payer: Self-pay

## 2020-12-26 DIAGNOSIS — I10 Essential (primary) hypertension: Secondary | ICD-10-CM | POA: Insufficient documentation

## 2020-12-26 DIAGNOSIS — J449 Chronic obstructive pulmonary disease, unspecified: Secondary | ICD-10-CM | POA: Diagnosis not present

## 2020-12-26 DIAGNOSIS — L97212 Non-pressure chronic ulcer of right calf with fat layer exposed: Secondary | ICD-10-CM | POA: Diagnosis not present

## 2020-12-26 DIAGNOSIS — I87321 Chronic venous hypertension (idiopathic) with inflammation of right lower extremity: Secondary | ICD-10-CM | POA: Insufficient documentation

## 2020-12-26 DIAGNOSIS — E1140 Type 2 diabetes mellitus with diabetic neuropathy, unspecified: Secondary | ICD-10-CM | POA: Diagnosis not present

## 2020-12-26 DIAGNOSIS — L97818 Non-pressure chronic ulcer of other part of right lower leg with other specified severity: Secondary | ICD-10-CM | POA: Insufficient documentation

## 2020-12-26 DIAGNOSIS — L03115 Cellulitis of right lower limb: Secondary | ICD-10-CM | POA: Diagnosis not present

## 2020-12-26 DIAGNOSIS — E11621 Type 2 diabetes mellitus with foot ulcer: Secondary | ICD-10-CM | POA: Diagnosis not present

## 2020-12-26 DIAGNOSIS — E11622 Type 2 diabetes mellitus with other skin ulcer: Secondary | ICD-10-CM | POA: Diagnosis not present

## 2020-12-26 DIAGNOSIS — L245 Irritant contact dermatitis due to other chemical products: Secondary | ICD-10-CM | POA: Diagnosis not present

## 2020-12-26 DIAGNOSIS — Z86718 Personal history of other venous thrombosis and embolism: Secondary | ICD-10-CM | POA: Insufficient documentation

## 2020-12-26 NOTE — Progress Notes (Signed)
Autumn Massey, Autumn Massey ([id number]) Visit Report for 12/26/2020 HPI Details Patient Name: Date of Service: Autumn Massey, Autumn Massey Autumn D. 12/26/2020 3:15 PM Medical Record Number: [id number] Patient Account Number: [id number] Date of Birth/Sex: Treating RN: 02/07/1966 (56 y.o. [name] Primary Care Provider: [name] Other Clinician: Referring Provider: Treating Provider/Extender: [name] in Treatment: 1 History of Present Illness HPI Description: ADMISSION 12/14/2020 This is a pleasant 55 year old woman who is here for our review of wound on the right lateral calf. She is a type II diabetic. She said this started when she traumatized her leg hitting it on the bed frame in February. This was painful. She was left with open wounds. She initially used topical antibiotics but found this was not happening more recently she has been using a combination of diluted hydrogen peroxide, and antibiotic pads. She has support hose but does not wear them. The patient complains of a lot of pain sometimes waking her up tonight at night with a sharp jabbing discomfort. She has neuropathy but this may not be secondary to her diabetes rather idiopathic peripheral neuropathy is also listed on her problem list. Past medical history includes type 2 diabetes not on insulin last hemoglobin A1c of 8, COVID in February 2022 with chronic shortness of breath. At 1 point she was listed as having COPD although she thinks this is more asthma, hypothyroidism, hyperlipidemia, nighttime oxygen, essential tremor hearing loss and hypertension. She had a lower extremity DVT study on 10/03/2020 and although it was a limited study because of pannus folds over her upper thighs there was no specific evidence of DVT on the right side. ABI in our clinic on the right was 0.94 6/24; patient be admitted to the clinic last week. She had 2 areas on her right lateral lower leg. Substantial amount of erythema around both of  these wounds which could have been cellulitis or contact dermatitis. I favor the latter but covered her with antibiotics in any case. She arrives in clinic today things look a lot better especially her skin. Wounds are smaller. We use silver alginate 6/29; the patient is down to 1 area on the right lateral lower leg. The erythema around this is totally resolved this was likely a contact dermatitis. I been using silver alginate Electronic Signature(s) Signed: 12/26/2020 4:58:19 PM By: Linton Ham MD Entered By: Linton Ham on 12/26/2020 16:44:48 -------------------------------------------------------------------------------- Physical Exam Details Patient Name: Date of Service: Autumn Massey, Autumn Autumn D. 12/26/2020 3:15 PM Medical Record Number: [id number] Patient Account Number: [id number] Date of Birth/Sex: Treating RN: February 22, 1966 (55 y.o. [name] Primary Care Provider: [name] Other Clinician: Referring Provider: Treating Provider/Extender: [name] in Treatment: 1 Constitutional Sitting or standing Blood Pressure is within target range for patient.. Pulse regular and within target range for patient.[name] Respirations regular, non-labored and within target range.. Temperature is normal and within the target range for the patient.[name] Appears in no distress. Notes Wound exam; right lateral calf. Several other small areas she had are healed. There is only one superficial larger area. Surface is not completely viable here. The erythema that she had initially is totally resolved. We have good edema control. Good peripheral pulses Electronic Signature(s) Signed: 12/26/2020 4:58:19 PM By: Linton Ham MD Entered By: Linton Ham on 12/26/2020 16:45:47 -------------------------------------------------------------------------------- Physician Orders Details Patient Name: Date of Service: Autumn Massey, Autumn Autumn D. 12/26/2020 3:15 PM Medical Record Number:  [id number] Patient Account Number: [id number] Date of Birth/Sex: Treating RN: [birth year] (55 y.o. F)  Levan Hurst Primary Care Provider: [name] Other Clinician: Referring Provider: Treating Provider/Extender: [name] in Treatment: 1 Verbal / Phone Orders: No Diagnosis Coding ICD-10 Coding Code Description [phone] Chronic venous hypertension (idiopathic) with inflammation of right lower extremity L97.818 Non-pressure chronic ulcer of other part of right lower leg with other specified severity L03.115 Cellulitis of right lower limb L24.5 Irritant contact dermatitis due to other chemical products Follow-up Appointments ppointment in 1 week. - with Dr. Dellia Nims Return A Bathing/ Shower/ Hygiene May shower with protection but do not get wound dressing(s) wet. Edema Control - Lymphedema / SCD / Other Elevate legs to the level of the heart or above for 30 minutes daily and/or when sitting, a frequency of: - throughout the day Avoid standing for long periods of time. Additional Orders / Instructions Follow Nutritious Diet - Increase protein, 100-120g of protein. Wound Treatment Wound #2 - Lower Leg Wound Laterality: Right, Lateral, Posterior Cleanser: Soap and Water 1 x Per Week/30 Days Discharge Instructions: May shower and wash wound with dial antibacterial soap and water prior to dressing change. Cleanser: Wound Cleanser 1 x Per Week/30 Days Discharge Instructions: Cleanse the wound with wound cleanser prior to applying a clean dressing using gauze sponges, not tissue or cotton balls. Peri-Wound Care: Triamcinolone 15 (g) 1 x Per Week/30 Days Discharge Instructions: Apply liberally to red areas on leg Peri-Wound Care: Sween Lotion (Moisturizing lotion) 1 x Per Week/30 Days Discharge Instructions: Apply moisturizing lotion as directed Prim Dressing: Cutimed Sorbact Swab 1 x Per Week/30 Days ary Discharge Instructions: Apply to wound bed as  instructed Secondary Dressing: Woven Gauze Sponge, Non-Sterile 4x4 in 1 x Per Week/30 Days Discharge Instructions: Apply over primary dressing as directed. Compression Wrap: ThreePress (3 layer compression wrap) 1 x Per Week/30 Days Discharge Instructions: Apply three layer compression as directed. Electronic Signature(s) Signed: 12/26/2020 4:56:05 PM By: Levan Hurst RN, BSN Signed: 12/26/2020 4:58:19 PM By: Linton Ham MD Entered By: Levan Hurst on 12/26/2020 16:04:22 -------------------------------------------------------------------------------- Problem List Details Patient Name: Date of Service: Autumn Massey, Autumn Autumn D. 12/26/2020 3:15 PM Medical Record Number: [id number] Patient Account Number: [id number] Date of Birth/Sex: Treating RN: 02-Oct-1965 (55 y.o. [name] Primary Care Provider: [name] Other Clinician: Referring Provider: Treating Provider/Extender: [name] in Treatment: 1 Active Problems ICD-10 Encounter Code Description Active Date MDM Diagnosis I87.321 Chronic venous hypertension (idiopathic) with inflammation of right lower 12/14/2020 No Yes extremity L97.818 Non-pressure chronic ulcer of other part of right lower leg with other specified 12/14/2020 No Yes severity L03.115 Cellulitis of right lower limb 12/14/2020 No Yes L24.5 Irritant contact dermatitis due to other chemical products 12/14/2020 No Yes Inactive Problems Resolved Problems Electronic Signature(s) Signed: 12/26/2020 4:58:19 PM By: Linton Ham MD Entered By: Linton Ham on 12/26/2020 16:44:06 -------------------------------------------------------------------------------- Progress Note Details Patient Name: Date of Service: Autumn Massey, Autumn Autumn D. 12/26/2020 3:15 PM Medical Record Number: [id number] Patient Account Number: [id number] Date of Birth/Sex: Treating RN: 04-10-1966 (55 y.o. [name] Primary Care Provider: [name] Other  Clinician: Referring Provider: Treating Provider/Extender: [name] in Treatment: 1 Subjective History of Present Illness (HPI) ADMISSION 12/14/2020 This is a pleasant 55 year old woman who is here for our review of wound on the right lateral calf. She is a type II diabetic. She said this started when she traumatized her leg hitting it on the bed frame in February. This was painful. She was left with open wounds. She initially used topical antibiotics  but found this was not happening more recently she has been using a combination of diluted hydrogen peroxide, and antibiotic pads. She has support hose but does not wear them. The patient complains of a lot of pain sometimes waking her up tonight at night with a sharp jabbing discomfort. She has neuropathy but this may not be secondary to her diabetes rather idiopathic peripheral neuropathy is also listed on her problem list. Past medical history includes type 2 diabetes not on insulin last hemoglobin A1c of 8, COVID in February 2022 with chronic shortness of breath. At 1 point she was listed as having COPD although she thinks this is more asthma, hypothyroidism, hyperlipidemia, nighttime oxygen, essential tremor hearing loss and hypertension. She had a lower extremity DVT study on 10/03/2020 and although it was a limited study because of pannus folds over her upper thighs there was no specific evidence of DVT on the right side. ABI in our clinic on the right was 0.94 6/24; patient be admitted to the clinic last week. She had 2 areas on her right lateral lower leg. Substantial amount of erythema around both of these wounds which could have been cellulitis or contact dermatitis. I favor the latter but covered her with antibiotics in any case. She arrives in clinic today things look a lot better especially her skin. Wounds are smaller. We use silver alginate 6/29; the patient is down to 1 area on the right lateral lower leg.  The erythema around this is totally resolved this was likely a contact dermatitis. I been using silver alginate Objective Constitutional Sitting or standing Blood Pressure is within target range for patient.. Pulse regular and within target range for patient.[name] Respirations regular, non-labored and within target range.. Temperature is normal and within the target range for the patient.[name] Appears in no distress. Vitals Time Taken: 3:28 PM, Height: 62 in, Weight: 319 lbs, BMI: 58.3, Temperature: 98.6 F, Pulse: 69 bpm, Respiratory Rate: 22 breaths/min, Blood Pressure: 133/83 mmHg, Capillary Blood Glucose: 160 mg/dl. General Notes: Wound exam; right lateral calf. Several other small areas she had are healed. There is only one superficial larger area. Surface is not completely viable here. The erythema that she had initially is totally resolved. We have good edema control. Good peripheral pulses Integumentary (Hair, Skin) Wound #1 status is Open. Original cause of wound was Trauma. The date acquired was: 08/09/2020. The wound has been in treatment 1 weeks. The wound is located on the Right,Lateral Lower Leg. The wound measures 0cm length x 0cm width x 0cm depth; 0cm^2 area and 0cm^3 volume. Wound #2 status is Open. Original cause of wound was Trauma. The date acquired was: 08/09/2020. The wound has been in treatment 1 weeks. The wound is located on the Right,Lateral,Posterior Lower Leg. The wound measures 3.3cm length x 1cm width x 0.1cm depth; 2.592cm^2 area and 0.259cm^3 volume. There is Fat Layer (Subcutaneous Tissue) exposed. There is no tunneling or undermining noted. There is a medium amount of serosanguineous drainage noted. The wound margin is flat and intact. There is large (67-100%) red, pink granulation within the wound bed. There is a small (1-33%) amount of necrotic tissue within the wound bed including Adherent Slough. Assessment Active Problems ICD-10 Chronic venous hypertension  (idiopathic) with inflammation of right lower extremity Non-pressure chronic ulcer of other part of right lower leg with other specified severity Cellulitis of right lower limb Irritant contact dermatitis due to other chemical products Procedures Wound #2 Pre-procedure diagnosis of Wound #2 is a Diabetic Wound/Ulcer  of the Lower Extremity located on the Right,Lateral,Posterior Lower Leg . There was a Three Layer Compression Therapy Procedure by Levan Hurst, RN. Post procedure Diagnosis Wound #2: Same as Pre-Procedure Plan Follow-up Appointments: Return Appointment in 1 week. - with Dr. Arcola Jansky Shower/ Hygiene: May shower with protection but do not get wound dressing(s) wet. Edema Control - Lymphedema / SCD / Other: Elevate legs to the level of the heart or above for 30 minutes daily and/or when sitting, a frequency of: - throughout the day Avoid standing for long periods of time. Additional Orders / Instructions: Follow Nutritious Diet - Increase protein, 100-120g of protein. WOUND #2: - Lower Leg Wound Laterality: Right, Lateral, Posterior Cleanser: Soap and Water 1 x Per Week/30 Days Discharge Instructions: May shower and wash wound with dial antibacterial soap and water prior to dressing change. Cleanser: Wound Cleanser 1 x Per Week/30 Days Discharge Instructions: Cleanse the wound with wound cleanser prior to applying a clean dressing using gauze sponges, not tissue or cotton balls. Peri-Wound Care: Triamcinolone 15 (g) 1 x Per Week/30 Days Discharge Instructions: Apply liberally to red areas on leg Peri-Wound Care: Sween Lotion (Moisturizing lotion) 1 x Per Week/30 Days Discharge Instructions: Apply moisturizing lotion as directed Prim Dressing: Cutimed Sorbact Swab 1 x Per Week/30 Days ary Discharge Instructions: Apply to wound bed as instructed Secondary Dressing: Woven Gauze Sponge, Non-Sterile 4x4 in 1 x Per Week/30 Days Discharge Instructions: Apply over primary  dressing as directed. Com pression Wrap: ThreePress (3 layer compression wrap) 1 x Per Week/30 Days Discharge Instructions: Apply three layer compression as directed. 1. I change the primary dressing to Sorbact to see if we can get some wound debridement. 2. I did not debride her this week but next week may be necessary if we cannot get a viable surface and reduction in wound surface area Electronic Signature(s) Signed: 12/26/2020 4:58:19 PM By: Linton Ham MD Entered By: Linton Ham on 12/26/2020 16:46:37 -------------------------------------------------------------------------------- SuperBill Details Patient Name: Date of Service: Autumn Massey Autumn D. 12/26/2020 Medical Record Number: [id number] Patient Account Number: [id number] Date of Birth/Sex: Treating RN: 08/26/1965 (55 y.o. [name] Primary Care Provider: [name] Other Clinician: Referring Provider: Treating Provider/Extender: [name] in Treatment: 1 Diagnosis Coding ICD-10 Codes Code Description [phone] Chronic venous hypertension (idiopathic) with inflammation of right lower extremity L97.818 Non-pressure chronic ulcer of other part of right lower leg with other specified severity L03.115 Cellulitis of right lower limb L24.5 Irritant contact dermatitis due to other chemical products Facility Procedures CPT4 Code: [id number] Description: (Facility Use Only) [phone] - [facility] LWR RT LEG Modifier: Quantity: 1 Physician Procedures : CPT4 Code Description Modifier [id number] - WC PHYS LEVEL 3 - EST PT ICD-10 Diagnosis Description L97.818 Non-pressure chronic ulcer of other part of right lower leg with other specified severity I87.321 Chronic venous hypertension (idiopathic) with  inflammation of right lower extremity Quantity: 1 Electronic Signature(s) Signed: 12/26/2020 4:58:19 PM By: Linton Ham MD Entered By: Linton Ham on 12/26/2020 16:47:00

## 2020-12-26 NOTE — Telephone Encounter (Signed)
Error

## 2020-12-26 NOTE — Progress Notes (Signed)
Autumn Massey, Autumn Massey ([id number]) Visit Report for 12/26/2020 Arrival Information Details Patient Name: Date of Service: Autumn Massey, [name] 12/26/2020 3:15 PM Medical Record Number: [id number] Patient Account Number: [id number] Date of Birth/Sex: Treating RN: [birth year] (55 y.o. Autumn Massey, Autumn Massey Primary Care Autumn Massey: Autumn Massey Other Clinician: Referring Autumn Massey: Treating Autumn Massey/Extender: Autumn Massey in Treatment: 1 Visit Information History Since Last Visit Added or deleted any medications: No Patient Arrived: Ambulatory Any new allergies or adverse reactions: No Arrival Time: 15:14 Had a fall or experienced change in No Accompanied By: self activities of daily living that may affect Transfer Assistance: None risk of falls: Patient Identification Verified: Yes Signs or symptoms of abuse/neglect since last visito No Secondary Verification Process Completed: Yes Hospitalized since last visit: No Patient Requires Transmission-Based Precautions: No Implantable device outside of the clinic excluding No Patient Has Alerts: No cellular tissue based products placed in the center since last visit: Has Dressing in Place as Prescribed: Yes Has Compression in Place as Prescribed: Yes Pain Present Now: No Electronic Signature(s) Signed: 12/26/2020 6:10:10 PM By: Deon Pilling Entered By: Deon Pilling on 12/26/2020 15:22:52 -------------------------------------------------------------------------------- Compression Therapy Details Patient Name: Date of Service: Autumn Massey, [name] 12/26/2020 3:15 PM Medical Record Number: [id number] Patient Account Number: [id number] Date of Birth/Sex: Treating RN: May 13, 1966 (55 y.o. Autumn Massey Primary Care Autumn Massey: Autumn Massey Other Clinician: Referring Autumn Massey: Treating Autumn Massey/Extender: Autumn Massey in Treatment: 1 Compression Therapy Performed for Wound Assessment: Wound #2  Right,Lateral,Posterior Lower Leg Performed By: Clinician Autumn Hurst, RN Compression Type: Three Layer Post Procedure Diagnosis Same as Pre-procedure Electronic Signature(s) Signed: 12/26/2020 4:56:05 PM By: Autumn Hurst RN, BSN Entered By: Autumn Massey on 12/26/2020 16:03:39 -------------------------------------------------------------------------------- Lower Extremity Assessment Details Patient Name: Date of Service: Autumn Massey, [name] 12/26/2020 3:15 PM Medical Record Number: [id number] Patient Account Number: [id number] Date of Birth/Sex: Treating RN: [birth year] (55 y.o. Autumn Massey, Autumn Massey Primary Care Raijon Lindfors: Autumn Massey Other Clinician: Referring Iyania Denne: Treating Autumn Massey/Extender: Autumn Massey in Treatment: 1 Edema Assessment Assessed: Autumn Massey: No] Autumn Massey: Yes] Edema: [Left: Ye] [Right: s] Calf Left: Right: Point of Measurement: From Medial Instep 41.5 cm Ankle Left: Right: Point of Measurement: From Medial Instep 22.5 cm Vascular Assessment Pulses: Dorsalis Pedis Palpable: [Right:Yes] Electronic Signature(s) Signed: 12/26/2020 6:10:10 PM By: Deon Pilling Entered By: Deon Pilling on 12/26/2020 15:23:42 -------------------------------------------------------------------------------- Multi Wound Chart Details Patient Name: Date of Service: Autumn Massey, [name] 12/26/2020 3:15 PM Medical Record Number: [id number] Patient Account Number: [id number] Date of Birth/Sex: Treating RN: 01-12-1966 (55 y.o. Autumn Massey Primary Care Autumn Massey: Autumn Massey Other Clinician: Referring Autumn Massey: Treating Autumn Massey/Extender: Autumn Massey in Treatment: 1 Vital Signs Height(in): 62 Capillary Blood Glucose(mg/dl): 160 Weight(lbs): 319 Pulse(bpm): 70 Body Mass Index(BMI): 35 Blood Pressure(mmHg): 133/83 Temperature(F): 98.6 Respiratory Rate(breaths/min): 22 Photos: [1:No Photos Right, Lateral Lower Leg] [2:No Photos  Right, Lateral, Posterior Lower Leg] [N/A:N/A N/A] Wound Location: [1:Trauma] [2:Trauma] [N/A:N/A] Wounding Event: [1:Diabetic Wound/Ulcer of the Lower] [2:Diabetic Wound/Ulcer of the Lower] [N/A:N/A] Primary Etiology: [1:Extremity Venous Leg Ulcer] [2:Extremity Venous Leg Ulcer] [N/A:N/A] Secondary Etiology: [1:N/A] [2:Asthma, Congestive Heart Failure,] [N/A:N/A] Comorbid History: [1:08/09/2020] [2:Hypertension, Type II Diabetes, Osteoarthritis, Neuropathy 08/09/2020] [N/A:N/A] Date Acquired: [1:1] [2:1] [N/A:N/A] Weeks of Treatment: [1:Open] [2:Open] [N/A:N/A] Wound Status: [1:0x0x0] [2:3.3x1x0.1] [N/A:N/A] Measurements L x W x D (cm) [1:0] [2:2.592] [N/A:N/A] A (cm) : rea [1:0] [2:0.259] [N/A:N/A] Volume (cm) : [1:100.00%] [2:73.90%] [N/A:N/A] % Reduction in A [1:rea: 100.00%] [2:73.90%] [N/A:N/A] % Reduction  in Volume: [1:Grade 1] [2:Grade 1] [N/A:N/A] Classification: [1:N/A] [2:Medium] [N/A:N/A] Exudate A mount: [1:N/A] [2:Serosanguineous] [N/A:N/A] Exudate Type: [1:N/A] [2:red, brown] [N/A:N/A] Exudate Color: [1:N/A] [2:Flat and Intact] [N/A:N/A] Wound Margin: [1:N/A] [2:Large (67-100%)] [N/A:N/A] Granulation A mount: [1:N/A] [2:Red, Pink] [N/A:N/A] Granulation Quality: [1:N/A] [2:Small (1-33%)] [N/A:N/A] Necrotic A mount: [1:N/A] [2:Small (1-33%)] [N/A:N/A] Epithelialization: [1:N/A] [2:Compression Therapy] [N/A:N/A] Treatment Notes Electronic Signature(s) Signed: 12/26/2020 4:56:05 PM By: Autumn Hurst RN, BSN Signed: 12/26/2020 4:58:19 PM By: Autumn Ham MD Entered By: Autumn Massey on 12/26/2020 16:44:13 -------------------------------------------------------------------------------- Multi-Disciplinary Care Plan Details Patient Name: Date of Service: Autumn Massey, [name] 12/26/2020 3:15 PM Medical Record Number: [id number] Patient Account Number: [id number] Date of Birth/Sex: Treating RN: 08-11-1965 (55 y.o. Autumn Massey Primary Care Kattie Santoyo: Autumn Massey Other  Clinician: Referring Lus Kriegel: Treating Autumn Massey/Extender: Autumn Massey in Treatment: 1 Active Inactive Orientation to the Wound Care Program Nursing Diagnoses: Knowledge deficit related to the wound healing center program Goals: Patient/caregiver will verbalize understanding of the [facility] Date Initiated: 12/14/2020 Target Resolution Date: 01/11/2021 Goal Status: Active Interventions: Provide education on orientation to the wound center Notes: Venous Leg Ulcer Nursing Diagnoses: Actual venous Insuffiency (use after diagnosis is confirmed) Goals: Patient will maintain optimal edema control Date Initiated: 12/14/2020 Target Resolution Date: 01/11/2021 Goal Status: Active Interventions: Assess peripheral edema status every visit. Compression as ordered Provide education on venous insufficiency Treatment Activities: Therapeutic compression applied : 12/14/2020 Notes: Wound/Skin Impairment Nursing Diagnoses: Impaired tissue integrity Goals: Patient/caregiver will verbalize understanding of skin care regimen Date Initiated: 12/14/2020 Target Resolution Date: 01/11/2021 Goal Status: Active Ulcer/skin breakdown will have a volume reduction of 30% by week 4 Date Initiated: 12/14/2020 Target Resolution Date: 01/11/2021 Goal Status: Active Interventions: Assess patient/caregiver ability to obtain necessary supplies Assess patient/caregiver ability to perform ulcer/skin care regimen upon admission and as needed Assess ulceration(s) every visit Provide education on ulcer and skin care Treatment Activities: Skin care regimen initiated : 12/14/2020 Topical wound management initiated : 12/14/2020 Notes: Electronic Signature(s) Signed: 12/26/2020 4:56:05 PM By: Autumn Hurst RN, BSN Entered By: Autumn Massey on 12/26/2020 16:04:39 -------------------------------------------------------------------------------- Pain Assessment Details Patient  Name: Date of Service: Autumn Massey, [name] 12/26/2020 3:15 PM Medical Record Number: [id number] Patient Account Number: [id number] Date of Birth/Sex: Treating RN: October 04, 1965 (55 y.o. [name] Primary Care Antonis Lor: Autumn Massey Other Clinician: Referring Gracia Saggese: Treating Eboni Coval/Extender: Autumn Massey in Treatment: 1 Active Problems Location of Pain Severity and Description of Pain Patient Has Paino No Site Locations Pain Management and Medication Current Pain Management: Notes per patient when the leg has been dependent for a period of time. Electronic Signature(s) Signed: 12/26/2020 6:10:10 PM By: Deon Pilling Entered By: Deon Pilling on 12/26/2020 15:23:22 -------------------------------------------------------------------------------- Patient/Caregiver Education Details Patient Name: Date of Service: [name] NDA D. 6/29/2022andnbsp3:15 PM Medical Record Number: [id number] Patient Account Number: [id number] Date of Birth/Gender: Treating RN: 1965-08-28 (55 y.o. Autumn Massey Primary Care Physician: Autumn Massey Other Clinician: Referring Physician: Treating Physician/Extender: Autumn Massey in Treatment: 1 Education Assessment Education Provided To: Patient Education Topics Provided Wound/Skin Impairment: Methods: Explain/Verbal Responses: State content correctly Electronic Signature(s) Signed: 12/26/2020 4:56:05 PM By: Autumn Hurst RN, BSN Entered By: Autumn Massey on 12/26/2020 16:04:54 -------------------------------------------------------------------------------- Wound Assessment Details Patient Name: Date of Service: Autumn Massey, [name] 12/26/2020 3:15 PM Medical Record Number: [id number] Patient Account Number: [id number] Date of Birth/Sex: Treating RN: 1966-04-24 (55 y.o. [name] Primary Care Hailynn Slovacek: Autumn Massey Other Clinician: Referring  Cameshia Cressman: Treating Judiann Celia/Extender:  Autumn Massey in Treatment: 1 Wound Status Wound Number: 1 Primary Etiology: Diabetic Wound/Ulcer of the Lower Extremity Wound Location: Right, Lateral Lower Leg Secondary Etiology: Venous Leg Ulcer Wounding Event: Trauma Wound Status: Open Date Acquired: 08/09/2020 Weeks Of Treatment: 1 Clustered Wound: No Wound Measurements Length: (cm) Width: (cm) Depth: (cm) Area: (cm) Volume: (cm) Wound Description Classification: Grade 1 0 % Reduction in Area: 100% 0 % Reduction in Volume: 100% 0 0 0 Electronic Signature(s) Signed: 12/26/2020 6:10:10 PM By: Deon Pilling Entered By: Deon Pilling on 12/26/2020 15:23:56 -------------------------------------------------------------------------------- Wound Assessment Details Patient Name: Date of Service: Autumn Massey, [name] 12/26/2020 3:15 PM Medical Record Number: [id number] Patient Account Number: [id number] Date of Birth/Sex: Treating RN: 06/02/1966 (55 y.o. Autumn Massey, Autumn Massey Primary Care Carollynn Pennywell: Autumn Massey Other Clinician: Referring Tija Biss: Treating Arrow Emmerich/Extender: Autumn Massey in Treatment: 1 Wound Status Wound Number: 2 Primary Diabetic Wound/Ulcer of the Lower Extremity Etiology: Wound Location: Right, Lateral, Posterior Lower Leg Secondary Venous Leg Ulcer Wounding Event: Trauma Etiology: Date Acquired: 08/09/2020 Wound Open Weeks Of Treatment: 1 Status: Clustered Wound: No Comorbid Asthma, Congestive Heart Failure, Hypertension, Type II History: Diabetes, Osteoarthritis, Neuropathy Photos Wound Measurements Length: (cm) 3.3 Width: (cm) 1 Depth: (cm) 0.1 Area: (cm) 2.592 Volume: (cm) 0.259 % Reduction in Area: 73.9% % Reduction in Volume: 73.9% Epithelialization: Small (1-33%) Tunneling: No Undermining: No Wound Description Classification: Grade 1 Wound Margin: Flat and Intact Exudate Amount: Medium Exudate Type: Serosanguineous Exudate Color: red,  brown Foul Odor After Cleansing: No Slough/Fibrino Yes Wound Bed Granulation Amount: Large (67-100%) Exposed Structure Granulation Quality: Red, Pink Fascia Exposed: No Necrotic Amount: Small (1-33%) Fat Layer (Subcutaneous Tissue) Exposed: Yes Necrotic Quality: Adherent Slough Tendon Exposed: No Muscle Exposed: No Joint Exposed: No Bone Exposed: No Electronic Signature(s) Signed: 12/26/2020 5:02:46 PM By: Sandre Kitty Signed: 12/26/2020 6:10:10 PM By: Deon Pilling Entered By: Sandre Kitty on 12/26/2020 16:44:51 -------------------------------------------------------------------------------- Vitals Details Patient Name: Date of Service: Autumn Massey, [name] 12/26/2020 3:15 PM Medical Record Number: [id number] Patient Account Number: [id number] Date of Birth/Sex: Treating RN: [birth year] (55 y.o. Autumn Massey, Autumn Massey Primary Care Amiir Heckard: Autumn Massey Other Clinician: Referring Aubreanna Percle: Treating Karlye Ihrig/Extender: Autumn Massey in Treatment: 1 Vital Signs Time Taken: 15:28 Temperature (F): 98.6 Height (in): 62 Pulse (bpm): 69 Weight (lbs): 319 Respiratory Rate (breaths/min): 22 Body Mass Index (BMI): 58.3 Blood Pressure (mmHg): 133/83 Capillary Blood Glucose (mg/dl): 160 Reference Range: 80 - 120 mg / dl Electronic Signature(s) Signed: 12/26/2020 6:10:10 PM By: Deon Pilling Entered By: Deon Pilling on 12/26/2020 15:28:45

## 2021-01-02 ENCOUNTER — Encounter (HOSPITAL_BASED_OUTPATIENT_CLINIC_OR_DEPARTMENT_OTHER): Payer: Medicare Other | Attending: Internal Medicine | Admitting: Internal Medicine

## 2021-01-02 ENCOUNTER — Other Ambulatory Visit: Payer: Self-pay

## 2021-01-02 DIAGNOSIS — S81801S Unspecified open wound, right lower leg, sequela: Secondary | ICD-10-CM | POA: Diagnosis not present

## 2021-01-02 DIAGNOSIS — L03115 Cellulitis of right lower limb: Secondary | ICD-10-CM | POA: Diagnosis not present

## 2021-01-02 DIAGNOSIS — E119 Type 2 diabetes mellitus without complications: Secondary | ICD-10-CM | POA: Diagnosis not present

## 2021-01-02 DIAGNOSIS — L97818 Non-pressure chronic ulcer of other part of right lower leg with other specified severity: Secondary | ICD-10-CM | POA: Diagnosis not present

## 2021-01-02 DIAGNOSIS — L249 Irritant contact dermatitis, unspecified cause: Secondary | ICD-10-CM | POA: Insufficient documentation

## 2021-01-02 DIAGNOSIS — I87321 Chronic venous hypertension (idiopathic) with inflammation of right lower extremity: Secondary | ICD-10-CM | POA: Diagnosis not present

## 2021-01-02 DIAGNOSIS — G609 Hereditary and idiopathic neuropathy, unspecified: Secondary | ICD-10-CM | POA: Insufficient documentation

## 2021-01-02 DIAGNOSIS — L97819 Non-pressure chronic ulcer of other part of right lower leg with unspecified severity: Secondary | ICD-10-CM | POA: Diagnosis present

## 2021-01-02 DIAGNOSIS — Z8616 Personal history of COVID-19: Secondary | ICD-10-CM | POA: Diagnosis not present

## 2021-01-02 DIAGNOSIS — L97212 Non-pressure chronic ulcer of right calf with fat layer exposed: Secondary | ICD-10-CM | POA: Diagnosis not present

## 2021-01-02 DIAGNOSIS — W2203XS Walked into furniture, sequela: Secondary | ICD-10-CM | POA: Diagnosis not present

## 2021-01-02 DIAGNOSIS — E11622 Type 2 diabetes mellitus with other skin ulcer: Secondary | ICD-10-CM | POA: Diagnosis not present

## 2021-01-02 NOTE — Progress Notes (Signed)
Autumn Massey, Autumn Massey ([id number]) Visit Report for 01/02/2021 Arrival Information Details Patient Name: Date of Service: Autumn Massey, [name] 01/02/2021 1:00 PM Medical Record Number: [id number] Patient Account Number: [id number] Date of Birth/Sex: Treating RN: 1965/09/15 (55 y.o. Benjamine Sprague, Briant Cedar Primary Care Lucine Bilski: [name] Other Clinician: Referring Kendal Ghazarian: Treating Kippy Gohman/Extender: [name] in Treatment: 2 Visit Information History Since Last Visit Added or deleted any medications: No Patient Arrived: Ambulatory Any new allergies or adverse reactions: No Arrival Time: 13:08 Had a fall or experienced change in No Accompanied By: alone activities of daily living that may affect Transfer Assistance: None risk of falls: Patient Identification Verified: Yes Signs or symptoms of abuse/neglect since last visito No Secondary Verification Process Completed: Yes Hospitalized since last visit: No Patient Requires Transmission-Based Precautions: No Implantable device outside of the clinic excluding No Patient Has Alerts: No cellular tissue based products placed in the center since last visit: Has Dressing in Place as Prescribed: Yes Has Compression in Place as Prescribed: Yes Pain Present Now: No Electronic Signature(s) Signed: 01/02/2021 7:11:13 PM By: Levan Hurst RN, BSN Entered By: Levan Hurst on 01/02/2021 13:08:18 -------------------------------------------------------------------------------- Compression Therapy Details Patient Name: Date of Service: Autumn Massey, [name] 01/02/2021 1:00 PM Medical Record Number: [id number] Patient Account Number: [id number] Date of Birth/Sex: Treating RN: 04/18/1966 (55 y.o. Autumn Massey Primary Care Lydell Moga: [name] Other Clinician: Referring Holy Battenfield: Treating Bonnie Overdorf/Extender: [name] in Treatment: 2 Compression Therapy Performed for Wound Assessment: Wound #2  Right,Lateral,Posterior Lower Leg Performed By: Clinician Levan Hurst, RN Compression Type: Three Layer Post Procedure Diagnosis Same as Pre-procedure Electronic Signature(s) Signed: 01/02/2021 7:11:13 PM By: Levan Hurst RN, BSN Entered By: Levan Hurst on 01/02/2021 13:27:20 -------------------------------------------------------------------------------- Encounter Discharge Information Details Patient Name: Date of Service: Autumn Massey, [name] 01/02/2021 1:00 PM Medical Record Number: [id number] Patient Account Number: [id number] Date of Birth/Sex: Treating RN: 07-22-1965 (55 y.o. Autumn Massey Primary Care Ceri Mayer: [name] Other Clinician: Referring Khyan Oats: Treating Yumiko Alkins/Extender: [name] in Treatment: 2 Encounter Discharge Information Items Discharge Condition: Stable Ambulatory Status: Ambulatory Discharge Destination: Home Transportation: Private Auto Accompanied By: alone Schedule Follow-up Appointment: Yes Clinical Summary of Care: Patient Declined Electronic Signature(s) Signed: 01/02/2021 7:11:13 PM By: Levan Hurst RN, BSN Entered By: Levan Hurst on 01/02/2021 18:24:46 -------------------------------------------------------------------------------- Lower Extremity Assessment Details Patient Name: Date of Service: Autumn Massey, [name] 01/02/2021 1:00 PM Medical Record Number: [id number] Patient Account Number: [id number] Date of Birth/Sex: Treating RN: 04/30/1966 (55 y.o. Autumn Massey Primary Care Oronde Hallenbeck: [name] Other Clinician: Referring Autumn Massey: Treating Autumn Massey/Extender: [name] in Treatment: 2 Edema Assessment Assessed: Shirlyn Goltz: No] Patrice Paradise: No] Edema: [Left: Ye] [Right: s] Calf Left: Right: Point of Measurement: From Medial Instep 39 cm Ankle Left: Right: Point of Measurement: From Medial Instep 23 cm Vascular Assessment Pulses: Dorsalis Pedis Palpable:  [Right:Yes] Electronic Signature(s) Signed: 01/02/2021 7:11:13 PM By: Levan Hurst RN, BSN Entered By: Levan Hurst on 01/02/2021 13:08:56 -------------------------------------------------------------------------------- Multi Wound Chart Details Patient Name: Date of Service: Autumn Massey, [name] 01/02/2021 1:00 PM Medical Record Number: [id number] Patient Account Number: [id number] Date of Birth/Sex: Treating RN: 1966-02-03 (55 y.o. Autumn Massey Primary Care Autumn Massey: [name] Other Clinician: Referring Autumn Massey: Treating Autumn Massey/Extender: [name] in Treatment: 2 Vital Signs Height(in): 62 Capillary Blood Glucose(mg/dl): 139 Weight(lbs): 319 Pulse(bpm): 72 Body Mass Index(BMI): 52 Blood Pressure(mmHg): 113/61 Temperature(F): 98.5 Respiratory Rate(breaths/min): 18 Photos: [2:No Photos Right, Lateral, Posterior Lower  Leg N/A] [N/A:N/A] Wound Location: [2:Trauma] [N/A:N/A] Wounding Event: [2:Diabetic Wound/Ulcer of the Lower] [N/A:N/A] Primary Etiology: [2:Extremity Venous Leg Ulcer] [N/A:N/A] Secondary Etiology: [2:Asthma, Congestive Heart Failure,] [N/A:N/A] Comorbid History: [2:Hypertension, Type II Diabetes, Osteoarthritis, Neuropathy 08/09/2020] [N/A:N/A] Date Acquired: [2:2] [N/A:N/A] Weeks of Treatment: [2:Open] [N/A:N/A] Wound Status: [2:2x0.8x0.1] [N/A:N/A] Measurements L x W x D (cm) [2:1.257] [N/A:N/A] A (cm) : rea [2:0.126] [N/A:N/A] Volume (cm) : [2:87.30%] [N/A:N/A] % Reduction in A rea: [2:87.30%] [N/A:N/A] % Reduction in Volume: [2:Grade 1] [N/A:N/A] Classification: [2:Medium] [N/A:N/A] Exudate A mount: [2:Serosanguineous] [N/A:N/A] Exudate Type: [2:red, brown] [N/A:N/A] Exudate Color: [2:Flat and Intact] [N/A:N/A] Wound Margin: [2:Large (67-100%)] [N/A:N/A] Granulation A mount: [2:Red, Pink] [N/A:N/A] Granulation Quality: [2:Small (1-33%)] [N/A:N/A] Necrotic A mount: [2:Fat Layer (Subcutaneous Tissue): Yes  N/A] Exposed Structures: [2:Fascia: No Tendon: No Muscle: No Joint: No Bone: No Medium (34-66%)] [N/A:N/A] Epithelialization: [2:Compression Therapy] [N/A:N/A] Treatment Notes Electronic Signature(s) Signed: 01/02/2021 5:13:41 PM By: Linton Ham MD Signed: 01/02/2021 7:11:13 PM By: Levan Hurst RN, BSN Entered By: Linton Ham on 01/02/2021 13:29:41 -------------------------------------------------------------------------------- Multi-Disciplinary Care Plan Details Patient Name: Date of Service: Autumn Massey, [name] 01/02/2021 1:00 PM Medical Record Number: [id number] Patient Account Number: [id number] Date of Birth/Sex: Treating RN: August 07, 1965 (55 y.o. Autumn Massey Primary Care Declynn Lopresti: Other Clinician: [name] Referring Wyett Narine: Treating Ahleah Simko/Extender: [name] in Treatment: 2 Active Inactive Orientation to the Wound Care Program Nursing Diagnoses: Knowledge deficit related to the wound healing center program Goals: Patient/caregiver will verbalize understanding of the [place] Date Initiated: 12/14/2020 Target Resolution Date: 01/11/2021 Goal Status: Active Interventions: Provide education on orientation to the wound center Notes: Venous Leg Ulcer Nursing Diagnoses: Actual venous Insuffiency (use after diagnosis is confirmed) Goals: Patient will maintain optimal edema control Date Initiated: 12/14/2020 Target Resolution Date: 01/11/2021 Goal Status: Active Interventions: Assess peripheral edema status every visit. Compression as ordered Provide education on venous insufficiency Treatment Activities: Therapeutic compression applied : 12/14/2020 Notes: Wound/Skin Impairment Nursing Diagnoses: Impaired tissue integrity Goals: Patient/caregiver will verbalize understanding of skin care regimen Date Initiated: 12/14/2020 Target Resolution Date: 01/11/2021 Goal Status: Active Ulcer/skin breakdown will have a  volume reduction of 30% by week 4 Date Initiated: 12/14/2020 Target Resolution Date: 01/11/2021 Goal Status: Active Interventions: Assess patient/caregiver ability to obtain necessary supplies Assess patient/caregiver ability to perform ulcer/skin care regimen upon admission and as needed Assess ulceration(s) every visit Provide education on ulcer and skin care Treatment Activities: Skin care regimen initiated : 12/14/2020 Topical wound management initiated : 12/14/2020 Notes: Electronic Signature(s) Signed: 01/02/2021 7:11:13 PM By: Levan Hurst RN, BSN Entered By: Levan Hurst on 01/02/2021 18:23:37 -------------------------------------------------------------------------------- Pain Assessment Details Patient Name: Date of Service: Autumn Massey, [name] 01/02/2021 1:00 PM Medical Record Number: [id number] Patient Account Number: [id number] Date of Birth/Sex: Treating RN: 1966-02-09 (55 y.o. Autumn Massey Primary Care Adelee Hannula: [name] Other Clinician: Referring Katurah Karapetian: Treating Bali Lyn/Extender: [name] in Treatment: 2 Active Problems Location of Pain Severity and Description of Pain Patient Has Paino No Site Locations Pain Management and Medication Current Pain Management: Electronic Signature(s) Signed: 01/02/2021 7:11:13 PM By: Levan Hurst RN, BSN Entered By: Levan Hurst on 01/02/2021 13:08:47 -------------------------------------------------------------------------------- Patient/Caregiver Education Details Patient Name: Date of Service: [name] NDA D. 7/6/2022andnbsp1:00 PM Medical Record Number: [id number] Patient Account Number: [id number] Date of Birth/Gender: Treating RN: 03/18/1966 (55 y.o. Autumn Massey Primary Care Physician: [name] Other Clinician: Referring Physician: Treating Physician/Extender: [name] in Treatment: 2 Education Assessment  Education Provided  To: Patient Education Topics Provided Wound/Skin Impairment: Methods: Explain/Verbal Responses: State content correctly Electronic Signature(s) Signed: 01/02/2021 7:11:13 PM By: Levan Hurst RN, BSN Entered By: Levan Hurst on 01/02/2021 18:23:49 -------------------------------------------------------------------------------- Wound Assessment Details Patient Name: Date of Service: Autumn Massey, [name] 01/02/2021 1:00 PM Medical Record Number: [id number] Patient Account Number: [id number] Date of Birth/Sex: Treating RN: [birth year] (55 y.o. Autumn Massey Primary Care Qiana Landgrebe: [name] Other Clinician: Referring Sachit Gilman: Treating Valena Ivanov/Extender: [name] in Treatment: 2 Wound Status Wound Number: 2 Primary Diabetic Wound/Ulcer of the Lower Extremity Etiology: Wound Location: Right, Lateral, Posterior Lower Leg Secondary Venous Leg Ulcer Wounding Event: Trauma Etiology: Date Acquired: 08/09/2020 Wound Open Weeks Of Treatment: 2 Status: Clustered Wound: No Comorbid Asthma, Congestive Heart Failure, Hypertension, Type II History: Diabetes, Osteoarthritis, Neuropathy Photos Wound Measurements Length: (cm) 2 Width: (cm) 0.8 Depth: (cm) 0.1 Area: (cm) 1.257 Volume: (cm) 0.126 % Reduction in Area: 87.3% % Reduction in Volume: 87.3% Epithelialization: Medium (34-66%) Tunneling: No Undermining: No Wound Description Classification: Grade 1 Wound Margin: Flat and Intact Exudate Amount: Medium Exudate Type: Serosanguineous Exudate Color: red, brown Foul Odor After Cleansing: No Slough/Fibrino Yes Wound Bed Granulation Amount: Large (67-100%) Exposed Structure Granulation Quality: Red, Pink Fascia Exposed: No Necrotic Amount: Small (1-33%) Fat Layer (Subcutaneous Tissue) Exposed: Yes Necrotic Quality: Adherent Slough Tendon Exposed: No Muscle Exposed: No Joint Exposed: No Bone Exposed: No Treatment Notes Wound #2 (Lower Leg)  Wound Laterality: Right, Lateral, Posterior Cleanser Soap and Water Discharge Instruction: May shower and wash wound with dial antibacterial soap and water prior to dressing change. Wound Cleanser Discharge Instruction: Cleanse the wound with wound cleanser prior to applying a clean dressing using gauze sponges, not tissue or cotton balls. Peri-Wound Care Triamcinolone 15 (g) Discharge Instruction: Apply liberally to red areas on leg Sween Lotion (Moisturizing lotion) Discharge Instruction: Apply moisturizing lotion as directed Topical Primary Dressing Cutimed Sorbact Swab Discharge Instruction: Apply to wound bed as instructed Secondary Dressing Woven Gauze Sponge, Non-Sterile 4x4 in Discharge Instruction: Apply over primary dressing as directed. Secured With Compression Wrap ThreePress (3 layer compression wrap) Discharge Instruction: Apply three layer compression as directed. Compression Stockings Add-Ons Electronic Signature(s) Signed: 01/02/2021 5:22:20 PM By: Sandre Kitty Signed: 01/02/2021 7:11:13 PM By: Levan Hurst RN, BSN Entered By: Sandre Kitty on 01/02/2021 17:02:17 -------------------------------------------------------------------------------- [place] Details Patient Name: Date of Service: Autumn Massey, [name] 01/02/2021 1:00 PM Medical Record Number: [id number] Patient Account Number: [id number] Date of Birth/Sex: Treating RN: 1965-08-28 (55 y.o. Autumn Massey Primary Care Susan Bleich: [name] Other Clinician: Referring Raney Antwine: Treating Lyrick Worland/Extender: [name] in Treatment: 2 Vital Signs Time Taken: 13:08 Temperature (F): 98.5 Height (in): 62 Pulse (bpm): 72 Weight (lbs): 319 Respiratory Rate (breaths/min): 18 Body Mass Index (BMI): 58.3 Blood Pressure (mmHg): 113/61 Capillary Blood Glucose (mg/dl): 139 Reference Range: 80 - 120 mg / dl Notes glucose per pt report Electronic Signature(s) Signed: 01/02/2021  7:11:13 PM By: Levan Hurst RN, BSN Entered By: Levan Hurst on 01/02/2021 13:08:43

## 2021-01-03 NOTE — Progress Notes (Signed)
CHRISTIAN, TREADWAY ([id number]) Visit Report for 01/02/2021 HPI Details Patient Name: Date of Service: [place], [name] 01/02/2021 1:00 PM Medical Record Number: [id number] Patient Account Number: [id number] Date of Birth/Sex: Treating RN: December 21, 1965 (55 y.o. [name] Primary Care Provider: [name] Other Clinician: Referring Provider: Treating Provider/Extender: [name] in Treatment: 2 History of Present Illness HPI Description: ADMISSION 12/14/2020 This is a pleasant 55 year old woman who is here for our review of wound on the right lateral calf. She is a type II diabetic. She said this started when she traumatized her leg hitting it on the bed frame in February. This was painful. She was left with open wounds. She initially used topical antibiotics but found this was not happening more recently she has been using a combination of diluted hydrogen peroxide, and antibiotic pads. She has support hose but does not wear them. The patient complains of a lot of pain sometimes waking her up tonight at night with a sharp jabbing discomfort. She has neuropathy but this may not be secondary to her diabetes rather idiopathic peripheral neuropathy is also listed on her problem list. Past medical history includes type 2 diabetes not on insulin last hemoglobin A1c of 8, COVID in February 2022 with chronic shortness of breath. At 1 point she was listed as having COPD although she thinks this is more asthma, hypothyroidism, hyperlipidemia, nighttime oxygen, essential tremor hearing loss and hypertension. She had a lower extremity DVT study on 10/03/2020 and although it was a limited study because of pannus folds over her upper thighs there was no specific evidence of DVT on the right side. ABI in our clinic on the right was 0.94 6/24; patient be admitted to the clinic last week. She had 2 areas on her right lateral lower leg. Substantial amount of erythema around both of  these wounds which could have been cellulitis or contact dermatitis. I favor the latter but covered her with antibiotics in any case. She arrives in clinic today things look a lot better especially her skin. Wounds are smaller. We use silver alginate 6/29; the patient is down to 1 area on the right lateral lower leg. The erythema around this is totally resolved this was likely a contact dermatitis. I been using silver alginate 7/6; wound on the right lateral lower leg is much smaller. No erythema. Her edema control is excellent Electronic Signature(s) Signed: 01/02/2021 5:13:41 PM By: Linton Ham MD Entered By: Linton Ham on 01/02/2021 13:30:13 -------------------------------------------------------------------------------- Physical Exam Details Patient Name: Date of Service: [name], [name] 01/02/2021 1:00 PM Medical Record Number: [id number] Patient Account Number: [id number] Date of Birth/Sex: Treating RN: [birth year] (55 y.o. [name] Primary Care Provider: [name] Other Clinician: Referring Provider: Treating Provider/Extender: [name] in Treatment: 2 Constitutional Sitting or standing Blood Pressure is within target range for patient.. Pulse regular and within target range for patient.[name] Respirations regular, non-labored and within target range.. Temperature is normal and within the target range for the patient.[name] Appears in no distress. Cardiovascular Pedal pulses are palpable. Notes Wound exam; right lateral calf. Under illumination the surface of this looks healthy. No evidence of surrounding infection. We have excellent edema control and no evidence of infection around the wound Electronic Signature(s) Signed: 01/02/2021 5:13:41 PM By: Linton Ham MD Entered By: Linton Ham on 01/02/2021 13:31:01 -------------------------------------------------------------------------------- Physician Orders Details Patient Name: Date of  Service: [name], [name] 01/02/2021 1:00 PM Medical Record Number: [id number] Patient Account  Number: [id number] Date of Birth/Sex: Treating RN: 02/08/1966 (55 y.o. [name] Primary Care Provider: [name] Other Clinician: Referring Provider: Treating Provider/Extender: [name] in Treatment: 2 Verbal / Phone Orders: No Diagnosis Coding ICD-10 Coding Code Description [phone] Chronic venous hypertension (idiopathic) with inflammation of right lower extremity L97.818 Non-pressure chronic ulcer of other part of right lower leg with other specified severity L03.115 Cellulitis of right lower limb L24.5 Irritant contact dermatitis due to other chemical products Follow-up Appointments ppointment in 1 week. - with Dr. Dellia Nims Return A Bathing/ Shower/ Hygiene May shower with protection but do not get wound dressing(s) wet. Edema Control - Lymphedema / SCD / Other Elevate legs to the level of the heart or above for 30 minutes daily and/or when sitting, a frequency of: - throughout the day Avoid standing for long periods of time. Additional Orders / Instructions Follow Nutritious Diet - Increase protein, 100-120g of protein. Wound Treatment Wound #2 - Lower Leg Wound Laterality: Right, Lateral, Posterior Cleanser: Soap and Water 1 x Per Week/30 Days Discharge Instructions: May shower and wash wound with dial antibacterial soap and water prior to dressing change. Cleanser: Wound Cleanser 1 x Per Week/30 Days Discharge Instructions: Cleanse the wound with wound cleanser prior to applying a clean dressing using gauze sponges, not tissue or cotton balls. Peri-Wound Care: Triamcinolone 15 (g) 1 x Per Week/30 Days Discharge Instructions: Apply liberally to red areas on leg Peri-Wound Care: Sween Lotion (Moisturizing lotion) 1 x Per Week/30 Days Discharge Instructions: Apply moisturizing lotion as directed Prim Dressing: Cutimed Sorbact Swab 1 x Per Week/30  Days ary Discharge Instructions: Apply to wound bed as instructed Secondary Dressing: Woven Gauze Sponge, Non-Sterile 4x4 in 1 x Per Week/30 Days Discharge Instructions: Apply over primary dressing as directed. Compression Wrap: ThreePress (3 layer compression wrap) 1 x Per Week/30 Days Discharge Instructions: Apply three layer compression as directed. Electronic Signature(s) Signed: 01/02/2021 5:13:41 PM By: Linton Ham MD Signed: 01/02/2021 7:11:13 PM By: Levan Hurst RN, BSN Entered By: Levan Hurst on 01/02/2021 13:26:19 -------------------------------------------------------------------------------- Problem List Details Patient Name: Date of Service: [name], [name] 01/02/2021 1:00 PM Medical Record Number: [id number] Patient Account Number: [id number] Date of Birth/Sex: Treating RN: Nov 17, 1965 (55 y.o. [name] Primary Care Provider: [name] Other Clinician: Referring Provider: Treating Provider/Extender: [name] in Treatment: 2 Active Problems ICD-10 Encounter Code Description Active Date MDM Diagnosis I87.321 Chronic venous hypertension (idiopathic) with inflammation of right lower 12/14/2020 No Yes extremity L97.818 Non-pressure chronic ulcer of other part of right lower leg with other specified 12/14/2020 No Yes severity L03.115 Cellulitis of right lower limb 12/14/2020 No Yes L24.5 Irritant contact dermatitis due to other chemical products 12/14/2020 No Yes Inactive Problems Resolved Problems Electronic Signature(s) Signed: 01/02/2021 5:13:41 PM By: Linton Ham MD Entered By: Linton Ham on 01/02/2021 13:29:36 -------------------------------------------------------------------------------- Progress Note Details Patient Name: Date of Service: [name], [name] 01/02/2021 1:00 PM Medical Record Number: [id number] Patient Account Number: [id number] Date of Birth/Sex: Treating RN: 23-Oct-1965 (55 y.o. [name] Primary Care Provider: [name] Other Clinician: Referring Provider: Treating Provider/Extender: [name] in Treatment: 2 Subjective History of Present Illness (HPI) ADMISSION 12/14/2020 This is a pleasant 54 year old woman who is here for our review of wound on the right lateral calf. She is a type II diabetic. She said this started when she traumatized her leg hitting it on the bed frame in February. This was painful.  She was left with open wounds. She initially used topical antibiotics but found this was not happening more recently she has been using a combination of diluted hydrogen peroxide, and antibiotic pads. She has support hose but does not wear them. The patient complains of a lot of pain sometimes waking her up tonight at night with a sharp jabbing discomfort. She has neuropathy but this may not be secondary to her diabetes rather idiopathic peripheral neuropathy is also listed on her problem list. Past medical history includes type 2 diabetes not on insulin last hemoglobin A1c of 8, COVID in February 2022 with chronic shortness of breath. At 1 point she was listed as having COPD although she thinks this is more asthma, hypothyroidism, hyperlipidemia, nighttime oxygen, essential tremor hearing loss and hypertension. She had a lower extremity DVT study on 10/03/2020 and although it was a limited study because of pannus folds over her upper thighs there was no specific evidence of DVT on the right side. ABI in our clinic on the right was 0.94 6/24; patient be admitted to the clinic last week. She had 2 areas on her right lateral lower leg. Substantial amount of erythema around both of these wounds which could have been cellulitis or contact dermatitis. I favor the latter but covered her with antibiotics in any case. She arrives in clinic today things look a lot better especially her skin. Wounds are smaller. We use silver alginate 6/29; the patient is  down to 1 area on the right lateral lower leg. The erythema around this is totally resolved this was likely a contact dermatitis. I been using silver alginate 7/6; wound on the right lateral lower leg is much smaller. No erythema. Her edema control is excellent Objective Constitutional Sitting or standing Blood Pressure is within target range for patient.. Pulse regular and within target range for patient.[name] Respirations regular, non-labored and within target range.. Temperature is normal and within the target range for the patient.[name] Appears in no distress. Vitals Time Taken: 1:08 PM, Height: 62 in, Weight: 319 lbs, BMI: 58.3, Temperature: 98.5 F, Pulse: 72 bpm, Respiratory Rate: 18 breaths/min, Blood Pressure: 113/61 mmHg, Capillary Blood Glucose: 139 mg/dl. General Notes: glucose per pt report Cardiovascular Pedal pulses are palpable. General Notes: Wound exam; right lateral calf. Under illumination the surface of this looks healthy. No evidence of surrounding infection. We have excellent edema control and no evidence of infection around the wound Integumentary (Hair, Skin) Wound #2 status is Open. Original cause of wound was Trauma. The date acquired was: 08/09/2020. The wound has been in treatment 2 weeks. The wound is located on the Right,Lateral,Posterior Lower Leg. The wound measures 2cm length x 0.8cm width x 0.1cm depth; 1.257cm^2 area and 0.126cm^3 volume. There is Fat Layer (Subcutaneous Tissue) exposed. There is no tunneling or undermining noted. There is a medium amount of serosanguineous drainage noted. The wound margin is flat and intact. There is large (67-100%) red, pink granulation within the wound bed. There is a small (1-33%) amount of necrotic tissue within the wound bed including Adherent Slough. Assessment Active Problems ICD-10 Chronic venous hypertension (idiopathic) with inflammation of right lower extremity Non-pressure chronic ulcer of other part of right lower leg  with other specified severity Cellulitis of right lower limb Irritant contact dermatitis due to other chemical products Procedures Wound #2 Pre-procedure diagnosis of Wound #2 is a Diabetic Wound/Ulcer of the Lower Extremity located on the Right,Lateral,Posterior Lower Leg . There was a Three Layer Compression Therapy Procedure by Donnal Debar,  Briant Cedar, RN. Post procedure Diagnosis Wound #2: Same as Pre-Procedure Plan Follow-up Appointments: Return Appointment in 1 week. - with Dr. Arcola Jansky Shower/ Hygiene: May shower with protection but do not get wound dressing(s) wet. Edema Control - Lymphedema / SCD / Other: Elevate legs to the level of the heart or above for 30 minutes daily and/or when sitting, a frequency of: - throughout the day Avoid standing for long periods of time. Additional Orders / Instructions: Follow Nutritious Diet - Increase protein, 100-120g of protein. WOUND #2: - Lower Leg Wound Laterality: Right, Lateral, Posterior Cleanser: Soap and Water 1 x Per Week/30 Days Discharge Instructions: May shower and wash wound with dial antibacterial soap and water prior to dressing change. Cleanser: Wound Cleanser 1 x Per Week/30 Days Discharge Instructions: Cleanse the wound with wound cleanser prior to applying a clean dressing using gauze sponges, not tissue or cotton balls. Peri-Wound Care: Triamcinolone 15 (g) 1 x Per Week/30 Days Discharge Instructions: Apply liberally to red areas on leg Peri-Wound Care: Sween Lotion (Moisturizing lotion) 1 x Per Week/30 Days Discharge Instructions: Apply moisturizing lotion as directed Prim Dressing: Cutimed Sorbact Swab 1 x Per Week/30 Days ary Discharge Instructions: Apply to wound bed as instructed Secondary Dressing: Woven Gauze Sponge, Non-Sterile 4x4 in 1 x Per Week/30 Days Discharge Instructions: Apply over primary dressing as directed. Com pression Wrap: ThreePress (3 layer compression wrap) 1 x Per Week/30 Days Discharge  Instructions: Apply three layer compression as directed. 1. Continuing with the Sorbact under 3 layer compression. 2. The wound was smaller today and the surface looks better. We could conceivably change to [facility] if this stalls although quite an improvement this week. 3. No evidence of infection the degree of edema control here is excellent. 4. We talked to her about stockings and she is going to go ahead and order these Electronic Signature(s) Signed: 01/02/2021 5:13:41 PM By: Linton Ham MD Entered By: Linton Ham on 01/02/2021 13:31:57 -------------------------------------------------------------------------------- SuperBill Details Patient Name: Date of Service: [name], [name] 01/02/2021 Medical Record Number: [id number] Patient Account Number: [id number] Date of Birth/Sex: Treating RN: 08/24/1965 (55 y.o. [name] Primary Care Provider: [name] Other Clinician: Referring Provider: Treating Provider/Extender: [name] in Treatment: 2 Diagnosis Coding ICD-10 Codes Code Description [phone] Chronic venous hypertension (idiopathic) with inflammation of right lower extremity L97.818 Non-pressure chronic ulcer of other part of right lower leg with other specified severity L03.115 Cellulitis of right lower limb L24.5 Irritant contact dermatitis due to other chemical products Facility Procedures CPT4 Code: [id number] Description: (Facility Use Only) [phone] - [place] [facility] RT LEG Modifier: Quantity: 1 Physician Procedures : CPT4 Code Description Modifier [id number] - WC PHYS LEVEL 3 - EST PT ICD-10 Diagnosis Description L97.818 Non-pressure chronic ulcer of other part of right lower leg with other specified severity I87.321 Chronic venous hypertension (idiopathic) with  inflammation of right lower extremity Quantity: 1 Electronic Signature(s) Signed: 01/02/2021 7:11:13 PM By: Levan Hurst RN, BSN Signed: 01/03/2021  8:35:52 PM By: Linton Ham MD Previous Signature: 01/02/2021 5:13:41 PM Version By: Linton Ham MD Entered By: Levan Hurst on 01/02/2021 18:24:08

## 2021-01-09 ENCOUNTER — Other Ambulatory Visit: Payer: Self-pay

## 2021-01-09 ENCOUNTER — Encounter (HOSPITAL_BASED_OUTPATIENT_CLINIC_OR_DEPARTMENT_OTHER): Payer: Medicare Other | Admitting: Internal Medicine

## 2021-01-09 DIAGNOSIS — L249 Irritant contact dermatitis, unspecified cause: Secondary | ICD-10-CM | POA: Diagnosis not present

## 2021-01-09 DIAGNOSIS — L97212 Non-pressure chronic ulcer of right calf with fat layer exposed: Secondary | ICD-10-CM | POA: Diagnosis not present

## 2021-01-09 DIAGNOSIS — E119 Type 2 diabetes mellitus without complications: Secondary | ICD-10-CM | POA: Diagnosis not present

## 2021-01-09 DIAGNOSIS — Z8616 Personal history of COVID-19: Secondary | ICD-10-CM | POA: Diagnosis not present

## 2021-01-09 DIAGNOSIS — L03115 Cellulitis of right lower limb: Secondary | ICD-10-CM | POA: Diagnosis not present

## 2021-01-09 DIAGNOSIS — E11622 Type 2 diabetes mellitus with other skin ulcer: Secondary | ICD-10-CM | POA: Diagnosis not present

## 2021-01-09 DIAGNOSIS — L97818 Non-pressure chronic ulcer of other part of right lower leg with other specified severity: Secondary | ICD-10-CM | POA: Diagnosis not present

## 2021-01-09 DIAGNOSIS — S81801S Unspecified open wound, right lower leg, sequela: Secondary | ICD-10-CM | POA: Diagnosis not present

## 2021-01-09 DIAGNOSIS — I87321 Chronic venous hypertension (idiopathic) with inflammation of right lower extremity: Secondary | ICD-10-CM | POA: Diagnosis not present

## 2021-01-10 NOTE — Progress Notes (Signed)
Autumn Massey, Autumn Massey ([id number]) Visit Report for 01/09/2021 Arrival Information Details Patient Name: Date of Service: [place], [name] 01/09/2021 3:15 PM Medical Record Number: [id number] Patient Account Number: [id number] Date of Birth/Sex: Treating RN: 10/30/1965 (55 y.o. Tonita Phoenix, Lauren Primary Care Trygve Thal: [name] Other Clinician: Referring Jessicia Napolitano: Treating Talley Casco/Extender: [name] in Treatment: 3 Visit Information History Since Last Visit Added or deleted any medications: No Patient Arrived: Ambulatory Any new allergies or adverse reactions: No Arrival Time: 15:40 Had a fall or experienced change in No Accompanied By: self activities of daily living that may affect Transfer Assistance: None risk of falls: Patient Identification Verified: Yes Signs or symptoms of abuse/neglect since last visito No Secondary Verification Process Completed: Yes Hospitalized since last visit: No Patient Requires Transmission-Based Precautions: No Implantable device outside of the clinic excluding No Patient Has Alerts: No cellular tissue based products placed in the center since last visit: Has Dressing in Place as Prescribed: Yes Pain Present Now: No Electronic Signature(s) Signed: 01/09/2021 6:12:45 PM By: Rhae Hammock RN Entered By: Rhae Hammock on 01/09/2021 15:41:04 -------------------------------------------------------------------------------- Compression Therapy Details Patient Name: Date of Service: Autumn Massey, Autumn Massey NDA D. 01/09/2021 3:15 PM Medical Record Number: [id number] Patient Account Number: [id number] Date of Birth/Sex: Treating RN: 1966-04-24 (55 y.o. [name] Primary Care Lavena Loretto: [name] Other Clinician: Referring Elaf Clauson: Treating Staphany Ditton/Extender: [name] in Treatment: 3 Compression Therapy Performed for Wound Assessment: Wound #2 Right,Lateral,Posterior Lower Leg Performed By:  Clinician Levan Hurst, RN Compression Type: Three Layer Post Procedure Diagnosis Same as Pre-procedure Electronic Signature(s) Signed: 01/10/2021 6:01:10 PM By: Levan Hurst RN, BSN Entered By: Levan Hurst on 01/09/2021 16:12:39 -------------------------------------------------------------------------------- Encounter Discharge Information Details Patient Name: Date of Service: Autumn Massey, Autumn Massey NDA D. 01/09/2021 3:15 PM Medical Record Number: [id number] Patient Account Number: [id number] Date of Birth/Sex: Treating RN: 30-Jun-1966 (55 y.o. Tonita Phoenix, Lauren Primary Care Keegan Ducey: [name] Other Clinician: Referring Dinita Migliaccio: Treating Mieko Kneebone/Extender: [name] in Treatment: 3 Encounter Discharge Information Items Discharge Condition: Stable Ambulatory Status: Ambulatory Discharge Destination: Home Transportation: Private Auto Accompanied By: self Schedule Follow-up Appointment: Yes Clinical Summary of Care: Patient Declined Electronic Signature(s) Signed: 01/09/2021 6:12:45 PM By: Rhae Hammock RN Entered By: Rhae Hammock on 01/09/2021 17:32:50 -------------------------------------------------------------------------------- Lower Extremity Assessment Details Patient Name: Date of Service: Autumn Massey, Autumn Massey NDA D. 01/09/2021 3:15 PM Medical Record Number: [id number] Patient Account Number: [id number] Date of Birth/Sex: Treating RN: 02-06-1966 (55 y.o. Tonita Phoenix, Lauren Primary Care [name] Kitchen: [name] Other Clinician: Referring Janica Eldred: Treating Nayla Dias/Extender: [name] in Treatment: 3 Edema Assessment Assessed: Shirlyn Goltz: No] Patrice Paradise: Yes] Edema: [Left: Ye] [Right: s] Calf Left: Right: Point of Measurement: From Medial Instep 39 cm Ankle Left: Right: Point of Measurement: From Medial Instep 23 cm Vascular Assessment Pulses: Dorsalis Pedis Palpable: [Right:Yes] Posterior Tibial Palpable:  [Right:Yes] Electronic Signature(s) Signed: 01/09/2021 6:12:45 PM By: Rhae Hammock RN Entered By: Rhae Hammock on 01/09/2021 15:51:12 -------------------------------------------------------------------------------- Multi Wound Chart Details Patient Name: Date of Service: Autumn Massey, Autumn Massey NDA D. 01/09/2021 3:15 PM Medical Record Number: [id number] Patient Account Number: [id number] Date of Birth/Sex: Treating RN: 1965/07/16 (55 y.o. [name] Primary Care Exie Chrismer: [name] Other Clinician: Referring Edyth Glomb: Treating Maghan Jessee/Extender: [name] in Treatment: 3 Vital Signs Height(in): 62 Capillary Blood Glucose(mg/dl): 130 Weight(lbs): 319 Pulse(bpm): 74 Body Mass Index(BMI): 63 Blood Pressure(mmHg): 101/66 Temperature(F): 97.7 Respiratory Rate(breaths/min): 17 Photos: [2:No Photos Right, Lateral, Posterior Lower Leg N/A] [N/A:N/A] Wound Location: [2:Trauma] [  N/A:N/A] Wounding Event: [2:Diabetic Wound/Ulcer of the Lower] [N/A:N/A] Primary Etiology: [2:Extremity Venous Leg Ulcer] [N/A:N/A] Secondary Etiology: [2:Asthma, Congestive Heart Failure,] [N/A:N/A] Comorbid History: [2:Hypertension, Type II Diabetes, Osteoarthritis, Neuropathy 08/09/2020] [N/A:N/A] Date Acquired: [2:3] [N/A:N/A] Weeks of Treatment: [2:Open] [N/A:N/A] Wound Status: [2:0.1x0.1x0.1] [N/A:N/A] Measurements L x W x D (cm) [2:0.008] [N/A:N/A] A (cm) : rea [2:0.001] [N/A:N/A] Volume (cm) : [2:99.90%] [N/A:N/A] % Reduction in A rea: [2:99.90%] [N/A:N/A] % Reduction in Volume: [2:Grade 1] [N/A:N/A] Classification: [2:Small] [N/A:N/A] Exudate A mount: [2:Serosanguineous] [N/A:N/A] Exudate Type: [2:red, brown] [N/A:N/A] Exudate Color: [2:Flat and Intact] [N/A:N/A] Wound Margin: [2:Large (67-100%)] [N/A:N/A] Granulation A mount: [2:Red, Pink] [N/A:N/A] Granulation Quality: [2:None Present (0%)] [N/A:N/A] Necrotic A mount: [2:Fat Layer (Subcutaneous Tissue): Yes  N/A] Exposed Structures: [2:Fascia: No Tendon: No Muscle: No Joint: No Bone: No Large (67-100%)] [N/A:N/A] Epithelialization: [2:Compression Therapy] [N/A:N/A] Treatment Notes Electronic Signature(s) Signed: 01/09/2021 5:03:28 PM By: Linton Ham MD Signed: 01/10/2021 6:01:10 PM By: Levan Hurst RN, BSN Entered By: Linton Ham on 01/09/2021 16:32:12 -------------------------------------------------------------------------------- [place] Details Patient Name: Date of Service: Autumn Massey, [name] 01/09/2021 3:15 PM Medical Record Number: [id number] Patient Account Number: [id number] Date of Birth/Sex: Treating RN: 01/16/1966 (55 y.o. [name] Primary Care Damyn Weitzel: [name] Other Clinician: Referring [employer] [facility]: Treating Sway Guttierrez/Extender: [name] in Treatment: 3 Active Inactive Orientation to the Wound Care Program Nursing Diagnoses: Knowledge deficit related to the wound healing center program Goals: Patient/caregiver will verbalize understanding of the [place] Date Initiated: 12/14/2020 Target Resolution Date: 01/11/2021 Goal Status: Active Interventions: Provide education on orientation to the wound center Notes: Venous Leg Ulcer Nursing Diagnoses: Actual venous Insuffiency (use after diagnosis is confirmed) Goals: Patient will maintain optimal edema control Date Initiated: 12/14/2020 Target Resolution Date: 01/11/2021 Goal Status: Active Interventions: Assess peripheral edema status every visit. Compression as ordered Provide education on venous insufficiency Treatment Activities: Therapeutic compression applied : 12/14/2020 Notes: Wound/Skin Impairment Nursing Diagnoses: Impaired tissue integrity Goals: Patient/caregiver will verbalize understanding of skin care regimen Date Initiated: 12/14/2020 Target Resolution Date: 01/11/2021 Goal Status: Active Ulcer/skin breakdown will have  a volume reduction of 30% by week 4 Date Initiated: 12/14/2020 Target Resolution Date: 01/11/2021 Goal Status: Active Interventions: Assess patient/caregiver ability to obtain necessary supplies Assess patient/caregiver ability to perform ulcer/skin care regimen upon admission and as needed Assess ulceration(s) every visit Provide education on ulcer and skin care Treatment Activities: Skin care regimen initiated : 12/14/2020 Topical wound management initiated : 12/14/2020 Notes: Electronic Signature(s) Signed: 01/10/2021 6:01:10 PM By: Levan Hurst RN, BSN Entered By: Levan Hurst on 01/09/2021 18:06:13 -------------------------------------------------------------------------------- Pain Assessment Details Patient Name: Date of Service: Autumn Massey, Autumn Massey NDA D. 01/09/2021 3:15 PM Medical Record Number: [id number] Patient Account Number: [id number] Date of Birth/Sex: Treating RN: 1965-10-20 (56 y.o. Tonita Phoenix, Lauren Primary Care Morry Veiga: [name] Other Clinician: Referring Dvante Hands: Treating Evelena Masci/Extender: [name] in Treatment: 3 Active Problems Location of Pain Severity and Description of Pain Patient Has Paino No Site Locations Pain Management and Medication Current Pain Management: Electronic Signature(s) Signed: 01/09/2021 6:12:45 PM By: Rhae Hammock RN Entered By: Rhae Hammock on 01/09/2021 15:46:12 -------------------------------------------------------------------------------- Patient/Caregiver Education Details Patient Name: Date of Service: Autumn Massey NDA D. 7/13/2022andnbsp3:15 PM Medical Record Number: [id number] Patient Account Number: [id number] Date of Birth/Gender: Treating RN: [birth year] (55 y.o. [name] Primary Care Physician: [name] Other Clinician: Referring Physician: Treating Physician/Extender: [name] in Treatment: 3 Education Assessment Education Provided  To: Patient Education  Topics Provided Wound/Skin Impairment: Methods: Explain/Verbal Responses: State content correctly Electronic Signature(s) Signed: 01/10/2021 6:01:10 PM By: Levan Hurst RN, BSN Entered By: Levan Hurst on 01/09/2021 18:06:29 -------------------------------------------------------------------------------- Wound Assessment Details Patient Name: Date of Service: Autumn Massey, Autumn Massey NDA D. 01/09/2021 3:15 PM Medical Record Number: [id number] Patient Account Number: [id number] Date of Birth/Sex: Treating RN: 06-28-1966 (55 y.o. [name] Primary Care Hailyn Zarr: [name] Other Clinician: Referring Siyon Linck: Treating Kylani Wires/Extender: [name] in Treatment: 3 Wound Status Wound Number: 2 Primary Diabetic Wound/Ulcer of the Lower Extremity Etiology: Wound Location: Right, Lateral, Posterior Lower Leg Secondary Venous Leg Ulcer Wounding Event: Trauma Etiology: Date Acquired: 08/09/2020 Wound Open Weeks Of Treatment: 3 Status: Clustered Wound: No Comorbid Asthma, Congestive Heart Failure, Hypertension, Type II History: Diabetes, Osteoarthritis, Neuropathy Wound Measurements Length: (cm) 0.1 Width: (cm) 0.1 Depth: (cm) 0.1 Area: (cm) 0.008 Volume: (cm) 0.001 % Reduction in Area: 99.9% % Reduction in Volume: 99.9% Epithelialization: Large (67-100%) Tunneling: No Undermining: No Wound Description Classification: Grade 1 Wound Margin: Flat and Intact Exudate Amount: Small Exudate Type: Serosanguineous Exudate Color: red, brown Foul Odor After Cleansing: No Slough/Fibrino Yes Wound Bed Granulation Amount: Large (67-100%) Exposed Structure Granulation Quality: Red, Pink Fascia Exposed: No Necrotic Amount: None Present (0%) Fat Layer (Subcutaneous Tissue) Exposed: Yes Tendon Exposed: No Muscle Exposed: No Joint Exposed: No Bone Exposed: No Treatment Notes Wound #2 (Lower Leg) Wound Laterality: Right, Lateral,  Posterior Cleanser Soap and Water Discharge Instruction: May shower and wash wound with dial antibacterial soap and water prior to dressing change. Wound Cleanser Discharge Instruction: Cleanse the wound with wound cleanser prior to applying a clean dressing using gauze sponges, not tissue or cotton balls. Peri-Wound Care Triamcinolone 15 (g) Discharge Instruction: Apply liberally to red areas on leg Sween Lotion (Moisturizing lotion) Discharge Instruction: Apply moisturizing lotion as directed Topical Primary Dressing Maxorb Extra Calcium Alginate 2x2 in Discharge Instruction: Apply calcium alginate to wound bed as instructed Secondary Dressing Woven Gauze Sponge, Non-Sterile 4x4 in Discharge Instruction: Apply over primary dressing as directed. Secured With Compression Wrap ThreePress (3 layer compression wrap) Discharge Instruction: Apply three layer compression as directed. Compression Stockings Add-Ons Electronic Signature(s) Signed: 01/10/2021 6:01:10 PM By: Levan Hurst RN, BSN Entered By: Levan Hurst on 01/09/2021 16:07:52 -------------------------------------------------------------------------------- [place] Details Patient Name: Date of Service: Autumn Massey, Autumn Massey NDA D. 01/09/2021 3:15 PM Medical Record Number: [id number] Patient Account Number: [id number] Date of Birth/Sex: Treating RN: [birth year] (55 y.o. Tonita Phoenix, Lauren Primary Care Oyinkansola Truax: [name] Other Clinician: Referring Marquiz Sotelo: Treating Decarlos Empey/Extender: [name] in Treatment: 3 Vital Signs Time Taken: 15:41 Temperature (F): 97.7 Height (in): 62 Pulse (bpm): 74 Weight (lbs): 319 Respiratory Rate (breaths/min): 17 Body Mass Index (BMI): 58.3 Blood Pressure (mmHg): 101/66 Capillary Blood Glucose (mg/dl): 130 Reference Range: 80 - 120 mg / dl Electronic Signature(s) Signed: 01/09/2021 6:12:45 PM By: Rhae Hammock RN Entered By: Rhae Hammock on  01/09/2021 15:46:04

## 2021-01-10 NOTE — Progress Notes (Signed)
Autumn Massey, Autumn Massey ([id number]) Visit Report for 01/09/2021 HPI Details Patient Name: Date of Service: [place], [name] 01/09/2021 3:15 PM Medical Record Number: [id number] Patient Account Number: [id number] Date of Birth/Sex: Treating RN: 05-08-1966 (55 y.o. Autumn Massey Primary Care Provider: [name] Other Clinician: Referring Provider: Treating Provider/Extender: [name] in Treatment: 3 History of Present Illness HPI Description: ADMISSION 12/14/2020 This is a pleasant 55 year old woman who is here for our review of wound on the right lateral calf. She is a type II diabetic. She said this started when she traumatized her leg hitting it on the bed frame in February. This was painful. She was left with open wounds. She initially used topical antibiotics but found this was not happening more recently she has been using a combination of diluted hydrogen peroxide, and antibiotic pads. She has support hose but does not wear them. The patient complains of a lot of pain sometimes waking her up tonight at night with a sharp jabbing discomfort. She has neuropathy but this may not be secondary to her diabetes rather idiopathic peripheral neuropathy is also listed on her problem list. Past medical history includes type 2 diabetes not on insulin last hemoglobin A1c of 8, COVID in February 2022 with chronic shortness of breath. At 1 point she was listed as having COPD although she thinks this is more asthma, hypothyroidism, hyperlipidemia, nighttime oxygen, essential tremor hearing loss and hypertension. She had a lower extremity DVT study on 10/03/2020 and although it was a limited study because of pannus folds over her upper thighs there was no specific evidence of DVT on the right side. ABI in our clinic on the right was 0.94 6/24; patient be admitted to the clinic last week. She had 2 areas on her right lateral lower leg. Substantial amount of erythema around both of  these wounds which could have been cellulitis or contact dermatitis. I favor the latter but covered her with antibiotics in any case. She arrives in clinic today things look a lot better especially her skin. Wounds are smaller. We use silver alginate 6/29; the patient is down to 1 area on the right lateral lower leg. The erythema around this is totally resolved this was likely a contact dermatitis. I been using silver alginate 7/6; wound on the right lateral lower leg is much smaller. No erythema. Her edema control is excellent 7/13; wound on the right lower leg has epithelialized but still vulnerable especially at the superior aspect. We have been using Sorbact although I do not think Sorbact is going to be necessary here. She tells that she gets her disability next Wednesday. Will not be able to order her stockings from [facility] until then. Electronic Signature(s) Signed: 01/09/2021 5:03:28 PM By: Linton Ham MD Entered By: Linton Ham on 01/09/2021 16:33:43 -------------------------------------------------------------------------------- Physical Exam Details Patient Name: Date of Service: Autumn Massey, [name] 01/09/2021 3:15 PM Medical Record Number: [id number] Patient Account Number: [id number] Date of Birth/Sex: Treating RN: Jun 26, 1966 (55 y.o. Autumn Massey Primary Care Provider: [name] Other Clinician: Referring Provider: Treating Provider/Extender: [name] in Treatment: 3 Constitutional Sitting or standing Blood Pressure is within target range for patient.. Pulse regular and within target range for patient.[name] Respirations regular, non-labored and within target range.. Temperature is normal and within the target range for the patient.[name] Appears in no distress. Cardiovascular Pedal pulses are palpable. Notes Wound exam; right lateral calf. This area is totally epithelialized however still vulnerable at its  superior aspect there is no  tenderness. Edema control is excellent. Electronic Signature(s) Signed: 01/09/2021 5:03:28 PM By: Linton Ham MD Entered By: Linton Ham on 01/09/2021 16:34:59 -------------------------------------------------------------------------------- Physician Orders Details Patient Name: Date of Service: Autumn Massey, [name] 01/09/2021 3:15 PM Medical Record Number: [id number] Patient Account Number: [id number] Date of Birth/Sex: Treating RN: June 06, 1966 (55 y.o. Autumn Massey Primary Care Provider: [name] Other Clinician: Referring Provider: Treating Provider/Extender: [name] in Treatment: 3 Verbal / Phone Orders: No Diagnosis Coding ICD-10 Coding Code Description [phone] Chronic venous hypertension (idiopathic) with inflammation of right lower extremity L97.818 Non-pressure chronic ulcer of other part of right lower leg with other specified severity L03.115 Cellulitis of right lower limb L24.5 Irritant contact dermatitis due to other chemical products Follow-up Appointments ppointment in 1 week. - with Dr. Dellia Nims Return A Bathing/ Shower/ Hygiene May shower with protection but do not get wound dressing(s) wet. Edema Control - Lymphedema / SCD / Other Elevate legs to the level of the heart or above for 30 minutes daily and/or when sitting, a frequency of: - throughout the day Avoid standing for long periods of time. Wound Treatment Wound #2 - Lower Leg Wound Laterality: Right, Lateral, Posterior Cleanser: Soap and Water 1 x Per Week/30 Days Discharge Instructions: May shower and wash wound with dial antibacterial soap and water prior to dressing change. Cleanser: Wound Cleanser 1 x Per Week/30 Days Discharge Instructions: Cleanse the wound with wound cleanser prior to applying a clean dressing using gauze sponges, not tissue or cotton balls. Peri-Wound Care: Triamcinolone 15 (g) 1 x Per Week/30 Days Discharge Instructions: Apply liberally to red  areas on leg Peri-Wound Care: Sween Lotion (Moisturizing lotion) 1 x Per Week/30 Days Discharge Instructions: Apply moisturizing lotion as directed Prim Dressing: Maxorb Extra Calcium Alginate 2x2 in 1 x Per Week/30 Days ary Discharge Instructions: Apply calcium alginate to wound bed as instructed Secondary Dressing: Woven Gauze Sponge, Non-Sterile 4x4 in 1 x Per Week/30 Days Discharge Instructions: Apply over primary dressing as directed. Compression Wrap: ThreePress (3 layer compression wrap) 1 x Per Week/30 Days Discharge Instructions: Apply three layer compression as directed. Electronic Signature(s) Signed: 01/09/2021 5:03:28 PM By: Linton Ham MD Signed: 01/10/2021 6:01:10 PM By: Levan Hurst RN, BSN Entered By: Levan Hurst on 01/09/2021 16:08:32 -------------------------------------------------------------------------------- Problem List Details Patient Name: Date of Service: Autumn Massey, [name] 01/09/2021 3:15 PM Medical Record Number: [id number] Patient Account Number: [id number] Date of Birth/Sex: Treating RN: 1966/02/02 (55 y.o. Autumn Massey Primary Care Provider: [name] Other Clinician: Referring Provider: Treating Provider/Extender: [name] in Treatment: 3 Active Problems ICD-10 Encounter Code Description Active Date MDM Diagnosis I87.321 Chronic venous hypertension (idiopathic) with inflammation of right lower 12/14/2020 No Yes extremity L97.818 Non-pressure chronic ulcer of other part of right lower leg with other specified 12/14/2020 No Yes severity L03.115 Cellulitis of right lower limb 12/14/2020 No Yes L24.5 Irritant contact dermatitis due to other chemical products 12/14/2020 No Yes Inactive Problems Resolved Problems Electronic Signature(s) Signed: 01/09/2021 5:03:28 PM By: Linton Ham MD Entered By: Linton Ham on 01/09/2021  16:32:04 -------------------------------------------------------------------------------- Progress Note Details Patient Name: Date of Service: Autumn Massey, [name] 01/09/2021 3:15 PM Medical Record Number: [id number] Patient Account Number: [id number] Date of Birth/Sex: Treating RN: December 01, 1965 (55 y.o. Autumn Massey Primary Care Provider: [name] Other Clinician: Referring Provider: Treating Provider/Extender: [name] in Treatment: 3 Subjective History of Present Illness (HPI) ADMISSION 12/14/2020 This is a  pleasant 55 year old woman who is here for our review of wound on the right lateral calf. She is a type II diabetic. She said this started when she traumatized her leg hitting it on the bed frame in February. This was painful. She was left with open wounds. She initially used topical antibiotics but found this was not happening more recently she has been using a combination of diluted hydrogen peroxide, and antibiotic pads. She has support hose but does not wear them. The patient complains of a lot of pain sometimes waking her up tonight at night with a sharp jabbing discomfort. She has neuropathy but this may not be secondary to her diabetes rather idiopathic peripheral neuropathy is also listed on her problem list. Past medical history includes type 2 diabetes not on insulin last hemoglobin A1c of 8, COVID in February 2022 with chronic shortness of breath. At 1 point she was listed as having COPD although she thinks this is more asthma, hypothyroidism, hyperlipidemia, nighttime oxygen, essential tremor hearing loss and hypertension. She had a lower extremity DVT study on 10/03/2020 and although it was a limited study because of pannus folds over her upper thighs there was no specific evidence of DVT on the right side. ABI in our clinic on the right was 0.94 6/24; patient be admitted to the clinic last week. She had 2 areas on her right lateral lower leg.  Substantial amount of erythema around both of these wounds which could have been cellulitis or contact dermatitis. I favor the latter but covered her with antibiotics in any case. She arrives in clinic today things look a lot better especially her skin. Wounds are smaller. We use silver alginate 6/29; the patient is down to 1 area on the right lateral lower leg. The erythema around this is totally resolved this was likely a contact dermatitis. I been using silver alginate 7/6; wound on the right lateral lower leg is much smaller. No erythema. Her edema control is excellent 7/13; wound on the right lower leg has epithelialized but still vulnerable especially at the superior aspect. We have been using Sorbact although I do not think Sorbact is going to be necessary here. She tells that she gets her disability next Wednesday. Will not be able to order her stockings from [facility] until then. Objective Constitutional Sitting or standing Blood Pressure is within target range for patient.. Pulse regular and within target range for patient.[name] Respirations regular, non-labored and within target range.. Temperature is normal and within the target range for the patient.[name] Appears in no distress. Vitals Time Taken: 3:41 PM, Height: 62 in, Weight: 319 lbs, BMI: 58.3, Temperature: 97.7 F, Pulse: 74 bpm, Respiratory Rate: 17 breaths/min, Blood Pressure: 101/66 mmHg, Capillary Blood Glucose: 130 mg/dl. Cardiovascular Pedal pulses are palpable. General Notes: Wound exam; right lateral calf. This area is totally epithelialized however still vulnerable at its superior aspect there is no tenderness. Edema control is excellent. Integumentary (Hair, Skin) Wound #2 status is Open. Original cause of wound was Trauma. The date acquired was: 08/09/2020. The wound has been in treatment 3 weeks. The wound is located on the Right,Lateral,Posterior Lower Leg. The wound measures 0.1cm length x 0.1cm width x 0.1cm depth;  0.008cm^2 area and 0.001cm^3 volume. There is Fat Layer (Subcutaneous Tissue) exposed. There is no tunneling or undermining noted. There is a small amount of serosanguineous drainage noted. The wound margin is flat and intact. There is large (67-100%) red, pink granulation within the wound bed. There is no necrotic  tissue within the wound bed. Assessment Active Problems ICD-10 Chronic venous hypertension (idiopathic) with inflammation of right lower extremity Non-pressure chronic ulcer of other part of right lower leg with other specified severity Cellulitis of right lower limb Irritant contact dermatitis due to other chemical products Procedures Wound #2 Pre-procedure diagnosis of Wound #2 is a Diabetic Wound/Ulcer of the Lower Extremity located on the Right,Lateral,Posterior Lower Leg . There was a Three Layer Compression Therapy Procedure by Levan Hurst, RN. Post procedure Diagnosis Wound #2: Same as Pre-Procedure Plan Follow-up Appointments: Return Appointment in 1 week. - with Dr. Arcola Jansky Shower/ Hygiene: May shower with protection but do not get wound dressing(s) wet. Edema Control - Lymphedema / SCD / Other: Elevate legs to the level of the heart or above for 30 minutes daily and/or when sitting, a frequency of: - throughout the day Avoid standing for long periods of time. WOUND #2: - Lower Leg Wound Laterality: Right, Lateral, Posterior Cleanser: Soap and Water 1 x Per Week/30 Days Discharge Instructions: May shower and wash wound with dial antibacterial soap and water prior to dressing change. Cleanser: Wound Cleanser 1 x Per Week/30 Days Discharge Instructions: Cleanse the wound with wound cleanser prior to applying a clean dressing using gauze sponges, not tissue or cotton balls. Peri-Wound Care: Triamcinolone 15 (g) 1 x Per Week/30 Days Discharge Instructions: Apply liberally to red areas on leg Peri-Wound Care: Sween Lotion (Moisturizing lotion) 1 x Per Week/30  Days Discharge Instructions: Apply moisturizing lotion as directed Prim Dressing: Maxorb Extra Calcium Alginate 2x2 in 1 x Per Week/30 Days ary Discharge Instructions: Apply calcium alginate to wound bed as instructed Secondary Dressing: Woven Gauze Sponge, Non-Sterile 4x4 in 1 x Per Week/30 Days Discharge Instructions: Apply over primary dressing as directed. Com pression Wrap: ThreePress (3 layer compression wrap) 1 x Per Week/30 Days Discharge Instructions: Apply three layer compression as directed. #1 I am going to put calcium alginate on this area and still put her in compression for another week 2. She is to order her stockings from [facility] next Wednesday morning. We will apply her old stockings when she comes in for her appointment. She will be dischargeable at that point. Electronic Signature(s) Signed: 01/09/2021 5:03:28 PM By: Linton Ham MD Entered By: Linton Ham on 01/09/2021 16:36:24 -------------------------------------------------------------------------------- SuperBill Details Patient Name: Date of Service: Autumn Massey, [name] 01/09/2021 Medical Record Number: [id number] Patient Account Number: [id number] Date of Birth/Sex: Treating RN: 1965-11-18 (55 y.o. Autumn Massey Primary Care Provider: [name] Other Clinician: Referring Provider: Treating Provider/Extender: [name] in Treatment: 3 Diagnosis Coding ICD-10 Codes Code Description [phone] Chronic venous hypertension (idiopathic) with inflammation of right lower extremity L97.818 Non-pressure chronic ulcer of other part of right lower leg with other specified severity L03.115 Cellulitis of right lower limb L24.5 Irritant contact dermatitis due to other chemical products Facility Procedures CPT4 Code: 59935701 Description: (Facility Use Only) [id number] - [place] [facility] RT LEG Modifier: Quantity: 1 Physician Procedures : CPT4 Code Description Modifier  [id number] - WC PHYS LEVEL 3 - EST PT ICD-10 Diagnosis Description I87.321 Chronic venous hypertension (idiopathic) with inflammation of right lower extremity L97.818 Non-pressure chronic ulcer of other part of right  lower leg with other specified severity Quantity: 1 Electronic Signature(s) Signed: 01/10/2021 4:10:45 PM By: Linton Ham MD Signed: 01/10/2021 6:01:10 PM By: Levan Hurst RN, BSN Previous Signature: 01/09/2021 5:03:28 PM Version By: Linton Ham MD Entered By: Levan Hurst on 01/09/2021 18:06:55

## 2021-01-16 ENCOUNTER — Other Ambulatory Visit: Payer: Self-pay | Admitting: Family Medicine

## 2021-01-17 ENCOUNTER — Other Ambulatory Visit: Payer: Self-pay

## 2021-01-17 ENCOUNTER — Encounter (HOSPITAL_BASED_OUTPATIENT_CLINIC_OR_DEPARTMENT_OTHER): Payer: Medicare Other | Admitting: Internal Medicine

## 2021-01-17 DIAGNOSIS — L97218 Non-pressure chronic ulcer of right calf with other specified severity: Secondary | ICD-10-CM | POA: Diagnosis not present

## 2021-01-17 DIAGNOSIS — I872 Venous insufficiency (chronic) (peripheral): Secondary | ICD-10-CM | POA: Diagnosis not present

## 2021-01-17 NOTE — Progress Notes (Signed)
Autumn Massey, Autumn Massey ([id number]) Visit Report for 01/17/2021 Arrival Information Details Patient Name: Date of Service: [name], [name] 01/17/2021 2:30 PM Medical Record Number: [id number] Patient Account Number: [id number] Date of Birth/Sex: Treating RN: 07-09-1965 (55 y.o. Autumn Massey Primary Care Autumn Massey: Autumn Massey Other Clinician: Referring Autumn Massey: Treating Autumn Massey/Extender: Autumn Massey in Treatment: 4 Visit Information History Since Last Visit Added or deleted any medications: No Patient Arrived: Ambulatory Any new allergies or adverse reactions: No Arrival Time: 15:04 Had a fall or experienced change in No Accompanied By: self activities of daily living that may affect Transfer Assistance: None risk of falls: Patient Identification Verified: Yes Signs or symptoms of abuse/neglect since last visito No Secondary Verification Process Completed: Yes Hospitalized since last visit: No Patient Requires Transmission-Based Precautions: No Implantable device outside of the clinic excluding No Patient Has Alerts: No cellular tissue based products placed in the center since last visit: Has Dressing in Place as Prescribed: Yes Has Compression in Place as Prescribed: Yes Pain Present Now: No Electronic Signature(s) Signed: 01/17/2021 6:07:52 PM By: Autumn Gouty RN, BSN Entered By: Autumn Massey on 01/17/2021 15:09:18 -------------------------------------------------------------------------------- Encounter Discharge Information Details Patient Name: Date of Service: Autumn Massey, [name] 01/17/2021 2:30 PM Medical Record Number: [id number] Patient Account Number: [id number] Date of Birth/Sex: Treating RN: [birth year] (55 y.o. Autumn Massey Primary Care Autumn Massey Other Clinician: Referring Justinian Miano: Treating Marylynne Keelin/Extender: Autumn Massey in Treatment: 4 Encounter Discharge Information Items Discharge  Condition: Stable Ambulatory Status: Ambulatory Discharge Destination: Home Transportation: Private Auto Accompanied By: self Schedule Follow-up Appointment: No Clinical Summary of Care: Electronic Signature(s) Signed: 01/17/2021 6:31:47 PM By: Autumn Massey Entered By: Autumn Massey on 01/17/2021 15:33:48 -------------------------------------------------------------------------------- Lower Extremity Assessment Details Patient Name: Date of Service: Autumn Massey, [name] 01/17/2021 2:30 PM Medical Record Number: [id number] Patient Account Number: [id number] Date of Birth/Sex: Treating RN: 04-19-1966 (55 y.o. Autumn Massey Primary Care Nevan Massey: Autumn Massey Other Clinician: Referring Autumn Massey: Treating Arnetha Silverthorne/Extender: Autumn Massey in Treatment: 4 Edema Assessment Assessed: Autumn Massey: No] [Right: No] Edema: [Left: Ye] [Right: s] Calf Left: Right: Point of Measurement: From Medial Instep 40.3 cm Ankle Left: Right: Point of Measurement: From Medial Instep 23.5 cm Vascular Assessment Pulses: Dorsalis Pedis Palpable: [Right:Yes] Electronic Signature(s) Signed: 01/17/2021 6:07:52 PM By: Autumn Gouty RN, BSN Entered By: Autumn Massey on 01/17/2021 15:19:42 -------------------------------------------------------------------------------- Multi Wound Chart Details Patient Name: Date of Service: Autumn Massey, [name] 01/17/2021 2:30 PM Medical Record Number: [id number] Patient Account Number: [id number] Date of Birth/Sex: Treating RN: 1966/06/05 (55 y.o. Autumn Massey Primary Care Caetano Oberhaus: Autumn Massey Other Clinician: Referring Autumn Massey in Treatment: 4 Vital Signs Height(in): 110 Pulse(bpm): 58 Weight(lbs): 532 Blood Pressure(mmHg): 119/69 Body Mass Index(BMI): 58 Temperature(F): 98.8 Respiratory Rate(breaths/min): 20 Photos: [2:No Photos Right, Lateral, Posterior Lower Leg]  [N/A:N/A N/A] Wound Location: [2:Trauma] [N/A:N/A] Wounding Event: [2:Diabetic Wound/Ulcer of the Lower] [N/A:N/A] Primary Etiology: [2:Extremity Venous Leg Ulcer] [N/A:N/A] Secondary Etiology: [2:Asthma, Congestive Heart Failure,] [N/A:N/A] Comorbid History: [2:Hypertension, Type II Diabetes, Osteoarthritis, Neuropathy 08/09/2020] [N/A:N/A] Date Acquired: [2:4] [N/A:N/A] Weeks of Treatment: [2:Open] [N/A:N/A] Wound Status: [2:0x0x0] [N/A:N/A] Measurements L x W x D (cm) [2:0] [N/A:N/A] A (cm) : rea [2:0] [N/A:N/A] Volume (cm) : [2:100.00%] [N/A:N/A] % Reduction in A rea: [2:100.00%] [N/A:N/A] % Reduction in Volume: [2:Grade 1] [N/A:N/A] Classification: [2:None Present] [N/A:N/A] Exudate A mount: [2:None Present (0%)] [N/A:N/A] Granulation A mount: [2:None Present (0%)] [N/A:N/A] Necrotic A mount: [  2:Fascia: No] [N/A:N/A] Exposed Structures: [2:Fat Layer (Subcutaneous Tissue): No Tendon: No Muscle: No Joint: No Bone: No Large (67-100%)] [N/A:N/A] Treatment Notes Electronic Signature(s) Signed: 01/17/2021 5:24:41 PM By: Autumn Ham MD Signed: 01/17/2021 6:31:47 PM By: Autumn Massey Entered By: Autumn Massey on 01/17/2021 15:43:24 -------------------------------------------------------------------------------- Multi-Disciplinary Care Plan Details Patient Name: Date of Service: Autumn Massey, [name] 01/17/2021 2:30 PM Medical Record Number: [id number] Patient Account Number: [id number] Date of Birth/Sex: Treating RN: 11/02/1965 (55 y.o. Autumn Massey Primary Care Autumn Massey: Autumn Massey Other Clinician: Referring [name]: Treating Yaa Massey/Extender: Autumn Massey in Treatment: 4 Active Inactive Electronic Signature(s) Signed: 01/17/2021 6:31:47 PM By: Autumn Massey Entered By: Autumn Massey on 01/17/2021 15:29:49 -------------------------------------------------------------------------------- Pain Assessment Details Patient Name: Date of  Service: Autumn Massey, [name] 01/17/2021 2:30 PM Medical Record Number: [id number] Patient Account Number: [id number] Date of Birth/Sex: Treating RN: October 23, 1965 (55 y.o. Autumn Massey Primary Care Rolando Whitby: Autumn Massey Other Clinician: Referring Autumn Massey: Treating Autumn Massey/Extender: Autumn Massey in Treatment: 4 Active Problems Location of Pain Severity and Description of Pain Patient Has Paino No Site Locations Rate the pain. Rate the pain. Current Pain Level: 0 Pain Management and Medication Current Pain Management: Electronic Signature(s) Signed: 01/17/2021 6:07:52 PM By: Autumn Gouty RN, BSN Entered By: Autumn Massey on 01/17/2021 15:19:09 -------------------------------------------------------------------------------- Patient/Caregiver Education Details Patient Name: Date of Service: Autumn Massey NDA D. 7/21/2022andnbsp2:30 PM Medical Record Number: [id number] Patient Account Number: [id number] Date of Birth/Gender: Treating RN: 01-04-1966 (55 y.o. Autumn Massey Primary Care Physician: Autumn Massey Other Clinician: Referring Physician: Treating Physician/Extender: Autumn Massey in Treatment: 4 Education Assessment Education Provided To: Patient Education Topics Provided Wound/Skin Impairment: Handouts: Skin Care Do's and Dont's Methods: Explain/Verbal Responses: Reinforcements needed Electronic Signature(s) Signed: 01/17/2021 6:31:47 PM By: Autumn Massey Entered By: Autumn Massey on 01/17/2021 15:30:00 -------------------------------------------------------------------------------- Wound Assessment Details Patient Name: Date of Service: Autumn Massey, [name] 01/17/2021 2:30 PM Medical Record Number: [id number] Patient Account Number: [id number] Date of Birth/Sex: Treating RN: 15-Aug-1965 (55 y.o. Autumn Massey Primary Care Flecia Shutter: Autumn Massey Other Clinician: Referring Sherron Mummert: Treating Dustine Bertini/Extender:  Autumn Massey in Treatment: 4 Wound Status Wound Number: 2 Primary Diabetic Wound/Ulcer of the Lower Extremity Etiology: Wound Location: Right, Lateral, Posterior Lower Leg Secondary Venous Leg Ulcer Wounding Event: Trauma Etiology: Date Acquired: 08/09/2020 Wound Open Weeks Of Treatment: 4 Status: Clustered Wound: No Comorbid Asthma, Congestive Heart Failure, Hypertension, Type II History: Diabetes, Osteoarthritis, Neuropathy Wound Measurements Length: (cm) Width: (cm) Depth: (cm) Area: (cm) Volume: (cm) 0 % Reduction in Area: 100% 0 % Reduction in Volume: 100% 0 Epithelialization: Large (67-100%) 0 Tunneling: No 0 Undermining: No Wound Description Classification: Grade 1 Exudate Amount: None Present Foul Odor After Cleansing: No Slough/Fibrino No Wound Bed Granulation Amount: None Present (0%) Exposed Structure Necrotic Amount: None Present (0%) Fascia Exposed: No Fat Layer (Subcutaneous Tissue) Exposed: No Tendon Exposed: No Muscle Exposed: No Joint Exposed: No Bone Exposed: No Electronic Signature(s) Signed: 01/17/2021 6:07:52 PM By: Autumn Gouty RN, BSN Entered By: Autumn Massey on 01/17/2021 15:20:31 -------------------------------------------------------------------------------- [place] Details Patient Name: Date of Service: Autumn Massey, [name] 01/17/2021 2:30 PM Medical Record Number: [id number] Patient Account Number: [id number] Date of Birth/Sex: Treating RN: [birth year] (55 y.o. Autumn Massey Primary Care Kurstyn Larios: Autumn Massey Other Clinician: Referring Denzal Meir: Treating Glade Strausser/Extender: Autumn Massey in Treatment: 4 Vital Signs Time Taken: 15:09 Temperature (F): 98.8 Height (in): 62 Pulse (bpm): 82 Source: Stated Respiratory Rate (breaths/min):  20 Weight (lbs): 319 Blood Pressure (mmHg): 119/69 Source: Stated Reference Range: 80 - 120 mg / dl Body Mass Index (BMI): 58.3 Notes did not  check blood sugar today Electronic Signature(s) Signed: 01/17/2021 6:07:52 PM By: Autumn Gouty RN, BSN Entered By: Autumn Massey on 01/17/2021 15:10:28

## 2021-01-17 NOTE — Progress Notes (Signed)
Autumn Massey ([id number]) Visit Report for 01/17/2021 HPI Details Patient Name: Date of Service: Autumn Massey, [name] 01/17/2021 2:30 PM Medical Record Number: [id number] Patient Account Number: [id number] Date of Birth/Sex: Treating RN: 1965/12/16 (55 y.o. Autumn Massey Primary Care Provider: Cathlean Massey Other Clinician: Referring Provider: Treating Provider/Extender: [name] in Treatment: 4 History of Present Illness HPI Description: ADMISSION 12/14/2020 This is a pleasant 55 year old woman who is here for our review of wound on the right lateral calf. She is a type II diabetic. She said this started when she traumatized her leg hitting it on the bed frame in February. This was painful. She was left with open wounds. She initially used topical antibiotics but found this was not happening more recently she has been using a combination of diluted hydrogen peroxide, and antibiotic pads. She has support hose but does not wear them. The patient complains of a lot of pain sometimes waking her up tonight at night with a sharp jabbing discomfort. She has neuropathy but this may not be secondary to her diabetes rather idiopathic peripheral neuropathy is also listed on her problem list. Past medical history includes type 2 diabetes not on insulin last hemoglobin A1c of 8, COVID in February 2022 with chronic shortness of breath. At 1 point she was listed as having COPD although she thinks this is more asthma, hypothyroidism, hyperlipidemia, nighttime oxygen, essential tremor hearing loss and hypertension. She had a lower extremity DVT study on 10/03/2020 and although it was a limited study because of pannus folds over her upper thighs there was no specific evidence of DVT on the right side. ABI in our clinic on the right was 0.94 6/24; patient be admitted to the clinic last week. She had 2 areas on her right lateral lower leg. Substantial amount of erythema around both of  these wounds which could have been cellulitis or contact dermatitis. I favor the latter but covered her with antibiotics in any case. She arrives in clinic today things look a lot better especially her skin. Wounds are smaller. We use silver alginate 6/29; the patient is down to 1 area on the right lateral lower leg. The erythema around this is totally resolved this was likely a contact dermatitis. I been using silver alginate 7/6; wound on the right lateral lower leg is much smaller. No erythema. Her edema control is excellent 7/13; wound on the right lower leg has epithelialized but still vulnerable especially at the superior aspect. We have been using Sorbact although I do not think Sorbact is going to be necessary here. She tells that she gets her disability next Wednesday. Will not be able to order her stockings from [employer] until then. 7/21; the area on the right posterior calf is fully epithelialized. She says she is played "phone tag" with elastic therapy she does not have new compression stockings she brings in a pair of stockings that are probably support hose and I am not really sure how old these are. Electronic Signature(s) Signed: 01/17/2021 5:24:41 PM By: Linton Ham MD Entered By: Linton Ham on 01/17/2021 15:44:01 -------------------------------------------------------------------------------- Physical Exam Details Patient Name: Date of Service: Autumn Massey, [name] 01/17/2021 2:30 PM Medical Record Number: [id number] Patient Account Number: [id number] Date of Birth/Sex: Treating RN: 1966-04-08 (55 y.o. Autumn Massey Primary Care Provider: Cathlean Massey Other Clinician: Referring Provider: Treating Provider/Extender: [name] in Treatment: 4 Constitutional Sitting or standing Blood Pressure is within target range for patient.. Pulse  regular and within target range for patient.[name] Respirations regular, non-labored and within target  range.. Temperature is normal and within the target range for the patient.[name] Appears in no distress. Notes Wound exam; right posterior lateral calf these are totally epithelialized. Edema control is excellent under our compression. Electronic Signature(s) Signed: 01/17/2021 5:24:41 PM By: Linton Ham MD Entered By: Linton Ham on 01/17/2021 15:44:55 -------------------------------------------------------------------------------- Physician Orders Details Patient Name: Date of Service: Autumn Massey, [name] 01/17/2021 2:30 PM Medical Record Number: [id number] Patient Account Number: [id number] Date of Birth/Sex: Treating RN: 06/06/1966 (55 y.o. Autumn Massey Primary Care Provider: Cathlean Massey Other Clinician: Referring Provider: Treating Provider/Extender: [name] in Treatment: 4 Verbal / Phone Orders: No Diagnosis Coding ICD-10 Coding Code Description [phone] Chronic venous hypertension (idiopathic) with inflammation of right lower extremity L97.818 Non-pressure chronic ulcer of other part of right lower leg with other specified severity L03.115 Cellulitis of right lower limb L24.5 Irritant contact dermatitis due to other chemical products Discharge From [facility] Services Discharge from [place] - Call if any future wound care needs. Edema Control - Lymphedema / SCD / Other Elevate legs to the level of the heart or above for 30 minutes daily and/or when sitting, a frequency of: - throughout the day. Avoid standing for long periods of time. Exercise regularly Moisturize legs daily. - every night before bed. Compression stocking or Garment 20-30 mm/Hg pressure to: - apply in the morning and remove at night. Electronic Signature(s) Signed: 01/17/2021 5:24:41 PM By: Linton Ham MD Signed: 01/17/2021 6:31:47 PM By: Deon Pilling Entered By: Deon Pilling on 01/17/2021  15:31:00 -------------------------------------------------------------------------------- Problem List Details Patient Name: Date of Service: Autumn Massey, [name] 01/17/2021 2:30 PM Medical Record Number: [id number] Patient Account Number: [id number] Date of Birth/Sex: Treating RN: Jan 10, 1966 (55 y.o. Autumn Massey Massey, Autumn Massey Primary Care Provider: Cathlean Massey Other Clinician: Referring Provider: Treating Provider/Extender: [name] in Treatment: 4 Active Problems ICD-10 Encounter Code Description Active Date MDM Diagnosis I87.321 Chronic venous hypertension (idiopathic) with inflammation of right lower 12/14/2020 No Yes extremity L97.818 Non-pressure chronic ulcer of other part of right lower leg with other specified 12/14/2020 No Yes severity L03.115 Cellulitis of right lower limb 12/14/2020 No Yes L24.5 Irritant contact dermatitis due to other chemical products 12/14/2020 No Yes Inactive Problems Resolved Problems Electronic Signature(s) Signed: 01/17/2021 5:24:41 PM By: Linton Ham MD Entered By: Linton Ham on 01/17/2021 15:43:17 -------------------------------------------------------------------------------- Progress Note Details Patient Name: Date of Service: Autumn Massey, [name] 01/17/2021 2:30 PM Medical Record Number: [id number] Patient Account Number: [id number] Date of Birth/Sex: Treating RN: 08-Feb-1966 (55 y.o. Autumn Massey Primary Care Provider: Cathlean Massey Other Clinician: Referring Provider: Treating Provider/Extender: [name] in Treatment: 4 Subjective History of Present Illness (HPI) ADMISSION 12/14/2020 This is a pleasant 55 year old woman who is here for our review of wound on the right lateral calf. She is a type II diabetic. She said this started when she traumatized her leg hitting it on the bed frame in February. This was painful. She was left with open wounds. She initially used topical antibiotics but  found this was not happening more recently she has been using a combination of diluted hydrogen peroxide, and antibiotic pads. She has support hose but does not wear them. The patient complains of a lot of pain sometimes waking her up tonight at night with a sharp jabbing discomfort. She has neuropathy but this may not be secondary to her diabetes rather idiopathic  peripheral neuropathy is also listed on her problem list. Past medical history includes type 2 diabetes not on insulin last hemoglobin A1c of 8, COVID in February 2022 with chronic shortness of breath. At 1 point she was listed as having COPD although she thinks this is more asthma, hypothyroidism, hyperlipidemia, nighttime oxygen, essential tremor hearing loss and hypertension. She had a lower extremity DVT study on 10/03/2020 and although it was a limited study because of pannus folds over her upper thighs there was no specific evidence of DVT on the right side. ABI in our clinic on the right was 0.94 6/24; patient be admitted to the clinic last week. She had 2 areas on her right lateral lower leg. Substantial amount of erythema around both of these wounds which could have been cellulitis or contact dermatitis. I favor the latter but covered her with antibiotics in any case. She arrives in clinic today things look a lot better especially her skin. Wounds are smaller. We use silver alginate 6/29; the patient is down to 1 area on the right lateral lower leg. The erythema around this is totally resolved this was likely a contact dermatitis. I been using silver alginate 7/6; wound on the right lateral lower leg is much smaller. No erythema. Her edema control is excellent 7/13; wound on the right lower leg has epithelialized but still vulnerable especially at the superior aspect. We have been using Sorbact although I do not think Sorbact is going to be necessary here. She tells that she gets her disability next Wednesday. Will not be able to  order her stockings from [employer] until then. 7/21; the area on the right posterior calf is fully epithelialized. She says she is played "phone tag" with elastic therapy she does not have new compression stockings she brings in a pair of stockings that are probably support hose and I am not really sure how old these are. Objective Constitutional Sitting or standing Blood Pressure is within target range for patient.. Pulse regular and within target range for patient.[name] Respirations regular, non-labored and within target range.. Temperature is normal and within the target range for the patient.[name] Appears in no distress. Vitals Time Taken: 3:09 PM, Height: 62 in, Source: Stated, Weight: 319 lbs, Source: Stated, BMI: 58.3, Temperature: 98.8 F, Pulse: 82 bpm, Respiratory Rate: 20 breaths/min, Blood Pressure: 119/69 mmHg. General Notes: did not check blood sugar today General Notes: Wound exam; right posterior lateral calf these are totally epithelialized. Edema control is excellent under our compression. Integumentary (Hair, Skin) Wound #2 status is Open. Original cause of wound was Trauma. The date acquired was: 08/09/2020. The wound has been in treatment 4 weeks. The wound is located on the Right,Lateral,Posterior Lower Leg. The wound measures 0cm length x 0cm width x 0cm depth; 0cm^2 area and 0cm^3 volume. There is no tunneling or undermining noted. There is a none present amount of drainage noted. There is no granulation within the wound bed. There is no necrotic tissue within the wound bed. Assessment Active Problems ICD-10 Chronic venous hypertension (idiopathic) with inflammation of right lower extremity Non-pressure chronic ulcer of other part of right lower leg with other specified severity Cellulitis of right lower limb Irritant contact dermatitis due to other chemical products Plan Discharge From [facility] Services: Discharge from [facility] - Call if any future wound care  needs. Edema Control - Lymphedema / SCD / Other: Elevate legs to the level of the heart or above for 30 minutes daily and/or when sitting, a  frequency of: - throughout the day. Avoid standing for long periods of time. Exercise regularly Moisturize legs daily. - every night before bed. Compression stocking or Garment 20-30 mm/Hg pressure to: - apply in the morning and remove at night. 1. The patient has chronic severe venous insufficiency. This wound happened from trauma on the bed frame. It is now closed 2. We did put her support stocking back on I am not sure that this will be sufficient and I told her this. For 3 she can be discharged from the clinic Electronic Signature(s) Signed: 01/17/2021 5:24:41 PM By: Linton Ham MD Entered By: Linton Ham on 01/17/2021 15:45:52 -------------------------------------------------------------------------------- [place] Details Patient Name: Date of Service: Autumn Massey, [name] 01/17/2021 Medical Record Number: [id number] Patient Account Number: [id number] Date of Birth/Sex: Treating RN: 02-18-1966 (55 y.o. Autumn Massey, [place] Primary Care Provider: Cathlean Massey Other Clinician: Referring Provider: Treating Provider/Extender: [name] in Treatment: 4 Diagnosis Coding ICD-10 Codes Code Description [phone] Chronic venous hypertension (idiopathic) with inflammation of right lower extremity L97.818 Non-pressure chronic ulcer of other part of right lower leg with other specified severity L03.115 Cellulitis of right lower limb L24.5 Irritant contact dermatitis due to other chemical products Physician Procedures : CPT4 Code Description Modifier [id number] - WC PHYS LEVEL 3 - EST PT ICD-10 Diagnosis Description I87.321 Chronic venous hypertension (idiopathic) with inflammation of right lower extremity L97.818 Non-pressure chronic ulcer of other part of right  lower leg with other specified severity Quantity: 1 Electronic  Signature(s) Signed: 01/17/2021 5:24:41 PM By: Linton Ham MD Entered By: Linton Ham on 01/17/2021 15:46:20

## 2021-01-23 NOTE — Progress Notes (Signed)
Virtual Visit via Video Note The purpose of this virtual visit is to provide medical care while limiting exposure to the novel coronavirus.    Consent was obtained for video visit:  Yes.   Answered questions that patient had about telehealth interaction:  Yes.   I discussed the limitations, risks, security and privacy concerns of performing an evaluation and management service by telemedicine. I also discussed with the patient that there may be a patient responsible charge related to this service. The patient expressed understanding and agreed to proceed.  Pt location: Home Physician Location: office Name of referring provider:  Biagio Borg, MD I connected with Autumn Massey at patients initiation/request on 01/24/2021 at 10:10 AM EDT by video enabled telemedicine application and verified that I am speaking with the correct person using two identifiers. 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 Follow up 9 months.   History of Present Illness:  RonRoselee Massey a 55 11ar old right-handed woman with hypothyroidism, hyperlipidemia, diabetes, hypertension, hypoventilation, cerebrovascular disease and former smoker who follows up for essential tremor.   UPDATE: Current medication: primidone 29m16m AM and 129mg56mbedtime.  Other medications include Horizant 300mg 3medtime for neuropathic pain.   Tremors are stable.  She had a wound in the right leg which has since healed. However the neuropathy has been aggravated I- not pain but weird sensation in that right foot, particularly the pad of her big toe.    Ulnar neuropathy aggravated if touches the last 2 fingers.     HISTORY:  She underwent left  ulnar decompression surgery in August 2014.  When the cast was removed the following month, she noted twitching of her fifth digit.  An MRI of the cervical spine was performed in December 2014, which revealed no structural etiology for radiculopathy.  She had a NCV-EMG in January 2015 which was essentially normal.  She continued to have numbness, paresthesias and weakness of the fourth and fifth digits of the left hand.  She later developed tremor in both hands, more noticeable in the right hand since she is right-handed dominant.  It occurs when she is writing or holding utensils.  She denies gait difficulty, rigidity, lack of sense of smell, or history consistent with REM sleep behavior.  She is concerned about Parkinson's disease because her maternal grandfather and uncle had it.  Another uncle had tremor.   She has numbness and tingling in the toes and fingers.  She also notes electric pain in the right leg below the knee to the foot.  For neuropathic pain, she has tried gabapentin, Cymbalta, and Lyrica.  Past Medical History: Past Medical History:  Diagnosis Date   Anxiety    ANXIETY DEPRESSION 01/29/2009   04/26/2019- no current   CHF (congestive heart failure) (HCC)  [place]   [place]   HYPERLIPIDEMIA 11/19/2007   HYPERTENSION 11/19/2007   HYPOTHYROIDISM 11/19/2007   Morbid obesity (HCC)  [place] 01/26[place]2019   Neuropathy    PNEUMONIA 08/06/2010   POLYCYTHEMIA 01/29/2009   Preventative health care 04/06/2011   Sleep apnea    mild sleep apnea, on o2 at 2l at nighttime   SLEEP  RELATED HYPOVENTILATION/HYPOXEMIA CCE 02/08/2009   Venous insufficiency 07/19/2012    Medications: Outpatient Encounter Medications as of 01/24/2021  Medication Sig   Accu-Chek FastClix Lancets MISC Use to check blood sugar once a day   acetaminophen (TYLENOL) 500 MG tablet Take 1,000 mg by mouth every 6 (six) hours as needed for mild pain, moderate pain, fever or  headache.    albuterol (PROAIR HFA) 108 (90 Base) MCG/ACT inhaler 2 puffs every 6 hours as needed   albuterol (PROVENTIL) (2.5 MG/3ML) 0.083% nebulizer solution Take 3 mLs (2.5 mg total) by nebulization every 6 (six) hours as needed for wheezing or shortness of breath.   aspirin EC 81 MG tablet Take 81 mg by mouth daily.   bisoprolol-hydrochlorothiazide (ZIAC) 5-6.25 MG tablet Take 1 tablet by mouth daily.   Blood Glucose Monitoring Suppl (ACCU-CHEK AVIVA PLUS) w/Device KIT Use to check blood sugar once a day   Cholecalciferol (VITAMIN D3) 125 MCG (5000 UT) CAPS Take 5,000 Units by mouth daily.   clonazePAM (KLONOPIN) 0.5 MG tablet TAKE 1 TABLET (0.5 MG TOTAL) BY MOUTH 2 (TWO) TIMES DAILY AS NEEDED FOR ANXIETY.   Coenzyme Q10-Vitamin E (COQ10-VITAMIN E) 200-20 MG-UNIT CAPS Take 1 capsule by mouth daily.    cyclobenzaprine (FLEXERIL) 5 MG tablet TAKE 1 TABLET BY MOUTH 3  TIMES DAILY AS NEEDED FOR  MUSCLE SPASM(S)   Diclofenac Sodium (PENNSAID) 2 % SOLN Place 2 g onto the skin 2 (two) times daily.   empagliflozin (JARDIANCE) 10 MG TABS tablet Take 1 tablet (10 mg total) by mouth daily.   fluconazole (DIFLUCAN) 150 MG tablet Take 1 tablet (150 mg total) by mouth every 3 (three) days.   FLUoxetine (PROZAC) 40 MG capsule Take 1 capsule (40 mg total) by mouth daily.   fluticasone furoate-vilanterol (BREO ELLIPTA) 100-25 MCG/INH AEPB INHALE 1 PUFF INTO THE LUNGS EVERY MORNING.   furosemide (LASIX) 40 MG tablet TAKE 1 TABLET BY MOUTH DAILY   Gabapentin Enacarbil ER (HORIZANT) 300 MG TBCR Take 300 mg by mouth at bedtime.   GARLIC PO Take 1 tablet by mouth daily.   glucose blood (ACCU-CHEK AVIVA) test strip Use to check blood sugar once a day - Aviva Plus   irbesartan (AVAPRO) 75 MG tablet Take 1 tablet (75 mg total) by mouth at bedtime.   Lancets Misc. (ACCU-CHEK FASTCLIX LANCET) KIT Use to check blood sugar once a day   levothyroxine (SYNTHROID) 200 MCG tablet TAKE 1 TABLET BY MOUTH  DAILY.    levothyroxine (SYNTHROID) 25 MCG tablet Take 1 tablet (25 mcg total) by mouth daily.   metFORMIN (GLUCOPHAGE-XR) 500 MG 24 hr tablet Take 1 tablet (500 mg total) by mouth daily with breakfast.   montelukast (SINGULAIR) 10 MG tablet Take 1 tablet (10 mg total) by mouth at bedtime.   nystatin cream (MYCOSTATIN) Apply 1 application topically 2 (two) times daily.   ondansetron (ZOFRAN ODT) 4 MG disintegrating tablet Take 1 tablet (4 mg total) by mouth every 8 (eight) hours as needed for nausea or vomiting.   OXYGEN 2lpm with sleep only  [facility]   primidone (MYSOLINE) 50 MG tablet Take 1 tablet in morning and 3 tablets at bedtime   Semaglutide,0.25 or 0.5MG/DOS, (OZEMPIC, 0.25 OR 0.5 MG/DOSE,) 2 MG/1.5ML SOPN Inject 0.5 mg into the skin once a week.   simvastatin (ZOCOR) 40 MG tablet TAKE 1 TABLET BY MOUTH AT bedtime   spironolactone (ALDACTONE) 25 MG tablet Take 1 tablet (25 mg total) by mouth 2 (two) times daily.  No facility-administered encounter medications on file as of 01/24/2021.    Allergies: Allergies  Allergen Reactions   Cymbalta [Duloxetine Hcl]     Family History: Family History  Problem Relation Age of Onset   Cancer Mother        melanoma    Heart disease Mother    Cancer Maternal Grandfather        prostate cancer   Alcohol abuse Other    Arthritis Other    Hypertension Other    High Cholesterol Sister    Psoriasis Brother    Osteoarthritis Maternal Grandmother 51   Scoliosis Maternal Grandmother     Observations/Objective:   Height 5' 5" (1.651 m), weight (!) 319 lb (144.7 kg). No acute distress.  Alert and oriented.  Speech fluent and not dysarthric.  Language intact.     Follow Up Instructions:    -I discussed the assessment and treatment plan with the patient. The patient was provided an opportunity to ask questions and all were answered. The patient agreed with the plan and demonstrated an understanding of the instructions.   The patient was advised to call  back or seek an in-person evaluation if the symptoms worsen or if the condition fails to improve as anticipated.  Dudley Major, DO

## 2021-01-24 ENCOUNTER — Encounter: Payer: Self-pay | Admitting: Neurology

## 2021-01-24 ENCOUNTER — Other Ambulatory Visit: Payer: Self-pay

## 2021-01-24 ENCOUNTER — Telehealth (INDEPENDENT_AMBULATORY_CARE_PROVIDER_SITE_OTHER): Payer: Medicare Other | Admitting: Neurology

## 2021-01-24 VITALS — Ht 65.0 in | Wt 319.0 lb

## 2021-01-24 DIAGNOSIS — G609 Hereditary and idiopathic neuropathy, unspecified: Secondary | ICD-10-CM

## 2021-01-24 DIAGNOSIS — G250 Essential tremor: Secondary | ICD-10-CM | POA: Diagnosis not present

## 2021-01-24 NOTE — Patient Instructions (Signed)
Continue: Primidone '50mg'$  in AM and '150mg'$  in PM Horizant '300mg'$  at night  Follow up 9 months.

## 2021-01-31 ENCOUNTER — Other Ambulatory Visit: Payer: Self-pay | Admitting: Neurology

## 2021-01-31 MED ORDER — HORIZANT 300 MG PO TBCR: 300.0000 mg | EXTENDED_RELEASE_TABLET | Freq: Every day | ORAL | 1 refills | Status: DC

## 2021-01-31 MED ORDER — PRIMIDONE 50 MG PO TABS: ORAL_TABLET | ORAL | 1 refills | Status: DC

## 2021-02-07 ENCOUNTER — Encounter: Payer: Self-pay | Admitting: Internal Medicine

## 2021-02-08 ENCOUNTER — Ambulatory Visit: Payer: Medicare Other

## 2021-02-13 NOTE — Telephone Encounter (Signed)
Was previously faxed and has been faxed again today

## 2021-02-15 ENCOUNTER — Ambulatory Visit
Admission: RE | Admit: 2021-02-15 | Discharge: 2021-02-15 | Disposition: A | Discharge status: Home / Self Care | Payer: Medicare Other | Source: Ambulatory Visit | Attending: Internal Medicine | Admitting: Internal Medicine

## 2021-02-15 ENCOUNTER — Other Ambulatory Visit: Payer: Self-pay

## 2021-02-15 DIAGNOSIS — Z1231 Encounter for screening mammogram for malignant neoplasm of breast: Secondary | ICD-10-CM

## 2021-02-21 ENCOUNTER — Ambulatory Visit: Payer: Medicare Other | Admitting: Internal Medicine

## 2021-02-27 ENCOUNTER — Encounter: Payer: Self-pay | Admitting: Internal Medicine

## 2021-02-27 DIAGNOSIS — J9612 Chronic respiratory failure with hypercapnia: Secondary | ICD-10-CM

## 2021-02-27 DIAGNOSIS — I5032 Chronic diastolic (congestive) heart failure: Secondary | ICD-10-CM

## 2021-02-27 DIAGNOSIS — J9611 Chronic respiratory failure with hypoxia: Secondary | ICD-10-CM

## 2021-03-02 ENCOUNTER — Other Ambulatory Visit: Payer: Self-pay | Admitting: Internal Medicine

## 2021-03-05 ENCOUNTER — Telehealth: Payer: Self-pay

## 2021-03-05 MED ORDER — CLONAZEPAM 0.5 MG PO TABS: 0.5000 mg | ORAL_TABLET | Freq: Two times a day (BID) | ORAL | 5 refills | Status: DC | PRN

## 2021-03-06 ENCOUNTER — Encounter (HOSPITAL_COMMUNITY): Payer: Self-pay

## 2021-03-06 ENCOUNTER — Emergency Department (HOSPITAL_COMMUNITY)
Admission: EM | Admit: 2021-03-06 | Discharge: 2021-03-07 | Disposition: A | Discharge status: Home / Self Care | Payer: Medicare Other | Attending: Emergency Medicine | Admitting: Emergency Medicine

## 2021-03-06 ENCOUNTER — Emergency Department (HOSPITAL_COMMUNITY): Payer: Medicare Other

## 2021-03-06 ENCOUNTER — Other Ambulatory Visit: Payer: Self-pay

## 2021-03-06 DIAGNOSIS — Z79899 Other long term (current) drug therapy: Secondary | ICD-10-CM | POA: Insufficient documentation

## 2021-03-06 DIAGNOSIS — Z8616 Personal history of COVID-19: Secondary | ICD-10-CM | POA: Insufficient documentation

## 2021-03-06 DIAGNOSIS — Z7982 Long term (current) use of aspirin: Secondary | ICD-10-CM | POA: Insufficient documentation

## 2021-03-06 DIAGNOSIS — N390 Urinary tract infection, site not specified: Secondary | ICD-10-CM

## 2021-03-06 DIAGNOSIS — Z87891 Personal history of nicotine dependence: Secondary | ICD-10-CM | POA: Insufficient documentation

## 2021-03-06 DIAGNOSIS — Z7951 Long term (current) use of inhaled steroids: Secondary | ICD-10-CM | POA: Insufficient documentation

## 2021-03-06 DIAGNOSIS — E119 Type 2 diabetes mellitus without complications: Secondary | ICD-10-CM | POA: Insufficient documentation

## 2021-03-06 DIAGNOSIS — N9489 Other specified conditions associated with female genital organs and menstrual cycle: Secondary | ICD-10-CM | POA: Insufficient documentation

## 2021-03-06 DIAGNOSIS — R109 Unspecified abdominal pain: Secondary | ICD-10-CM | POA: Diagnosis not present

## 2021-03-06 DIAGNOSIS — R1084 Generalized abdominal pain: Secondary | ICD-10-CM | POA: Diagnosis not present

## 2021-03-06 DIAGNOSIS — I10 Essential (primary) hypertension: Secondary | ICD-10-CM | POA: Insufficient documentation

## 2021-03-06 DIAGNOSIS — J441 Chronic obstructive pulmonary disease with (acute) exacerbation: Secondary | ICD-10-CM | POA: Insufficient documentation

## 2021-03-06 DIAGNOSIS — R0602 Shortness of breath: Secondary | ICD-10-CM | POA: Diagnosis not present

## 2021-03-06 DIAGNOSIS — Z794 Long term (current) use of insulin: Secondary | ICD-10-CM | POA: Diagnosis not present

## 2021-03-06 DIAGNOSIS — J45901 Unspecified asthma with (acute) exacerbation: Secondary | ICD-10-CM | POA: Diagnosis not present

## 2021-03-06 DIAGNOSIS — K760 Fatty (change of) liver, not elsewhere classified: Secondary | ICD-10-CM | POA: Diagnosis not present

## 2021-03-06 DIAGNOSIS — D72829 Elevated white blood cell count, unspecified: Secondary | ICD-10-CM | POA: Insufficient documentation

## 2021-03-06 DIAGNOSIS — E039 Hypothyroidism, unspecified: Secondary | ICD-10-CM | POA: Insufficient documentation

## 2021-03-06 DIAGNOSIS — Z7984 Long term (current) use of oral hypoglycemic drugs: Secondary | ICD-10-CM | POA: Diagnosis not present

## 2021-03-06 LAB — COMPREHENSIVE METABOLIC PANEL
ALT: 30 U/L (ref 0–44)
AST: 30 U/L (ref 15–41)
Albumin: 4.1 g/dL (ref 3.5–5.0)
Alkaline Phosphatase: 64 U/L (ref 38–126)
Anion gap: 12 (ref 5–15)
BUN: 16 mg/dL (ref 6–20)
CO2: 28 mmol/L (ref 22–32)
Calcium: 9.6 mg/dL (ref 8.9–10.3)
Chloride: 97 mmol/L — ABNORMAL LOW (ref 98–111)
Creatinine, Ser: 0.73 mg/dL (ref 0.44–1.00)
GFR, Estimated: >60 mL/min (ref >60)
Glucose, Bld: 110 mg/dL — ABNORMAL HIGH (ref 70–99)
Potassium: 4.3 mmol/L (ref 3.5–5.1)
Sodium: 137 mmol/L (ref 135–145)
Total Bilirubin: 0.8 mg/dL (ref 0.3–1.2)
Total Protein: 7.8 g/dL (ref 6.5–8.1)

## 2021-03-06 LAB — CBC WITH DIFFERENTIAL/PLATELET
Abs Immature Granulocytes: 0.06 10*3/uL (ref 0.00–0.07)
Basophils Absolute: 0.1 10*3/uL (ref 0.0–0.1)
Basophils Relative: 1 %
Eosinophils Absolute: 0.2 10*3/uL (ref 0.0–0.5)
Eosinophils Relative: 2 %
HCT: 59 % — ABNORMAL HIGH (ref 36.0–46.0)
Hemoglobin: 18.6 g/dL — ABNORMAL HIGH (ref 12.0–15.0)
Immature Granulocytes: 1 %
Lymphocytes Relative: 18 %
Lymphs Abs: 2.1 10*3/uL (ref 0.7–4.0)
MCH: 30.9 pg (ref 26.0–34.0)
MCHC: 31.5 g/dL (ref 30.0–36.0)
MCV: 98.2 fL (ref 80.0–100.0)
Monocytes Absolute: 0.9 10*3/uL (ref 0.1–1.0)
Monocytes Relative: 8 %
Neutro Abs: 8.6 10*3/uL — ABNORMAL HIGH (ref 1.7–7.7)
Neutrophils Relative %: 70 %
Platelets: 181 10*3/uL (ref 150–400)
RBC: 6.01 MIL/uL — ABNORMAL HIGH (ref 3.87–5.11)
RDW: 15.9 % — ABNORMAL HIGH (ref 11.5–15.5)
WBC: 11.9 10*3/uL — ABNORMAL HIGH (ref 4.0–10.5)
nRBC: 0 % (ref 0.0–0.2)

## 2021-03-06 LAB — LIPASE, BLOOD: Lipase: 52 U/L — ABNORMAL HIGH (ref 11–51)

## 2021-03-06 LAB — I-STAT BETA HCG BLOOD, ED (MC, WL, AP ONLY): I-stat hCG, quantitative: <5 m[IU]/mL (ref <5)

## 2021-03-06 NOTE — ED Triage Notes (Addendum)
Pt c/o generalized abdominal pain, distention and constipation x 4 days. Right sided flank pain that began today.   Later reports hx of CHF. Takes Furosemide every other day. Wears 2L [place] at night only. Pain has caused increased shob.

## 2021-03-06 NOTE — ED Provider Notes (Signed)
Emergency Medicine Provider Triage Evaluation Note  Autumn Massey , a 55 y.o. female  was evaluated in triage.  Pt complains of generalized abdominal pain, abdominal distention, and constipation.  Patient reports her symptoms have been present over the last 4 days.  Review of Systems  Positive: Abdominal pain, abdominal distention, constipation, nausea Negative: Fevers, chills, vomiting, blood in stool, melena, dysuria, hematuria  Physical Exam  BP 96/67 (BP Location: Left Arm)   Pulse 71   Temp 98.9 F (37.2 C) (Oral)   Resp 17   Ht '5\' 5"'$  (1.651 m)   Wt (!) 144.7 kg   SpO2 92%   BMI 53.08 kg/m  Gen:   Awake, no distress   Resp:  Normal effort  MSK:   Moves extremities without difficulty  Other:  Abdomen soft, nondistended, nontender, no peritoneal signs.  Medical Decision Making  Medically screening exam initiated at 8:38 PM.  Appropriate orders placed.  Autumn Massey was informed that the remainder of the evaluation will be completed by another provider, this initial triage assessment does not replace that evaluation, and the importance of remaining in the ED until their evaluation is complete.  The patient appears stable so that the remainder of the work up may be completed by another provider.      Autumn Massey 03/06/21 2040    Regan Lemming, MD 03/06/21 2145

## 2021-03-07 ENCOUNTER — Encounter (HOSPITAL_COMMUNITY): Payer: Self-pay

## 2021-03-07 ENCOUNTER — Emergency Department (HOSPITAL_COMMUNITY): Payer: Medicare Other

## 2021-03-07 DIAGNOSIS — K760 Fatty (change of) liver, not elsewhere classified: Secondary | ICD-10-CM | POA: Diagnosis not present

## 2021-03-07 DIAGNOSIS — R109 Unspecified abdominal pain: Secondary | ICD-10-CM | POA: Diagnosis not present

## 2021-03-07 LAB — URINALYSIS, ROUTINE W REFLEX MICROSCOPIC
Bilirubin Urine: NEGATIVE
Glucose, UA: 150 mg/dL — AB
Ketones, ur: NEGATIVE mg/dL
Nitrite: POSITIVE — AB
Protein, ur: NEGATIVE mg/dL
Specific Gravity, Urine: 1.01 (ref 1.005–1.030)
pH: 5 (ref 5.0–8.0)

## 2021-03-07 MED ORDER — DICYCLOMINE HCL 20 MG PO TABS: 20.0000 mg | ORAL_TABLET | Freq: Two times a day (BID) | ORAL | 0 refills | Status: DC

## 2021-03-07 MED ORDER — MORPHINE SULFATE (PF) 4 MG/ML IV SOLN
4.0000 mg | Freq: Once | INTRAVENOUS | Status: AC
  Administered 2021-03-07: 4 mg via INTRAVENOUS
  Filled 2021-03-07: qty 1

## 2021-03-07 MED ORDER — IOHEXOL 350 MG/ML SOLN
100.0000 mL | Freq: Once | INTRAVENOUS | Status: AC | PRN
  Administered 2021-03-07: 100 mL via INTRAVENOUS

## 2021-03-07 MED ORDER — ONDANSETRON HCL 4 MG/2ML IJ SOLN
4.0000 mg | Freq: Once | INTRAMUSCULAR | Status: AC
  Administered 2021-03-07: 4 mg via INTRAVENOUS
  Filled 2021-03-07: qty 2

## 2021-03-07 MED ORDER — ONDANSETRON 4 MG PO TBDP: 4.0000 mg | ORAL_TABLET | Freq: Three times a day (TID) | ORAL | 0 refills | Status: DC | PRN

## 2021-03-07 MED ORDER — SODIUM CHLORIDE 0.9 % IV SOLN
1.0000 g | Freq: Once | INTRAVENOUS | Status: AC
  Administered 2021-03-07: 1 g via INTRAVENOUS
  Filled 2021-03-07: qty 10

## 2021-03-07 MED ORDER — CEPHALEXIN 500 MG PO CAPS: 500.0000 mg | ORAL_CAPSULE | Freq: Two times a day (BID) | ORAL | 0 refills | Status: AC

## 2021-03-07 NOTE — ED Notes (Signed)
Patient states she wears oxygen at home while sleeping. Patient states she feels tightness in her stomach that she thought was constipation. Patient has been taking a laxative. Last BM was yesterday but was small.

## 2021-03-07 NOTE — ED Provider Notes (Signed)
Physical Exam  BP (!) 143/78   Pulse 67   Temp 98.9 F (37.2 C) (Oral)   Resp 20   Ht '5\' 5"'$  (1.651 m)   Wt (!) 144.7 kg   SpO2 91%   BMI 53.08 kg/m   Physical Exam Vitals and nursing note reviewed.  Constitutional:      General: She is not in acute distress.    Appearance: She is well-developed. She is not diaphoretic.  HENT:     Head: Normocephalic and atraumatic.  Eyes:     General: No scleral icterus.    Conjunctiva/sclera: Conjunctivae normal.  Pulmonary:     Effort: Pulmonary effort is normal. No respiratory distress.  Musculoskeletal:     Cervical back: Normal range of motion.  Skin:    Findings: No rash.  Neurological:     Mental Status: She is alert.    ED Course/Procedures     Procedures  MDM   Care of patient assumed from PA Petrucelli at [facility] with history, physical exam and plan.  See their note for further details.  Briefly, 55 y.o. female with PMH/PSH as below who presents with 3-week history of abdominal pain and fullness.  Flank pain nausea began last night.  Triage note mention that she was short of breath although patient denies this.  Chest x-ray is negative here.  She remains on her home oxygen 2 L at night and this is unchanged.  UA is positive for UTI.    Past Medical History:  Diagnosis Date   Anxiety    ANXIETY DEPRESSION 01/29/2009   04/26/2019- no current   CHF (congestive heart failure) ([place])    DEPRESSION 11/19/2007   Diabetes ([place])    FREQUENCY, URINARY 10/17/2009   HYPERLIPIDEMIA 11/19/2007   HYPERTENSION 11/19/2007   HYPOTHYROIDISM 11/19/2007   Morbid obesity ([place])    Neuropathy 01/26/2019   Neuropathy    PNEUMONIA 08/06/2010   POLYCYTHEMIA 01/29/2009   Preventative health care 04/06/2011   Sleep apnea    mild sleep apnea, on o2 at 2l at nighttime   [place] CCE 02/08/2009   Venous insufficiency 07/19/2012   Past Surgical History:  Procedure Laterality Date   DILITATION & CURRETTAGE/HYSTROSCOPY  WITH NOVASURE ABLATION N/A 04/28/2019   Procedure: DILATATION & CURETTAGE/HYSTEROSCOPY WITH NOVASURE ABLATION;  Surgeon: Molli Posey, MD;  Location: [facility];  Service: Gynecology;  Laterality: N/A;  [employer] rep will be here confirmed on 04/20/19   TONSILLECTOMY     ULNAR TUNNEL RELEASE Left 02/22/2013   Procedure: LEFT ULNAR NERVE DECOMPRESSION ;  Surgeon: Tennis Must, MD;  Location: [facility];  Service: Orthopedics;  Laterality: Left;      Current Plan: She is pending a CT scan of the abdomen pelvis to rule out other cause of her pain.   MDM/ED Course:    Consults: CT is negative for concerning findings.  There is a moderate amount of stool that is retained.  Patient states that she does feel as though she has been constipated recently.  We will have her take MiraLAX to help with this.  We will also treat with Keflex for UTI give Bentyl and Zofran to take as needed.  Advised patient to follow-up with primary care provider, drink plenty of fluids and return for any worsening symptoms.   Significant labs/images: DG Chest 2 View  Result Date: 03/06/2021 CLINICAL DATA:  Generalized abdominal pain.  Shortness of breath EXAM: CHEST - 2 VIEW COMPARISON:  09/12/2020 FINDINGS:  Linear scarring in the right middle lobe similar to prior study. No acute confluent opacities or effusions. Heart is normal size. No acute bony abnormality. IMPRESSION: No active cardiopulmonary disease. Electronically Signed   By: Rolm Baptise M.D.   On: 03/06/2021 20:44   CT Abdomen Pelvis W Contrast  Result Date: 03/07/2021 CLINICAL DATA:  3 female with abdominal pain and distension. Constipation. Right flank pain. EXAM: CT ABDOMEN AND PELVIS WITH CONTRAST TECHNIQUE: Multidetector CT imaging of the abdomen and pelvis was performed using the standard protocol following bolus administration of intravenous contrast. CONTRAST:  176m OMNIPAQUE IOHEXOL 350 MG/ML SOLN COMPARISON:  CT Abdomen and  Pelvis 05/01/2018. FINDINGS: Lower chest: Stable, negative. Hepatobiliary: Chronic hepatic steatosis. Otherwise negative liver and gallbladder. Pancreas: Negative. Spleen: Negative, splenule (normal variant). Adrenals/Urinary Tract: Normal adrenal glands. Nonobstructed kidneys appear stable since 2019 with symmetric enhancement and contrast excretion. No nephrolithiasis. Unremarkable bladder. No hydroureter. Stomach/Bowel: Redundant large bowel although little retained stool from the descending colon distally. Occasional distal colon diverticula without active inflammation. Mild to moderate retained stool in the transverse colon. Right colon is more normal. Appendix is diminutive or absent. No large bowel inflammation. Decompressed terminal ileum appears negative. No dilated small bowel. Decompressed stomach. Negative duodenum. No free air, free fluid, mesenteric inflammation. Vascular/Lymphatic: Aortoiliac calcified atherosclerosis. Major arterial structures appear to remain patent. Portal venous system is patent. No lymphadenopathy. Reproductive: Chronic subserosal and exophytic roughly 5 cm fibroid at the left uterine fundus is stable since 2019. Otherwise negative. Other: Large body habitus.  No pelvic free fluid. Musculoskeletal: Advanced lumbar facet arthropathy. Facet and disc degeneration also in the lower thoracic spine. No acute osseous abnormality identified. IMPRESSION: 1. No acute or inflammatory process identified in the abdomen or pelvis. Redundant large bowel but fairly mild or at most moderate volume of retained stool. 2. Chronic hepatic steatosis. Chronic fibroid uterus. Aortic Atherosclerosis (ICD10-I70.0). Electronically Signed   By: HGenevie AnnM.D.   On: 03/07/2021 07:48   MM 3D SCREEN BREAST BILATERAL  Result Date: 02/19/2021 CLINICAL DATA:  Screening. EXAM: DIGITAL SCREENING BILATERAL MAMMOGRAM WITH TOMOSYNTHESIS AND CAD TECHNIQUE: Bilateral screening digital craniocaudal and mediolateral  oblique mammograms were obtained. Bilateral screening digital breast tomosynthesis was performed. The images were evaluated with computer-aided detection. COMPARISON:  Previous exam(s). ACR Breast Density Category b: There are scattered areas of fibroglandular density. FINDINGS: There are no findings suspicious for malignancy. IMPRESSION: No mammographic evidence of malignancy. A result letter of this screening mammogram will be mailed directly to the patient. RECOMMENDATION: Screening mammogram in one year. (Code:SM-B-01Y) BI-RADS CATEGORY  1: Negative. Electronically Signed   By: JMargarette CanadaM.D.   On: 02/19/2021 09:30     I personally reviewed and interpreted all labs.    Patient is hemodynamically stable, in NAD, and able to ambulate in the ED. Evaluation does not show pathology that would require ongoing emergent intervention or inpatient treatment. I explained the diagnosis to the patient. Pain has been managed and has no complaints prior to discharge. Patient is comfortable with above plan and is stable for discharge at this time. All questions were answered prior to disposition. Strict return precautions for returning to the ED were discussed. Encouraged follow up with PCP.   An After Visit Summary was printed and given to the patient.   Portions of this note were generated with DLobbyist Dictation errors may occur despite best attempts at proofreading.    KDelia Heady PA-C 03/07/21 [id number]   [place], MIron River  MD 03/07/21 1623

## 2021-03-07 NOTE — ED Provider Notes (Signed)
[facility] DEPT Provider Note   CSN: [id number] Arrival date & time: 03/06/21  1819     History Chief Complaint  Patient presents with   Abdominal Pain    Autumn Massey is a 55 y.o. female with a hx of CHF, chronic respiratory failure on 2L via [place] @ night, OSA, hypertension, hyperlipidemia PCOS, and T2DM who presents to the emergency department with complaints of abdominal pain for the past 3 to 4 weeks.  Patient states that she is having constant pain to the generalized abdomen that feels like a tight band and like her stomach is very full.  There are no specific alleviating or aggravating factors.  She thought that her pain may have been constipation related therefore she has been taking stool softeners and milk of magnesia, having loose bowel movements with this that do not seem to alleviate her discomfort.  She has had some nausea without vomiting that began last night and states the pain seemed to start to spread into her back on both sides..  She recently restarted Ozempic approximately 5 to 6 weeks ago then stopped it due to side effects.  No other significant medication changes.  She denies fever, chills, emesis, melena, hematochezia, dysuria, vaginal bleeding, or vaginal discharge.  Triage note mentions shortness of breath, I discussed with the patient, she denies this to me.   HPI     Past Medical History:  Diagnosis Date   Anxiety    ANXIETY DEPRESSION 01/29/2009   04/26/2019- no current   CHF (congestive heart failure) ([place])    DEPRESSION 11/19/2007   Diabetes ([place])    FREQUENCY, URINARY 10/17/2009   HYPERLIPIDEMIA 11/19/2007   HYPERTENSION 11/19/2007   HYPOTHYROIDISM 11/19/2007   Morbid obesity ([place])    Neuropathy 01/26/2019   Neuropathy    PNEUMONIA 08/06/2010   POLYCYTHEMIA 01/29/2009   Preventative health care 04/06/2011   Sleep apnea    mild sleep apnea, on o2 at 2l at nighttime   [place] CCE 02/08/2009    Venous insufficiency 07/19/2012    Patient Active Problem List   Diagnosis Date Noted   Right knee pain 10/08/2020   Scratch of right lower leg 10/08/2020   History of COVID-19 09/07/2020   Intermittent chest pain 09/07/2020   Pneumonia due to COVID-19 virus 08/13/2020   Viral illness 07/30/2020   Vitamin D deficiency 01/29/2020   Grief reaction 01/27/2020   Chronic right sacroiliac joint pain 09/05/2019   Degenerative arthritis of right knee 09/05/2019   Neuropathy 01/26/2019   Right hip pain 04/14/2018   COPD exacerbation ([place]) 04/16/2017   Metatarsal stress fracture of left foot 12/08/2016   Stress fracture of left foot 12/08/2016   Dysuria 11/04/2016   Low serum cortisol level ([place]) 10/21/2016   Leukocytosis 10/15/2016   Hyponatremia 10/14/2016   AKI (acute kidney injury) ([place]) 10/14/2016   Nausea 10/09/2016   Leg cramps 10/09/2016   Community acquired pneumonia 09/28/2016   Chronic diastolic congestive heart failure ([place]) 09/28/2016   Asthma exacerbation 12/20/2015   Type 2 diabetes mellitus with hyperglycemia, without long-term current use of insulin ([place]) 11/19/2015   Dyspnea 10/09/2015   Chronic respiratory failure with hypoxia and hypercapnia ([place]) 07/19/2015   COPD GOLD 0 / AB 07/18/2015   Vaginitis 05/31/2015   Thoracic outlet syndrome 01/29/2015   Ulnar neuropathy at elbow of right upper extremity 12/19/2014   Benign essential tremor 10/13/2014   Movement disorder 08/30/2014   Cough 08/30/2014   Rash and  nonspecific skin eruption 03/07/2014   Skin lesion of back 03/07/2014   PCOS (polycystic ovarian syndrome) 10/31/2013   History of CVA (cerebrovascular accident) 08/11/2013   Asthmatic bronchitis 06/10/2013   Acute meniscal tear of right knee 06/06/2013   Sprain of MCL (medial collateral ligament) of knee 05/03/2013   Left arm pain 02/09/2013   Hypoxemia 07/19/2012   Venous insufficiency 07/19/2012   Morbid obesity due to excess calories ([place]) 07/19/2012    Former smoker 04/11/2011   Encounter for well adult exam with abnormal findings 04/06/2011   SLEEP RELATED HYPOVENTILATION/HYPOXEMIA CCE 02/08/2009   POLYCYTHEMIA 01/29/2009   ANXIETY DEPRESSION 01/29/2009   Hypothyroidism 11/19/2007   HLD (hyperlipidemia) 11/19/2007   Depression 11/19/2007   Essential hypertension 11/19/2007    Past Surgical History:  Procedure Laterality Date   DILITATION & CURRETTAGE/HYSTROSCOPY WITH NOVASURE ABLATION N/A 04/28/2019   Procedure: DILATATION & CURETTAGE/HYSTEROSCOPY WITH NOVASURE ABLATION;  Surgeon: Molli Posey, MD;  Location: [facility];  Service: Gynecology;  Laterality: N/A;  [employer] rep will be here confirmed on 04/20/19   TONSILLECTOMY     ULNAR TUNNEL RELEASE Left 02/22/2013   Procedure: LEFT ULNAR NERVE DECOMPRESSION ;  Surgeon: Tennis Must, MD;  Location: [facility];  Service: Orthopedics;  Laterality: Left;     OB History   No obstetric history on file.     Family History  Problem Relation Age of Onset   Cancer Mother        melanoma    Heart disease Mother    Cancer Maternal Grandfather        prostate cancer   Alcohol abuse Other    Arthritis Other    Hypertension Other    High Cholesterol Sister    Psoriasis Brother    Osteoarthritis Maternal Grandmother 99   Scoliosis Maternal Grandmother     Social History   Tobacco Use   Smoking status: Former    Packs/day: 0.50    Years: 22.00    Pack years: 11.00    Types: Cigarettes    Quit date: 05/22/2015    Years since quitting: 5.7   Smokeless tobacco: Never  Vaping Use   Vaping Use: Never used  Substance Use Topics   Alcohol use: Yes    Alcohol/week: 0.0 standard drinks    Comment: rare   Drug use: No    Home Medications Prior to Admission medications   Medication Sig Start Date End Date Taking? Authorizing Provider  Accu-Chek FastClix Lancets MISC Use to check blood sugar once a day 03/01/19   Philemon Kingdom, MD  acetaminophen (TYLENOL) 500  MG tablet Take 1,000 mg by mouth every 6 (six) hours as needed for mild pain, moderate pain, fever or headache.     [provider]  albuterol [facility] HFA) 108 (90 Base) MCG/ACT inhaler 2 puffs every 6 hours as needed 01/27/20   Biagio Borg, MD  albuterol (PROVENTIL) (2.5 MG/3ML) 0.083% nebulizer solution Take 3 mLs (2.5 mg total) by nebulization every 6 (six) hours as needed for wheezing or shortness of breath. 01/27/20   Biagio Borg, MD  aspirin EC 81 MG tablet Take 81 mg by mouth daily.    [provider]  bisoprolol-hydrochlorothiazide (ZIAC) 5-6.25 MG tablet Take 1 tablet by mouth daily. 01/27/20   Biagio Borg, MD  Blood Glucose Monitoring Suppl (ACCU-CHEK AVIVA PLUS) w/Device KIT Use to check blood sugar once a day 11/12/18   Philemon Kingdom, MD  Cholecalciferol (VITAMIN D3) 125 MCG (  5000 UT) CAPS Take 5,000 Units by mouth daily.    [provider]  clonazePAM (KLONOPIN) 0.5 MG tablet Take 1 tablet (0.5 mg total) by mouth 2 (two) times daily as needed for anxiety. 03/05/21   John, James W, MD  Coenzyme Q10-Vitamin E (COQ10-VITAMIN E) 200-20 MG-UNIT CAPS Take 1 capsule by mouth daily.     [provider]  cyclobenzaprine (FLEXERIL) 5 MG tablet TAKE 1 TABLET BY MOUTH 3  TIMES DAILY AS NEEDED FOR  MUSCLE SPASM(S) 01/27/20   John, James W, MD  Diclofenac Sodium (PENNSAID) 2 % SOLN Place 2 g onto the skin 2 (two) times daily. 10/03/19   Smith, Zachary M, DO  empagliflozin (JARDIANCE) 10 MG TABS tablet Take 1 tablet (10 mg total) by mouth daily. 12/07/20   Gherghe, Cristina, MD  FLUoxetine (PROZAC) 40 MG capsule TAKE 1 CAPSULE BY MOUTH  DAILY 03/02/21   John, James W, MD  fluticasone furoate-vilanterol (BREO ELLIPTA) 100-25 MCG/INH AEPB INHALE 1 PUFF INTO THE LUNGS EVERY MORNING. 01/27/20   John, James W, MD  furosemide (LASIX) 40 MG tablet TAKE 1 TABLET BY MOUTH DAILY 09/19/20   O'Neal, Iliamna Thomas, MD  Gabapentin Enacarbil ER (HORIZANT) 300 MG TBCR Take 300 mg by  mouth at bedtime. 01/31/21   Jaffe, Adam R, DO  GARLIC PO Take 1 tablet by mouth daily.    [provider]  glucose blood (ACCU-CHEK AVIVA) test strip Use to check blood sugar once a day - Aviva Plus 03/01/19   Gherghe, Cristina, MD  irbesartan (AVAPRO) 75 MG tablet Take 1 tablet (75 mg total) by mouth at bedtime. 01/27/20   John, James W, MD  Lancets Misc. (ACCU-CHEK FASTCLIX LANCET) KIT Use to check blood sugar once a day 11/12/18   Gherghe, Cristina, MD  levothyroxine (SYNTHROID) 200 MCG tablet TAKE 1 TABLET BY MOUTH  DAILY. 12/07/20   Gherghe, Cristina, MD  levothyroxine (SYNTHROID) 25 MCG tablet Take 1 tablet (25 mcg total) by mouth daily. 12/07/20   Gherghe, Cristina, MD  metFORMIN (GLUCOPHAGE-XR) 500 MG 24 hr tablet Take 1 tablet (500 mg total) by mouth daily with breakfast. 12/07/20   Gherghe, Cristina, MD  montelukast (SINGULAIR) 10 MG tablet TAKE 1 TABLET BY MOUTH AT  BEDTIME 03/02/21   John, James W, MD  nystatin cream (MYCOSTATIN) Apply 1 application topically 2 (two) times daily. 08/20/20   Nichols, Tonya S, NP  ondansetron (ZOFRAN ODT) 4 MG disintegrating tablet Take 1 tablet (4 mg total) by mouth every 8 (eight) hours as needed for nausea or vomiting. 08/20/20   Nichols, Tonya S, NP  OXYGEN 2lpm with sleep only  [facility]    [provider]  primidone (MYSOLINE) 50 MG tablet Take 1 tablet in morning and 3 tablets at bedtime 01/31/21   Jaffe, Adam R, DO  Semaglutide,0.25 or 0.5MG/DOS, (OZEMPIC, 0.25 OR 0.5 MG/DOSE,) 2 MG/1.5ML SOPN Inject 0.5 mg into the skin once a week. 12/06/20   Gherghe, Cristina, MD  simvastatin (ZOCOR) 40 MG tablet TAKE 1 TABLET BY MOUTH AT bedtime 01/27/20   John, James W, MD  spironolactone (ALDACTONE) 25 MG tablet Take 1 tablet (25 mg total) by mouth 2 (two) times daily. 12/07/20   Gherghe, Cristina, MD    Allergies    Cymbalta [duloxetine hcl]  Review of Systems   Review of Systems  Constitutional:  Negative for chills and fever.  Respiratory:  Negative for  shortness of breath.   Cardiovascular:  Negative for chest pain.  Gastrointestinal:    Positive for abdominal pain, constipation and nausea. Negative for blood in stool and vomiting.  Genitourinary:  Positive for flank pain. Negative for dysuria, vaginal bleeding and vaginal discharge.  All other systems reviewed and are negative.  Physical Exam Updated Vital Signs BP (!) 143/78   Pulse 67   Temp 98.9 F (37.2 C) (Oral)   Resp 20   Ht 5' 5" (1.651 m)   Wt (!) 144.7 kg   SpO2 91%   BMI 53.08 kg/m   Physical Exam Vitals and nursing note reviewed.  Constitutional:      General: She is not in acute distress.    Appearance: She is well-developed. She is obese. She is not toxic-appearing.  HENT:     Head: Normocephalic and atraumatic.  Eyes:     General:        Right eye: No discharge.        Left eye: No discharge.     Conjunctiva/sclera: Conjunctivae normal.  Cardiovascular:     Rate and Rhythm: Normal rate and regular rhythm.  Pulmonary:     Effort: Pulmonary effort is normal. No respiratory distress.     Breath sounds: Normal breath sounds. No wheezing, rhonchi or rales.  Abdominal:     General: There is no distension.     Palpations: Abdomen is soft.     Tenderness: There is generalized abdominal tenderness. There is no guarding or rebound.  Musculoskeletal:     Cervical back: Neck supple.     Comments: Chronic appearing skin changes to the bilateral lower legs.  Trace edema to bilateral lower legs.  Skin:    General: Skin is warm and dry.     Findings: No rash.  Neurological:     Mental Status: She is alert.     Comments: Clear speech.   Psychiatric:        Behavior: Behavior normal.    ED Results / Procedures / Treatments   Labs (all labs ordered are listed, but only abnormal results are displayed) Labs Reviewed  LIPASE, BLOOD - Abnormal; Notable for the following components:      Result Value   Lipase 52 (*)    All other components within normal limits   COMPREHENSIVE METABOLIC PANEL - Abnormal; Notable for the following components:   Chloride 97 (*)    Glucose, Bld 110 (*)    All other components within normal limits  CBC WITH DIFFERENTIAL/PLATELET - Abnormal; Notable for the following components:   WBC 11.9 (*)    RBC 6.01 (*)    Hemoglobin 18.6 (*)    HCT 59.0 (*)    RDW 15.9 (*)    Neutro Abs 8.6 (*)    All other components within normal limits  URINALYSIS, ROUTINE W REFLEX MICROSCOPIC  I-STAT BETA HCG BLOOD, ED ([facility], [facility], [facility] ONLY)    EKG None  Radiology DG Chest 2 View  Result Date: 03/06/2021 CLINICAL DATA:  Generalized abdominal pain.  Shortness of breath EXAM: CHEST - 2 VIEW COMPARISON:  09/12/2020 FINDINGS: Linear scarring in the right middle lobe similar to prior study. No acute confluent opacities or effusions. Heart is normal size. No acute bony abnormality. IMPRESSION: No active cardiopulmonary disease. Electronically Signed   By: Kevin  Dover M.D.   On: 03/06/2021 20:44    Procedures Procedures   Medications Ordered in ED Medications - No data to display  ED Course  I have reviewed the triage vital signs and the nursing notes.  Pertinent labs & imaging   results that were available during my care of the patient were reviewed by me and considered in my medical decision making (see chart for details).    MDM Rules/Calculators/A&P                           Patient presents to the ED with complaints of abdominal pain.  Patient nontoxic, resting comfortably, vitals with hypertension.  Generalized abdominal tenderness noted without peritoneal signs.  Additional history obtained:  Additional history obtained from chart review & nursing note review.   Lab Tests:  I reviewed and interpreted labs, which included:  CBC: Mild leukocytosis, hemoglobin and hematocrit are elevated CMP: No significant electrolyte derangements, renal function LFTs are within normal limits Lipase: Minimally elevated UA: Concerning for  infection given nitrite positive with moderate leukocytes and many bacteria, given patient with new flank pain will start rocephin & culture. Reviewed patient's prior urine culture- e. Coli  Preg test: Negative  Plan for CT abdomen/pelvis with contrast for further assessment.  Morphine ordered for pain and Zofran ordered for nausea.  06:30: Patient care signed out to [place] @ change of shift pending CT abdomen/pelvis, re-assessment & disposition.   Portions of this note were generated with [employer]. Dictation errors may occur despite best attempts at proofreading.  Final Clinical Impression(s) / ED Diagnoses Final diagnoses:  None    Rx / DC Orders ED Discharge Orders     None        Amaryllis Dyke, PA-C 51/76/16 0737    Delora Fuel, MD 10/62/69 [phone]

## 2021-03-07 NOTE — Discharge Instructions (Addendum)
Take the antibiotics as directed. Take the Zofran and Bentyl as needed to help with your pain and nausea. You can take MiraLAX to help with your constipation. Follow-up with your primary care provider. Return to the ER if you start to experience worsening pain, uncontrollable vomiting, fever or bloody stools.

## 2021-03-08 ENCOUNTER — Ambulatory Visit: Payer: Medicare Other | Admitting: Internal Medicine

## 2021-03-09 LAB — URINE CULTURE: Culture: >=100000 — AB
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
## 2021-03-14 NOTE — Patient Instructions (Addendum)
    You received a pain injection today.    Have an xray downstairs.    Medications changes include :   vicodin 1 tab every 4 hrs as needed - can worsen your constipation - take the  miralax 1-2 times a day  Your prescription(s) have been submitted to your pharmacy. Please take as directed and contact our office if you believe you are having problem(s) with the medication(s).   A referral was ordered for Sports medicine downstairs.

## 2021-03-15 ENCOUNTER — Ambulatory Visit (INDEPENDENT_AMBULATORY_CARE_PROVIDER_SITE_OTHER): Payer: Medicare Other | Admitting: Internal Medicine

## 2021-03-15 ENCOUNTER — Ambulatory Visit (INDEPENDENT_AMBULATORY_CARE_PROVIDER_SITE_OTHER): Payer: Medicare Other

## 2021-03-15 ENCOUNTER — Encounter: Payer: Self-pay | Admitting: Internal Medicine

## 2021-03-15 ENCOUNTER — Other Ambulatory Visit: Payer: Self-pay

## 2021-03-15 VITALS — BP 126/78 | HR 88 | Temp 98.4°F | Ht 65.0 in | Wt 312.0 lb

## 2021-03-15 DIAGNOSIS — E039 Hypothyroidism, unspecified: Secondary | ICD-10-CM

## 2021-03-15 DIAGNOSIS — R1084 Generalized abdominal pain: Secondary | ICD-10-CM | POA: Diagnosis not present

## 2021-03-15 DIAGNOSIS — M545 Low back pain, unspecified: Secondary | ICD-10-CM

## 2021-03-15 LAB — T4, FREE: Free T4: 1.02 ng/dL (ref 0.60–1.60)

## 2021-03-15 LAB — TSH: TSH: 4.06 u[IU]/mL (ref 0.35–5.50)

## 2021-03-15 MED ORDER — KETOROLAC TROMETHAMINE 60 MG/2ML IM SOLN
60.0000 mg | Freq: Once | INTRAMUSCULAR | Status: AC
  Administered 2021-03-15: 60 mg via INTRAMUSCULAR

## 2021-03-15 MED ORDER — HYDROCODONE-ACETAMINOPHEN 5-325 MG PO TABS: 1.0000 | ORAL_TABLET | ORAL | 0 refills | Status: DC | PRN

## 2021-03-15 NOTE — Addendum Note (Signed)
Addended by: Marcina Millard on: 03/15/2021 04:47 PM   Modules accepted: Orders

## 2021-03-18 ENCOUNTER — Ambulatory Visit (INDEPENDENT_AMBULATORY_CARE_PROVIDER_SITE_OTHER): Payer: Medicare Other | Admitting: Sports Medicine

## 2021-03-18 ENCOUNTER — Other Ambulatory Visit: Payer: Self-pay

## 2021-03-18 VITALS — BP 116/74 | HR 93 | Ht 65.0 in | Wt 313.0 lb

## 2021-03-18 DIAGNOSIS — R1084 Generalized abdominal pain: Secondary | ICD-10-CM | POA: Diagnosis not present

## 2021-03-18 DIAGNOSIS — M545 Low back pain, unspecified: Secondary | ICD-10-CM | POA: Diagnosis not present

## 2021-03-18 DIAGNOSIS — K5900 Constipation, unspecified: Secondary | ICD-10-CM | POA: Diagnosis not present

## 2021-03-18 DIAGNOSIS — R202 Paresthesia of skin: Secondary | ICD-10-CM

## 2021-03-18 NOTE — Patient Instructions (Signed)
Take Miralax for daily BM Follow up as needed

## 2021-03-18 NOTE — Progress Notes (Signed)
Autumn Massey D.[place] Phone: [phone]   Assessment and Plan:     1. Generalized abdominal pain 2. Acute bilateral low back pain without sciatica 3. Constipation, unspecified constipation type -Acute, unchanged, initial sports medicine visit - Patient's circumferential abdominal pain does not appear to be musculoskeletal in nature as patient has CT abdomen/pelvis remarkable for constipation, and unremarkable lumbar x-ray, and no improvement with use of previously prescribed muscle relaxers.  Does not appear related to UTI as patient has completed course and received no relief of symptoms. - Patient's symptoms fit most consistently with constipation as was seen on CT.  As patient has not returned to having her typical 1 BM per day, I recommend that patient titrate MiraLAX for 1 BM per day to see if this decreases pain - Educated patient that previously prescribed Percocet would worsen constipation and likely worsen her symptoms.  I recommend that she does not use it and if she has to use it, to use sparingly.  4.  Paresthesias of both hands - Chronic, worsening on right side, subsequent sports medicine visit - Intermittent paresthesias of bilateral hands most prominent on fourth and fifth digits bilaterally - Shared discussion with patient that we could order C-spine x-ray, nerve conduction study, but patient has more concerning symptoms that she would like to try and alleviate first.  We can continue this work-up at follow-up visit if symptoms persist.  Patient in agreement  Pertinent previous records reviewed include lumbar xray, CT abd/pelvis, ER note, last primary care note   Follow Up: As needed if no improvement or worsening of symptoms   Subjective:   I, Vilma Meckel, am serving as a Education administrator for Dr. Glennon Mac.  Chief Complaint: Abdominal Pressure  HPI:   03/18/21 6 weeks ago started feeling  a pressure in her mid section. Only relief is when she lays down. This painful pressure is a constant pain.  Pain became severe enough for her to go to the emergency department where she received a CT abdomen/pelvis which showed constipation.  She has been using milk of magnesia, but is still only having a bowel movement once every 3 days.  Previously patient says that she had a bowel movement every day and that this is an acute change.  She is planning on picking up MiraLAX today and started to use.  She also complains of a pain at the base of her neck that she feels started when she had to sleep differently with lack of nighttime oxygen and her abdominal pain. She has numbness in both hands, but does have a history of neuropathy in her left arm.  She will intermittently feel weakness in grip strength.  Denies saddle paresthesia, urinary incontinence, trauma, falls.  Relevant Historical Information: Elevated BMI, left arm paresthesias  Additional pertinent review of systems negative.   Current Outpatient Medications:    Accu-Chek FastClix Lancets MISC, Use to check blood sugar once a day, Disp: 100 each, Rfl: 3   acetaminophen (TYLENOL) 500 MG tablet, Take 1,000 mg by mouth every 6 (six) hours as needed for mild pain, moderate pain, fever or headache. , Disp: , Rfl:    albuterol (PROAIR HFA) 108 (90 Base) MCG/ACT inhaler, 2 puffs every 6 hours as needed, Disp: 54 g, Rfl: 3   albuterol (PROVENTIL) (2.5 MG/3ML) 0.083% nebulizer solution, Take 3 mLs (2.5 mg total) by nebulization every 6 (six) hours as needed for wheezing or shortness  of breath., Disp: 75 mL, Rfl: 3   aspirin EC 81 MG tablet, Take 81 mg by mouth daily., Disp: , Rfl:    bisoprolol-hydrochlorothiazide (ZIAC) 5-6.25 MG tablet, Take 1 tablet by mouth daily., Disp: 90 tablet, Rfl: 3   Blood Glucose Monitoring Suppl (ACCU-CHEK AVIVA PLUS) w/Device KIT, Use to check blood sugar once a day, Disp: 1 kit, Rfl: 0   Cholecalciferol (VITAMIN D3) 125  MCG (5000 UT) CAPS, Take 5,000 Units by mouth daily., Disp: , Rfl:    clonazePAM (KLONOPIN) 0.5 MG tablet, Take 1 tablet (0.5 mg total) by mouth 2 (two) times daily as needed for anxiety., Disp: 60 tablet, Rfl: 5   Coenzyme Q10-Vitamin E (COQ10-VITAMIN E) 200-20 MG-UNIT CAPS, Take 1 capsule by mouth daily. , Disp: , Rfl:    cyclobenzaprine (FLEXERIL) 5 MG tablet, TAKE 1 TABLET BY MOUTH 3  TIMES DAILY AS NEEDED FOR  MUSCLE SPASM(S), Disp: 270 tablet, Rfl: 1   Diclofenac Sodium (PENNSAID) 2 % SOLN, Place 2 g onto the skin 2 (two) times daily., Disp: 112 g, Rfl: 3   dicyclomine (BENTYL) 20 MG tablet, Take 1 tablet (20 mg total) by mouth 2 (two) times daily., Disp: 8 tablet, Rfl: 0   empagliflozin (JARDIANCE) 10 MG TABS tablet, Take 1 tablet (10 mg total) by mouth daily., Disp: 90 tablet, Rfl: 3   FLUoxetine (PROZAC) 40 MG capsule, TAKE 1 CAPSULE BY MOUTH  DAILY, Disp: 90 capsule, Rfl: 1   fluticasone furoate-vilanterol (BREO ELLIPTA) 100-25 MCG/INH AEPB, INHALE 1 PUFF INTO THE LUNGS EVERY MORNING., Disp: 90 each, Rfl: 3   furosemide (LASIX) 40 MG tablet, TAKE 1 TABLET BY MOUTH DAILY, Disp: 90 tablet, Rfl: 1   Gabapentin Enacarbil ER (HORIZANT) 300 MG TBCR, Take 300 mg by mouth at bedtime., Disp: 90 tablet, Rfl: 1   GARLIC PO, Take 1 tablet by mouth daily., Disp: , Rfl:    glucose blood (ACCU-CHEK AVIVA) test strip, Use to check blood sugar once a day - Aviva Plus, Disp: 100 each, Rfl: 3   HYDROcodone-acetaminophen (NORCO/VICODIN) 5-325 MG tablet, Take 1 tablet by mouth every 4 (four) hours as needed for severe pain., Disp: 30 tablet, Rfl: 0   irbesartan (AVAPRO) 75 MG tablet, Take 1 tablet (75 mg total) by mouth at bedtime., Disp: 90 tablet, Rfl: 3   Lancets Misc. (ACCU-CHEK FASTCLIX LANCET) KIT, Use to check blood sugar once a day, Disp: 1 kit, Rfl: 0   levothyroxine (SYNTHROID) 200 MCG tablet, TAKE 1 TABLET BY MOUTH  DAILY., Disp: 45 tablet, Rfl: 3   levothyroxine (SYNTHROID) 25 MCG tablet, Take 1  tablet (25 mcg total) by mouth daily., Disp: 45 tablet, Rfl: 3   metFORMIN (GLUCOPHAGE-XR) 500 MG 24 hr tablet, Take 1 tablet (500 mg total) by mouth daily with breakfast., Disp: 90 tablet, Rfl: 3   montelukast (SINGULAIR) 10 MG tablet, TAKE 1 TABLET BY MOUTH AT  BEDTIME, Disp: 90 tablet, Rfl: 1   nystatin cream (MYCOSTATIN), Apply 1 application topically 2 (two) times daily., Disp: 30 g, Rfl: 0   ondansetron (ZOFRAN ODT) 4 MG disintegrating tablet, Take 1 tablet (4 mg total) by mouth every 8 (eight) hours as needed for nausea or vomiting., Disp: 4 tablet, Rfl: 0   OXYGEN, 2lpm with sleep only  [facility], Disp: , Rfl:    primidone (MYSOLINE) 50 MG tablet, Take 1 tablet in morning and 3 tablets at bedtime, Disp: 360 tablet, Rfl: 1   Semaglutide,0.25 or 0.5MG/DOS, (OZEMPIC, 0.25 OR 0.5 MG/DOSE,)  2 MG/1.5ML SOPN, Inject 0.5 mg into the skin once a week., Disp: 4.5 mL, Rfl: 3   simvastatin (ZOCOR) 40 MG tablet, TAKE 1 TABLET BY MOUTH AT bedtime, Disp: 90 tablet, Rfl: 3   spironolactone (ALDACTONE) 25 MG tablet, Take 1 tablet (25 mg total) by mouth 2 (two) times daily., Disp: 180 tablet, Rfl: 3   Objective:     Vitals:   03/18/21 1523  BP: 116/74  Pulse: 93  SpO2: 90%  Weight: (!) 313 lb (142 kg)  Height: '5\' 5"'  (1.651 m)      Body mass index is 52.09 kg/m.    Physical Exam:    Gen: Appears well, nad, nontoxic and pleasant Psych: Alert and oriented, appropriate mood and affect Neuro: sensation intact, strength is 5/5 in upper and lower extremities, muscle tone wnl Skin: no susupicious lesions or rashes  Back - Normal skin, Spine with normal alignment and no deformity.   No tenderness to vertebral process palpation.   Paraspinous muscles are generally tender bilaterally without spasm Straight leg raise negative    Bilateral wrist:  No deformity or swelling appreciated. ROM  Ext 90, flexion70, radial/ulnar deviation 30 nttp over the snauff box, dorsal carpals, volar carpals, radial  styloid, ulnar styloid, 1st mcp, tfcc Neuro:sensation decreased in fourth and fifth digits bilaterally, strength is 5/5 with df/pf/inv/ev, muscle tone wnl Skin:no susupicious lesions or rashes  Electronically signed by:  Autumn Massey D.[facility] 4:46 PM 03/18/21

## 2021-03-21 ENCOUNTER — Encounter: Payer: Self-pay | Admitting: Internal Medicine

## 2021-03-22 ENCOUNTER — Inpatient Hospital Stay: Payer: Medicare Other | Admitting: Internal Medicine

## 2021-04-05 ENCOUNTER — Ambulatory Visit (INDEPENDENT_AMBULATORY_CARE_PROVIDER_SITE_OTHER): Payer: Medicare Other | Admitting: Internal Medicine

## 2021-04-05 ENCOUNTER — Other Ambulatory Visit: Payer: Self-pay

## 2021-04-05 VITALS — BP 118/70 | HR 78 | Ht 65.0 in | Wt 309.0 lb

## 2021-04-05 DIAGNOSIS — Z23 Encounter for immunization: Secondary | ICD-10-CM

## 2021-04-05 DIAGNOSIS — K5900 Constipation, unspecified: Secondary | ICD-10-CM

## 2021-04-05 DIAGNOSIS — J449 Chronic obstructive pulmonary disease, unspecified: Secondary | ICD-10-CM | POA: Diagnosis not present

## 2021-04-05 DIAGNOSIS — E65 Localized adiposity: Secondary | ICD-10-CM | POA: Diagnosis not present

## 2021-04-05 DIAGNOSIS — I10 Essential (primary) hypertension: Secondary | ICD-10-CM

## 2021-04-05 DIAGNOSIS — R1084 Generalized abdominal pain: Secondary | ICD-10-CM | POA: Diagnosis not present

## 2021-04-05 DIAGNOSIS — J9611 Chronic respiratory failure with hypoxia: Secondary | ICD-10-CM | POA: Diagnosis not present

## 2021-04-05 DIAGNOSIS — M5412 Radiculopathy, cervical region: Secondary | ICD-10-CM

## 2021-04-05 DIAGNOSIS — J9612 Chronic respiratory failure with hypercapnia: Secondary | ICD-10-CM

## 2021-04-05 MED ORDER — HYDROCODONE-ACETAMINOPHEN 5-325 MG PO TABS: 1.0000 | ORAL_TABLET | ORAL | 0 refills | Status: DC | PRN

## 2021-04-05 NOTE — Progress Notes (Signed)
Patient ID: Autumn Massey, female   DOB: 1966-02-04, 55 y.o.   MRN: [id number]        Chief Complaint: follow up generalized abd pain, constipation, chronic resp failure, bilateral neck pain and numb hands       HPI:  Autumn Massey is a 55 y.o. female here with c/o several things, the first being unexplaines severe generalized abd pain well documented in recent past with multiple providers and neg CT except for constipation.  Unfortunately miralax bid has improved constipation, but not the pain, which remains severe with sitting up and standing, but much improved to mild on lying down flat on her back. Has experienced early satiety and several lb wt loss, lower appetite.  Has no prior hx of significant GI issues and declines GI referral today.   Also has 2 or more mo worsening bilateral neck pain with radicular symptoms to whole of both arms, and now persistent constant moderate numbness, discomfort and now mild weakness in the grip in the past week.  Also has persistent known low home o2 sat at night, but [facility] conts to fail to provide home o2 for night time per pt despite 3 episodes of effort to get this started.   Pt denies polydipsia, polyuria, or other new focal neuro s/s - has f/u endo Oct 10.   Wt Readings from Last 3 Encounters:  04/05/21 (!) 309 lb (140.2 kg)  03/18/21 (!) 313 lb (142 kg)  03/15/21 (!) 312 lb (141.5 kg)   BP Readings from Last 3 Encounters:  04/05/21 118/70  03/18/21 116/74  03/15/21 126/78         Past Medical History:  Diagnosis Date   Anxiety    ANXIETY DEPRESSION 01/29/2009   04/26/2019- no current   CHF (congestive heart failure) ([place])    DEPRESSION 11/19/2007   Diabetes ([place])    FREQUENCY, URINARY 10/17/2009   HYPERLIPIDEMIA 11/19/2007   HYPERTENSION 11/19/2007   HYPOTHYROIDISM 11/19/2007   Morbid obesity ([place])    Neuropathy 01/26/2019   Neuropathy    PNEUMONIA 08/06/2010   POLYCYTHEMIA 01/29/2009   Preventative health care 04/06/2011   Sleep apnea     mild sleep apnea, on o2 at 2l at nighttime   [place] CCE 02/08/2009   Venous insufficiency 07/19/2012   Past Surgical History:  Procedure Laterality Date   DILITATION & CURRETTAGE/HYSTROSCOPY WITH NOVASURE ABLATION N/A 04/28/2019   Procedure: DILATATION & CURETTAGE/HYSTEROSCOPY WITH NOVASURE ABLATION;  Surgeon: Molli Posey, MD;  Location: [facility];  Service: Gynecology;  Laterality: N/A;  [employer] rep will be here confirmed on 04/20/19   TONSILLECTOMY     ULNAR TUNNEL RELEASE Left 02/22/2013   Procedure: LEFT ULNAR NERVE DECOMPRESSION ;  Surgeon: Tennis Must, MD;  Location: [facility];  Service: Orthopedics;  Laterality: Left;    reports that she quit smoking about 5 years ago. Her smoking use included cigarettes. She has a 11.00 pack-year smoking history. She has never used smokeless tobacco. She reports current alcohol use. She reports that she does not use drugs. family history includes Alcohol abuse in an other family member; Arthritis in an other family member; Cancer in her maternal grandfather and mother; Heart disease in her mother; High Cholesterol in her sister; Hypertension in an other family member; Osteoarthritis (age of onset: 47) in her maternal grandmother; Psoriasis in her brother; Scoliosis in her maternal grandmother. Allergies  Allergen Reactions   Cymbalta [Duloxetine Hcl]    Current Outpatient Medications on  File Prior to Visit  Medication Sig Dispense Refill   Accu-Chek FastClix Lancets MISC Use to check blood sugar once a day 100 each 3   acetaminophen (TYLENOL) 500 MG tablet Take 1,000 mg by mouth every 6 (six) hours as needed for mild pain, moderate pain, fever or headache.      albuterol (PROAIR HFA) 108 (90 Base) MCG/ACT inhaler 2 puffs every 6 hours as needed 54 g 3   albuterol (PROVENTIL) (2.5 MG/3ML) 0.083% nebulizer solution Take 3 mLs (2.5 mg total) by nebulization every 6 (six) hours as needed for wheezing or  shortness of breath. 75 mL 3   aspirin EC 81 MG tablet Take 81 mg by mouth daily.     bisoprolol-hydrochlorothiazide (ZIAC) 5-6.25 MG tablet Take 1 tablet by mouth daily. 90 tablet 3   Blood Glucose Monitoring Suppl (ACCU-CHEK AVIVA PLUS) w/Device KIT Use to check blood sugar once a day 1 kit 0   Cholecalciferol (VITAMIN D3) 125 MCG (5000 UT) CAPS Take 5,000 Units by mouth daily.     clonazePAM (KLONOPIN) 0.5 MG tablet Take 1 tablet (0.5 mg total) by mouth 2 (two) times daily as needed for anxiety. 60 tablet 5   Coenzyme Q10-Vitamin E (COQ10-VITAMIN E) 200-20 MG-UNIT CAPS Take 1 capsule by mouth daily.      cyclobenzaprine (FLEXERIL) 5 MG tablet TAKE 1 TABLET BY MOUTH 3  TIMES DAILY AS NEEDED FOR  MUSCLE SPASM(S) 270 tablet 1   Diclofenac Sodium (PENNSAID) 2 % SOLN Place 2 g onto the skin 2 (two) times daily. 112 g 3   dicyclomine (BENTYL) 20 MG tablet Take 1 tablet (20 mg total) by mouth 2 (two) times daily. 8 tablet 0   empagliflozin (JARDIANCE) 10 MG TABS tablet Take 1 tablet (10 mg total) by mouth daily. 90 tablet 3   FLUoxetine (PROZAC) 40 MG capsule TAKE 1 CAPSULE BY MOUTH  DAILY 90 capsule 1   fluticasone furoate-vilanterol (BREO ELLIPTA) 100-25 MCG/INH AEPB INHALE 1 PUFF INTO THE LUNGS EVERY MORNING. 90 each 3   furosemide (LASIX) 40 MG tablet TAKE 1 TABLET BY MOUTH DAILY 90 tablet 1   Gabapentin Enacarbil ER (HORIZANT) 300 MG TBCR Take 300 mg by mouth at bedtime. 90 tablet 1   GARLIC PO Take 1 tablet by mouth daily.     glucose blood (ACCU-CHEK AVIVA) test strip Use to check blood sugar once a day - Aviva Plus 100 each 3   irbesartan (AVAPRO) 75 MG tablet Take 1 tablet (75 mg total) by mouth at bedtime. 90 tablet 3   Lancets Misc. (ACCU-CHEK FASTCLIX LANCET) KIT Use to check blood sugar once a day 1 kit 0   levothyroxine (SYNTHROID) 200 MCG tablet TAKE 1 TABLET BY MOUTH  DAILY. 45 tablet 3   levothyroxine (SYNTHROID) 25 MCG tablet Take 1 tablet (25 mcg total) by mouth daily. 45 tablet 3    metFORMIN (GLUCOPHAGE-XR) 500 MG 24 hr tablet Take 1 tablet (500 mg total) by mouth daily with breakfast. 90 tablet 3   montelukast (SINGULAIR) 10 MG tablet TAKE 1 TABLET BY MOUTH AT  BEDTIME 90 tablet 1   nystatin cream (MYCOSTATIN) Apply 1 application topically 2 (two) times daily. 30 g 0   ondansetron (ZOFRAN ODT) 4 MG disintegrating tablet Take 1 tablet (4 mg total) by mouth every 8 (eight) hours as needed for nausea or vomiting. 4 tablet 0   OXYGEN 2lpm with sleep only  [facility]     primidone (MYSOLINE) 50 MG tablet Take  1 tablet in morning and 3 tablets at bedtime 360 tablet 1   Semaglutide,0.25 or 0.5MG/DOS, (OZEMPIC, 0.25 OR 0.5 MG/DOSE,) 2 MG/1.5ML SOPN Inject 0.5 mg into the skin once a week. 4.5 mL 3   simvastatin (ZOCOR) 40 MG tablet TAKE 1 TABLET BY MOUTH AT bedtime 90 tablet 3   spironolactone (ALDACTONE) 25 MG tablet Take 1 tablet (25 mg total) by mouth 2 (two) times daily. 180 tablet 3   No current facility-administered medications on file prior to visit.        ROS:  All others reviewed and negative.  Objective        PE:  BP 118/70   Pulse 78   Ht '5\' 5"'  (1.651 m)   Wt (!) 309 lb (140.2 kg)   SpO2 (!) 88%   BMI 51.42 kg/m                 Constitutional: Pt appears in NAD               HENT: Head: NCAT.                Right Ear: External ear normal.                 Left Ear: External ear normal.                Eyes: . Pupils are equal, round, and reactive to light. Conjunctivae and EOM are normal               Nose: without d/c or deformity               Neck: Neck supple. Gross normal ROM               Cardiovascular: Normal rate and regular rhythm.                 Pulmonary/Chest: Effort normal and breath sounds without rales or wheezing.                Abd:  Soft, NT, ND, + BS, no organomegaly               Neurological: Pt is alert. At baseline orientation, motor grossly intact               Skin: Skin is warm. No rashes, no other new lesions, LE edema - none                Psychiatric: Pt behavior is normal without agitation   Micro: none  Cardiac tracings I have personally interpreted today:  none  Pertinent Radiological findings (summarize): none   Lab Results  Component Value Date   WBC 11.9 (H) 03/06/2021   HGB 18.6 (H) 03/06/2021   HCT 59.0 (H) 03/06/2021   PLT 181 03/06/2021   GLUCOSE 110 (H) 03/06/2021   CHOL 162 10/03/2020   TRIG 297.0 (H) 10/03/2020   HDL 31.70 (L) 10/03/2020   LDLDIRECT 95.0 10/03/2020   LDLCALC 109 (H) 01/27/2020   ALT 30 03/06/2021   AST 30 03/06/2021   NA 137 03/06/2021   K 4.3 03/06/2021   CL 97 (L) 03/06/2021   CREATININE 0.73 03/06/2021   BUN 16 03/06/2021   CO2 28 03/06/2021   TSH 4.06 03/15/2021   HGBA1C 8.0 (A) 12/06/2020   MICROALBUR 2.2 01/27/2020   Assessment/Plan:  Autumn Massey is a 55 y.o. White or Caucasian [1] female with  has a past medical history  of Anxiety, ANXIETY DEPRESSION (01/29/2009), CHF (congestive heart failure) ([place]), DEPRESSION (11/19/2007), Diabetes ([place]), FREQUENCY, URINARY (10/17/2009), HYPERLIPIDEMIA (11/19/2007), HYPERTENSION (11/19/2007), HYPOTHYROIDISM (11/19/2007), Morbid obesity ([place]), Neuropathy (01/26/2019), Neuropathy, PNEUMONIA (08/06/2010), POLYCYTHEMIA (01/29/2009), Preventative health care (04/06/2011), Sleep apnea, SLEEP RELATED HYPOVENTILATION/HYPOXEMIA CCE (02/08/2009), and Venous insufficiency (07/19/2012).  Abdominal pain, generalized By process of elimination it appears her pain is due to her excessive pannus and morbid obesity; will cont hydrocodone for now though this will be unusual, and refer plastic surgury; pt again declines GI evalution and does not want more testing she feels she does not need such as EGD  Cervical radiculopathy symptomaticaly worsning, has been offered c spine films and NCS per sport med, but will ask for MRI c spine and may need ortho referral after,  to f/u any worsening symptoms or concerns  Chronic respiratory failure with hypoxia and  hypercapnia (HCC) Pt unable to procure home o2 per [facility]; will change to [facility] 2L nightly only  COPD GOLD 0 / AB O/w stable, cont current med tx - breo  Essential hypertension BP Readings from Last 3 Encounters:  04/05/21 118/70  03/18/21 116/74  03/15/21 126/78   Stable, pt to continue medical treatment ziac, avapro, aldactone   Constipation To continue miralax bid prn while taking narcotic prn  Followup: Return in about 3 months (around 07/06/2021).  [name], MD 04/06/2021 12:44 PM [facility]

## 2021-04-05 NOTE — Patient Instructions (Signed)
Please continue all other medications as before, including the hydrocodone and miralax  You will be contacted regarding the referral for: MRI for the neck AND Plastic Surgury  You will be contacted regarding the referral for: Home oxygen 2L at night only per [facility]  Please have the pharmacy call with any other refills you may need.  Please continue your efforts at being more active, low cholesterol diet, and weight control.  You are otherwise up to date with prevention measures today.  Please keep your appointments with your specialists as you may have planned - Dr Renne Crigler on Monday Oct 10  Please make an Appointment to return in 3 months, or sooner if needed

## 2021-04-06 ENCOUNTER — Encounter: Payer: Self-pay | Admitting: Internal Medicine

## 2021-04-06 NOTE — Assessment & Plan Note (Signed)
To continue miralax bid prn while taking narcotic prn

## 2021-04-06 NOTE — Assessment & Plan Note (Signed)
By process of elimination it appears her pain is due to her excessive pannus and morbid obesity; will cont hydrocodone for now though this will be unusual, and refer plastic surgury; pt again declines GI evalution and does not want more testing she feels she does not need such as EGD

## 2021-04-06 NOTE — Assessment & Plan Note (Signed)
BP Readings from Last 3 Encounters:  04/05/21 118/70  03/18/21 116/74  03/15/21 126/78   Stable, pt to continue medical treatment ziac, avapro, aldactone

## 2021-04-06 NOTE — Assessment & Plan Note (Signed)
symptomaticaly worsning, has been offered c spine films and NCS per sport med, but will ask for MRI c spine and may need ortho referral after,  to f/u any worsening symptoms or concerns

## 2021-04-06 NOTE — Assessment & Plan Note (Signed)
O/w stable, cont current med tx - breo

## 2021-04-06 NOTE — Assessment & Plan Note (Signed)
Pt unable to procure home o2 per [facility]; will change to [facility] 2L nightly only

## 2021-04-08 ENCOUNTER — Other Ambulatory Visit: Payer: Self-pay

## 2021-04-08 ENCOUNTER — Ambulatory Visit (INDEPENDENT_AMBULATORY_CARE_PROVIDER_SITE_OTHER): Payer: Medicare Other | Admitting: Internal Medicine

## 2021-04-08 ENCOUNTER — Encounter: Payer: Self-pay | Admitting: Internal Medicine

## 2021-04-08 VITALS — BP 110/78 | HR 91 | Ht 65.0 in | Wt 308.8 lb

## 2021-04-08 DIAGNOSIS — E282 Polycystic ovarian syndrome: Secondary | ICD-10-CM

## 2021-04-08 DIAGNOSIS — E1165 Type 2 diabetes mellitus with hyperglycemia: Secondary | ICD-10-CM | POA: Diagnosis not present

## 2021-04-08 DIAGNOSIS — E039 Hypothyroidism, unspecified: Secondary | ICD-10-CM | POA: Diagnosis not present

## 2021-04-08 LAB — POCT GLYCOSYLATED HEMOGLOBIN (HGB A1C): Hemoglobin A1C: 8.3 % — AB (ref 4.0–5.6)

## 2021-04-08 NOTE — Progress Notes (Signed)
Patient ID: Autumn Massey, female   DOB: 06-Jul-1965, 55 y.o.   MRN: [id number]  This visit occurred during the SARS-CoV-2 public health emergency.  Safety protocols were in place, including screening questions prior to the visit, additional usage of staff PPE, and extensive cleaning of exam room while observing appropriate contact time as indicated for disinfecting solutions.   HPI  Autumn Massey is a 55 y.o.-year-old female, returning for f/u for DM2, hypothyroidism, and PCOS. Last visit 1 year and 2 months ago. She is on disability.  Interim history: She continues to have shortness of breath with exertion.  Before last visit, she saw cardiology and had evaluation-no cardiovascular issues. Her dose of furosemide was increased but she developed muscle  cramps with it  - mustard. He gained a significant amount of weight after having had COVID at the beginning of the year. She also describes increased abdominal pressure and pain after 2 doses of Ozempic 2.5 mo ago >> stopped Ozempic. However, sxs persisted >> she does not feel this was directly related to Ozempic. She had constipation but resolution of this did not help the sxs.  She was found to have a recent UTI, but treatment with antibiotics did not help her abdominal pain.  She mentions she did not get her Oxygen for the night for 2 months and wonders if this could have caused the problem. She is not hungry as some foods make her nauseated, especially due to the pain. She was referred to a plastic surgeon to remove her abd. [place].   She has numbness and tingling in both hands - starting with pressure in neck >> will have an MRI.  PCOS: Reviewed history:  - weight gain: Maximal weight 364  - was on Phentermine, which worked initially, then stopped working  - Would consider gastric bypass but only as a last resort - + hirsutism >> started to see facial hair in her 30s - + hair loss >> female pattern, and bald spot in vertex - No acne - had  a Mirena IUD placed 04/2013 for heavy menstrual cycles >> out now; no menses since IUD out - has frequent Prednisone tapers    She was previously on spironolactone 50 mg twice daily but her potassium increased so we had to decrease the dose to 25 mg twice daily.  We ended up stopping spironolactone 11/2016.  However, she started to have more facial hair and more frontal balding so we restarted spironolactone 25 mg twice a day.  Her potassium level is normal now: Lab Results  Component Value Date   K 4.3 03/06/2021   Hypothyroidism: Pt. has been dx with hypothyroidism in 2005.  Pt is on levothyroxine (crushed) 225 mcg daily (increased at last visit), taken: - in am - fasting - at least 30 min from b'fast - no Ca, Fe - + MVI 2h later! >> moved >4h later - no PPIs - not on Biotin  Reviewed her TFTs: Lab Results  Component Value Date   TSH 4.06 03/15/2021   TSH 21.97 (H) 12/06/2020   TSH 13.74 (H) 10/03/2020   TSH 42.400 (H) 09/19/2020   TSH 71.60 (H) 01/27/2020   TSH 0.80 10/20/2019   TSH 0.19 (L) 03/01/2019   TSH 0.16 (L) 01/26/2019   TSH 0.65 07/26/2018   TSH 6.36 (H) 04/30/2018   FREET4 1.02 03/15/2021   FREET4 0.70 12/06/2020   FREET4 0.90 10/03/2020   FREET4 0.68 (L) 09/19/2020   FREET4 1.17 10/20/2019   FREET4  1.23 03/01/2019   FREET4 1.21 07/26/2018   FREET4 0.97 04/30/2018   FREET4 1.32 03/02/2018   FREET4 1.25 05/05/2017    Pt denies: - feeling nodules in neck - hoarseness - dysphagia - choking - SOB with lying down  She was on Neurontin for tremors, but had to decrease the dose because of somnolence and then she was able to stop. She is on primidone >> restarted by Dr. Tomi Likens.  She was on Lyrica at night >> switched to Gabapentin.  DM2: Reviewed HbA1c levels: Lab Results  Component Value Date   HGBA1C 8.0 (A) 12/06/2020   HGBA1C 8.3 (H) 10/03/2020   HGBA1C 7.6 (A) 10/20/2019  04/26/2019: HbA1c calculated from fructosamine is 5.4% 03/09/2018: HbA1c  calculated from fructosamine is better, at 5.5%! 09/02/2017: HbA1c calculated from Fructosamine is 6.1%. 05/05/2017: HbA1c calculated from Fructosamine is 6.00%. 03/10/2017: HbA1c calculated from Fructosamine is 6.17% (great!). 11/2016: HbA1c calculated from the fructosamine is radically better, at 5.77%!  She is on: - Metformin 1000 mg with dinner >> 500 mg with dinner >> in a.m. >> at night - Jardiance 10 mg in the pm -  started 02/2017 >> initially stopped b/c UTI and also had a tear on her labia >> resolved.  We restarted Jardiance in 02/2018 and she is tolerating it well now - Ozempic 0.25 >> 0.5 mg weekly - started 11/2020 - off now (see above)  Not taking Januvia >> not covered. She was previously on glipizide.  At this visit, she is not checking sugars at all.  Previously: - am: 131 >> n/c >> 108, 120-130s, 147 >> 120s - lunch: n/c - after lunch: 145-150 >> 142 >> 131-152 >> n/c - dinner: n/c >> 120-140, 157 >> n/c - after dinner: 108 >> 150-160, 170 >> 149-170  She quit smoking 05/2015.  She is on weekly ergocalciferol.  She had a D&C on 04/28/2019.  ROS: + See HPI + Tremors   I reviewed pt's medications, allergies, PMH, social hx, family hx, and changes were documented in the history of present illness. Otherwise, unchanged from my initial visit note.  Past Medical History:  Diagnosis Date   Anxiety    ANXIETY DEPRESSION 01/29/2009   04/26/2019- no current   CHF (congestive heart failure) ([place])    DEPRESSION 11/19/2007   Diabetes ([place])    FREQUENCY, URINARY 10/17/2009   HYPERLIPIDEMIA 11/19/2007   HYPERTENSION 11/19/2007   HYPOTHYROIDISM 11/19/2007   Morbid obesity ([place])    Neuropathy 01/26/2019   Neuropathy    PNEUMONIA 08/06/2010   POLYCYTHEMIA 01/29/2009   Preventative health care 04/06/2011   Sleep apnea    mild sleep apnea, on o2 at 2l at nighttime   [place] CCE 02/08/2009   Venous insufficiency 07/19/2012   Past Surgical History:   Procedure Laterality Date   DILITATION & CURRETTAGE/HYSTROSCOPY WITH NOVASURE ABLATION N/A 04/28/2019   Procedure: DILATATION & CURETTAGE/HYSTEROSCOPY WITH NOVASURE ABLATION;  Surgeon: Molli Posey, MD;  Location: [facility];  Service: Gynecology;  Laterality: N/A;  [employer] rep will be here confirmed on 04/20/19   TONSILLECTOMY     ULNAR TUNNEL RELEASE Left 02/22/2013   Procedure: LEFT ULNAR NERVE DECOMPRESSION ;  Surgeon: Tennis Must, MD;  Location: [facility];  Service: Orthopedics;  Laterality: Left;   Social History   Socioeconomic History   Marital status: Divorced    Spouse name: Not on file   Number of children: 0   Years of education: 12   Highest education  level: 12th grade  Occupational History   Occupation: Disabled    Employer: [employer]  Tobacco Use   Smoking status: Former    Packs/day: 0.50    Years: 22.00    Pack years: 11.00    Types: Cigarettes    Quit date: 05/22/2015    Years since quitting: 5.8   Smokeless tobacco: Never  Vaping Use   Vaping Use: Never used  Substance and Sexual Activity   Alcohol use: Yes    Alcohol/week: 0.0 standard drinks    Comment: rare   Drug use: No   Sexual activity: Never    Birth control/protection: I.U.D.  Other Topics Concern   Not on file  Social History Narrative   Patient is right-handed.   Social Determinants of Health   Financial Resource Strain: Not on file  Food Insecurity: Not on file  Transportation Needs: Not on file  Physical Activity: Not on file  Stress: Not on file  Social Connections: Not on file  Intimate Partner Violence: Not on file   Current Outpatient Medications on File Prior to Visit  Medication Sig Dispense Refill   Accu-Chek FastClix Lancets MISC Use to check blood sugar once a day 100 each 3   acetaminophen (TYLENOL) 500 MG tablet Take 1,000 mg by mouth every 6 (six) hours as needed for mild pain, moderate pain, fever or headache.      albuterol (PROAIR HFA)  108 (90 Base) MCG/ACT inhaler 2 puffs every 6 hours as needed 54 g 3   albuterol (PROVENTIL) (2.5 MG/3ML) 0.083% nebulizer solution Take 3 mLs (2.5 mg total) by nebulization every 6 (six) hours as needed for wheezing or shortness of breath. 75 mL 3   aspirin EC 81 MG tablet Take 81 mg by mouth daily.     bisoprolol-hydrochlorothiazide (ZIAC) 5-6.25 MG tablet Take 1 tablet by mouth daily. 90 tablet 3   Blood Glucose Monitoring Suppl (ACCU-CHEK AVIVA PLUS) w/Device KIT Use to check blood sugar once a day 1 kit 0   Cholecalciferol (VITAMIN D3) 125 MCG (5000 UT) CAPS Take 5,000 Units by mouth daily.     clonazePAM (KLONOPIN) 0.5 MG tablet Take 1 tablet (0.5 mg total) by mouth 2 (two) times daily as needed for anxiety. 60 tablet 5   Coenzyme Q10-Vitamin E (COQ10-VITAMIN E) 200-20 MG-UNIT CAPS Take 1 capsule by mouth daily.      cyclobenzaprine (FLEXERIL) 5 MG tablet TAKE 1 TABLET BY MOUTH 3  TIMES DAILY AS NEEDED FOR  MUSCLE SPASM(S) 270 tablet 1   Diclofenac Sodium (PENNSAID) 2 % SOLN Place 2 g onto the skin 2 (two) times daily. 112 g 3   dicyclomine (BENTYL) 20 MG tablet Take 1 tablet (20 mg total) by mouth 2 (two) times daily. 8 tablet 0   empagliflozin (JARDIANCE) 10 MG TABS tablet Take 1 tablet (10 mg total) by mouth daily. 90 tablet 3   FLUoxetine (PROZAC) 40 MG capsule TAKE 1 CAPSULE BY MOUTH  DAILY 90 capsule 1   fluticasone furoate-vilanterol (BREO ELLIPTA) 100-25 MCG/INH AEPB INHALE 1 PUFF INTO THE LUNGS EVERY MORNING. 90 each 3   furosemide (LASIX) 40 MG tablet TAKE 1 TABLET BY MOUTH DAILY 90 tablet 1   Gabapentin Enacarbil ER (HORIZANT) 300 MG TBCR Take 300 mg by mouth at bedtime. 90 tablet 1   GARLIC PO Take 1 tablet by mouth daily.     glucose blood (ACCU-CHEK AVIVA) test strip Use to check blood sugar once a day - Aviva Plus 100  each 3   HYDROcodone-acetaminophen (NORCO/VICODIN) 5-325 MG tablet Take 1 tablet by mouth every 4 (four) hours as needed for severe pain. 30 tablet 0    irbesartan (AVAPRO) 75 MG tablet Take 1 tablet (75 mg total) by mouth at bedtime. 90 tablet 3   Lancets Misc. (ACCU-CHEK FASTCLIX LANCET) KIT Use to check blood sugar once a day 1 kit 0   levothyroxine (SYNTHROID) 200 MCG tablet TAKE 1 TABLET BY MOUTH  DAILY. 45 tablet 3   levothyroxine (SYNTHROID) 25 MCG tablet Take 1 tablet (25 mcg total) by mouth daily. 45 tablet 3   metFORMIN (GLUCOPHAGE-XR) 500 MG 24 hr tablet Take 1 tablet (500 mg total) by mouth daily with breakfast. 90 tablet 3   montelukast (SINGULAIR) 10 MG tablet TAKE 1 TABLET BY MOUTH AT  BEDTIME 90 tablet 1   nystatin cream (MYCOSTATIN) Apply 1 application topically 2 (two) times daily. 30 g 0   ondansetron (ZOFRAN ODT) 4 MG disintegrating tablet Take 1 tablet (4 mg total) by mouth every 8 (eight) hours as needed for nausea or vomiting. 4 tablet 0   OXYGEN 2lpm with sleep only  [facility]     primidone (MYSOLINE) 50 MG tablet Take 1 tablet in morning and 3 tablets at bedtime 360 tablet 1   Semaglutide,0.25 or 0.5MG/DOS, (OZEMPIC, 0.25 OR 0.5 MG/DOSE,) 2 MG/1.5ML SOPN Inject 0.5 mg into the skin once a week. 4.5 mL 3   simvastatin (ZOCOR) 40 MG tablet TAKE 1 TABLET BY MOUTH AT bedtime 90 tablet 3   spironolactone (ALDACTONE) 25 MG tablet Take 1 tablet (25 mg total) by mouth 2 (two) times daily. 180 tablet 3   No current facility-administered medications on file prior to visit.   Allergies  Allergen Reactions   Cymbalta [Duloxetine Hcl]    Family History  Problem Relation Age of Onset   Cancer Mother        melanoma    Heart disease Mother    Cancer Maternal Grandfather        prostate cancer   Alcohol abuse Other    Arthritis Other    Hypertension Other    High Cholesterol Sister    Psoriasis Brother    Osteoarthritis Maternal Grandmother 99   Scoliosis Maternal Grandmother    PE: BP 110/78 (BP Location: Right Arm, Patient Position: Sitting, Cuff Size: Normal)   Pulse 91   Ht 5' 5" (1.651 m)   Wt (!) 308 lb 12.8 oz (140.1  kg)   SpO2 93%   BMI 51.39 kg/m  Wt Readings from Last 3 Encounters:  04/08/21 (!) 308 lb 12.8 oz (140.1 kg)  04/05/21 (!) 309 lb (140.2 kg)  03/18/21 (!) 313 lb (142 kg)   Constitutional: overweight, in NAD Eyes: PERRLA, EOMI, no exophthalmos ENT: moist mucous membranes, no thyromegaly, no cervical lymphadenopathy Cardiovascular: RRR, No MRG Respiratory: CTA B Gastrointestinal: abdomen soft, NT, ND, BS+ Musculoskeletal: no deformities, strength intact in all 4 Skin: moist, warm, + stasis dermatitis rashes on lower legs R>L Neurological: no tremor with outstretched hands, DTR normal in all 4  PLAN:  1. Patient with longstanding hypothyroidism -Uncontrolled -Before last visit, she had very high TSH levels, 71 and 42 respectively, despite a high dose of levothyroxine.  I was not aware of these results, but I became aware of her TSH from 09/2020 which improved to 13.7.  We discussed about compliance and at last visit we also increase the dose of levothyroxine and I asked her to crush the  pill.  TFTs normalized at last check: - latest thyroid labs reviewed with pt. >> normal: Lab Results  Component Value Date   TSH 4.06 03/15/2021  - she continues on LT4 225 mcg daily-crushed - pt feels good on this dose. - we discussed about taking the thyroid hormone every day, with water, >30 minutes before breakfast, separated by >4 hours from acid reflux medications, calcium, iron, multivitamins. Pt. is taking it correctly.  2. PCOS -She aged out of her PCOS diagnosis -However, she continues to have increased facial hair and alopecia so she continues on spironolactone 25 mg twice a day.  Latest potassium level was normal: Lab Results  Component Value Date   K 4.3 03/06/2021  -We could not increase the dose further due to previous hyperkalemia with higher doses  3. DM2 -Reports uncontrolled more recently -She continues on metformin, SGLT2 inhibitor and at last visit I recommended to add a  weekly GLP-1 receptor agonist.  At that time, HbA1c was better, but still high, at 8.0%. - She does not have a family history of medullary thyroid cancer or personal history of pancreatitis. -At this visit she tells me after 2 doses of Ozempic, she developed abdominal pressure and pain, however, stopping the Ozempic did not resolve the symptoms.  She was constipated at that time but resolving constipation did not help her symptoms.  She also had a UTI, but treating this was again not helpful.  She would very much like to restart Ozempic if possible, but per review of her chart, she had a slightly elevated lipase at the time when she was investigated for her abdominal pain so for now, I would be reticent to restart this.  After her pain resolves, she would be a candidate for this.  For now, I also advised her to start checking blood sugars since she is not checking-year-old and let me know about the values in 2 to 4 weeks.  If she feels better and her blood sugars remain high, we may be able to retry Ozempic.  As of now, especially since she mentions that she is eating smaller portions and already started to lose weight, we will continue with only metformin and SGLT2 inhibitor - I suggested: Patient Instructions  Please continue: - Metformin ER 500 mg with breakfast - Jardiance 10 mg before breakfast  Please continue the crushed levothyroxine 225 g daily.  Take the thyroid hormone every day, with water, at least 30 minutes before breakfast, separated by at least 4 hours from: - acid reflux medications - calcium - iron - multivitamins  Continue spironolactone 25 mg twice a day.  Please return in 3 months.  4.  Obesity class III -Continues Jardiance which should also be helping with weight loss -Was previously also exercising at the pool, but not currently -In the past, she was doing a partially keto diet with intermittent fasting 16-8 -She gained a significant amount of weight during her  COVID infection -She is now eating smaller portions, mostly proteins -Since last visit, she lost 11 pounds  Philemon Kingdom, MD PhD [facility] Endocr[facility]

## 2021-04-08 NOTE — Patient Instructions (Addendum)
Please continue: - Metformin ER 500 mg with breakfast - Jardiance 10 mg before breakfast  Please continue the crushed levothyroxine 225 g daily.  Take the thyroid hormone every day, with water, at least 30 minutes before breakfast, separated by at least 4 hours from: - acid reflux medications - calcium - iron - multivitamins  Continue spironolactone 25 mg twice a day.  Please return in 3 months.

## 2021-04-19 DIAGNOSIS — J449 Chronic obstructive pulmonary disease, unspecified: Secondary | ICD-10-CM | POA: Diagnosis not present

## 2021-04-23 ENCOUNTER — Other Ambulatory Visit: Payer: Self-pay | Admitting: Internal Medicine

## 2021-04-23 NOTE — Telephone Encounter (Signed)
Please refill as per office routine med refill policy (all routine meds to be refilled for 3 mo or monthly (per pt preference) up to one year from last visit, then month to month grace period for 3 mo, then further med refills will have to be denied) ? ?

## 2021-04-26 ENCOUNTER — Encounter: Payer: Self-pay | Admitting: Internal Medicine

## 2021-04-26 ENCOUNTER — Ambulatory Visit
Admission: RE | Admit: 2021-04-26 | Discharge: 2021-04-26 | Disposition: A | Discharge status: Home / Self Care | Payer: Medicare Other | Source: Ambulatory Visit | Attending: Internal Medicine | Admitting: Internal Medicine

## 2021-04-26 DIAGNOSIS — M4802 Spinal stenosis, cervical region: Secondary | ICD-10-CM | POA: Diagnosis not present

## 2021-04-26 DIAGNOSIS — M5412 Radiculopathy, cervical region: Secondary | ICD-10-CM

## 2021-04-26 MED ORDER — HYDROCODONE-ACETAMINOPHEN 5-325 MG PO TABS: 1.0000 | ORAL_TABLET | Freq: Four times a day (QID) | ORAL | 0 refills | Status: DC | PRN

## 2021-04-28 ENCOUNTER — Other Ambulatory Visit: Payer: Self-pay | Admitting: Internal Medicine

## 2021-04-28 ENCOUNTER — Encounter: Payer: Self-pay | Admitting: Internal Medicine

## 2021-04-28 DIAGNOSIS — M5412 Radiculopathy, cervical region: Secondary | ICD-10-CM

## 2021-05-01 ENCOUNTER — Telehealth: Payer: Self-pay | Admitting: Internal Medicine

## 2021-05-01 NOTE — Telephone Encounter (Signed)
LVM for pt to rtn my call to schedule AWV with [facility]. Please schedule this appt if pt calls the office.  °

## 2021-05-13 DIAGNOSIS — M50222 Other cervical disc displacement at C5-C6 level: Secondary | ICD-10-CM | POA: Diagnosis not present

## 2021-05-13 DIAGNOSIS — M50223 Other cervical disc displacement at C6-C7 level: Secondary | ICD-10-CM | POA: Diagnosis not present

## 2021-05-17 ENCOUNTER — Other Ambulatory Visit: Payer: Self-pay | Admitting: Internal Medicine

## 2021-05-17 MED ORDER — HYDROCODONE-ACETAMINOPHEN 5-325 MG PO TABS: 1.0000 | ORAL_TABLET | Freq: Four times a day (QID) | ORAL | 0 refills | Status: DC | PRN

## 2021-05-18 ENCOUNTER — Other Ambulatory Visit: Payer: Self-pay | Admitting: Internal Medicine

## 2021-05-18 NOTE — Telephone Encounter (Signed)
Please refill as per office routine med refill policy (all routine meds to be refilled for 3 mo or monthly (per pt preference) up to one year from last visit, then month to month grace period for 3 mo, then further med refills will have to be denied) ? ?

## 2021-05-20 DIAGNOSIS — J449 Chronic obstructive pulmonary disease, unspecified: Secondary | ICD-10-CM | POA: Diagnosis not present

## 2021-05-31 ENCOUNTER — Other Ambulatory Visit: Payer: Self-pay | Admitting: Internal Medicine

## 2021-05-31 MED ORDER — HYDROCODONE-ACETAMINOPHEN 5-325 MG PO TABS: 1.0000 | ORAL_TABLET | Freq: Four times a day (QID) | ORAL | 0 refills | Status: DC | PRN

## 2021-06-10 ENCOUNTER — Other Ambulatory Visit: Payer: Self-pay | Admitting: Neurosurgery

## 2021-06-17 ENCOUNTER — Encounter (HOSPITAL_COMMUNITY)
Admission: RE | Admit: 2021-06-17 | Discharge: 2021-06-17 | Disposition: A | Discharge status: Home / Self Care | Payer: Medicare Other | Source: Ambulatory Visit | Attending: Neurosurgery | Admitting: Neurosurgery

## 2021-06-17 ENCOUNTER — Other Ambulatory Visit: Payer: Self-pay

## 2021-06-17 ENCOUNTER — Encounter (HOSPITAL_COMMUNITY): Payer: Self-pay

## 2021-06-17 VITALS — BP 116/70 | HR 63 | Temp 97.8°F | Resp 20 | Ht 62.0 in | Wt 307.0 lb

## 2021-06-17 DIAGNOSIS — H9190 Unspecified hearing loss, unspecified ear: Secondary | ICD-10-CM | POA: Insufficient documentation

## 2021-06-17 DIAGNOSIS — E282 Polycystic ovarian syndrome: Secondary | ICD-10-CM | POA: Insufficient documentation

## 2021-06-17 DIAGNOSIS — G250 Essential tremor: Secondary | ICD-10-CM | POA: Insufficient documentation

## 2021-06-17 DIAGNOSIS — Z888 Allergy status to other drugs, medicaments and biological substances status: Secondary | ICD-10-CM | POA: Diagnosis not present

## 2021-06-17 DIAGNOSIS — M4802 Spinal stenosis, cervical region: Secondary | ICD-10-CM | POA: Diagnosis not present

## 2021-06-17 DIAGNOSIS — Z4789 Encounter for other orthopedic aftercare: Secondary | ICD-10-CM | POA: Diagnosis not present

## 2021-06-17 DIAGNOSIS — E1165 Type 2 diabetes mellitus with hyperglycemia: Secondary | ICD-10-CM | POA: Insufficient documentation

## 2021-06-17 DIAGNOSIS — Z8616 Personal history of COVID-19: Secondary | ICD-10-CM | POA: Insufficient documentation

## 2021-06-17 DIAGNOSIS — Z01812 Encounter for preprocedural laboratory examination: Secondary | ICD-10-CM | POA: Insufficient documentation

## 2021-06-17 DIAGNOSIS — J45909 Unspecified asthma, uncomplicated: Secondary | ICD-10-CM | POA: Diagnosis not present

## 2021-06-17 DIAGNOSIS — I509 Heart failure, unspecified: Secondary | ICD-10-CM | POA: Diagnosis not present

## 2021-06-17 DIAGNOSIS — I110 Hypertensive heart disease with heart failure: Secondary | ICD-10-CM | POA: Diagnosis not present

## 2021-06-17 DIAGNOSIS — M50223 Other cervical disc displacement at C6-C7 level: Secondary | ICD-10-CM | POA: Diagnosis not present

## 2021-06-17 DIAGNOSIS — D751 Secondary polycythemia: Secondary | ICD-10-CM | POA: Insufficient documentation

## 2021-06-17 DIAGNOSIS — Z981 Arthrodesis status: Secondary | ICD-10-CM | POA: Diagnosis not present

## 2021-06-17 DIAGNOSIS — E785 Hyperlipidemia, unspecified: Secondary | ICD-10-CM | POA: Insufficient documentation

## 2021-06-17 DIAGNOSIS — G4733 Obstructive sleep apnea (adult) (pediatric): Secondary | ICD-10-CM | POA: Insufficient documentation

## 2021-06-17 DIAGNOSIS — Z85828 Personal history of other malignant neoplasm of skin: Secondary | ICD-10-CM | POA: Diagnosis not present

## 2021-06-17 DIAGNOSIS — Z6841 Body Mass Index (BMI) 40.0 and over, adult: Secondary | ICD-10-CM | POA: Insufficient documentation

## 2021-06-17 DIAGNOSIS — E039 Hypothyroidism, unspecified: Secondary | ICD-10-CM | POA: Diagnosis not present

## 2021-06-17 DIAGNOSIS — Z85038 Personal history of other malignant neoplasm of large intestine: Secondary | ICD-10-CM | POA: Diagnosis not present

## 2021-06-17 DIAGNOSIS — J449 Chronic obstructive pulmonary disease, unspecified: Secondary | ICD-10-CM | POA: Diagnosis not present

## 2021-06-17 DIAGNOSIS — Z9981 Dependence on supplemental oxygen: Secondary | ICD-10-CM | POA: Insufficient documentation

## 2021-06-17 DIAGNOSIS — M50123 Cervical disc disorder at C6-C7 level with radiculopathy: Secondary | ICD-10-CM | POA: Diagnosis not present

## 2021-06-17 DIAGNOSIS — R0600 Dyspnea, unspecified: Secondary | ICD-10-CM | POA: Insufficient documentation

## 2021-06-17 DIAGNOSIS — M2578 Osteophyte, vertebrae: Secondary | ICD-10-CM | POA: Diagnosis not present

## 2021-06-17 DIAGNOSIS — E662 Morbid (severe) obesity with alveolar hypoventilation: Secondary | ICD-10-CM | POA: Diagnosis present

## 2021-06-17 DIAGNOSIS — G4730 Sleep apnea, unspecified: Secondary | ICD-10-CM | POA: Diagnosis not present

## 2021-06-17 DIAGNOSIS — Z7982 Long term (current) use of aspirin: Secondary | ICD-10-CM | POA: Insufficient documentation

## 2021-06-17 DIAGNOSIS — M1990 Unspecified osteoarthritis, unspecified site: Secondary | ICD-10-CM | POA: Diagnosis not present

## 2021-06-17 DIAGNOSIS — E1140 Type 2 diabetes mellitus with diabetic neuropathy, unspecified: Secondary | ICD-10-CM | POA: Diagnosis not present

## 2021-06-17 DIAGNOSIS — Z20822 Contact with and (suspected) exposure to covid-19: Secondary | ICD-10-CM | POA: Diagnosis not present

## 2021-06-17 DIAGNOSIS — I872 Venous insufficiency (chronic) (peripheral): Secondary | ICD-10-CM | POA: Insufficient documentation

## 2021-06-17 DIAGNOSIS — M4322 Fusion of spine, cervical region: Secondary | ICD-10-CM | POA: Diagnosis not present

## 2021-06-17 DIAGNOSIS — M5020 Other cervical disc displacement, unspecified cervical region: Secondary | ICD-10-CM | POA: Diagnosis not present

## 2021-06-17 DIAGNOSIS — F17210 Nicotine dependence, cigarettes, uncomplicated: Secondary | ICD-10-CM | POA: Insufficient documentation

## 2021-06-17 HISTORY — DX: Unspecified hearing loss, unspecified ear: H91.90

## 2021-06-17 HISTORY — DX: Pneumonia, unspecified organism: J18.9

## 2021-06-17 HISTORY — DX: Unspecified asthma, uncomplicated: J45.909

## 2021-06-17 HISTORY — DX: Unspecified osteoarthritis, unspecified site: M19.90

## 2021-06-17 HISTORY — DX: Dyspnea, unspecified: R06.00

## 2021-06-17 LAB — SARS CORONAVIRUS 2 (TAT 6-24 HRS): SARS Coronavirus 2: NEGATIVE

## 2021-06-17 LAB — BASIC METABOLIC PANEL
Anion gap: 11 (ref 5–15)
BUN: 14 mg/dL (ref 6–20)
CO2: 28 mmol/L (ref 22–32)
Calcium: 9.2 mg/dL (ref 8.9–10.3)
Chloride: 99 mmol/L (ref 98–111)
Creatinine, Ser: 0.65 mg/dL (ref 0.44–1.00)
GFR, Estimated: >60 mL/min (ref >60)
Glucose, Bld: 174 mg/dL — ABNORMAL HIGH (ref 70–99)
Potassium: 4.2 mmol/L (ref 3.5–5.1)
Sodium: 138 mmol/L (ref 135–145)

## 2021-06-17 LAB — HEMOGLOBIN A1C
Hgb A1c MFr Bld: 8.4 % — ABNORMAL HIGH (ref 4.8–5.6)
Mean Plasma Glucose: 194.38 mg/dL

## 2021-06-17 LAB — TYPE AND SCREEN
ABO/RH(D): A POS
Antibody Screen: NEGATIVE

## 2021-06-17 LAB — SURGICAL PCR SCREEN
MRSA, PCR: NEGATIVE
Staphylococcus aureus: NEGATIVE

## 2021-06-17 LAB — GLUCOSE, CAPILLARY: Glucose-Capillary: 202 mg/dL — ABNORMAL HIGH (ref 70–99)

## 2021-06-17 NOTE — Progress Notes (Addendum)
PCP - Dr. [name]  Cardiologist - was seen at [facility] - for shortness of breath.Patient had an ECHO.  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  EP-no  Endocrine-Dr. Cruzita Lederer  Pulm-no  Neuro- Dr. Loretta Plume- patient has a tremor and neuropathy.  Chest x-ray - na  EKG - 09/19/20  Stress Test - no  ECHO - 09/19/20  Cardiac Cath - no  AICD-no PM-no LOOP-no  Dialysis-  Sleep Study -  CPAP - no  LABS-CBC, BMP, T/S. PCR, Covid test.  ASA-patient stopped last week, I asked Ms Tsang to figure out which day and inform nurse DOS.  ERAS-yes + Gatorade 2  HA1C-8.4- 06/17/21 Fasting Blood Sugar - "usually 130-`170" Checks Blood Sugar "out of strips and lancets" 0 times a day  Anesthesia- Ms Buxbaum reports that shortness of breath and abdominal pain is from the excess abd skin since she lost weight.  The skin presses on her diaphragm- patient now wears oxygen at 2L when lying down.  Pt denies having chest pain, sob, or fever at this time. All instructions explained to the pt, with a verbal understanding of the material. Pt agrees to go over the instructions while at home for a better understanding. Pt also instructed to self quarantine after being tested for COVID-19. The opportunity to ask questions was provided.

## 2021-06-17 NOTE — Pre-Procedure Instructions (Addendum)
Autumn Massey  06/17/2021      Your procedure is scheduled on Wednesday, 06/19/21  Report to [facility], Main Entrance or Entrance "A" at 10:15 AM.                Your surgery or procedure is scheduled to begin at 12:15   Call this number if you have problems the morning of surgery: [phone]  This is the number for the Pre- Surgical Desk.                For any other questions, please call [phone], Monday - Friday 8 AM - 4 PM.   Remember:  Do not eat after midnight , Tuesday, December 20.  You may drink clear liquids until 9:15 AM .   Clear liquids allowed are:                   Water, Juice (non-citric and without pulp - diabetics please choose diet or no sugar options), Carbonated beverages - (diabetics please choose diet or no sugar options), Clear Tea, Black Coffee only (no creamer, milk or cream including half and half), Plain Jell-O only (diabetics please choose diet or no sugar options), Gatorade (diabetics please choose diet or no sugar options), and Plain Popsicles only    Take these medicines the morning of surgery with A SIP OF WATER : bisoprolol-hydrochlorothiazide fluticasone furoate-vilanterol (BREO ELLIPTA) levothyroxine (SYNTHROID) FLUoxetine (PROZAC) spironolactone (ALDACTONE)  Take/Use if needed: albuterol (PROAIR HFA) inhaler - bring it with you. albuterol (PROVENTIL) nebulizer HYDROcodone-acetaminophen (NORCO/VICODIN)   STOP/ DO not start taking Aspirin, Aspirin Products (Goody Powder, Excedrin Migraine), Ibuprofen (Advil), Naproxen (Aleve),Diclofenac  Vitamins and Herbal Products (ie Fish Oil). Follow your surgeon's instructions regarding Aspirin, if you did not get instructions, call the office and ask.  WHAT DO I DO ABOUT MY DIABETES MEDICATION:      Do Not take: empagliflozin (JARDIANCE)- Tuesday or Wednesday am. Do not take oral diabetes medicines (pills) the morning of surgery. The day of surgery, do not take other diabetes  injectables, including Byetta (exenatide), Bydureon (exenatide ER), Victoza (liraglutide), or Trulicity (dulaglutide).   How to Manage Your Diabetes Before and After Surgery  Why is it important to control my blood sugar before and after surgery? Improving blood sugar levels before and after surgery helps healing and can limit problems. A way of improving blood sugar control is eating a healthy diet by:  Eating less sugar and carbohydrates  Increasing activity/exercise  Talking with your doctor about reaching your blood sugar goals High blood sugars (greater than 180 mg/dL) can raise your risk of infections and slow your recovery, so you will need to focus on controlling your diabetes during the weeks before surgery. Make sure that the doctor who takes care of your diabetes knows about your planned surgery including the date and location.  How do I manage my blood sugar before surgery? Check your blood sugar at least 4 times a day, starting 2 days before surgery, to make sure that the level is not too high or low. Check your blood sugar the morning of your surgery when you wake up and every 2 hours until you get to the Short Stay unit. If your blood sugar is less than 70 mg/dL, you will need to treat for low blood sugar: Do not take insulin. Treat a low blood sugar (less than 70 mg/dL) with  cup of clear juice (cranberry or apple), 4 glucose tablets, OR glucose  gel. Recheck blood sugar in 15 minutes after treatment (to make sure it is greater than 70 mg/dL). If your blood sugar is not greater than 70 mg/dL on recheck, call [phone]  for further instructions. Report your blood sugar to the short stay nurse when you get to Short Stay.  If you are admitted to the hospital after surgery: Your blood sugar will be checked by the staff and you will probably be given insulin after surgery (instead of oral diabetes medicines) to make sure you have good blood sugar levels. The goal for blood  sugar control  after surgery is 80-180 mg/dL Special Instructions: Do not smoke within 24 hours prior to surgery.   [place]- Preparing For Surgery  Before surgery, you can play an important role. Because skin is not sterile, your skin needs to be as free of germs as possible. You can reduce the number of germs on your skin by washing with CHG (chlorahexidine gluconate) Soap before surgery.  CHG is an antiseptic cleaner which kills germs and bonds with the skin to continue killing germs even after washing.    Oral Hygiene is also important to reduce your risk of infection.  Remember - BRUSH YOUR TEETH THE MORNING OF SURGERY WITH YOUR REGULAR TOOTHPASTE  Please do not use if you have an allergy to CHG or antibacterial soaps. If your skin becomes reddened/irritated stop using the CHG.  Do not shave (including legs and underarms) for at least 48 hours prior to first CHG shower. It is OK to shave your face.  Please follow these instructions carefully.   Shower the NIGHT BEFORE SURGERY and the MORNING OF SURGERY with CHG.   If you chose to wash your hair, wash your hair first as usual with your normal shampoo.  After you shampoo, wash your face and private area with the soap you use at home, then rinse your hair and body thoroughly to remove the shampoo and soap.  Use CHG as you would any other liquid soap. You can apply CHG directly to the skin and wash gently with a scrungie or a clean washcloth.   Apply the CHG Soap to your body ONLY FROM THE NECK DOWN.  Do not use on open wounds or open sores. Avoid contact with your eyes, ears, mouth and genitals (private parts).   Wash thoroughly, paying special attention to the area where your surgery will be performed.  Thoroughly rinse your body with warm water from the neck down.  DO NOT shower/wash with your normal soap after using and rinsing off the CHG Soap.  Pat yourself dry with a CLEAN TOWEL.  Wear CLEAN PAJAMAS to bed the night  before surgery, wear comfortable clothes the morning of surgery  Place CLEAN SHEETS on your bed the night of your first shower and DO NOT SLEEP WITH PETS.  Day of Surgery: Shower as instructed above. Do not apply any deodorants/lotions, powders or colognes.  Please wear clean clothes to the hospital/surgery center.   Remember to brush your teeth WITH YOUR REGULAR TOOTHPASTE.  Do not wear jewelry, make-up or nail polish.  Do not shave 48 hours prior to surgery.  Men may shave face and neck.  Do not bring valuables to the hospital.  [facility] is not responsible for any belongings or valuables.  Contacts, dentures or bridgework may not be worn into surgery.  Leave your suitcase in the car.  After surgery it may be brought to your room.  For patients admitted to  the hospital, discharge time will be determined by your treatment team.  Patients discharged the day of surgery will not be allowed to drive home.   Please read over the fact sheets that you were given.

## 2021-06-18 NOTE — Progress Notes (Signed)
Anesthesia Chart Review:  Case: [id number] Date/Time: 06/19/21 0815   Procedure: C6 Corpectomy   Anesthesia type: General   Pre-op diagnosis: Herniated nucleus pulposus, Cervical   Location: [facility] OR ROOM 21 / [place] [facility]   Surgeons: Ashok Pall, MD       DISCUSSION: Patient is a 55 year old female scheduled for the above procedure.  History includes smoking, HTN, HLD, DM2, neuropathy (likely diabetic), venous insufficiency, hypothyroidism, PCOS, dyspnea, asthma, CHF (normal LVEF, grade 1 DD 09/2020 echo), OSA/obesity hypoventilation syndrome (O2 2L at night), benign essential tremor, polycythemia, COVID-19 (07/30/20), hard of hearing, morbid obesity.  She sees several providers/specialists as outlined below. PCP Dr. Jenny Reichmann referred after recent MRI done for worsening symptoms of cervical radiculopathy.   She reported ASA on hold for 1 week prior to surgery.  06/17/2021 presurgical COVID-19 test negative.  Anesthesia team to evaluate on the day of surgery.  VS: BP 116/70    Pulse 63    Temp 36.6 C (Oral)    Resp 20    Ht _0  (1.575 m)    Wt (!) 139.3 kg    SpO2 94%    BMI 56.15 kg/m    PROVIDERS: Biagio Borg, MD is PCP. Last visit 04/05/21.  She was having worsening cervical radiculopathy.  MRI of the C-spine ordered with consideration of orthospine or neurosurgery referral depending on results.  He notes she has generalized abdominal pain which 5 work-up is felt to be related to her excessive pannus "cutis pleonasmus" per patient) and morbid obesity.  She declined GI evaluation but he did refer her to plastic surgery. Metta Clines, DO is neurologist. Last visit 01/24/21.  Philemon Kingdom, MD is endocrinologist. Last visit 04/08/21. A1c 8.3%, up from 8.0%. TSH normalized 4.06 03/15/21 (had been ~13-72 since July 2021).   Eleonore Chiquito, MD is cardiologist. Last visit 09/19/20. She was seen primarily for dyspnea, weight gain, LE edema in setting of recent COVID PNA and significant hypothyroidism  (TSH 71.60 01/27/20 that had not been rechecked) . He ordered echo, LE venous US, CTA chest, BNP, BMP, and referred her back to endocrinology for abnormal TSH. Lasix increased. BNP normal 37.8,  BMP normal except glucose 159, CTA chest showed no PE with mild interstitial opacities RUL, BLE US showed no DVT. 10/10/20 echo showed LVEF 55-60%, no regional wall motion abnormalities, normal RVSF, no significant valvular abnormalities. Six month follow-up planned, but if testing negative then plan for referral back to pulmonology.   Christinia Gully, MD is pulmonologist. Last visit with him on 08/01/19. She had a post-COVID visit with Lazaro Arms, NP, last on 09/05/20 and continued to improve.    LABS: Labs reviewed: Acceptable for surgery. It appears CBC was not done or not released at PAT visit, so will need to be done on the day of surgery. (all labs ordered are listed, but only abnormal results are displayed)  Labs Reviewed  GLUCOSE, CAPILLARY - Abnormal; Notable for the following components:      Result Value   Glucose-Capillary 202 (*)    All other components within normal limits  BASIC METABOLIC PANEL - Abnormal; Notable for the following components:   Glucose, Bld 174 (*)    All other components within normal limits  HEMOGLOBIN A1C - Abnormal; Notable for the following components:   Hgb A1c MFr Bld 8.4 (*)    All other components within normal limits  SURGICAL PCR SCREEN  SARS CORONAVIRUS 2 (TAT 6-24 HRS)  TYPE AND SCREEN    PFTs > 5 years ago.   IMAGES: MRI C-spine 04/26/21: IMPRESSION: 1. Technically limited exam due to habitus and motion artifact. 2. Large central/right paracentral disc extrusion with superior migration at C6-7 with resultant severe spinal stenosis and cord impingement, worse on the right. No definite cord signal changes on this motion degraded exam. 3. Disc bulge with uncovertebral spurring at C5-6 with resultant mild canal and right C6 foraminal stenosis. 4.  Remote pontine lacunar infarct, stable.   CXR 03/06/21: FINDINGS: Linear scarring in the right middle lobe similar to prior study. No acute confluent opacities or effusions. Heart is normal size. No acute bony abnormality. IMPRESSION: No active cardiopulmonary disease.     EKG: 09/19/20: Normal sinus rhythm Prolonged QT ((QT 430 ms, QTc 480 ms)   CV: Echo 10/10/20: IMPRESSIONS   1. Left ventricular ejection fraction, by estimation, is 55 to 60%. The  left ventricle has normal function. The left ventricle has no regional  wall motion abnormalities. There is mild left ventricular hypertrophy.  Left ventricular diastolic parameters  are consistent with Grade I diastolic dysfunction (impaired relaxation).   2. Right ventricular systolic function is normal. The right ventricular  size is normal. Tricuspid regurgitation signal is inadequate for assessing  PA pressure.   3. The mitral valve is normal in structure. No evidence of mitral valve  regurgitation. No evidence of mitral stenosis.   4. The aortic valve was not well visualized. Aortic valve regurgitation  is not visualized. No aortic stenosis is present.   5. The inferior vena cava is normal in size with greater than 50%  respiratory variability, suggesting right atrial pressure of 3 mmHg.   6. Technically difficult study with poor acoustic windows.    BLE Venous US 10/03/20: Summary:  RIGHT:  - No evidence of deep vein thrombosis in the lower extremity. No indirect  evidence of obstruction proximal to the inguinal ligament.  - Portions of this examination were limited- see technologist comments  above.  - No cystic structure found in the popliteal fossa.     LEFT:  - No evidence of deep vein thrombosis in the lower extremity. No indirect  evidence of obstruction proximal to the inguinal ligament.  - Portions of this examination were limited- see technologist comments  above.  - No cystic structure found in the popliteal  fossa.      Past Medical History:  Diagnosis Date   Anxiety    ANXIETY DEPRESSION 01/29/2009   04/26/2019- no current   Arthritis    Asthma    CHF (congestive heart failure) ([place])    DEPRESSION 11/19/2007   Diabetes ([place])    Dyspnea    FREQUENCY, URINARY 10/17/2009   HOH (hard of hearing)    HYPERLIPIDEMIA 11/19/2007   HYPERTENSION 11/19/2007   HYPOTHYROIDISM 11/19/2007   Morbid obesity ([place])    Neuropathy 01/26/2019   Neuropathy    PNEUMONIA 08/06/2010   Pneumonia    POLYCYTHEMIA 01/29/2009   Preventative health care 04/06/2011   Sleep apnea    mild sleep apnea, on o2 at 2l at nighttime   [place] CCE 02/08/2009   Venous insufficiency 07/19/2012    Past Surgical History:  Procedure Laterality Date   DILITATION & CURRETTAGE/HYSTROSCOPY WITH NOVASURE ABLATION N/A 04/28/2019   Procedure: DILATATION & CURETTAGE/HYSTEROSCOPY WITH NOVASURE ABLATION;  Surgeon: Molli Posey, MD;  Location: [place];  Service: Gynecology;  Laterality: N/A;  [employer] rep will be here confirmed on 04/20/19  TONSILLECTOMY     ULNAR TUNNEL RELEASE Left 02/22/2013   Procedure: LEFT ULNAR NERVE DECOMPRESSION ;  Surgeon: Tennis Must, MD;  Location: [place];  Service: Orthopedics;  Laterality: Left;    MEDICATIONS:  Accu-Chek FastClix Lancets MISC   albuterol (PROAIR HFA) 108 (90 Base) MCG/ACT inhaler   albuterol (PROVENTIL) (2.5 MG/3ML) 0.083% nebulizer solution   aspirin EC 81 MG tablet   bisoprolol-hydrochlorothiazide (ZIAC) 5-6.25 MG tablet   Blood Glucose Monitoring Suppl (ACCU-CHEK AVIVA PLUS) w/Device KIT   Cholecalciferol (VITAMIN D3) 75 MCG (3000 UT) TABS   clonazePAM (KLONOPIN) 0.5 MG tablet   cyclobenzaprine (FLEXERIL) 5 MG tablet   Diclofenac Sodium (PENNSAID) 2 % SOLN   dicyclomine (BENTYL) 20 MG tablet   empagliflozin (JARDIANCE) 10 MG TABS tablet   FLUoxetine (PROZAC) 40 MG capsule   fluticasone furoate-vilanterol (BREO ELLIPTA)  100-25 MCG/INH AEPB   furosemide (LASIX) 40 MG tablet   Gabapentin Enacarbil ER (HORIZANT) 300 MG TBCR   glucose blood (ACCU-CHEK AVIVA) test strip   HYDROcodone-acetaminophen (NORCO/VICODIN) 5-325 MG tablet   irbesartan (AVAPRO) 75 MG tablet   Lancets Misc. (ACCU-CHEK FASTCLIX LANCET) KIT   levothyroxine (SYNTHROID) 200 MCG tablet   levothyroxine (SYNTHROID) 25 MCG tablet   metFORMIN (GLUCOPHAGE-XR) 500 MG 24 hr tablet   montelukast (SINGULAIR) 10 MG tablet   Multiple Vitamin (MULTIVITAMIN WITH MINERALS) TABS tablet   nystatin cream (MYCOSTATIN)   ondansetron (ZOFRAN ODT) 4 MG disintegrating tablet   OXYGEN   primidone (MYSOLINE) 50 MG tablet   Semaglutide,0.25 or 0.5MG/DOS, (OZEMPIC, 0.25 OR 0.5 MG/DOSE,) 2 MG/1.5ML SOPN   simvastatin (ZOCOR) 40 MG tablet   spironolactone (ALDACTONE) 25 MG tablet   No current facility-administered medications for this encounter.    Myra Gianotti, PA-C Surgical Short Stay/Anesthesiology [facility] Phone [phone] [facility] Phone [phone] 06/18/2021 12:07 PM

## 2021-06-18 NOTE — Progress Notes (Signed)
Pt called confirming her surgery time change of  0830-1221, arrival 0630. Pt also advised to shower twice with the antibacterial soap that was given.

## 2021-06-18 NOTE — Anesthesia Preprocedure Evaluation (Addendum)
Anesthesia Evaluation  Patient identified by MRN, date of birth, ID band Patient awake    Reviewed: Allergy & Precautions, NPO status , Patient's Chart, lab work & pertinent test results  Airway Mallampati: III  TM Distance: >3 FB Neck ROM: Limited    Dental  (+) Dental Advisory Given   Pulmonary asthma , sleep apnea and Oxygen sleep apnea , COPD, Current Smoker,    breath sounds clear to auscultation       Cardiovascular hypertension, Pt. on medications +CHF   Rhythm:Regular Rate:Normal     Neuro/Psych  Neuromuscular disease    GI/Hepatic negative GI ROS, Neg liver ROS,   Endo/Other  diabetesHypothyroidism Morbid obesity  Renal/GU Renal disease     Musculoskeletal  (+) Arthritis ,   Abdominal   Peds  Hematology negative hematology ROS (+)   Anesthesia Other Findings   Reproductive/Obstetrics                            Anesthesia Physical Anesthesia Plan  ASA: 4  Anesthesia Plan: General   Post-op Pain Management: Tylenol PO (pre-op)   Induction: Intravenous  PONV Risk Score and Plan: 2 and Dexamethasone and Treatment may vary due to age or medical condition  Airway Management Planned: Oral ETT and Video Laryngoscope Planned  Additional Equipment: Arterial line  Intra-op Plan:   Post-operative Plan: Extubation in OR and Possible Post-op intubation/ventilation  Informed Consent: I have reviewed the patients History and Physical, chart, labs and discussed the procedure including the risks, benefits and alternatives for the proposed anesthesia with the patient or authorized representative who has indicated his/her understanding and acceptance.     Dental advisory given  Plan Discussed with: CRNA  Anesthesia Plan Comments: ( )      Anesthesia Quick Evaluation

## 2021-06-19 ENCOUNTER — Other Ambulatory Visit: Payer: Self-pay

## 2021-06-19 ENCOUNTER — Inpatient Hospital Stay (HOSPITAL_COMMUNITY)
Admission: RE | Admit: 2021-06-19 | Discharge: 2021-06-20 | DRG: 472 | Disposition: A | Discharge status: Home / Self Care | Payer: Medicare Other | Attending: Neurosurgery | Admitting: Neurosurgery

## 2021-06-19 ENCOUNTER — Encounter (HOSPITAL_COMMUNITY): Payer: Self-pay | Admitting: Neurosurgery

## 2021-06-19 ENCOUNTER — Inpatient Hospital Stay (HOSPITAL_COMMUNITY): Payer: Medicare Other | Admitting: Vascular Surgery

## 2021-06-19 ENCOUNTER — Inpatient Hospital Stay (HOSPITAL_COMMUNITY): Payer: Medicare Other

## 2021-06-19 ENCOUNTER — Inpatient Hospital Stay (HOSPITAL_COMMUNITY)
Admission: RE | Disposition: A | Discharge status: Home / Self Care | Payer: Self-pay | Source: Home / Self Care | Attending: Neurosurgery

## 2021-06-19 ENCOUNTER — Inpatient Hospital Stay (HOSPITAL_COMMUNITY): Payer: Medicare Other | Admitting: Anesthesiology

## 2021-06-19 DIAGNOSIS — Z419 Encounter for procedure for purposes other than remedying health state, unspecified: Secondary | ICD-10-CM

## 2021-06-19 DIAGNOSIS — M4802 Spinal stenosis, cervical region: Secondary | ICD-10-CM | POA: Diagnosis present

## 2021-06-19 DIAGNOSIS — I872 Venous insufficiency (chronic) (peripheral): Secondary | ICD-10-CM | POA: Diagnosis present

## 2021-06-19 DIAGNOSIS — E1165 Type 2 diabetes mellitus with hyperglycemia: Secondary | ICD-10-CM

## 2021-06-19 DIAGNOSIS — H9190 Unspecified hearing loss, unspecified ear: Secondary | ICD-10-CM | POA: Diagnosis present

## 2021-06-19 DIAGNOSIS — Z01812 Encounter for preprocedural laboratory examination: Secondary | ICD-10-CM

## 2021-06-19 DIAGNOSIS — M5020 Other cervical disc displacement, unspecified cervical region: Secondary | ICD-10-CM | POA: Diagnosis present

## 2021-06-19 DIAGNOSIS — Z85038 Personal history of other malignant neoplasm of large intestine: Secondary | ICD-10-CM | POA: Diagnosis not present

## 2021-06-19 DIAGNOSIS — M4322 Fusion of spine, cervical region: Secondary | ICD-10-CM | POA: Diagnosis not present

## 2021-06-19 DIAGNOSIS — M1990 Unspecified osteoarthritis, unspecified site: Secondary | ICD-10-CM | POA: Diagnosis present

## 2021-06-19 DIAGNOSIS — Z85828 Personal history of other malignant neoplasm of skin: Secondary | ICD-10-CM

## 2021-06-19 DIAGNOSIS — E662 Morbid (severe) obesity with alveolar hypoventilation: Secondary | ICD-10-CM | POA: Diagnosis present

## 2021-06-19 DIAGNOSIS — E785 Hyperlipidemia, unspecified: Secondary | ICD-10-CM | POA: Diagnosis present

## 2021-06-19 DIAGNOSIS — Z6841 Body Mass Index (BMI) 40.0 and over, adult: Secondary | ICD-10-CM | POA: Diagnosis not present

## 2021-06-19 DIAGNOSIS — I509 Heart failure, unspecified: Secondary | ICD-10-CM | POA: Diagnosis present

## 2021-06-19 DIAGNOSIS — E1140 Type 2 diabetes mellitus with diabetic neuropathy, unspecified: Secondary | ICD-10-CM | POA: Diagnosis present

## 2021-06-19 DIAGNOSIS — M2578 Osteophyte, vertebrae: Secondary | ICD-10-CM | POA: Diagnosis present

## 2021-06-19 DIAGNOSIS — I110 Hypertensive heart disease with heart failure: Secondary | ICD-10-CM | POA: Diagnosis present

## 2021-06-19 DIAGNOSIS — E282 Polycystic ovarian syndrome: Secondary | ICD-10-CM | POA: Diagnosis present

## 2021-06-19 DIAGNOSIS — Z20822 Contact with and (suspected) exposure to covid-19: Secondary | ICD-10-CM | POA: Diagnosis present

## 2021-06-19 DIAGNOSIS — E039 Hypothyroidism, unspecified: Secondary | ICD-10-CM | POA: Diagnosis present

## 2021-06-19 DIAGNOSIS — M50123 Cervical disc disorder at C6-C7 level with radiculopathy: Secondary | ICD-10-CM | POA: Diagnosis present

## 2021-06-19 DIAGNOSIS — Z888 Allergy status to other drugs, medicaments and biological substances status: Secondary | ICD-10-CM | POA: Diagnosis not present

## 2021-06-19 DIAGNOSIS — J45909 Unspecified asthma, uncomplicated: Secondary | ICD-10-CM | POA: Diagnosis present

## 2021-06-19 DIAGNOSIS — Z4789 Encounter for other orthopedic aftercare: Secondary | ICD-10-CM | POA: Diagnosis not present

## 2021-06-19 DIAGNOSIS — G4730 Sleep apnea, unspecified: Secondary | ICD-10-CM | POA: Diagnosis not present

## 2021-06-19 DIAGNOSIS — Z981 Arthrodesis status: Secondary | ICD-10-CM | POA: Diagnosis not present

## 2021-06-19 HISTORY — PX: ANTERIOR CERVICAL CORPECTOMY: SHX1159

## 2021-06-19 LAB — CBC
HCT: 53.4 % — ABNORMAL HIGH (ref 36.0–46.0)
Hemoglobin: 16.7 g/dL — ABNORMAL HIGH (ref 12.0–15.0)
MCH: 31 pg (ref 26.0–34.0)
MCHC: 31.3 g/dL (ref 30.0–36.0)
MCV: 99.3 fL (ref 80.0–100.0)
Platelets: 152 10*3/uL (ref 150–400)
RBC: 5.38 MIL/uL — ABNORMAL HIGH (ref 3.87–5.11)
RDW: 13.6 % (ref 11.5–15.5)
WBC: 9.9 10*3/uL (ref 4.0–10.5)
nRBC: 0 % (ref 0.0–0.2)

## 2021-06-19 LAB — GLUCOSE, CAPILLARY
Glucose-Capillary: 204 mg/dL — ABNORMAL HIGH (ref 70–99)
Glucose-Capillary: 201 mg/dL — ABNORMAL HIGH (ref 70–99)
Glucose-Capillary: 234 mg/dL — ABNORMAL HIGH (ref 70–99)
Glucose-Capillary: 262 mg/dL — ABNORMAL HIGH (ref 70–99)
Glucose-Capillary: 245 mg/dL — ABNORMAL HIGH (ref 70–99)
Glucose-Capillary: 171 mg/dL — ABNORMAL HIGH (ref 70–99)

## 2021-06-19 LAB — POCT PREGNANCY, URINE: Preg Test, Ur: NEGATIVE

## 2021-06-19 SURGERY — ANTERIOR CERVICAL CORPECTOMY
Anesthesia: General

## 2021-06-19 MED ORDER — PROPOFOL 10 MG/ML IV BOLUS
INTRAVENOUS | Status: DC | PRN
  Administered 2021-06-19: 50 mg via INTRAVENOUS
  Administered 2021-06-19: 150 mg via INTRAVENOUS

## 2021-06-19 MED ORDER — FLUOXETINE HCL 20 MG PO CAPS
40.0000 mg | ORAL_CAPSULE | Freq: Every day | ORAL | Status: DC
  Administered 2021-06-20: 09:00:00 40 mg via ORAL
  Filled 2021-06-19: qty 2

## 2021-06-19 MED ORDER — SPIRONOLACTONE 25 MG PO TABS
25.0000 mg | ORAL_TABLET | Freq: Two times a day (BID) | ORAL | Status: DC
  Administered 2021-06-19 – 2021-06-20 (×2): 25 mg via ORAL
  Filled 2021-06-19 (×2): qty 1

## 2021-06-19 MED ORDER — ROCURONIUM BROMIDE 10 MG/ML (PF) SYRINGE
PREFILLED_SYRINGE | INTRAVENOUS | Status: DC | PRN
  Administered 2021-06-19: 30 mg via INTRAVENOUS
  Administered 2021-06-19 (×2): 50 mg via INTRAVENOUS
  Administered 2021-06-19: 70 mg via INTRAVENOUS

## 2021-06-19 MED ORDER — EMPAGLIFLOZIN 10 MG PO TABS: 10.0000 mg | ORAL_TABLET | Freq: Every day | ORAL | Status: DC

## 2021-06-19 MED ORDER — DICLOFENAC SODIUM 2 % EX SOLN: 2.0000 g | Freq: Two times a day (BID) | CUTANEOUS | Status: DC | PRN

## 2021-06-19 MED ORDER — KETAMINE HCL 10 MG/ML IJ SOLN
INTRAMUSCULAR | Status: DC | PRN
  Administered 2021-06-19: 10 mg via INTRAVENOUS
  Administered 2021-06-19: 30 mg via INTRAVENOUS

## 2021-06-19 MED ORDER — VITAMIN D 25 MCG (1000 UNIT) PO TABS
3000.0000 [IU] | ORAL_TABLET | Freq: Every day | ORAL | Status: DC
  Administered 2021-06-19 – 2021-06-20 (×2): 3000 [IU] via ORAL
  Filled 2021-06-19 (×2): qty 3

## 2021-06-19 MED ORDER — BISACODYL 5 MG PO TBEC: 5.0000 mg | DELAYED_RELEASE_TABLET | Freq: Every day | ORAL | Status: DC | PRN

## 2021-06-19 MED ORDER — METFORMIN HCL ER 500 MG PO TB24
500.0000 mg | ORAL_TABLET | Freq: Every day | ORAL | Status: DC
  Administered 2021-06-20: 07:00:00 500 mg via ORAL
  Filled 2021-06-19: qty 1

## 2021-06-19 MED ORDER — SEMAGLUTIDE(0.25 OR 0.5MG/DOS) 2 MG/1.5ML ~~LOC~~ SOPN: 0.5000 mg | PEN_INJECTOR | SUBCUTANEOUS | Status: DC

## 2021-06-19 MED ORDER — MONTELUKAST SODIUM 10 MG PO TABS
10.0000 mg | ORAL_TABLET | Freq: Every day | ORAL | Status: DC
  Administered 2021-06-19: 21:00:00 10 mg via ORAL
  Filled 2021-06-19: qty 1

## 2021-06-19 MED ORDER — LACTATED RINGERS IV SOLN: INTRAVENOUS | Status: DC

## 2021-06-19 MED ORDER — ONDANSETRON 4 MG PO TBDP: 4.0000 mg | ORAL_TABLET | Freq: Three times a day (TID) | ORAL | Status: DC | PRN

## 2021-06-19 MED ORDER — PROPOFOL 10 MG/ML IV BOLUS
INTRAVENOUS | Status: AC
  Filled 2021-06-19: qty 20

## 2021-06-19 MED ORDER — LIDOCAINE 2% (20 MG/ML) 5 ML SYRINGE
INTRAMUSCULAR | Status: AC
  Filled 2021-06-19: qty 5

## 2021-06-19 MED ORDER — HYDROMORPHONE HCL 1 MG/ML IJ SOLN
0.2500 mg | INTRAMUSCULAR | Status: DC | PRN
  Administered 2021-06-19 (×3): 0.5 mg via INTRAVENOUS

## 2021-06-19 MED ORDER — HYDROCODONE-ACETAMINOPHEN 7.5-325 MG PO TABS
1.0000 | ORAL_TABLET | Freq: Four times a day (QID) | ORAL | Status: DC
  Administered 2021-06-19: 17:00:00 1 via ORAL
  Filled 2021-06-19: qty 1

## 2021-06-19 MED ORDER — SODIUM CHLORIDE 0.9% FLUSH
3.0000 mL | Freq: Two times a day (BID) | INTRAVENOUS | Status: DC
  Administered 2021-06-19 – 2021-06-20 (×2): 3 mL via INTRAVENOUS

## 2021-06-19 MED ORDER — SODIUM CHLORIDE 0.9% FLUSH: 3.0000 mL | INTRAVENOUS | Status: DC | PRN

## 2021-06-19 MED ORDER — ZOLPIDEM TARTRATE 5 MG PO TABS: 5.0000 mg | ORAL_TABLET | Freq: Every evening | ORAL | Status: DC | PRN

## 2021-06-19 MED ORDER — ALBUTEROL SULFATE (2.5 MG/3ML) 0.083% IN NEBU: 2.5000 mg | INHALATION_SOLUTION | Freq: Four times a day (QID) | RESPIRATORY_TRACT | Status: DC | PRN

## 2021-06-19 MED ORDER — CEFAZOLIN SODIUM-DEXTROSE 2-4 GM/100ML-% IV SOLN
2.0000 g | INTRAVENOUS | Status: AC
  Administered 2021-06-19: 10:00:00 3 g via INTRAVENOUS
  Filled 2021-06-19: qty 100

## 2021-06-19 MED ORDER — ORAL CARE MOUTH RINSE: 15.0000 mL | Freq: Once | OROMUCOSAL | Status: AC

## 2021-06-19 MED ORDER — THROMBIN 20000 UNITS EX KIT
PACK | CUTANEOUS | Status: DC | PRN
  Administered 2021-06-19: 10:00:00 20 mL via TOPICAL

## 2021-06-19 MED ORDER — LEVOTHYROXINE SODIUM 100 MCG PO TABS: 200.0000 ug | ORAL_TABLET | Freq: Every day | ORAL | Status: DC

## 2021-06-19 MED ORDER — SENNOSIDES-DOCUSATE SODIUM 8.6-50 MG PO TABS: 1.0000 | ORAL_TABLET | Freq: Every evening | ORAL | Status: DC | PRN

## 2021-06-19 MED ORDER — LIDOCAINE-EPINEPHRINE 1 %-1:100000 IJ SOLN
INTRAMUSCULAR | Status: DC | PRN
  Administered 2021-06-19: 7 mL

## 2021-06-19 MED ORDER — ONDANSETRON HCL 4 MG/2ML IJ SOLN: 4.0000 mg | Freq: Four times a day (QID) | INTRAMUSCULAR | Status: DC | PRN

## 2021-06-19 MED ORDER — HYDROMORPHONE HCL 1 MG/ML IJ SOLN
1.0000 mg | INTRAMUSCULAR | Status: DC | PRN
  Administered 2021-06-19: 18:00:00 1 mg via INTRAVENOUS
  Filled 2021-06-19: qty 1

## 2021-06-19 MED ORDER — ROCURONIUM BROMIDE 10 MG/ML (PF) SYRINGE
PREFILLED_SYRINGE | INTRAVENOUS | Status: AC
  Filled 2021-06-19: qty 10

## 2021-06-19 MED ORDER — FLUTICASONE FUROATE-VILANTEROL 100-25 MCG/ACT IN AEPB
1.0000 | INHALATION_SPRAY | Freq: Every day | RESPIRATORY_TRACT | Status: DC
  Filled 2021-06-19: qty 28

## 2021-06-19 MED ORDER — OXYCODONE HCL 5 MG PO TABS: 5.0000 mg | ORAL_TABLET | ORAL | Status: DC | PRN

## 2021-06-19 MED ORDER — CHLORHEXIDINE GLUCONATE CLOTH 2 % EX PADS: 6.0000 | MEDICATED_PAD | Freq: Once | CUTANEOUS | Status: DC

## 2021-06-19 MED ORDER — ACETAMINOPHEN 650 MG RE SUPP: 650.0000 mg | RECTAL | Status: DC | PRN

## 2021-06-19 MED ORDER — ASPIRIN EC 81 MG PO TBEC
81.0000 mg | DELAYED_RELEASE_TABLET | Freq: Every day | ORAL | Status: DC
  Administered 2021-06-20: 09:00:00 81 mg via ORAL
  Filled 2021-06-19: qty 1

## 2021-06-19 MED ORDER — FUROSEMIDE 40 MG PO TABS
40.0000 mg | ORAL_TABLET | ORAL | Status: DC
  Filled 2021-06-19: qty 1

## 2021-06-19 MED ORDER — POTASSIUM CHLORIDE IN NACL 20-0.9 MEQ/L-% IV SOLN: INTRAVENOUS | Status: DC

## 2021-06-19 MED ORDER — AMISULPRIDE (ANTIEMETIC) 5 MG/2ML IV SOLN: 10.0000 mg | Freq: Once | INTRAVENOUS | Status: DC | PRN

## 2021-06-19 MED ORDER — ONDANSETRON HCL 4 MG/2ML IJ SOLN
INTRAMUSCULAR | Status: AC
  Filled 2021-06-19: qty 2

## 2021-06-19 MED ORDER — MENTHOL 3 MG MT LOZG
1.0000 | LOZENGE | OROMUCOSAL | Status: DC | PRN
  Filled 2021-06-19: qty 9

## 2021-06-19 MED ORDER — LIDOCAINE 2% (20 MG/ML) 5 ML SYRINGE
INTRAMUSCULAR | Status: DC | PRN
  Administered 2021-06-19: 60 mg via INTRAVENOUS

## 2021-06-19 MED ORDER — SENNA 8.6 MG PO TABS
1.0000 | ORAL_TABLET | Freq: Two times a day (BID) | ORAL | Status: DC
  Administered 2021-06-19 – 2021-06-20 (×2): 8.6 mg via ORAL
  Filled 2021-06-19 (×2): qty 1

## 2021-06-19 MED ORDER — GABAPENTIN ENACARBIL ER 300 MG PO TBCR: 300.0000 mg | EXTENDED_RELEASE_TABLET | Freq: Every day | ORAL | Status: DC

## 2021-06-19 MED ORDER — CLONAZEPAM 0.5 MG PO TABS: 0.5000 mg | ORAL_TABLET | Freq: Two times a day (BID) | ORAL | Status: DC | PRN

## 2021-06-19 MED ORDER — LACTATED RINGERS IV SOLN: INTRAVENOUS | Status: DC | PRN

## 2021-06-19 MED ORDER — INSULIN ASPART 100 UNIT/ML IJ SOLN
INTRAMUSCULAR | Status: DC | PRN
  Administered 2021-06-19: 4 [IU] via SUBCUTANEOUS

## 2021-06-19 MED ORDER — HYDROMORPHONE HCL 1 MG/ML IJ SOLN
INTRAMUSCULAR | Status: AC
  Filled 2021-06-19: qty 1

## 2021-06-19 MED ORDER — FENTANYL CITRATE (PF) 100 MCG/2ML IJ SOLN
INTRAMUSCULAR | Status: DC | PRN
  Administered 2021-06-19 (×2): 50 ug via INTRAVENOUS
  Administered 2021-06-19: 100 ug via INTRAVENOUS

## 2021-06-19 MED ORDER — OXYCODONE HCL 5 MG PO TABS
10.0000 mg | ORAL_TABLET | ORAL | Status: DC | PRN
  Administered 2021-06-19 – 2021-06-20 (×5): 10 mg via ORAL
  Filled 2021-06-19 (×5): qty 2

## 2021-06-19 MED ORDER — HYDROMORPHONE HCL 1 MG/ML IJ SOLN
INTRAMUSCULAR | Status: AC
Start: 2021-06-19 — End: 2021-06-20
  Filled 2021-06-19: qty 1

## 2021-06-19 MED ORDER — DIAZEPAM 5 MG PO TABS
5.0000 mg | ORAL_TABLET | Freq: Four times a day (QID) | ORAL | Status: DC | PRN
  Administered 2021-06-19 – 2021-06-20 (×2): 5 mg via ORAL
  Filled 2021-06-19 (×2): qty 1

## 2021-06-19 MED ORDER — INSULIN ASPART 100 UNIT/ML IJ SOLN
INTRAMUSCULAR | Status: AC
  Filled 2021-06-19: qty 1

## 2021-06-19 MED ORDER — CHLORHEXIDINE GLUCONATE 0.12 % MT SOLN
15.0000 mL | Freq: Once | OROMUCOSAL | Status: AC
  Administered 2021-06-19: 08:00:00 15 mL via OROMUCOSAL
  Filled 2021-06-19: qty 15

## 2021-06-19 MED ORDER — EMPAGLIFLOZIN 10 MG PO TABS
10.0000 mg | ORAL_TABLET | Freq: Every day | ORAL | Status: DC
  Administered 2021-06-19 – 2021-06-20 (×2): 10 mg via ORAL
  Filled 2021-06-19 (×2): qty 1

## 2021-06-19 MED ORDER — ACETAMINOPHEN 500 MG PO TABS
1000.0000 mg | ORAL_TABLET | Freq: Four times a day (QID) | ORAL | Status: AC
  Administered 2021-06-19 – 2021-06-20 (×4): 1000 mg via ORAL
  Filled 2021-06-19 (×4): qty 2

## 2021-06-19 MED ORDER — LEVOTHYROXINE SODIUM 25 MCG PO TABS: 225.0000 ug | ORAL_TABLET | Freq: Every day | ORAL | Status: DC

## 2021-06-19 MED ORDER — NYSTATIN 100000 UNIT/GM EX CREA: 1.0000 "application " | TOPICAL_CREAM | Freq: Two times a day (BID) | CUTANEOUS | Status: DC | PRN

## 2021-06-19 MED ORDER — PRIMIDONE 50 MG PO TABS
150.0000 mg | ORAL_TABLET | Freq: Every day | ORAL | Status: DC
  Administered 2021-06-19: 150 mg via ORAL
  Filled 2021-06-19 (×2): qty 3

## 2021-06-19 MED ORDER — INSULIN ASPART 100 UNIT/ML IJ SOLN
6.0000 [IU] | Freq: Once | INTRAMUSCULAR | Status: AC
  Administered 2021-06-19: 13:00:00 6 [IU] via SUBCUTANEOUS

## 2021-06-19 MED ORDER — SIMVASTATIN 20 MG PO TABS
40.0000 mg | ORAL_TABLET | Freq: Every day | ORAL | Status: DC
  Administered 2021-06-19: 21:00:00 40 mg via ORAL
  Filled 2021-06-19: qty 2

## 2021-06-19 MED ORDER — ACETAMINOPHEN 325 MG PO TABS: 650.0000 mg | ORAL_TABLET | ORAL | Status: DC | PRN

## 2021-06-19 MED ORDER — BISOPROLOL-HYDROCHLOROTHIAZIDE 5-6.25 MG PO TABS: 1.0000 | ORAL_TABLET | Freq: Every day | ORAL | Status: DC

## 2021-06-19 MED ORDER — FENTANYL CITRATE (PF) 100 MCG/2ML IJ SOLN
INTRAMUSCULAR | Status: AC
  Filled 2021-06-19: qty 2

## 2021-06-19 MED ORDER — DEXAMETHASONE SODIUM PHOSPHATE 10 MG/ML IJ SOLN
INTRAMUSCULAR | Status: DC | PRN
  Administered 2021-06-19: 10 mg via INTRAVENOUS

## 2021-06-19 MED ORDER — FENTANYL CITRATE (PF) 100 MCG/2ML IJ SOLN
25.0000 ug | INTRAMUSCULAR | Status: DC | PRN
  Administered 2021-06-19 (×4): 50 ug via INTRAVENOUS

## 2021-06-19 MED ORDER — DICLOFENAC SODIUM 1 % EX GEL: 2.0000 g | Freq: Two times a day (BID) | CUTANEOUS | Status: DC | PRN

## 2021-06-19 MED ORDER — ONDANSETRON HCL 4 MG PO TABS: 4.0000 mg | ORAL_TABLET | Freq: Four times a day (QID) | ORAL | Status: DC | PRN

## 2021-06-19 MED ORDER — SUGAMMADEX SODIUM 500 MG/5ML IV SOLN
INTRAVENOUS | Status: AC
  Filled 2021-06-19: qty 5

## 2021-06-19 MED ORDER — IRBESARTAN 150 MG PO TABS
75.0000 mg | ORAL_TABLET | Freq: Every day | ORAL | Status: DC
  Administered 2021-06-19: 21:00:00 75 mg via ORAL
  Filled 2021-06-19: qty 1

## 2021-06-19 MED ORDER — ADULT MULTIVITAMIN W/MINERALS CH
1.0000 | ORAL_TABLET | Freq: Every day | ORAL | Status: DC
  Administered 2021-06-20: 09:00:00 1 via ORAL
  Filled 2021-06-19: qty 1

## 2021-06-19 MED ORDER — DEXAMETHASONE SODIUM PHOSPHATE 10 MG/ML IJ SOLN
INTRAMUSCULAR | Status: AC
  Filled 2021-06-19: qty 1

## 2021-06-19 MED ORDER — SUGAMMADEX SODIUM 200 MG/2ML IV SOLN
INTRAVENOUS | Status: DC | PRN
  Administered 2021-06-19: 300 mg via INTRAVENOUS

## 2021-06-19 MED ORDER — 0.9 % SODIUM CHLORIDE (POUR BTL) OPTIME
TOPICAL | Status: DC | PRN
  Administered 2021-06-19: 10:00:00 1000 mL

## 2021-06-19 MED ORDER — FENTANYL CITRATE (PF) 100 MCG/2ML IJ SOLN
INTRAMUSCULAR | Status: AC
Start: 2021-06-19 — End: 2021-06-20
  Filled 2021-06-19: qty 2

## 2021-06-19 MED ORDER — THROMBIN 20000 UNITS EX SOLR
CUTANEOUS | Status: AC
  Filled 2021-06-19: qty 20000

## 2021-06-19 MED ORDER — ALBUTEROL SULFATE (2.5 MG/3ML) 0.083% IN NEBU: 3.0000 mL | INHALATION_SOLUTION | Freq: Four times a day (QID) | RESPIRATORY_TRACT | Status: DC | PRN

## 2021-06-19 MED ORDER — PHENOL 1.4 % MT LIQD
1.0000 | OROMUCOSAL | Status: DC | PRN
  Administered 2021-06-20: 08:00:00 1 via OROMUCOSAL
  Filled 2021-06-19: qty 177

## 2021-06-19 MED ORDER — BISOPROLOL-HYDROCHLOROTHIAZIDE 5-6.25 MG PO TABS
1.0000 | ORAL_TABLET | Freq: Every day | ORAL | Status: DC
Start: 2021-06-19 — End: 2021-06-20
  Administered 2021-06-20: 09:00:00 1 via ORAL
  Filled 2021-06-19 (×2): qty 1

## 2021-06-19 MED ORDER — ACETAMINOPHEN 500 MG PO TABS
1000.0000 mg | ORAL_TABLET | Freq: Once | ORAL | Status: AC
  Administered 2021-06-19: 08:00:00 1000 mg via ORAL
  Filled 2021-06-19: qty 2

## 2021-06-19 MED ORDER — MIDAZOLAM HCL 2 MG/2ML IJ SOLN
INTRAMUSCULAR | Status: AC
  Filled 2021-06-19: qty 2

## 2021-06-19 MED ORDER — SODIUM CHLORIDE 0.9 % IV SOLN
250.0000 mL | INTRAVENOUS | Status: DC
  Administered 2021-06-19: 16:00:00 250 mL via INTRAVENOUS

## 2021-06-19 MED ORDER — LIDOCAINE-EPINEPHRINE 1 %-1:100000 IJ SOLN
INTRAMUSCULAR | Status: AC
  Filled 2021-06-19: qty 1

## 2021-06-19 MED ORDER — FENTANYL CITRATE (PF) 250 MCG/5ML IJ SOLN
INTRAMUSCULAR | Status: AC
  Filled 2021-06-19: qty 5

## 2021-06-19 MED ORDER — MIDAZOLAM HCL 5 MG/5ML IJ SOLN
INTRAMUSCULAR | Status: DC | PRN
  Administered 2021-06-19: 2 mg via INTRAVENOUS

## 2021-06-19 MED ORDER — KETAMINE HCL 50 MG/5ML IJ SOSY
PREFILLED_SYRINGE | INTRAMUSCULAR | Status: AC
  Filled 2021-06-19: qty 5

## 2021-06-19 SURGICAL SUPPLY — 61 items
ADH SKN CLS APL DERMABOND .7 (GAUZE/BANDAGES/DRESSINGS) ×1
BAG COUNTER SPONGE SURGICOUNT (BAG) ×3 IMPLANT
BAG SPNG CNTER NS LX DISP (BAG) ×2
BAG SURGICOUNT SPONGE COUNTING (BAG) ×2
BAND INSRT 18 STRL LF DISP RB (MISCELLANEOUS) ×2
BAND RUBBER #18 3X1/16 STRL (MISCELLANEOUS) ×6 IMPLANT
BLADE CLIPPER SURG (BLADE) IMPLANT
BUR DRUM 4.0 (BURR) ×2 IMPLANT
BUR DRUM 4.0MM (BURR) ×1
BUR MATCHSTICK NEURO 3.0 LAGG (BURR) ×3 IMPLANT
BUR ROUND FLUTED 4 SOFT TCH (BURR) ×1 IMPLANT
BUR ROUND FLUTED 4MM SOFT TCH (BURR) ×1
CAGE PEEK SANTORINI 14X12X25 (Cage) ×2 IMPLANT
CANISTER SUCT 3000ML PPV (MISCELLANEOUS) ×3 IMPLANT
CARTRIDGE OIL MAESTRO DRILL (MISCELLANEOUS) ×1 IMPLANT
DECANTER SPIKE VIAL GLASS SM (MISCELLANEOUS) ×1 IMPLANT
DERMABOND ADVANCED (GAUZE/BANDAGES/DRESSINGS) ×2
DERMABOND ADVANCED .7 DNX12 (GAUZE/BANDAGES/DRESSINGS) ×1 IMPLANT
DIFFUSER DRILL AIR PNEUMATIC (MISCELLANEOUS) ×3 IMPLANT
DRAPE C-ARM 42X72 X-RAY (DRAPES) ×4 IMPLANT
DRAPE HALF SHEET 40X57 (DRAPES) IMPLANT
DRAPE LAPAROTOMY 100X72 PEDS (DRAPES) ×3 IMPLANT
DRAPE MICROSCOPE LEICA (MISCELLANEOUS) ×3 IMPLANT
DURAPREP 6ML APPLICATOR 50/CS (WOUND CARE) ×3 IMPLANT
ELECT COATED BLADE 2.86 ST (ELECTRODE) ×3 IMPLANT
ELECT REM PT RETURN 9FT ADLT (ELECTROSURGICAL) ×3
ELECTRODE REM PT RTRN 9FT ADLT (ELECTROSURGICAL) ×1 IMPLANT
GAUZE 4X4 16PLY ~~LOC~~+RFID DBL (SPONGE) ×2 IMPLANT
GLOVE EXAM NITRILE XL STR (GLOVE) IMPLANT
GLOVE SURG LTX SZ6.5 (GLOVE) ×3 IMPLANT
GOWN STRL REUS W/ TWL LRG LVL3 (GOWN DISPOSABLE) ×2 IMPLANT
GOWN STRL REUS W/ TWL XL LVL3 (GOWN DISPOSABLE) IMPLANT
GOWN STRL REUS W/TWL 2XL LVL3 (GOWN DISPOSABLE) IMPLANT
GOWN STRL REUS W/TWL LRG LVL3 (GOWN DISPOSABLE) ×6
GOWN STRL REUS W/TWL XL LVL3 (GOWN DISPOSABLE)
HALTER HD/CHIN CERV TRACTION D (MISCELLANEOUS) IMPLANT
HEMOSTAT SURGICEL 2X14 (HEMOSTASIS) IMPLANT
KIT BASIN OR (CUSTOM PROCEDURE TRAY) ×3 IMPLANT
KIT TURNOVER KIT B (KITS) ×3 IMPLANT
NDL HYPO 25X1 1.5 SAFETY (NEEDLE) ×1 IMPLANT
NDL SPNL 22GX3.5 QUINCKE BK (NEEDLE) ×1 IMPLANT
NEEDLE HYPO 25X1 1.5 SAFETY (NEEDLE) ×3 IMPLANT
NEEDLE SPNL 22GX3.5 QUINCKE BK (NEEDLE) ×3 IMPLANT
NS IRRIG 1000ML POUR BTL (IV SOLUTION) ×3 IMPLANT
OIL CARTRIDGE MAESTRO DRILL (MISCELLANEOUS) ×3
PACK LAMINECTOMY NEURO (CUSTOM PROCEDURE TRAY) ×3 IMPLANT
PAD ARMBOARD 7.5X6 YLW CONV (MISCELLANEOUS) ×3 IMPLANT
PIN DISTRACTION 14MM (PIN) ×4 IMPLANT
PLATE ACP 1.6X40 2LVL (Plate) ×2 IMPLANT
SCREW ACP ST VARI 3.5X13 (Screw) ×8 IMPLANT
SCREW ACP VA ST 4X13 (Screw) ×2 IMPLANT
SPONGE INTESTINAL PEANUT (DISPOSABLE) ×3 IMPLANT
SPONGE SURGIFOAM ABS GEL 100 (HEMOSTASIS) ×2 IMPLANT
SPONGE T-LAP 4X18 ~~LOC~~+RFID (SPONGE) ×2 IMPLANT
SUT VIC AB 0 CT1 27 (SUTURE)
SUT VIC AB 0 CT1 27XBRD ANTBC (SUTURE) IMPLANT
SUT VIC AB 3-0 SH 8-18 (SUTURE) ×5 IMPLANT
TOWEL GREEN STERILE (TOWEL DISPOSABLE) ×3 IMPLANT
TOWEL GREEN STERILE FF (TOWEL DISPOSABLE) ×3 IMPLANT
TRAY FOLEY MTR SLVR 16FR STAT (SET/KITS/TRAYS/PACK) IMPLANT
WATER STERILE IRR 1000ML POUR (IV SOLUTION) ×3 IMPLANT

## 2021-06-19 NOTE — Op Note (Signed)
06/19/2021  1:00 PM  PATIENT:  Autumn Massey  55 y.o. female with a large hnp which has migrated behind the body of C6, and appears to have originated at C6/7. I believe a C6 corpectomy will be needed to safely remove the disc herniation  PRE-OPERATIVE DIAGNOSIS:  Herniated nucleus pulposus, Cervical 6/7  POST-OPERATIVE DIAGNOSIS:  Herniated nucleus pulposus, Cervical C6/7  PROCEDURE:  C6 corpectomy , microscopic dissection Arthrodesis C5-7 with 14mm peek cage filled with autograft morsels Anterior instrumentation(Nuvasive) C5-7  SURGEON:   Surgeon(s): Ashok Pall, MD Kary Kos, MD   ASSISTANTS:Cram, Dominica Severin  ANESTHESIA:   general  EBL:  Total I/O In: 1900 [I.V.:1900] Out: 100 [Blood:100]  BLOOD ADMINISTERED:none  CELL SAVER GIVEN:none  COUNT:per nursing  DRAINS: none   SPECIMEN:  No Specimen  DICTATION: Mrs. Peale was taken to the operating room, intubated, and placed under general anesthesia without difficulty. She was positioned supine with her head in slight extension on a horseshoe headrest. A chin strap was used for traction with 5lbs attached. The neck was prepped and draped in a sterile manner. I infiltrated 7 cc's 1/2%lidocaine/1:200,000 strength epinephrine into the planned incision starting from the midline to the medial border of the left sternocleidomastoid muscle. I opened the incision with a 10 blade and dissected sharply through soft tissue to the platysma. I dissected in the plane superior to the platysma both rostrally and caudally. I then opened the platysma in a horizontal fashion with Metzenbaum scissors, and dissected in the inferior plane rostrally and caudally. With both blunt and sharp technique I created an avascular corridor to the cervical spine. I placed a spinal needle(s) in the disc space at 6/7 . I then reflected the longus colli from C5 to C7 and placed self retaining retractors. I opened the disc space(s) at C5/6,6/7 with a 15 blade. I  removed disc with curettes, Kerrison punches, and the drill. Using the drill I removed osteophytes and prepared for the decompression.  I decompressed the spinal canal and the C6,7 root(s) with the drill, Kerrison punches, and the curettes. I used the microscope to aid in microdissection. I removed the posterior longitudinal ligament to fully expose and decompress the thecal sac. I exposed the roots laterally taking down the 5/6,6/7 uncovertebral joints. With the decompression complete We moved on to the arthrodesis. I used the drill to level the surfaces of C5, C7. I removed soft tissue to prepare the space and the bony surfaces. I measured the space and placed a 54mm Peek cage packed with autograft morsels. I then placed the anterior instrumentation. I placed 2 screws in each vertebral body through the plate. I locked the screws into place. Intraoperative xray showed the graft, plate, and screws to be in good position. I irrigated the wound, achieved hemostasis, and closed the wound in layers. I approximated the platysma, and the subcuticular plane with vicryl sutures. I used Dermabond for a sterile dressing.   PLAN OF CARE: Admit to inpatient   PATIENT DISPOSITION:  PACU - hemodynamically stable.   Delay start of Pharmacological VTE agent (>24hrs) due to surgical blood loss or risk of bleeding:  no

## 2021-06-19 NOTE — H&P (Signed)
BP (!) 141/52 (BP Location: Right Arm)    Pulse 73    Temp 98.5 F (36.9 C) (Oral)    Resp 18    Ht 5\' 2"  (1.575 m)    Wt (!) 139.3 kg    SpO2 91%    BMI 56.15 kg/m  Autumn Massey comes in today for evaluation of pain that she has in the upper extremities, especially in her hands.  It is a severe intense neuropathic type of description that she gives.  She says it has been going on for the last 3-4 months.  When I asked how bad it was and how long it had been going on, she just said "I cannot keep going the way that I have been".  She is currently disabled.  In her description of why she is being seen, she says pain in back of neck which runs across shoulders and down arms into hands, hand/ fingers feel like they are sleepy, tingling and painful at times to touch.  She is not sure how her symptoms began.  She is 5 feet 2 inches tall.  She has had a tonsillectomy in the past and ulnar nerve surgery in the left upper extremity in 2014.  She has been hospitalized for pneumonia in November of 2016.     MEDICATIONS :  Include Simvastatin, Fluoxetine, Montelukast, Primidone, Horizant,  Levothyroxine, Metformin, Spironolactone, Lasix, Irbesartan, Bisoprolol, Hydrochlorothiazide, Albuterol, Breo Ellipta, Acetaminophen, Oxygen 2 L at bedtime only, Baby Aspirin, Vitamin D3, Pennsaid solution p.r.n., Cyclobenzaprine, Clonazepam, Ondansetron, Hydrocodone.     ALLERGIES :  She has a reaction to Duloxetine. and she said it was other.     REVIEW OF SYSTEMS :  Positive for excessive hair growth, general weakness, seasonal allergies, joint pain, muscle weakness, anxiety, fatigue, hair loss, arm and leg swelling, shortness of breath, arm and leg pain, numbness, tingling, depression, tremors, neck pain, and hearing loss.     PAST MEDICAL HISTORY :  Significant for arthritis, asthma, hypertension, thyroid disease, and diabetes.     There is a history of skin cancer on the foot, and colon cancer.        She feels that her arms are weak.  She cannot sit without pain.  The pain is constant.  No bowel or bladder difficulty.     SOCIAL HISTORY :  She is divorced.  She does not live alone.  She does not have children. She is right handed.  She does not use alcohol.  She has smoked in the past.  She quit in 2006, and she is not pregnant.     PHYSICAL EXAMINATION :  On exam, she has good strength in the upper extremities.  Proprioception is intact.  Normal muscle tone, bulk, and coordination.  Reflexes 2+ biceps, triceps, brachioradialis, not elicited at the knees or ankles, but that is also due to the difficulty of finding the spot secondary to body habitus.  Pupils equal, round, react to light.  Full extraocular movements.  Hearing is decreased to voice.     IMAGING :  MRI shows what appears to be a very large disc herniation that has migrated either rostrally from C6-7 behind the entire body of C6 or it is a component of both from C5-6 and C6-7 that simply meet in the middle, but there is a great deal of pressure put on the spinal cord.  The radiologist did not read it as altered signal.  I think there is a little bit of altered  signal there.     I don't believe that a 2-level ACD would be sufficient because the disc material is directly behind the body of C6.  I therefore believe she needs a corpectomy of C6 and a strut along with plating and screws, and I explained this to her.  We will try to get this done sometime after Thanksgiving, but before Christmas.  I gave her a detailed instruction sheet with regard to the operation.

## 2021-06-19 NOTE — Transfer of Care (Signed)
Immediate Anesthesia Transfer of Care Note  Patient: Autumn Massey  Procedure(s) Performed: Cerivcal Six Corpectomy  Patient Location: PACU  Anesthesia Type:General  Level of Consciousness: drowsy and patient cooperative  Airway & Oxygen Therapy: Patient Spontanous Breathing and Patient connected to face mask oxygen  Post-op Assessment: Report given to RN and Post -op Vital signs reviewed and stable  Post vital signs: Reviewed and stable  Last Vitals:  Vitals Value Taken Time  BP 155/72 06/19/21 1238  Temp    Pulse 81 06/19/21 1240  Resp 22 06/19/21 1240  SpO2 95 % 06/19/21 1240  Vitals shown include unvalidated device data.  Last Pain:  Vitals:   06/19/21 0721  TempSrc:   PainSc: 5       Patients Stated Pain Goal: 2 (44/61/90 1222)  Complications: No notable events documented.

## 2021-06-19 NOTE — Anesthesia Procedure Notes (Signed)
Procedure Name: Intubation Date/Time: 06/19/2021 9:20 AM Performed by: Georgia Duff, CRNA Pre-anesthesia Checklist: Patient identified, Emergency Drugs available, Suction available and Patient being monitored Patient Re-evaluated:Patient Re-evaluated prior to induction Oxygen Delivery Method: Circle System Utilized Preoxygenation: Pre-oxygenation with 100% oxygen Induction Type: IV induction Ventilation: Mask ventilation without difficulty and Oral airway inserted - appropriate to patient size Laryngoscope Size: Sabra Heck and 2 Grade View: Grade I Tube type: Oral Tube size: 7.0 mm Number of attempts: 1 Airway Equipment and Method: Stylet and Oral airway Placement Confirmation: ETT inserted through vocal cords under direct vision, positive ETCO2 and breath sounds checked- equal and bilateral Secured at: 21 cm Tube secured with: Tape Dental Injury: Teeth and Oropharynx as per pre-operative assessment

## 2021-06-20 ENCOUNTER — Encounter: Payer: Self-pay | Admitting: Internal Medicine

## 2021-06-20 ENCOUNTER — Encounter (HOSPITAL_COMMUNITY): Payer: Self-pay | Admitting: Neurosurgery

## 2021-06-20 DIAGNOSIS — E1165 Type 2 diabetes mellitus with hyperglycemia: Secondary | ICD-10-CM

## 2021-06-20 LAB — GLUCOSE, CAPILLARY
Glucose-Capillary: 179 mg/dL — ABNORMAL HIGH (ref 70–99)
Glucose-Capillary: 167 mg/dL — ABNORMAL HIGH (ref 70–99)

## 2021-06-20 MED ORDER — OXYCODONE HCL 5 MG PO TABS: 5.0000 mg | ORAL_TABLET | Freq: Four times a day (QID) | ORAL | 0 refills | Status: AC | PRN

## 2021-06-20 MED ORDER — LEVOTHYROXINE SODIUM 75 MCG PO TABS
225.0000 ug | ORAL_TABLET | Freq: Every day | ORAL | Status: DC
  Administered 2021-06-20: 05:00:00 225 ug via ORAL
  Filled 2021-06-20: qty 3

## 2021-06-20 MED FILL — Thrombin For Soln 20000 Unit: CUTANEOUS | Qty: 1 | Status: AC

## 2021-06-20 NOTE — Discharge Summary (Signed)
Physician Discharge Summary  Patient ID: Autumn Massey MRN: [id number] DOB/AGE: December 30, 1965 55 y.o.  Admit date: 06/19/2021 Discharge date: 06/20/2021  Admission Diagnoses:Cervical stenosis Cervical HNP C6/7  Discharge Diagnoses:  Principal Problem:   HNP (herniated nucleus pulposus), cervical Active Problems:   Cervical disc herniation   Discharged Condition: good  Hospital Course: [name] was admitted and taken to the operating room where she underwent a C6 corpectomy and arthrodesis from C5-7. Post op she is voiding, ambulating and tolerating a regular diet. Voice is strong Wound is clean, dry, and without signs of infection.   Treatments: surgery: C6 corpectomy, anterior plating C5-7  Discharge Exam: Blood pressure (!) 125/56, pulse 60, temperature 97.8 F (36.6 C), resp. rate 18, height _0  (1.575 m), weight (!) 139.3 kg, SpO2 97 %. General appearance: alert, cooperative, appears stated age, and no distress  Disposition: Discharge disposition: 01-Home or Self Care      Herniated nucleus pulposus, Cervical  Allergies as of 06/20/2021       Reactions   Cymbalta [duloxetine Hcl]    Altered mental state / agitated         Medication List     TAKE these medications    Accu-Chek Aviva Plus w/Device Kit Use to check blood sugar once a day   Accu-Chek [employer] Kit Use to check blood sugar once a day   Accu-Chek FastClix Lancets Misc Use to check blood sugar once a day   albuterol (2.5 MG/3ML) 0.083% nebulizer solution Commonly known as: PROVENTIL Take 3 mLs (2.5 mg total) by nebulization every 6 (six) hours as needed for wheezing or shortness of breath.   albuterol 108 (90 Base) MCG/ACT inhaler Commonly known as: ProAir HFA 2 puffs every 6 hours as needed   aspirin EC 81 MG tablet Take 81 mg by mouth daily.   bisoprolol-hydrochlorothiazide 5-6.25 MG tablet Commonly known as: ZIAC TAKE 1 TABLET BY MOUTH  DAILY   Breo Ellipta  100-25 MCG/ACT Aepb Generic drug: fluticasone furoate-vilanterol INHALE 1 PUFF INTO THE LUNGS EVERY MORNING.   clonazePAM 0.5 MG tablet Commonly known as: KLONOPIN Take 1 tablet (0.5 mg total) by mouth 2 (two) times daily as needed for anxiety.   cyclobenzaprine 5 MG tablet Commonly known as: FLEXERIL TAKE 1 TABLET BY MOUTH 3  TIMES DAILY AS NEEDED FOR  MUSCLE SPASM(S) What changed: additional instructions   empagliflozin 10 MG Tabs tablet Commonly known as: Jardiance Take 1 tablet (10 mg total) by mouth daily.   FLUoxetine 40 MG capsule Commonly known as: PROZAC TAKE 1 CAPSULE BY MOUTH  DAILY   furosemide 40 MG tablet Commonly known as: LASIX TAKE 1 TABLET BY MOUTH DAILY What changed:  how much to take how to take this when to take this additional instructions   glucose blood test strip Commonly known as: Accu-Chek Aviva Use to check blood sugar once a day - Aviva Plus   Horizant 300 MG Tbcr Generic drug: Gabapentin Enacarbil ER Take 300 mg by mouth at bedtime.   HYDROcodone-acetaminophen 5-325 MG tablet Commonly known as: NORCO/VICODIN Take 1 tablet by mouth every 6 (six) hours as needed for severe pain.   irbesartan 75 MG tablet Commonly known as: AVAPRO TAKE 1 TABLET BY MOUTH AT  BEDTIME   levothyroxine 25 MCG tablet Commonly known as: SYNTHROID TAKE 1 TABLET BY MOUTH EVERY DAY What changed:  when to take this additional instructions   levothyroxine 200 MCG tablet Commonly known as: SYNTHROID TAKE 1 TABLET BY  MOUTH EVERY DAY What changed:  how much to take how to take this when to take this additional instructions   metFORMIN 500 MG 24 hr tablet Commonly known as: GLUCOPHAGE-XR Take 1 tablet (500 mg total) by mouth daily with breakfast.   montelukast 10 MG tablet Commonly known as: SINGULAIR TAKE 1 TABLET BY MOUTH AT  BEDTIME   multivitamin with minerals Tabs tablet Take 1 tablet by mouth daily.   nystatin cream Commonly known as:  MYCOSTATIN Apply 1 application topically 2 (two) times daily. What changed:  when to take this reasons to take this   ondansetron 4 MG disintegrating tablet Commonly known as: Zofran ODT Take 1 tablet (4 mg total) by mouth every 8 (eight) hours as needed for nausea or vomiting.   oxyCODONE 5 MG immediate release tablet Commonly known as: Oxy IR/ROXICODONE Take 1 tablet (5 mg total) by mouth every 6 (six) hours as needed for up to 8 days for moderate pain ((score 4 to 6)).   OXYGEN 2lpm with sleep only  [facility]   Ozempic (0.25 or 0.5 MG/DOSE) 2 MG/1.5ML Sopn Generic drug: Semaglutide(0.25 or 0.5MG/DOS) Inject 0.5 mg into the skin once a week.   Pennsaid 2 % Soln Generic drug: Diclofenac Sodium Place 2 g onto the skin 2 (two) times daily. What changed:  when to take this reasons to take this   primidone 50 MG tablet Commonly known as: MYSOLINE Take 1 tablet in morning and 3 tablets at bedtime What changed:  how much to take how to take this when to take this additional instructions   simvastatin 40 MG tablet Commonly known as: ZOCOR TAKE 1 TABLET BY MOUTH AT  BEDTIME   spironolactone 25 MG tablet Commonly known as: ALDACTONE Take 1 tablet (25 mg total) by mouth 2 (two) times daily.   Vitamin D3 75 MCG (3000 UT) Tabs Take 3,000 Units by mouth daily.        Follow-up Information     Ashok Pall, MD Follow up in 3 week(s).   Specialty: Neurosurgery Why: please call to make an appointment Contact information: [address] [place] [address] [phone]                 Signed: Ashok Pall 06/20/2021, 3:12 PM

## 2021-06-20 NOTE — Evaluation (Addendum)
Physical Therapy Evaluation  Patient Details Name: Autumn Massey MRN: [id number] DOB: 1965/09/03 Today's Date: 06/20/2021  History of Present Illness  Pt is a 55 y/o female who presents s/p C5-C7 anterior cervical corpectomy on 06/19/2021. PMH significant for hearing impairment, CHF, DM, HTN, hypothyroidism, neuropathy, venous insufficiency.  Clinical Impression  Pt admitted with above diagnosis. At the time of PT eval, pt was able to demonstrate transfers and ambulation with up to min assist for balance support and safety. Pt was educated on precautions, positioning recommendations, appropriate activity progression, and car transfer. Pt currently with functional limitations due to the deficits listed below (see PT Problem List). Pt will benefit from skilled PT to increase their independence and safety with mobility to allow discharge to the venue listed below.         Recommendations for follow up therapy are one component of a multi-disciplinary discharge planning process, led by the attending physician.  Recommendations may be updated based on patient status, additional functional criteria and insurance authorization.  Follow Up Recommendations No PT follow up    Assistance Recommended at Discharge PRN  Functional Status Assessment Patient has had a recent decline in their functional status and demonstrates the ability to make significant improvements in function in a reasonable and predictable amount of time.  Equipment Recommendations  None recommended by PT    Recommendations for Other Services       Precautions / Restrictions Precautions Precautions: Fall;Cervical Precaution Booklet Issued: Yes (comment) Precaution Comments: Reviewed handout and pt was cued for precautions during functional mobility. Required Braces or Orthoses:  (No brace needed order) Restrictions Weight Bearing Restrictions: No      Mobility  Bed Mobility Overal bed mobility: Needs Assistance Bed  Mobility: Rolling;Sidelying to Sit Rolling: Supervision Sidelying to sit: Supervision       General bed mobility comments: Use of rails but no assist required. Supervision provided for optimal maintenance of precautions.    Transfers Overall transfer level: Needs assistance Equipment used: None Transfers: Sit to/from Stand Sit to Stand: Supervision           General transfer comment: VC's for improved posture and wide BOS with sit<>stand.    Ambulation/Gait Ambulation/Gait assistance: Min guard;Min assist Gait Distance (Feet): 400 Feet Assistive device: 1 person hand held assist Gait Pattern/deviations: Step-through pattern;Decreased stride length;Trunk flexed Gait velocity: Decreased Gait velocity interpretation: 1.31 - 2.62 ft/sec, indicative of limited community ambulator   General Gait Details: HHA provided for balance support and safety. Pt fluctuated between min guard assist and min assist. No overt LOB noted.  Stairs Stairs: Yes Stairs assistance: Min guard Stair Management: Two rails;Step to pattern;Forwards Number of Stairs: 43 General stair comments: VC's for sequencing and general safety.  Wheelchair Mobility    Modified Rankin (Stroke Patients Only)       Balance Overall balance assessment: Needs assistance Sitting-balance support: Feet supported;No upper extremity supported Sitting balance-Leahy Scale: Fair     Standing balance support: Single extremity supported;During functional activity Standing balance-Leahy Scale: Poor Standing balance comment: Occasional assist required.                             Pertinent Vitals/Pain Pain Assessment: 0-10 Pain Score: 2  Pain Location: incision site Pain Descriptors / Indicators: Operative site guarding Pain Intervention(s): Monitored during session;Limited activity within patient's tolerance;Repositioned    Home Living Family/patient expects to be discharged to:: Private  residence Living Arrangements: Spouse/significant other  Available Help at Discharge: Family Type of Home: House Home Access: Stairs to enter Entrance Stairs-Rails: Right;Left;Can reach both Entrance Stairs-Number of Steps: 4   Home Layout: One level Home Equipment: Conservation officer, nature (2 wheels);Shower seat;Shower seat - built in;Hand held shower head Additional Comments: Pt plans to d/c to sister's house for a week and then go home. Above information is for sister's house.    Prior Function Prior Level of Function : Independent/Modified Independent                     Hand Dominance        Extremity/Trunk Assessment   Upper Extremity Assessment Upper Extremity Assessment: Generalized weakness    Lower Extremity Assessment Lower Extremity Assessment: Generalized weakness    Cervical / Trunk Assessment Cervical / Trunk Assessment: Neck Surgery  Communication   Communication: HOH  Cognition Arousal/Alertness: Awake/alert Behavior During Therapy: WFL for tasks assessed/performed Overall Cognitive Status: Within Functional Limits for tasks assessed                                          General Comments      Exercises     Assessment/Plan    PT Assessment Patient needs continued PT services  PT Problem List Decreased strength;Decreased activity tolerance;Decreased balance;Decreased mobility;Decreased knowledge of use of DME;Decreased safety awareness;Decreased knowledge of precautions;Pain       PT Treatment Interventions DME instruction;Gait training;Stair training;Functional mobility training;Therapeutic activities;Therapeutic exercise;Neuromuscular re-education;Patient/family education    PT Goals (Current goals can be found in the Care Plan section)  Acute Rehab PT Goals Patient Stated Goal: Go to sister's at d/c PT Goal Formulation: With patient Time For Goal Achievement: 06/27/21 Potential to Achieve Goals: Good    Frequency Min  5X/week   Barriers to discharge        Co-evaluation               AM-PAC PT "6 Clicks" Mobility  Outcome Measure Help needed turning from your back to your side while in a flat bed without using bedrails?: A Little Help needed moving from lying on your back to sitting on the side of a flat bed without using bedrails?: A Little Help needed moving to and from a bed to a chair (including a wheelchair)?: A Little Help needed standing up from a chair using your arms (e.g., wheelchair or bedside chair)?: A Little Help needed to walk in hospital room?: A Little Help needed climbing 3-5 steps with a railing? : A Little 6 Click Score: 18    End of Session Equipment Utilized During Treatment: Gait belt Activity Tolerance: Patient tolerated treatment well Patient left: in chair;with call bell/phone within reach Nurse Communication: Mobility status PT Visit Diagnosis: Unsteadiness on feet (R26.81);Pain Pain - part of body:  (neck)    Time: 6734-1937 PT Time Calculation (min) (ACUTE ONLY): 19 min   Charges:   PT Evaluation $PT Eval Low Complexity: 1 Low          Rolinda Roan, PT, DPT Acute Rehabilitation Services Pager: [phone] Office: [phone]   Thelma Comp 06/20/2021, 10:16 AM

## 2021-06-20 NOTE — Progress Notes (Signed)
Inpatient Diabetes Program Recommendations  AACE/ADA: New Consensus Statement on Inpatient Glycemic Control (2015)  Target Ranges:  Prepandial:   less than 140 mg/dL      Peak postprandial:   less than 180 mg/dL (1-2 hours)      Critically ill patients:  140 - 180 mg/dL   Lab Results  Component Value Date   GLUCAP 179 (H) 06/20/2021   HGBA1C 8.4 (H) 06/17/2021    Review of Glycemic Control  Latest Reference Range & Units 06/19/21 16:17 06/19/21 21:53 06/20/21 06:14  Glucose-Capillary 70 - 99 mg/dL 245 (H) 171 (H) 179 (H)  (H): Data is abnormally high Diabetes history: Type 2 DM Outpatient Diabetes medications: Jardiance 10 mg QD, Metformin 500 mg QAM, Semaglutide 0.5 mg qwk Current orders for Inpatient glycemic control: Jardiance 10 mg QD, Metformin 500 mg QAM, Semaglutide 0.5 mg qwk  Inpatient Diabetes Program Recommendations:    Consider adding Novolog 0-15 units TID & Hs.  Thanks, Bronson Curb, MSN, RNC-OB Diabetes Coordinator [phone] (8a-5p)

## 2021-06-20 NOTE — Anesthesia Postprocedure Evaluation (Signed)
Anesthesia Post Note  Patient: Autumn Massey  Procedure(s) Performed: Cerivcal Six Corpectomy     Patient location during evaluation: PACU Anesthesia Type: General Level of consciousness: awake and alert Pain management: pain level controlled Vital Signs Assessment: post-procedure vital signs reviewed and stable Respiratory status: spontaneous breathing, nonlabored ventilation, respiratory function stable and patient connected to nasal cannula oxygen Cardiovascular status: blood pressure returned to baseline and stable Postop Assessment: no apparent nausea or vomiting Anesthetic complications: no   No notable events documented.  Last Vitals:  Vitals:   06/20/21 0333 06/20/21 0726  BP: (!) 138/52 92/64  Pulse: 67 67  Resp: 18 18  Temp: 36.9 C 36.7 C  SpO2: 94% 95%    Last Pain:  Vitals:   06/20/21 0726  TempSrc: Oral  PainSc:                  [name]

## 2021-06-20 NOTE — Discharge Instructions (Addendum)
Wound Care Leave incision open to air. You may shower. Do not scrub directly on incision.  Do not put any creams, lotions, or ointments on incision. Activity Walk each and every day, increasing distance each day. No lifting greater than 5 lbs.  Avoid excessive neck motion. No driving for 2 weeks; may ride as a passenger locally.  Diet Resume your normal diet.   Call Your Doctor If Any of These Occur Redness, drainage, or swelling at the wound.  Temperature greater than 101 degrees. Severe pain not relieved by pain medication. Increased difficulty swallowing. Incision starts to come apart. Follow Up Appt Call today for appointment in 3-4 weeks ([phone]) or for problems.  If you have any hardware placed in your spine, you will need an x-ray before your appointment.         Anterior Cervical Fusion Care After Pinching of the nerves is a common cause of long-term pain. When this happens, a procedure called an anterior cervical fusion is sometimes performed. It relieves the pressure on the pinched nerve roots or spinal cord in the neck. An anterior cervical fusion means that the operation is done through the front (anterior) of your neck to fuse bones in your neck together. This procedure is done to relieve the pressure on pinched nerve roots or spinal cord. This operation is done to control the movement of your spine, which may be pressing on the nerves. This may relieve the pain. The procedure that stops the movement of the spine is called a fusion. The cut by the surgeon (incision) is usually within a skin fold line under your chin. After moving the neck muscles gently apart, the neurosurgeon uses an operating microscope and removes the injured intervertebral disk (the cushion or pad of tissue between the bones of the spine). This takes the pressure off the nerves or spinal cord. This is called decompression. The area where the disc was removed is then filled with a bone graft. The  graft will fuse the vertebrae together over time. This means it causes the vertebral bodies to grow together. The bone graft may be obtained from your own bone (your hip for example), or may be obtained from a bone bank. Receiving bone from a bone bank is similar to a blood bank, only the bone comes from human donors who have recently died. This type of graft is referred to as allograft bone. The preformed bone plug is safe and will not be rejected by your body. It does not contain blood cells. In some cases, the surgeon may use hardware in your neck to help stabilize it. This means that metal plates or pins or screws may be used to: Provide extra support to the neck.  Help the bones to grow together more easily.  A cervical fusion procedure takes a couple hours to several hours, depending on what needs to be done. Your caregiver will be able to answer your questions for you. HOME CARE INSTRUCTIONS  It will be normal to have a sore throat and have difficulty swallowing foods for a couple weeks following surgery. See your caregiver if this seems to be getting worse rather than better.  You may resume normal diet and activities as directed or allowed. Generally, walking and stair climbing are fine. Avoid lifting more than ten pounds and do no lifting above your head.  If given a cervical collar, remove only for bathing and eating, or as directed.  Use only showers for cleaning up, with no bathing, until  seen.  You may apply ice to the surgical or bone donor site for 15 to 20 minutes each hour while awake for the first couple days following surgery. Put the ice in a plastic bag and place a towel between the bag of ice and your skin.  Change dressings if necessary or as directed.  You may drive in 10 days  Take prescribed medication as directed. Only take over-the-counter or prescription medicines for pain, discomfort, or fever as directed by your caregiver.  Make an appointment to see your caregiver for  suture or staple removal when instructed.  If physical therapy was prescribed, follow your caregiver's directions.  SEEK IMMEDIATE MEDICAL CARE IF: There is redness, swelling, or increasing pain in the wound.  There is pus coming from the wound.  An unexplained oral temperature over 102 F (38.9 C) develops.  There is a bad smell coming from the wound or dressing.  You have swelling in your calf or leg.  You develop shortness of breath or chest pain.  The wound edges break open after sutures or staples have been removed.  Your pain is not controlled with medicine.  You seem to be getting worse rather than better.  Document Released: 01/29/2004 Document Revised: 02/26/2011 Document Reviewed: 04/05/2008 [facility] Patient Information 2012 [place].

## 2021-06-20 NOTE — Progress Notes (Signed)
Patient alert and oriented, voiding adequately, MAE well with no difficulty. Incision area cdi with no s/s of infection. Patient discharged home per order. Patient and sister stated understanding of discharge instructions given. Patient has an appointment with Dr. Tonita Cong

## 2021-06-21 MED ORDER — GLUCOSE BLOOD VI STRP: ORAL_STRIP | 3 refills | Status: DC

## 2021-06-21 MED ORDER — ACCU-CHEK FASTCLIX LANCETS MISC: 3 refills | Status: DC

## 2021-07-08 ENCOUNTER — Ambulatory Visit: Payer: Medicare Other | Admitting: Internal Medicine

## 2021-07-08 DIAGNOSIS — M50223 Other cervical disc displacement at C6-C7 level: Secondary | ICD-10-CM | POA: Diagnosis not present

## 2021-07-10 ENCOUNTER — Encounter: Payer: Self-pay | Admitting: Internal Medicine

## 2021-07-10 ENCOUNTER — Other Ambulatory Visit: Payer: Self-pay

## 2021-07-10 ENCOUNTER — Ambulatory Visit (INDEPENDENT_AMBULATORY_CARE_PROVIDER_SITE_OTHER): Payer: Medicare Other | Admitting: Internal Medicine

## 2021-07-10 VITALS — BP 114/60 | HR 72 | Temp 99.0°F | Ht 62.0 in | Wt 300.0 lb

## 2021-07-10 DIAGNOSIS — Z0001 Encounter for general adult medical examination with abnormal findings: Secondary | ICD-10-CM | POA: Diagnosis not present

## 2021-07-10 DIAGNOSIS — E039 Hypothyroidism, unspecified: Secondary | ICD-10-CM

## 2021-07-10 DIAGNOSIS — E538 Deficiency of other specified B group vitamins: Secondary | ICD-10-CM

## 2021-07-10 DIAGNOSIS — E7800 Pure hypercholesterolemia, unspecified: Secondary | ICD-10-CM | POA: Diagnosis not present

## 2021-07-10 DIAGNOSIS — F341 Dysthymic disorder: Secondary | ICD-10-CM

## 2021-07-10 DIAGNOSIS — E1165 Type 2 diabetes mellitus with hyperglycemia: Secondary | ICD-10-CM

## 2021-07-10 DIAGNOSIS — E559 Vitamin D deficiency, unspecified: Secondary | ICD-10-CM

## 2021-07-10 LAB — BASIC METABOLIC PANEL
BUN: 11 mg/dL (ref 6–23)
CO2: 34 mEq/L — ABNORMAL HIGH (ref 19–32)
Calcium: 9.7 mg/dL (ref 8.4–10.5)
Chloride: 97 mEq/L (ref 96–112)
Creatinine, Ser: 0.64 mg/dL (ref 0.40–1.20)
GFR: 99.38 mL/min (ref >60.00)
Glucose, Bld: 122 mg/dL — ABNORMAL HIGH (ref 70–99)
Potassium: 4.1 mEq/L (ref 3.5–5.1)
Sodium: 136 mEq/L (ref 135–145)

## 2021-07-10 LAB — CBC WITH DIFFERENTIAL/PLATELET
Basophils Absolute: 0.1 10*3/uL (ref 0.0–0.1)
Basophils Relative: 0.6 % (ref 0.0–3.0)
Eosinophils Absolute: 0.2 10*3/uL (ref 0.0–0.7)
Eosinophils Relative: 1.7 % (ref 0.0–5.0)
HCT: 53.8 % — ABNORMAL HIGH (ref 36.0–46.0)
Hemoglobin: 17.3 g/dL — ABNORMAL HIGH (ref 12.0–15.0)
Lymphocytes Relative: 16 % (ref 12.0–46.0)
Lymphs Abs: 1.8 10*3/uL (ref 0.7–4.0)
MCHC: 32.1 g/dL (ref 30.0–36.0)
MCV: 94.9 fl (ref 78.0–100.0)
Monocytes Absolute: 0.8 10*3/uL (ref 0.1–1.0)
Monocytes Relative: 6.7 % (ref 3.0–12.0)
Neutro Abs: 8.4 10*3/uL — ABNORMAL HIGH (ref 1.4–7.7)
Neutrophils Relative %: 75 % (ref 43.0–77.0)
Platelets: 191 10*3/uL (ref 150.0–400.0)
RBC: 5.67 Mil/uL — ABNORMAL HIGH (ref 3.87–5.11)
RDW: 14.3 % (ref 11.5–15.5)
WBC: 11.3 10*3/uL — ABNORMAL HIGH (ref 4.0–10.5)

## 2021-07-10 LAB — HEPATIC FUNCTION PANEL
ALT: 16 U/L (ref 0–35)
AST: 16 U/L (ref 0–37)
Albumin: 4.1 g/dL (ref 3.5–5.2)
Alkaline Phosphatase: 68 U/L (ref 39–117)
Bilirubin, Direct: 0.1 mg/dL (ref 0.0–0.3)
Total Bilirubin: 0.8 mg/dL (ref 0.2–1.2)
Total Protein: 7.3 g/dL (ref 6.0–8.3)

## 2021-07-10 LAB — MICROALBUMIN / CREATININE URINE RATIO
Creatinine,U: 74 mg/dL
Microalb Creat Ratio: 4.5 mg/g (ref 0.0–30.0)
Microalb, Ur: 3.4 mg/dL — ABNORMAL HIGH (ref 0.0–1.9)

## 2021-07-10 LAB — LDL CHOLESTEROL, DIRECT: Direct LDL: 92 mg/dL

## 2021-07-10 LAB — LIPID PANEL
Cholesterol: 160 mg/dL (ref 0–200)
HDL: 34.2 mg/dL — ABNORMAL LOW (ref >39.00)
NonHDL: 125.65
Total CHOL/HDL Ratio: 5
Triglycerides: 263 mg/dL — ABNORMAL HIGH (ref 0.0–149.0)
VLDL: 52.6 mg/dL — ABNORMAL HIGH (ref 0.0–40.0)

## 2021-07-10 LAB — TSH: TSH: 2.55 u[IU]/mL (ref 0.35–5.50)

## 2021-07-10 NOTE — Progress Notes (Signed)
Patient ID: Autumn Massey, female   DOB: 06/12/1966, 56 y.o.   MRN: [id number]         Chief Complaint:: wellness exam and low vit d, chf, hld, low thyroid, dm       HPI:  Autumn Massey is a 56 y.o. female here for wellness exam, plans to have [facility] exam soon as well as pap with GYN as soon as surguries are done. So also for now declines pap smear, covid booster, shingirx, and colonoscopy.  O/w up to date                         Also s/p c spine surgury now with PT, pain with neck and pannus currently well controlled on oxycodone per NS,  Has near resolved tinging in fingers bilaterally and no UE pain or weakness.  Lost several lbs recently.  Looking forward and due for pannus evaluation soon since c spine surgury worked out so well. Pt denies chest pain, increased sob or doe, wheezing, orthopnea, PND, increased LE swelling, palpitations, dizziness or syncope.   Pt denies polydipsia, polyuria, or new focal neuro s/s.  Not taking Vit d.   Pt denies fever, unintended wt loss, night sweats, loss of appetite, or other constitutional symptoms   Wt Readings from Last 3 Encounters:  07/10/21 300 lb (136.1 kg)  06/19/21 (!) 307 lb (139.3 kg)  06/17/21 (!) 307 lb (139.3 kg)   BP Readings from Last 3 Encounters:  07/10/21 114/60  06/20/21 (!) 116/47  06/17/21 116/70   Immunization History  Administered Date(s) Administered   Influenza Split 04/11/2011, 03/15/2012, 03/31/2015   Influenza Whole 02/20/2010   Influenza,inj,Quad PF,6+ Mos 08/11/2013, 03/07/2014, 03/10/2017, 03/02/2018, 04/05/2021   Pneumococcal Conjugate-13 11/22/2014   Pneumococcal Polysaccharide-23 11/04/2016   Td 01/29/2009   Tdap 01/26/2019   There are no preventive care reminders to display for this patient.     Past Medical History:  Diagnosis Date   Anxiety    ANXIETY DEPRESSION 01/29/2009   04/26/2019- no current   Arthritis    Asthma    CHF (congestive heart failure) ([place])    DEPRESSION 11/19/2007    Diabetes ([place])    Dyspnea    FREQUENCY, URINARY 10/17/2009   HOH (hard of hearing)    HYPERLIPIDEMIA 11/19/2007   HYPERTENSION 11/19/2007   HYPOTHYROIDISM 11/19/2007   Morbid obesity ([place])    Neuropathy 01/26/2019   Neuropathy    PNEUMONIA 08/06/2010   Pneumonia    POLYCYTHEMIA 01/29/2009   Preventative health care 04/06/2011   Sleep apnea    mild sleep apnea, on o2 at 2l at nighttime   [facility] CCE 02/08/2009   Venous insufficiency 07/19/2012   Past Surgical History:  Procedure Laterality Date   ANTERIOR CERVICAL CORPECTOMY N/A 06/19/2021   Procedure: Cerivcal Six Corpectomy;  Surgeon: Ashok Pall, MD;  Location: [facility];  Service: Neurosurgery;  Laterality: N/A;   DILITATION & CURRETTAGE/HYSTROSCOPY WITH NOVASURE ABLATION N/A 04/28/2019   Procedure: DILATATION & CURETTAGE/HYSTEROSCOPY WITH NOVASURE ABLATION;  Surgeon: Molli Posey, MD;  Location: [facility];  Service: Gynecology;  Laterality: N/A;  [employer] rep will be here confirmed on 04/20/19   TONSILLECTOMY     ULNAR TUNNEL RELEASE Left 02/22/2013   Procedure: LEFT ULNAR NERVE DECOMPRESSION ;  Surgeon: Tennis Must, MD;  Location: [facility];  Service: Orthopedics;  Laterality: Left;    reports that she has been smoking cigarettes. She has a  11.00 pack-year smoking history. She has never used smokeless tobacco. She reports current alcohol use. She reports that she does not use drugs. family history includes Alcohol abuse in an other family member; Arthritis in an other family member; Cancer in her maternal grandfather and mother; Heart disease in her mother; High Cholesterol in her sister; Hypertension in an other family member; Osteoarthritis (age of onset: 51) in her maternal grandmother; Psoriasis in her brother; Scoliosis in her maternal grandmother. Allergies  Allergen Reactions   Cymbalta [Duloxetine Hcl]     Altered mental state / agitated    Current Outpatient Medications  on File Prior to Visit  Medication Sig Dispense Refill   Accu-Chek FastClix Lancets MISC Use to check blood sugar once a day 100 each 3   albuterol (PROAIR HFA) 108 (90 Base) MCG/ACT inhaler 2 puffs every 6 hours as needed 54 g 3   albuterol (PROVENTIL) (2.5 MG/3ML) 0.083% nebulizer solution Take 3 mLs (2.5 mg total) by nebulization every 6 (six) hours as needed for wheezing or shortness of breath. 75 mL 3   aspirin EC 81 MG tablet Take 81 mg by mouth daily.     bisoprolol-hydrochlorothiazide (ZIAC) 5-6.25 MG tablet TAKE 1 TABLET BY MOUTH  DAILY 90 tablet 3   Blood Glucose Monitoring Suppl (ACCU-CHEK AVIVA PLUS) w/Device KIT Use to check blood sugar once a day 1 kit 0   Cholecalciferol (VITAMIN D3) 75 MCG (3000 UT) TABS Take 3,000 Units by mouth daily.     clonazePAM (KLONOPIN) 0.5 MG tablet Take 1 tablet (0.5 mg total) by mouth 2 (two) times daily as needed for anxiety. 60 tablet 5   cyclobenzaprine (FLEXERIL) 5 MG tablet TAKE 1 TABLET BY MOUTH 3  TIMES DAILY AS NEEDED FOR  MUSCLE SPASM(S) (Patient taking differently: TAKE 1 TABLET BY MOUTH 3  TIMES DAILY AS NEEDED FOR  MUSCLE SPASM(S) On hold while taking taking Vicodin- patient is holding- while on vicodin- for safety.) 270 tablet 1   Diclofenac Sodium (PENNSAID) 2 % SOLN Place 2 g onto the skin 2 (two) times daily. (Patient taking differently: Place 2 g onto the skin 2 (two) times daily as needed (pain).) 112 g 3   empagliflozin (JARDIANCE) 10 MG TABS tablet Take 1 tablet (10 mg total) by mouth daily. 90 tablet 3   FLUoxetine (PROZAC) 40 MG capsule TAKE 1 CAPSULE BY MOUTH  DAILY 90 capsule 1   fluticasone furoate-vilanterol (BREO ELLIPTA) 100-25 MCG/INH AEPB INHALE 1 PUFF INTO THE LUNGS EVERY MORNING. 90 each 3   furosemide (LASIX) 40 MG tablet TAKE 1 TABLET BY MOUTH DAILY (Patient taking differently: Take 40 mg by mouth every other day.) 90 tablet 1   Gabapentin Enacarbil ER (HORIZANT) 300 MG TBCR Take 300 mg by mouth at bedtime. 90 tablet 1    glucose blood (ACCU-CHEK AVIVA) test strip Use to check blood sugar once a day - Aviva Plus 100 each 3   irbesartan (AVAPRO) 75 MG tablet TAKE 1 TABLET BY MOUTH AT  BEDTIME 90 tablet 3   Lancets Misc. (ACCU-CHEK FASTCLIX LANCET) KIT Use to check blood sugar once a day 1 kit 0   levothyroxine (SYNTHROID) 200 MCG tablet TAKE 1 TABLET BY MOUTH EVERY DAY (Patient taking differently: Take 200 mcg by mouth daily before breakfast. Take with 25 mcg to equal 225 mcg daily) 90 tablet 1   levothyroxine (SYNTHROID) 25 MCG tablet TAKE 1 TABLET BY MOUTH EVERY DAY (Patient taking differently: Take 25 mcg by mouth daily before  breakfast. Take with 200 mcg to equal 225 mcg daily) 90 tablet 1   metFORMIN (GLUCOPHAGE-XR) 500 MG 24 hr tablet Take 1 tablet (500 mg total) by mouth daily with breakfast. 90 tablet 3   montelukast (SINGULAIR) 10 MG tablet TAKE 1 TABLET BY MOUTH AT  BEDTIME 90 tablet 1   Multiple Vitamin (MULTIVITAMIN WITH MINERALS) TABS tablet Take 1 tablet by mouth daily.     nystatin cream (MYCOSTATIN) Apply 1 application topically 2 (two) times daily. (Patient taking differently: Apply 1 application topically 2 (two) times daily as needed (yeast).) 30 g 0   ondansetron (ZOFRAN ODT) 4 MG disintegrating tablet Take 1 tablet (4 mg total) by mouth every 8 (eight) hours as needed for nausea or vomiting. 4 tablet 0   oxyCODONE (OXY IR/ROXICODONE) 5 MG immediate release tablet Take 5 mg by mouth 4 (four) times daily as needed.     OXYGEN 2lpm with sleep only  [facility]     primidone (MYSOLINE) 50 MG tablet Take 1 tablet in morning and 3 tablets at bedtime (Patient taking differently: Take 150 mg by mouth at bedtime.) 360 tablet 1   Semaglutide,0.25 or 0.5MG/DOS, (OZEMPIC, 0.25 OR 0.5 MG/DOSE,) 2 MG/1.5ML SOPN Inject 0.5 mg into the skin once a week. 4.5 mL 3   simvastatin (ZOCOR) 40 MG tablet TAKE 1 TABLET BY MOUTH AT  BEDTIME 90 tablet 3   spironolactone (ALDACTONE) 25 MG tablet Take 1 tablet (25 mg total) by mouth 2  (two) times daily. 180 tablet 3   No current facility-administered medications on file prior to visit.        ROS:  All others reviewed and negative.  Objective        PE:  BP 114/60 (BP Location: Right Arm, Patient Position: Sitting, Cuff Size: Large)    Pulse 72    Temp 99 F (37.2 C) (Oral)    Ht '5\' 2"'  (1.575 m)    Wt 300 lb (136.1 kg)    SpO2 94%    BMI 54.87 kg/m                 Constitutional: Pt appears in NAD               HENT: Head: NCAT.                Right Ear: External ear normal.                 Left Ear: External ear normal.                Eyes: . Pupils are equal, round, and reactive to light. Conjunctivae and EOM are normal               Nose: without d/c or deformity               Neck: Neck supple. Gross normal ROM               Cardiovascular: Normal rate and regular rhythm.                 Pulmonary/Chest: Effort normal and breath sounds without rales or wheezing.                Abd:  Soft, NT, ND, + BS, no organomegaly               Neurological: Pt is alert. At baseline orientation, motor grossly intact  Skin: Skin is warm. No rashes, no other new lesions, LE edema - none               Psychiatric: Pt behavior is normal without agitation   Micro: none  Cardiac tracings I have personally interpreted today:  none  Pertinent Radiological findings (summarize): none   Lab Results  Component Value Date   WBC 9.9 06/19/2021   HGB 16.7 (H) 06/19/2021   HCT 53.4 (H) 06/19/2021   PLT 152 06/19/2021   GLUCOSE 174 (H) 06/17/2021   CHOL 162 10/03/2020   TRIG 297.0 (H) 10/03/2020   HDL 31.70 (L) 10/03/2020   LDLDIRECT 95.0 10/03/2020   LDLCALC 109 (H) 01/27/2020   ALT 30 03/06/2021   AST 30 03/06/2021   NA 138 06/17/2021   K 4.2 06/17/2021   CL 99 06/17/2021   CREATININE 0.65 06/17/2021   BUN 14 06/17/2021   CO2 28 06/17/2021   TSH 4.06 03/15/2021   HGBA1C 8.4 (H) 06/17/2021   MICROALBUR 2.2 01/27/2020   Assessment/Plan:  Autumn Massey is a 56 y.o. White or Caucasian [1] female with  has a past medical history of Anxiety, ANXIETY DEPRESSION (01/29/2009), Arthritis, Asthma, CHF (congestive heart failure) ([place]), DEPRESSION (11/19/2007), Diabetes ([place]), Dyspnea, FREQUENCY, URINARY (10/17/2009), HOH (hard of hearing), HYPERLIPIDEMIA (11/19/2007), HYPERTENSION (11/19/2007), HYPOTHYROIDISM (11/19/2007), Morbid obesity ([place]), Neuropathy (01/26/2019), Neuropathy, PNEUMONIA (08/06/2010), Pneumonia, POLYCYTHEMIA (01/29/2009), Preventative health care (04/06/2011), Sleep apnea, SLEEP RELATED HYPOVENTILATION/HYPOXEMIA CCE (02/08/2009), and Venous insufficiency (07/19/2012).  Vitamin D deficiency . Last vitamin D Lab Results  Component Value Date   VD25OH 29.64 (L) 10/03/2020   Low, reminded to start oral replacement   Encounter for well adult exam with abnormal findings Age and sex appropriate education and counseling updated with regular exercise and diet Referrals for preventative services - declines pap and colonoscopy Immunizations addressed - declines covid booster, shingirx Smoking counseling  - counseled to quit, pt not ready Evidence for depression or other mood disorder - chronic anxiety stable,  No change Most recent labs reviewed. I have personally reviewed and have noted: 1) the patient's medical and social history 2) The patient's current medications and supplements 3) The patient's height, weight, and BMI have been recorded in the chart   ANXIETY DEPRESSION Stable, to continue the klonopin prn, declines need for counseling referral  HLD (hyperlipidemia) Lab Results  Component Value Date   LDLCALC 109 (H) 01/27/2020   Uncontrolled, goal ldl < 100, pt to continue current statin zocor but check lipids and low chol diet   Hypothyroidism Lab Results  Component Value Date   TSH 4.06 03/15/2021   Stable, pt to continue levothyroxine   Type 2 diabetes mellitus with hyperglycemia, without long-term  current use of insulin ([place]) Lab Results  Component Value Date   HGBA1C 8.4 (H) 06/17/2021   Mild uncontrolled, pt to continue current medical treatment jardiance, metformin, ozempic and cont wt loss  Followup: Return in about 6 months (around 01/07/2022).  [name], MD 07/10/2021 7:51 PM [facility]

## 2021-07-10 NOTE — Patient Instructions (Signed)
Please continue all other medications as before, and refills have been done if requested.  Please have the pharmacy call with any other refills you may need.  Please continue your efforts at being more active, low cholesterol diet, and weight control.  You are otherwise up to date with prevention measures today.  Please keep your appointments with your specialists as you may have planned  Please go to the LAB at the blood drawing area for the tests to be done  You will be contacted by phone if any changes need to be made immediately.  Otherwise, you will receive a letter about your results with an explanation, but please check with MyChart first.  Please remember to sign up for MyChart if you have not done so, as this will be important to you in the future with finding out test results, communicating by private email, and scheduling acute appointments online when needed.  Please make an Appointment to return in 6 months, or sooner if needed, also with Lab Appointment for testing done 3-5 days before at the [facility] (so this is for TWO appointments - please see the scheduling desk as you leave)  Due to the ongoing Covid 19 pandemic, our lab now requires an appointment for any labs done at our office.  If you need labs done and do not have an appointment, please call our office ahead of time to schedule before presenting to the lab for your testing.  Emmit Alexanders with the further surgury!

## 2021-07-10 NOTE — Assessment & Plan Note (Signed)
Lab Results  Component Value Date   HGBA1C 8.4 (H) 06/17/2021   Mild uncontrolled, pt to continue current medical treatment jardiance, metformin, ozempic and cont wt loss

## 2021-07-10 NOTE — Assessment & Plan Note (Signed)
Stable, to continue the klonopin prn, declines need for counseling referral

## 2021-07-10 NOTE — Assessment & Plan Note (Signed)
Age and sex appropriate education and counseling updated with regular exercise and diet Referrals for preventative services - declines pap and colonoscopy Immunizations addressed - declines covid booster, shingirx Smoking counseling  - counseled to quit, pt not ready Evidence for depression or other mood disorder - chronic anxiety stable,  No change Most recent labs reviewed. I have personally reviewed and have noted: 1) the patient's medical and social history 2) The patient's current medications and supplements 3) The patient's height, weight, and BMI have been recorded in the chart

## 2021-07-10 NOTE — Assessment & Plan Note (Signed)
Lab Results  Component Value Date   LDLCALC 109 (H) 01/27/2020   Uncontrolled, goal ldl < 100, pt to continue current statin zocor but check lipids and low chol diet

## 2021-07-10 NOTE — Assessment & Plan Note (Signed)
Lab Results  Component Value Date   TSH 4.06 03/15/2021   Stable, pt to continue levothyroxine

## 2021-07-10 NOTE — Assessment & Plan Note (Signed)
. °  Last vitamin D Lab Results  Component Value Date   VD25OH 29.64 (L) 10/03/2020   Low, reminded to start oral replacement

## 2021-07-11 LAB — URINALYSIS, ROUTINE W REFLEX MICROSCOPIC
Bilirubin Urine: NEGATIVE
Hgb urine dipstick: NEGATIVE
Ketones, ur: NEGATIVE
Leukocytes,Ua: NEGATIVE
Nitrite: POSITIVE — AB
RBC / HPF: NONE SEEN (ref 0–?)
Specific Gravity, Urine: 1.015 (ref 1.000–1.030)
Total Protein, Urine: NEGATIVE
Urine Glucose: >=1000 — AB
Urobilinogen, UA: 0.2 (ref 0.0–1.0)
pH: 6 (ref 5.0–8.0)

## 2021-07-11 LAB — HEMOGLOBIN A1C: Hgb A1c MFr Bld: 8.2 % — ABNORMAL HIGH (ref 4.6–6.5)

## 2021-07-11 LAB — VITAMIN D 25 HYDROXY (VIT D DEFICIENCY, FRACTURES): VITD: 35.27 ng/mL (ref 30.00–100.00)

## 2021-07-11 LAB — VITAMIN B12: Vitamin B-12: 587 pg/mL (ref 211–911)

## 2021-07-12 ENCOUNTER — Other Ambulatory Visit: Payer: Self-pay | Admitting: Neurology

## 2021-07-12 ENCOUNTER — Ambulatory Visit: Payer: Medicare Other | Admitting: Internal Medicine

## 2021-07-12 ENCOUNTER — Other Ambulatory Visit: Payer: Self-pay | Admitting: Internal Medicine

## 2021-07-12 ENCOUNTER — Encounter: Payer: Self-pay | Admitting: Internal Medicine

## 2021-07-12 ENCOUNTER — Telehealth: Payer: Self-pay | Admitting: Internal Medicine

## 2021-07-12 DIAGNOSIS — E1165 Type 2 diabetes mellitus with hyperglycemia: Secondary | ICD-10-CM

## 2021-07-12 MED ORDER — ACCU-CHEK SOFTCLIX LANCETS MISC: 3 refills | Status: DC

## 2021-07-12 NOTE — Progress Notes (Deleted)
Patient ID: Autumn Massey, female   DOB: Nov 25, 1965, 56 y.o.   MRN: [id number]  This visit occurred during the SARS-CoV-2 public health emergency.  Safety protocols were in place, including screening questions prior to the visit, additional usage of staff PPE, and extensive cleaning of exam room while observing appropriate contact time as indicated for disinfecting solutions.   HPI  Autumn Massey is a 56 y.o.-year-old female, returning for f/u for DM2, hypothyroidism, and PCOS. Last visit 3 months ago.  Interim history: She continues to have shortness of breath with exertion. She previously saw cardiology and had evaluation - no cardiovascular issues. She gained a significant amount of weight after having had COVID at the beginning of 2022.  We could not use Ozempic due to developing GI symptoms with elevated lipase. She was referred to a plastic surgeon to remove her abd. [place]. Before last visit, she had numbness and tingling in both hands - starting with pressure in neck >> she had cervical spine surgery by Dr. Christella Noa last month.  PCOS: Reviewed history:  - weight gain: Maximal weight 364  - was on Phentermine, which worked initially, then stopped working  - Would consider gastric bypass but only as a last resort - + hirsutism >> started to see facial hair in her 30s - + hair loss >> female pattern, and bald spot in vertex - No acne - had a Mirena IUD placed 04/2013 for heavy menstrual cycles >> out now; no menses since IUD out - has frequent Prednisone tapers    She was previously on spironolactone 50 mg twice daily but her potassium increased so we had to decrease the dose to 25 mg twice daily.  We ended up stopping spironolactone 11/2016.  However, she started to have more facial hair and more frontal balding so we restarted spironolactone 25 mg twice a day.  Her potassium level is normal now: Lab Results  Component Value Date   K 4.1 07/10/2021   Hypothyroidism: Pt. has been  dx with hypothyroidism in 2005.  Pt is on levothyroxine (crushed) 225 mcg daily (increased at last visit), taken: - in am - fasting - at least 30 min from b'fast - no Ca, Fe - + MVI 2h later! >> moved >4h later - no PPIs - not on Biotin  Reviewed her TFTs: Lab Results  Component Value Date   TSH 2.55 07/10/2021   TSH 4.06 03/15/2021   TSH 21.97 (H) 12/06/2020   TSH 13.74 (H) 10/03/2020   TSH 42.400 (H) 09/19/2020   TSH 71.60 (H) 01/27/2020   TSH 0.80 10/20/2019   TSH 0.19 (L) 03/01/2019   TSH 0.16 (L) 01/26/2019   TSH 0.65 07/26/2018   FREET4 1.02 03/15/2021   FREET4 0.70 12/06/2020   FREET4 0.90 10/03/2020   FREET4 0.68 (L) 09/19/2020   FREET4 1.17 10/20/2019   FREET4 1.23 03/01/2019   FREET4 1.21 07/26/2018   FREET4 0.97 04/30/2018   FREET4 1.32 03/02/2018   FREET4 1.25 05/05/2017    Pt denies: - feeling nodules in neck - hoarseness - dysphagia - choking - SOB with lying down  She was on Neurontin for tremors, but had to decrease the dose because of somnolence and then she was able to stop. She is on primidone >> restarted by Dr. Tomi Likens.  She was on Lyrica at night >> switched to Gabapentin.  DM2: Reviewed HbA1c levels: Lab Results  Component Value Date   HGBA1C 8.2 (H) 07/10/2021   HGBA1C 8.4 (H) 06/17/2021  HGBA1C 8.3 (A) 04/08/2021  04/26/2019: HbA1c calculated from fructosamine is 5.4% 03/09/2018: HbA1c calculated from fructosamine is better, at 5.5%! 09/02/2017: HbA1c calculated from Fructosamine is 6.1%. 05/05/2017: HbA1c calculated from Fructosamine is 6.00%. 03/10/2017: HbA1c calculated from Fructosamine is 6.17% (great!). 11/2016: HbA1c calculated from the fructosamine is radically better, at 5.77%!  She is on: - Metformin 1000 mg with dinner >> 500 mg with dinner >> in a.m. >> at night - Jardiance 10 mg in the pm -  started 02/2017 >> initially stopped b/c UTI and also had a tear on her labia >> resolved.  We restarted Jardiance in 02/2018 and she  is tolerating it well now - - off now (see above)  Not taking Januvia >> not covered. She was previously on glipizide.  At this visit, she is not checking sugars at all.  Previously: - am: 131 >> n/c >> 108, 120-130s, 147 >> 120s - lunch: n/c - after lunch: 145-150 >> 142 >> 131-152 >> n/c - dinner: n/c >> 120-140, 157 >> n/c - after dinner: 108 >> 150-160, 170 >> 149-170  She quit smoking 05/2015.  She is on weekly ergocalciferol.  She had a D&C on 04/28/2019.  ROS: + See HPI + Tremors   I reviewed pt's medications, allergies, PMH, social hx, family hx, and changes were documented in the history of present illness. Otherwise, unchanged from my initial visit note.  She does not have a family history of medullary thyroid cancer or personal history of pancreatitis.  Past Medical History:  Diagnosis Date   Anxiety    ANXIETY DEPRESSION 01/29/2009   04/26/2019- no current   Arthritis    Asthma    CHF (congestive heart failure) ([place])    DEPRESSION 11/19/2007   Diabetes ([place])    Dyspnea    FREQUENCY, URINARY 10/17/2009   HOH (hard of hearing)    HYPERLIPIDEMIA 11/19/2007   HYPERTENSION 11/19/2007   HYPOTHYROIDISM 11/19/2007   Morbid obesity ([place])    Neuropathy 01/26/2019   Neuropathy    PNEUMONIA 08/06/2010   Pneumonia    POLYCYTHEMIA 01/29/2009   Preventative health care 04/06/2011   Sleep apnea    mild sleep apnea, on o2 at 2l at nighttime   [place] CCE 02/08/2009   Venous insufficiency 07/19/2012   Past Surgical History:  Procedure Laterality Date   ANTERIOR CERVICAL CORPECTOMY N/A 06/19/2021   Procedure: Cerivcal Six Corpectomy;  Surgeon: Ashok Pall, MD;  Location: [facility];  Service: Neurosurgery;  Laterality: N/A;   DILITATION & CURRETTAGE/HYSTROSCOPY WITH NOVASURE ABLATION N/A 04/28/2019   Procedure: DILATATION & CURETTAGE/HYSTEROSCOPY WITH NOVASURE ABLATION;  Surgeon: Molli Posey, MD;  Location: [facility];  Service:  Gynecology;  Laterality: N/A;  [employer] rep will be here confirmed on 04/20/19   TONSILLECTOMY     ULNAR TUNNEL RELEASE Left 02/22/2013   Procedure: LEFT ULNAR NERVE DECOMPRESSION ;  Surgeon: Tennis Must, MD;  Location: [facility];  Service: Orthopedics;  Laterality: Left;   Social History   Socioeconomic History   Marital status: Divorced    Spouse name: Not on file   Number of children: 0   Years of education: 12   Highest education level: 12th grade  Occupational History   Occupation: Disabled    Employer: [employer]  Tobacco Use   Smoking status: Every Day    Packs/day: 0.50    Years: 22.00    Pack years: 11.00    Types: Cigarettes   Smokeless tobacco: Never  Vaping Use   Vaping Use: Never used  Substance and Sexual Activity   Alcohol use: Yes    Alcohol/week: 0.0 standard drinks    Comment: rare   Drug use: No   Sexual activity: Never  Other Topics Concern   Not on file  Social History Narrative   Patient is right-handed.   Social Determinants of Health   Financial Resource Strain: Not on file  Food Insecurity: Not on file  Transportation Needs: Not on file  Physical Activity: Not on file  Stress: Not on file  Social Connections: Not on file  Intimate Partner Violence: Not on file   Current Outpatient Medications on File Prior to Visit  Medication Sig Dispense Refill   Accu-Chek FastClix Lancets MISC Use to check blood sugar once a day 100 each 3   albuterol (PROAIR HFA) 108 (90 Base) MCG/ACT inhaler 2 puffs every 6 hours as needed 54 g 3   albuterol (PROVENTIL) (2.5 MG/3ML) 0.083% nebulizer solution Take 3 mLs (2.5 mg total) by nebulization every 6 (six) hours as needed for wheezing or shortness of breath. 75 mL 3   aspirin EC 81 MG tablet Take 81 mg by mouth daily.     bisoprolol-hydrochlorothiazide (ZIAC) 5-6.25 MG tablet TAKE 1 TABLET BY MOUTH  DAILY 90 tablet 3   Blood Glucose Monitoring Suppl (ACCU-CHEK AVIVA PLUS) w/Device KIT  Use to check blood sugar once a day 1 kit 0   Cholecalciferol (VITAMIN D3) 75 MCG (3000 UT) TABS Take 3,000 Units by mouth daily.     clonazePAM (KLONOPIN) 0.5 MG tablet Take 1 tablet (0.5 mg total) by mouth 2 (two) times daily as needed for anxiety. 60 tablet 5   cyclobenzaprine (FLEXERIL) 5 MG tablet TAKE 1 TABLET BY MOUTH 3  TIMES DAILY AS NEEDED FOR  MUSCLE SPASM(S) (Patient taking differently: TAKE 1 TABLET BY MOUTH 3  TIMES DAILY AS NEEDED FOR  MUSCLE SPASM(S) On hold while taking taking Vicodin- patient is holding- while on vicodin- for safety.) 270 tablet 1   Diclofenac Sodium (PENNSAID) 2 % SOLN Place 2 g onto the skin 2 (two) times daily. (Patient taking differently: Place 2 g onto the skin 2 (two) times daily as needed (pain).) 112 g 3   empagliflozin (JARDIANCE) 10 MG TABS tablet Take 1 tablet (10 mg total) by mouth daily. 90 tablet 3   FLUoxetine (PROZAC) 40 MG capsule TAKE 1 CAPSULE BY MOUTH  DAILY 90 capsule 1   fluticasone furoate-vilanterol (BREO ELLIPTA) 100-25 MCG/INH AEPB INHALE 1 PUFF INTO THE LUNGS EVERY MORNING. 90 each 3   furosemide (LASIX) 40 MG tablet TAKE 1 TABLET BY MOUTH DAILY (Patient taking differently: Take 40 mg by mouth every other day.) 90 tablet 1   Gabapentin Enacarbil ER (HORIZANT) 300 MG TBCR Take 300 mg by mouth at bedtime. 90 tablet 1   glucose blood (ACCU-CHEK AVIVA) test strip Use to check blood sugar once a day - Aviva Plus 100 each 3   irbesartan (AVAPRO) 75 MG tablet TAKE 1 TABLET BY MOUTH AT  BEDTIME 90 tablet 3   Lancets Misc. (ACCU-CHEK FASTCLIX LANCET) KIT Use to check blood sugar once a day 1 kit 0   levothyroxine (SYNTHROID) 200 MCG tablet TAKE 1 TABLET BY MOUTH EVERY DAY (Patient taking differently: Take 200 mcg by mouth daily before breakfast. Take with 25 mcg to equal 225 mcg daily) 90 tablet 1   levothyroxine (SYNTHROID) 25 MCG tablet TAKE 1 TABLET BY MOUTH EVERY DAY (Patient taking  differently: Take 25 mcg by mouth daily before breakfast. Take  with 200 mcg to equal 225 mcg daily) 90 tablet 1   metFORMIN (GLUCOPHAGE-XR) 500 MG 24 hr tablet Take 1 tablet (500 mg total) by mouth daily with breakfast. 90 tablet 3   montelukast (SINGULAIR) 10 MG tablet TAKE 1 TABLET BY MOUTH AT  BEDTIME 90 tablet 1   Multiple Vitamin (MULTIVITAMIN WITH MINERALS) TABS tablet Take 1 tablet by mouth daily.     nystatin cream (MYCOSTATIN) Apply 1 application topically 2 (two) times daily. (Patient taking differently: Apply 1 application topically 2 (two) times daily as needed (yeast).) 30 g 0   ondansetron (ZOFRAN ODT) 4 MG disintegrating tablet Take 1 tablet (4 mg total) by mouth every 8 (eight) hours as needed for nausea or vomiting. 4 tablet 0   oxyCODONE (OXY IR/ROXICODONE) 5 MG immediate release tablet Take 5 mg by mouth 4 (four) times daily as needed.     OXYGEN 2lpm with sleep only  [facility]     primidone (MYSOLINE) 50 MG tablet Take 1 tablet in morning and 3 tablets at bedtime (Patient taking differently: Take 150 mg by mouth at bedtime.) 360 tablet 1   Semaglutide,0.25 or 0.5MG/DOS, (OZEMPIC, 0.25 OR 0.5 MG/DOSE,) 2 MG/1.5ML SOPN Inject 0.5 mg into the skin once a week. 4.5 mL 3   simvastatin (ZOCOR) 40 MG tablet TAKE 1 TABLET BY MOUTH AT  BEDTIME 90 tablet 3   spironolactone (ALDACTONE) 25 MG tablet Take 1 tablet (25 mg total) by mouth 2 (two) times daily. 180 tablet 3   No current facility-administered medications on file prior to visit.   Allergies  Allergen Reactions   Cymbalta [Duloxetine Hcl]     Altered mental state / agitated    Family History  Problem Relation Age of Onset   Cancer Mother        melanoma    Heart disease Mother    Cancer Maternal Grandfather        prostate cancer   Alcohol abuse Other    Arthritis Other    Hypertension Other    High Cholesterol Sister    Psoriasis Brother    Osteoarthritis Maternal Grandmother 99   Scoliosis Maternal Grandmother    PE: There were no vitals taken for this visit. Wt Readings from  Last 3 Encounters:  07/10/21 300 lb (136.1 kg)  06/19/21 (!) 307 lb (139.3 kg)  06/17/21 (!) 307 lb (139.3 kg)   Constitutional: overweight, in NAD Eyes: PERRLA, EOMI, no exophthalmos ENT: moist mucous membranes, no thyromegaly, no cervical lymphadenopathy Cardiovascular: RRR, No MRG Respiratory: CTA B Musculoskeletal: no deformities, strength intact in all 4 Skin: moist, warm, + stasis dermatitis rashes on lower legs R>L Neurological: no tremor with outstretched hands, DTR normal in all 4  PLAN:  1. Patient with longstanding hypothyroidism -Previously uncontrolled -Before last visit, she had very high TSH levels, 71 and 42 respectively, despite a high dose of levothyroxine.  I was not aware of these results, but I became aware of her TSH from 09/2020 which improved to 13.7.  Afterwards, we discussed about the need for compliance and I also advised her to crush the levothyroxine pill.  TFTs normalized afterwards. - latest thyroid labs reviewed with pt. >> normal: Lab Results  Component Value Date   TSH 2.55 07/10/2021  - she continues on LT4 225 mcg daily - crushed - pt feels good on this dose. - we discussed about taking the thyroid hormone every day, with  water, >30 minutes before breakfast, separated by >4 hours from acid reflux medications, calcium, iron, multivitamins. Pt. is taking it correctly.  2. PCOS -She aged out of her PCOS diagnosis -However, she continues to have increased facial hair and alopecia so she continues on spironolactone 25 mg twice a day. -Reviewed latest potassium level which was normal: Lab Results  Component Value Date   K 4.1 07/10/2021  -We could not increase the dose further due to previous hyperkalemia with higher doses  3. DM2 -She continues on metformin and SGLT2 inhibitor.  We tried to start her on a GLP-1 receptor agonist but she developed abdominal pressure, constipation, and abdominal pain with Ozempic.  She also had a high lipase so I was  reticent to have her try it again, although she definitely would have wanted to do so.  Latest HbA1c was higher, at 8.4%.  - I suggested: Patient Instructions  Please continue: - Metformin ER 500 mg with breakfast - Jardiance 10 mg before breakfast  Please continue the crushed levothyroxine 225 g daily.  Take the thyroid hormone every day, with water, at least 30 minutes before breakfast, separated by at least 4 hours from: - acid reflux medications - calcium - iron - multivitamins  Continue spironolactone 25 mg twice a day.  Please return in 3 months.  4.  Obesity class III -Continue Jardiance which should also help with weight loss -Was previously also exercising at the pool but not currently -In the past, she was doing a partially keto diet with intermittent fasting 16-8 -She gained a significant amount of weight during her COVID infection at the beginning of 2022 -Before last visit, she changed to eating smaller portions, mostly protein and she lost 11 pounds  Philemon Kingdom, MD PhD [facility] Endocr[facility]

## 2021-07-12 NOTE — Telephone Encounter (Signed)
Patient called to advise that she uses Accu-Chek Soft Clik not Fast Clik and is requested a new RX be sent to [facility] for 90 days supply of Accu-Chek Soft Clik.

## 2021-07-12 NOTE — Telephone Encounter (Signed)
Rx sent to preferred pharmacy.

## 2021-07-16 MED ORDER — ACCU-CHEK SOFTCLIX LANCETS MISC: 3 refills | Status: DC

## 2021-07-16 NOTE — Telephone Encounter (Signed)
Patient called on 07/16/21 to advise that she had asked for the [place] to be sent to [facility] not [facility] - see original note    Please send an RX for Accu-Chel Soft Clik lancets

## 2021-07-16 NOTE — Telephone Encounter (Signed)
RX has now been sent to [facility]

## 2021-07-16 NOTE — Addendum Note (Signed)
Addended by: Sarina Ill on: 07/16/2021 03:33 PM   Modules accepted: Orders

## 2021-07-19 ENCOUNTER — Encounter: Payer: Self-pay | Admitting: Internal Medicine

## 2021-07-19 ENCOUNTER — Other Ambulatory Visit: Payer: Self-pay

## 2021-07-19 ENCOUNTER — Ambulatory Visit (INDEPENDENT_AMBULATORY_CARE_PROVIDER_SITE_OTHER): Payer: Medicare Other | Admitting: Internal Medicine

## 2021-07-19 VITALS — BP 120/62 | HR 96 | Ht 62.0 in | Wt 296.8 lb

## 2021-07-19 DIAGNOSIS — E1165 Type 2 diabetes mellitus with hyperglycemia: Secondary | ICD-10-CM | POA: Diagnosis not present

## 2021-07-19 DIAGNOSIS — E282 Polycystic ovarian syndrome: Secondary | ICD-10-CM | POA: Diagnosis not present

## 2021-07-19 DIAGNOSIS — E039 Hypothyroidism, unspecified: Secondary | ICD-10-CM

## 2021-07-19 NOTE — Progress Notes (Signed)
Patient ID: Autumn Massey, female   DOB: 1966-03-01, 56 y.o.   MRN: [id number]  This visit occurred during the SARS-CoV-2 public health emergency.  Safety protocols were in place, including screening questions prior to the visit, additional usage of staff PPE, and extensive cleaning of exam room while observing appropriate contact time as indicated for disinfecting solutions.   HPI  Autumn Massey is a 56 y.o.-year-old female, returning for f/u for DM2, hypothyroidism, and PCOS. Last visit 3 months ago.  Interim history: She continues to have shortness of breath with exertion.  She previously saw cardiology and had evaluation that normal cardiovascular ideations. She gained a significant amount of weight after having had COVID-19 at the beginning of 2022.  We could not use Ozempic due to developing GI symptoms and elevated lipase. She was referred to a plastic surgeon to remove her abdominal pannus Before last visit, she had numbness and tingling in both hands-starting with pressure in neck.  She had cervical spine surgery by Dr. Christella Noa 4 weeks ago. She feels better after surgery.  She recently cut out fast foods, eats 4-5 small meals a day. She lost 12 lbs. She stopped artificial sweetener. Uses Monk fruit. Stopped sweet drinks ~ 1 mo ago.  PCOS: Reviewed history:  - weight gain: Maximal weight 364  - was on Phentermine, which worked initially, then stopped working  - Would consider gastric bypass but only as a last resort - + hirsutism >> started to see facial hair in her 30s - + hair loss >> female pattern, and bald spot in vertex - No acne - had a Mirena IUD placed 04/2013 for heavy menstrual cycles >> out now; no menses since IUD out - has frequent Prednisone tapers    She was previously on spironolactone 50 mg twice daily but her potassium increased so we had to decrease the dose to 25 mg twice daily.  We ended up stopping spironolactone 11/2016.  However, she started to have more  facial hair and more frontal balding so we restarted spironolactone 25 mg twice a day.  Her potassium level is normal now: Lab Results  Component Value Date   K 4.1 07/10/2021   Hypothyroidism: Pt. has been dx with hypothyroidism in 2005.  Pt is on levothyroxine (crushed) 225 mcg daily (increased at last visit), taken: - in am - fasting - at least 30 min from b'fast - no Ca, Fe - + MVI 2h later! >> moved >4h later - no PPIs - not on Biotin  Reviewed her TFTs: Lab Results  Component Value Date   TSH 2.55 07/10/2021   TSH 4.06 03/15/2021   TSH 21.97 (H) 12/06/2020   TSH 13.74 (H) 10/03/2020   TSH 42.400 (H) 09/19/2020   TSH 71.60 (H) 01/27/2020   TSH 0.80 10/20/2019   TSH 0.19 (L) 03/01/2019   TSH 0.16 (L) 01/26/2019   TSH 0.65 07/26/2018   FREET4 1.02 03/15/2021   FREET4 0.70 12/06/2020   FREET4 0.90 10/03/2020   FREET4 0.68 (L) 09/19/2020   FREET4 1.17 10/20/2019   FREET4 1.23 03/01/2019   FREET4 1.21 07/26/2018   FREET4 0.97 04/30/2018   FREET4 1.32 03/02/2018   FREET4 1.25 05/05/2017    Pt denies: - feeling nodules in neck - hoarseness - dysphagia - choking - SOB with lying down  She was on Neurontin for tremors, but had to decrease the dose because of somnolence and then she was able to stop. She is on primidone >> restarted by  Dr. Tomi Likens.  She was on Lyrica at night >> switched to Gabapentin.  DM2: Reviewed HbA1c levels: Lab Results  Component Value Date   HGBA1C 8.2 (H) 07/10/2021   HGBA1C 8.4 (H) 06/17/2021   HGBA1C 8.3 (A) 04/08/2021  04/26/2019: HbA1c calculated from fructosamine is 5.4% 03/09/2018: HbA1c calculated from fructosamine is better, at 5.5%! 09/02/2017: HbA1c calculated from Fructosamine is 6.1%. 05/05/2017: HbA1c calculated from Fructosamine is 6.00%. 03/10/2017: HbA1c calculated from Fructosamine is 6.17% (great!). 11/2016: HbA1c calculated from the fructosamine is radically better, at 5.77%!  She is on: - Metformin 1000 mg with  dinner >> 500 mg with dinner >> in a.m. >> at night - Jardiance 10 mg in the pm -  started 02/2017 >> initially stopped b/c UTI and also had a tear on her labia >> resolved.  We restarted Jardiance in 02/2018 and she is tolerating it well now - - off now (see above)  Not taking Januvia >> not covered. She was previously on glipizide.  She just started to check sugars:  - am: 131 >> n/c >> 108, 120-130s, 147 >> 120s >> 123, 141 - lunch: n/c - after lunch: 145-150 >> 142 >> 131-152 >> n/c - dinner: n/c >> 120-140, 157 >> n/c - after dinner: 108 >> 150-160, 170 >> 149-170 >> 173-180  She quit smoking 05/2015.  She is on weekly ergocalciferol.  She had a D&C on 04/28/2019.  ROS: + See HPI + Tremors   I reviewed pt's medications, allergies, PMH, social hx, family hx, and changes were documented in the history of present illness. Otherwise, unchanged from my initial visit note.  She does not have a family history of medullary thyroid cancer or personal history of pancreatitis.  Past Medical History:  Diagnosis Date   Anxiety    ANXIETY DEPRESSION 01/29/2009   04/26/2019- no current   Arthritis    Asthma    CHF (congestive heart failure) ([place])    DEPRESSION 11/19/2007   Diabetes ([place])    Dyspnea    FREQUENCY, URINARY 10/17/2009   HOH (hard of hearing)    HYPERLIPIDEMIA 11/19/2007   HYPERTENSION 11/19/2007   HYPOTHYROIDISM 11/19/2007   Morbid obesity ([place])    Neuropathy 01/26/2019   Neuropathy    PNEUMONIA 08/06/2010   Pneumonia    POLYCYTHEMIA 01/29/2009   Preventative health care 04/06/2011   Sleep apnea    mild sleep apnea, on o2 at 2l at nighttime   [place] CCE 02/08/2009   Venous insufficiency 07/19/2012   Past Surgical History:  Procedure Laterality Date   ANTERIOR CERVICAL CORPECTOMY N/A 06/19/2021   Procedure: Cerivcal Six Corpectomy;  Surgeon: Ashok Pall, MD;  Location: [facility];  Service: Neurosurgery;  Laterality: N/A;    DILITATION & CURRETTAGE/HYSTROSCOPY WITH NOVASURE ABLATION N/A 04/28/2019   Procedure: DILATATION & CURETTAGE/HYSTEROSCOPY WITH NOVASURE ABLATION;  Surgeon: Molli Posey, MD;  Location: [facility];  Service: Gynecology;  Laterality: N/A;  [employer] rep will be here confirmed on 04/20/19   TONSILLECTOMY     ULNAR TUNNEL RELEASE Left 02/22/2013   Procedure: LEFT ULNAR NERVE DECOMPRESSION ;  Surgeon: Tennis Must, MD;  Location: [facility];  Service: Orthopedics;  Laterality: Left;   Social History   Socioeconomic History   Marital status: Divorced    Spouse name: Not on file   Number of children: 0   Years of education: 12   Highest education level: 12th grade  Occupational History   Occupation: Disabled    Employer:  [employer]  Tobacco Use   Smoking status: Every Day    Packs/day: 0.50    Years: 22.00    Pack years: 11.00    Types: Cigarettes   Smokeless tobacco: Never  Vaping Use   Vaping Use: Never used  Substance and Sexual Activity   Alcohol use: Yes    Alcohol/week: 0.0 standard drinks    Comment: rare   Drug use: No   Sexual activity: Never  Other Topics Concern   Not on file  Social History Narrative   Patient is right-handed.   Social Determinants of Health   Financial Resource Strain: Not on file  Food Insecurity: Not on file  Transportation Needs: Not on file  Physical Activity: Not on file  Stress: Not on file  Social Connections: Not on file  Intimate Partner Violence: Not on file   Current Outpatient Medications on File Prior to Visit  Medication Sig Dispense Refill   Accu-Chek Softclix Lancets lancets Use as instructed to check blood sugar once daily 100 each 3   albuterol (PROAIR HFA) 108 (90 Base) MCG/ACT inhaler 2 puffs every 6 hours as needed 54 g 3   albuterol (PROVENTIL) (2.5 MG/3ML) 0.083% nebulizer solution Take 3 mLs (2.5 mg total) by nebulization every 6 (six) hours as needed for wheezing or shortness of breath. 75 mL 3    aspirin EC 81 MG tablet Take 81 mg by mouth daily.     bisoprolol-hydrochlorothiazide (ZIAC) 5-6.25 MG tablet TAKE 1 TABLET BY MOUTH  DAILY 90 tablet 3   Blood Glucose Monitoring Suppl (ACCU-CHEK AVIVA PLUS) w/Device KIT Use to check blood sugar once a day 1 kit 0   Cholecalciferol (VITAMIN D3) 75 MCG (3000 UT) TABS Take 3,000 Units by mouth daily.     clonazePAM (KLONOPIN) 0.5 MG tablet Take 1 tablet (0.5 mg total) by mouth 2 (two) times daily as needed for anxiety. 60 tablet 5   cyclobenzaprine (FLEXERIL) 5 MG tablet TAKE 1 TABLET BY MOUTH 3  TIMES DAILY AS NEEDED FOR  MUSCLE SPASM(S) (Patient taking differently: TAKE 1 TABLET BY MOUTH 3  TIMES DAILY AS NEEDED FOR  MUSCLE SPASM(S) On hold while taking taking Vicodin- patient is holding- while on vicodin- for safety.) 270 tablet 1   Diclofenac Sodium (PENNSAID) 2 % SOLN Place 2 g onto the skin 2 (two) times daily. (Patient taking differently: Place 2 g onto the skin 2 (two) times daily as needed (pain).) 112 g 3   empagliflozin (JARDIANCE) 10 MG TABS tablet Take 1 tablet (10 mg total) by mouth daily. 90 tablet 3   FLUoxetine (PROZAC) 40 MG capsule TAKE 1 CAPSULE BY MOUTH  DAILY 90 capsule 3   fluticasone furoate-vilanterol (BREO ELLIPTA) 100-25 MCG/INH AEPB INHALE 1 PUFF INTO THE LUNGS EVERY MORNING. 90 each 3   furosemide (LASIX) 40 MG tablet TAKE 1 TABLET BY MOUTH DAILY (Patient taking differently: Take 40 mg by mouth every other day.) 90 tablet 1   glucose blood (ACCU-CHEK AVIVA) test strip Use to check blood sugar once a day - Aviva Plus 100 each 3   HORIZANT 300 MG TBCR TAKE 1 TABLET BY MOUTH AT  BEDTIME 90 tablet 0   irbesartan (AVAPRO) 75 MG tablet TAKE 1 TABLET BY MOUTH AT  BEDTIME 90 tablet 3   Lancets Misc. (ACCU-CHEK FASTCLIX LANCET) KIT Use to check blood sugar once a day 1 kit 0   levothyroxine (SYNTHROID) 200 MCG tablet TAKE 1 TABLET BY MOUTH EVERY DAY (Patient taking  differently: Take 200 mcg by mouth daily before breakfast. Take with  25 mcg to equal 225 mcg daily) 90 tablet 1   levothyroxine (SYNTHROID) 25 MCG tablet TAKE 1 TABLET BY MOUTH EVERY DAY (Patient taking differently: Take 25 mcg by mouth daily before breakfast. Take with 200 mcg to equal 225 mcg daily) 90 tablet 1   metFORMIN (GLUCOPHAGE-XR) 500 MG 24 hr tablet Take 1 tablet (500 mg total) by mouth daily with breakfast. 90 tablet 3   montelukast (SINGULAIR) 10 MG tablet TAKE 1 TABLET BY MOUTH AT  BEDTIME 90 tablet 3   Multiple Vitamin (MULTIVITAMIN WITH MINERALS) TABS tablet Take 1 tablet by mouth daily.     nystatin cream (MYCOSTATIN) Apply 1 application topically 2 (two) times daily. (Patient taking differently: Apply 1 application topically 2 (two) times daily as needed (yeast).) 30 g 0   ondansetron (ZOFRAN ODT) 4 MG disintegrating tablet Take 1 tablet (4 mg total) by mouth every 8 (eight) hours as needed for nausea or vomiting. 4 tablet 0   oxyCODONE (OXY IR/ROXICODONE) 5 MG immediate release tablet Take 5 mg by mouth 4 (four) times daily as needed.     OXYGEN 2lpm with sleep only  [facility]     primidone (MYSOLINE) 50 MG tablet TAKE 1 TABLET BY MOUTH IN  THE MORNING AND 3 TABLETS  BY MOUTH AT BEDTIME 360 tablet 0   Semaglutide,0.25 or 0.5MG/DOS, (OZEMPIC, 0.25 OR 0.5 MG/DOSE,) 2 MG/1.5ML SOPN Inject 0.5 mg into the skin once a week. 4.5 mL 3   simvastatin (ZOCOR) 40 MG tablet TAKE 1 TABLET BY MOUTH AT  BEDTIME 90 tablet 3   spironolactone (ALDACTONE) 25 MG tablet Take 1 tablet (25 mg total) by mouth 2 (two) times daily. 180 tablet 3   No current facility-administered medications on file prior to visit.   Allergies  Allergen Reactions   Cymbalta [Duloxetine Hcl]     Altered mental state / agitated    Family History  Problem Relation Age of Onset   Cancer Mother        melanoma    Heart disease Mother    Cancer Maternal Grandfather        prostate cancer   Alcohol abuse Other    Arthritis Other    Hypertension Other    High Cholesterol Sister     Psoriasis Brother    Osteoarthritis Maternal Grandmother 99   Scoliosis Maternal Grandmother    PE: BP 120/62 (BP Location: Left Arm, Patient Position: Sitting, Cuff Size: Normal)    Pulse 96    Ht '5\' 2"'  (1.575 m)    Wt 296 lb 12.8 oz (134.6 kg)    SpO2 92%    BMI 54.29 kg/m  Wt Readings from Last 3 Encounters:  07/19/21 296 lb 12.8 oz (134.6 kg)  07/10/21 300 lb (136.1 kg)  06/19/21 (!) 307 lb (139.3 kg)   Constitutional: overweight, in NAD Eyes: PERRLA, EOMI, no exophthalmos ENT: moist mucous membranes, no thyromegaly, no cervical lymphadenopathy Cardiovascular: RRR, No MRG Respiratory: CTA B Musculoskeletal: no deformities, strength intact in all 4 Skin: moist, warm, + stasis dermatitis rashes on lower legs R>L Neurological: no tremor with outstretched hands, DTR normal in all 4  PLAN:  1. Patient with longstanding hypothyroidism -Previously uncontrolled -Before last visit, she had very high TSH levels, 71 and 42 respectively, despite a high dose of levothyroxine.  I was not aware of these results, but I became aware of her TSH from 09/2020 which improved  to 13.7.  Afterwards, we discussed about the need for compliance and also advised her to crush the levothyroxine tablet, after which her TFTs normalized. - latest thyroid labs reviewed with pt. >> normal: Lab Results  Component Value Date   TSH 2.55 07/10/2021  - she continues on crushed LT4 225 mcg daily - pt feels good on this dose. - we discussed about taking the thyroid hormone every day, with water, >30 minutes before breakfast, separated by >4 hours from acid reflux medications, calcium, iron, multivitamins. Pt. is taking it correctly.  2. PCOS -She aged out of her PCOS diagnosis -However, she continues to have increased facial hair and alopecia so she continues on spironolactone 25 mg twice a day. -Reviewed latest potassium level which was normal: Lab Results  Component Value Date   K 4.1 07/10/2021  -We could not  increase the dose further due to previous hyperkalemia with higher doses  3. DM2 -She continues on metformin and SGLT2 inhibitor.  We tried to start a GLP-1 receptor agonist but she developed abdominal pressure, constipation, and abdominal pain with Ozempic.  She also had a high lipase so I was reticent to have her try it again, although she definitely would have wanted to do so.  Latest HbA1c obtained few days ago was lower, at 8.2%, but still above target. -In the last 2 months, she started to improve her diet.  She cut out fast foods, eats smaller meals throughout the day, and only eats out once a week.  When she goes out, she is trying to make better choices.  She also reduce sweet drinks, she mostly drinks diet drinks and adds monk fruit extract.  She only occasionally has half-and-half sweet tea.  She lost more than 10 pounds since last visit.  She feels that her sugars did improve afterwards.  She would like to continue with her diet and also planning to have abdominal pannus surgery, so she can be more active.  Therefore, for now, I did not suggest to change her regimen especially since her sugars checked at home in the last 2 weeks are not as high as expected from the HbA1c, a sign of improvement. - I suggested: Patient Instructions  Please continue: - Metformin ER 500 mg with breakfast - Jardiance 10 mg before breakfast  Please continue the crushed levothyroxine 225 g daily.  Take the thyroid hormone every day, with water, at least 30 minutes before breakfast, separated by at least 4 hours from: - acid reflux medications - calcium - iron - multivitamins  Continue spironolactone 25 mg twice a day.  Please return in 3 months.  4.  Obesity class III -Continue Jardiance which should also help with weight loss -She was previously also exercising in the pool but not currently -In the past, she was doing a partially keto diet with intermittent fasting 16-8 -She gained a significant  amount of weight during her COVID infection at the beginning of 2022 -She recently lost more than 10 pounds by improving diet (see above)  Philemon Kingdom, MD PhD [facility]

## 2021-07-19 NOTE — Patient Instructions (Signed)
Please continue: - Metformin ER 500 mg with breakfast - Jardiance 10 mg before breakfast  Please continue the crushed levothyroxine 225 g daily.  Take the thyroid hormone every day, with water, at least 30 minutes before breakfast, separated by at least 4 hours from: - acid reflux medications - calcium - iron - multivitamins  Continue spironolactone 25 mg twice a day.  Please return in 3 months.

## 2021-07-20 DIAGNOSIS — J449 Chronic obstructive pulmonary disease, unspecified: Secondary | ICD-10-CM | POA: Diagnosis not present

## 2021-07-30 ENCOUNTER — Other Ambulatory Visit: Payer: Self-pay

## 2021-07-30 ENCOUNTER — Encounter: Payer: Self-pay | Admitting: Plastic Surgery

## 2021-07-30 ENCOUNTER — Ambulatory Visit: Payer: Medicare Other | Admitting: Plastic Surgery

## 2021-07-30 DIAGNOSIS — F341 Dysthymic disorder: Secondary | ICD-10-CM | POA: Diagnosis not present

## 2021-07-30 DIAGNOSIS — I5032 Chronic diastolic (congestive) heart failure: Secondary | ICD-10-CM

## 2021-07-30 DIAGNOSIS — E039 Hypothyroidism, unspecified: Secondary | ICD-10-CM | POA: Diagnosis not present

## 2021-07-30 DIAGNOSIS — M793 Panniculitis, unspecified: Secondary | ICD-10-CM

## 2021-07-30 DIAGNOSIS — G629 Polyneuropathy, unspecified: Secondary | ICD-10-CM | POA: Diagnosis not present

## 2021-07-30 DIAGNOSIS — M1711 Unilateral primary osteoarthritis, right knee: Secondary | ICD-10-CM | POA: Diagnosis not present

## 2021-07-30 DIAGNOSIS — M5412 Radiculopathy, cervical region: Secondary | ICD-10-CM | POA: Diagnosis not present

## 2021-07-30 DIAGNOSIS — E1165 Type 2 diabetes mellitus with hyperglycemia: Secondary | ICD-10-CM

## 2021-07-30 DIAGNOSIS — J449 Chronic obstructive pulmonary disease, unspecified: Secondary | ICD-10-CM

## 2021-07-30 DIAGNOSIS — D751 Secondary polycythemia: Secondary | ICD-10-CM

## 2021-07-30 NOTE — Progress Notes (Signed)
Patient ID: Autumn Massey, female    DOB: Jan 27, 1966, 56 y.o.   MRN: [id number]   Chief Complaint  Patient presents with   Advice Only   Skin Problem    Is a 56 year old female here for evaluation of her abdomen.  She has been dealing with panniculitis for several years.  She is 5 feet 2 inches tall and weighs 294 pounds.  Her BMI is 53.8 kg/m2.  She has a pannus that hangs to about her knees.  It is extremely hard, heavy and indurated.  She has a lot of rashes and swelling.  She is smoking and does have diabetes.  Her last hemoglobin A1c was 8.2.  She has decreased her weight in the last 2 years from 367 pounds.  She is not willing to stop smoking.  Her past medical history is positive for spine surgery with Dr. Christella Noa.  Her past medical history includes: chronic obstructive pulmonary disease, depression, chest pain, radiculopathy, neuropathy, hypertension and diabetes.    Review of Systems  Constitutional:  Positive for activity change. Negative for appetite change.  HENT: Negative.    Eyes: Negative.   Respiratory:  Positive for shortness of breath. Negative for chest tightness.   Cardiovascular:  Negative for leg swelling.  Gastrointestinal:  Positive for abdominal pain.  Endocrine: Negative.   Genitourinary: Negative.   Musculoskeletal:  Positive for back pain, joint swelling and neck pain.  Neurological:  Negative for facial asymmetry.  Hematological: Negative.   Psychiatric/Behavioral: Negative.     Past Medical History:  Diagnosis Date   Anxiety    ANXIETY DEPRESSION 01/29/2009   04/26/2019- no current   Arthritis    Asthma    CHF (congestive heart failure) ([place])    DEPRESSION 11/19/2007   Diabetes ([place])    Dyspnea    FREQUENCY, URINARY 10/17/2009   HOH (hard of hearing)    HYPERLIPIDEMIA 11/19/2007   HYPERTENSION 11/19/2007   HYPOTHYROIDISM 11/19/2007   Morbid obesity ([place])    Neuropathy 01/26/2019   Neuropathy    PNEUMONIA 08/06/2010   Pneumonia     POLYCYTHEMIA 01/29/2009   Preventative health care 04/06/2011   Sleep apnea    mild sleep apnea, on o2 at 2l at nighttime   [place] CCE 02/08/2009   Venous insufficiency 07/19/2012    Past Surgical History:  Procedure Laterality Date   ANTERIOR CERVICAL CORPECTOMY N/A 06/19/2021   Procedure: Cerivcal Six Corpectomy;  Surgeon: Ashok Pall, MD;  Location: [place];  Service: Neurosurgery;  Laterality: N/A;   DILITATION & CURRETTAGE/HYSTROSCOPY WITH NOVASURE ABLATION N/A 04/28/2019   Procedure: DILATATION & CURETTAGE/HYSTEROSCOPY WITH NOVASURE ABLATION;  Surgeon: Molli Posey, MD;  Location: [place];  Service: Gynecology;  Laterality: N/A;  [employer] rep will be here confirmed on 04/20/19   TONSILLECTOMY     ULNAR TUNNEL RELEASE Left 02/22/2013   Procedure: LEFT ULNAR NERVE DECOMPRESSION ;  Surgeon: Tennis Must, MD;  Location: [place];  Service: Orthopedics;  Laterality: Left;      Current Outpatient Medications:    Accu-Chek Softclix Lancets lancets, Use as instructed to check blood sugar once daily, Disp: 100 each, Rfl: 3   albuterol (PROAIR HFA) 108 (90 Base) MCG/ACT inhaler, 2 puffs every 6 hours as needed, Disp: 54 g, Rfl: 3   albuterol (PROVENTIL) (2.5 MG/3ML) 0.083% nebulizer solution, Take 3 mLs (2.5 mg total) by nebulization every 6 (six) hours as needed for wheezing or shortness of breath., Disp: 75 mL,  Rfl: 3   aspirin EC 81 MG tablet, Take 81 mg by mouth daily., Disp: , Rfl:    bisoprolol-hydrochlorothiazide (ZIAC) 5-6.25 MG tablet, TAKE 1 TABLET BY MOUTH  DAILY, Disp: 90 tablet, Rfl: 3   Blood Glucose Monitoring Suppl (ACCU-CHEK AVIVA PLUS) w/Device KIT, Use to check blood sugar once a day, Disp: 1 kit, Rfl: 0   Cholecalciferol (VITAMIN D3) 75 MCG (3000 UT) TABS, Take 3,000 Units by mouth daily., Disp: , Rfl:    clonazePAM (KLONOPIN) 0.5 MG tablet, Take 1 tablet (0.5 mg total) by mouth 2 (two) times daily as needed for anxiety.,  Disp: 60 tablet, Rfl: 5   cyclobenzaprine (FLEXERIL) 5 MG tablet, TAKE 1 TABLET BY MOUTH 3  TIMES DAILY AS NEEDED FOR  MUSCLE SPASM(S) (Patient taking differently: TAKE 1 TABLET BY MOUTH 3  TIMES DAILY AS NEEDED FOR  MUSCLE SPASM(S) On hold while taking taking Vicodin- patient is holding- while on vicodin- for safety.), Disp: 270 tablet, Rfl: 1   Diclofenac Sodium (PENNSAID) 2 % SOLN, Place 2 g onto the skin 2 (two) times daily. (Patient taking differently: Place 2 g onto the skin 2 (two) times daily as needed (pain).), Disp: 112 g, Rfl: 3   empagliflozin (JARDIANCE) 10 MG TABS tablet, Take 1 tablet (10 mg total) by mouth daily., Disp: 90 tablet, Rfl: 3   FLUoxetine (PROZAC) 40 MG capsule, TAKE 1 CAPSULE BY MOUTH  DAILY, Disp: 90 capsule, Rfl: 3   fluticasone furoate-vilanterol (BREO ELLIPTA) 100-25 MCG/INH AEPB, INHALE 1 PUFF INTO THE LUNGS EVERY MORNING., Disp: 90 each, Rfl: 3   furosemide (LASIX) 40 MG tablet, TAKE 1 TABLET BY MOUTH DAILY (Patient taking differently: Take 40 mg by mouth every other day.), Disp: 90 tablet, Rfl: 1   glucose blood (ACCU-CHEK AVIVA) test strip, Use to check blood sugar once a day - Aviva Plus, Disp: 100 each, Rfl: 3   HORIZANT 300 MG TBCR, TAKE 1 TABLET BY MOUTH AT  BEDTIME, Disp: 90 tablet, Rfl: 0   irbesartan (AVAPRO) 75 MG tablet, TAKE 1 TABLET BY MOUTH AT  BEDTIME, Disp: 90 tablet, Rfl: 3   Lancets Misc. (ACCU-CHEK FASTCLIX LANCET) KIT, Use to check blood sugar once a day, Disp: 1 kit, Rfl: 0   levothyroxine (SYNTHROID) 200 MCG tablet, TAKE 1 TABLET BY MOUTH EVERY DAY (Patient taking differently: Take 200 mcg by mouth daily before breakfast. Take with 25 mcg to equal 225 mcg daily), Disp: 90 tablet, Rfl: 1   levothyroxine (SYNTHROID) 25 MCG tablet, TAKE 1 TABLET BY MOUTH EVERY DAY (Patient taking differently: Take 25 mcg by mouth daily before breakfast. Take with 200 mcg to equal 225 mcg daily), Disp: 90 tablet, Rfl: 1   metFORMIN (GLUCOPHAGE-XR) 500 MG 24 hr tablet,  Take 1 tablet (500 mg total) by mouth daily with breakfast., Disp: 90 tablet, Rfl: 3   montelukast (SINGULAIR) 10 MG tablet, TAKE 1 TABLET BY MOUTH AT  BEDTIME, Disp: 90 tablet, Rfl: 3   Multiple Vitamin (MULTIVITAMIN WITH MINERALS) TABS tablet, Take 1 tablet by mouth daily., Disp: , Rfl:    nystatin cream (MYCOSTATIN), Apply 1 application topically 2 (two) times daily. (Patient taking differently: Apply 1 application topically 2 (two) times daily as needed (yeast).), Disp: 30 g, Rfl: 0   ondansetron (ZOFRAN ODT) 4 MG disintegrating tablet, Take 1 tablet (4 mg total) by mouth every 8 (eight) hours as needed for nausea or vomiting., Disp: 4 tablet, Rfl: 0   oxyCODONE (OXY IR/ROXICODONE) 5 MG immediate  release tablet, Take 5 mg by mouth 4 (four) times daily as needed., Disp: , Rfl:    OXYGEN, 2lpm with sleep only  [facility], Disp: , Rfl:    primidone (MYSOLINE) 50 MG tablet, TAKE 1 TABLET BY MOUTH IN  THE MORNING AND 3 TABLETS  BY MOUTH AT BEDTIME, Disp: 360 tablet, Rfl: 0   Semaglutide,0.25 or 0.5MG/DOS, (OZEMPIC, 0.25 OR 0.5 MG/DOSE,) 2 MG/1.5ML SOPN, Inject 0.5 mg into the skin once a week., Disp: 4.5 mL, Rfl: 3   simvastatin (ZOCOR) 40 MG tablet, TAKE 1 TABLET BY MOUTH AT  BEDTIME, Disp: 90 tablet, Rfl: 3   spironolactone (ALDACTONE) 25 MG tablet, Take 1 tablet (25 mg total) by mouth 2 (two) times daily., Disp: 180 tablet, Rfl: 3   Objective:   Vitals:   07/30/21 1343  BP: 132/81  Pulse: 79  SpO2: 93%    Physical Exam Vitals and nursing note reviewed.  Constitutional:      Appearance: Normal appearance.  Cardiovascular:     Rate and Rhythm: Normal rate.     Pulses: Normal pulses.  Pulmonary:     Effort: Pulmonary effort is normal. No respiratory distress.  Abdominal:     Palpations: Abdomen is soft.  Skin:    General: Skin is warm.     Capillary Refill: Capillary refill takes less than 2 seconds.     Coloration: Skin is not jaundiced.     Findings: No lesion.  Neurological:      Mental Status: She is alert and oriented to person, place, and time.  Psychiatric:        Mood and Affect: Mood normal.        Behavior: Behavior normal.        Thought Content: Thought content normal.    Assessment & Plan:  Morbid obesity due to excess calories (HCC)  POLYCYTHEMIA  ANXIETY DEPRESSION  Primary osteoarthritis of right knee  Cervical radiculopathy  Neuropathy  Chronic diastolic congestive heart failure (HCC)  COPD GOLD 0 / AB  Type 2 diabetes mellitus with hyperglycemia, without long-term current use of insulin (HCC)  Acquired hypothyroidism  Panniculitis  The procedure the patient selected and that was best for the patient was discussed. The risk were discussed and include but not limited to the following:  fluid accumulation, firmness of the tissue, skin loss, change in skin sensation, fat necrosis, bleeding, infection and healing delay.  There are risks of anesthesia and injury to nerves or blood vessels.  Allergic reaction to tape, suture and skin glue are possible.  There will be swelling.  Any of these can lead to the need for revisional surgery.  A breast reduction has potential to interfere with diagnostic procedures in the future.  This procedure is best done when the breast is fully developed.  Changes in the breast will continue to occur over time: pregnancy, weight gain or weigh loss.    Total time: 45 minutes. This includes time spent with the patient during the visit as well as time spent before and after the visit reviewing the chart, documenting the encounter and ordering pertinent studies. and literature emailed to the patient.   Physical therapy: Will likely need to arrange but will wait for now [facility]: Referral placed  The patient is a good candidate for a panniculectomy.  We need to get her BMI down to 40-45.  She must be 3 months of no tobacco use prior to surgery.  Her hemoglobin A1c must be below  7.  I would like to see  the patient back in June and July for further discussion.  Pictures were obtained of the patient and placed in the chart with the patient's or guardian's permission.   [place], DO

## 2021-08-20 DIAGNOSIS — J449 Chronic obstructive pulmonary disease, unspecified: Secondary | ICD-10-CM | POA: Diagnosis not present

## 2021-09-15 ENCOUNTER — Other Ambulatory Visit: Payer: Self-pay | Admitting: Internal Medicine

## 2021-09-17 DIAGNOSIS — J449 Chronic obstructive pulmonary disease, unspecified: Secondary | ICD-10-CM | POA: Diagnosis not present

## 2021-09-27 ENCOUNTER — Encounter: Payer: Self-pay | Admitting: Internal Medicine

## 2021-09-27 DIAGNOSIS — R300 Dysuria: Secondary | ICD-10-CM

## 2021-09-29 ENCOUNTER — Other Ambulatory Visit: Payer: Self-pay | Admitting: Neurology

## 2021-09-29 ENCOUNTER — Other Ambulatory Visit: Payer: Self-pay | Admitting: Internal Medicine

## 2021-09-30 NOTE — Telephone Encounter (Signed)
Enough given only until appt,with Dr.Jaffe ?

## 2021-09-30 NOTE — Telephone Encounter (Signed)
Please refill as per office routine med refill policy (all routine meds to be refilled for 3 mo or monthly (per pt preference) up to one year from last visit, then month to month grace period for 3 mo, then further med refills will have to be denied) ? ?

## 2021-10-01 NOTE — Telephone Encounter (Signed)
Ok for urine testing - I put in the orders ?

## 2021-10-02 ENCOUNTER — Other Ambulatory Visit: Payer: Self-pay

## 2021-10-02 DIAGNOSIS — E039 Hypothyroidism, unspecified: Secondary | ICD-10-CM

## 2021-10-02 MED ORDER — LEVOTHYROXINE SODIUM 25 MCG PO TABS: 25.0000 ug | ORAL_TABLET | Freq: Every day | ORAL | 3 refills | Status: DC

## 2021-10-11 ENCOUNTER — Ambulatory Visit (INDEPENDENT_AMBULATORY_CARE_PROVIDER_SITE_OTHER): Payer: Medicare Other

## 2021-10-11 ENCOUNTER — Encounter: Payer: Self-pay | Admitting: Family Medicine

## 2021-10-11 ENCOUNTER — Ambulatory Visit (INDEPENDENT_AMBULATORY_CARE_PROVIDER_SITE_OTHER): Payer: Medicare Other | Admitting: Family Medicine

## 2021-10-11 VITALS — BP 116/72 | HR 76 | Temp 97.0°F | Ht 61.0 in | Wt 294.0 lb

## 2021-10-11 DIAGNOSIS — R059 Cough, unspecified: Secondary | ICD-10-CM

## 2021-10-11 DIAGNOSIS — J45901 Unspecified asthma with (acute) exacerbation: Secondary | ICD-10-CM | POA: Diagnosis not present

## 2021-10-11 DIAGNOSIS — R0602 Shortness of breath: Secondary | ICD-10-CM | POA: Diagnosis not present

## 2021-10-11 DIAGNOSIS — R300 Dysuria: Secondary | ICD-10-CM

## 2021-10-11 DIAGNOSIS — R062 Wheezing: Secondary | ICD-10-CM | POA: Diagnosis not present

## 2021-10-11 DIAGNOSIS — R430 Anosmia: Secondary | ICD-10-CM

## 2021-10-11 DIAGNOSIS — I700 Atherosclerosis of aorta: Secondary | ICD-10-CM | POA: Diagnosis not present

## 2021-10-11 LAB — URINALYSIS, ROUTINE W REFLEX MICROSCOPIC
Bilirubin Urine: NEGATIVE
Hgb urine dipstick: NEGATIVE
Ketones, ur: NEGATIVE
Leukocytes,Ua: NEGATIVE
Nitrite: POSITIVE — AB
RBC / HPF: NONE SEEN (ref 0–?)
Specific Gravity, Urine: 1.015 (ref 1.000–1.030)
Urine Glucose: >=1000 — AB
Urobilinogen, UA: 0.2 (ref 0.0–1.0)
pH: 6 (ref 5.0–8.0)

## 2021-10-11 LAB — CBC WITH DIFFERENTIAL/PLATELET
Basophils Absolute: 0 10*3/uL (ref 0.0–0.1)
Basophils Relative: 0.4 % (ref 0.0–3.0)
Eosinophils Absolute: 0.2 10*3/uL (ref 0.0–0.7)
Eosinophils Relative: 1.6 % (ref 0.0–5.0)
HCT: 53.9 % — ABNORMAL HIGH (ref 36.0–46.0)
Hemoglobin: 17.7 g/dL — ABNORMAL HIGH (ref 12.0–15.0)
Lymphocytes Relative: 14 % (ref 12.0–46.0)
Lymphs Abs: 1.5 10*3/uL (ref 0.7–4.0)
MCHC: 32.8 g/dL (ref 30.0–36.0)
MCV: 94.5 fl (ref 78.0–100.0)
Monocytes Absolute: 0.9 10*3/uL (ref 0.1–1.0)
Monocytes Relative: 8.6 % (ref 3.0–12.0)
Neutro Abs: 8.1 10*3/uL — ABNORMAL HIGH (ref 1.4–7.7)
Neutrophils Relative %: 75.4 % (ref 43.0–77.0)
Platelets: 203 10*3/uL (ref 150.0–400.0)
RBC: 5.71 Mil/uL — ABNORMAL HIGH (ref 3.87–5.11)
RDW: 14.5 % (ref 11.5–15.5)
WBC: 10.8 10*3/uL — ABNORMAL HIGH (ref 4.0–10.5)

## 2021-10-11 LAB — POC COVID19 BINAXNOW: SARS Coronavirus 2 Ag: NEGATIVE

## 2021-10-11 MED ORDER — PREDNISONE 10 MG (21) PO TBPK: ORAL_TABLET | Freq: Every day | ORAL | 0 refills | Status: DC

## 2021-10-11 MED ORDER — AMOXICILLIN-POT CLAVULANATE 875-125 MG PO TABS: 1.0000 | ORAL_TABLET | Freq: Two times a day (BID) | ORAL | 0 refills | Status: DC

## 2021-10-11 NOTE — Patient Instructions (Signed)
Go downstairs for blood and chest XR.  ? ?Start the antibiotics and steroids today. Your blood sugars may run higher than usual because of the steroids.  ? ?If your oxygen drops below 90% in spite of treatment, call 911 or go to the closest ED.  ?

## 2021-10-11 NOTE — Progress Notes (Signed)
? ?Acute Office Visit ? ?Subjective:  ? ? Patient ID: Autumn Massey, female    DOB: 01-18-1966, 56 y.o.   MRN: [id number] ? ?Chief Complaint  ?Patient presents with  ? Wheezing  ? Cough  ?  Sore throat started Sunday and then progressively got worse on tuesday  ? Nasal Congestion  ? ? ?HPI ?Patient is in today for complaints of sore throat, nasal congestion and congested cough progressively worsening x 5 days. Uses oxygen at bedtime or prn. Is not on oxygen at the visit.  ?She has a history of CHF, COPD, asthma, HTN, OSA, DM, hx of CVA and morbid obesity. Former smoker.  ? ?States she had a negative Covid test 2 days ago but is concerned she has Covid due to loss of taste which she had the last time she had Covid.  ?No fever, chills, dizziness, chest pain, palpitations, abdominal pain, N/V/D.  ? ?Reports using her oxygen and inhalers as prescribed.  ? ? ?Past Medical History:  ?Diagnosis Date  ? Anxiety   ? ANXIETY DEPRESSION 01/29/2009  ? 04/26/2019- no current  ? Arthritis   ? Asthma   ? CHF (congestive heart failure) ([place])   ? DEPRESSION 11/19/2007  ? Diabetes ([place])   ? Dyspnea   ? FREQUENCY, URINARY 10/17/2009  ? HOH (hard of hearing)   ? HYPERLIPIDEMIA 11/19/2007  ? HYPERTENSION 11/19/2007  ? HYPOTHYROIDISM 11/19/2007  ? Morbid obesity ([place])   ? Neuropathy 01/26/2019  ? Neuropathy   ? PNEUMONIA 08/06/2010  ? Pneumonia   ? POLYCYTHEMIA 01/29/2009  ? Preventative health care 04/06/2011  ? Sleep apnea   ? mild sleep apnea, on o2 at 2l at nighttime  ? SLEEP RELATED HYPOVENTILATION/HYPOXEMIA CCE 02/08/2009  ? Venous insufficiency 07/19/2012  ? ? ?Past Surgical History:  ?Procedure Laterality Date  ? ANTERIOR CERVICAL CORPECTOMY N/A 06/19/2021  ? Procedure: Cerivcal Six Corpectomy;  Surgeon: Ashok Pall, MD;  Location: [place] [facility];  Service: Neurosurgery;  Laterality: N/A;  ? DILITATION & CURRETTAGE/HYSTROSCOPY WITH NOVASURE ABLATION N/A 04/28/2019  ? Procedure: DILATATION & CURETTAGE/HYSTEROSCOPY WITH NOVASURE  ABLATION;  Surgeon: Molli Posey, MD;  Location: [place];  Service: Gynecology;  Laterality: N/A;  [employer] rep will be here confirmed on 04/20/19  ? TONSILLECTOMY    ? ULNAR TUNNEL RELEASE Left 02/22/2013  ? Procedure: LEFT ULNAR NERVE DECOMPRESSION ;  Surgeon: Tennis Must, MD;  Location: [place];  Service: Orthopedics;  Laterality: Left;  ? ? ?Family History  ?Problem Relation Age of Onset  ? Cancer Mother   ?     melanoma   ? Heart disease Mother   ? Cancer Maternal Grandfather   ?     prostate cancer  ? Alcohol abuse Other   ? Arthritis Other   ? Hypertension Other   ? High Cholesterol Sister   ? Psoriasis Brother   ? Osteoarthritis Maternal Grandmother 35  ? Scoliosis Maternal Grandmother   ? ? ?Social History  ? ?Socioeconomic History  ? Marital status: Divorced  ?  Spouse name: Not on file  ? Number of children: 0  ? Years of education: 73  ? Highest education level: 12th grade  ?Occupational History  ? Occupation: Disabled  ?  Employer: [employer]  ?Tobacco Use  ? Smoking status: Every Day  ?  Packs/day: 0.50  ?  Years: 22.00  ?  Pack years: 11.00  ?  Types: Cigarettes  ? Smokeless tobacco: Never  ?Vaping Use  ? Vaping  Use: Never used  ?Substance and Sexual Activity  ? Alcohol use: Yes  ?  Alcohol/week: 0.0 standard drinks  ?  Comment: rare  ? Drug use: No  ? Sexual activity: Never  ?Other Topics Concern  ? Not on file  ?Social History Narrative  ? Patient is right-handed.  ? ?Social Determinants of Health  ? ?Financial Resource Strain: Not on file  ?Food Insecurity: Not on file  ?Transportation Needs: Not on file  ?Physical Activity: Not on file  ?Stress: Not on file  ?Social Connections: Not on file  ?Intimate Partner Violence: Not on file  ? ? ?Outpatient Medications Prior to Visit  ?Medication Sig Dispense Refill  ? Accu-Chek Softclix Lancets lancets Use as instructed to check blood sugar once daily 100 each 3  ? albuterol (PROAIR HFA) 108 (90 Base) MCG/ACT inhaler 2 puffs  every 6 hours as needed 54 g 3  ? albuterol (PROVENTIL) (2.5 MG/3ML) 0.083% nebulizer solution Take 3 mLs (2.5 mg total) by nebulization every 6 (six) hours as needed for wheezing or shortness of breath. 75 mL 3  ? aspirin EC 81 MG tablet Take 81 mg by mouth daily.    ? bisoprolol-hydrochlorothiazide (ZIAC) 5-6.25 MG tablet TAKE 1 TABLET BY MOUTH  DAILY 90 tablet 3  ? Blood Glucose Monitoring Suppl (ACCU-CHEK AVIVA PLUS) w/Device KIT Use to check blood sugar once a day 1 kit 0  ? BREO ELLIPTA 100-25 MCG/ACT AEPB INHALE 1 INHALATION BY  MOUTH INTO THE LUNGS IN THE MORNING 180 each 3  ? Cholecalciferol (VITAMIN D3) 75 MCG (3000 UT) TABS Take 3,000 Units by mouth daily.    ? clonazePAM (KLONOPIN) 0.5 MG tablet Take 1 tablet (0.5 mg total) by mouth 2 (two) times daily as needed for anxiety. 60 tablet 5  ? cyclobenzaprine (FLEXERIL) 5 MG tablet TAKE 1 TABLET BY MOUTH 3  TIMES DAILY AS NEEDED FOR  MUSCLE SPASM(S) (Patient taking differently: TAKE 1 TABLET BY MOUTH 3  TIMES DAILY AS NEEDED FOR  MUSCLE SPASM(S) ?On hold while taking taking Vicodin- patient is holding- while on vicodin- for safety.) 270 tablet 1  ? Diclofenac Sodium (PENNSAID) 2 % SOLN Place 2 g onto the skin 2 (two) times daily. (Patient taking differently: Place 2 g onto the skin 2 (two) times daily as needed (pain).) 112 g 3  ? empagliflozin (JARDIANCE) 10 MG TABS tablet Take 1 tablet (10 mg total) by mouth daily. 90 tablet 3  ? FLUoxetine (PROZAC) 40 MG capsule TAKE 1 CAPSULE BY MOUTH  DAILY 90 capsule 3  ? furosemide (LASIX) 40 MG tablet TAKE 1 TABLET BY MOUTH DAILY (Patient taking differently: Take 40 mg by mouth every other day.) 90 tablet 1  ? glucose blood (ACCU-CHEK AVIVA) test strip Use to check blood sugar once a day - Aviva Plus 100 each 3  ? HORIZANT 300 MG TBCR TAKE 1 TABLET BY MOUTH AT  BEDTIME 30 tablet 0  ? irbesartan (AVAPRO) 75 MG tablet TAKE 1 TABLET BY MOUTH AT  BEDTIME 90 tablet 3  ? Lancets Misc. (ACCU-CHEK FASTCLIX LANCET) KIT Use to  check blood sugar once a day 1 kit 0  ? levothyroxine (SYNTHROID) 200 MCG tablet TAKE 1 TABLET BY MOUTH  DAILY 90 tablet 1  ? levothyroxine (SYNTHROID) 25 MCG tablet Take 1 tablet (25 mcg total) by mouth daily before breakfast. Take with 200 mcg to equal 225 mcg daily 90 tablet 3  ? metFORMIN (GLUCOPHAGE-XR) 500 MG 24 hr tablet Take 1  tablet (500 mg total) by mouth daily with breakfast. 90 tablet 3  ? montelukast (SINGULAIR) 10 MG tablet TAKE 1 TABLET BY MOUTH AT  BEDTIME 90 tablet 3  ? Multiple Vitamin (MULTIVITAMIN WITH MINERALS) TABS tablet Take 1 tablet by mouth daily.    ? nystatin cream (MYCOSTATIN) Apply 1 application topically 2 (two) times daily. (Patient taking differently: Apply 1 application. topically 2 (two) times daily as needed (yeast).) 30 g 0  ? ondansetron (ZOFRAN ODT) 4 MG disintegrating tablet Take 1 tablet (4 mg total) by mouth every 8 (eight) hours as needed for nausea or vomiting. 4 tablet 0  ? oxyCODONE (OXY IR/ROXICODONE) 5 MG immediate release tablet Take 5 mg by mouth 4 (four) times daily as needed.    ? OXYGEN 2lpm with sleep only  ?[facility]    ? primidone (MYSOLINE) 50 MG tablet TAKE 1 TABLET BY MOUTH IN  THE MORNING AND 3 TABLETS  BY MOUTH AT BEDTIME 360 tablet 0  ? Semaglutide,0.25 or 0.5MG/DOS, (OZEMPIC, 0.25 OR 0.5 MG/DOSE,) 2 MG/1.5ML SOPN Inject 0.5 mg into the skin once a week. 4.5 mL 3  ? simvastatin (ZOCOR) 40 MG tablet TAKE 1 TABLET BY MOUTH AT  BEDTIME 90 tablet 3  ? spironolactone (ALDACTONE) 25 MG tablet TAKE 1 TABLET BY MOUTH  TWICE DAILY 180 tablet 1  ? ?No facility-administered medications prior to visit.  ? ? ?Allergies  ?Allergen Reactions  ? Cymbalta [Duloxetine Hcl]   ?  Altered mental state / agitated   ? ? ?Review of Systems ?Pertinent positives and negatives in the history of present illness. ? ?   ?Objective:  ?  ?Physical Exam ? ?BP 116/72 (BP Location: Left Arm, Patient Position: Sitting, Cuff Size: Large)   Pulse 76   Temp (!) 97 ?F (36.1 ?C) (Temporal)   Ht 5'  1" (1.549 m)   Wt 294 lb (133.4 kg)   SpO2 90% Comment: without oxygen  BMI 55.55 kg/m?  ?Wt Readings from Last 3 Encounters:  ?10/11/21 294 lb (133.4 kg)  ?07/30/21 294 lb 6.4 oz (133.5 kg)  ?07/19/21 296 lb 12

## 2021-10-13 LAB — URINE CULTURE

## 2021-10-17 ENCOUNTER — Telehealth: Payer: Self-pay

## 2021-10-17 NOTE — Telephone Encounter (Signed)
Appt scheduled to see her PCP on Monday 4/24 ?

## 2021-10-17 NOTE — Telephone Encounter (Signed)
Pt called the office because she is concerned about her wheezing and cough still not being resolved from her visit on 4/14. She has finished the prednisone but still has 4 pills left of the antibiotic and states that even though she feels maybe a little bit better, the wheezing and cough still have not stopped. She states at night she is up every 2-3 hours coughing and is coughing up thick green sputum. Pt is wondering if there is anything else she needs to do or if Vickie recommends something else to try as she wants to avoid having to go to the hospital.  ?

## 2021-10-18 DIAGNOSIS — J449 Chronic obstructive pulmonary disease, unspecified: Secondary | ICD-10-CM | POA: Diagnosis not present

## 2021-10-21 ENCOUNTER — Ambulatory Visit (INDEPENDENT_AMBULATORY_CARE_PROVIDER_SITE_OTHER): Payer: Medicare Other

## 2021-10-21 ENCOUNTER — Encounter: Payer: Self-pay | Admitting: Internal Medicine

## 2021-10-21 ENCOUNTER — Ambulatory Visit (INDEPENDENT_AMBULATORY_CARE_PROVIDER_SITE_OTHER): Payer: Medicare Other | Admitting: Internal Medicine

## 2021-10-21 ENCOUNTER — Ambulatory Visit: Payer: Medicare Other | Admitting: Internal Medicine

## 2021-10-21 VITALS — BP 120/66 | HR 90 | Ht 61.0 in | Wt 292.8 lb

## 2021-10-21 VITALS — BP 126/74 | HR 82 | Temp 97.8°F | Ht 61.0 in | Wt 292.0 lb

## 2021-10-21 DIAGNOSIS — R051 Acute cough: Secondary | ICD-10-CM

## 2021-10-21 DIAGNOSIS — N760 Acute vaginitis: Secondary | ICD-10-CM | POA: Diagnosis not present

## 2021-10-21 DIAGNOSIS — E282 Polycystic ovarian syndrome: Secondary | ICD-10-CM

## 2021-10-21 DIAGNOSIS — E7800 Pure hypercholesterolemia, unspecified: Secondary | ICD-10-CM | POA: Diagnosis not present

## 2021-10-21 DIAGNOSIS — E039 Hypothyroidism, unspecified: Secondary | ICD-10-CM | POA: Diagnosis not present

## 2021-10-21 DIAGNOSIS — E559 Vitamin D deficiency, unspecified: Secondary | ICD-10-CM | POA: Diagnosis not present

## 2021-10-21 DIAGNOSIS — J441 Chronic obstructive pulmonary disease with (acute) exacerbation: Secondary | ICD-10-CM | POA: Diagnosis not present

## 2021-10-21 DIAGNOSIS — E1165 Type 2 diabetes mellitus with hyperglycemia: Secondary | ICD-10-CM

## 2021-10-21 DIAGNOSIS — R21 Rash and other nonspecific skin eruption: Secondary | ICD-10-CM | POA: Diagnosis not present

## 2021-10-21 DIAGNOSIS — J9811 Atelectasis: Secondary | ICD-10-CM | POA: Diagnosis not present

## 2021-10-21 LAB — POCT GLYCOSYLATED HEMOGLOBIN (HGB A1C): Hemoglobin A1C: 8.4 % — AB (ref 4.0–5.6)

## 2021-10-21 LAB — POC SOFIA SARS ANTIGEN FIA: SARS Coronavirus 2 Ag: NEGATIVE

## 2021-10-21 MED ORDER — AZITHROMYCIN 250 MG PO TABS
ORAL_TABLET | ORAL | 1 refills | Status: AC
Start: 2021-10-21 — End: 2021-10-26

## 2021-10-21 MED ORDER — METHYLPREDNISOLONE ACETATE 80 MG/ML IJ SUSP
80.0000 mg | Freq: Once | INTRAMUSCULAR | Status: AC
  Administered 2021-10-21: 80 mg via INTRAMUSCULAR

## 2021-10-21 MED ORDER — OZEMPIC (0.25 OR 0.5 MG/DOSE) 2 MG/1.5ML ~~LOC~~ SOPN: 0.5000 mg | PEN_INJECTOR | SUBCUTANEOUS | 3 refills | Status: DC

## 2021-10-21 MED ORDER — EMPAGLIFLOZIN 25 MG PO TABS: 25.0000 mg | ORAL_TABLET | Freq: Every day | ORAL | 3 refills | Status: DC

## 2021-10-21 MED ORDER — PREDNISONE 10 MG PO TABS: ORAL_TABLET | ORAL | 0 refills | Status: DC

## 2021-10-21 MED ORDER — FLUCONAZOLE 150 MG PO TABS: ORAL_TABLET | ORAL | 1 refills | Status: DC

## 2021-10-21 MED ORDER — NYSTATIN 100000 UNIT/GM EX CREA: 1.0000 "application " | TOPICAL_CREAM | Freq: Two times a day (BID) | CUTANEOUS | 2 refills | Status: DC

## 2021-10-21 NOTE — Patient Instructions (Addendum)
Please continue: ?- Metformin ER 500 mg with breakfast ? ?Increase: ?- Jardiance 25 mg before breakfast ? ?Please start: ?- Ozempic 0.25 mg weekly in a.m. (for example on Sunday morning) x 2-3 weeks, then increase to 0.5 mg weekly in a.m. if no nausea or hypoglycemia. ? ?Please continue the crushed levothyroxine 225 ?g daily. ? ?Take the thyroid hormone every day, with water, at least 30 minutes before breakfast, separated by at least 4 hours from: ?- acid reflux medications ?- calcium ?- iron ?- multivitamins ? ?Continue spironolactone 25 mg twice a day. ? ?Please return in 3 months. ?

## 2021-10-21 NOTE — Progress Notes (Signed)
Patient ID: Autumn Massey, female   DOB: April 13, 1966, 56 y.o.   MRN: [id number] ? ? ? ?    Chief Complaint: follow up cough, wheezing, uti, rash, yeast vaginitis  ? ?     HPI:  Autumn Massey is a 56 y.o. female Here with acute onset mild to mod 8 days ST, HA, general weakness and malaise, with prod cough greenish sputum, but Pt denies chest pain, increased sob or doe, wheezing, orthopnea, PND, increased LE swelling, palpitations, dizziness or syncope, except for onset mild wheezing  sob/doe x 2-3 days wosrening.  Denies urinary symptoms such as dysuria, frequency, urgency, flank pain, hematuria or n/v, fever, chills.  Does have groin rash with itchiness better with nystatin cream in past, asks for refill.  Also has worsening vaginal d/c symptoms sugestive of yeast as well for 2 wks.  Has not been using albuterol bc wasn't sure if this was as severe as what they meant by "take prn emergencies".  Still using home o2 2L qhs only, but has had noted exertional desat to 88 percent yesterday.  Finished recent antibx and prednisone per UC but symptoms seem worsening again .   ?      ?Wt Readings from Last 3 Encounters:  ?10/21/21 292 lb 12.8 oz (132.8 kg)  ?10/21/21 292 lb (132.5 kg)  ?10/11/21 294 lb (133.4 kg)  ? ?BP Readings from Last 3 Encounters:  ?10/21/21 120/66  ?10/21/21 126/74  ?10/11/21 116/72  ? ?      ?Past Medical History:  ?Diagnosis Date  ? Anxiety   ? ANXIETY DEPRESSION 01/29/2009  ? 04/26/2019- no current  ? Arthritis   ? Asthma   ? CHF (congestive heart failure) ([place])   ? DEPRESSION 11/19/2007  ? Diabetes ([place])   ? Dyspnea   ? FREQUENCY, URINARY 10/17/2009  ? HOH (hard of hearing)   ? HYPERLIPIDEMIA 11/19/2007  ? HYPERTENSION 11/19/2007  ? HYPOTHYROIDISM 11/19/2007  ? Morbid obesity ([place])   ? Neuropathy 01/26/2019  ? Neuropathy   ? PNEUMONIA 08/06/2010  ? Pneumonia   ? POLYCYTHEMIA 01/29/2009  ? Preventative health care 04/06/2011  ? Sleep apnea   ? mild sleep apnea, on o2 at 2l at nighttime  ? SLEEP  RELATED HYPOVENTILATION/HYPOXEMIA CCE 02/08/2009  ? Venous insufficiency 07/19/2012  ? ?Past Surgical History:  ?Procedure Laterality Date  ? ANTERIOR CERVICAL CORPECTOMY N/A 06/19/2021  ? Procedure: Cerivcal Six Corpectomy;  Surgeon: Ashok Pall, MD;  Location: [place];  Service: Neurosurgery;  Laterality: N/A;  ? DILITATION & CURRETTAGE/HYSTROSCOPY WITH NOVASURE ABLATION N/A 04/28/2019  ? Procedure: DILATATION & CURETTAGE/HYSTEROSCOPY WITH NOVASURE ABLATION;  Surgeon: Molli Posey, MD;  Location: [place];  Service: Gynecology;  Laterality: N/A;  [employer] rep will be here confirmed on 04/20/19  ? TONSILLECTOMY    ? ULNAR TUNNEL RELEASE Left 02/22/2013  ? Procedure: LEFT ULNAR NERVE DECOMPRESSION ;  Surgeon: Tennis Must, MD;  Location: [facility];  Service: Orthopedics;  Laterality: Left;  ? ? reports that she has been smoking cigarettes. She has a 11.00 pack-year smoking history. She has never used smokeless tobacco. She reports current alcohol use. She reports that she does not use drugs. ?family history includes Alcohol abuse in an other family member; Arthritis in an other family member; Cancer in her maternal grandfather and mother; Heart disease in her mother; High Cholesterol in her sister; Hypertension in an other family member; Osteoarthritis (age of onset: 57) in her maternal grandmother; Psoriasis in her brother;  Scoliosis in her maternal grandmother. ?Allergies  ?Allergen Reactions  ? Cymbalta [Duloxetine Hcl]   ?  Altered mental state / agitated   ? ?Current Outpatient Medications on File Prior to Visit  ?Medication Sig Dispense Refill  ? Accu-Chek Softclix Lancets lancets Use as instructed to check blood sugar once daily 100 each 3  ? albuterol (PROAIR HFA) 108 (90 Base) MCG/ACT inhaler 2 puffs every 6 hours as needed 54 g 3  ? albuterol (PROVENTIL) (2.5 MG/3ML) 0.083% nebulizer solution Take 3 mLs (2.5 mg total) by nebulization every 6 (six) hours as needed for wheezing or shortness  of breath. 75 mL 3  ? aspirin EC 81 MG tablet Take 81 mg by mouth daily.    ? bisoprolol-hydrochlorothiazide (ZIAC) 5-6.25 MG tablet TAKE 1 TABLET BY MOUTH  DAILY 90 tablet 3  ? Blood Glucose Monitoring Suppl (ACCU-CHEK AVIVA PLUS) w/Device KIT Use to check blood sugar once a day 1 kit 0  ? BREO ELLIPTA 100-25 MCG/ACT AEPB INHALE 1 INHALATION BY  MOUTH INTO THE LUNGS IN THE MORNING 180 each 3  ? Cholecalciferol (VITAMIN D3) 75 MCG (3000 UT) TABS Take 3,000 Units by mouth daily.    ? clonazePAM (KLONOPIN) 0.5 MG tablet Take 1 tablet (0.5 mg total) by mouth 2 (two) times daily as needed for anxiety. 60 tablet 5  ? cyclobenzaprine (FLEXERIL) 5 MG tablet TAKE 1 TABLET BY MOUTH 3  TIMES DAILY AS NEEDED FOR  MUSCLE SPASM(S) (Patient taking differently: TAKE 1 TABLET BY MOUTH 3  TIMES DAILY AS NEEDED FOR  MUSCLE SPASM(S) ?On hold while taking taking Vicodin- patient is holding- while on vicodin- for safety.) 270 tablet 1  ? Diclofenac Sodium (PENNSAID) 2 % SOLN Place 2 g onto the skin 2 (two) times daily. (Patient taking differently: Place 2 g onto the skin 2 (two) times daily as needed (pain).) 112 g 3  ? FLUoxetine (PROZAC) 40 MG capsule TAKE 1 CAPSULE BY MOUTH  DAILY 90 capsule 3  ? furosemide (LASIX) 40 MG tablet TAKE 1 TABLET BY MOUTH DAILY (Patient taking differently: Take 40 mg by mouth every other day.) 90 tablet 1  ? glucose blood (ACCU-CHEK AVIVA) test strip Use to check blood sugar once a day - Aviva Plus 100 each 3  ? HORIZANT 300 MG TBCR TAKE 1 TABLET BY MOUTH AT  BEDTIME 30 tablet 0  ? irbesartan (AVAPRO) 75 MG tablet TAKE 1 TABLET BY MOUTH AT  BEDTIME 90 tablet 3  ? Lancets Misc. (ACCU-CHEK FASTCLIX LANCET) KIT Use to check blood sugar once a day 1 kit 0  ? levothyroxine (SYNTHROID) 200 MCG tablet TAKE 1 TABLET BY MOUTH  DAILY 90 tablet 1  ? levothyroxine (SYNTHROID) 25 MCG tablet Take 1 tablet (25 mcg total) by mouth daily before breakfast. Take with 200 mcg to equal 225 mcg daily 90 tablet 3  ? metFORMIN  (GLUCOPHAGE-XR) 500 MG 24 hr tablet Take 1 tablet (500 mg total) by mouth daily with breakfast. 90 tablet 3  ? montelukast (SINGULAIR) 10 MG tablet TAKE 1 TABLET BY MOUTH AT  BEDTIME 90 tablet 3  ? Multiple Vitamin (MULTIVITAMIN WITH MINERALS) TABS tablet Take 1 tablet by mouth daily.    ? ondansetron (ZOFRAN ODT) 4 MG disintegrating tablet Take 1 tablet (4 mg total) by mouth every 8 (eight) hours as needed for nausea or vomiting. 4 tablet 0  ? oxyCODONE (OXY IR/ROXICODONE) 5 MG immediate release tablet Take 5 mg by mouth 4 (four) times daily as  needed.    ? OXYGEN 2lpm with sleep only  ?[facility]    ? primidone (MYSOLINE) 50 MG tablet TAKE 1 TABLET BY MOUTH IN  THE MORNING AND 3 TABLETS  BY MOUTH AT BEDTIME 360 tablet 0  ? simvastatin (ZOCOR) 40 MG tablet TAKE 1 TABLET BY MOUTH AT  BEDTIME 90 tablet 3  ? spironolactone (ALDACTONE) 25 MG tablet TAKE 1 TABLET BY MOUTH  TWICE DAILY 180 tablet 1  ? empagliflozin (JARDIANCE) 25 MG TABS tablet Take 1 tablet (25 mg total) by mouth daily before breakfast. 90 tablet 3  ? Semaglutide,0.25 or 0.5MG/DOS, (OZEMPIC, 0.25 OR 0.5 MG/DOSE,) 2 MG/1.5ML SOPN Inject 0.5 mg into the skin once a week. 4.5 mL 3  ? ?No current facility-administered medications on file prior to visit.  ? ?     ROS:  All others reviewed and negative. ? ?Objective  ? ?     PE:  BP 126/74 (BP Location: Left Arm, Patient Position: Sitting, Cuff Size: Large)   Pulse 82   Temp 97.8 ?F (36.6 ?C) (Oral)   Ht '5\' 1"'  (1.549 m)   Wt 292 lb (132.5 kg)   SpO2 90%   BMI 55.17 kg/m?  ? ?              Constitutional: Pt appears in NAD, mild ill ?              HENT: Head: NCAT.  ?              Right Ear: External ear normal.   ?              Left Ear: External ear normal. Bilat tm's with mild erythema.  Max sinus areas non tender.  Pharynx with mild erythema, no exudate ?              Eyes: . Pupils are equal, round, and reactive to light. Conjunctivae and EOM are normal ?              Nose: without d/c or deformity ?               Neck: Neck supple. Gross normal ROM ?              Cardiovascular: Normal rate and regular rhythm.   ?              Pulmonary/Chest: Effort normal and breath sounds decreased without rales but with fe

## 2021-10-21 NOTE — Patient Instructions (Signed)
Your COVID test was done today ? ?You had the steroid shot today ? ?Please take all new medication as prescribed  - the antibiotic, prednisone, and diflucan ? ?Please use the albuterol up to 4 times per day until you feel better, as this is what it is for ? ?Please continue all other medications as before, and refills have been done if requested - the nystatin cream ? ?Please have the pharmacy call with any other refills you may need. ? ?Please keep your appointments with your specialists as you may have planned ? ?Please go to the XRAY Department in the first floor for the x-ray testing ? ?You will be contacted by phone if any changes need to be made immediately.  Otherwise, you will receive a letter about your results with an explanation, but please check with MyChart first. ? ?Please remember to sign up for MyChart if you have not done so, as this will be important to you in the future with finding out test results, communicating by private email, and scheduling acute appointments online when needed. ? ? ? ? ? ?

## 2021-10-21 NOTE — Assessment & Plan Note (Signed)
Last vitamin D Lab Results  Component Value Date   VD25OH 35.27 07/10/2021   Low, to start oral replacement  

## 2021-10-21 NOTE — Progress Notes (Signed)
Patient ID: Autumn Massey, female   DOB: [birth year], 56 y.o.   MRN: [id number] ? ?This visit occurred during the SARS-CoV-2 public health emergency.  Safety protocols were in place, including screening questions prior to the visit, additional usage of staff PPE, and extensive cleaning of exam room while observing appropriate contact time as indicated for disinfecting solutions.  ? ?HPI  ?Autumn Massey is a 56 y.o.-year-old female, returning for f/u for DM2, hypothyroidism, and PCOS. Last visit 3 months ago. ? ?Interim history: ?She was referred to a plastic surgeon to remove her abdominal pannus.  She needs to lose weight before the surgery, Autumn Massey BMI lower than 45.  Also, an HbA1c has to be lower than 8%.  She was offered gastric bypass, but she refused. ?Before last visit, cut out fast foods, eats 4-5 small meals a day. She lost 12 lbs. She stopped artificial sweetener. Uses Monk fruit. She has bronchitis - has been on Prednisone taper and had a steroid injection today. She did not check CBGs during the tapers. ? ?PCOS: ?Reviewed history: ? - weight gain: Maximal weight 364 ? - was on Phentermine, which worked initially, then stopped working ? - Would consider gastric bypass but only as a last resort ?- + hirsutism >> started to see facial hair in her 30s ?- + hair loss >> female pattern, and bald spot in vertex ?- No acne ?- had a Mirena IUD placed 04/2013 for heavy menstrual cycles >> out now; no menses since IUD out ?- has frequent Prednisone tapers   ? ?She was previously on spironolactone 50 mg twice daily but her potassium increased so we had to decrease the dose to 25 mg twice daily.  We ended up stopping spironolactone 11/2016.  However, she started to have more facial hair and more frontal balding so we restarted spironolactone 25 mg twice a day. ? ?Her potassium level is normal now: ?Lab Results  ?Component Value Date  ? K 4.1 07/10/2021  ? ?Hypothyroidism: ?Pt. has been dx with hypothyroidism in  2005. ? ?Pt is on levothyroxine (crushed) 225 mcg daily (increased at last visit), taken: ?- in am ?- fasting ?- at least 30 min from b'fast ?- no Ca, Fe ?- + MVI 2h later! >> moved >4h later ?- no PPIs ?- not on Biotin ? ?Reviewed her TFTs: ?Lab Results  ?Component Value Date  ? TSH 2.55 07/10/2021  ? TSH 4.06 03/15/2021  ? TSH 21.97 (H) 12/06/2020  ? TSH 13.74 (H) 10/03/2020  ? TSH 42.400 (H) 09/19/2020  ? TSH 71.60 (H) 01/27/2020  ? TSH 0.80 10/20/2019  ? TSH 0.19 (L) 03/01/2019  ? TSH 0.16 (L) 01/26/2019  ? TSH 0.65 07/26/2018  ? FREET4 1.02 03/15/2021  ? FREET4 0.70 12/06/2020  ? FREET4 0.90 10/03/2020  ? FREET4 0.68 (L) 09/19/2020  ? FREET4 1.17 10/20/2019  ? FREET4 1.23 03/01/2019  ? FREET4 1.21 07/26/2018  ? FREET4 0.97 04/30/2018  ? FREET4 1.32 03/02/2018  ? FREET4 1.25 05/05/2017  ?  ?Pt denies: ?- feeling nodules in neck ?- hoarseness ?- dysphagia ?- choking ?- SOB with lying down ? ?She was on Neurontin for tremors, but had to decrease the dose because of somnolence and then she was able to stop. ?She is on primidone >> restarted by Dr. Tomi Likens.  She was on Lyrica at night >> switched to Gabapentin. ? ?DM2: ?Reviewed HbA1c levels: ?Lab Results  ?Component Value Date  ? HGBA1C 8.2 (H) 07/10/2021  ? HGBA1C 8.4 (  H) 06/17/2021  ? HGBA1C 8.3 (A) 04/08/2021  ?04/26/2019: HbA1c calculated from fructosamine is 5.4% ?03/09/2018: HbA1c calculated from fructosamine is better, at 5.5%! ?09/02/2017: HbA1c calculated from Fructosamine is 6.1%. ?05/05/2017: HbA1c calculated from Fructosamine is 6.00%. ?03/10/2017: HbA1c calculated from Fructosamine is 6.17% (great!). ?11/2016: HbA1c calculated from the fructosamine is radically better, at 5.77%! ? ?She is on: ?- Metformin 1000 mg with dinner >> 500 mg with dinner >> in a.m. >> at night >> in am ?- Jardiance 10 mg in the pm -  started 02/2017 >> initially stopped b/c UTI and also had a tear on her labia >> resolved.  We restarted Jardiance in 02/2018 and she is tolerating it  well now ?- - off now (see above) ? Not taking Januvia >> not covered. ?She was previously on glipizide. ? ?She just started to check sugars:  ?- am: 131 >> n/c >> 108, 120-130s, 147 >> 120s >> 123, 141 >> 110-132 ?- after b'fast: 153 ?- lunch: n/c ?- after lunch: 145-150 >> 142 >> 131-152 >> n/c ?- dinner: n/c >> 120-140, 157 >> n/c ?- after dinner: 108 >> 150-160, 170 >> 149-170 >> 173-180 >> 149-173 ?Highest CBG: 188. ? ?She is due for eye exam. ? ?No CKD: ?Lab Results  ?Component Value Date  ? BUN 11 07/10/2021  ? ?Lab Results  ?Component Value Date  ? CREATININE 0.64 07/10/2021  ? ?+ HL: ?Lab Results  ?Component Value Date  ? CHOL 160 07/10/2021  ? HDL 34.20 (L) 07/10/2021  ? LDLCALC 109 (H) 01/27/2020  ? LDLDIRECT 92.0 07/10/2021  ? TRIG 263.0 (H) 07/10/2021  ? CHOLHDL 5 07/10/2021  ? ?She quit smoking 05/2015. ? ?She is on weekly ergocalciferol. ? ?She had a D&C on 04/28/2019. ? ?ROS: ?+ See HPI ?+ Tremors  ? ?I reviewed pt's medications, allergies, PMH, social hx, family hx, and changes were documented in the history of present illness. Otherwise, unchanged from my initial visit note. ? ?She does not have a family history of medullary thyroid cancer or personal history of pancreatitis. ? ?Past Medical History:  ?Diagnosis Date  ? Anxiety   ? ANXIETY DEPRESSION 01/29/2009  ? 04/26/2019- no current  ? Arthritis   ? Asthma   ? CHF (congestive heart failure) ([place])   ? DEPRESSION 11/19/2007  ? Diabetes ([place])   ? Dyspnea   ? FREQUENCY, URINARY 10/17/2009  ? HOH (hard of hearing)   ? HYPERLIPIDEMIA 11/19/2007  ? HYPERTENSION 11/19/2007  ? HYPOTHYROIDISM 11/19/2007  ? Morbid obesity ([place])   ? Neuropathy 01/26/2019  ? Neuropathy   ? PNEUMONIA 08/06/2010  ? Pneumonia   ? POLYCYTHEMIA 01/29/2009  ? Preventative health care 04/06/2011  ? Sleep apnea   ? mild sleep apnea, on o2 at 2l at nighttime  ? SLEEP RELATED HYPOVENTILATION/HYPOXEMIA CCE 02/08/2009  ? Venous insufficiency 07/19/2012  ? ?Past Surgical History:   ?Procedure Laterality Date  ? ANTERIOR CERVICAL CORPECTOMY N/A 06/19/2021  ? Procedure: Cerivcal Six Corpectomy;  Surgeon: Ashok Pall, MD;  Location: [place];  Service: Neurosurgery;  Laterality: N/A;  ? DILITATION & CURRETTAGE/HYSTROSCOPY WITH NOVASURE ABLATION N/A 04/28/2019  ? Procedure: DILATATION & CURETTAGE/HYSTEROSCOPY WITH NOVASURE ABLATION;  Surgeon: Molli Posey, MD;  Location: [place];  Service: Gynecology;  Laterality: N/A;  [employer] rep will be here confirmed on 04/20/19  ? TONSILLECTOMY    ? ULNAR TUNNEL RELEASE Left 02/22/2013  ? Procedure: LEFT ULNAR NERVE DECOMPRESSION ;  Surgeon: Tennis Must, MD;  Location: [facility];  Service: Orthopedics;  Laterality: Left;  ? ?Social History  ? ?Socioeconomic History  ? Marital status: Divorced  ?  Spouse name: Not on file  ? Number of children: 0  ? Years of education: 102  ? Highest education level: 12th grade  ?Occupational History  ? Occupation: Disabled  ?  Employer: [employer]  ?Tobacco Use  ? Smoking status: Every Day  ?  Packs/day: 0.50  ?  Years: 22.00  ?  Pack years: 11.00  ?  Types: Cigarettes  ? Smokeless tobacco: Never  ?Vaping Use  ? Vaping Use: Never used  ?Substance and Sexual Activity  ? Alcohol use: Yes  ?  Alcohol/week: 0.0 standard drinks  ?  Comment: rare  ? Drug use: No  ? Sexual activity: Never  ?Other Topics Concern  ? Not on file  ?Social History Narrative  ? Patient is right-handed.  ? ?Social Determinants of Health  ? ?Financial Resource Strain: Not on file  ?Food Insecurity: Not on file  ?Transportation Needs: Not on file  ?Physical Activity: Not on file  ?Stress: Not on file  ?Social Connections: Not on file  ?Intimate Partner Violence: Not on file  ? ?Current Outpatient Medications on File Prior to Visit  ?Medication Sig Dispense Refill  ? Accu-Chek Softclix Lancets lancets Use as instructed to check blood sugar once daily 100 each 3  ? albuterol (PROAIR HFA) 108 (90 Base) MCG/ACT inhaler 2 puffs every 6  hours as needed 54 g 3  ? albuterol (PROVENTIL) (2.5 MG/3ML) 0.083% nebulizer solution Take 3 mLs (2.5 mg total) by nebulization every 6 (six) hours as needed for wheezing or shortness of breath. 75 mL 3  ? a

## 2021-10-23 ENCOUNTER — Encounter: Payer: Self-pay | Admitting: Internal Medicine

## 2021-10-23 NOTE — Assessment & Plan Note (Signed)
Mild to mod, for cxr, depomedrol im 80, prednisone asd,,  to f/u any worsening symptoms or concerns ?

## 2021-10-23 NOTE — Assessment & Plan Note (Signed)
For diflucan asd,  to f/u any worsening symptoms or concerns 

## 2021-10-23 NOTE — Assessment & Plan Note (Signed)
Lab Results  ?Component Value Date  ? LDLCALC 109 (H) 01/27/2020  ? ?Uncontrolled, goal ldl < 70,, pt to continue current statin zocor 40 as delcines change for now, for better DM diet with lower chol ? ?

## 2021-10-23 NOTE — Assessment & Plan Note (Signed)
Bilateral groin, for nystatin cream asd,  to f/u any worsening symptoms or concerns ?

## 2021-10-23 NOTE — Assessment & Plan Note (Signed)
Mild to mod, cant r/o pna vs bronchitis, for cxr  for antibx course, to f/u any worsening symptoms or concerns ?

## 2021-10-23 NOTE — Assessment & Plan Note (Signed)
Lab Results  ?Component Value Date  ? HGBA1C 8.4 (A) 10/21/2021  ? ?Mild uncontrolled,, pt to continue current medical treatment metfomrin, jardiacne, ozempic and f/u endo as planned, delcines change in med ? ?

## 2021-10-26 ENCOUNTER — Other Ambulatory Visit: Payer: Self-pay | Admitting: Internal Medicine

## 2021-10-26 ENCOUNTER — Encounter: Payer: Self-pay | Admitting: Internal Medicine

## 2021-10-26 DIAGNOSIS — E1165 Type 2 diabetes mellitus with hyperglycemia: Secondary | ICD-10-CM

## 2021-10-26 NOTE — Telephone Encounter (Signed)
Please refill as per office routine med refill policy (all routine meds to be refilled for 3 mo or monthly (per pt preference) up to one year from last visit, then month to month grace period for 3 mo, then further med refills will have to be denied) ? ?

## 2021-10-28 ENCOUNTER — Other Ambulatory Visit: Payer: Self-pay | Admitting: Internal Medicine

## 2021-10-28 DIAGNOSIS — E1165 Type 2 diabetes mellitus with hyperglycemia: Secondary | ICD-10-CM

## 2021-10-28 MED ORDER — GLUCOSE BLOOD VI STRP: ORAL_STRIP | 3 refills | Status: DC

## 2021-10-28 MED ORDER — ACCU-CHEK AVIVA PLUS W/DEVICE KIT: PACK | 0 refills | Status: DC

## 2021-10-28 MED ORDER — ACCU-CHEK GUIDE VI STRP: ORAL_STRIP | 12 refills | Status: DC

## 2021-10-28 NOTE — Progress Notes (Deleted)
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 Follow up 9 months.  Subjective:  Autumn Massey a 55 29ar old right-handed woman with hypothyroidism, hyperlipidemia, diabetes, hypertension, hypoventilation, cerebrovascular disease and former smoker who follows up for essential tremor.   UPDATE: Current medication: primidone 80m61m AM and 180mg78mbedtime.  Other medications include Horizant 300mg 107medtime for neuropathic pain.   Tremors are stable.  She had a wound in the right leg which has since healed. However the neuropathy has been aggravated I- not pain but weird sensation in that right foot, particularly the pad of her big toe.     Ulnar neuropathy aggravated if touches the last 2 fingers.     HISTORY:  She underwent left ulnar decompression surgery in August 2014.  When the cast was removed the following month, she noted twitching of her fifth digit.  An MRI of the cervical spine was performed in December 2014, which revealed no structural etiology for radiculopathy.  She had a NCV-EMG in January 2015 which was essentially normal.  She continued to have numbness, paresthesias and weakness of the fourth and fifth digits of the left hand.  She later developed tremor in both hands, more noticeable in the right hand since she is right-handed dominant.  It occurs when she is writing or holding utensils.  She denies gait difficulty, rigidity, lack of sense of smell, or history consistent with REM sleep behavior.  She is concerned about Parkinson's disease because her maternal grandfather and uncle had it.  Another uncle had tremor.   She has numbness and tingling in the toes and fingers.  She also notes electric pain in the  right leg below the knee to the foot.  For neuropathic pain, she has tried gabapentin, Cymbalta, and Lyrica.  PAST MEDICAL HISTORY: Past Medical History:  Diagnosis Date   Anxiety    ANXIETY DEPRESSION 01/29/2009   04/26/2019- no current   Arthritis    Asthma    CHF (congestive heart failure) (HCC)  [place] 11/19/2007   Diabetes (HCC)  [place]pnea    FREQUENCY, URINARY 10/17/2009   HOH (hard of hearing)    HYPERLIPIDEMIA 11/19/2007   HYPERTENSION 11/19/2007   HYPOTHYROIDISM 11/19/2007   Morbid obesity (HCC)  [place]uropathy 01/26/2019   Neuropathy    PNEUMONIA 08/06/2010   Pneumonia    POLYCYTHEMIA 01/29/2009   Preventative health care 04/06/2011   Sleep apnea    mild sleep apnea, on o2 at 2l at nighttime   SLEEP [place]8/05/2009   Venous insufficiency 07/19/2012    MEDICATIONS: Current Outpatient Medications on File Prior to Visit  Medication Sig Dispense Refill   Accu-Chek Softclix Lancets lancets Use as instructed to check blood sugar once daily 100 each 3   albuterol (PROAIR HFA) 108 (90 Base) MCG/ACT inhaler 2 puffs every 6 hours as needed 54 g 3   albuterol (PROVENTIL) (2.5 MG/3ML) 0.083% nebulizer solution Take 3 mLs (2.5 mg total) by nebulization every 6 (six) hours as needed for wheezing or shortness of breath. 75 mL 3   aspirin EC 81 MG tablet Take 81 mg by mouth daily.     bisoprolol-hydrochlorothiazide (ZIAC) 5-6.25 MG tablet TAKE 1 TABLET BY MOUTH  DAILY 90  tablet 3   Blood Glucose Monitoring Suppl (ACCU-CHEK AVIVA PLUS) w/Device KIT Use to check blood sugar once a day 1 kit 0   BREO ELLIPTA 100-25 MCG/ACT AEPB INHALE 1 INHALATION BY  MOUTH INTO THE LUNGS IN THE MORNING 180 each 3   Cholecalciferol (VITAMIN D3) 75 MCG (3000 UT) TABS Take 3,000 Units by mouth daily.     clonazePAM (KLONOPIN) 0.5 MG tablet Take 1 tablet (0.5 mg total) by mouth 2 (two) times daily as needed for anxiety. 60 tablet 5   cyclobenzaprine (FLEXERIL) 5 MG  tablet TAKE 1 TABLET BY MOUTH 3  TIMES DAILY AS NEEDED FOR  MUSCLE SPASM(S) (Patient taking differently: TAKE 1 TABLET BY MOUTH 3  TIMES DAILY AS NEEDED FOR  MUSCLE SPASM(S) On hold while taking taking Vicodin- patient is holding- while on vicodin- for safety.) 270 tablet 1   Diclofenac Sodium (PENNSAID) 2 % SOLN Place 2 g onto the skin 2 (two) times daily. (Patient taking differently: Place 2 g onto the skin 2 (two) times daily as needed (pain).) 112 g 3   empagliflozin (JARDIANCE) 25 MG TABS tablet Take 1 tablet (25 mg total) by mouth daily before breakfast. 90 tablet 3   fluconazole (DIFLUCAN) 150 MG tablet 1 tab by mouth every 3 days as needed 2 tablet 1   FLUoxetine (PROZAC) 40 MG capsule TAKE 1 CAPSULE BY MOUTH  DAILY 90 capsule 3   furosemide (LASIX) 40 MG tablet TAKE 1 TABLET BY MOUTH DAILY (Patient taking differently: Take 40 mg by mouth every other day.) 90 tablet 1   glucose blood (ACCU-CHEK AVIVA) test strip Use to check blood sugar once a day - Aviva Plus 100 each 3   HORIZANT 300 MG TBCR TAKE 1 TABLET BY MOUTH AT  BEDTIME 30 tablet 0   irbesartan (AVAPRO) 75 MG tablet TAKE 1 TABLET BY MOUTH AT  BEDTIME 90 tablet 3   Lancets Misc. (ACCU-CHEK FASTCLIX LANCET) KIT Use to check blood sugar once a day 1 kit 0   levothyroxine (SYNTHROID) 200 MCG tablet TAKE 1 TABLET BY MOUTH  DAILY 90 tablet 1   levothyroxine (SYNTHROID) 25 MCG tablet Take 1 tablet (25 mcg total) by mouth daily before breakfast. Take with 200 mcg to equal 225 mcg daily 90 tablet 3   metFORMIN (GLUCOPHAGE-XR) 500 MG 24 hr tablet Take 1 tablet (500 mg total) by mouth daily with breakfast. 90 tablet 3   montelukast (SINGULAIR) 10 MG tablet TAKE 1 TABLET BY MOUTH AT  BEDTIME 90 tablet 3   Multiple Vitamin (MULTIVITAMIN WITH MINERALS) TABS tablet Take 1 tablet by mouth daily.     nystatin cream (MYCOSTATIN) Apply 1 application. topically 2 (two) times daily. 30 g 2   ondansetron (ZOFRAN ODT) 4 MG disintegrating tablet Take 1  tablet (4 mg total) by mouth every 8 (eight) hours as needed for nausea or vomiting. 4 tablet 0   oxyCODONE (OXY IR/ROXICODONE) 5 MG immediate release tablet Take 5 mg by mouth 4 (four) times daily as needed.     OXYGEN 2lpm with sleep only  [facility]     predniSONE (DELTASONE) 10 MG tablet 3 tabs by mouth per day for 3 days,2tabs per day for 3 days,1tab per day for 3 days 18 tablet 0   primidone (MYSOLINE) 50 MG tablet TAKE 1 TABLET BY MOUTH IN  THE MORNING AND 3 TABLETS  BY MOUTH AT BEDTIME 360 tablet 0   Semaglutide,0.25 or 0.5MG/DOS, (OZEMPIC, 0.25 OR 0.5 MG/DOSE,) 2 MG/1.5ML  SOPN Inject 0.5 mg into the skin once a week. 4.5 mL 3   simvastatin (ZOCOR) 40 MG tablet TAKE 1 TABLET BY MOUTH AT  BEDTIME 90 tablet 3   spironolactone (ALDACTONE) 25 MG tablet TAKE 1 TABLET BY MOUTH  TWICE DAILY 180 tablet 1   No current facility-administered medications on file prior to visit.    ALLERGIES: Allergies  Allergen Reactions   Cymbalta [Duloxetine Hcl]     Altered mental state / agitated     FAMILY HISTORY: Family History  Problem Relation Age of Onset   Cancer Mother        melanoma    Heart disease Mother    Cancer Maternal Grandfather        prostate cancer   Alcohol abuse Other    Arthritis Other    Hypertension Other    High Cholesterol Sister    Psoriasis Brother    Osteoarthritis Maternal Grandmother 99   Scoliosis Maternal Grandmother       Objective:  *** General: No acute distress.  Patient appears ***-groomed.   Head:  Normocephalic/atraumatic Eyes:  Fundi examined but not visualized Neck: supple, no paraspinal tenderness, full range of motion Heart:  Regular rate and rhythm Lungs:  Clear to auscultation bilaterally Back: No paraspinal tenderness Neurological Exam: alert and oriented to person, place, and time.  Speech fluent and not dysarthric, language intact.  CN II-XII intact. Bulk and tone normal, muscle strength 5/5 throughout.  Sensation to light touch intact.  Deep  tendon reflexes 2+ throughout, toes downgoing.  Finger to nose testing intact.  Gait normal, Romberg negative.   Metta Clines, DO  CC: ***

## 2021-10-29 ENCOUNTER — Ambulatory Visit: Payer: Medicare Other | Admitting: Neurology

## 2021-11-04 NOTE — Progress Notes (Signed)
? ?NEUROLOGY FOLLOW UP OFFICE NOTE ? ?NEMIAH BUBAR ?[id number] ? ?Assessment/Plan:  ? ?Benign essential tremor ?Residual left ulnar neuralgia s/p decompression and neuropathy ?Cervical spinal stenosis and cervical HNP C6/7 s/p corpectomy ?Polyneuropathy, probable diabetic ?  ?Primidone 94m in AM and 1561mat bedtime ?To optimize neuropathic pain control, increase Horizant to 60060mt bedtime for neuralgia ?Follow up 1 year. ? ?Subjective:  ?RonMaggie Senseney a 55 11ar old right-handed woman with hypothyroidism, hyperlipidemia, diabetes, hypertension, hypoventilation, cerebrovascular disease and former smoker who follows up for essential tremor. ?  ?UPDATE: ?Current medication: primidone 94m28m AM and 194mg58mbedtime.  Other medications include Horizant 300mg 81medtime for neuropathic pain. ?  ?Tremors are stable.  Neuropathic pain in the feet are uncomfortable. ?  ?Continued to have numbness and pain despite ulnar decompression. In October, she started having weakness in the hands.  MRI of cervical spine on 04/26/2021 showed large disc extrusion causing severe spinal stenosis impinging the spinal cord at the C5-7 level.  Underwent C6 corpectomy and arthrodesis from C5-7 in December.  Feeling better. ?  ?HISTORY: ? She underwent left ulnar decompression surgery in August 2014.  When the cast was removed the following month, she noted twitching of her fifth digit.  An MRI of the cervical spine was performed in December 2014, which revealed no structural etiology for radiculopathy.  She had a NCV-EMG in January 2015 which was essentially normal.  She continued to have numbness, paresthesias and weakness of the fourth and fifth digits of the left hand.  She later developed tremor in both hands, more noticeable in the right hand since she is right-handed dominant.  It occurs when she is writing or holding utensils.  She denies gait difficulty, rigidity, lack of sense of smell, or history consistent with REM sleep  behavior.  She is concerned about Parkinson's disease because her maternal grandfather and uncle had it.  Another uncle had tremor. ?  ?She has numbness and tingling in the toes and fingers.  She also notes electric pain in the right leg below the knee to the foot.  For neuropathic pain, she has tried gabapentin, Cymbalta, and Lyrica. ? ?PAST MEDICAL HISTORY: ?Past Medical History:  ?Diagnosis Date  ? Anxiety   ? ANXIETY DEPRESSION 01/29/2009  ? 04/26/2019- no current  ? Arthritis   ? Asthma   ? CHF (congestive heart failure) (HCC)  [place] 11/19/[place]  ? Diabetes (HCC)  [place]pnea   ? FREQUENCY, URINARY 10/17/2009  ? HOH (hard of hearing)   ? HYPERLIPIDEMIA 11/19/2007  ? HYPERTENSION 11/19/2007  ? HYPOTHYROIDISM 11/19/2007  ? Morbid obesity (HCC)  [place]uropathy 01/26/2019  ? Neuropathy   ? PNEUMONIA 08/06/2010  ? Pneumonia   ? POLYCYTHEMIA 01/29/2009  ? Preventative health care 04/06/2011  ? Sleep apnea   ? mild sleep apnea, on o2 at 2l at nighttime  ? SLEEP RELATED HYPOVENTILATION/HYPOXEMIA CCE 02/08/2009  ? Venous insufficiency 07/19/2012  ? ? ?MEDICATIONS: ?Current Outpatient Medications on File Prior to Visit  ?Medication Sig Dispense Refill  ? albuterol (VENTOLIN HFA) 108 (90 Base) MCG/ACT inhaler USE 2 INHALATIONS BY MOUTH  EVERY 6 HOURS AS NEEDED 34 g 3  ? Accu-Chek Softclix Lancets lancets Use as instructed to check blood sugar once daily 100 each 3  ? albuterol (PROVENTIL) (2.5 MG/3ML) 0.083% nebulizer solution Take 3 mLs (2.5 mg total) by nebulization every 6 (six) hours as needed for wheezing or shortness of breath. 75 mL 3  ?  aspirin EC 81 MG tablet Take 81 mg by mouth daily.    ? bisoprolol-hydrochlorothiazide (ZIAC) 5-6.25 MG tablet TAKE 1 TABLET BY MOUTH  DAILY 90 tablet 3  ? Blood Glucose Monitoring Suppl (ACCU-CHEK GUIDE) w/Device KIT Use as instructed to check blood sugar 2X daily 1 kit 0  ? BREO ELLIPTA 100-25 MCG/ACT AEPB INHALE 1 INHALATION BY  MOUTH INTO THE LUNGS IN THE MORNING 180 each  3  ? Cholecalciferol (VITAMIN D3) 75 MCG (3000 UT) TABS Take 3,000 Units by mouth daily.    ? clonazePAM (KLONOPIN) 0.5 MG tablet Take 1 tablet (0.5 mg total) by mouth 2 (two) times daily as needed for anxiety. 60 tablet 5  ? cyclobenzaprine (FLEXERIL) 5 MG tablet TAKE 1 TABLET BY MOUTH 3  TIMES DAILY AS NEEDED FOR  MUSCLE SPASM(S) (Patient taking differently: TAKE 1 TABLET BY MOUTH 3  TIMES DAILY AS NEEDED FOR  MUSCLE SPASM(S) ?On hold while taking taking Vicodin- patient is holding- while on vicodin- for safety.) 270 tablet 1  ? Diclofenac Sodium (PENNSAID) 2 % SOLN Place 2 g onto the skin 2 (two) times daily. (Patient taking differently: Place 2 g onto the skin 2 (two) times daily as needed (pain).) 112 g 3  ? empagliflozin (JARDIANCE) 25 MG TABS tablet Take 1 tablet (25 mg total) by mouth daily before breakfast. 90 tablet 3  ? fluconazole (DIFLUCAN) 150 MG tablet 1 tab by mouth every 3 days as needed 2 tablet 1  ? FLUoxetine (PROZAC) 40 MG capsule TAKE 1 CAPSULE BY MOUTH  DAILY 90 capsule 3  ? furosemide (LASIX) 40 MG tablet TAKE 1 TABLET BY MOUTH DAILY (Patient taking differently: Take 40 mg by mouth every other day.) 90 tablet 1  ? glucose blood (ACCU-CHEK GUIDE) test strip Use as instructed 100 each 12  ? HORIZANT 300 MG TBCR TAKE 1 TABLET BY MOUTH AT  BEDTIME 30 tablet 0  ? irbesartan (AVAPRO) 75 MG tablet TAKE 1 TABLET BY MOUTH AT  BEDTIME 90 tablet 3  ? Lancets Misc. (ACCU-CHEK FASTCLIX LANCET) KIT Use to check blood sugar once a day 1 kit 0  ? levothyroxine (SYNTHROID) 200 MCG tablet TAKE 1 TABLET BY MOUTH  DAILY 90 tablet 1  ? levothyroxine (SYNTHROID) 25 MCG tablet Take 1 tablet (25 mcg total) by mouth daily before breakfast. Take with 200 mcg to equal 225 mcg daily 90 tablet 3  ? metFORMIN (GLUCOPHAGE-XR) 500 MG 24 hr tablet Take 1 tablet (500 mg total) by mouth daily with breakfast. 90 tablet 3  ? montelukast (SINGULAIR) 10 MG tablet TAKE 1 TABLET BY MOUTH AT  BEDTIME 90 tablet 3  ? Multiple Vitamin  (MULTIVITAMIN WITH MINERALS) TABS tablet Take 1 tablet by mouth daily.    ? nystatin cream (MYCOSTATIN) Apply 1 application. topically 2 (two) times daily. 30 g 2  ? ondansetron (ZOFRAN ODT) 4 MG disintegrating tablet Take 1 tablet (4 mg total) by mouth every 8 (eight) hours as needed for nausea or vomiting. 4 tablet 0  ? oxyCODONE (OXY IR/ROXICODONE) 5 MG immediate release tablet Take 5 mg by mouth 4 (four) times daily as needed.    ? OXYGEN 2lpm with sleep only  ?[facility]    ? predniSONE (DELTASONE) 10 MG tablet 3 tabs by mouth per day for 3 days,2tabs per day for 3 days,1tab per day for 3 days 18 tablet 0  ? primidone (MYSOLINE) 50 MG tablet TAKE 1 TABLET BY MOUTH IN  THE MORNING AND 3 TABLETS  BY MOUTH AT BEDTIME 360 tablet 0  ? Semaglutide,0.25 or 0.5MG/DOS, (OZEMPIC, 0.25 OR 0.5 MG/DOSE,) 2 MG/1.5ML SOPN Inject 0.5 mg into the skin once a week. 4.5 mL 3  ? simvastatin (ZOCOR) 40 MG tablet TAKE 1 TABLET BY MOUTH AT  BEDTIME 90 tablet 3  ? spironolactone (ALDACTONE) 25 MG tablet TAKE 1 TABLET BY MOUTH  TWICE DAILY 180 tablet 1  ? ?No current facility-administered medications on file prior to visit.  ? ? ?ALLERGIES: ?Allergies  ?Allergen Reactions  ? Cymbalta [Duloxetine Hcl]   ?  Altered mental state / agitated   ? ? ?FAMILY HISTORY: ?Family History  ?Problem Relation Age of Onset  ? Cancer Mother   ?     melanoma   ? Heart disease Mother   ? Cancer Maternal Grandfather   ?     prostate cancer  ? Alcohol abuse Other   ? Arthritis Other   ? Hypertension Other   ? High Cholesterol Sister   ? Psoriasis Brother   ? Osteoarthritis Maternal Grandmother 39  ? Scoliosis Maternal Grandmother   ? ? ?  ?Objective:  ?Blood pressure 121/60, pulse 87, resp. rate 18, weight 292 lb (132.5 kg), SpO2 97 %. ?General: No acute distress.  Patient appears well-groomed.   ?Head:  Normocephalic/atraumatic ?Eyes:  Fundi examined but not visualized ?Neck: supple, no paraspinal tenderness, full range of motion ?Heart:  Regular rate and  rhythm ?Lungs:  Clear to auscultation bilaterally ?Back: No paraspinal tenderness ?Neurological Exam: alert and oriented to person, place, and time.  Speech fluent and not dysarthric, language intact.  CN II

## 2021-11-05 ENCOUNTER — Encounter: Payer: Self-pay | Admitting: Neurology

## 2021-11-05 ENCOUNTER — Ambulatory Visit: Payer: Medicare Other | Admitting: Neurology

## 2021-11-05 VITALS — BP 121/60 | HR 87 | Resp 18 | Wt 292.0 lb

## 2021-11-05 DIAGNOSIS — G609 Hereditary and idiopathic neuropathy, unspecified: Secondary | ICD-10-CM

## 2021-11-05 DIAGNOSIS — G250 Essential tremor: Secondary | ICD-10-CM

## 2021-11-05 MED ORDER — HORIZANT 600 MG PO TBCR: 600.0000 mg | EXTENDED_RELEASE_TABLET | Freq: Every day | ORAL | 3 refills | Status: DC

## 2021-11-17 DIAGNOSIS — J449 Chronic obstructive pulmonary disease, unspecified: Secondary | ICD-10-CM | POA: Diagnosis not present

## 2021-12-18 DIAGNOSIS — J449 Chronic obstructive pulmonary disease, unspecified: Secondary | ICD-10-CM | POA: Diagnosis not present

## 2022-01-08 ENCOUNTER — Ambulatory Visit: Payer: Medicare Other | Admitting: Internal Medicine

## 2022-01-10 ENCOUNTER — Telehealth: Payer: Self-pay | Admitting: Internal Medicine

## 2022-01-10 DIAGNOSIS — E1165 Type 2 diabetes mellitus with hyperglycemia: Secondary | ICD-10-CM

## 2022-01-10 DIAGNOSIS — E559 Vitamin D deficiency, unspecified: Secondary | ICD-10-CM

## 2022-01-10 DIAGNOSIS — D751 Secondary polycythemia: Secondary | ICD-10-CM

## 2022-01-10 NOTE — Telephone Encounter (Signed)
Pt stated she can not remember if Dr. Jenny Reichmann advised her to have labs done before her 01/14/22 appointment. Pt stated if she is supposed to have labs before her appointment then she would like them ordered. Pt stated if the labs aren't required before her appointment then she would like a call letting her know that.    Please advise  CB: [phone]

## 2022-01-13 NOTE — Telephone Encounter (Signed)
Ok labs ordered 

## 2022-01-14 ENCOUNTER — Encounter: Payer: Self-pay | Admitting: Internal Medicine

## 2022-01-14 ENCOUNTER — Ambulatory Visit (INDEPENDENT_AMBULATORY_CARE_PROVIDER_SITE_OTHER): Payer: Medicare Other | Admitting: Internal Medicine

## 2022-01-14 VITALS — BP 110/60 | HR 76 | Temp 98.0°F | Ht 61.0 in | Wt 294.0 lb

## 2022-01-14 DIAGNOSIS — E559 Vitamin D deficiency, unspecified: Secondary | ICD-10-CM

## 2022-01-14 DIAGNOSIS — G8929 Other chronic pain: Secondary | ICD-10-CM | POA: Diagnosis not present

## 2022-01-14 DIAGNOSIS — E7800 Pure hypercholesterolemia, unspecified: Secondary | ICD-10-CM | POA: Diagnosis not present

## 2022-01-14 DIAGNOSIS — E1165 Type 2 diabetes mellitus with hyperglycemia: Secondary | ICD-10-CM

## 2022-01-14 DIAGNOSIS — F17200 Nicotine dependence, unspecified, uncomplicated: Secondary | ICD-10-CM

## 2022-01-14 MED ORDER — ROSUVASTATIN CALCIUM 40 MG PO TABS: 40.0000 mg | ORAL_TABLET | Freq: Every day | ORAL | 3 refills | Status: DC

## 2022-01-14 MED ORDER — BUPROPION HCL ER (XL) 150 MG PO TB24: 150.0000 mg | ORAL_TABLET | Freq: Every day | ORAL | 3 refills | Status: DC

## 2022-01-14 NOTE — Progress Notes (Signed)
Patient ID: Autumn Massey, female   DOB: 06-28-1966, 56 y.o.   MRN: [id number]        Chief Complaint: follow up HTN, HLD and hyperglycemia, uti, chronic pain, smoking       HPI:  Autumn Massey is a 56 y.o. female here overall doing ok, Denies urinary symptoms such as dysuria, frequency, urgency, flank pain, hematuria or n/v, fever, chills, as recent uti tx seems to have resolved this.  Has ongoing chronic pain to neck, but also massive pannus and chronic lbp, and getting by for now with oxycodone prn per Dr Cyndy Freeze NS.  Working on wt loss - Current wt goal is 280.  Needs BMI 45 to qualify to have the pannus removed.  Just started ozempic, plans to see endo in f/u soon.  Unfortunately, just started back to smoking x 1 mo after quit for 6 yrs, just fell back into bad behavioral habit it seems, as Denies worsening depressive symptoms, suicidal ideation, or panic; has ongoing anxiety, and would be ok with start wellbutrin, as chantix gave her HA in the past.   Wt Readings from Last 3 Encounters:  01/14/22 294 lb (133.4 kg)  11/05/21 292 lb (132.5 kg)  10/21/21 292 lb 12.8 oz (132.8 kg)   BP Readings from Last 3 Encounters:  01/14/22 110/60  11/05/21 121/60  10/21/21 120/66         Past Medical History:  Diagnosis Date   Anxiety    ANXIETY DEPRESSION 01/29/2009   04/26/2019- no current   Arthritis    Asthma    CHF (congestive heart failure) ([place])    DEPRESSION 11/19/2007   Diabetes ([place])    Dyspnea    FREQUENCY, URINARY 10/17/2009   HOH (hard of hearing)    HYPERLIPIDEMIA 11/19/2007   HYPERTENSION 11/19/2007   HYPOTHYROIDISM 11/19/2007   Morbid obesity ([place])    Neuropathy 01/26/2019   Neuropathy    PNEUMONIA 08/06/2010   Pneumonia    POLYCYTHEMIA 01/29/2009   Preventative health care 04/06/2011   Sleep apnea    mild sleep apnea, on o2 at 2l at nighttime   [place] CCE 02/08/2009   Venous insufficiency 07/19/2012   Past Surgical History:   Procedure Laterality Date   ANTERIOR CERVICAL CORPECTOMY N/A 06/19/2021   Procedure: Cerivcal Six Corpectomy;  Surgeon: Ashok Pall, MD;  Location: [facility];  Service: Neurosurgery;  Laterality: N/A;   DILITATION & CURRETTAGE/HYSTROSCOPY WITH NOVASURE ABLATION N/A 04/28/2019   Procedure: DILATATION & CURETTAGE/HYSTEROSCOPY WITH NOVASURE ABLATION;  Surgeon: Molli Posey, MD;  Location: [facility];  Service: Gynecology;  Laterality: N/A;  [employer] rep will be here confirmed on 04/20/19   TONSILLECTOMY     ULNAR TUNNEL RELEASE Left 02/22/2013   Procedure: LEFT ULNAR NERVE DECOMPRESSION ;  Surgeon: Tennis Must, MD;  Location: [facility];  Service: Orthopedics;  Laterality: Left;    reports that she has been smoking cigarettes. She has a 11.00 pack-year smoking history. She has never used smokeless tobacco. She reports current alcohol use. She reports that she does not use drugs. family history includes Alcohol abuse in an other family member; Arthritis in an other family member; Cancer in her maternal grandfather and mother; Heart disease in her mother; High Cholesterol in her sister; Hypertension in an other family member; Osteoarthritis (age of onset: 51) in her maternal grandmother; Psoriasis in her brother; Scoliosis in her maternal grandmother. Allergies  Allergen Reactions   Cymbalta [Duloxetine Hcl]  Altered mental state / agitated    Current Outpatient Medications on File Prior to Visit  Medication Sig Dispense Refill   Accu-Chek Softclix Lancets lancets Use as instructed to check blood sugar once daily 100 each 3   albuterol (PROVENTIL) (2.5 MG/3ML) 0.083% nebulizer solution Take 3 mLs (2.5 mg total) by nebulization every 6 (six) hours as needed for wheezing or shortness of breath. 75 mL 3   albuterol (VENTOLIN HFA) 108 (90 Base) MCG/ACT inhaler USE 2 INHALATIONS BY MOUTH  EVERY 6 HOURS AS NEEDED 34 g 3   aspirin EC 81 MG tablet Take 81 mg by mouth daily.      bisoprolol-hydrochlorothiazide (ZIAC) 5-6.25 MG tablet TAKE 1 TABLET BY MOUTH  DAILY 90 tablet 3   Blood Glucose Monitoring Suppl (ACCU-CHEK GUIDE) w/Device KIT Use as instructed to check blood sugar 2X daily 1 kit 0   BREO ELLIPTA 100-25 MCG/ACT AEPB INHALE 1 INHALATION BY  MOUTH INTO THE LUNGS IN THE MORNING 180 each 3   Cholecalciferol (VITAMIN D3) 75 MCG (3000 UT) TABS Take 3,000 Units by mouth daily.     clonazePAM (KLONOPIN) 0.5 MG tablet Take 1 tablet (0.5 mg total) by mouth 2 (two) times daily as needed for anxiety. 60 tablet 5   cyclobenzaprine (FLEXERIL) 5 MG tablet TAKE 1 TABLET BY MOUTH 3  TIMES DAILY AS NEEDED FOR  MUSCLE SPASM(S) (Patient taking differently: TAKE 1 TABLET BY MOUTH 3  TIMES DAILY AS NEEDED FOR  MUSCLE SPASM(S) On hold while taking taking Vicodin- patient is holding- while on vicodin- for safety.) 270 tablet 1   Diclofenac Sodium (PENNSAID) 2 % SOLN Place 2 g onto the skin 2 (two) times daily. (Patient taking differently: Place 2 g onto the skin 2 (two) times daily as needed (pain).) 112 g 3   empagliflozin (JARDIANCE) 25 MG TABS tablet Take 1 tablet (25 mg total) by mouth daily before breakfast. 90 tablet 3   fluconazole (DIFLUCAN) 150 MG tablet 1 tab by mouth every 3 days as needed 2 tablet 1   FLUoxetine (PROZAC) 40 MG capsule TAKE 1 CAPSULE BY MOUTH  DAILY 90 capsule 3   furosemide (LASIX) 40 MG tablet TAKE 1 TABLET BY MOUTH DAILY (Patient taking differently: Take 40 mg by mouth every other day.) 90 tablet 1   Gabapentin Enacarbil (HORIZANT) 600 MG TBCR Take 1 tablet (600 mg total) by mouth at bedtime. 90 tablet 3   glucose blood (ACCU-CHEK GUIDE) test strip Use as instructed 100 each 12   irbesartan (AVAPRO) 75 MG tablet TAKE 1 TABLET BY MOUTH AT  BEDTIME 90 tablet 3   Lancets Misc. (ACCU-CHEK FASTCLIX LANCET) KIT Use to check blood sugar once a day 1 kit 0   levothyroxine (SYNTHROID) 200 MCG tablet TAKE 1 TABLET BY MOUTH  DAILY 90 tablet 1   levothyroxine  (SYNTHROID) 25 MCG tablet Take 1 tablet (25 mcg total) by mouth daily before breakfast. Take with 200 mcg to equal 225 mcg daily 90 tablet 3   metFORMIN (GLUCOPHAGE-XR) 500 MG 24 hr tablet Take 1 tablet (500 mg total) by mouth daily with breakfast. 90 tablet 3   montelukast (SINGULAIR) 10 MG tablet TAKE 1 TABLET BY MOUTH AT  BEDTIME 90 tablet 3   Multiple Vitamin (MULTIVITAMIN WITH MINERALS) TABS tablet Take 1 tablet by mouth daily.     nystatin cream (MYCOSTATIN) Apply 1 application. topically 2 (two) times daily. 30 g 2   ondansetron (ZOFRAN ODT) 4 MG disintegrating tablet Take 1  tablet (4 mg total) by mouth every 8 (eight) hours as needed for nausea or vomiting. 4 tablet 0   oxyCODONE (OXY IR/ROXICODONE) 5 MG immediate release tablet Take 5 mg by mouth 4 (four) times daily as needed.     OXYGEN 2lpm with sleep only  [facility]     primidone (MYSOLINE) 50 MG tablet TAKE 1 TABLET BY MOUTH IN  THE MORNING AND 3 TABLETS  BY MOUTH AT BEDTIME 360 tablet 0   Semaglutide,0.25 or 0.5MG/DOS, (OZEMPIC, 0.25 OR 0.5 MG/DOSE,) 2 MG/1.5ML SOPN Inject 0.5 mg into the skin once a week. 4.5 mL 3   spironolactone (ALDACTONE) 25 MG tablet TAKE 1 TABLET BY MOUTH  TWICE DAILY 180 tablet 1   No current facility-administered medications on file prior to visit.        ROS:  All others reviewed and negative.  Objective        PE:  BP 110/60 (BP Location: Left Arm, Patient Position: Sitting, Cuff Size: Large)   Pulse 76   Temp 98 F (36.7 C) (Oral)   Ht '5\' 1"'  (1.549 m)   Wt 294 lb (133.4 kg)   SpO2 95%   BMI 55.55 kg/m                 Constitutional: Pt appears in NAD               HENT: Head: NCAT.                Right Ear: External ear normal.                 Left Ear: External ear normal.                Eyes: . Pupils are equal, round, and reactive to light. Conjunctivae and EOM are normal               Nose: without d/c or deformity               Neck: Neck supple. Gross normal ROM                Cardiovascular: Normal rate and regular rhythm.                 Pulmonary/Chest: Effort normal and breath sounds without rales or wheezing.                Abd:  Soft, NT, ND, + BS, no organomegaly               Neurological: Pt is alert. At baseline orientation, motor grossly intact               Skin: Skin is warm. No rashes, no other new lesions, LE edema - none               Psychiatric: Pt behavior is normal without agitation   Micro: none  Cardiac tracings I have personally interpreted today:  none  Pertinent Radiological findings (summarize): none   Lab Results  Component Value Date   WBC 10.8 (H) 10/11/2021   HGB 17.7 (H) 10/11/2021   HCT 53.9 (H) 10/11/2021   PLT 203.0 10/11/2021   GLUCOSE 122 (H) 07/10/2021   CHOL 160 07/10/2021   TRIG 263.0 (H) 07/10/2021   HDL 34.20 (L) 07/10/2021   LDLDIRECT 92.0 07/10/2021   LDLCALC 109 (H) 01/27/2020   ALT 16 07/10/2021   AST 16 07/10/2021   NA 136 07/10/2021  K 4.1 07/10/2021   CL 97 07/10/2021   CREATININE 0.64 07/10/2021   BUN 11 07/10/2021   CO2 34 (H) 07/10/2021   TSH 2.55 07/10/2021   HGBA1C 8.4 (A) 10/21/2021   MICROALBUR 3.4 (H) 07/10/2021   Assessment/Plan:  Autumn Massey is a 56 y.o. White or Caucasian [1] female with  has a past medical history of Anxiety, ANXIETY DEPRESSION (01/29/2009), Arthritis, Asthma, CHF (congestive heart failure) ([place]), DEPRESSION (11/19/2007), Diabetes ([place]), Dyspnea, FREQUENCY, URINARY (10/17/2009), HOH (hard of hearing), HYPERLIPIDEMIA (11/19/2007), HYPERTENSION (11/19/2007), HYPOTHYROIDISM (11/19/2007), Morbid obesity ([place]), Neuropathy (01/26/2019), Neuropathy, PNEUMONIA (08/06/2010), Pneumonia, POLYCYTHEMIA (01/29/2009), Preventative health care (04/06/2011), Sleep apnea, SLEEP RELATED HYPOVENTILATION/HYPOXEMIA CCE (02/08/2009), and Venous insufficiency (07/19/2012).  Vitamin D deficiency Last vitamin D Lab Results  Component Value Date   VD25OH 35.27 07/10/2021   Low, to start  oral replacement   Type 2 diabetes mellitus with hyperglycemia, without long-term current use of insulin (HCC) Lab Results  Component Value Date   HGBA1C 8.4 (A) 10/21/2021   Uncontrolled, just started ozempic with endo last wt, pt to continue current medical treatment ozemipic, jardiance, metformin ER 500 qddiet and f/u endo as planned    HLD (hyperlipidemia) Lab Results  Component Value Date   LDLCALC 109 (H) 01/27/2020   Uncontrolled,, pt to change current statin zocor to crestor 40 mg qd with goal ldl < 70   Current every day smoker Unfortunately back to daily smoking in the past month, chantix caused HA in past, now for wellbutrin xl 150 qd  Chronic pain Related to massive pannus and chronic low back pain, as well recurring cervical pain followed per Dr Cyndy Freeze who has been tx with oxycodone prn   Followup: Return in about 6 months (around 07/17/2022).  [name], MD 01/18/2022 1:41 PM [facility]

## 2022-01-14 NOTE — Patient Instructions (Signed)
Please take all new medication as prescribed - the wellbutrin xl 150 mg per day for smoking, and the crestor 40 mg for cholesterol  Ok to stop the simvastatin   Please continue all other medications as before, and refills have been done if requested.  Please have the pharmacy call with any other refills you may need.  Please continue your efforts at being more active, low cholesterol diet, and weight control.  You are otherwise up to date with prevention measures today.  Please keep your appointments with your specialists as you may have planned  Please make an Appointment to return in 6 months, or sooner if needed

## 2022-01-17 DIAGNOSIS — J449 Chronic obstructive pulmonary disease, unspecified: Secondary | ICD-10-CM | POA: Diagnosis not present

## 2022-01-18 ENCOUNTER — Encounter: Payer: Self-pay | Admitting: Internal Medicine

## 2022-01-18 DIAGNOSIS — G8929 Other chronic pain: Secondary | ICD-10-CM | POA: Insufficient documentation

## 2022-01-18 NOTE — Assessment & Plan Note (Signed)
Related to massive pannus and chronic low back pain, as well recurring cervical pain followed per Dr Cyndy Freeze who has been tx with oxycodone prn

## 2022-01-18 NOTE — Assessment & Plan Note (Signed)
Lab Results  Component Value Date   HGBA1C 8.4 (A) 10/21/2021   Uncontrolled, just started ozempic with endo last wt, pt to continue current medical treatment ozemipic, jardiance, metformin ER 500 qddiet and f/u endo as planned

## 2022-01-18 NOTE — Assessment & Plan Note (Signed)
Last vitamin D Lab Results  Component Value Date   VD25OH 35.27 07/10/2021   Low, to start oral replacement

## 2022-01-18 NOTE — Assessment & Plan Note (Signed)
Lab Results  Component Value Date   LDLCALC 109 (H) 01/27/2020   Uncontrolled,, pt to change current statin zocor to crestor 40 mg qd with goal ldl < 70

## 2022-01-18 NOTE — Assessment & Plan Note (Signed)
Unfortunately back to daily smoking in the past month, chantix caused HA in past, now for wellbutrin xl 150 qd

## 2022-01-20 DIAGNOSIS — M50222 Other cervical disc displacement at C5-C6 level: Secondary | ICD-10-CM | POA: Diagnosis not present

## 2022-01-21 ENCOUNTER — Ambulatory Visit: Payer: Medicare Other | Admitting: Plastic Surgery

## 2022-01-24 ENCOUNTER — Ambulatory Visit (INDEPENDENT_AMBULATORY_CARE_PROVIDER_SITE_OTHER): Payer: Medicare Other | Admitting: Internal Medicine

## 2022-01-24 ENCOUNTER — Other Ambulatory Visit: Payer: Self-pay | Admitting: Internal Medicine

## 2022-01-24 ENCOUNTER — Encounter: Payer: Self-pay | Admitting: Internal Medicine

## 2022-01-24 VITALS — BP 118/76 | HR 93 | Ht 61.5 in | Wt 296.4 lb

## 2022-01-24 DIAGNOSIS — E282 Polycystic ovarian syndrome: Secondary | ICD-10-CM | POA: Diagnosis not present

## 2022-01-24 DIAGNOSIS — E039 Hypothyroidism, unspecified: Secondary | ICD-10-CM | POA: Diagnosis not present

## 2022-01-24 DIAGNOSIS — E1165 Type 2 diabetes mellitus with hyperglycemia: Secondary | ICD-10-CM | POA: Diagnosis not present

## 2022-01-24 LAB — POCT GLYCOSYLATED HEMOGLOBIN (HGB A1C): Hemoglobin A1C: 7.6 % — AB (ref 4.0–5.6)

## 2022-01-24 NOTE — Progress Notes (Signed)
Patient ID: Autumn Massey, female   DOB: 29-Jul-1965, 56 y.o.   MRN: [id number]  HPI  Autumn Massey is a 56 y.o.-year-old female, returning for f/u for DM2, hypothyroidism, and PCOS. Last visit 3 months ago.  Interim history: She was referred to a plastic surgeon to remove her abdominal pannus.  She needs to have lower BMI before surgery, lower than 45.  Also, HbA1c has been lower than 8.  She was offered gastric bypass, but she refused. No increased urination, blurry vision, nausea, chest pain.  PCOS: Reviewed history:  - weight gain: Maximal weight 364  - was on Phentermine, which worked initially, then stopped working  - Would consider gastric bypass but only as a last resort - + hirsutism >> started to see facial hair in her 30s - + hair loss >> female pattern, and bald spot in vertex - No acne - had a Mirena IUD placed 04/2013 for heavy menstrual cycles >> out now; no menses since IUD out - has frequent Prednisone tapers    She was previously on spironolactone 50 mg twice daily but her potassium increased so we had to decrease the dose to 25 mg twice daily.  We ended up stopping spironolactone 11/2016.  However, she started to have more facial hair and more frontal balding so we restarted spironolactone 25 mg twice a day.  Her potassium level is normal now: Lab Results  Component Value Date   K 4.1 07/10/2021   Hypothyroidism: Pt. has been dx with hypothyroidism in 2005.  Pt is on levothyroxine (crushed) 225 mcg daily, taken: - in am - fasting - at least 30 min from b'fast - no Ca, Fe - + MVI 2h later! >> moved >4h later - no PPIs - not on Biotin  Reviewed her TFTs: Lab Results  Component Value Date   TSH 2.55 07/10/2021   TSH 4.06 03/15/2021   TSH 21.97 (H) 12/06/2020   TSH 13.74 (H) 10/03/2020   TSH 42.400 (H) 09/19/2020   TSH 71.60 (H) 01/27/2020   TSH 0.80 10/20/2019   TSH 0.19 (L) 03/01/2019   TSH 0.16 (L) 01/26/2019   TSH 0.65 07/26/2018   FREET4 1.02  03/15/2021   FREET4 0.70 12/06/2020   FREET4 0.90 10/03/2020   FREET4 0.68 (L) 09/19/2020   FREET4 1.17 10/20/2019   FREET4 1.23 03/01/2019   FREET4 1.21 07/26/2018   FREET4 0.97 04/30/2018   FREET4 1.32 03/02/2018   FREET4 1.25 05/05/2017    Pt denies: - feeling nodules in neck - hoarseness - dysphagia - choking  She was on Neurontin for tremors, but had to decrease the dose because of somnolence and then she was able to stop. She is on primidone >> restarted by Dr. Tomi Likens.  She was on Lyrica at night >> switched to Gabapentin.  DM2: Reviewed HbA1c levels: Lab Results  Component Value Date   HGBA1C 8.4 (A) 10/21/2021   HGBA1C 8.2 (H) 07/10/2021   HGBA1C 8.4 (H) 06/17/2021  04/26/2019: HbA1c calculated from fructosamine is 5.4% 03/09/2018: HbA1c calculated from fructosamine is better, at 5.5%! 09/02/2017: HbA1c calculated from Fructosamine is 6.1%. 05/05/2017: HbA1c calculated from Fructosamine is 6.00%. 03/10/2017: HbA1c calculated from Fructosamine is 6.17% (great!). 11/2016: HbA1c calculated from the fructosamine is radically better, at 5.77%!  She is on: - Metformin 1000 mg with dinner >> 500 mg with dinner >> in am - Jardiance 10 mg in the pm -  started 02/2017 >> initially stopped b/c UTI and also had a tear  on her labia >> resolved -currently on 25 mg daily - Ozempic 0.25 >> 0.5 mg weekly -restarted 09/2021 Not taking Januvia >> not covered. She was previously on glipizide.  She checks sugars 1-2 times a day: - am: 120s >> 123, 141 >> 110-132 >> 114-138 - after b'fast: 153 >> n/c - lunch: n/c - after lunch: 145-150 >> 142 >> 131-152 >> n/c - dinner: n/c >> 120-140, 157 >> n/c - after dinner: 173-180 >> 149-173 >> 142-168 Highest CBG: 188 >> 230 (b'day cake + iceceam).  No CKD: Lab Results  Component Value Date   BUN 11 07/10/2021   Lab Results  Component Value Date   CREATININE 0.64 07/10/2021   + HL: Lab Results  Component Value Date   CHOL 160  07/10/2021   HDL 34.20 (L) 07/10/2021   LDLCALC 109 (H) 01/27/2020   LDLDIRECT 92.0 07/10/2021   TRIG 263.0 (H) 07/10/2021   CHOLHDL 5 07/10/2021  She is on Crestor 40 mg daily.  She is due for an eye exam.  She had a foot exam 06/2021.  She quit smoking 05/2015.  She had a D&C on 04/28/2019.  ROS: + See HPI + Tremors   I reviewed pt's medications, allergies, PMH, social hx, family hx, and changes were documented in the history of present illness. Otherwise, unchanged from my initial visit note.  She does not have a family history of medullary thyroid cancer or personal history of pancreatitis.  Past Medical History:  Diagnosis Date   Anxiety    ANXIETY DEPRESSION 01/29/2009   04/26/2019- no current   Arthritis    Asthma    CHF (congestive heart failure) ([place])    DEPRESSION 11/19/2007   Diabetes ([place])    Dyspnea    FREQUENCY, URINARY 10/17/2009   HOH (hard of hearing)    HYPERLIPIDEMIA 11/19/2007   HYPERTENSION 11/19/2007   HYPOTHYROIDISM 11/19/2007   Morbid obesity ([place])    Neuropathy 01/26/2019   Neuropathy    PNEUMONIA 08/06/2010   Pneumonia    POLYCYTHEMIA 01/29/2009   Preventative health care 04/06/2011   Sleep apnea    mild sleep apnea, on o2 at 2l at nighttime   [place] CCE 02/08/2009   Venous insufficiency 07/19/2012   Past Surgical History:  Procedure Laterality Date   ANTERIOR CERVICAL CORPECTOMY N/A 06/19/2021   Procedure: Cerivcal Six Corpectomy;  Surgeon: Ashok Pall, MD;  Location: [facility];  Service: Neurosurgery;  Laterality: N/A;   DILITATION & CURRETTAGE/HYSTROSCOPY WITH NOVASURE ABLATION N/A 04/28/2019   Procedure: DILATATION & CURETTAGE/HYSTEROSCOPY WITH NOVASURE ABLATION;  Surgeon: Molli Posey, MD;  Location: [facility];  Service: Gynecology;  Laterality: N/A;  [employer] rep will be here confirmed on 04/20/19   TONSILLECTOMY     ULNAR TUNNEL RELEASE Left 02/22/2013   Procedure: LEFT ULNAR NERVE DECOMPRESSION  ;  Surgeon: Tennis Must, MD;  Location: [facility];  Service: Orthopedics;  Laterality: Left;   Social History   Socioeconomic History   Marital status: Divorced    Spouse name: Not on file   Number of children: 0   Years of education: 12   Highest education level: 12th grade  Occupational History   Occupation: Disabled    Employer: [employer]  Tobacco Use   Smoking status: Every Day    Packs/day: 0.50    Years: 22.00    Total pack years: 11.00    Types: Cigarettes   Smokeless tobacco: Never  Vaping Use   Vaping Use:  Never used  Substance and Sexual Activity   Alcohol use: Yes    Alcohol/week: 0.0 standard drinks of alcohol    Comment: rare   Drug use: No   Sexual activity: Never  Other Topics Concern   Not on file  Social History Narrative   Patient is right-handed.   Social Determinants of Health   Financial Resource Strain: Low Risk  (01/26/2020)   Overall Financial Resource Strain (CARDIA)    Difficulty of Paying Living Expenses: Not hard at all  Food Insecurity: No Food Insecurity (01/26/2020)   Hunger Vital Sign    Worried About Running Out of Food in the Last Year: Never true    Ran Out of Food in the Last Year: Never true  Transportation Needs: No Transportation Needs (01/26/2020)   PRAPARE - Hydrologist (Medical): No    Lack of Transportation (Non-Medical): No  Physical Activity: Inactive (01/26/2020)   Exercise Vital Sign    Days of Exercise per Week: 0 days    Minutes of Exercise per Session: 0 min  Stress: No Stress Concern Present (01/26/2020)   [place]    Feeling of Stress : Only a little  Social Connections: Not on file  Intimate Partner Violence: Not on file   Current Outpatient Medications on File Prior to Visit  Medication Sig Dispense Refill   Accu-Chek Softclix Lancets lancets Use as instructed to check blood sugar once  daily 100 each 3   albuterol (PROVENTIL) (2.5 MG/3ML) 0.083% nebulizer solution Take 3 mLs (2.5 mg total) by nebulization every 6 (six) hours as needed for wheezing or shortness of breath. 75 mL 3   albuterol (VENTOLIN HFA) 108 (90 Base) MCG/ACT inhaler USE 2 INHALATIONS BY MOUTH  EVERY 6 HOURS AS NEEDED 34 g 3   aspirin EC 81 MG tablet Take 81 mg by mouth daily.     bisoprolol-hydrochlorothiazide (ZIAC) 5-6.25 MG tablet TAKE 1 TABLET BY MOUTH  DAILY 90 tablet 3   Blood Glucose Monitoring Suppl (ACCU-CHEK GUIDE) w/Device KIT Use as instructed to check blood sugar 2X daily 1 kit 0   BREO ELLIPTA 100-25 MCG/ACT AEPB INHALE 1 INHALATION BY  MOUTH INTO THE LUNGS IN THE MORNING 180 each 3   buPROPion (WELLBUTRIN XL) 150 MG 24 hr tablet Take 1 tablet (150 mg total) by mouth daily. 90 tablet 3   Cholecalciferol (VITAMIN D3) 75 MCG (3000 UT) TABS Take 3,000 Units by mouth daily.     clonazePAM (KLONOPIN) 0.5 MG tablet Take 1 tablet (0.5 mg total) by mouth 2 (two) times daily as needed for anxiety. 60 tablet 5   cyclobenzaprine (FLEXERIL) 5 MG tablet TAKE 1 TABLET BY MOUTH 3  TIMES DAILY AS NEEDED FOR  MUSCLE SPASM(S) (Patient taking differently: TAKE 1 TABLET BY MOUTH 3  TIMES DAILY AS NEEDED FOR  MUSCLE SPASM(S) On hold while taking taking Vicodin- patient is holding- while on vicodin- for safety.) 270 tablet 1   Diclofenac Sodium (PENNSAID) 2 % SOLN Place 2 g onto the skin 2 (two) times daily. (Patient taking differently: Place 2 g onto the skin 2 (two) times daily as needed (pain).) 112 g 3   empagliflozin (JARDIANCE) 25 MG TABS tablet Take 1 tablet (25 mg total) by mouth daily before breakfast. 90 tablet 3   fluconazole (DIFLUCAN) 150 MG tablet 1 tab by mouth every 3 days as needed 2 tablet 1   FLUoxetine (PROZAC) 40 MG  capsule TAKE 1 CAPSULE BY MOUTH  DAILY 90 capsule 3   furosemide (LASIX) 40 MG tablet TAKE 1 TABLET BY MOUTH DAILY (Patient taking differently: Take 40 mg by mouth every other day.) 90  tablet 1   Gabapentin Enacarbil (HORIZANT) 600 MG TBCR Take 1 tablet (600 mg total) by mouth at bedtime. 90 tablet 3   glucose blood (ACCU-CHEK GUIDE) test strip Use as instructed 100 each 12   irbesartan (AVAPRO) 75 MG tablet TAKE 1 TABLET BY MOUTH AT  BEDTIME 90 tablet 3   Lancets Misc. (ACCU-CHEK FASTCLIX LANCET) KIT Use to check blood sugar once a day 1 kit 0   levothyroxine (SYNTHROID) 200 MCG tablet TAKE 1 TABLET BY MOUTH  DAILY 90 tablet 1   levothyroxine (SYNTHROID) 25 MCG tablet Take 1 tablet (25 mcg total) by mouth daily before breakfast. Take with 200 mcg to equal 225 mcg daily 90 tablet 3   metFORMIN (GLUCOPHAGE-XR) 500 MG 24 hr tablet Take 1 tablet (500 mg total) by mouth daily with breakfast. 90 tablet 3   montelukast (SINGULAIR) 10 MG tablet TAKE 1 TABLET BY MOUTH AT  BEDTIME 90 tablet 3   Multiple Vitamin (MULTIVITAMIN WITH MINERALS) TABS tablet Take 1 tablet by mouth daily.     nystatin cream (MYCOSTATIN) Apply 1 application. topically 2 (two) times daily. 30 g 2   ondansetron (ZOFRAN ODT) 4 MG disintegrating tablet Take 1 tablet (4 mg total) by mouth every 8 (eight) hours as needed for nausea or vomiting. 4 tablet 0   oxyCODONE (OXY IR/ROXICODONE) 5 MG immediate release tablet Take 5 mg by mouth 4 (four) times daily as needed.     OXYGEN 2lpm with sleep only  [facility]     primidone (MYSOLINE) 50 MG tablet TAKE 1 TABLET BY MOUTH IN  THE MORNING AND 3 TABLETS  BY MOUTH AT BEDTIME 360 tablet 0   rosuvastatin (CRESTOR) 40 MG tablet Take 1 tablet (40 mg total) by mouth daily. 90 tablet 3   Semaglutide,0.25 or 0.5MG/DOS, (OZEMPIC, 0.25 OR 0.5 MG/DOSE,) 2 MG/1.5ML SOPN Inject 0.5 mg into the skin once a week. 4.5 mL 3   spironolactone (ALDACTONE) 25 MG tablet TAKE 1 TABLET BY MOUTH  TWICE DAILY 180 tablet 1   No current facility-administered medications on file prior to visit.   Allergies  Allergen Reactions   Cymbalta [Duloxetine Hcl]     Altered mental state / agitated    Family  History  Problem Relation Age of Onset   Cancer Mother        melanoma    Heart disease Mother    Cancer Maternal Grandfather        prostate cancer   Alcohol abuse Other    Arthritis Other    Hypertension Other    High Cholesterol Sister    Psoriasis Brother    Osteoarthritis Maternal Grandmother 99   Scoliosis Maternal Grandmother    PE: BP 118/76 (BP Location: Left Arm, Patient Position: Sitting, Cuff Size: Normal)   Pulse 93   Ht 5' (1.524 m)   Wt 296 lb 6.4 oz (134.4 kg)   SpO2 94%   BMI 57.89 kg/m  Wt Readings from Last 3 Encounters:  01/24/22 296 lb 6.4 oz (134.4 kg)  01/14/22 294 lb (133.4 kg)  11/05/21 292 lb (132.5 kg)   Constitutional: overweight, in NAD Eyes: EOMI, no exophthalmos ENT: moist mucous membranes, no thyromegaly, no cervical lymphadenopathy Cardiovascular: tachycardia, RR, No MRG Respiratory: CTA B Musculoskeletal: no deformities  Skin: moist, warm, + stasis dermatitis rashes on lower legs R>L Neurological: no tremor with outstretched hands  PLAN:  1. Patient with longstanding hypothyroidism -Previously uncontrolled -Last year, she had very high TSH levels, 71 and 42 respectively, despite a high dose of levothyroxine.  I was not aware of these results, but I became aware of her TSH from 09/2020 which improved to 13.7.  Afterwards, we discussed about the need for compliance and also advised her to crush the levothyroxine tablet, after which her TFTs normalized. - latest thyroid labs reviewed with pt. >> normal: Lab Results  Component Value Date   TSH 2.55 07/10/2021  - she continues on LT4 225 mcg daily - pt feels good on this dose. - we discussed about taking the thyroid hormone every day, with water, >30 minutes before breakfast, separated by >4 hours from acid reflux medications, calcium, iron, multivitamins. Pt. is taking it correctly.  2. PCOS -She aged out of her PCOS diagnosis -However, she continues to have increased facial hair and  alopecia so she continues on spironolactone 25 mg twice a day -Latest potassium level was normal: Lab Results  Component Value Date   K 4.1 07/10/2021  -We could not increase the dose further due to previous hyperkalemia with higher doses  3. DM2 -She continues on metformin, SGLT2 inhibitor and now restarted the GLP-1 receptor agonist at last visit.  She previously had abdominal pressure, constipation, and abdominal pain with Ozempic and a slightly high lipase.  I was reticent to have her try this again but at last visit, she was interested in retrying Ozempic at a low dose.  We again discussed about possible side effects >> she acknowledged the risk of possible pancreatitis but agreed to let me know if she has any GI symptoms after she starts it.   -At today's visit, sugars are approximately the same as before (reportedly mostly at goal), but she is only checking in the morning and after dinner.  She mentions that Ozempic is cutting down her appetite so she is not eating small meals throughout the entire day anymore but eats a larger dinner.  This is probably the reason why her weight increased by 4 pounds since last visit.  We discussed about the fact that she does not need to graze throughout the day, but I did advise her to split her dinner into 2 meals: A brunch and dinner.  She will try to do this.  She otherwise does not need a change in regimen.  She is currently tolerating Ozempic well. - I suggested: Patient Instructions  Please continue: - Metformin ER 500 mg with breakfast - Jardiance 25 mg before breakfast - Ozempic 0.5 mg weekly in a.m.  Try to split dinner into 2 meals: brunch and dinner.   Please continue the crushed levothyroxine 225 g daily.   Take the thyroid hormone every day, with water, at least 30 minutes before breakfast, separated by at least 4 hours from: - acid reflux medications - calcium - iron - multivitamins   Continue spironolactone 25 mg twice a day.    Please return in 4 months.  - we checked her HbA1c: 7.6% (lower) - advised to check sugars at different times of the day - 1x a day, rotating check times - advised for yearly eye exams >> she is not UTD -again advised her to schedule this - return to clinic in 3-4 months   4.  Obesity class III -At last visit we increase Jardiance and  I advised her to start Ozempic -this should both help with weight loss -She was previously also exercising in the pool but not currently -In the past, she was doing a partially keto diet with intermittent fasting 16-8 -She gained a significant amount of weight during her COVID infection at the beginning of 2022 -Before the last visit, she lost 12 pounds and gained 4 since last visit  Philemon Kingdom, MD PhD [facility] Endocrinology

## 2022-01-24 NOTE — Patient Instructions (Addendum)
Please continue: - Metformin ER 500 mg with breakfast - Jardiance 25 mg before breakfast - Ozempic 0.5 mg weekly in a.m.  Try to split dinner into 2 meals: brunch and dinner.   Please continue the crushed levothyroxine 225 g daily.   Take the thyroid hormone every day, with water, at least 30 minutes before breakfast, separated by at least 4 hours from: - acid reflux medications - calcium - iron - multivitamins   Continue spironolactone 25 mg twice a day.   Please return in 4 months.

## 2022-02-06 ENCOUNTER — Encounter (INDEPENDENT_AMBULATORY_CARE_PROVIDER_SITE_OTHER): Payer: Self-pay

## 2022-02-17 DIAGNOSIS — J449 Chronic obstructive pulmonary disease, unspecified: Secondary | ICD-10-CM | POA: Diagnosis not present

## 2022-03-01 ENCOUNTER — Other Ambulatory Visit: Payer: Self-pay | Admitting: Internal Medicine

## 2022-03-11 ENCOUNTER — Encounter (INDEPENDENT_AMBULATORY_CARE_PROVIDER_SITE_OTHER): Payer: Self-pay | Admitting: Family Medicine

## 2022-03-11 ENCOUNTER — Ambulatory Visit (INDEPENDENT_AMBULATORY_CARE_PROVIDER_SITE_OTHER): Payer: Medicare Other | Admitting: Family Medicine

## 2022-03-11 VITALS — BP 128/80 | HR 81 | Temp 98.2°F | Ht 61.0 in | Wt 288.0 lb

## 2022-03-11 DIAGNOSIS — F17200 Nicotine dependence, unspecified, uncomplicated: Secondary | ICD-10-CM | POA: Diagnosis not present

## 2022-03-11 DIAGNOSIS — R0602 Shortness of breath: Secondary | ICD-10-CM | POA: Diagnosis not present

## 2022-03-11 DIAGNOSIS — E1159 Type 2 diabetes mellitus with other circulatory complications: Secondary | ICD-10-CM

## 2022-03-11 DIAGNOSIS — Z6841 Body Mass Index (BMI) 40.0 and over, adult: Secondary | ICD-10-CM

## 2022-03-11 DIAGNOSIS — I152 Hypertension secondary to endocrine disorders: Secondary | ICD-10-CM | POA: Diagnosis not present

## 2022-03-11 DIAGNOSIS — E559 Vitamin D deficiency, unspecified: Secondary | ICD-10-CM | POA: Diagnosis not present

## 2022-03-11 DIAGNOSIS — R5383 Other fatigue: Secondary | ICD-10-CM | POA: Diagnosis not present

## 2022-03-11 DIAGNOSIS — Z7984 Long term (current) use of oral hypoglycemic drugs: Secondary | ICD-10-CM | POA: Diagnosis not present

## 2022-03-11 DIAGNOSIS — E1169 Type 2 diabetes mellitus with other specified complication: Secondary | ICD-10-CM | POA: Diagnosis not present

## 2022-03-11 DIAGNOSIS — Z7985 Long-term (current) use of injectable non-insulin antidiabetic drugs: Secondary | ICD-10-CM

## 2022-03-11 DIAGNOSIS — F39 Unspecified mood [affective] disorder: Secondary | ICD-10-CM

## 2022-03-11 DIAGNOSIS — Z1331 Encounter for screening for depression: Secondary | ICD-10-CM

## 2022-03-12 LAB — LIPID PANEL WITH LDL/HDL RATIO
Cholesterol, Total: 130 mg/dL (ref 100–199)
HDL: 34 mg/dL — ABNORMAL LOW (ref >39)
LDL Chol Calc (NIH): 54 mg/dL (ref 0–99)
LDL/HDL Ratio: 1.6 ratio (ref 0.0–3.2)
Triglycerides: 268 mg/dL — ABNORMAL HIGH (ref 0–149)
VLDL Cholesterol Cal: 42 mg/dL — ABNORMAL HIGH (ref 5–40)

## 2022-03-12 LAB — COMPREHENSIVE METABOLIC PANEL
ALT: 16 IU/L (ref 0–32)
AST: 16 IU/L (ref 0–40)
Albumin/Globulin Ratio: 1.7 (ref 1.2–2.2)
Albumin: 4.3 g/dL (ref 3.8–4.9)
Alkaline Phosphatase: 86 IU/L (ref 44–121)
BUN/Creatinine Ratio: 17 (ref 9–23)
BUN: 11 mg/dL (ref 6–24)
Bilirubin Total: 0.7 mg/dL (ref 0.0–1.2)
CO2: 25 mmol/L (ref 20–29)
Calcium: 9.8 mg/dL (ref 8.7–10.2)
Chloride: 91 mmol/L — ABNORMAL LOW (ref 96–106)
Creatinine, Ser: 0.65 mg/dL (ref 0.57–1.00)
Globulin, Total: 2.6 g/dL (ref 1.5–4.5)
Glucose: 106 mg/dL — ABNORMAL HIGH (ref 70–99)
Potassium: 4.4 mmol/L (ref 3.5–5.2)
Sodium: 137 mmol/L (ref 134–144)
Total Protein: 6.9 g/dL (ref 6.0–8.5)
eGFR: 103 mL/min/{1.73_m2} (ref >59)

## 2022-03-12 LAB — VITAMIN B12: Vitamin B-12: 426 pg/mL (ref 232–1245)

## 2022-03-12 LAB — FOLATE: Folate: 7.1 ng/mL (ref >3.0)

## 2022-03-12 LAB — CBC WITH DIFFERENTIAL/PLATELET
Basophils Absolute: 0.1 10*3/uL (ref 0.0–0.2)
Basos: 0 %
EOS (ABSOLUTE): 0.2 10*3/uL (ref 0.0–0.4)
Eos: 1 %
Hematocrit: 57 % — ABNORMAL HIGH (ref 34.0–46.6)
Hemoglobin: 19.6 g/dL — ABNORMAL HIGH (ref 11.1–15.9)
Immature Grans (Abs): 0.1 10*3/uL (ref 0.0–0.1)
Immature Granulocytes: 1 %
Lymphocytes Absolute: 2 10*3/uL (ref 0.7–3.1)
Lymphs: 15 %
MCH: 31.7 pg (ref 26.6–33.0)
MCHC: 34.4 g/dL (ref 31.5–35.7)
MCV: 92 fL (ref 79–97)
Monocytes Absolute: 0.8 10*3/uL (ref 0.1–0.9)
Monocytes: 6 %
Neutrophils Absolute: 10 10*3/uL — ABNORMAL HIGH (ref 1.4–7.0)
Neutrophils: 77 %
Platelets: 167 10*3/uL (ref 150–450)
RBC: 6.18 x10E6/uL — ABNORMAL HIGH (ref 3.77–5.28)
RDW: 12.1 % (ref 11.7–15.4)
WBC: 13.1 10*3/uL — ABNORMAL HIGH (ref 3.4–10.8)

## 2022-03-12 LAB — T4, FREE: Free T4: 1.4 ng/dL (ref 0.82–1.77)

## 2022-03-12 LAB — INSULIN, RANDOM: INSULIN: 7.5 u[IU]/mL (ref 2.6–24.9)

## 2022-03-12 LAB — VITAMIN D 25 HYDROXY (VIT D DEFICIENCY, FRACTURES): Vit D, 25-Hydroxy: 39.5 ng/mL (ref 30.0–100.0)

## 2022-03-12 LAB — TSH: TSH: 3.27 u[IU]/mL (ref 0.450–4.500)

## 2022-03-17 ENCOUNTER — Telehealth: Payer: Self-pay | Admitting: Licensed Clinical Social Worker

## 2022-03-17 NOTE — Patient Outreach (Signed)
  Care Coordination   03/17/2022 Name: BRILEY BUMGARNER MRN: [id number] DOB: 1966/02/04   Care Coordination Outreach Attempts:  An unsuccessful telephone outreach was attempted today to offer the patient information about available care coordination services as a benefit of their health plan.   Follow Up Plan:  Additional outreach attempts will be made to offer the patient care coordination information and services.   Encounter Outcome:  No Answer  Care Coordination Interventions Activated:  No   Care Coordination Interventions:  No, not indicated    Casimer Lanius, [place] [phone]

## 2022-03-19 ENCOUNTER — Other Ambulatory Visit: Payer: Self-pay | Admitting: Neurology

## 2022-03-19 ENCOUNTER — Other Ambulatory Visit: Payer: Self-pay | Admitting: Internal Medicine

## 2022-03-19 NOTE — Progress Notes (Signed)
Chief Complaint:   OBESITY Autumn Massey (MR# [id number]) is a 56 y.o. female who presents for evaluation and treatment of obesity and related comorbidities. Current BMI is Body mass index is 54.42 kg/m. Autumn Massey has been struggling with her weight for many years and has been unsuccessful in either losing weight, maintaining weight loss, or reaching her healthy weight goal.  Autumn Massey is currently in the action stage of change and ready to dedicate time achieving and maintaining a healthier weight. Autumn Massey is interested in becoming our patient and working on intensive lifestyle modifications including (but not limited to) diet and exercise for weight loss.  PCP referred pt to plastic surgery and they referred her here. Her 27 year old mom lives with her. Pt is on disability for tremors- essential and doesn't work. Autumn Massey is unable to cook due to fatigue in her legs and chronic back pain. Her mom does the cooking.  Autumn Massey's habits were reviewed today and are as follows: her desired weight loss is 138 lbs, she has been heavy most of her life, she started gaining weight at age 1 after having tonsil removed, her heaviest weight ever was 367 pounds, she has significant food cravings issues, she snacks frequently in the evenings, she skips meals frequently, she is frequently drinking liquids with calories, she frequently makes poor food choices, and she struggles with emotional eating.  Depression Screen Autumn Massey's Food and Mood (modified PHQ-9) score was 16.     03/11/2022    9:52 AM  Depression screen PHQ 2/9  Decreased Interest 3  Down, Depressed, Hopeless 3  PHQ - 2 Score 6  Altered sleeping 1  Tired, decreased energy 3  Change in appetite 1  Feeling bad or failure about yourself  2  Trouble concentrating 0  Moving slowly or fidgety/restless 3  Suicidal thoughts 0  PHQ-9 Score 16  Difficult doing work/chores Very difficult   Subjective:   1. Other fatigue Autumn Massey admits to daytime  somnolence and denies waking up still tired. Patient has a history of symptoms of daytime fatigue and morning headache. Autumn Massey generally gets 6 or 7 hours of sleep per night, and states that she has generally restful sleep. Snoring "not sure" present. Apneic episodes are present. Epworth Sleepiness Score is 6. ECG = normal sinus rhythm and no acute findings, with a rate of approximately 80.  2. SOB (shortness of breath) on exertion Autumn Massey notes increasing shortness of breath with exercising and seems to be worsening over time with weight gain. She notes getting out of breath sooner with activity than she used to. This has gotten worse recently. Autumn Massey denies shortness of breath at rest or orthopnea.  3. Type 2 diabetes mellitus with other circulatory complication, without long-term current use of insulin (HCC) Autumn Massey is treated by Dr. Cruzita Lederer of endocrinology. She was diagnosed 3-4 years ago, and she also has a diagnosis of hypothyroidism, treated by [facility] as well. Pt's typical blood sugar readings run 90-140. Medication: Jardiance, Ozempic, Metformin  4. Hypertension associated with diabetes ([place]) Pt is asymptomatic and without concerns. Medication: Bisop/HCTZ, spironolactone, irbesartan   5. Smoker, current History of chronic use that has been terrible in the past. Autumn Massey is at 1 to 1.5 packs per day. She is working with PCP to quit and using Wellbutrin.  6. Mood disorder (Autumn Massey), with emotional eating PHQ = 16; Medication: fluoxetine  7. Vitamin D deficiency Pt with fatigue. She takes OTC Vit D 2000 IU daily.  Assessment/Plan:  Orders Placed This Encounter  Procedures   VITAMIN D 25 Hydroxy (Vit-D Deficiency, Fractures)   TSH   T4, free   Lipid Panel With LDL/HDL Ratio   Insulin, random   Folate   Comprehensive metabolic panel   CBC with Differential/Platelet   Vitamin B12   EKG 12-Lead    There are no discontinued medications.   No orders of the defined types were placed in  this encounter.    1. Other fatigue Autumn Massey does feel that her weight is causing her energy to be lower than it should be. Fatigue may be related to obesity, depression or many other causes. Labs will be ordered, and in the meanwhile, Autumn Massey will focus on self care including making healthy food choices, increasing physical activity and focusing on stress reduction. Obtain fasting labs today.  - EKG 12-Lead  2. SOB (shortness of breath) on exertion Autumn Massey does feel that she gets out of breath more easily that she used to when she exercises. Autumn Massey's shortness of breath appears to be obesity related and exercise induced. She has agreed to work on weight loss and gradually increase exercise to treat her exercise induced shortness of breath. Will continue to monitor closely.  3. Type 2 diabetes mellitus with other circulatory complication, without long-term current use of insulin (HCC) Good blood sugar control is important to decrease the likelihood of diabetic complications such as nephropathy, neuropathy, limb loss, blindness, coronary artery disease, and death. Intensive lifestyle modification including diet, exercise and weight loss are the first line of treatment for diabetes.   Lab/Orders today or future: - VITAMIN D 25 Hydroxy (Vit-D Deficiency, Fractures) - TSH - T4, free - Lipid Panel With LDL/HDL Ratio - Insulin, random - Folate - Comprehensive metabolic panel - CBC with Differential/Platelet - Vitamin B12  4. Hypertension associated with diabetes ([place]) BP essentially at goal. Autumn Massey is working on healthy weight loss and exercise to improve blood pressure control. We will watch for signs of hypotension as she continues her lifestyle modifications. Continue meds per PCP/specialists. Obtain fasting labs today.  5. Smoker, current Continue smoking cessation efforts through PCP and use of Wellbutrin   6. Mood disorder ([place]), with emotional eating Pt feels depression is well controlled at  this time. She declines referral to Dr. Mallie Mussel today but will consider it in the future. Continue current treatment plan.  7. Vitamin D deficiency Low Vitamin D level contributes to fatigue and are associated with obesity, breast, and colon cancer. She agrees to continue to take OTC Vitamin D ,000 IU everyday and will follow-up for routine testing of Vitamin D, at least 2-3 times per year to avoid over-replacement.  8. Depression screen Autumn Massey had a positive depression screening. Depression is commonly associated with obesity and often results in emotional eating behaviors. We will monitor this closely and work on CBT to help improve the non-hunger eating patterns. Referral to Psychology may be required if no improvement is seen as she continues in our clinic.  9. Class 3 severe obesity with serious comorbidity and body mass index (BMI) of 50.0 to 59.9 in adult, unspecified obesity type (HCC) Autumn Massey is currently in the action stage of change and her goal is to continue with weight loss efforts. I recommend Autumn Massey begin the structured treatment plan as follows:  She has agreed to the Category 3 Plan with 200 snack calories.  Exercise goals:  As is    Behavioral modification strategies: decreasing liquid calories, no skipping meals, and planning for success.  She was informed of the importance of frequent follow-up visits to maximize her success with intensive lifestyle modifications for her multiple health conditions. She was informed we would discuss her lab results at her next visit unless there is a critical issue that needs to be addressed sooner. Autumn Massey agreed to keep her next visit at the agreed upon time to discuss these results.  Objective:   Blood pressure 128/80, pulse 81, temperature 98.2 F (36.8 C), height '5\' 1"'$  (1.549 m), weight 288 lb (130.6 kg), SpO2 95 %. Body mass index is 54.42 kg/m.  EKG: Normal sinus rhythm, rate 80.  Indirect Calorimeter completed today shows a VO2 of 334  and a REE of 2304.  Her calculated basal metabolic rate is 1950 thus her basal metabolic rate is better than expected.  General: Cooperative, alert, well developed, in no acute distress. HEENT: Conjunctivae and lids unremarkable. Cardiovascular: Regular rhythm.  Lungs: Normal work of breathing. Neurologic: No focal deficits.   Lab Results  Component Value Date   CREATININE 0.65 03/11/2022   BUN 11 03/11/2022   NA 137 03/11/2022   K 4.4 03/11/2022   CL 91 (L) 03/11/2022   CO2 25 03/11/2022   Lab Results  Component Value Date   ALT 16 03/11/2022   AST 16 03/11/2022   ALKPHOS 86 03/11/2022   BILITOT 0.7 03/11/2022   Lab Results  Component Value Date   HGBA1C 7.6 (A) 01/24/2022   HGBA1C 8.4 (A) 10/21/2021   HGBA1C 8.2 (H) 07/10/2021   HGBA1C 8.4 (H) 06/17/2021   HGBA1C 8.3 (A) 04/08/2021   Lab Results  Component Value Date   INSULIN 7.5 03/11/2022   Lab Results  Component Value Date   TSH 3.270 03/11/2022   Lab Results  Component Value Date   CHOL 130 03/11/2022   HDL 34 (L) 03/11/2022   LDLCALC 54 03/11/2022   LDLDIRECT 92.0 07/10/2021   TRIG 268 (H) 03/11/2022   CHOLHDL 5 07/10/2021   Lab Results  Component Value Date   WBC 13.1 (H) 03/11/2022   HGB 19.6 (H) 03/11/2022   HCT 57.0 (H) 03/11/2022   MCV 92 03/11/2022   PLT 167 03/11/2022   Lab Results  Component Value Date   IRON 93 01/26/2019   FERRITIN 124.5 01/29/2009   Obesity Behavioral Intervention:   Approximately 15 minutes were spent on the discussion below.  ASK: We discussed the diagnosis of obesity with Autumn Massey today and Autumn Massey agreed to give [name] permission to discuss obesity behavioral modification therapy today.  ASSESS: Autumn Massey has the diagnosis of obesity and her BMI today is 54.5. Evania is in the action stage of change.   ADVISE: Autumn Massey was educated on the multiple health risks of obesity as well as the benefit of weight loss to improve her health. She was advised of the need for long term  treatment and the importance of lifestyle modifications to improve her current health and to decrease her risk of future health problems.  AGREE: Multiple dietary modification options and treatment options were discussed and Autumn Massey agreed to follow the recommendations documented in the above note.  ARRANGE: Autumn Massey was educated on the importance of frequent visits to treat obesity as outlined per CMS and USPSTF guidelines and agreed to schedule her next follow up appointment today.  Attestation Statements:   Reviewed by clinician on day of visit: allergies, medications, problem list, medical history, surgical history, family history, social history, and previous encounter notes.  I, Kathlene November, BS, CMA, am acting as  transcriptionist for [employer], DO.  I have reviewed the above documentation for accuracy and completeness, and I agree with the above. Autumn Massey, D.O.  The [place] was signed into law in 2016 which includes the topic of electronic health records.  This provides immediate access to information in MyChart.  This includes consultation notes, operative notes, office notes, lab results and pathology reports.  If you have any questions about what you read please let us know at your next visit so we can discuss your concerns and take corrective action if need be.  We are right here with you.

## 2022-03-20 DIAGNOSIS — J449 Chronic obstructive pulmonary disease, unspecified: Secondary | ICD-10-CM | POA: Diagnosis not present

## 2022-03-25 ENCOUNTER — Encounter (INDEPENDENT_AMBULATORY_CARE_PROVIDER_SITE_OTHER): Payer: Self-pay | Admitting: Family Medicine

## 2022-03-25 ENCOUNTER — Ambulatory Visit (INDEPENDENT_AMBULATORY_CARE_PROVIDER_SITE_OTHER): Payer: Medicare Other | Admitting: Family Medicine

## 2022-03-25 VITALS — BP 110/74 | HR 82 | Temp 98.4°F | Ht 61.0 in | Wt 284.0 lb

## 2022-03-25 DIAGNOSIS — E559 Vitamin D deficiency, unspecified: Secondary | ICD-10-CM

## 2022-03-25 DIAGNOSIS — E785 Hyperlipidemia, unspecified: Secondary | ICD-10-CM

## 2022-03-25 DIAGNOSIS — E1159 Type 2 diabetes mellitus with other circulatory complications: Secondary | ICD-10-CM

## 2022-03-25 DIAGNOSIS — Z7985 Long-term (current) use of injectable non-insulin antidiabetic drugs: Secondary | ICD-10-CM | POA: Diagnosis not present

## 2022-03-25 DIAGNOSIS — Z7984 Long term (current) use of oral hypoglycemic drugs: Secondary | ICD-10-CM | POA: Diagnosis not present

## 2022-03-25 DIAGNOSIS — E038 Other specified hypothyroidism: Secondary | ICD-10-CM | POA: Diagnosis not present

## 2022-03-25 DIAGNOSIS — E538 Deficiency of other specified B group vitamins: Secondary | ICD-10-CM

## 2022-03-25 DIAGNOSIS — E1169 Type 2 diabetes mellitus with other specified complication: Secondary | ICD-10-CM | POA: Diagnosis not present

## 2022-03-25 DIAGNOSIS — Z9189 Other specified personal risk factors, not elsewhere classified: Secondary | ICD-10-CM

## 2022-03-25 DIAGNOSIS — I152 Hypertension secondary to endocrine disorders: Secondary | ICD-10-CM

## 2022-03-25 DIAGNOSIS — Z6841 Body Mass Index (BMI) 40.0 and over, adult: Secondary | ICD-10-CM

## 2022-03-25 DIAGNOSIS — E669 Obesity, unspecified: Secondary | ICD-10-CM

## 2022-03-25 MED ORDER — VITAMIN D (ERGOCALCIFEROL) 1.25 MG (50000 UNIT) PO CAPS: 50000.0000 [IU] | ORAL_CAPSULE | ORAL | 0 refills | Status: DC

## 2022-03-26 DIAGNOSIS — E1169 Type 2 diabetes mellitus with other specified complication: Secondary | ICD-10-CM | POA: Insufficient documentation

## 2022-03-26 DIAGNOSIS — I152 Hypertension secondary to endocrine disorders: Secondary | ICD-10-CM | POA: Insufficient documentation

## 2022-03-26 DIAGNOSIS — E669 Obesity, unspecified: Secondary | ICD-10-CM | POA: Insufficient documentation

## 2022-03-26 DIAGNOSIS — E538 Deficiency of other specified B group vitamins: Secondary | ICD-10-CM | POA: Insufficient documentation

## 2022-03-26 DIAGNOSIS — Z9189 Other specified personal risk factors, not elsewhere classified: Secondary | ICD-10-CM | POA: Insufficient documentation

## 2022-03-29 NOTE — Progress Notes (Signed)
Chief Complaint:   OBESITY Autumn Massey is here to discuss her progress with her obesity treatment plan along with follow-up of her obesity related diagnoses. Autumn Massey is on the Category 3 Plan with 200 snack calories and states she is following her eating plan approximately 95% of the time. Autumn Massey states she is hand weights and chair exercises 10 minutes 2-3 times per week.  Today's visit was #: 2 Starting weight: 288 lbs Starting date: 03/11/2022 Today's weight: 284 lbs Today's date: 03/25/2022 Total lbs lost to date: 2 Total lbs lost since last in-office visit: 2  Interim History: Autumn Massey is here today for her first follow-up office visit since starting the program with [name].  All blood work/ lab tests that were recently ordered by myself or an outside provider were reviewed with patient today per their request.   Extended time was spent counseling her on all new disease processes that were discovered or preexisting ones that are affected by BMI.  she understands that many of these abnormalities will need to monitored regularly along with the current treatment plan of prudent dietary changes, in which we are making each and every office visit, to improve these health parameters. Autumn Massey is bored with sandwiches.  We reviewed her meal plan and all questions were answered.  Patient's food recall appears to be accurate and consistent with what is on plan when she is following it.   When eating on plan, her hunger and cravings are well controlled.    Subjective:   1. Type 2 diabetes mellitus with obesity ([place]) Discussed labs with patient today- A1c, Fasting insulin. Pt's blood sugars are the lowest she has seen at 119. Stable. Medication: Ozempic, Metformin, Jardiance.  2. Hyperlipidemia associated with type 2 diabetes mellitus ([place]) Discussed labs with patient today- FLP. Course: Not at goal. Lipid-lowering medications: Crestor.   Lab Results  Component Value Date   CHOL 130 03/11/2022    HDL 34 (L) 03/11/2022   LDLCALC 54 03/11/2022   LDLDIRECT 92.0 07/10/2021   TRIG 268 (H) 03/11/2022   CHOLHDL 5 07/10/2021   Lab Results  Component Value Date   ALT 16 03/11/2022   AST 16 03/11/2022   ALKPHOS 86 03/11/2022   BILITOT 0.7 03/11/2022   The 10-year ASCVD risk score (Arnett DK, et al., 2019) is: 9.7%   Values used to calculate the score:     Age: 76 years     Sex: Female     Is Non-Hispanic African American: No     Diabetic: Yes     Tobacco smoker: Yes     Systolic Blood Pressure: 259 mmHg     Is BP treated: Yes     HDL Cholesterol: 34 mg/dL     Total Cholesterol: 130 mg/dL  3. Hypertension associated with diabetes [facility]) Discussed labs with patient today- CMP. At goal. Medications: Aldactone, Ziac, Lasix, Avapro.   BP Readings from Last 3 Encounters:  03/25/22 110/74  03/11/22 128/80  01/24/22 118/76   Lab Results  Component Value Date   CREATININE 0.65 03/11/2022   4. Other specified hypothyroidism Discussed labs with patient today- Thyroid panel. Medication: Synthroid.   Lab Results  Component Value Date   TSH 3.270 03/11/2022   5. Vitamin B12 deficiency Discussed labs with patient today. Pt is not on supplementation currently.  6. Vitamin D deficiency She is taking OTC Vit D 2000 IU daily.  7. At risk for heart disease Soyla is at risk for heart disease  due to diabetes mellitus, hyperlipidemia, hypertension, and obesity.  Assessment/Plan:  No orders of the defined types were placed in this encounter.   Medications Discontinued During This Encounter  Medication Reason   Cholecalciferol (VITAMIN D3) 75 MCG (3000 UT) TABS      Meds ordered this encounter  Medications   Vitamin D, Ergocalciferol, (DRISDOL) 1.25 MG (50000 UNIT) CAPS capsule    Sig: Take 1 capsule (50,000 Units total) by mouth every 7 (seven) days.    Dispense:  4 capsule    Refill:  0     1. Type 2 diabetes mellitus with obesity (HCC) Good blood sugar control is  important to decrease the likelihood of diabetic complications such as nephropathy, neuropathy, limb loss, blindness, coronary artery disease, and death. Intensive lifestyle modification including diet, exercise and weight loss are the first line of treatment for diabetes.  Medication management per Dr. Cruzita Lederer at endocrinology. A1c is not at goal at 7.6.  2. Hyperlipidemia associated with type 2 diabetes mellitus ([place]) Plan: Dietary changes: Increase soluble fiber, decrease simple carbohydrates, decrease saturated fat. Exercise changes: Moderate to vigorous-intensity aerobic activity 150 minutes per week or as tolerated. We will continue to monitor along with PCP/specialists as it pertains to her weight loss journey. Elevated triglycerides at 268. Decrease fatty carbs. LDL at goal and HDL is too low at 34. Continue Crestor. Counseling done.  3. Hypertension associated with diabetes ([place]) BP at goal today. Plan: Avoid buying foods that are: processed, frozen, or prepackaged to avoid excess salt. Follow prudent nutritional plan and lose weight.  4. Other specified hypothyroidism Plan: Patient was instructed not to take MVM or iron within 4 hours of taking thyroid medications. We will continue to monitor alongside Endocrinology/PCP as it relates to her weight loss journey.  TS and FT4 at goal. Continue meds per PCP/specialists- Dr. Cruzita Lederer.  5. Vitamin B12 deficiency B12 is 426 and not at goal of greater than 500. Follow prudent nutritional plan with B12 rich foods and take OTC multivitamin with B12 in it.  6. Vitamin D deficiency Not quite at goal at 81. Discontinue OTC supplement. Start prescription   - I discussed the importance of vitamin D to the patient's health and well-being.  - I reviewed possible symptoms of low Vitamin D:  low energy, depressed mood, muscle aches, joint aches, osteoporosis etc. was reviewed with patient - low Vitamin D levels may be linked to an increased risk of  cardiovascular events and even increased risk of cancers- such as colon and breast.  - ideal vitamin D levels reviewed with patient  - I recommend pt take a 50,000 IU weekly prescription vit D - see script below   - Informed patient this may be a lifelong thing, and she was encouraged to continue to take the medicine until told otherwise.    - weight loss will likely improve availability of vitamin D, thus encouraged Franchon to continue with meal plan and their weight loss efforts to further improve this condition.  Thus, we will need to monitor levels regularly (every 3-4 mo on average) to keep levels within normal limits and prevent over supplementation. - pt's questions and concerns regarding this condition addressed.  Start- Vitamin D, Ergocalciferol, (DRISDOL) 1.25 MG (50000 UNIT) CAPS capsule; Take 1 capsule (50,000 Units total) by mouth every 7 (seven) days.  Dispense: 4 capsule; Refill: 0  7. At risk for heart disease Due to [place] current state of health and medical condition(s), she is at  a higher risk for heart disease.  This puts the patient at much greater risk to subsequently develop cardiopulmonary conditions that can significantly affect patient's quality of life in a negative manner as well.    At least 25 minutes were spent on counseling ANABELLE BUNGERT about these concerns today, and we discussed the importance of reversing risks factors of obesity, especially truncal and visceral fat, hypertension, hyperlipidemia, and pre-diabetes.  The initial goal is to lose at least 5-10% of starting weight to help reduce these risk factors.  Counseling: Intensive lifestyle modifications were discussed with [name] as the most appropriate first line of treatment.  she will continue to work on diet, exercise, and weight loss efforts.  We will continue to reassess these conditions on a fairly regular basis in an attempt to decrease the patient's overall morbidity and  mortality.  Evidence-based interventions for health behavior change were utilized today including the discussion of self monitoring techniques, problem-solving barriers, and SMART goal setting techniques.  Specifically, regarding patient's less desirable eating habits and patterns, we employed the technique of small changes when [name] has not been able to fully commit to her prudent nutritional plan.  8. Obesity BMI 53.8 Filippa is currently in the action stage of change. As such, her goal is to continue with weight loss efforts. She has agreed to change to the Category 3 Plan with breakfast and lunch options and still only 200 snack calories.   Exercise goals:  As is  Behavioral modification strategies: increasing lean protein intake, decreasing simple carbohydrates, no skipping meals, avoiding temptations, and planning for success.  Cherity has agreed to follow-up with our clinic in 2-3 weeks. She was informed of the importance of frequent follow-up visits to maximize her success with intensive lifestyle modifications for her multiple health conditions.   Objective:   Blood pressure 110/74, pulse 82, temperature 98.4 F (36.9 C), height '5\' 1"'$  (1.549 m), weight 284 lb (128.8 kg), SpO2 92 %. Body mass index is 53.66 kg/m.  General: Cooperative, alert, well developed, in no acute distress. HEENT: Conjunctivae and lids unremarkable. Cardiovascular: Regular rhythm.  Lungs: Normal work of breathing. Neurologic: No focal deficits.   Lab Results  Component Value Date   CREATININE 0.65 03/11/2022   BUN 11 03/11/2022   NA 137 03/11/2022   K 4.4 03/11/2022   CL 91 (L) 03/11/2022   CO2 25 03/11/2022   Lab Results  Component Value Date   ALT 16 03/11/2022   AST 16 03/11/2022   ALKPHOS 86 03/11/2022   BILITOT 0.7 03/11/2022   Lab Results  Component Value Date   HGBA1C 7.6 (A) 01/24/2022   HGBA1C 8.4 (A) 10/21/2021   HGBA1C 8.2 (H) 07/10/2021   HGBA1C 8.4 (H) 06/17/2021    HGBA1C 8.3 (A) 04/08/2021   Lab Results  Component Value Date   INSULIN 7.5 03/11/2022   Lab Results  Component Value Date   TSH 3.270 03/11/2022   Lab Results  Component Value Date   CHOL 130 03/11/2022   HDL 34 (L) 03/11/2022   LDLCALC 54 03/11/2022   LDLDIRECT 92.0 07/10/2021   TRIG 268 (H) 03/11/2022   CHOLHDL 5 07/10/2021   Lab Results  Component Value Date   VD25OH 39.5 03/11/2022   VD25OH 35.27 07/10/2021   VD25OH 29.64 (L) 10/03/2020   Lab Results  Component Value Date   WBC 13.1 (H) 03/11/2022   HGB 19.6 (H) 03/11/2022   HCT 57.0 (H) 03/11/2022   MCV  92 03/11/2022   PLT 167 03/11/2022   Lab Results  Component Value Date   IRON 93 01/26/2019   FERRITIN 124.5 01/29/2009    Attestation Statements:   Reviewed by clinician on day of visit: allergies, medications, problem list, medical history, surgical history, family history, social history, and previous encounter notes.  I, Kathlene November, BS, CMA, am acting as transcriptionist for [employer], DO.   I have reviewed the above documentation for accuracy and completeness, and I agree with the above. Marjory Sneddon, D.O.  The [place] was signed into law in 2016 which includes the topic of electronic health records.  This provides immediate access to information in MyChart.  This includes consultation notes, operative notes, office notes, lab results and pathology reports.  If you have any questions about what you read please let us know at your next visit so we can discuss your concerns and take corrective action if need be.  We are right here with you.

## 2022-04-08 ENCOUNTER — Ambulatory Visit (INDEPENDENT_AMBULATORY_CARE_PROVIDER_SITE_OTHER): Payer: Medicare Other | Admitting: Family Medicine

## 2022-04-10 ENCOUNTER — Encounter (INDEPENDENT_AMBULATORY_CARE_PROVIDER_SITE_OTHER): Payer: Self-pay | Admitting: Family Medicine

## 2022-04-10 ENCOUNTER — Ambulatory Visit (INDEPENDENT_AMBULATORY_CARE_PROVIDER_SITE_OTHER): Payer: Medicare Other | Admitting: Family Medicine

## 2022-04-10 VITALS — BP 121/83 | HR 82 | Temp 98.6°F | Ht 61.0 in | Wt 283.0 lb

## 2022-04-10 DIAGNOSIS — E1159 Type 2 diabetes mellitus with other circulatory complications: Secondary | ICD-10-CM

## 2022-04-10 DIAGNOSIS — E669 Obesity, unspecified: Secondary | ICD-10-CM | POA: Diagnosis not present

## 2022-04-10 DIAGNOSIS — Z6841 Body Mass Index (BMI) 40.0 and over, adult: Secondary | ICD-10-CM | POA: Diagnosis not present

## 2022-04-10 DIAGNOSIS — E559 Vitamin D deficiency, unspecified: Secondary | ICD-10-CM | POA: Diagnosis not present

## 2022-04-10 DIAGNOSIS — Z7985 Long-term (current) use of injectable non-insulin antidiabetic drugs: Secondary | ICD-10-CM

## 2022-04-10 MED ORDER — VITAMIN D (ERGOCALCIFEROL) 1.25 MG (50000 UNIT) PO CAPS: 50000.0000 [IU] | ORAL_CAPSULE | ORAL | 0 refills | Status: DC

## 2022-04-14 ENCOUNTER — Encounter: Payer: Self-pay | Admitting: Neurology

## 2022-04-14 ENCOUNTER — Encounter: Payer: Self-pay | Admitting: Internal Medicine

## 2022-04-17 NOTE — Telephone Encounter (Signed)
Ok to stop this, but realize that wellbutrin does not increase anxiety, it only helps it.  If this is not working, she may need to take this.  Let me know if patient needs or wants referral for counseling or psychiatry.

## 2022-04-17 NOTE — Telephone Encounter (Signed)
Patient would like to stop the wellbutrin to see if it will decrease anxiety. Please advise

## 2022-04-18 IMAGING — CR DG CHEST 2V
2 series · 2 of 2 positions shown · non-contrast
Comparison: 09/12/2020

CLINICAL DATA: Generalized abdominal pain.  Shortness of breath

EXAM:
CHEST - 2 VIEW

[w chest pa]
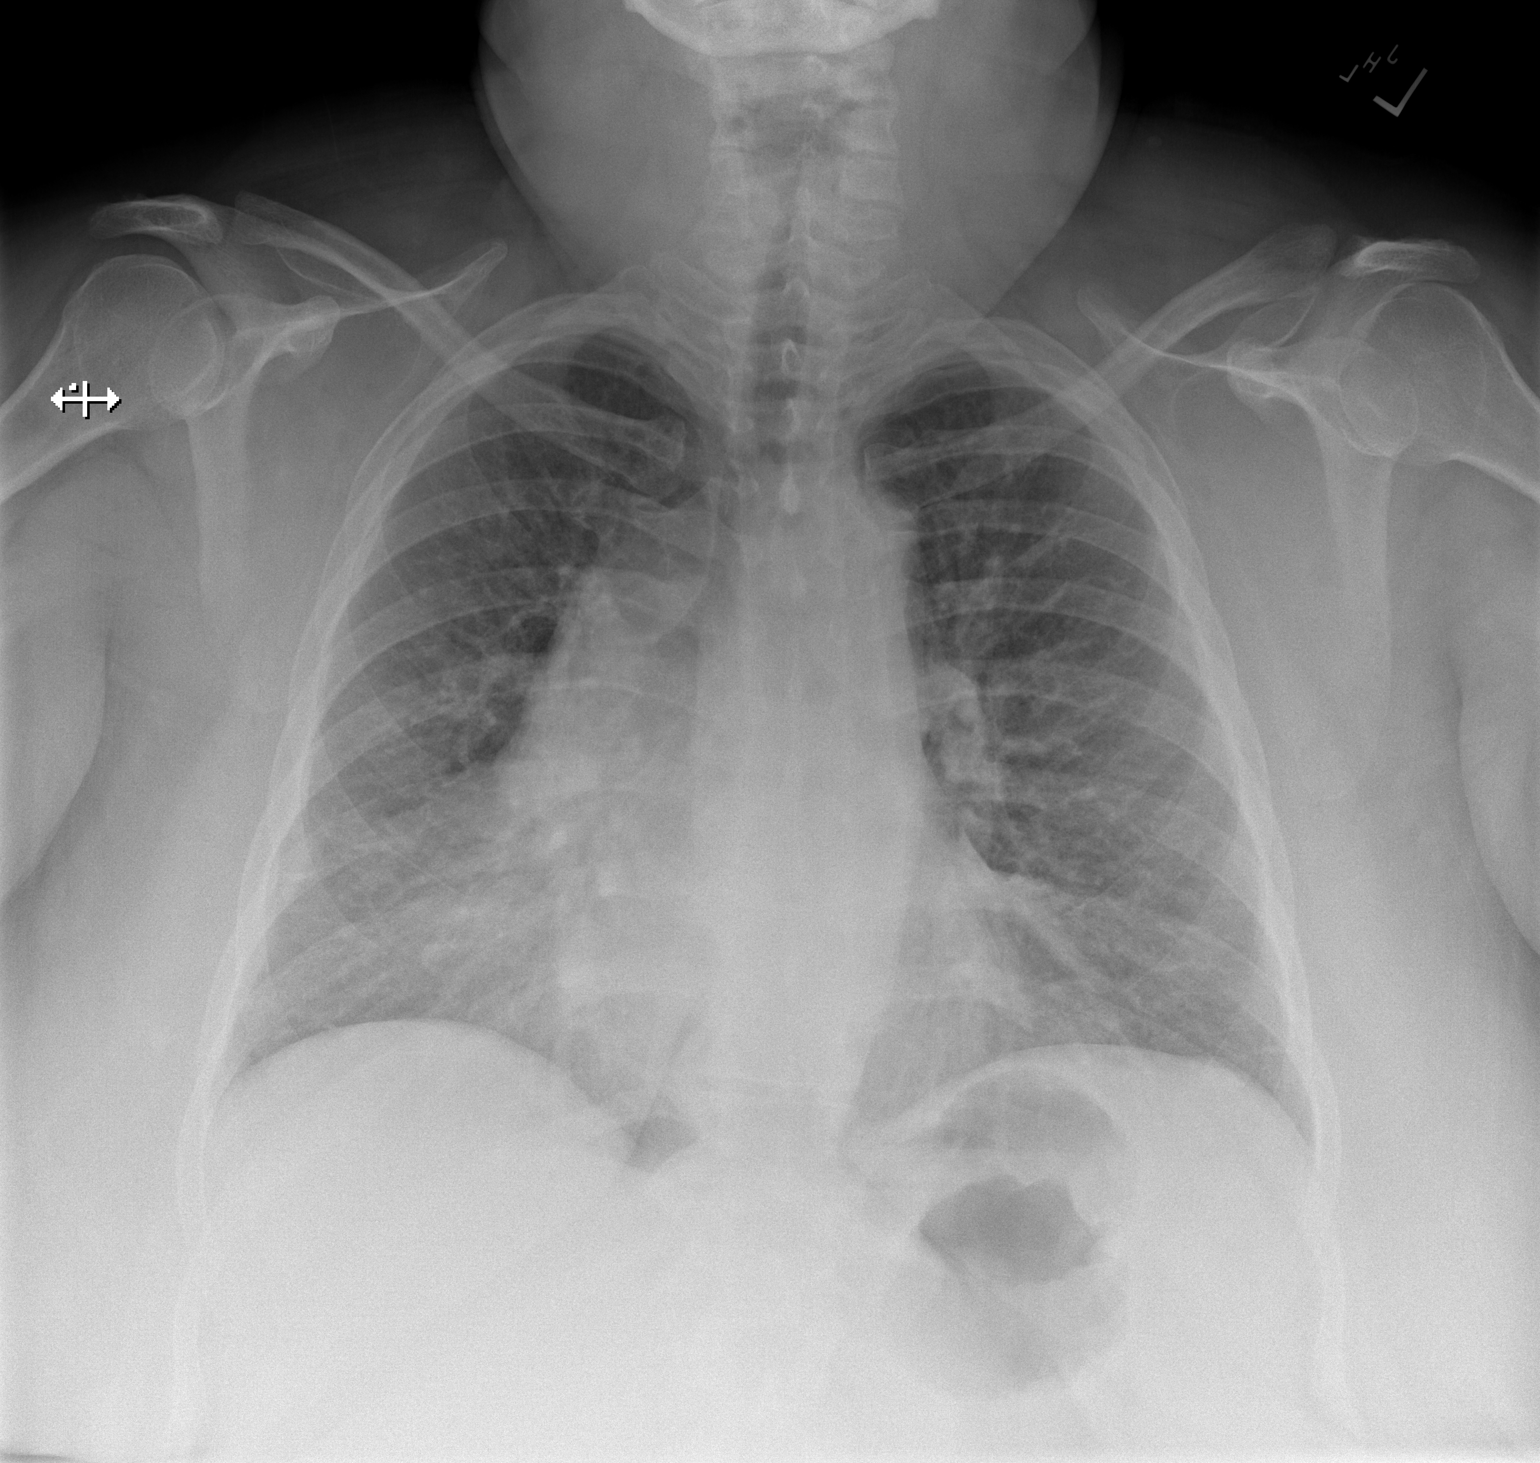

[w chest lat]
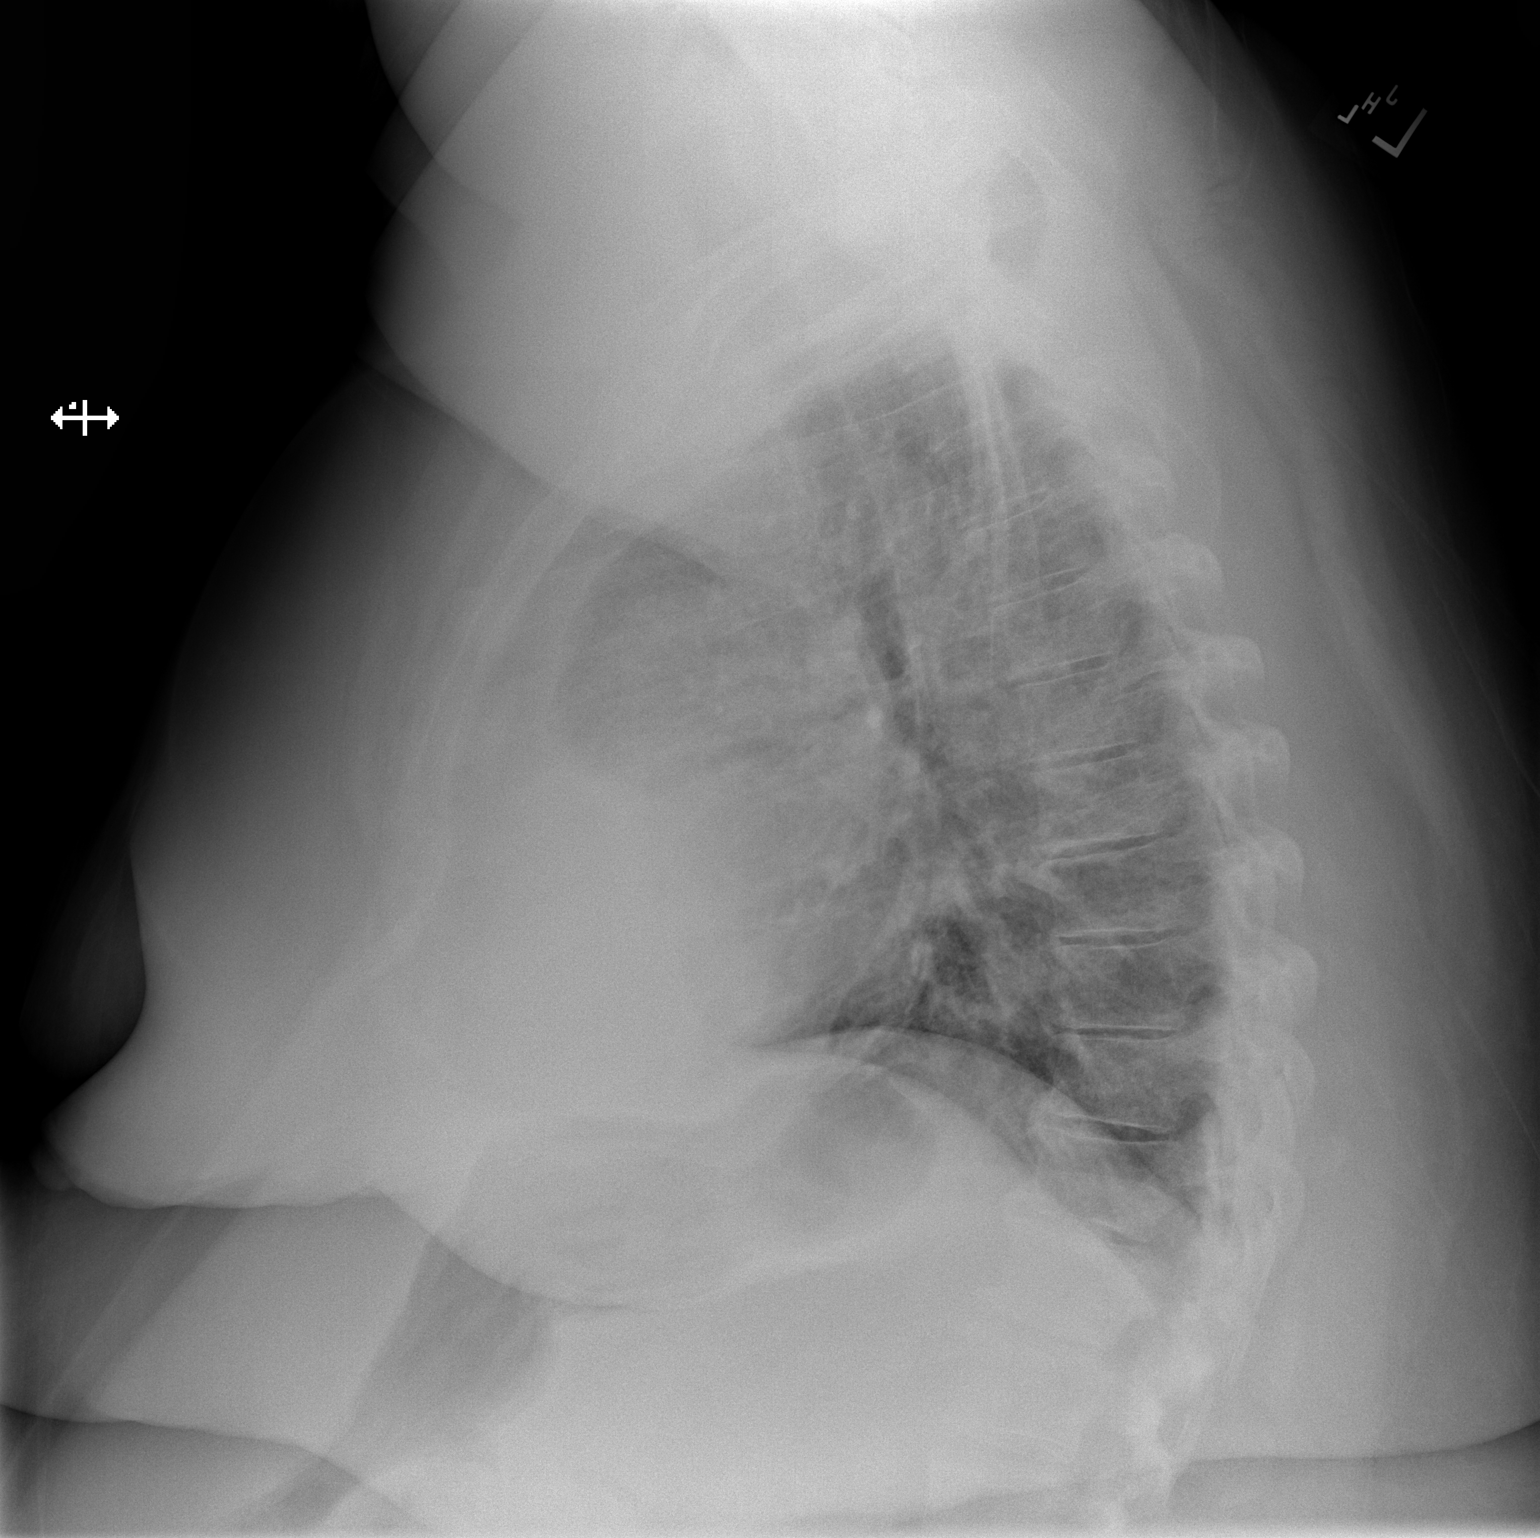

[2 of 2 positions shown; findings below may reference images not displayed]

FINDINGS: Linear scarring in the right middle lobe similar to prior study. No
acute confluent opacities or effusions. Heart is normal size. No
acute bony abnormality.
IMPRESSION: No active cardiopulmonary disease.

## 2022-04-19 DIAGNOSIS — J449 Chronic obstructive pulmonary disease, unspecified: Secondary | ICD-10-CM | POA: Diagnosis not present

## 2022-04-22 DIAGNOSIS — M50222 Other cervical disc displacement at C5-C6 level: Secondary | ICD-10-CM | POA: Diagnosis not present

## 2022-04-23 ENCOUNTER — Ambulatory Visit (INDEPENDENT_AMBULATORY_CARE_PROVIDER_SITE_OTHER): Payer: Medicare Other | Admitting: Family Medicine

## 2022-04-23 ENCOUNTER — Encounter (INDEPENDENT_AMBULATORY_CARE_PROVIDER_SITE_OTHER): Payer: Self-pay | Admitting: Family Medicine

## 2022-04-23 VITALS — BP 107/73 | HR 75 | Temp 98.2°F | Ht 61.0 in | Wt 278.0 lb

## 2022-04-23 DIAGNOSIS — E559 Vitamin D deficiency, unspecified: Secondary | ICD-10-CM

## 2022-04-23 DIAGNOSIS — Z7984 Long term (current) use of oral hypoglycemic drugs: Secondary | ICD-10-CM | POA: Diagnosis not present

## 2022-04-23 DIAGNOSIS — E669 Obesity, unspecified: Secondary | ICD-10-CM | POA: Diagnosis not present

## 2022-04-23 DIAGNOSIS — E1169 Type 2 diabetes mellitus with other specified complication: Secondary | ICD-10-CM

## 2022-04-23 DIAGNOSIS — Z6841 Body Mass Index (BMI) 40.0 and over, adult: Secondary | ICD-10-CM

## 2022-04-24 ENCOUNTER — Ambulatory Visit (INDEPENDENT_AMBULATORY_CARE_PROVIDER_SITE_OTHER): Payer: Medicare Other | Admitting: Family Medicine

## 2022-04-29 NOTE — Progress Notes (Signed)
Chief Complaint:   OBESITY Autumn Massey is here to discuss her progress with her obesity treatment plan along with follow-up of her obesity related diagnoses. Autumn Massey is on the Category 3 Plan with breakfast and lunch options and 200 snack calories and states she is following her eating plan approximately 99% of the time. Autumn Massey states she is doing chair exercises and leg lifts 15-20 minutes 3 times per week.  Today's visit was #: 3 Starting weight: 288 lbs Starting date: 03/11/2022 Today's weight: 283 lbs Today's date: 04/10/2022 Total lbs lost to date: 5 Total lbs lost since last in-office visit: 1  Interim History: Pt is struggling to eat all foods on plan, especially proteins- it's too much. She occasionally skips dinner.  Subjective:   1. Type 2 diabetes mellitus with other circulatory complication, without long-term current use of insulin (Autumn Massey) Autumn Massey stopped Ozempic 2 weeks ago due to anorexia. She did not have increased hunger or cravings. Pt's blood sugars are stable between 110-120's.  2. Vitamin D deficiency She is currently taking prescription vitamin D 50,000 IU each week. She denies nausea, vomiting or muscle weakness.  Assessment/Plan:  No orders of the defined types were placed in this encounter.   Medications Discontinued During This Encounter  Medication Reason   Vitamin D, Ergocalciferol, (DRISDOL) 1.25 MG (50000 UNIT) CAPS capsule Reorder     Meds ordered this encounter  Medications   DISCONTD: Vitamin D, Ergocalciferol, (DRISDOL) 1.25 MG (50000 UNIT) CAPS capsule    Sig: Take 1 capsule (50,000 Units total) by mouth every 7 (seven) days.    Dispense:  4 capsule    Refill:  0     1. Type 2 diabetes mellitus with other circulatory complication, without long-term current use of insulin ([facility]) Good blood sugar control is important to decrease the likelihood of diabetic complications such as nephropathy, neuropathy, limb loss, blindness, coronary artery disease, and  death. Intensive lifestyle modification including diet, exercise and weight loss are the first line of treatment for diabetes.  Continue meds per Dr. Cruzita Lederer. Continue prudent nutritional plan and weight loss.  2. Vitamin D deficiency Low Vitamin D level contributes to fatigue and are associated with obesity, breast, and colon cancer. She agrees to continue to take prescription Vitamin D '@50'$ ,000 IU every week and will follow-up for routine testing of Vitamin D, at least 2-3 times per year to avoid over-replacement.  Refill- Vitamin D, Ergocalciferol, (DRISDOL) 1.25 MG (50000 UNIT) CAPS capsule; Take 1 capsule (50,000 Units total) by mouth every 7 (seven) days.  Dispense: 4 capsule; Refill: 0  3. Obesity BMI 53.5 Autumn Massey is currently in the action stage of change. As such, her goal is to continue with weight loss efforts. She has agreed to the Category 3 Plan with breakfast and lunch options and 200 snack calories.   Strategies to break up foods into small meals discussed with pt.  Exercise goals:  As is  Behavioral modification strategies: increasing lean protein intake, no skipping meals, and planning for success.  Autumn Massey has agreed to follow-up with our clinic in 2-3 weeks. She was informed of the importance of frequent follow-up visits to maximize her success with intensive lifestyle modifications for her multiple health conditions.   Objective:   Blood pressure 121/83, pulse 82, temperature 98.6 F (37 C), height '5\' 1"'$  (1.549 m), weight 283 lb (128.4 kg), SpO2 92 %. Body mass index is 53.47 kg/m.  General: Cooperative, alert, well developed, in no acute distress. HEENT: Conjunctivae and  lids unremarkable. Cardiovascular: Regular rhythm.  Lungs: Normal work of breathing. Neurologic: No focal deficits.   Lab Results  Component Value Date   CREATININE 0.65 03/11/2022   BUN 11 03/11/2022   NA 137 03/11/2022   K 4.4 03/11/2022   CL 91 (L) 03/11/2022   CO2 25 03/11/2022   Lab  Results  Component Value Date   ALT 16 03/11/2022   AST 16 03/11/2022   ALKPHOS 86 03/11/2022   BILITOT 0.7 03/11/2022   Lab Results  Component Value Date   HGBA1C 7.6 (A) 01/24/2022   HGBA1C 8.4 (A) 10/21/2021   HGBA1C 8.2 (H) 07/10/2021   HGBA1C 8.4 (H) 06/17/2021   HGBA1C 8.3 (A) 04/08/2021   Lab Results  Component Value Date   INSULIN 7.5 03/11/2022   Lab Results  Component Value Date   TSH 3.270 03/11/2022   Lab Results  Component Value Date   CHOL 130 03/11/2022   HDL 34 (L) 03/11/2022   LDLCALC 54 03/11/2022   LDLDIRECT 92.0 07/10/2021   TRIG 268 (H) 03/11/2022   CHOLHDL 5 07/10/2021   Lab Results  Component Value Date   VD25OH 39.5 03/11/2022   VD25OH 35.27 07/10/2021   VD25OH 29.64 (L) 10/03/2020   Lab Results  Component Value Date   WBC 13.1 (H) 03/11/2022   HGB 19.6 (H) 03/11/2022   HCT 57.0 (H) 03/11/2022   MCV 92 03/11/2022   PLT 167 03/11/2022   Lab Results  Component Value Date   IRON 93 01/26/2019   FERRITIN 124.5 01/29/2009    Obesity Behavioral Intervention:   Approximately 15 minutes were spent on the discussion below.  ASK: We discussed the diagnosis of obesity with Autumn Massey today and Autumn Massey agreed to give [name] permission to discuss obesity behavioral modification therapy today.  ASSESS: Autumn Massey has the diagnosis of obesity and her BMI today is 53.5. Autumn Massey is in the action stage of change.   ADVISE: Autumn Massey was educated on the multiple health risks of obesity as well as the benefit of weight loss to improve her health. She was advised of the need for long term treatment and the importance of lifestyle modifications to improve her current health and to decrease her risk of future health problems.  AGREE: Multiple dietary modification options and treatment options were discussed and Autumn Massey agreed to follow the recommendations documented in the above note.  ARRANGE: Autumn Massey was educated on the importance of frequent visits to treat obesity as  outlined per CMS and USPSTF guidelines and agreed to schedule her next follow up appointment today.  Attestation Statements:   Reviewed by clinician on day of visit: allergies, medications, problem list, medical history, surgical history, family history, social history, and previous encounter notes.  I, Autumn Massey, BS, CMA, am acting as transcriptionist for [employer], DO.   I have reviewed the above documentation for accuracy and completeness, and I agree with the above. Autumn Massey, D.O.  The [place] was signed into law in 2016 which includes the topic of electronic health records.  This provides immediate access to information in MyChart.  This includes consultation notes, operative notes, office notes, lab results and pathology reports.  If you have any questions about what you read please let us know at your next visit so we can discuss your concerns and take corrective action if need be.  We are right here with you.

## 2022-04-30 ENCOUNTER — Telehealth: Payer: Self-pay

## 2022-04-30 ENCOUNTER — Ambulatory Visit (INDEPENDENT_AMBULATORY_CARE_PROVIDER_SITE_OTHER): Payer: Medicare Other

## 2022-04-30 VITALS — Ht 61.0 in | Wt 278.0 lb

## 2022-04-30 DIAGNOSIS — Z0000 Encounter for general adult medical examination without abnormal findings: Secondary | ICD-10-CM

## 2022-04-30 MED ORDER — VITAMIN D (ERGOCALCIFEROL) 1.25 MG (50000 UNIT) PO CAPS: 50000.0000 [IU] | ORAL_CAPSULE | ORAL | 0 refills | Status: DC

## 2022-04-30 NOTE — Telephone Encounter (Signed)
Called patient lvm to return call, to complete AWV. Patient may reschedule for the next available appointment [facility] or CMA. -S. Wilmary Levit,LPN 

## 2022-04-30 NOTE — Telephone Encounter (Signed)
Patient returned call.  AWV completed.

## 2022-04-30 NOTE — Progress Notes (Signed)
Virtual Visit via Telephone Note  I connected with  Autumn Massey on 04/30/22 at  2:15 PM EDT by telephone and verified that I am speaking with the correct person using two identifiers.  Location: Patient: Home Provider: [place] Persons participating in the virtual visit: [place]   I discussed the limitations, risks, security and privacy concerns of performing an evaluation and management service by telephone and the availability of in person appointments. The patient expressed understanding and agreed to proceed.  Interactive audio and video telecommunications were attempted between this nurse and patient, however failed, due to patient having technical difficulties OR patient did not have access to video capability.  We continued and completed visit with audio only.  Some vital signs may be absent or patient reported.   Sheral Flow, LPN  Subjective:   Autumn Massey is a 56 y.o. female who presents for Medicare Annual (Subsequent) preventive examination.  Review of Systems     Cardiac Risk Factors include: advanced age (>14mn, >>57women);diabetes mellitus;dyslipidemia;hypertension;female gender;obesity (BMI >30kg/m2);smoking/ tobacco exposure;family history of premature cardiovascular disease     Objective:    Today's Vitals   04/30/22 1524  Weight: 278 lb (126.1 kg)  Height: _0  (1.549 m)  PainSc: 0-No pain   Body mass index is 52.53 kg/m.     04/30/2022    3:08 PM 11/05/2021    2:16 PM 06/17/2021   10:31 AM 03/06/2021    7:42 PM 01/24/2021    9:51 AM 07/23/2020    1:24 PM 01/26/2020    1:30 PM  Advanced Directives  Does Patient Have a Medical Advance Directive? _1  No No  Would patient like information on creating a medical advance directive? No - Patient declined  Yes (MAU/Ambulatory/Procedural Areas - Information given) No - Patient declined   No - Patient declined    Current Medications (verified) Outpatient  Encounter Medications as of 04/30/2022  Medication Sig   Accu-Chek Softclix Lancets lancets Use as instructed to check blood sugar once daily   albuterol (PROVENTIL) (2.5 MG/3ML) 0.083% nebulizer solution Take 3 mLs (2.5 mg total) by nebulization every 6 (six) hours as needed for wheezing or shortness of breath.   albuterol (VENTOLIN HFA) 108 (90 Base) MCG/ACT inhaler USE 2 INHALATIONS BY MOUTH  EVERY 6 HOURS AS NEEDED   aspirin EC 81 MG tablet Take 81 mg by mouth daily.   bisoprolol-hydrochlorothiazide (ZIAC) 5-6.25 MG tablet TAKE 1 TABLET BY MOUTH  DAILY   Blood Glucose Monitoring Suppl (ACCU-CHEK GUIDE) w/Device KIT Use as instructed to check blood sugar 2X daily   BREO ELLIPTA 100-25 MCG/ACT AEPB INHALE 1 INHALATION BY  MOUTH INTO THE LUNGS IN THE MORNING   buPROPion (WELLBUTRIN XL) 150 MG 24 hr tablet Take 1 tablet (150 mg total) by mouth daily.   clonazePAM (KLONOPIN) 0.5 MG tablet Take 1 tablet (0.5 mg total) by mouth 2 (two) times daily as needed for anxiety.   cyclobenzaprine (FLEXERIL) 5 MG tablet TAKE 1 TABLET BY MOUTH 3  TIMES DAILY AS NEEDED FOR  MUSCLE SPASM(S)   Diclofenac Sodium (PENNSAID) 2 % SOLN Place 2 g onto the skin 2 (two) times daily.   empagliflozin (JARDIANCE) 25 MG TABS tablet Take 1 tablet (25 mg total) by mouth daily before breakfast.   fluconazole (DIFLUCAN) 150 MG tablet 1 tab by mouth every 3 days as needed   FLUoxetine (PROZAC) 40 MG capsule TAKE 1 CAPSULE BY MOUTH  DAILY  furosemide (LASIX) 40 MG tablet TAKE 1 TABLET BY MOUTH DAILY   Gabapentin Enacarbil (HORIZANT) 600 MG TBCR Take 1 tablet (600 mg total) by mouth at bedtime.   glucose blood (ACCU-CHEK GUIDE) test strip Use as instructed   HYDROcodone-acetaminophen (NORCO/VICODIN) 5-325 MG tablet Take 1 tablet by mouth every 6 (six) hours as needed for moderate pain.   irbesartan (AVAPRO) 75 MG tablet TAKE 1 TABLET BY MOUTH AT  BEDTIME   Lancets Misc. (ACCU-CHEK FASTCLIX LANCET) KIT Use to check blood sugar once  a day   levothyroxine (SYNTHROID) 200 MCG tablet TAKE 1 TABLET BY MOUTH DAILY   levothyroxine (SYNTHROID) 25 MCG tablet Take 1 tablet (25 mcg total) by mouth daily before breakfast. Take with 200 mcg to equal 225 mcg daily   metFORMIN (GLUCOPHAGE-XR) 500 MG 24 hr tablet TAKE 1 TABLET BY MOUTH  DAILY WITH BREAKFAST   montelukast (SINGULAIR) 10 MG tablet TAKE 1 TABLET BY MOUTH AT  BEDTIME   Multiple Vitamin (MULTIVITAMIN WITH MINERALS) TABS tablet Take 1 tablet by mouth daily.   nystatin cream (MYCOSTATIN) Apply 1 application. topically 2 (two) times daily.   ondansetron (ZOFRAN ODT) 4 MG disintegrating tablet Take 1 tablet (4 mg total) by mouth every 8 (eight) hours as needed for nausea or vomiting.   oxyCODONE (OXY IR/ROXICODONE) 5 MG immediate release tablet Take 5 mg by mouth 4 (four) times daily as needed.   OXYGEN 2lpm with sleep only  [facility]   primidone (MYSOLINE) 50 MG tablet TAKE 1 TABLET BY MOUTH IN THE  MORNING AND 3 TABLETS BY MOUTH  AT BEDTIME   rosuvastatin (CRESTOR) 40 MG tablet Take 1 tablet (40 mg total) by mouth daily.   Semaglutide,0.25 or 0.5MG/DOS, (OZEMPIC, 0.25 OR 0.5 MG/DOSE,) 2 MG/1.5ML SOPN Inject 0.5 mg into the skin once a week.   spironolactone (ALDACTONE) 25 MG tablet TAKE 1 TABLET BY MOUTH TWICE  DAILY   Vitamin D, Ergocalciferol, (DRISDOL) 1.25 MG (50000 UNIT) CAPS capsule Take 1 capsule (50,000 Units total) by mouth every 7 (seven) days.   No facility-administered encounter medications on file as of 04/30/2022.    Allergies (verified) Cymbalta [duloxetine hcl]   History: Past Medical History:  Diagnosis Date   Anxiety    ANXIETY DEPRESSION 01/29/2009   04/26/2019- no current   Arthritis    Asthma    CHF (congestive heart failure) ([place])    DEPRESSION 11/19/2007   Diabetes ([place])    Dyspnea    FREQUENCY, URINARY 10/17/2009   HOH (hard of hearing)    HYPERLIPIDEMIA 11/19/2007   HYPERTENSION 11/19/2007   HYPOTHYROIDISM 11/19/2007   Morbid obesity ([place])     Neuropathy 01/26/2019   Neuropathy    PNEUMONIA 08/06/2010   Pneumonia    POLYCYTHEMIA 01/29/2009   Preventative health care 04/06/2011   Sleep apnea    mild sleep apnea, on o2 at 2l at nighttime   [place] CCE 02/08/2009   Venous insufficiency 07/19/2012   Past Surgical History:  Procedure Laterality Date   ANTERIOR CERVICAL CORPECTOMY N/A 06/19/2021   Procedure: Cerivcal Six Corpectomy;  Surgeon: Ashok Pall, MD;  Location: [facility];  Service: Neurosurgery;  Laterality: N/A;   DILITATION & CURRETTAGE/HYSTROSCOPY WITH NOVASURE ABLATION N/A 04/28/2019   Procedure: DILATATION & CURETTAGE/HYSTEROSCOPY WITH NOVASURE ABLATION;  Surgeon: Molli Posey, MD;  Location: [facility];  Service: Gynecology;  Laterality: N/A;  [employer] rep will be here confirmed on 04/20/19   TONSILLECTOMY     ULNAR TUNNEL RELEASE Left 02/22/2013  Procedure: LEFT ULNAR NERVE DECOMPRESSION ;  Surgeon: Tennis Must, MD;  Location: [facility];  Service: Orthopedics;  Laterality: Left;   Family History  Problem Relation Age of Onset   Cancer Mother        melanoma    Heart disease Mother    High blood pressure Mother    Stroke Mother    Thyroid disease Mother    Anxiety disorder Mother    High Cholesterol Sister    Psoriasis Brother    Osteoarthritis Maternal Grandmother 2   Scoliosis Maternal Grandmother    Cancer Maternal Grandfather        prostate cancer   Alcohol abuse Other    Arthritis Other    Hypertension Other    Social History   Socioeconomic History   Marital status: Divorced    Spouse name: Not on file   Number of children: 0   Years of education: 12   Highest education level: 12th grade  Occupational History   Occupation: Disabled    Employer: [employer]  Tobacco Use   Smoking status: Every Day    Packs/day: 0.50    Years: 22.00    Total pack years: 11.00    Types: Cigarettes   Smokeless tobacco: Never  Vaping Use   Vaping  Use: Never used  Substance and Sexual Activity   Alcohol use: Not Currently    Comment: rare   Drug use: No   Sexual activity: Never  Other Topics Concern   Not on file  Social History Narrative   Patient is right-handed.   Social Determinants of Health   Financial Resource Strain: Low Risk  (04/30/2022)   Overall Financial Resource Strain (CARDIA)    Difficulty of Paying Living Expenses: Not hard at all  Food Insecurity: No Food Insecurity (04/30/2022)   Hunger Vital Sign    Worried About Running Out of Food in the Last Year: Never true    Ran Out of Food in the Last Year: Never true  Transportation Needs: No Transportation Needs (04/30/2022)   PRAPARE - Hydrologist (Medical): No    Lack of Transportation (Non-Medical): No  Physical Activity: Sufficiently Active (04/30/2022)   Exercise Vital Sign    Days of Exercise per Week: 7 days    Minutes of Exercise per Session: 30 min  Stress: No Stress Concern Present (04/30/2022)   [place]    Feeling of Stress : Not at all  Social Connections: [place] (04/30/2022)   Social Connection and Isolation Panel [NHANES]    Frequency of Communication with Friends and Family: More than three times a week    Frequency of Social Gatherings with Friends and Family: More than three times a week    Attends Religious Services: More than 4 times per year    Active Member of [employer] or Organizations: Yes    Attends Music therapist: More than 4 times per year    Marital Status: Married    Tobacco Counseling Ready to quit: Not Answered Counseling given: Not Answered   Clinical Intake:  Pre-visit preparation completed: Yes  Pain : No/denies pain Pain Score: 0-No pain     BMI - recorded: 53.66 (03/25/2022) Nutritional Status: BMI > 30  Obese Nutritional Risks: None Diabetes: No  How often do you need to have someone help  you when you read instructions, pamphlets, or other written materials from your doctor  or pharmacy?: 1 - Never What is the last grade level you completed in school?: HSG  Nutrition Risk Assessment:  Has the patient had any N/V/D within the last 2 months?  No  Does the patient have any non-healing wounds?  No  Has the patient had any unintentional weight loss or weight gain?  No   Diabetes:  Is the patient diabetic?  Yes  If diabetic, was a CBG obtained today?  No  Did the patient bring in their glucometer from home?  No  How often do you monitor your CBG's? Once a day.   Financial Strains and Diabetes Management:  Are you having any financial strains with the device, your supplies or your medication? No .  Does the patient want to be seen by Chronic Care Management for management of their diabetes?  No  Would the patient like to be referred to a Nutritionist or for Diabetic Management?  No   Diabetic Exams:  Diabetic Eye Exam: Completed 03/2022 Diabetic Foot Exam: Completed 07/10/2021   Interpreter Needed?: No  Information entered by :: Lisette Abu, LPN.   Activities of Daily Living    04/30/2022    3:31 PM 06/17/2021   10:36 AM  In your present state of health, do you have any difficulty performing the following activities:  Hearing? 0   Vision? 0   Difficulty concentrating or making decisions? 0   Walking or climbing stairs? 0   Dressing or bathing? 0   Doing errands, shopping? 0 0  Preparing Food and eating ? N   Using the Toilet? N   In the past six months, have you accidently leaked urine? N   Do you have problems with loss of bowel control? N   Managing your Medications? N   Managing your Finances? N   Housekeeping or managing your Housekeeping? N     Patient Care Team: Biagio Borg, MD as PCP - Olivia Mackie, [facility], [place] as Referring Physician (Optometry)  Indicate any recent Medical Services you may have received from other than [facility] providers in the  past year (date may be approximate).     Assessment:   This is a routine wellness examination for [place].  Hearing/Vision screen Hearing Screening - Comments:: Denies hearing difficulties   Vision Screening - Comments:: Wears rx glasses - up to date with routine eye exams with [facility] [place] [facility], [facility].   Dietary issues and exercise activities discussed: Current Exercise Habits: Structured exercise class;Home exercise routine, Type of exercise: walking;stretching;strength training/weights, Time (Minutes): 30, Frequency (Times/Week): 7, Weekly Exercise (Minutes/Week): 210, Intensity: Moderate   Goals Addressed             This Visit's Progress    My goal is to continue to loss weight, make better eating choices so that I can have gastric surgery.  I feel better and my glucose is now under control.        Depression Screen    04/30/2022    3:18 PM 03/11/2022    9:52 AM 10/21/2021    1:33 PM 10/03/2020    4:27 PM 01/26/2020    1:30 PM 01/26/2019    9:30 AM 01/08/2016   10:38 AM  PHQ 2/9 Scores  PHQ - 2 Score 0 _0 0 1  PHQ- 9 Score  16 5        Fall Risk    04/30/2022    3:09 PM 11/05/2021    2:15 PM 01/24/2021  9:51 AM 10/03/2020    4:27 PM 07/23/2020    1:24 PM  Fall Risk   Falls in the past year? 0 0 0 0 0  Number falls in past yr: 0 0 0  0  Injury with Fall? 0 0 0  0  Risk for fall due to : No Fall Risks      Follow up Falls prevention discussed        FALL RISK PREVENTION PERTAINING TO THE HOME:  Any stairs in or around the home? No  If so, are there any without handrails? No  Home free of loose throw rugs in walkways, pet beds, electrical cords, etc? Yes  Adequate lighting in your home to reduce risk of falls? Yes   ASSISTIVE DEVICES UTILIZED TO PREVENT FALLS:  Life alert? No  Use of a cane, walker or w/c? No  Grab bars in the bathroom? Yes  Shower chair or bench in shower? No  Elevated toilet seat or a handicapped toilet? No   TIMED UP AND GO:  Was the test  performed? No . Phone Visit  Cognitive Function:        04/30/2022    3:32 PM  6CIT Screen  What Year? 0 points  What month? 0 points  What time? 0 points  Count back from 20 0 points  Months in reverse 0 points  Repeat phrase 0 points  Total Score 0 points    Immunizations Immunization History  Administered Date(s) Administered   Influenza Split 04/11/2011, 03/15/2012, 03/31/2015   Influenza Whole 02/20/2010   Influenza,inj,Quad PF,6+ Mos 08/11/2013, 03/07/2014, 03/10/2017, 03/02/2018, 04/05/2021   Pneumococcal Conjugate-13 11/22/2014   Pneumococcal Polysaccharide-23 11/04/2016   Td 01/29/2009   Tdap 01/26/2019    TDAP status: Up to date  Flu Vaccine status: Due, Education has been provided regarding the importance of this vaccine. Advised may receive this vaccine at local pharmacy or Health Dept. Aware to provide a copy of the vaccination record if obtained from local pharmacy or Health Dept. Verbalized acceptance and understanding.  Pneumococcal vaccine status: Up to date  Covid-19 vaccine status: Declined, Education has been provided regarding the importance of this vaccine but patient still declined. Advised may receive this vaccine at local pharmacy or Health Dept.or vaccine clinic. Aware to provide a copy of the vaccination record if obtained from local pharmacy or Health Dept. Verbalized acceptance and understanding.  Qualifies for Shingles Vaccine? Yes   Zostavax completed No   Shingrix Completed?: No.    Education has been provided regarding the importance of this vaccine. Patient has been advised to call insurance company to determine out of pocket expense if they have not yet received this vaccine. Advised may also receive vaccine at local pharmacy or Health Dept. Verbalized acceptance and understanding.  Screening Tests Health Maintenance  Topic Date Due   COVID-19 Vaccine (1) Never done   Zoster Vaccines- Shingrix (1 of 2) Never done   PAP SMEAR-Modifier   02/29/2020   COLONOSCOPY (Pts 45-47yr Insurance coverage will need to be confirmed)  04/30/2020   OPHTHALMOLOGY EXAM  06/06/2020   INFLUENZA VACCINE  01/28/2022   Diabetic kidney evaluation - Urine ACR  07/10/2022   FOOT EXAM  07/10/2022   HEMOGLOBIN A1C  07/27/2022   MAMMOGRAM  02/16/2023   Diabetic kidney evaluation - GFR measurement  03/12/2023   Medicare Annual Wellness (AWV)  05/01/2023   TETANUS/TDAP  01/25/2029   Hepatitis C Screening  Completed   HIV Screening  Completed  HPV VACCINES  Aged Out    Health Maintenance  Health Maintenance Due  Topic Date Due   COVID-19 Vaccine (1) Never done   Zoster Vaccines- Shingrix (1 of 2) Never done   PAP SMEAR-Modifier  02/29/2020   COLONOSCOPY (Pts 45-34yr Insurance coverage will need to be confirmed)  04/30/2020   OPHTHALMOLOGY EXAM  06/06/2020   INFLUENZA VACCINE  01/28/2022    Colorectal cancer screening: Type of screening: Colonoscopy. Completed 04/30/2010. Repeat every 10 years (patient requested to wait until 2024)  Mammogram status: Completed 02/15/2021. Repeat every year (patient will schedule for 2023; received letter)  Bone Density status: never done  Lung Cancer Screening: (Low Dose CT Chest recommended if Age 56-80years, 30 pack-year currently smoking OR have quit w/in 15years.) does not qualify.   Lung Cancer Screening Referral: no  Additional Screening:  Hepatitis C Screening: does qualify; Completed 01/27/2020  Vision Screening: Recommended annual ophthalmology exams for early detection of glaucoma and other disorders of the eye. Is the patient up to date with their annual eye exam?  Yes  Who is the provider or what is the name of the office in which the patient attends annual eye exams? [facility] [place] If pt is not established with a provider, would they like to be referred to a provider to establish care? No .   Dental Screening: Recommended annual dental exams for proper oral hygiene  Community Resource  Referral / Chronic Care Management: CRR required this visit?  No   CCM required this visit?  No      Plan:     I have personally reviewed and noted the following in the patient's chart:   Medical and social history Use of alcohol, tobacco or illicit drugs  Current medications and supplements including opioid prescriptions. Patient is currently taking opioid prescriptions. Information provided to patient regarding non-opioid alternatives. Patient advised to discuss non-opioid treatment plan with their provider. Functional ability and status Nutritional status Physical activity Advanced directives List of other physicians Hospitalizations, surgeries, and ER visits in previous 12 months Vitals Screenings to include cognitive, depression, and falls Referrals and appointments  In addition, I have reviewed and discussed with patient certain preventive protocols, quality metrics, and best practice recommendations. A written personalized care plan for preventive services as well as general preventive health recommendations were provided to patient.     SSheral Flow LPN   [id number]  Nurse Notes: N/A

## 2022-04-30 NOTE — Patient Instructions (Signed)
Autumn Massey , Thank you for taking time to come for your Medicare Wellness Visit. I appreciate your ongoing commitment to your health goals. Please review the following plan we discussed and let me know if I can assist you in the future.   These are the goals we discussed:  Goals      My goal is to continue to loss weight, make better eating choices so that I can have gastric surgery.  I feel better and my glucose is now under control.        This is a list of the screening recommended for you and due dates:  Health Maintenance  Topic Date Due   COVID-19 Vaccine (1) Never done   Zoster (Shingles) Vaccine (1 of 2) Never done   Pap Smear  02/29/2020   Colon Cancer Screening  04/30/2020   Eye exam for diabetics  06/06/2020   Flu Shot  01/28/2022   Yearly kidney health urinalysis for diabetes  07/10/2022   Complete foot exam   07/10/2022   Hemoglobin A1C  07/27/2022   Mammogram  02/16/2023   Yearly kidney function blood test for diabetes  03/12/2023   Medicare Annual Wellness Visit  05/01/2023   Tetanus Vaccine  01/25/2029   Hepatitis C Screening: USPSTF Recommendation to screen - Ages 18-79 yo.  Completed   HIV Screening  Completed   HPV Vaccine  Aged Out    Advanced directives: No  Conditions/risks identified: Yes; Type II Diabetes Mellitus  Next appointment: Follow up in one year for your annual wellness visit.   Preventive Care 40-64 Years, Female Preventive care refers to lifestyle choices and visits with your health care provider that can promote health and wellness. What does preventive care include? A yearly physical exam. This is also called an annual well check. Dental exams once or twice a year. Routine eye exams. Ask your health care provider how often you should have your eyes checked. Personal lifestyle choices, including: Daily care of your teeth and gums. Regular physical activity. Eating a healthy diet. Avoiding tobacco and drug use. Limiting alcohol  use. Practicing safe sex. Taking low-dose aspirin daily starting at age 57. Taking vitamin and mineral supplements as recommended by your health care provider. What happens during an annual well check? The services and screenings done by your health care provider during your annual well check will depend on your age, overall health, lifestyle risk factors, and family history of disease. Counseling  Your health care provider may ask you questions about your: Alcohol use. Tobacco use. Drug use. Emotional well-being. Home and relationship well-being. Sexual activity. Eating habits. Work and work Statistician. Method of birth control. Menstrual cycle. Pregnancy history. Screening  You may have the following tests or measurements: Height, weight, and BMI. Blood pressure. Lipid and cholesterol levels. These may be checked every 5 years, or more frequently if you are over 72 years old. Skin check. Lung cancer screening. You may have this screening every year starting at age 4 if you have a 30-pack-year history of smoking and currently smoke or have quit within the past 15 years. Fecal occult blood test (FOBT) of the stool. You may have this test every year starting at age 88. Flexible sigmoidoscopy or colonoscopy. You may have a sigmoidoscopy every 5 years or a colonoscopy every 10 years starting at age 39. Hepatitis C blood test. Hepatitis B blood test. Sexually transmitted disease (STD) testing. Diabetes screening. This is done by checking your blood sugar (glucose) after you  have not eaten for a while (fasting). You may have this done every 1-3 years. Mammogram. This may be done every 1-2 years. Talk to your health care provider about when you should start having regular mammograms. This may depend on whether you have a family history of breast cancer. BRCA-related cancer screening. This may be done if you have a family history of breast, ovarian, tubal, or peritoneal cancers. Pelvic  exam and Pap test. This may be done every 3 years starting at age 32. Starting at age 61, this may be done every 5 years if you have a Pap test in combination with an HPV test. Bone density scan. This is done to screen for osteoporosis. You may have this scan if you are at high risk for osteoporosis. Discuss your test results, treatment options, and if necessary, the need for more tests with your health care provider. Vaccines  Your health care provider may recommend certain vaccines, such as: Influenza vaccine. This is recommended every year. Tetanus, diphtheria, and acellular pertussis (Tdap, Td) vaccine. You may need a Td booster every 10 years. Zoster vaccine. You may need this after age 4. Pneumococcal 13-valent conjugate (PCV13) vaccine. You may need this if you have certain conditions and were not previously vaccinated. Pneumococcal polysaccharide (PPSV23) vaccine. You may need one or two doses if you smoke cigarettes or if you have certain conditions. Talk to your health care provider about which screenings and vaccines you need and how often you need them. This information is not intended to replace advice given to you by your health care provider. Make sure you discuss any questions you have with your health care provider. Document Released: 07/13/2015 Document Revised: 03/05/2016 Document Reviewed: 04/17/2015 Elsevier Interactive Patient Education  2017 [place] Prevention in the Home Falls can cause injuries. They can happen to people of all ages. There are many things you can do to make your home safe and to help prevent falls. What can I do on the outside of my home? Regularly fix the edges of walkways and driveways and fix any cracks. Remove anything that might make you trip as you walk through a door, such as a raised step or threshold. Trim any bushes or trees on the path to your home. Use bright outdoor lighting. Clear any walking paths of anything that might  make someone trip, such as rocks or tools. Regularly check to see if handrails are loose or broken. Make sure that both sides of any steps have handrails. Any raised decks and porches should have guardrails on the edges. Have any leaves, snow, or ice cleared regularly. Use sand or salt on walking paths during winter. Clean up any spills in your garage right away. This includes oil or grease spills. What can I do in the bathroom? Use night lights. Install grab bars by the toilet and in the tub and shower. Do not use towel bars as grab bars. Use non-skid mats or decals in the tub or shower. If you need to sit down in the shower, use a plastic, non-slip stool. Keep the floor dry. Clean up any water that spills on the floor as soon as it happens. Remove soap buildup in the tub or shower regularly. Attach bath mats securely with double-sided non-slip rug tape. Do not have throw rugs and other things on the floor that can make you trip. What can I do in the bedroom? Use night lights. Make sure that you have a light by  your bed that is easy to reach. Do not use any sheets or blankets that are too big for your bed. They should not hang down onto the floor. Have a firm chair that has side arms. You can use this for support while you get dressed. Do not have throw rugs and other things on the floor that can make you trip. What can I do in the kitchen? Clean up any spills right away. Avoid walking on wet floors. Keep items that you use a lot in easy-to-reach places. If you need to reach something above you, use a strong step stool that has a grab bar. Keep electrical cords out of the way. Do not use floor polish or wax that makes floors slippery. If you must use wax, use non-skid floor wax. Do not have throw rugs and other things on the floor that can make you trip. What can I do with my stairs? Do not leave any items on the stairs. Make sure that there are handrails on both sides of the stairs  and use them. Fix handrails that are broken or loose. Make sure that handrails are as long as the stairways. Check any carpeting to make sure that it is firmly attached to the stairs. Fix any carpet that is loose or worn. Avoid having throw rugs at the top or bottom of the stairs. If you do have throw rugs, attach them to the floor with carpet tape. Make sure that you have a light switch at the top of the stairs and the bottom of the stairs. If you do not have them, ask someone to add them for you. What else can I do to help prevent falls? Wear shoes that: Do not have high heels. Have rubber bottoms. Are comfortable and fit you well. Are closed at the toe. Do not wear sandals. If you use a stepladder: Make sure that it is fully opened. Do not climb a closed stepladder. Make sure that both sides of the stepladder are locked into place. Ask someone to hold it for you, if possible. Clearly mark and make sure that you can see: Any grab bars or handrails. First and last steps. Where the edge of each step is. Use tools that help you move around (mobility aids) if they are needed. These include: Canes. Walkers. Scooters. Crutches. Turn on the lights when you go into a dark area. Replace any light bulbs as soon as they burn out. Set up your furniture so you have a clear path. Avoid moving your furniture around. If any of your floors are uneven, fix them. If there are any pets around you, be aware of where they are. Review your medicines with your doctor. Some medicines can make you feel dizzy. This can increase your chance of falling. Ask your doctor what other things that you can do to help prevent falls. This information is not intended to replace advice given to you by your health care provider. Make sure you discuss any questions you have with your health care provider. Document Released: 04/12/2009 Document Revised: 11/22/2015 Document Reviewed: 07/21/2014 Elsevier Interactive Patient  Education  2017 [employer].

## 2022-05-02 ENCOUNTER — Ambulatory Visit (INDEPENDENT_AMBULATORY_CARE_PROVIDER_SITE_OTHER): Payer: Medicare Other | Admitting: Internal Medicine

## 2022-05-02 ENCOUNTER — Encounter: Payer: Self-pay | Admitting: Internal Medicine

## 2022-05-02 VITALS — BP 128/68 | HR 73 | Ht 61.0 in | Wt 284.4 lb

## 2022-05-02 DIAGNOSIS — E039 Hypothyroidism, unspecified: Secondary | ICD-10-CM

## 2022-05-02 DIAGNOSIS — E282 Polycystic ovarian syndrome: Secondary | ICD-10-CM | POA: Diagnosis not present

## 2022-05-02 DIAGNOSIS — E1165 Type 2 diabetes mellitus with hyperglycemia: Secondary | ICD-10-CM

## 2022-05-02 DIAGNOSIS — Z23 Encounter for immunization: Secondary | ICD-10-CM

## 2022-05-02 LAB — POCT GLYCOSYLATED HEMOGLOBIN (HGB A1C): Hemoglobin A1C: 7 % — AB (ref 4.0–5.6)

## 2022-05-02 NOTE — Progress Notes (Signed)
Patient ID: Autumn Massey, female   DOB: [birth year], 56 y.o.   MRN: [id number]  HPI  Autumn Massey is a 56 y.o.-year-old female, returning for f/u for DM2, hypothyroidism, and PCOS. Last visit 4 months ago.  Interim history: She was referred to a plastic surgeon to remove her abdominal pannus.  She needs to have lower BMI before surgery, lower than 45.  Also, HbA1c has been lower than 8.  She was offered gastric bypass, but she refused. She [facility] with [facility] - high protein low carb high fiber diet.  She already started to gain muscle and lose weight and feels better.  She had to stop Ozempic as she was not able to eat the recommended food while on it. No increased urination, blurry vision, nausea, chest pain.  PCOS: Reviewed history:  - weight gain: Maximal weight 364  - was on Phentermine, which worked initially, then stopped working  - Would consider gastric bypass but only as a last resort - + hirsutism >> started to see facial hair in her 30s - + hair loss >> female pattern, and bald spot in vertex - No acne - had a Mirena IUD placed 04/2013 for heavy menstrual cycles >> out now; no menses since IUD out - has frequent Prednisone tapers    She was previously on spironolactone 50 mg twice daily but her potassium increased so we had to decrease the dose to 25 mg twice daily.  We ended up stopping spironolactone 11/2016.  However, she started to have more facial hair and more frontal balding so we restarted spironolactone 25 mg twice a day.  Her potassium level is normal now: Lab Results  Component Value Date   K 4.4 03/11/2022   Hypothyroidism: Pt. has been dx with hypothyroidism in 2005.  Pt is on levothyroxine (crushed) 225 mcg daily, taken: - in am - fasting - at least 30 min from b'fast - no Ca, Fe - + MVI 2h later! >> moved >4h later - no PPIs - not on Biotin  Reviewed her TFTs: Lab Results  Component Value Date   TSH 3.270 03/11/2022    TSH 2.55 07/10/2021   TSH 4.06 03/15/2021   TSH 21.97 (H) 12/06/2020   TSH 13.74 (H) 10/03/2020   TSH 42.400 (H) 09/19/2020   TSH 71.60 (H) 01/27/2020   TSH 0.80 10/20/2019   TSH 0.19 (L) 03/01/2019   TSH 0.16 (L) 01/26/2019   FREET4 1.40 03/11/2022   FREET4 1.02 03/15/2021   FREET4 0.70 12/06/2020   FREET4 0.90 10/03/2020   FREET4 0.68 (L) 09/19/2020   FREET4 1.17 10/20/2019   FREET4 1.23 03/01/2019   FREET4 1.21 07/26/2018   FREET4 0.97 04/30/2018   FREET4 1.32 03/02/2018    Pt denies: - feeling nodules in neck - hoarseness - dysphagia - choking  She was on Neurontin for tremors, but had to decrease the dose because of somnolence and then she was able to stop. She is on primidone >> restarted by Dr. Tomi Likens.  She was on Lyrica at night >> switched to Gabapentin.  DM2: Reviewed HbA1c levels: Lab Results  Component Value Date   HGBA1C 7.6 (A) 01/24/2022   HGBA1C 8.4 (A) 10/21/2021   HGBA1C 8.2 (H) 07/10/2021  04/26/2019: HbA1c calculated from fructosamine is 5.4% 03/09/2018: HbA1c calculated from fructosamine is better, at 5.5%! 09/02/2017: HbA1c calculated from Fructosamine is 6.1%. 05/05/2017: HbA1c calculated from Fructosamine is 6.00%. 03/10/2017: HbA1c calculated from Fructosamine is 6.17% (great!). 11/2016: HbA1c calculated  from the fructosamine is radically better, at 5.77%!  She is on: - Metformin 1000 mg with dinner >> 500 mg with dinner >> in am - Jardiance 10 mg in the pm -  started 02/2017 >> initially stopped b/c UTI and also had a tear on her labia >> resolved -currently on 25 mg daily -  >> off 4 weeks ago 2/2 decreased appetite Not taking Januvia >> not covered. She was previously on glipizide.  She checks sugars 1-2 times a day: - am: 120s >> 123, 141 >> 110-132 >> 114-138 >> 120-125 - after b'fast: 153 >> n/c - lunch: n/c >> 98-191 - after lunch: 145-150 >> 142 >> 131-152 >> n/c >> 123 - dinner: n/c >> 120-140, 157 >> n/c >> 94-114 - after dinner:  173-180 >> 149-173 >> 142-168 Highest CBG: 188 >> 230 (b'day cake + iceceam) >> 140s.  No CKD: Lab Results  Component Value Date   BUN 11 03/11/2022   Lab Results  Component Value Date   CREATININE 0.65 03/11/2022   + HL: Lab Results  Component Value Date   CHOL 130 03/11/2022   HDL 34 (L) 03/11/2022   LDLCALC 54 03/11/2022   LDLDIRECT 92.0 07/10/2021   TRIG 268 (H) 03/11/2022   CHOLHDL 5 07/10/2021  She is on Crestor 40 mg daily.  She had an eye exam - 03/2022: No DR reportedly.  She had a foot exam 06/2021.  She quit smoking 05/2015.  She had a D&C on 04/28/2019.  ROS: + See HPI + Tremors   I reviewed pt's medications, allergies, PMH, social hx, family hx, and changes were documented in the history of present illness. Otherwise, unchanged from my initial visit note.  She does not have a family history of medullary thyroid cancer or personal history of pancreatitis.  Past Medical History:  Diagnosis Date   Anxiety    ANXIETY DEPRESSION 01/29/2009   04/26/2019- no current   Arthritis    Asthma    CHF (congestive heart failure) ([place])    DEPRESSION 11/19/2007   Diabetes ([place])    Dyspnea    FREQUENCY, URINARY 10/17/2009   HOH (hard of hearing)    HYPERLIPIDEMIA 11/19/2007   HYPERTENSION 11/19/2007   HYPOTHYROIDISM 11/19/2007   Morbid obesity ([place])    Neuropathy 01/26/2019   Neuropathy    PNEUMONIA 08/06/2010   Pneumonia    POLYCYTHEMIA 01/29/2009   Preventative health care 04/06/2011   Sleep apnea    mild sleep apnea, on o2 at 2l at nighttime   [place] CCE 02/08/2009   Venous insufficiency 07/19/2012   Past Surgical History:  Procedure Laterality Date   ANTERIOR CERVICAL CORPECTOMY N/A 06/19/2021   Procedure: Cerivcal Six Corpectomy;  Surgeon: Ashok Pall, MD;  Location: [facility];  Service: Neurosurgery;  Laterality: N/A;   DILITATION & CURRETTAGE/HYSTROSCOPY WITH NOVASURE ABLATION N/A 04/28/2019   Procedure:  DILATATION & CURETTAGE/HYSTEROSCOPY WITH NOVASURE ABLATION;  Surgeon: Molli Posey, MD;  Location: [facility];  Service: Gynecology;  Laterality: N/A;  [employer] rep will be here confirmed on 04/20/19   TONSILLECTOMY     ULNAR TUNNEL RELEASE Left 02/22/2013   Procedure: LEFT ULNAR NERVE DECOMPRESSION ;  Surgeon: Tennis Must, MD;  Location: [facility];  Service: Orthopedics;  Laterality: Left;   Social History   Socioeconomic History   Marital status: Divorced    Spouse name: Not on file   Number of children: 0   Years of education: 12   Highest  education level: 12th grade  Occupational History   Occupation: Disabled    Employer: [employer]  Tobacco Use   Smoking status: Every Day    Packs/day: 0.50    Years: 22.00    Total pack years: 11.00    Types: Cigarettes   Smokeless tobacco: Never  Vaping Use   Vaping Use: Never used  Substance and Sexual Activity   Alcohol use: Not Currently    Comment: rare   Drug use: No   Sexual activity: Never  Other Topics Concern   Not on file  Social History Narrative   Patient is right-handed.   Social Determinants of Health   Financial Resource Strain: Low Risk  (04/30/2022)   Overall Financial Resource Strain (CARDIA)    Difficulty of Paying Living Expenses: Not hard at all  Food Insecurity: No Food Insecurity (04/30/2022)   Hunger Vital Sign    Worried About Running Out of Food in the Last Year: Never true    Ran Out of Food in the Last Year: Never true  Transportation Needs: No Transportation Needs (04/30/2022)   PRAPARE - Hydrologist (Medical): No    Lack of Transportation (Non-Medical): No  Physical Activity: Sufficiently Active (04/30/2022)   Exercise Vital Sign    Days of Exercise per Week: 7 days    Minutes of Exercise per Session: 30 min  Stress: No Stress Concern Present (04/30/2022)   [place]    Feeling of  Stress : Not at all  Social Connections: [place] (04/30/2022)   Social Connection and Isolation Panel [NHANES]    Frequency of Communication with Friends and Family: More than three times a week    Frequency of Social Gatherings with Friends and Family: More than three times a week    Attends Religious Services: More than 4 times per year    Active Member of [employer] or Organizations: Yes    Attends Music therapist: More than 4 times per year    Marital Status: Married  Human resources officer Violence: Not At Risk (04/30/2022)   Humiliation, Afraid, Rape, and Kick questionnaire    Fear of Current or Ex-Partner: No    Emotionally Abused: No    Physically Abused: No    Sexually Abused: No   Current Outpatient Medications on File Prior to Visit  Medication Sig Dispense Refill   Accu-Chek Softclix Lancets lancets Use as instructed to check blood sugar once daily 100 each 3   albuterol (PROVENTIL) (2.5 MG/3ML) 0.083% nebulizer solution Take 3 mLs (2.5 mg total) by nebulization every 6 (six) hours as needed for wheezing or shortness of breath. 75 mL 3   albuterol (VENTOLIN HFA) 108 (90 Base) MCG/ACT inhaler USE 2 INHALATIONS BY MOUTH  EVERY 6 HOURS AS NEEDED 34 g 3   aspirin EC 81 MG tablet Take 81 mg by mouth daily.     bisoprolol-hydrochlorothiazide (ZIAC) 5-6.25 MG tablet TAKE 1 TABLET BY MOUTH  DAILY 90 tablet 3   Blood Glucose Monitoring Suppl (ACCU-CHEK GUIDE) w/Device KIT Use as instructed to check blood sugar 2X daily 1 kit 0   BREO ELLIPTA 100-25 MCG/ACT AEPB INHALE 1 INHALATION BY  MOUTH INTO THE LUNGS IN THE MORNING 180 each 3   buPROPion (WELLBUTRIN XL) 150 MG 24 hr tablet Take 1 tablet (150 mg total) by mouth daily. 90 tablet 3   clonazePAM (KLONOPIN) 0.5 MG tablet Take 1 tablet (0.5 mg total)  by mouth 2 (two) times daily as needed for anxiety. 60 tablet 5   cyclobenzaprine (FLEXERIL) 5 MG tablet TAKE 1 TABLET BY MOUTH 3  TIMES DAILY AS NEEDED FOR  MUSCLE SPASM(S) 270  tablet 1   Diclofenac Sodium (PENNSAID) 2 % SOLN Place 2 g onto the skin 2 (two) times daily. 112 g 3   empagliflozin (JARDIANCE) 25 MG TABS tablet Take 1 tablet (25 mg total) by mouth daily before breakfast. 90 tablet 3   fluconazole (DIFLUCAN) 150 MG tablet 1 tab by mouth every 3 days as needed 2 tablet 1   FLUoxetine (PROZAC) 40 MG capsule TAKE 1 CAPSULE BY MOUTH  DAILY 90 capsule 3   furosemide (LASIX) 40 MG tablet TAKE 1 TABLET BY MOUTH DAILY 90 tablet 1   Gabapentin Enacarbil (HORIZANT) 600 MG TBCR Take 1 tablet (600 mg total) by mouth at bedtime. 90 tablet 3   glucose blood (ACCU-CHEK GUIDE) test strip Use as instructed 100 each 12   HYDROcodone-acetaminophen (NORCO/VICODIN) 5-325 MG tablet Take 1 tablet by mouth every 6 (six) hours as needed for moderate pain.     irbesartan (AVAPRO) 75 MG tablet TAKE 1 TABLET BY MOUTH AT  BEDTIME 90 tablet 3   Lancets Misc. (ACCU-CHEK FASTCLIX LANCET) KIT Use to check blood sugar once a day 1 kit 0   levothyroxine (SYNTHROID) 200 MCG tablet TAKE 1 TABLET BY MOUTH DAILY 100 tablet 2   levothyroxine (SYNTHROID) 25 MCG tablet Take 1 tablet (25 mcg total) by mouth daily before breakfast. Take with 200 mcg to equal 225 mcg daily 90 tablet 3   metFORMIN (GLUCOPHAGE-XR) 500 MG 24 hr tablet TAKE 1 TABLET BY MOUTH  DAILY WITH BREAKFAST 90 tablet 3   montelukast (SINGULAIR) 10 MG tablet TAKE 1 TABLET BY MOUTH AT  BEDTIME 90 tablet 3   Multiple Vitamin (MULTIVITAMIN WITH MINERALS) TABS tablet Take 1 tablet by mouth daily.     nystatin cream (MYCOSTATIN) Apply 1 application. topically 2 (two) times daily. 30 g 2   ondansetron (ZOFRAN ODT) 4 MG disintegrating tablet Take 1 tablet (4 mg total) by mouth every 8 (eight) hours as needed for nausea or vomiting. 4 tablet 0   oxyCODONE (OXY IR/ROXICODONE) 5 MG immediate release tablet Take 5 mg by mouth 4 (four) times daily as needed.     OXYGEN 2lpm with sleep only  [facility]     primidone (MYSOLINE) 50 MG tablet TAKE 1 TABLET  BY MOUTH IN THE  MORNING AND 3 TABLETS BY MOUTH  AT BEDTIME 360 tablet 0   rosuvastatin (CRESTOR) 40 MG tablet Take 1 tablet (40 mg total) by mouth daily. 90 tablet 3   Semaglutide,0.25 or 0.5MG/DOS, (OZEMPIC, 0.25 OR 0.5 MG/DOSE,) 2 MG/1.5ML SOPN Inject 0.5 mg into the skin once a week. 4.5 mL 3   spironolactone (ALDACTONE) 25 MG tablet TAKE 1 TABLET BY MOUTH TWICE  DAILY 200 tablet 1   Vitamin D, Ergocalciferol, (DRISDOL) 1.25 MG (50000 UNIT) CAPS capsule Take 1 capsule (50,000 Units total) by mouth every 7 (seven) days. 4 capsule 0   No current facility-administered medications on file prior to visit.   Allergies  Allergen Reactions   Cymbalta [Duloxetine Hcl]     Altered mental state / agitated    Family History  Problem Relation Age of Onset   Cancer Mother        melanoma    Heart disease Mother    High blood pressure Mother    Stroke Mother  Thyroid disease Mother    Anxiety disorder Mother    High Cholesterol Sister    Psoriasis Brother    Osteoarthritis Maternal Grandmother 46   Scoliosis Maternal Grandmother    Cancer Maternal Grandfather        prostate cancer   Alcohol abuse Other    Arthritis Other    Hypertension Other    PE: BP 128/68 (BP Location: Left Arm, Patient Position: Sitting, Cuff Size: Normal)   Pulse 73   Ht _0  (1.549 m)   Wt 284 lb 6.4 oz (129 kg)   SpO2 92%   BMI 53.74 kg/m  Wt Readings from Last 3 Encounters:  05/02/22 284 lb 6.4 oz (129 kg)  04/30/22 278 lb (126.1 kg)  04/23/22 278 lb (126.1 kg)   Constitutional: overweight, in NAD Eyes: no exophthalmos ENT: no thyromegaly, no cervical lymphadenopathy Cardiovascular: RRR, No MRG Respiratory: CTA B Musculoskeletal: no deformities Skin:  + stasis dermatitis rashes on lower legs R>L Neurological: no tremor with outstretched hands  PLAN:  1. Patient with longstanding hypothyroidism -Previously uncontrolled -Last year, she had very high TSH levels, 71 and 42 respectively, despite  a high dose of levothyroxine.  I was not aware of these results, but I became aware of her TSH from 09/2020 which improved to 13.7.  Afterwards, we discussed about the need for compliance and also advised her to crush the levothyroxine tablet, after which her TFTs normalized. - latest thyroid labs reviewed with pt. >> normal: Lab Results  Component Value Date   TSH 3.270 03/11/2022  - she continues on LT4 225 mcg daily - pt feels good on this dose. - we discussed about taking the thyroid hormone every day, with water, >30 minutes before breakfast, separated by >4 hours from acid reflux medications, calcium, iron, multivitamins. Pt. is taking it correctly.  2. PCOS -She aged out of her PCOS diagnosis -However, she continues to have increased facial hair and alopecia she continues on spironolactone 25 mg twice a day -Latest potassium level was normal Lab Results  Component Value Date   K 4.4 03/11/2022  -We cannot increase the dose further due to previous hyperkalemia with higher doses  3. DM2 -Currently seen in the weight management clinic -now on a high-protein diet.  She had to come off Ozempic to be able to eat all the recommended food.  She continues on her metformin and Jardiance. -Her sugars at home are mostly at goal.  For now, we can continue without Ozempic. - I suggested: Patient Instructions  Please continue: - Metformin ER 500 mg with breakfast - Jardiance 25 mg before breakfast   Please continue the crushed levothyroxine 225 g daily.   Take the thyroid hormone every day, with water, at least 30 minutes before breakfast, separated by at least 4 hours from: - acid reflux medications - calcium - iron - multivitamins   Continue spironolactone 25 mg twice a day.   Please return in 6 months.  - we checked her HbA1c: 7.0% (lower) - advised to check sugars at different times of the day - 1x a day, rotating check times - advised for yearly eye exams >> she is UTD -  return to clinic in 6 month  4.  Obesity class III -She continues on Jardiance but is off Ozempic due to decreased appetite -However, she is losing weight now on a high-protein low-carb diet. -In the past, she was doing a partially keto diet with intermittent fasting 16-8 -She gained a  significant amount of weight during her COVID infection at the beginning of 2022 -She also started to exercise: Hand weights and chair exercises.  After her pannus surgery, she will likely be able to also walk -she lost 12 lbs since last OV   + flu shot today  Philemon Kingdom, MD PhD [facility] Endocrinology

## 2022-05-02 NOTE — Patient Instructions (Signed)
Please continue: - Metformin ER 500 mg with breakfast - Jardiance 25 mg before breakfast  Please continue the crushed levothyroxine 225 g daily.   Take the thyroid hormone every day, with water, at least 30 minutes before breakfast, separated by at least 4 hours from: - acid reflux medications - calcium - iron - multivitamins   Continue spironolactone 25 mg twice a day.   Please return in 6 months.

## 2022-05-06 NOTE — Progress Notes (Signed)
Chief Complaint:   OBESITY Autumn Massey is here to discuss her progress with her obesity treatment plan along with follow-up of her obesity related diagnoses. Autumn Massey is on the Category 3 Plan with breakfast and lunch options with 200 snack calories and states she is following her eating plan approximately 90-95% of the time. Autumn Massey states she is doing hand weights and leg lifts for 15-20 minutes 3 times per week.  Today's visit was #: 4 Starting weight: 288 lbs Starting date: 03/11/2022 Today's weight: 278 lbs Today's date: 04/23/2022 Total lbs lost to date: 10 Total lbs lost since last in-office visit: 5  Interim History: Patient is really focused on increasing her protein intake. She denies hunger or cravings. She has no issues with her meal plan.   Subjective:   1. Type 2 diabetes mellitus with obesity (HCC) Patient's fasting blood sugars range at 112, 122, 119, and 124 average over a day. Her lowest is 94 and her highest 131. Her last A1c was 7.6 three months ago. She sees Dr. Cruzita Lederer of Endocrinology for management.   2. Vitamin D deficiency Autumn Massey is tolerating medication(s) well without side effects.  Medication compliance is good as patient endorses taking it as prescribed.  The patient denies additional concerns regarding this condition. Her last Vitamin D level was 39.5 on 03/11/2022.      Assessment/Plan:  No orders of the defined types were placed in this encounter.   Medications Discontinued During This Encounter  Medication Reason   Vitamin D, Ergocalciferol, (DRISDOL) 1.25 MG (50000 UNIT) CAPS capsule Reorder     Meds ordered this encounter  Medications   Vitamin D, Ergocalciferol, (DRISDOL) 1.25 MG (50000 UNIT) CAPS capsule    Sig: Take 1 capsule (50,000 Units total) by mouth every 7 (seven) days.    Dispense:  4 capsule    Refill:  0     1. Type 2 diabetes mellitus with obesity (HCC) Patient's A1c is not at goal. Reviewed with the patient. We will need  to recheck her labs in the near future with [name] or with Dr. Cruzita Lederer. She will continue her medications per Dr. Cruzita Lederer. I reminded the patient of the importance of following her prudent nutritional plan and weight loss for control of her medical condition.   2. Vitamin D deficiency We will refill prescription Vitamin D 50,000 IU every week for 1 month, and she will follow-up for routine testing of Vitamin D, at least 2-3 times per year to avoid over-replacement.  - Vitamin D, Ergocalciferol, (DRISDOL) 1.25 MG (50000 UNIT) CAPS capsule; Take 1 capsule (50,000 Units total) by mouth every 7 (seven) days.  Dispense: 4 capsule; Refill: 0  3. Obesity BMI 52.6 Autumn Massey is currently in the action stage of change. As such, her goal is to continue with weight loss efforts. She has agreed to the Category 3 Plan with breakfast and lunch options with 200 snack calories.   Exercise goals: As is, increase as tolerated.   Behavioral modification strategies: increasing lean protein intake, decreasing simple carbohydrates, and planning for success.  Autumn Massey has agreed to follow-up with our clinic in 3 to 4 weeks. She was informed of the importance of frequent follow-up visits to maximize her success with intensive lifestyle modifications for her multiple health conditions.   Objective:   Blood pressure 107/73, pulse 75, temperature 98.2 F (36.8 C), height '5\' 1"'$  (1.549 m), weight 278 lb (126.1 kg), SpO2 93 %. Body mass index is 52.53 kg/m.  General: Cooperative, alert, well developed, in no acute distress. HEENT: Conjunctivae and lids unremarkable. Cardiovascular: Regular rhythm.  Lungs: Normal work of breathing. Neurologic: No focal deficits.   Lab Results  Component Value Date   CREATININE 0.65 03/11/2022   BUN 11 03/11/2022   NA 137 03/11/2022   K 4.4 03/11/2022   CL 91 (L) 03/11/2022   CO2 25 03/11/2022   Lab Results  Component Value Date   ALT 16 03/11/2022   AST 16 03/11/2022   ALKPHOS 86  03/11/2022   BILITOT 0.7 03/11/2022   Lab Results  Component Value Date   HGBA1C 7.0 (A) 05/02/2022   HGBA1C 7.6 (A) 01/24/2022   HGBA1C 8.4 (A) 10/21/2021   HGBA1C 8.2 (H) 07/10/2021   HGBA1C 8.4 (H) 06/17/2021   Lab Results  Component Value Date   INSULIN 7.5 03/11/2022   Lab Results  Component Value Date   TSH 3.270 03/11/2022   Lab Results  Component Value Date   CHOL 130 03/11/2022   HDL 34 (L) 03/11/2022   LDLCALC 54 03/11/2022   LDLDIRECT 92.0 07/10/2021   TRIG 268 (H) 03/11/2022   CHOLHDL 5 07/10/2021   Lab Results  Component Value Date   VD25OH 39.5 03/11/2022   VD25OH 35.27 07/10/2021   VD25OH 29.64 (L) 10/03/2020   Lab Results  Component Value Date   WBC 13.1 (H) 03/11/2022   HGB 19.6 (H) 03/11/2022   HCT 57.0 (H) 03/11/2022   MCV 92 03/11/2022   PLT 167 03/11/2022   Lab Results  Component Value Date   IRON 93 01/26/2019   FERRITIN 124.5 01/29/2009   Attestation Statements:   Reviewed by clinician on day of visit: allergies, medications, problem list, medical history, surgical history, family history, social history, and previous encounter notes.   Wilhemena Durie, am acting as transcriptionist for [employer], DO.   I have reviewed the above documentation for accuracy and completeness, and I agree with the above. Marjory Sneddon, D.O.  The [place] was signed into law in 2016 which includes the topic of electronic health records.  This provides immediate access to information in MyChart.  This includes consultation notes, operative notes, office notes, lab results and pathology reports.  If you have any questions about what you read please let us know at your next visit so we can discuss your concerns and take corrective action if need be.  We are right here with you.

## 2022-05-13 ENCOUNTER — Encounter: Payer: Self-pay | Admitting: Plastic Surgery

## 2022-05-13 ENCOUNTER — Encounter (INDEPENDENT_AMBULATORY_CARE_PROVIDER_SITE_OTHER): Payer: Self-pay | Admitting: Family Medicine

## 2022-05-13 ENCOUNTER — Ambulatory Visit (INDEPENDENT_AMBULATORY_CARE_PROVIDER_SITE_OTHER): Payer: Medicare Other | Admitting: Family Medicine

## 2022-05-13 ENCOUNTER — Ambulatory Visit (INDEPENDENT_AMBULATORY_CARE_PROVIDER_SITE_OTHER): Payer: Medicare Other | Admitting: Plastic Surgery

## 2022-05-13 VITALS — BP 118/72 | HR 82 | Temp 98.9°F | Ht 61.0 in | Wt 278.6 lb

## 2022-05-13 DIAGNOSIS — Z7984 Long term (current) use of oral hypoglycemic drugs: Secondary | ICD-10-CM

## 2022-05-13 DIAGNOSIS — I152 Hypertension secondary to endocrine disorders: Secondary | ICD-10-CM

## 2022-05-13 DIAGNOSIS — E669 Obesity, unspecified: Secondary | ICD-10-CM

## 2022-05-13 DIAGNOSIS — E1169 Type 2 diabetes mellitus with other specified complication: Secondary | ICD-10-CM | POA: Diagnosis not present

## 2022-05-13 DIAGNOSIS — M793 Panniculitis, unspecified: Secondary | ICD-10-CM

## 2022-05-13 DIAGNOSIS — E1159 Type 2 diabetes mellitus with other circulatory complications: Secondary | ICD-10-CM

## 2022-05-13 DIAGNOSIS — Z6841 Body Mass Index (BMI) 40.0 and over, adult: Secondary | ICD-10-CM

## 2022-05-13 NOTE — Progress Notes (Signed)
   Subjective:    Patient ID: Autumn Massey, female    DOB: [birth year], 56 y.o.   MRN: [id number]  The patient is a 56 year old female joining me by phone for further discussion about her panniculitis.  She is 5 feet 2 inches tall and weighed 294 pounds in January.  Her BMI was 53.8 kg/m.  She had decreased her weight from 367 pounds over the last 2 years.  During the discussion she stated she was smoking.  She is also had spine surgery by Dr. Christella Noa and her past medical history included chronic obstructive pulmonary disease, depression, chest pain, radiculopathy, neuropathy, hypertension and diabetes   Review of Systems     Objective:   Physical Exam        Assessment & Plan:     ICD-10-CM   1. Panniculitis  M79.3        Continue weight reduction and follow up when ready.

## 2022-05-20 ENCOUNTER — Other Ambulatory Visit: Payer: Self-pay | Admitting: Neurology

## 2022-05-20 ENCOUNTER — Other Ambulatory Visit: Payer: Self-pay | Admitting: Internal Medicine

## 2022-05-20 DIAGNOSIS — J449 Chronic obstructive pulmonary disease, unspecified: Secondary | ICD-10-CM | POA: Diagnosis not present

## 2022-05-20 MED ORDER — CLONAZEPAM 0.5 MG PO TABS: 0.5000 mg | ORAL_TABLET | Freq: Two times a day (BID) | ORAL | 5 refills | Status: DC | PRN

## 2022-05-24 NOTE — Progress Notes (Signed)
Chief Complaint:   OBESITY Autumn Massey is here to discuss her progress with her obesity treatment plan along with follow-up of her obesity related diagnoses. [name] is on the Category 3 Plan+ breakfast and lunch options and states she is following her eating plan approximately 90% of the time. [name] states she is not exercising.   Today's visit was #: 5 Starting weight: 288 lbs Starting date: 03/11/2022 Today's weight: 278 lbs Today's date: 05/13/2022 Total lbs lost to date: 10 lbs Total lbs lost since last in-office visit: 0  Interim History: " feel great mentally and physically.  I work 3 days per week now at [employer]."  She is still struggling with all of her protein intake.  (She works 6 AM-2 PM).   Subjective:   1. Type 2 diabetes mellitus with obesity (HCC) Her recent A1c was 7.0 with Dr Cruzita Lederer.  Has not taken her Ozempic since 03/25/2022.  She still cannot eat her proteins.  She is taking Jardiance and metformin.   2. Hypertension associated with type 2 diabetes mellitus ([place]) She is taking lasix and Avapro.   Assessment/Plan:  No orders of the defined types were placed in this encounter.   There are no discontinued medications.   No orders of the defined types were placed in this encounter.    1. Type 2 diabetes mellitus with obesity ([place]) Continue medications, increase activity, PNP, and weight loss.  Hypoglycemia prevention discussed with patient.   2. Hypertension associated with type 2 diabetes mellitus ([place]) Blood pressure is at goal.  Decrease salt intake, increase water intake.   3. Obesity BMI 52.6 Decreasing eating out, do not skip meals or proteins.  Handouts given today.   [name] is currently in the action stage of change. As such, her goal is to continue with weight loss efforts. She has agreed to the Category 3 Plan+ breakfast and lunch options.   Exercise goals:  As is.   Behavioral modification strategies: increasing lean protein intake, no  skipping meals, and holiday eating strategies .  [name] has agreed to follow-up with our clinic in 2-3 weeks. She was informed of the importance of frequent follow-up visits to maximize her success with intensive lifestyle modifications for her multiple health conditions.   Objective:   Blood pressure 118/72, pulse 82, temperature 98.9 F (37.2 C), height '5\' 1"'$  (1.549 m), weight 278 lb 9.6 oz (126.4 kg), SpO2 (!) 88 %. Body mass index is 52.64 kg/m.  General: Cooperative, alert, well developed, in no acute distress. HEENT: Conjunctivae and lids unremarkable. Cardiovascular: Regular rhythm.  Lungs: Normal work of breathing. Neurologic: No focal deficits.   Lab Results  Component Value Date   CREATININE 0.65 03/11/2022   BUN 11 03/11/2022   NA 137 03/11/2022   K 4.4 03/11/2022   CL 91 (L) 03/11/2022   CO2 25 03/11/2022   Lab Results  Component Value Date   ALT 16 03/11/2022   AST 16 03/11/2022   ALKPHOS 86 03/11/2022   BILITOT 0.7 03/11/2022   Lab Results  Component Value Date   HGBA1C 7.0 (A) 05/02/2022   HGBA1C 7.6 (A) 01/24/2022   HGBA1C 8.4 (A) 10/21/2021   HGBA1C 8.2 (H) 07/10/2021   HGBA1C 8.4 (H) 06/17/2021   Lab Results  Component Value Date   INSULIN 7.5 03/11/2022   Lab Results  Component Value Date   TSH 3.270 03/11/2022   Lab Results  Component Value Date   CHOL 130 03/11/2022   HDL 34 (  L) 03/11/2022   LDLCALC 54 03/11/2022   LDLDIRECT 92.0 07/10/2021   TRIG 268 (H) 03/11/2022   CHOLHDL 5 07/10/2021   Lab Results  Component Value Date   VD25OH 39.5 03/11/2022   VD25OH 35.27 07/10/2021   VD25OH 29.64 (L) 10/03/2020   Lab Results  Component Value Date   WBC 13.1 (H) 03/11/2022   HGB 19.6 (H) 03/11/2022   HCT 57.0 (H) 03/11/2022   MCV 92 03/11/2022   PLT 167 03/11/2022   Lab Results  Component Value Date   IRON 93 01/26/2019   FERRITIN 124.5 01/29/2009   Attestation Statements:   Reviewed by clinician on day of visit: allergies,  medications, problem list, medical history, surgical history, family history, social history, and previous encounter notes.  Time spent on visit including pre-visit chart review and post-visit care and charting was 30 minutes.   I, Davy Pique, RMA, am acting as Location manager for [employer], DO.   I have reviewed the above documentation for accuracy and completeness, and I agree with the above. Marjory Sneddon, D.O.  The [place] was signed into law in 2016 which includes the topic of electronic health records.  This provides immediate access to information in MyChart.  This includes consultation notes, operative notes, office notes, lab results and pathology reports.  If you have any questions about what you read please let us know at your next visit so we can discuss your concerns and take corrective action if need be.  We are right here with you.

## 2022-05-28 ENCOUNTER — Ambulatory Visit (INDEPENDENT_AMBULATORY_CARE_PROVIDER_SITE_OTHER): Payer: Medicare Other | Admitting: Family Medicine

## 2022-05-28 ENCOUNTER — Encounter (INDEPENDENT_AMBULATORY_CARE_PROVIDER_SITE_OTHER): Payer: Self-pay | Admitting: Internal Medicine

## 2022-05-28 ENCOUNTER — Ambulatory Visit (INDEPENDENT_AMBULATORY_CARE_PROVIDER_SITE_OTHER): Payer: Medicare Other | Admitting: Internal Medicine

## 2022-05-28 VITALS — BP 116/74 | HR 77 | Temp 98.1°F | Ht 61.0 in | Wt 275.0 lb

## 2022-05-28 DIAGNOSIS — Z7984 Long term (current) use of oral hypoglycemic drugs: Secondary | ICD-10-CM

## 2022-05-28 DIAGNOSIS — Z6841 Body Mass Index (BMI) 40.0 and over, adult: Secondary | ICD-10-CM | POA: Diagnosis not present

## 2022-05-28 DIAGNOSIS — I10 Essential (primary) hypertension: Secondary | ICD-10-CM | POA: Diagnosis not present

## 2022-05-28 DIAGNOSIS — E669 Obesity, unspecified: Secondary | ICD-10-CM

## 2022-05-28 DIAGNOSIS — E1169 Type 2 diabetes mellitus with other specified complication: Secondary | ICD-10-CM

## 2022-06-03 ENCOUNTER — Telehealth: Payer: Self-pay | Admitting: Internal Medicine

## 2022-06-03 DIAGNOSIS — E1165 Type 2 diabetes mellitus with hyperglycemia: Secondary | ICD-10-CM

## 2022-06-03 MED ORDER — ACCU-CHEK GUIDE VI STRP: ORAL_STRIP | 3 refills | Status: DC

## 2022-06-03 NOTE — Telephone Encounter (Signed)
New message     1. Which medications need to be refilled? (please list name of each medication and dose if known) glucose blood (ACCU-CHEK GUIDE) test strip   2. Which pharmacy/location (including street and city if local pharmacy) is medication to be sent to? [facility] mail order   3. Do they need a 30 day or 90 day supply? 90 day supply

## 2022-06-03 NOTE — Telephone Encounter (Signed)
Rx sent 

## 2022-06-08 IMAGING — MR MR CERVICAL SPINE W/O CM
7 series · 35 of 48 positions shown · non-contrast
Comparison: Previous MRI from 06/05/2013.

CLINICAL DATA: Initial evaluation for cervical radiculopathy, pain
at back of neck with extension down both arms and hands, numbness
and tingling.

EXAM:
MRI CERVICAL SPINE WITHOUT CONTRAST
TECHNIQUE: Multiplanar, multisequence MR imaging of the cervical spine was
performed. No intravenous contrast was administered.

[Series 5: T2 · sagittal · 3.0mm · 0.55mm/px · 4 of 15 slices shown (1 of 3)]
[im 1/15]
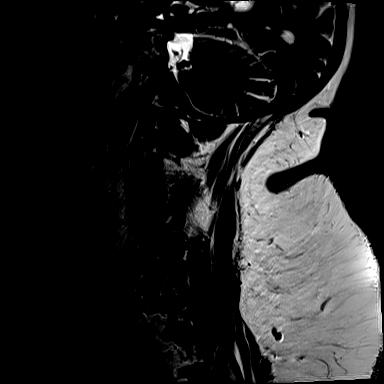
[im 5/15]
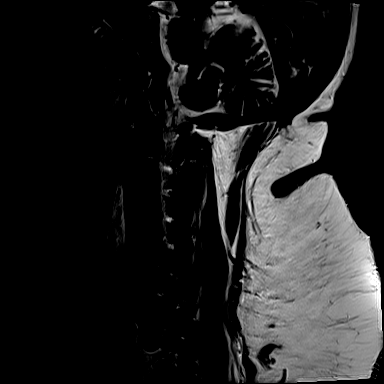
[im 10/15]
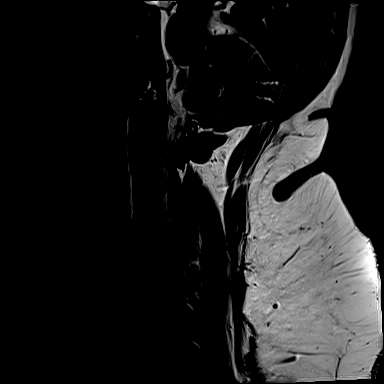
[im 15/15]
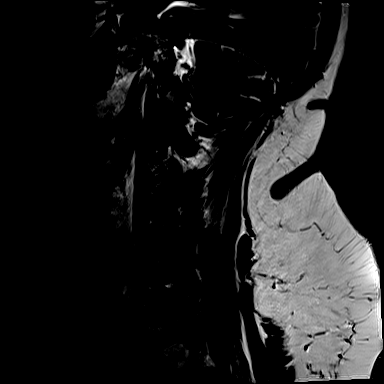

[Series 6: STIR · sagittal · 3.0mm · 0.33mm/px · 4 of 15 slices shown]
[im 1/15]
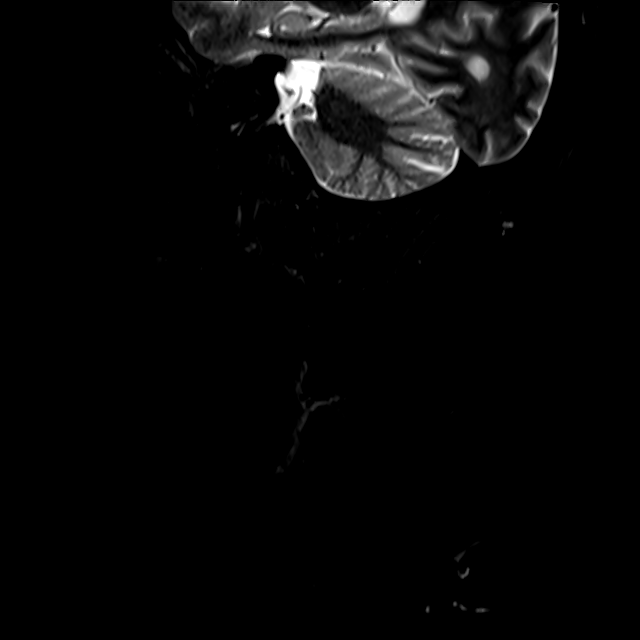
[im 5/15]
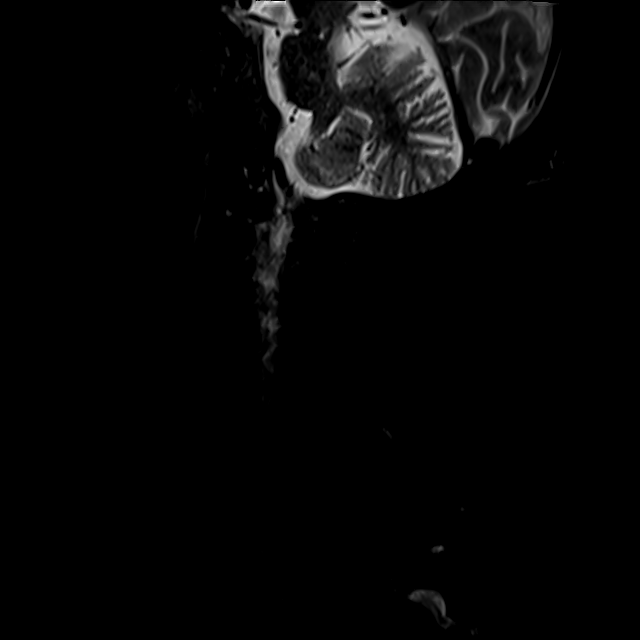
[im 10/15]
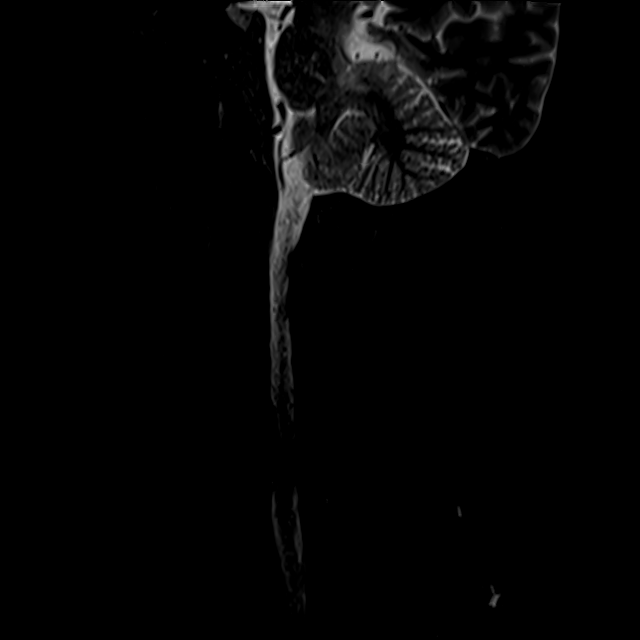
[im 15/15]
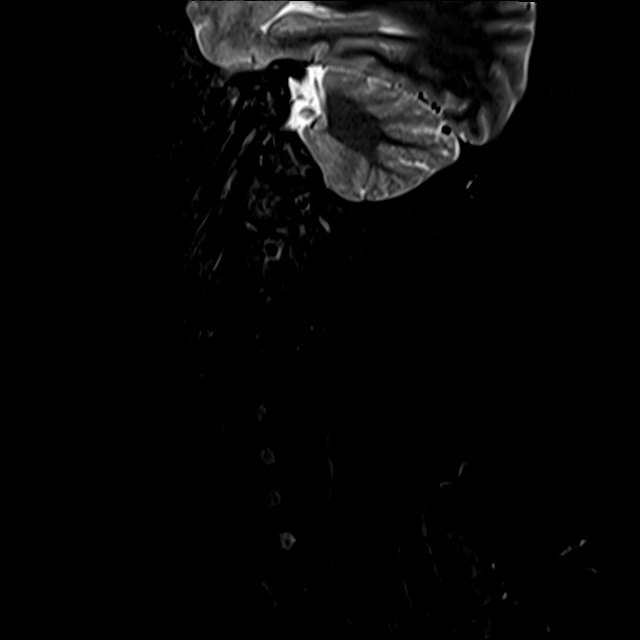

[Series 7: T1 · sagittal · 3.0mm · 0.66mm/px · 5 of 15 slices shown (1 of 2)]
[im 1/15]
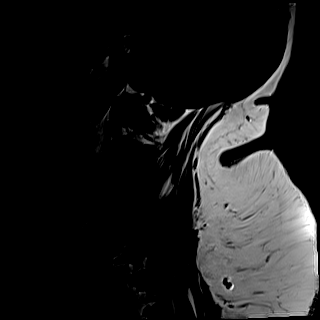
[im 4/15]
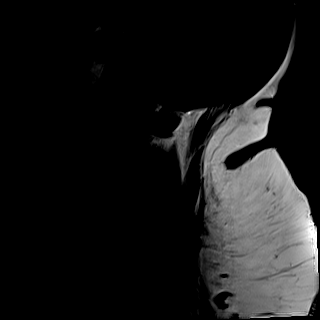
[im 8/15]
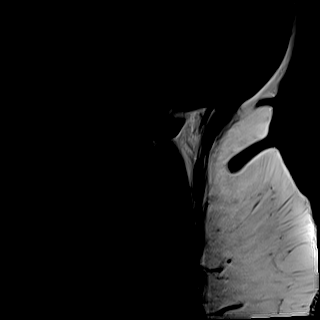
[im 11/15]
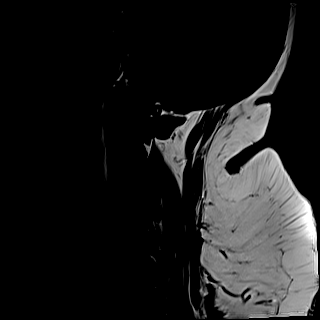
[im 15/15]
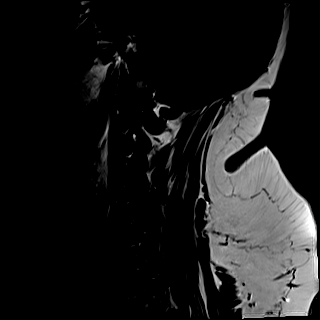

[Series 8: T2 · axial · 3.0mm · 0.50mm/px · z∈[-53,+47]mm · 8 of 32 slices shown (2 of 3)]
[im 1/32]
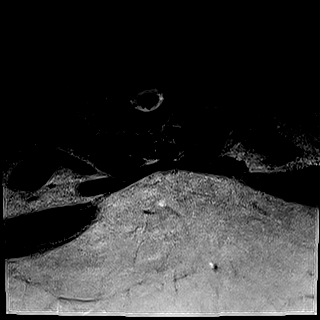
[im 4/32]
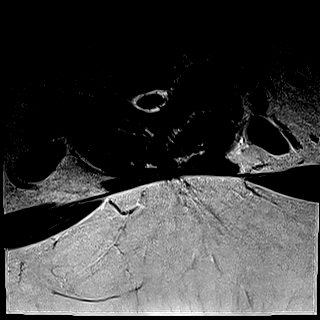
[im 11/32]
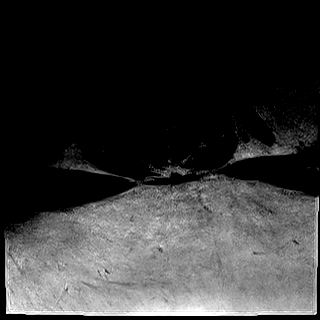
[im 14/32]
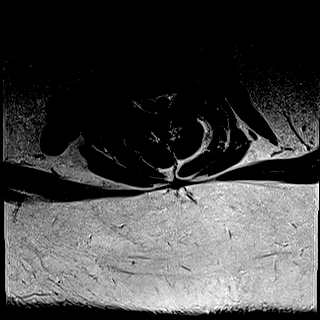
[im 18/32]
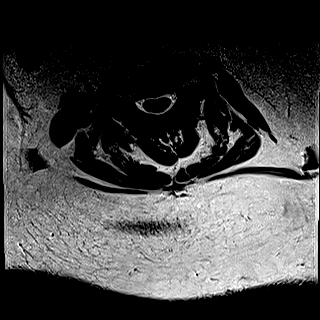
[im 21/32]
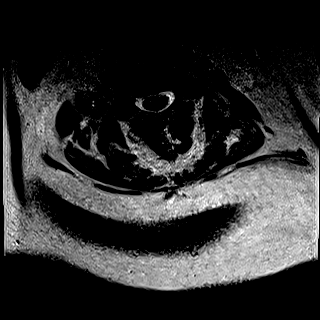
[im 28/32]
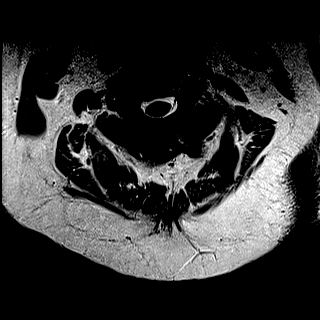
[im 32/32]
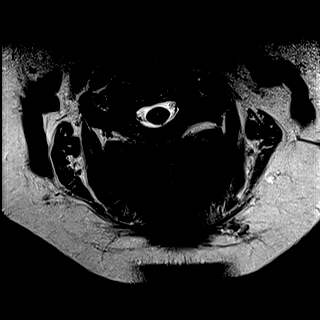

[Series 9: GRE · axial · 3.0mm · 0.42mm/px · 1 of 32 slices shown]
[im 1/32]
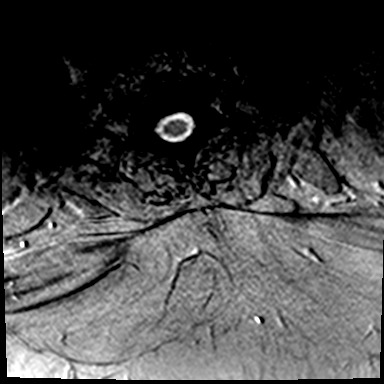

[Series 10: T1 · sagittal · 3.0mm · 0.66mm/px · 5 of 15 slices shown (2 of 2)]
[im 1/15]
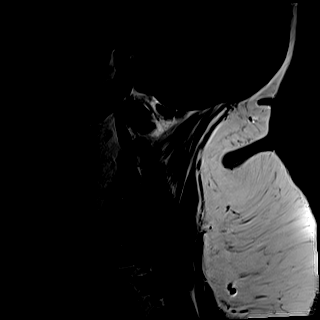
[im 4/15]
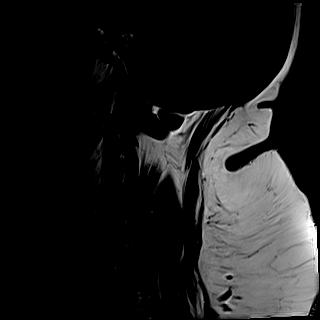
[im 8/15]
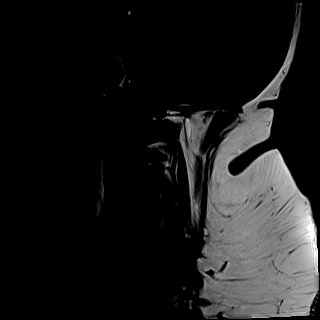
[im 11/15]
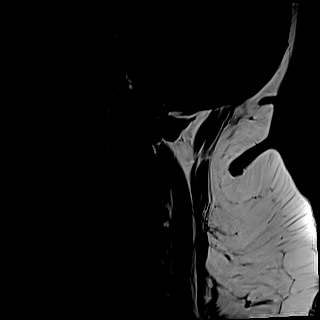
[im 15/15]
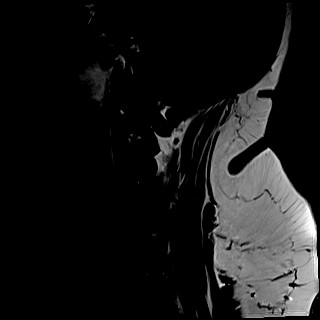

[Series 11: T2 · axial · 3.0mm · 0.78mm/px · z∈[-56,+44]mm · 8 of 32 slices shown (3 of 3)]
[im 1/32]
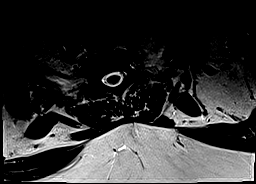
[im 4/32]
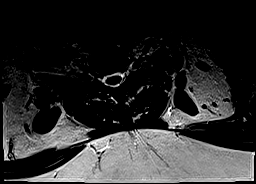
[im 11/32]
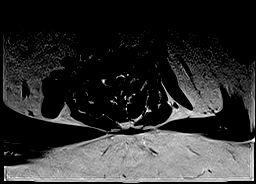
[im 14/32]
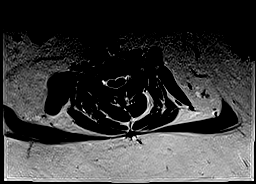
[im 18/32]
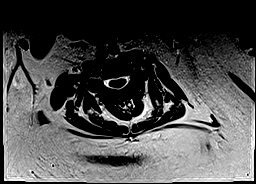
[im 21/32]
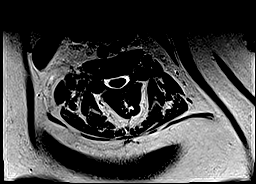
[im 28/32]
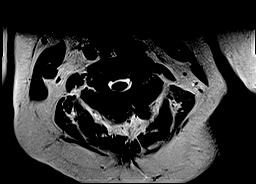
[im 32/32]
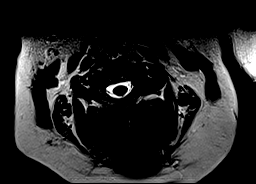

[35 of 48 positions shown; findings below may reference images not displayed]

FINDINGS: Alignment: Examination technically limited by habitus and motion
artifact.

Straightening with slight reversal of the normal cervical lordosis.
No significant listhesis.

Vertebrae: Vertebral body height maintained without acute or chronic
fracture. Diffusely decreased T1 signal intensity seen throughout
the visualized bone marrow, nonspecific, but most commonly related
to anemia, smoking, or obesity. No discrete or worrisome osseous
lesions. Mild discogenic reactive endplate change present about the
C5-6 and C6-7 interspaces. No other abnormal marrow edema.

Cord: Evaluation of the spinal cord somewhat limited by motion. No
convincing cord signal changes identified.

Posterior Fossa, vertebral arteries, paraspinal tissues: Remote
lacunar infarct present within the pons, stable from prior (series
6, image 8). Visualized brain and posterior fossa otherwise
unremarkable. Craniocervical junction normal. Question mild edema
within the posterior paraspinous musculature, which could reflect
muscular injury and/or strain (series 9, image 13). Paraspinous soft
tissues demonstrate no other acute finding. Flow voids within the
vertebral arteries are grossly preserved.

Disc levels:

C2-C3: Unremarkable.

C3-C4:  Grossly unremarkable.

C4-C5:  Grossly unremarkable.

C5-C6: Mild disc bulge with uncovertebral spurring. Probable
extruded disc material from the level below extends into the ventral
epidural space, more pronounced on the right (series 8, image 21).
Mild spinal stenosis at this level. Suspected mild right C6
foraminal narrowing. Left neural foramen grossly patent.

C6-C7: Degenerative intervertebral disc space narrowing with disc
desiccation and diffuse disc bulge. Large central/right paracentral
disc extrusion seen effacing the ventral thecal sac (series 6, image
7). Associated superior migration to the level of the C5-6 level
above. Secondary cord flattening with severe spinal stenosis, worse
on the right. No definite cord signal changes on this motion
degraded exam, although evaluation limited. Foramina are grossly
patent.

C7-T1:  Grossly negative.

Visualized upper thoracic spine demonstrates no significant finding.
IMPRESSION: 1. Technically limited exam due to habitus and motion artifact.
2. Large central/right paracentral disc extrusion with superior
migration at C6-7 with resultant severe spinal stenosis and cord
impingement, worse on the right. No definite cord signal changes on
this motion degraded exam.
3. Disc bulge with uncovertebral spurring at C5-6 with resultant
mild canal and right C6 foraminal stenosis.
4. Remote pontine lacunar infarct, stable.

## 2022-06-10 ENCOUNTER — Ambulatory Visit (INDEPENDENT_AMBULATORY_CARE_PROVIDER_SITE_OTHER): Payer: Medicare Other | Admitting: Family Medicine

## 2022-06-10 NOTE — Progress Notes (Signed)
Chief Complaint:   OBESITY Autumn Massey is here to discuss her progress with her obesity treatment plan along with follow-up of her obesity related diagnoses. Autumn Massey is on the Category 3 Plan and states she is following her eating plan approximately 95% of the time. Autumn Massey states she is doing 0 minutes 0 times per week.  Today's visit was #: 6 Starting weight: 288 lbs Starting date: 03/11/2022 Today's weight: 275 lbs Today's date: 05/28/2022 Total lbs lost to date: 13 Total lbs lost since last in-office visit: 3  Interim History: This is my first encounter with Autumn Massey. She has been working hard and doing a good job with following her nutritional plan. She has gone back to work and she has increased NEAT. She is no longer on GLP-1 and she reports adequate satiety and satiation. She has eliminated regular soda and she is only drinking diet soda one can twice a week. We discussed medications that may cause weight gain: bisoprolol and gabapentin.   Subjective:   1. Type 2 diabetes mellitus with obesity (HCC) Autumn Massey's last A1c was 7.0 and has improved. She is on metformin and Jardiance which assist with weight management. She is no longer on Ozempic, as she stopped several months ago. I discussed labs with the patient today.  2. Essential hypertension Autumn Massey's blood pressure is well controlled on BB, HCTZ, and ARB. Stable renal parameters. I discussed labs with the patient today.   Assessment/Plan:   1. Type 2 diabetes mellitus with obesity ([place]) Autumn Massey will continue with her weight loss therapy, metformin and Jardiance.   2. Essential hypertension Autumn Massey will continue with her weight loss therapy, and will continue to monitor for orthostasis. If her blood pressure continue to decrease, team should consider stopping BB in no longer indicated, as this may help with weight loss.   3. Obesity BMI 52.0 Autumn Massey is currently in the action stage of change. As such, her goal is to continue with weight loss  efforts. She has agreed to the Category 3 Plan.   Behavioral modification strategies: increasing lean protein intake, increasing water intake, meal planning and cooking strategies, avoiding temptations, and planning for success.  Autumn Massey has agreed to follow-up with our clinic in 2 weeks. She was informed of the importance of frequent follow-up visits to maximize her success with intensive lifestyle modifications for her multiple health conditions.   Objective:   Blood pressure 116/74, pulse 77, temperature 98.1 F (36.7 C), height '5\' 1"'$  (1.549 m), weight 275 lb (124.7 kg), SpO2 93 %. Body mass index is 51.96 kg/m.  General: Cooperative, alert, well developed, in no acute distress. HEENT: Conjunctivae and lids unremarkable. Cardiovascular: Regular rhythm.  Lungs: Normal work of breathing. Neurologic: No focal deficits.   Lab Results  Component Value Date   CREATININE 0.65 03/11/2022   BUN 11 03/11/2022   NA 137 03/11/2022   K 4.4 03/11/2022   CL 91 (L) 03/11/2022   CO2 25 03/11/2022   Lab Results  Component Value Date   ALT 16 03/11/2022   AST 16 03/11/2022   ALKPHOS 86 03/11/2022   BILITOT 0.7 03/11/2022   Lab Results  Component Value Date   HGBA1C 7.0 (A) 05/02/2022   HGBA1C 7.6 (A) 01/24/2022   HGBA1C 8.4 (A) 10/21/2021   HGBA1C 8.2 (H) 07/10/2021   HGBA1C 8.4 (H) 06/17/2021   Lab Results  Component Value Date   INSULIN 7.5 03/11/2022   Lab Results  Component Value Date   TSH 3.270 03/11/2022  Lab Results  Component Value Date   CHOL 130 03/11/2022   HDL 34 (L) 03/11/2022   LDLCALC 54 03/11/2022   LDLDIRECT 92.0 07/10/2021   TRIG 268 (H) 03/11/2022   CHOLHDL 5 07/10/2021   Lab Results  Component Value Date   VD25OH 39.5 03/11/2022   VD25OH 35.27 07/10/2021   VD25OH 29.64 (L) 10/03/2020   Lab Results  Component Value Date   WBC 13.1 (H) 03/11/2022   HGB 19.6 (H) 03/11/2022   HCT 57.0 (H) 03/11/2022   MCV 92 03/11/2022   PLT 167 03/11/2022    Lab Results  Component Value Date   IRON 93 01/26/2019   FERRITIN 124.5 01/29/2009   Attestation Statements:   Reviewed by clinician on day of visit: allergies, medications, problem list, medical history, surgical history, family history, social history, and previous encounter notes.  Time spent on visit including pre-visit chart review and post-visit care and charting was 20 minutes.   Wilhemena Durie, am acting as transcriptionist for Thomes Dinning, MD.  I have reviewed the above documentation for accuracy and completeness, and I agree with the above. -Thomes Dinning, MD

## 2022-06-16 DIAGNOSIS — E1165 Type 2 diabetes mellitus with hyperglycemia: Secondary | ICD-10-CM

## 2022-06-18 ENCOUNTER — Encounter: Payer: Self-pay | Admitting: Plastic Surgery

## 2022-06-19 DIAGNOSIS — J449 Chronic obstructive pulmonary disease, unspecified: Secondary | ICD-10-CM | POA: Diagnosis not present

## 2022-06-19 MED ORDER — ACCU-CHEK GUIDE VI STRP: ORAL_STRIP | 3 refills | Status: DC

## 2022-07-02 ENCOUNTER — Encounter (INDEPENDENT_AMBULATORY_CARE_PROVIDER_SITE_OTHER): Payer: Self-pay | Admitting: Family Medicine

## 2022-07-02 ENCOUNTER — Ambulatory Visit (INDEPENDENT_AMBULATORY_CARE_PROVIDER_SITE_OTHER): Payer: Medicare Other | Admitting: Family Medicine

## 2022-07-02 VITALS — BP 110/71 | HR 58 | Temp 98.8°F | Ht 61.0 in | Wt 273.6 lb

## 2022-07-02 DIAGNOSIS — E669 Obesity, unspecified: Secondary | ICD-10-CM

## 2022-07-02 DIAGNOSIS — E559 Vitamin D deficiency, unspecified: Secondary | ICD-10-CM | POA: Diagnosis not present

## 2022-07-02 DIAGNOSIS — Z6841 Body Mass Index (BMI) 40.0 and over, adult: Secondary | ICD-10-CM | POA: Diagnosis not present

## 2022-07-02 MED ORDER — VITAMIN D (ERGOCALCIFEROL) 1.25 MG (50000 UNIT) PO CAPS: 50000.0000 [IU] | ORAL_CAPSULE | ORAL | 0 refills | Status: DC

## 2022-07-18 ENCOUNTER — Ambulatory Visit (INDEPENDENT_AMBULATORY_CARE_PROVIDER_SITE_OTHER): Payer: Medicare Other | Admitting: Internal Medicine

## 2022-07-18 ENCOUNTER — Encounter: Payer: Self-pay | Admitting: Internal Medicine

## 2022-07-18 VITALS — BP 120/62 | HR 72 | Temp 98.7°F | Ht 61.0 in | Wt 275.0 lb

## 2022-07-18 DIAGNOSIS — E7849 Other hyperlipidemia: Secondary | ICD-10-CM | POA: Diagnosis not present

## 2022-07-18 DIAGNOSIS — Z0001 Encounter for general adult medical examination with abnormal findings: Secondary | ICD-10-CM | POA: Diagnosis not present

## 2022-07-18 DIAGNOSIS — Z1211 Encounter for screening for malignant neoplasm of colon: Secondary | ICD-10-CM | POA: Diagnosis not present

## 2022-07-18 DIAGNOSIS — E039 Hypothyroidism, unspecified: Secondary | ICD-10-CM

## 2022-07-18 DIAGNOSIS — I10 Essential (primary) hypertension: Secondary | ICD-10-CM | POA: Diagnosis not present

## 2022-07-18 DIAGNOSIS — E538 Deficiency of other specified B group vitamins: Secondary | ICD-10-CM

## 2022-07-18 DIAGNOSIS — E1165 Type 2 diabetes mellitus with hyperglycemia: Secondary | ICD-10-CM | POA: Diagnosis not present

## 2022-07-18 DIAGNOSIS — E559 Vitamin D deficiency, unspecified: Secondary | ICD-10-CM | POA: Diagnosis not present

## 2022-07-18 NOTE — Patient Instructions (Signed)
Please continue all other medications as before, and refills have been done if requested.  Please have the pharmacy call with any other refills you may need.  Please continue your efforts at being more active, low cholesterol diet, and weight control.  You are otherwise up to date with prevention measures today.  Please keep your appointments with your specialists as you may have planned  You will be contacted regarding the referral for: colonoscopy  Please make an Appointment to return in 6 months, or sooner if needed

## 2022-07-18 NOTE — Assessment & Plan Note (Signed)
Last vitamin D Lab Results  Component Value Date   VD25OH 39.5 03/11/2022   Low, to start oral replacement

## 2022-07-18 NOTE — Progress Notes (Signed)
Patient ID: Autumn Massey, female   DOB: 1966/02/22, 57 y.o.   MRN: [id number]         Chief Complaint:: wellness exam and obesity, hld, low thyroid, dm, low vit d       HPI:  Autumn Massey is a 57 y.o. female here for wellness exam; missed her mammogram in October as she normally goes with mother at the same time, and plans to call for the appts; due for colonoscopy, declines covid booster, may have the shingrix at pharmacy soon                        Also, Pt denies chest pain, increased sob or doe, wheezing, orthopnea, PND, increased LE swelling, palpitations, dizziness or syncope.   Pt denies polydipsia, polyuria, or new focal neuro s/s.    Pt denies fever, wt loss, night sweats, loss of appetite, or other constitutional symptoms  Going every 2-3 wks to wellness ctr, peak wt has been 367 in the past, now much improved over 100 lbs.  Now working 3 days per wk at [employer]. Still smoking not ready to quit.  Denies worsening depressive symptoms, suicidal ideation, or panic  Wt Readings from Last 3 Encounters:  07/18/22 275 lb (124.7 kg)  07/02/22 273 lb 9.6 oz (124.1 kg)  05/28/22 275 lb (124.7 kg)   BP Readings from Last 3 Encounters:  07/18/22 120/62  07/02/22 110/71  05/28/22 116/74   Immunization History  Administered Date(s) Administered   Influenza Split 04/11/2011, 03/15/2012, 03/31/2015   Influenza Whole 02/20/2010   Influenza,inj,Quad PF,6+ Mos 08/11/2013, 03/07/2014, 03/10/2017, 03/02/2018, 04/05/2021, 05/02/2022   Pneumococcal Conjugate-13 11/22/2014   Pneumococcal Polysaccharide-23 11/04/2016   Td 01/29/2009   Tdap 01/26/2019   Health Maintenance Due  Topic Date Due   PAP SMEAR-Modifier  02/29/2020   COLONOSCOPY (Pts 45-61yr Insurance coverage will need to be confirmed)  04/30/2020   Diabetic kidney evaluation - Urine ACR  07/10/2022      Past Medical History:  Diagnosis Date   Anxiety    ANXIETY DEPRESSION 01/29/2009   04/26/2019- no current   Arthritis     Asthma    CHF ([place]estive [place] failure) [place]    DEPRESSION 11/19/2007   [place] [place]    Dyspnea    FREQUENCY, URINARY 10/17/2009   HOH (hard of hearing)    HYPERLIPIDEMIA 11/19/2007   HYPERTENSION 11/19/2007   HYPOTHYROIDISM 11/19/2007   Morbid obesity ([place]    Neuropathy 01/26/2019   Neuropathy    PNEUMONIA 08/06/2010   Pneumonia    POLYCYTHEMIA 01/29/2009   Preventative health care 04/06/2011   Sleep apnea    mild sleep apnea, on o2 at 2l at nighttime   SMurphyCCE 02/08/2009   Venous insufficiency 07/19/2012   Past Surgical History:  Procedure Laterality Date   ANTERIOR CERVICAL CORPECTOMY N/A 06/19/2021   Procedure: Cerivcal Six Corpectomy;  Surgeon: CAshok Pall MD;  [place] [facility]  Service: Neurosurgery;  Laterality: N/A;   DILITATION & CURRETTAGE/HYSTROSCOPY WITH NOVASURE ABLATION N/A 04/28/2019   Procedure: DILATATION & CURETTAGE/HYSTEROSCOPY WITH NOVASURE ABLATION;  Surgeon: HMolli Posey MD;  [facility] [facility]  Service: Gynecology;  Laterality: N/A;  [employer] rep will be here confirmed on 04/20/19   TONSILLECTOMY     ULNAR TUNNEL RELEASE Left 02/22/2013   Procedure: LEFT ULNAR NERVE DECOMPRESSION ;  Surgeon: KTennis Must MD;  [place] [facility]  Service: Orthopedics;  Laterality: Left;  reports that she has been smoking cigarettes. She has a 11.00 pack-year smoking history. She has never used smokeless tobacco. She reports that she does not currently use alcohol. She reports that she does not use drugs. family history includes Alcohol abuse in an other family member; Anxiety disorder in her mother; Arthritis in an other family member; Cancer in her maternal grandfather and mother; Heart disease in her mother; High Cholesterol in her sister; High blood pressure in her mother; Hypertension in an other family member; Osteoarthritis (age of onset: 69) in her maternal grandmother; Psoriasis in her brother;  Scoliosis in her maternal grandmother; Stroke in her mother; Thyroid disease in her mother. Allergies  Allergen Reactions   Cymbalta [Duloxetine Hcl]     Altered mental state / agitated    Current Outpatient Medications on File Prior to Visit  Medication Sig Dispense Refill   Accu-Chek Softclix Lancets lancets Use as instructed to check blood sugar once daily 100 each 3   albuterol (PROVENTIL) (2.5 MG/3ML) 0.083% nebulizer solution Take 3 mLs (2.5 mg total) by nebulization every 6 (six) hours as needed for wheezing or shortness of breath. 75 mL 3   albuterol (VENTOLIN HFA) 108 (90 Base) MCG/ACT inhaler USE 2 INHALATIONS BY MOUTH  EVERY 6 HOURS AS NEEDED 34 g 3   amoxicillin (AMOXIL) 500 MG capsule Take 500 mg by mouth 3 (three) times daily.     aspirin EC 81 MG tablet Take 81 mg by mouth daily.     bisoprolol-hydrochlorothiazide (ZIAC) 5-6.25 MG tablet TAKE 1 TABLET BY MOUTH DAILY 100 tablet 2   Blood Glucose Monitoring Suppl (ACCU-CHEK GUIDE) w/Device KIT Use as instructed to check blood sugar 2X daily 1 kit 0   BREO ELLIPTA 100-25 MCG/ACT AEPB INHALE 1 INHALATION BY  MOUTH INTO THE LUNGS IN THE MORNING 180 each 3   buPROPion (WELLBUTRIN XL) 150 MG 24 hr tablet Take 1 tablet (150 mg total) by mouth daily. 90 tablet 3   clonazePAM (KLONOPIN) 0.5 MG tablet Take 1 tablet (0.5 mg total) by mouth 2 (two) times daily as needed for anxiety. 60 tablet 5   cyclobenzaprine (FLEXERIL) 5 MG tablet TAKE 1 TABLET BY MOUTH 3  TIMES DAILY AS NEEDED FOR  MUSCLE SPASM(S) 270 tablet 1   Diclofenac Sodium (PENNSAID) 2 % SOLN Place 2 g onto the skin 2 (two) times daily. 112 g 3   empagliflozin (JARDIANCE) 25 MG TABS tablet Take 1 tablet (25 mg total) by mouth daily before breakfast. 90 tablet 3   fluconazole (DIFLUCAN) 150 MG tablet 1 tab by mouth every 3 days as needed 2 tablet 1   FLUoxetine (PROZAC) 40 MG capsule TAKE 1 CAPSULE BY MOUTH  DAILY 90 capsule 3   furosemide (LASIX) 40 MG tablet TAKE 1 TABLET BY  MOUTH DAILY 90 tablet 1   Gabapentin Enacarbil (HORIZANT) 600 MG TBCR Take 1 tablet (600 mg total) by mouth at bedtime. 90 tablet 3   glucose blood (ACCU-CHEK GUIDE) test strip Use as instructed to check blood sugar 2X daily 200 each 3   HYDROcodone-acetaminophen (NORCO/VICODIN) 5-325 MG tablet Take 1 tablet by mouth every 6 (six) hours as needed for moderate pain.     irbesartan (AVAPRO) 75 MG tablet TAKE 1 TABLET BY MOUTH AT  BEDTIME 100 tablet 2   Lancets Misc. (ACCU-CHEK FASTCLIX LANCET) KIT Use to check blood sugar once a day 1 kit 0   levothyroxine (SYNTHROID) 200 MCG tablet TAKE 1 TABLET BY MOUTH DAILY 100  tablet 2   levothyroxine (SYNTHROID) 25 MCG tablet Take 1 tablet (25 mcg total) by mouth daily before breakfast. Take with 200 mcg to equal 225 mcg daily 90 tablet 3   metFORMIN (GLUCOPHAGE-XR) 500 MG 24 hr tablet TAKE 1 TABLET BY MOUTH  DAILY WITH BREAKFAST 90 tablet 3   montelukast (SINGULAIR) 10 MG tablet TAKE 1 TABLET BY MOUTH AT  BEDTIME 90 tablet 3   Multiple Vitamin (MULTIVITAMIN WITH MINERALS) TABS tablet Take 1 tablet by mouth daily.     nystatin cream (MYCOSTATIN) Apply 1 application. topically 2 (two) times daily. 30 g 2   ondansetron (ZOFRAN ODT) 4 MG disintegrating tablet Take 1 tablet (4 mg total) by mouth every 8 (eight) hours as needed for nausea or vomiting. 4 tablet 0   OXYGEN 2lpm with sleep only  [facility]     primidone (MYSOLINE) 50 MG tablet TAKE 1 TABLET BY MOUTH IN THE  MORNING AND 3 TABLETS BY MOUTH  AT BEDTIME 360 tablet 1   rosuvastatin (CRESTOR) 40 MG tablet Take 1 tablet (40 mg total) by mouth daily. 90 tablet 3   spironolactone (ALDACTONE) 25 MG tablet TAKE 1 TABLET BY MOUTH TWICE  DAILY 200 tablet 1   Vitamin D, Ergocalciferol, (DRISDOL) 1.25 MG (50000 UNIT) CAPS capsule Take 1 capsule (50,000 Units total) by mouth every 7 (seven) days. 4 capsule 0   No current facility-administered medications on file prior to visit.        ROS:  All others reviewed and  negative.  Objective        PE:  BP 120/62 (BP Location: Left Arm, Patient Position: Sitting, Cuff Size: Large)   Pulse 72   Temp 98.7 F (37.1 C) (Oral)   Ht '5\' 1"'$  (1.549 m)   Wt 275 lb (124.7 kg)   SpO2 95%   BMI 51.96 kg/m                 Constitutional: Pt appears in NAD               HENT: Head: NCAT.                Right Ear: External ear normal.                 Left Ear: External ear normal.                Eyes: . Pupils are equal, round, and reactive to light. Conjunctivae and EOM are normal               Nose: without d/c or deformity               Neck: Neck supple. Gross normal ROM               Cardiovascular: Normal rate and regular rhythm.                 Pulmonary/Chest: Effort normal and breath sounds without rales or wheezing.                Abd:  Soft, NT, ND, + BS, no organomegaly               Neurological: Pt is alert. At baseline orientation, motor grossly intact               Skin: Skin is warm. No rashes, no other new lesions, LE edema - none  Psychiatric: Pt behavior is normal without agitation   Micro: none  Cardiac tracings I have personally interpreted today:  none  Pertinent Radiological findings (summarize): none   Lab Results  Component Value Date   WBC 13.1 (H) 03/11/2022   HGB 19.6 (H) 03/11/2022   HCT 57.0 (H) 03/11/2022   PLT 167 03/11/2022   GLUCOSE 106 (H) 03/11/2022   CHOL 130 03/11/2022   TRIG 268 (H) 03/11/2022   HDL 34 (L) 03/11/2022   LDLDIRECT 92.0 07/10/2021   LDLCALC 54 03/11/2022   ALT 16 03/11/2022   AST 16 03/11/2022   NA 137 03/11/2022   K 4.4 03/11/2022   CL 91 (L) 03/11/2022   CREATININE 0.65 03/11/2022   BUN 11 03/11/2022   CO2 25 03/11/2022   TSH 3.270 03/11/2022   HGBA1C 7.0 (A) 05/02/2022   MICROALBUR 3.4 (H) 07/10/2021   Assessment/Plan:  Autumn Massey is a 57 y.o. White or Caucasian [1] female with  has a past medical history of Anxiety, ANXIETY DEPRESSION (01/29/2009), Arthritis,  Asthma, CHF (congestive heart failure) ([place]), DEPRESSION (11/19/2007), Diabetes ([place]), Dyspnea, FREQUENCY, URINARY (10/17/2009), HOH (hard of hearing), HYPERLIPIDEMIA (11/19/2007), HYPERTENSION (11/19/2007), HYPOTHYROIDISM (11/19/2007), Morbid obesity ([place]), Neuropathy (01/26/2019), Neuropathy, PNEUMONIA (08/06/2010), Pneumonia, POLYCYTHEMIA (01/29/2009), Preventative health care (04/06/2011), Sleep apnea, SLEEP RELATED HYPOVENTILATION/HYPOXEMIA CCE (02/08/2009), and Venous insufficiency (07/19/2012).  Vitamin D deficiency Last vitamin D Lab Results  Component Value Date   VD25OH 39.5 03/11/2022   Low, to start oral replacement   Encounter for well adult exam with abnormal findings Age and sex appropriate education and counseling updated with regular exercise and diet Referrals for preventative services - pt to call for pap and mammogram, will refer for colonoscopy Immunizations addressed - declines covid booster and flu shot Smoking counseling  - pt counsled to quit, pt not ready Evidence for depression or other mood disorder - chronic anxiety depression stable Most recent labs reviewed. I have personally reviewed and have noted: 1) the patient's medical and social history 2) The patient's current medications and supplements 3) The patient's height, weight, and BMI have been recorded in the chart   HLD (hyperlipidemia) Lab Results  Component Value Date   LDLCALC 54 03/11/2022   Stable, pt to continue current statin crestor 40 mg qd   Hypothyroidism Lab Results  Component Value Date   TSH 3.270 03/11/2022   Stable, pt to continue levothyroxine 225 qd  Type 2 diabetes mellitus with hyperglycemia, without long-term current use of insulin (HCC) Lab Results  Component Value Date   HGBA1C 7.0 (A) 05/02/2022   Uncontrolled but possible improved with wt loss, declines lab today, pt to continue current medical treatment jardiance 25 mg qd as declines change   Essential  hypertension BP Readings from Last 3 Encounters:  07/18/22 120/62  07/02/22 110/71  05/28/22 116/74   Stable, pt to continue medical treatment ziac 5 6.25 mg, avapro 75 mg qd   Vitamin B12 deficiency Lab Results  Component Value Date   VITAMINB12 426 03/11/2022   Stable, cont oral replacement - b12 1000 mcg qd  Followup: Return in about 6 months (around 01/16/2023).  [name], MD 07/19/2022 7:02 PM [facility]

## 2022-07-18 NOTE — Progress Notes (Unsigned)
Chief Complaint:   OBESITY Autumn Massey is here to discuss her progress with her obesity treatment plan along with follow-up of her obesity related diagnoses. Autumn Massey is on the Category 3 Plan with breakfast and lunch options and states she is following her eating plan approximately 80% of the time. Autumn Massey states she is chair, hand weights 15-20 minutes 3 times per week.  Today's visit was #: 7 Starting weight: 288 LBS Starting date: 03/11/2022 Today's weight: 273 LBS Today's date: 07/02/2022 Total lbs lost to date: 15 LBS Total lbs lost since last in-office visit: 2 LBS  Interim History: Autumn Massey is here for a follow up office visit.  We reviewed her meal plan and all questions were answered.  Patient's food recall appears to be accurate and consistent with what is on plan when she is following it.   When eating on plan, her hunger and cravings are well controlled.   Patient had teeth issues and a dental procedure and was not able to get all of her proteins and.  Very happy with her progress.  Subjective:   1. Vitamin D deficiency Autumn Massey is tolerating medication(s) well without side effects.  Medication compliance is good as patient endorses taking it as prescribed.  Symptoms are stable and the patient denies additional concerns regarding this condition.    Assessment/Plan:  No orders of the defined types were placed in this encounter.   Medications Discontinued During This Encounter  Medication Reason   oxyCODONE (OXY IR/ROXICODONE) 5 MG immediate release tablet Change in therapy   Vitamin D, Ergocalciferol, (DRISDOL) 1.25 MG (50000 UNIT) CAPS capsule Reorder     Meds ordered this encounter  Medications   Vitamin D, Ergocalciferol, (DRISDOL) 1.25 MG (50000 UNIT) CAPS capsule    Sig: Take 1 capsule (50,000 Units total) by mouth every 7 (seven) days.    Dispense:  4 capsule    Refill:  0     1. Vitamin D deficiency - I again reiterated the importance of vitamin D  (as well as calcium) to their health and wellbeing.  - I reviewed possible symptoms of low Vitamin D:  low energy, depressed mood, muscle aches, joint aches, osteoporosis etc. - low Vitamin D levels may be linked to an increased risk of cardiovascular events and even increased risk of cancers- such as colon and breast.  - ideal vitamin D levels reviewed with patient  - I recommend pt take a 50,000 IU weekly prescription vit D - see script below   - Informed patient this may be a lifelong thing, and she was encouraged to continue to take the medicine until told otherwise.    - weight loss will likely improve availability of vitamin D, thus encouraged Autumn Massey to continue with meal plan and their weight loss efforts to further improve this condition.  Thus, we will need to monitor levels regularly (every 3-4 mo on average) to keep levels within normal limits and prevent over supplementation. - pt's questions and concerns regarding this condition addressed.   Refill- Vitamin D, Ergocalciferol, (DRISDOL) 1.25 MG (50000 UNIT) CAPS capsule; Take 1 capsule (50,000 Units total) by mouth every 7 (seven) days.  Dispense: 4 capsule; Refill: 0  2. Obesity BMI 51.7 Ideas on soups and strategies to increase protein intake discussed with patient recipe packet 1 and 2 given to patient today.  Band it exercises handout and gave bands, start 2 sets of 10,  2 days a week as tolerated.  Autumn Massey  is currently in the action stage of change. As such, her goal is to continue with weight loss efforts. She has agreed to the Category 3 Plan with breakfast and lunch options..   Exercise goals:  As is.  Behavioral modification strategies: increasing lean protein intake and decreasing simple carbohydrates.  [place] has agreed to follow-up with our clinic in 3 weeks. She was informed of the importance of frequent follow-up visits to maximize her success with intensive lifestyle modifications for her multiple health conditions.    Objective:   Blood pressure 110/71, pulse (!) 58, temperature 98.8 F (37.1 C), height '5\' 1"'$  (1.549 m), weight 273 lb 9.6 oz (124.1 kg), SpO2 98 %. Body mass index is 51.7 kg/m.  General: Cooperative, alert, well developed, in no acute distress. HEENT: Conjunctivae and lids unremarkable. Cardiovascular: Regular rhythm.  Lungs: Normal work of breathing. Neurologic: No focal deficits.   Lab Results  Component Value Date   CREATININE 0.65 03/11/2022   BUN 11 03/11/2022   NA 137 03/11/2022   K 4.4 03/11/2022   CL 91 (L) 03/11/2022   CO2 25 03/11/2022   Lab Results  Component Value Date   ALT 16 03/11/2022   AST 16 03/11/2022   ALKPHOS 86 03/11/2022   BILITOT 0.7 03/11/2022   Lab Results  Component Value Date   HGBA1C 7.0 (A) 05/02/2022   HGBA1C 7.6 (A) 01/24/2022   HGBA1C 8.4 (A) 10/21/2021   HGBA1C 8.2 (H) 07/10/2021   HGBA1C 8.4 (H) 06/17/2021   Lab Results  Component Value Date   INSULIN 7.5 03/11/2022   Lab Results  Component Value Date   TSH 3.270 03/11/2022   Lab Results  Component Value Date   CHOL 130 03/11/2022   HDL 34 (L) 03/11/2022   LDLCALC 54 03/11/2022   LDLDIRECT 92.0 07/10/2021   TRIG 268 (H) 03/11/2022   CHOLHDL 5 07/10/2021   Lab Results  Component Value Date   VD25OH 39.5 03/11/2022   VD25OH 35.27 07/10/2021   VD25OH 29.64 (L) 10/03/2020   Lab Results  Component Value Date   WBC 13.1 (H) 03/11/2022   HGB 19.6 (H) 03/11/2022   HCT 57.0 (H) 03/11/2022   MCV 92 03/11/2022   PLT 167 03/11/2022   Lab Results  Component Value Date   IRON 93 01/26/2019   FERRITIN 124.5 01/29/2009   Attestation Statements:   Reviewed by clinician on day of visit: allergies, medications, problem list, medical history, surgical history, family history, social history, and previous encounter notes.  I, Davy Pique, RMA, am acting as Location manager for [employer], DO.   I have reviewed the above documentation for accuracy and  completeness, and I agree with the above. Marjory Sneddon, D.O.  The [place] was signed into law in 2016 which includes the topic of electronic health records.  This provides immediate access to information in MyChart.  This includes consultation notes, operative notes, office notes, lab results and pathology reports.  If you have any questions about what you read please let us know at your next visit so we can discuss your concerns and take corrective action if need be.  We are right here with you.

## 2022-07-19 ENCOUNTER — Encounter: Payer: Self-pay | Admitting: Internal Medicine

## 2022-07-19 NOTE — Assessment & Plan Note (Signed)
BP Readings from Last 3 Encounters:  07/18/22 120/62  07/02/22 110/71  05/28/22 116/74   Stable, pt to continue medical treatment ziac 5 6.25 mg, avapro 75 mg qd

## 2022-07-19 NOTE — Assessment & Plan Note (Addendum)
Lab Results  Component Value Date   HGBA1C 7.0 (A) 05/02/2022   Uncontrolled but possible improved with wt loss, declines lab today, pt to continue current medical treatment jardiance 25 mg qd as declines change

## 2022-07-19 NOTE — Assessment & Plan Note (Signed)
Lab Results  Component Value Date   VITAMINB12 426 03/11/2022   Stable, cont oral replacement - b12 1000 mcg qd

## 2022-07-19 NOTE — Assessment & Plan Note (Signed)
Lab Results  Component Value Date   LDLCALC 54 03/11/2022   Stable, pt to continue current statin crestor 40 mg qd

## 2022-07-19 NOTE — Assessment & Plan Note (Signed)
Age and sex appropriate education and counseling updated with regular exercise and diet Referrals for preventative services - pt to call for pap and mammogram, will refer for colonoscopy Immunizations addressed - declines covid booster and flu shot Smoking counseling  - pt counsled to quit, pt not ready Evidence for depression or other mood disorder - chronic anxiety depression stable Most recent labs reviewed. I have personally reviewed and have noted: 1) the patient's medical and social history 2) The patient's current medications and supplements 3) The patient's height, weight, and BMI have been recorded in the chart

## 2022-07-19 NOTE — Assessment & Plan Note (Signed)
Lab Results  Component Value Date   TSH 3.270 03/11/2022   Stable, pt to continue levothyroxine 225 qd

## 2022-07-20 DIAGNOSIS — J449 Chronic obstructive pulmonary disease, unspecified: Secondary | ICD-10-CM | POA: Diagnosis not present

## 2022-07-24 ENCOUNTER — Ambulatory Visit (INDEPENDENT_AMBULATORY_CARE_PROVIDER_SITE_OTHER): Payer: Medicare Other | Admitting: Family Medicine

## 2022-07-24 ENCOUNTER — Encounter (INDEPENDENT_AMBULATORY_CARE_PROVIDER_SITE_OTHER): Payer: Self-pay | Admitting: Family Medicine

## 2022-07-24 VITALS — BP 101/66 | HR 74 | Temp 98.1°F | Ht 61.0 in | Wt 275.4 lb

## 2022-07-24 DIAGNOSIS — E1169 Type 2 diabetes mellitus with other specified complication: Secondary | ICD-10-CM | POA: Diagnosis not present

## 2022-07-24 DIAGNOSIS — Z6841 Body Mass Index (BMI) 40.0 and over, adult: Secondary | ICD-10-CM | POA: Diagnosis not present

## 2022-07-24 DIAGNOSIS — E559 Vitamin D deficiency, unspecified: Secondary | ICD-10-CM

## 2022-07-24 DIAGNOSIS — E669 Obesity, unspecified: Secondary | ICD-10-CM | POA: Diagnosis not present

## 2022-07-24 DIAGNOSIS — Z7984 Long term (current) use of oral hypoglycemic drugs: Secondary | ICD-10-CM

## 2022-07-24 MED ORDER — VITAMIN D (ERGOCALCIFEROL) 1.25 MG (50000 UNIT) PO CAPS: 50000.0000 [IU] | ORAL_CAPSULE | ORAL | 0 refills | Status: DC

## 2022-08-12 NOTE — Progress Notes (Signed)
Chief Complaint:   OBESITY Autumn Massey is here to discuss her progress with her obesity treatment plan along with follow-up of her obesity related diagnoses. Barnetta is on the Category 3 Plan and states she is following her eating plan approximately 60% of the time. Allura states she is resistance bands and hand weights 10-15 minutes 2-3 times per week.  Today's visit was #: 8 Starting weight: 288 lbs Starting date: 03/11/2022 Today's weight: 275 lbs Today's date: 07/24/2022 Total lbs lost to date: 13 lbs Total lbs lost since last in-office visit: +2 lbs  Interim History: Patient had dental surgery recently and was not able to eat much except soft foods.  I.e. mashed potato and macaroni salad.  Subjective:   1. Type 2 diabetes mellitus with obesity (HCC) A1c 2 months ago was 7.0 with Dr. Cruzita Lederer and very well-controlled versus prior.  Patient is taking Jardiance, metformin per Dr. Cruzita Lederer.  2. Vitamin D deficiency Autumn Massey is tolerating medication(s) well without side effects.  Medication compliance is good as patient endorses taking it as prescribed.  Symptoms are stable and the patient denies additional concerns regarding this condition.    Assessment/Plan:  No orders of the defined types were placed in this encounter.   Medications Discontinued During This Encounter  Medication Reason   Vitamin D, Ergocalciferol, (DRISDOL) 1.25 MG (50000 UNIT) CAPS capsule Reorder     Meds ordered this encounter  Medications   Vitamin D, Ergocalciferol, (DRISDOL) 1.25 MG (50000 UNIT) CAPS capsule    Sig: Take 1 capsule (50,000 Units total) by mouth every 7 (seven) days.    Dispense:  4 capsule    Refill:  0     1. Type 2 diabetes mellitus with obesity (HCC) Continue PNP and weight loss.  Decrease simple carbs and increase protein.  2. Vitamin D deficiency Recheck vitamin D at next office visit.  Refill- Vitamin D, Ergocalciferol, (DRISDOL) 1.25 MG (50000 UNIT) CAPS capsule;  Take 1 capsule (50,000 Units total) by mouth every 7 (seven) days.  Dispense: 4 capsule; Refill: 0  3. Obesity BMI 52.0 Decrease simple carbs, patient prefers to journal and stay accountable.  [name] is currently in the action stage of change. As such, her goal is to continue with weight loss efforts. She has agreed to keeping a food journal and adhering to recommended goals of 1600-1700 calories and 120+ protein.   Exercise goals: All adults should avoid inactivity. Some physical activity is better than none, and adults who participate in any amount of physical activity gain some health benefits.  Behavioral modification strategies: increasing lean protein intake, decreasing simple carbohydrates, and planning for success.  Arther has agreed to follow-up with our clinic in 3 weeks. She was informed of the importance of frequent follow-up visits to maximize her success with intensive lifestyle modifications for her multiple health conditions.   Objective:   Blood pressure 101/66, pulse 74, temperature 98.1 F (36.7 C), height 5' 1"$  (1.549 m), weight 275 lb 6.4 oz (124.9 kg), SpO2 91 %. Body mass index is 52.04 kg/m.  General: Cooperative, alert, well developed, in no acute distress. HEENT: Conjunctivae and lids unremarkable. Cardiovascular: Regular rhythm.  Lungs: Normal work of breathing. Neurologic: No focal deficits.   Lab Results  Component Value Date   CREATININE 0.65 03/11/2022   BUN 11 03/11/2022   NA 137 03/11/2022   K 4.4 03/11/2022   CL 91 (L) 03/11/2022   CO2 25 03/11/2022   Lab Results  Component  Value Date   ALT 16 03/11/2022   AST 16 03/11/2022   ALKPHOS 86 03/11/2022   BILITOT 0.7 03/11/2022   Lab Results  Component Value Date   HGBA1C 7.0 (A) 05/02/2022   HGBA1C 7.6 (A) 01/24/2022   HGBA1C 8.4 (A) 10/21/2021   HGBA1C 8.2 (H) 07/10/2021   HGBA1C 8.4 (H) 06/17/2021   Lab Results  Component Value Date   INSULIN 7.5 03/11/2022   Lab Results  Component  Value Date   TSH 3.270 03/11/2022   Lab Results  Component Value Date   CHOL 130 03/11/2022   HDL 34 (L) 03/11/2022   LDLCALC 54 03/11/2022   LDLDIRECT 92.0 07/10/2021   TRIG 268 (H) 03/11/2022   CHOLHDL 5 07/10/2021   Lab Results  Component Value Date   VD25OH 39.5 03/11/2022   VD25OH 35.27 07/10/2021   VD25OH 29.64 (L) 10/03/2020   Lab Results  Component Value Date   WBC 13.1 (H) 03/11/2022   HGB 19.6 (H) 03/11/2022   HCT 57.0 (H) 03/11/2022   MCV 92 03/11/2022   PLT 167 03/11/2022   Lab Results  Component Value Date   IRON 93 01/26/2019   FERRITIN 124.5 01/29/2009   Attestation Statements:   Reviewed by clinician on day of visit: allergies, medications, problem list, medical history, surgical history, family history, social history, and previous encounter notes.  I, Davy Pique, RMA, am acting as Location manager for [employer], DO.  I have reviewed the above documentation for accuracy and completeness, and I agree with the above. Marjory Sneddon, D.O.  The [place] was signed into law in 2016 which includes the topic of electronic health records.  This provides immediate access to information in MyChart.  This includes consultation notes, operative notes, office notes, lab results and pathology reports.  If you have any questions about what you read please let us know at your next visit so we can discuss your concerns and take corrective action if need be.  We are right here with you.

## 2022-08-13 ENCOUNTER — Ambulatory Visit (INDEPENDENT_AMBULATORY_CARE_PROVIDER_SITE_OTHER): Payer: Medicare Other | Admitting: Family Medicine

## 2022-08-18 DIAGNOSIS — M50223 Other cervical disc displacement at C6-C7 level: Secondary | ICD-10-CM | POA: Diagnosis not present

## 2022-08-20 DIAGNOSIS — J449 Chronic obstructive pulmonary disease, unspecified: Secondary | ICD-10-CM | POA: Diagnosis not present

## 2022-08-21 ENCOUNTER — Ambulatory Visit (INDEPENDENT_AMBULATORY_CARE_PROVIDER_SITE_OTHER): Payer: Medicare Other | Admitting: Family Medicine

## 2022-08-24 ENCOUNTER — Other Ambulatory Visit: Payer: Self-pay | Admitting: Neurology

## 2022-08-24 ENCOUNTER — Other Ambulatory Visit: Payer: Self-pay | Admitting: Internal Medicine

## 2022-08-24 DIAGNOSIS — E039 Hypothyroidism, unspecified: Secondary | ICD-10-CM

## 2022-09-08 ENCOUNTER — Ambulatory Visit (INDEPENDENT_AMBULATORY_CARE_PROVIDER_SITE_OTHER): Payer: Medicare Other | Admitting: Internal Medicine

## 2022-09-08 ENCOUNTER — Encounter (INDEPENDENT_AMBULATORY_CARE_PROVIDER_SITE_OTHER): Payer: Self-pay | Admitting: Internal Medicine

## 2022-09-08 DIAGNOSIS — Z6841 Body Mass Index (BMI) 40.0 and over, adult: Secondary | ICD-10-CM

## 2022-09-08 DIAGNOSIS — E559 Vitamin D deficiency, unspecified: Secondary | ICD-10-CM

## 2022-09-08 DIAGNOSIS — D751 Secondary polycythemia: Secondary | ICD-10-CM | POA: Diagnosis not present

## 2022-09-08 MED ORDER — DEXAMETHASONE 1 MG PO TABS: 1.0000 mg | ORAL_TABLET | Freq: Once | ORAL | 0 refills | Status: AC

## 2022-09-08 MED ORDER — VITAMIN D (ERGOCALCIFEROL) 1.25 MG (50000 UNIT) PO CAPS: 50000.0000 [IU] | ORAL_CAPSULE | ORAL | 0 refills | Status: DC

## 2022-09-08 NOTE — Progress Notes (Unsigned)
Office: [phone]  /  Fax: [phone]  WEIGHT SUMMARY AND BIOMETRICS  Vitals Temp: (!) 97.5 F (36.4 C) BP: 108/76 Pulse Rate: 71 SpO2: 91 %   Anthropometric Measurements Height: '5\' 1"'$  (1.549 m) Weight: 271 lb (122.9 kg) BMI (Calculated): 51.23 Weight at Last Visit: 273 lb Weight Lost Since Last Visit: 2 lb Starting Weight: 288 lb Total Weight Loss (lbs): 15 lb (6.804 kg) Peak Weight: 367 lb   Body Composition  Body Fat %: 55.5 % Fat Mass (lbs): 150.8 lbs Muscle Mass (lbs): 114.8 lbs Visceral Fat Rating : 21    HPI  Chief Complaint: OBESITY  Autumn Massey is here to discuss her progress with her obesity treatment plan. She is on the the Category 3 Plan and states she is following her eating plan approximately 80 % of the time. She states she is exercising 15 minutes 3 times per week.  Interval History:  Since last office visit she has 2 pounds. She reports good adherence to reduced calorie nutritional plan. She has been working on not skipping meals, increasing protein and fruits and vegetables in her diet '[x]'$ Denies '[]'$ Reports problems with appetite and hunger signals.  '[x]'$ Denies '[]'$ Reports problems with satiety and satiation.  '[x]'$ Denies '[]'$ Reports problems with eating patterns and portion control.  '[x]'$ Denies '[]'$ Reports abnormal cravings Barriers identified none.   Pharmacotherapy for weight loss: She is currently taking no anti-obesity medication.   Weight promoting medications identified: Antiepileptics.  ASSESSMENT AND PLAN  TREATMENT PLAN FOR OBESITY:  Recommended Dietary Goals  Gyla is currently in the action stage of change. As such, her goal is to continue weight management plan. She has agreed to: the Category 3 Plan.  Behavioral Intervention  We discussed the following Behavioral Modification Strategies today: increasing lean protein intake, increasing vegetables, increasing water intake, work on meal planning and easy cooking plans, work on  tracking and journaling calories using tracking App, decreasing eating out, consumption of processed foods, and making healthy choices when eating convenient foods, and reading food labels .  Additional resources provided today: Handout on healthy eating and balanced plate and Handout on complex carbohydrates and lean sources of protein  Recommended Physical Activity Goals  Elantra has been advised to work up to 150 minutes of moderate intensity aerobic activity a week and strengthening exercises 2-3 times per week for cardiovascular health, weight loss maintenance and preservation of muscle mass.   She has agreed to :  '[]'$  Continue current level of physical activity  '[x]'$  Think about ways to increase physical activity '[]'$  Start strengthening exercises with a goal of 2-3 sessions a week  '[]'$  Start aerobic activity with a goal of 150 minutes a week at moderate intensity.  '[]'$  Increase the intensity, frequency or duration of strengthening exercises  '[]'$  Increase the intensity, frequency or duration of aerobic exercises   '[]'$  Increase physical activity in their day and reduce sedentary time (increase NEAT). '[]'$  Work on scheduling and tracking physical activity.   Pharmacotherapy We discussed various medication options to help [place] with her weight loss efforts and we both agreed to : continue with nutritional and behavioral strategies  ASSOCIATED CONDITIONS ADDRESSED TODAY  Class 3 severe obesity with serious comorbidity and body mass index (BMI) of 50.0 to 59.9 in adult, unspecified obesity type (HCC) -     Cortisol -     dexAMETHasone; Take 1 tablet (1 mg total) by mouth once for 1 dose.  Dispense: 1 tablet; Refill: 0  Polycythemia Assessment & Plan: Her most recent  CBC showed a hemoglobin of 19.  She may be at risk for hyperviscosity syndrome.  She is currently asymptomatic.  We will check a CBC and have her follow-up with primary care or hematologist if hemoglobin remains elevated following  initiation of oxygen therapy.  Orders: -     CBC With Differential -     CMP14+EGFR  Vitamin D deficiency Assessment & Plan: Most recent vitamin D levels  Lab Results  Component Value Date   VD25OH 39.5 03/11/2022   VD25OH 35.27 07/10/2021   VD25OH 29.64 (L) 10/03/2020     Deficiency state associated with adiposity and may result in leptin resistance, weight gain and fatigue. Currently on vitamin D supplementation without any adverse effects.  Plan: Medication refill for 4 weeks.  Recheck vitamin D levels with next set of labs and transition to over-the-counter supplementation.   Orders: -     Vitamin D (Ergocalciferol); Take 1 capsule (50,000 Units total) by mouth every 7 (seven) days.  Dispense: 4 capsule; Refill: 0     PHYSICAL EXAM:  Blood pressure 108/76, pulse 71, temperature (!) 97.5 F (36.4 C), height '5\' 1"'$  (1.549 m), weight 271 lb (122.9 kg), SpO2 91 %. Body mass index is 51.21 kg/m.  General: She is overweight, cooperative, alert, well developed, and in no acute distress. PSYCH: Has normal mood, affect and thought process.   HEENT: EOMI, sclerae are anicteric. Lungs: Normal breathing effort, no conversational dyspnea. Extremities: No edema.  Neurologic: No gross sensory or motor deficits. No tremors or fasciculations noted.    DIAGNOSTIC DATA REVIEWED:  BMET    Component Value Date/Time   NA 137 03/11/2022 1554   K 4.4 03/11/2022 1554   CL 91 (L) 03/11/2022 1554   CO2 25 03/11/2022 1554   GLUCOSE 106 (H) 03/11/2022 1554   GLUCOSE 122 (H) 07/10/2021 1638   BUN 11 03/11/2022 1554   CREATININE 0.65 03/11/2022 1554   CREATININE 0.73 03/02/2018 1157   CALCIUM 9.8 03/11/2022 1554   GFRNONAA >60 06/17/2021 1102   GFRNONAA 95 03/02/2018 1157   GFRAA >60 04/26/2019 1445   GFRAA 110 03/02/2018 1157   Lab Results  Component Value Date   HGBA1C 7.0 (A) 05/02/2022   HGBA1C 5.6 03/15/2012   Lab Results  Component Value Date   INSULIN 7.5 03/11/2022    Lab Results  Component Value Date   TSH 3.270 03/11/2022   CBC    Component Value Date/Time   WBC 13.1 (H) 03/11/2022 1554   WBC 10.8 (H) 10/11/2021 1429   RBC 6.18 (H) 03/11/2022 1554   RBC 5.71 (H) 10/11/2021 1429   HGB 19.6 (H) 03/11/2022 1554   HCT 57.0 (H) 03/11/2022 1554   PLT 167 03/11/2022 1554   MCV 92 03/11/2022 1554   MCH 31.7 03/11/2022 1554   MCH 31.0 06/19/2021 0651   MCHC 34.4 03/11/2022 1554   MCHC 32.8 10/11/2021 1429   RDW 12.1 03/11/2022 1554   Iron Studies    Component Value Date/Time   IRON 93 01/26/2019 0959   FERRITIN 124.5 01/29/2009 0905   IRONPCTSAT 31.3 01/26/2019 0959   Lipid Panel     Component Value Date/Time   CHOL 130 03/11/2022 1554   TRIG 268 (H) 03/11/2022 1554   HDL 34 (L) 03/11/2022 1554   CHOLHDL 5 07/10/2021 1638   VLDL 52.6 (H) 07/10/2021 1638   LDLCALC 54 03/11/2022 1554   LDLCALC 109 (H) 01/27/2020 1006   LDLDIRECT 92.0 07/10/2021 1638   Hepatic Function Panel  Component Value Date/Time   PROT 6.9 03/11/2022 1554   ALBUMIN 4.3 03/11/2022 1554   AST 16 03/11/2022 1554   ALT 16 03/11/2022 1554   ALKPHOS 86 03/11/2022 1554   BILITOT 0.7 03/11/2022 1554   BILIDIR 0.1 07/10/2021 1638   IBILI 0.4 01/27/2020 1006      Component Value Date/Time   TSH 3.270 03/11/2022 1554   Nutritional Lab Results  Component Value Date   VD25OH 39.5 03/11/2022   VD25OH 35.27 07/10/2021   VD25OH 29.64 (L) 10/03/2020     Return in about 2 weeks (around 09/22/2022) for For Weight Mangement with Dr. Gerarda Fraction.[name] She was informed of the importance of frequent follow up visits to maximize her success with intensive lifestyle modifications for her multiple health conditions.   ATTESTASTION STATEMENTS:  Reviewed by clinician on day of visit: allergies, medications, problem list, medical history, surgical history, family history, social history, and previous encounter notes.   Time spent on visit including pre-visit chart review and  post-visit care and charting was 30 minutes.    Thomes Dinning, MD

## 2022-09-09 NOTE — Assessment & Plan Note (Signed)
Most recent vitamin D levels  Lab Results  Component Value Date   VD25OH 39.5 03/11/2022   VD25OH 35.27 07/10/2021   VD25OH 29.64 (L) 10/03/2020     Deficiency state associated with adiposity and may result in leptin resistance, weight gain and fatigue. Currently on vitamin D supplementation without any adverse effects.  Plan: Medication refill for 4 weeks.  Recheck vitamin D levels with next set of labs and transition to over-the-counter supplementation.

## 2022-09-09 NOTE — Assessment & Plan Note (Signed)
Her most recent CBC showed a hemoglobin of 19.  She may be at risk for hyperviscosity syndrome.  She is currently asymptomatic.  We will check a CBC and have her follow-up with primary care or hematologist if hemoglobin remains elevated following initiation of oxygen therapy.

## 2022-09-11 ENCOUNTER — Other Ambulatory Visit: Payer: Medicare Other

## 2022-09-11 ENCOUNTER — Other Ambulatory Visit (HOSPITAL_COMMUNITY)
Admission: RE | Admit: 2022-09-11 | Discharge: 2022-09-11 | Disposition: A | Discharge status: Home / Self Care | Payer: Medicare Other | Source: Ambulatory Visit | Attending: Internal Medicine | Admitting: Internal Medicine

## 2022-09-11 DIAGNOSIS — Z6841 Body Mass Index (BMI) 40.0 and over, adult: Secondary | ICD-10-CM | POA: Diagnosis present

## 2022-09-11 DIAGNOSIS — D751 Secondary polycythemia: Secondary | ICD-10-CM | POA: Insufficient documentation

## 2022-09-11 LAB — CBC WITH DIFFERENTIAL/PLATELET
Abs Immature Granulocytes: 0.03 10*3/uL (ref 0.00–0.07)
Basophils Absolute: 0 10*3/uL (ref 0.0–0.1)
Basophils Relative: 0 %
Eosinophils Absolute: 0.1 10*3/uL (ref 0.0–0.5)
Eosinophils Relative: 1 %
HCT: 52.4 % — ABNORMAL HIGH (ref 36.0–46.0)
Hemoglobin: 16.6 g/dL — ABNORMAL HIGH (ref 12.0–15.0)
Immature Granulocytes: 0 %
Lymphocytes Relative: 12 %
Lymphs Abs: 1.1 10*3/uL (ref 0.7–4.0)
MCH: 30.3 pg (ref 26.0–34.0)
MCHC: 31.7 g/dL (ref 30.0–36.0)
MCV: 95.8 fL (ref 80.0–100.0)
Monocytes Absolute: 0.5 10*3/uL (ref 0.1–1.0)
Monocytes Relative: 5 %
Neutro Abs: 7.5 10*3/uL (ref 1.7–7.7)
Neutrophils Relative %: 82 %
Platelets: 162 10*3/uL (ref 150–400)
RBC: 5.47 MIL/uL — ABNORMAL HIGH (ref 3.87–5.11)
RDW: 13.9 % (ref 11.5–15.5)
WBC: 9.2 10*3/uL (ref 4.0–10.5)
nRBC: 0 % (ref 0.0–0.2)

## 2022-09-11 LAB — COMPREHENSIVE METABOLIC PANEL
ALT: 32 U/L (ref 0–44)
AST: 26 U/L (ref 15–41)
Albumin: 3.9 g/dL (ref 3.5–5.0)
Alkaline Phosphatase: 69 U/L (ref 38–126)
Anion gap: 13 (ref 5–15)
BUN: 23 mg/dL — ABNORMAL HIGH (ref 6–20)
CO2: 27 mmol/L (ref 22–32)
Calcium: 9.4 mg/dL (ref 8.9–10.3)
Chloride: 99 mmol/L (ref 98–111)
Creatinine, Ser: 0.67 mg/dL (ref 0.44–1.00)
GFR, Estimated: >60 mL/min (ref >60)
Glucose, Bld: 130 mg/dL — ABNORMAL HIGH (ref 70–99)
Potassium: 4.2 mmol/L (ref 3.5–5.1)
Sodium: 139 mmol/L (ref 135–145)
Total Bilirubin: 0.6 mg/dL (ref 0.3–1.2)
Total Protein: 7.8 g/dL (ref 6.5–8.1)

## 2022-09-11 LAB — VITAMIN D 25 HYDROXY (VIT D DEFICIENCY, FRACTURES): Vit D, 25-Hydroxy: 83.22 ng/mL (ref 30–100)

## 2022-09-11 LAB — CORTISOL: Cortisol, Plasma: 1.3 ug/dL

## 2022-09-18 DIAGNOSIS — J449 Chronic obstructive pulmonary disease, unspecified: Secondary | ICD-10-CM | POA: Diagnosis not present

## 2022-09-29 ENCOUNTER — Ambulatory Visit (INDEPENDENT_AMBULATORY_CARE_PROVIDER_SITE_OTHER): Payer: Medicare Other | Admitting: Internal Medicine

## 2022-10-06 ENCOUNTER — Ambulatory Visit (INDEPENDENT_AMBULATORY_CARE_PROVIDER_SITE_OTHER): Payer: Medicare Other | Admitting: Family Medicine

## 2022-10-06 ENCOUNTER — Encounter: Payer: Self-pay | Admitting: Family Medicine

## 2022-10-06 VITALS — BP 118/60 | HR 80 | Temp 98.0°F | Resp 22 | Ht 61.0 in | Wt 282.0 lb

## 2022-10-06 DIAGNOSIS — J302 Other seasonal allergic rhinitis: Secondary | ICD-10-CM

## 2022-10-06 MED ORDER — AZELASTINE-FLUTICASONE 137-50 MCG/ACT NA SUSP
1.0000 | Freq: Two times a day (BID) | NASAL | 0 refills | Status: AC
Start: 2022-10-06 — End: ?

## 2022-10-06 MED ORDER — LEVOCETIRIZINE DIHYDROCHLORIDE 5 MG PO TABS
5.0000 mg | ORAL_TABLET | Freq: Every evening | ORAL | 2 refills | Status: DC
Start: 2022-10-06 — End: 2023-04-27

## 2022-10-06 NOTE — Progress Notes (Signed)
Assessment & Plan:  1. Seasonal allergies Patient feels she is overall doing well, just needs something to help with her left ear..  Education provided on allergies.  Started patient on Dymista nasal spray and Xyzal. - Azelastine-Fluticasone 137-50 MCG/ACT SUSP; Place 1 spray into the nose every 12 (twelve) hours.  Dispense: 23 g; Refill: 0 - levocetirizine (XYZAL) 5 MG tablet; Take 1 tablet (5 mg total) by mouth every evening.  Dispense: 30 tablet; Refill: 2  No results found for any visits on 10/06/22.  Follow up plan: Return if symptoms worsen or fail to improve.  Deliah BostonBritney Kage Willmann, MSN, APRN, FNP-C  Subjective:  HPI: Autumn Massey is a 57 y.o. female presenting on 10/06/2022 for Sinusitis (Sinus infection, slight cough and congestion - this is all a bit better (using mucinex and delsym) /Ear pain x 2 days now -LEFT)  Patient complains of cough, chest congestion, ear pain/pressure, and facial pain/pressure. She denies fever, shortness of breath, and wheezing. Onset of symptoms was 3 days ago, gradually improving since that time. She is drinking plenty of fluids. Evaluation to date: none. Treatment to date:  Mucinex and Delsym . She has a history of COPD. She does smoke.    ROS: Negative unless specifically indicated above in HPI.   Relevant past medical history reviewed and updated as indicated.   Allergies and medications reviewed and updated.   Current Outpatient Medications:    Accu-Chek Softclix Lancets lancets, Use as instructed to check blood sugar once daily, Disp: 100 each, Rfl: 3   albuterol (PROVENTIL) (2.5 MG/3ML) 0.083% nebulizer solution, Take 3 mLs (2.5 mg total) by nebulization every 6 (six) hours as needed for wheezing or shortness of breath., Disp: 75 mL, Rfl: 3   albuterol (VENTOLIN HFA) 108 (90 Base) MCG/ACT inhaler, USE 2 INHALATIONS BY MOUTH  EVERY 6 HOURS AS NEEDED, Disp: 34 g, Rfl: 3   aspirin EC 81 MG tablet, Take 81 mg by mouth daily., Disp: , Rfl:     bisoprolol-hydrochlorothiazide (ZIAC) 5-6.25 MG tablet, TAKE 1 TABLET BY MOUTH DAILY, Disp: 100 tablet, Rfl: 2   Blood Glucose Monitoring Suppl (ACCU-CHEK GUIDE) w/Device KIT, Use as instructed to check blood sugar 2X daily, Disp: 1 kit, Rfl: 0   BREO ELLIPTA 100-25 MCG/ACT AEPB, INHALE 1 INHALATION BY  MOUTH INTO THE LUNGS IN THE MORNING, Disp: 180 each, Rfl: 3   cholecalciferol (VITAMIN D3) 25 MCG (1000 UNIT) tablet, Take 1,000 Units by mouth daily., Disp: , Rfl:    Diclofenac Sodium (PENNSAID) 2 % SOLN, Place 2 g onto the skin 2 (two) times daily., Disp: 112 g, Rfl: 3   empagliflozin (JARDIANCE) 25 MG TABS tablet, TAKE 1 TABLET BY MOUTH DAILY  BEFORE BREAKFAST, Disp: 100 tablet, Rfl: 0   FLUoxetine (PROZAC) 40 MG capsule, TAKE 1 CAPSULE BY MOUTH DAILY, Disp: 100 capsule, Rfl: 3   Gabapentin Enacarbil (HORIZANT) 600 MG TBCR, Take 1 tablet (600 mg total) by mouth at bedtime., Disp: 90 tablet, Rfl: 3   glucose blood (ACCU-CHEK GUIDE) test strip, Use as instructed to check blood sugar 2X daily, Disp: 200 each, Rfl: 3   HYDROcodone-acetaminophen (NORCO/VICODIN) 5-325 MG tablet, Take 1 tablet by mouth every 6 (six) hours as needed for moderate pain., Disp: , Rfl:    irbesartan (AVAPRO) 75 MG tablet, TAKE 1 TABLET BY MOUTH AT  BEDTIME, Disp: 100 tablet, Rfl: 2   Lancets Misc. (ACCU-CHEK FASTCLIX LANCET) KIT, Use to check blood sugar once a day, Disp: 1 kit,  Rfl: 0   levothyroxine (SYNTHROID) 200 MCG tablet, TAKE 1 TABLET BY MOUTH DAILY, Disp: 100 tablet, Rfl: 2   levothyroxine (SYNTHROID) 25 MCG tablet, TAKE 1 TABLET BY MOUTH DAILY  BEFORE BREAKFAST TAKE WITH  TABLET FOR A TOTAL DAILY  DOSE OF ., Disp: 100 tablet, Rfl: 0   metFORMIN (GLUCOPHAGE-XR) 500 MG 24 hr tablet, TAKE 1 TABLET BY MOUTH  DAILY WITH BREAKFAST, Disp: 90 tablet, Rfl: 3   montelukast (SINGULAIR) 10 MG tablet, TAKE 1 TABLET BY MOUTH AT  BEDTIME, Disp: 100 tablet, Rfl: 3   Multiple Vitamin (MULTIVITAMIN WITH MINERALS) TABS  tablet, Take 1 tablet by mouth daily., Disp: , Rfl:    nystatin cream (MYCOSTATIN), Apply 1 application. topically 2 (two) times daily., Disp: 30 g, Rfl: 2   OXYGEN, 2lpm with sleep only  AHC, Disp: , Rfl:    primidone (MYSOLINE) 50 MG tablet, TAKE 1 TABLET BY MOUTH IN THE  MORNING AND 3 TABLETS BY MOUTH  AT BEDTIME, Disp: 400 tablet, Rfl: 2   rosuvastatin (CRESTOR) 40 MG tablet, Take 1 tablet (40 mg total) by mouth daily., Disp: 90 tablet, Rfl: 3   spironolactone (ALDACTONE) 25 MG tablet, TAKE 1 TABLET BY MOUTH TWICE  DAILY, Disp: 200 tablet, Rfl: 1   clonazePAM (KLONOPIN) 0.5 MG tablet, Take 1 tablet (0.5 mg total) by mouth 2 (two) times daily as needed for anxiety. (Patient not taking: Reported on 10/06/2022), Disp: 60 tablet, Rfl: 5   cyclobenzaprine (FLEXERIL) 5 MG tablet, TAKE 1 TABLET BY MOUTH 3  TIMES DAILY AS NEEDED FOR  MUSCLE SPASM(S) (Patient not taking: Reported on 10/06/2022), Disp: 270 tablet, Rfl: 1   fluconazole (DIFLUCAN) 150 MG tablet, 1 tab by mouth every 3 days as needed (Patient not taking: Reported on 10/06/2022), Disp: 2 tablet, Rfl: 1   furosemide (LASIX) 40 MG tablet, TAKE 1 TABLET BY MOUTH DAILY (Patient not taking: Reported on 10/06/2022), Disp: 90 tablet, Rfl: 1   ondansetron (ZOFRAN ODT) 4 MG disintegrating tablet, Take 1 tablet (4 mg total) by mouth every 8 (eight) hours as needed for nausea or vomiting. (Patient not taking: Reported on 10/06/2022), Disp: 4 tablet, Rfl: 0  Allergies  Allergen Reactions   Cymbalta [Duloxetine Hcl]     Altered mental state / agitated     Objective:   BP 118/60   Pulse 80   Temp 98 F (36.7 C)   Resp (!) 22   Ht 5\' 1"  (1.549 m)   Wt 282 lb (127.9 kg)   BMI 53.28 kg/m    Physical Exam Vitals reviewed.  Constitutional:      General: She is not in acute distress.    Appearance: Normal appearance. She is not ill-appearing, toxic-appearing or diaphoretic.  HENT:     Head: Normocephalic and atraumatic.     Right Ear: Ear canal and  external ear normal. No middle ear effusion. There is no impacted cerumen. Tympanic membrane is scarred. Tympanic membrane is not erythematous or bulging.     Left Ear: Ear canal and external ear normal.  No middle ear effusion. There is no impacted cerumen. Tympanic membrane is scarred and bulging. Tympanic membrane is not erythematous.     Nose: No congestion or rhinorrhea.     Right Sinus: Frontal sinus tenderness present. No maxillary sinus tenderness.     Left Sinus: Frontal sinus tenderness present. No maxillary sinus tenderness.     Mouth/Throat:     Mouth: Mucous membranes are moist.  Pharynx: Oropharynx is clear. No oropharyngeal exudate or posterior oropharyngeal erythema.  Eyes:     General: No scleral icterus.       Right eye: No discharge.        Left eye: No discharge.     Conjunctiva/sclera: Conjunctivae normal.  Cardiovascular:     Rate and Rhythm: Normal rate and regular rhythm.     Heart sounds: Normal heart sounds. No murmur heard.    No friction rub. No gallop.  Pulmonary:     Effort: Pulmonary effort is normal. No respiratory distress.     Breath sounds: Normal breath sounds. No stridor. No wheezing, rhonchi or rales.  Musculoskeletal:        General: Normal range of motion.     Cervical back: Normal range of motion.  Lymphadenopathy:     Cervical: No cervical adenopathy.  Skin:    General: Skin is warm and dry.     Capillary Refill: Capillary refill takes less than 2 seconds.  Neurological:     General: No focal deficit present.     Mental Status: She is alert and oriented to person, place, and time. Mental status is at baseline.  Psychiatric:        Mood and Affect: Mood normal.        Behavior: Behavior normal.        Thought Content: Thought content normal.        Judgment: Judgment normal.

## 2022-10-14 ENCOUNTER — Encounter (INDEPENDENT_AMBULATORY_CARE_PROVIDER_SITE_OTHER): Payer: Self-pay | Admitting: Internal Medicine

## 2022-10-14 ENCOUNTER — Ambulatory Visit (INDEPENDENT_AMBULATORY_CARE_PROVIDER_SITE_OTHER): Payer: Medicare Other | Admitting: Internal Medicine

## 2022-10-14 VITALS — BP 109/69 | HR 72 | Temp 98.3°F | Ht 61.0 in | Wt 274.0 lb

## 2022-10-14 DIAGNOSIS — D751 Secondary polycythemia: Secondary | ICD-10-CM | POA: Diagnosis not present

## 2022-10-14 DIAGNOSIS — E669 Obesity, unspecified: Secondary | ICD-10-CM | POA: Diagnosis not present

## 2022-10-14 DIAGNOSIS — E559 Vitamin D deficiency, unspecified: Secondary | ICD-10-CM | POA: Diagnosis not present

## 2022-10-14 DIAGNOSIS — Z7984 Long term (current) use of oral hypoglycemic drugs: Secondary | ICD-10-CM | POA: Diagnosis not present

## 2022-10-14 DIAGNOSIS — E1169 Type 2 diabetes mellitus with other specified complication: Secondary | ICD-10-CM | POA: Diagnosis not present

## 2022-10-14 DIAGNOSIS — Z6841 Body Mass Index (BMI) 40.0 and over, adult: Secondary | ICD-10-CM

## 2022-10-14 NOTE — Assessment & Plan Note (Signed)
Her vitamin D levels have been in the 80s.  She has been instructed to switch to over-the-counter supplementation.  She will take 2000 international units daily.

## 2022-10-14 NOTE — Assessment & Plan Note (Signed)
Bjorn Loser has lost 56 pounds since since March 2022 which represents about 17% of her baseline body weight.  She has been really consistent and recently had a lapse due to family problems.  She feels that the 1500-calorie plan is too much food and based on her predicted basal metabolic rate of [id number] plan may be adequate for her.  She remains motivated to continue to lose weight.  She is not a candidate for incretin therapy as she had developed restrictive eating on these medications and was not having any weight loss at the time.

## 2022-10-14 NOTE — Assessment & Plan Note (Signed)
Patient is followed by endocrinology.  Her most recent A1c was 7.0.  She is currently on SGLT2 and metformin.  Metformin can be increased if tolerated.  She will continue current regimen and follow-up with endocrinology.

## 2022-10-14 NOTE — Assessment & Plan Note (Signed)
Her most recent CBC showed a hemoglobin of 16.6 and down from 19.6.  Recommend continued surveillance.  No symptoms of hyperviscosity.

## 2022-10-14 NOTE — Progress Notes (Signed)
Office: [phone]  /  Fax: [phone]  WEIGHT SUMMARY AND BIOMETRICS  Vitals Temp: 98.3 F (36.8 C) BP: 109/69 Pulse Rate: 72 SpO2: 95 %   Anthropometric Measurements Height:  (1.549 m) Weight: 274 lb (124.3 kg) BMI (Calculated): 51.8 Weight at Last Visit: 271lb Weight Lost Since Last Visit: 0 lb Weight Gained Since Last Visit: 3 lb Starting Weight: 288 lb Total Weight Loss (lbs): 12 lb (5.443 kg) Peak Weight: 367 lb   Body Composition  Body Fat %: 56.2 % Fat Mass (lbs): 154.4 lbs Muscle Mass (lbs): 114.4 lbs Visceral Fat Rating : 22    Starting Date: 03/11/22  Today's Visit #: 15  No data recorded  HPI  Chief Complaint: OBESITY  Autumn Massey is here to discuss her progress with her obesity treatment plan. She is on the the Category 3 Plan and states she is following her eating plan approximately 50 % of the time. She states she is exercising 10-15 minutes 2-3 times per week.  Interval History:  Since last office visit she has gained 3 pounds. She reports fair adherence to reduced calorie nutritional plan. Denies problems with appetite and hunger signals.  Denies problems with satiety and satiation.  Denies problems with eating patterns and portion control.  Denies abnormal cravings. Denies feeling deprived or restricted.   Barriers identified: lack of time for self-care, inability to focus on healthy eating, and multiple competing priorities.   Pharmacotherapy for weight loss: She had been on incretin therapy in the past and was discontinued because of restrictive eating pattern   ASSESSMENT AND PLAN  TREATMENT PLAN FOR OBESITY:  Recommended Dietary Goals  Autumn Massey is currently in the action stage of change. As such, her goal is to continue weight management plan. She has agreed to: switch to category 2 plan.  Her IC done in the past may be overestimating her daily caloric needs.  Her predicted is about 300 to 400 cal less  Behavioral  Intervention  We discussed the following Behavioral Modification Strategies today: increasing lean protein intake, increasing vegetables, increasing lower glycemic fruits, increasing fiber rich foods, avoiding skipping meals, increasing water intake, and work on meal planning and preparation.  Additional resources provided today:  Category 2 plan with supporting documents  Recommended Physical Activity Goals  Autumn Massey has been advised to work up to 150 minutes of moderate intensity aerobic activity a week and strengthening exercises 2-3 times per week for cardiovascular health, weight loss maintenance and preservation of muscle mass.   She has agreed to :  Think about ways to increase physical activity  Pharmacotherapy We discussed various medication options to help Autumn Massey with her weight loss efforts and we both agreed to : continue with nutritional and behavioral strategies  ASSOCIATED CONDITIONS ADDRESSED TODAY  Type 2 diabetes mellitus with obesity Assessment & Plan: Patient is followed by endocrinology.  Her most recent A1c was 7.0.  She is currently on SGLT2 and metformin.  Metformin can be increased if tolerated.  She will continue current regimen and follow-up with endocrinology.   Class 3 severe obesity with serious comorbidity and body mass index (BMI) of 50.0 to 59.9 in adult, unspecified obesity type Assessment & Plan: Autumn Massey has lost 56 pounds since since March 2022 which represents about 17% of her baseline body weight.  She has been really consistent and recently had a lapse due to family problems.  She feels that the 1500-calorie plan is too much food and based on her predicted basal metabolic rate  of 1200-calorie plan may be adequate for her.  She remains motivated to continue to lose weight.  She is not a candidate for incretin therapy as she had developed restrictive eating on these medications and was not having any weight loss at the time.   Polycythemia Assessment &  Plan: Her most recent CBC showed a hemoglobin of 16.6 and down from 19.6.  Recommend continued surveillance.  No symptoms of hyperviscosity.   Vitamin D deficiency Assessment & Plan: Her vitamin D levels have been in the 80s.  She has been instructed to switch to over-the-counter supplementation.  She will take 2000 international units daily.      PHYSICAL EXAM:  Blood pressure 109/69, pulse 72, temperature 98.3 F (36.8 C), height 5\' 1"  (1.549 m), weight 274 lb (124.3 kg), SpO2 95 %. Body mass index is 51.77 kg/m.  General: She is overweight, cooperative, alert, well developed, and in no acute distress. PSYCH: Has normal mood, affect and thought process.   HEENT: EOMI, sclerae are anicteric. Lungs: Normal breathing effort, no conversational dyspnea. Extremities: No edema.  Neurologic: No gross sensory or motor deficits. No tremors or fasciculations noted.    DIAGNOSTIC DATA REVIEWED:  BMET    Component Value Date/Time   NA 139 09/11/2022 0815   NA 137 03/11/2022 1554   K 4.2 09/11/2022 0815   CL 99 09/11/2022 0815   CO2 27 09/11/2022 0815   GLUCOSE 130 (H) 09/11/2022 0815   BUN 23 (H) 09/11/2022 0815   BUN 11 03/11/2022 1554   CREATININE 0.67 09/11/2022 0815   CREATININE 0.73 03/02/2018 1157   CALCIUM 9.4 09/11/2022 0815   GFRNONAA >60 09/11/2022 0815   GFRNONAA 95 03/02/2018 1157   GFRAA >60 04/26/2019 1445   GFRAA 110 03/02/2018 1157   Lab Results  Component Value Date   HGBA1C 7.0 (A) 05/02/2022   HGBA1C 5.6 03/15/2012   Lab Results  Component Value Date   INSULIN 7.5 03/11/2022   Lab Results  Component Value Date   TSH 3.270 03/11/2022   CBC    Component Value Date/Time   WBC 9.2 09/11/2022 0815   RBC 5.47 (H) 09/11/2022 0815   HGB 16.6 (H) 09/11/2022 0815   HGB 19.6 (H) 03/11/2022 1554   HCT 52.4 (H) 09/11/2022 0815   HCT 57.0 (H) 03/11/2022 1554   PLT 162 09/11/2022 0815   PLT 167 03/11/2022 1554   MCV 95.8 09/11/2022 0815   MCV 92  03/11/2022 1554   MCH 30.3 09/11/2022 0815   MCHC 31.7 09/11/2022 0815   RDW 13.9 09/11/2022 0815   RDW 12.1 03/11/2022 1554   Iron Studies    Component Value Date/Time   IRON 93 01/26/2019 0959   FERRITIN 124.5 01/29/2009 0905   IRONPCTSAT 31.3 01/26/2019 0959   Lipid Panel     Component Value Date/Time   CHOL 130 03/11/2022 1554   TRIG 268 (H) 03/11/2022 1554   HDL 34 (L) 03/11/2022 1554   CHOLHDL 5 07/10/2021 1638   VLDL 52.6 (H) 07/10/2021 1638   LDLCALC 54 03/11/2022 1554   LDLCALC 109 (H) 01/27/2020 1006   LDLDIRECT 92.0 07/10/2021 1638   Hepatic Function Panel     Component Value Date/Time   PROT 7.8 09/11/2022 0815   PROT 6.9 03/11/2022 1554   ALBUMIN 3.9 09/11/2022 0815   ALBUMIN 4.3 03/11/2022 1554   AST 26 09/11/2022 0815   ALT 32 09/11/2022 0815   ALKPHOS 69 09/11/2022 0815   BILITOT 0.6 09/11/2022 0815  BILITOT 0.7 03/11/2022 1554   BILIDIR 0.1 07/10/2021 1638   IBILI 0.4 01/27/2020 1006      Component Value Date/Time   TSH 3.270 03/11/2022 1554   Nutritional Lab Results  Component Value Date   VD25OH 83.22 09/11/2022   VD25OH 39.5 03/11/2022   VD25OH 35.27 07/10/2021     Return in about 2 weeks (around 10/28/2022) for For Weight Mangement with Dr. Rikki Spearing.[name] She was informed of the importance of frequent follow up visits to maximize her success with intensive lifestyle modifications for her multiple health conditions.   ATTESTASTION STATEMENTS:  Reviewed by clinician on day of visit: allergies, medications, problem list, medical history, surgical history, family history, social history, and previous encounter notes.     Worthy Rancher, MD

## 2022-10-19 DIAGNOSIS — J449 Chronic obstructive pulmonary disease, unspecified: Secondary | ICD-10-CM | POA: Diagnosis not present

## 2022-10-31 ENCOUNTER — Ambulatory Visit: Payer: Medicare Other | Admitting: Internal Medicine

## 2022-11-03 NOTE — Progress Notes (Unsigned)
Aleen Sells D.[facility] [address] [place] [address] Phone: [phone]   Assessment and Plan:     There are no diagnoses linked to this encounter.  ***   Pertinent previous records reviewed include ***   Follow Up: ***     Subjective:   I, Autumn Massey, am serving as a Neurosurgeon for Doctor Richardean Sale  Chief Complaint: right leg pain   HPI:  11/04/2022 Patient is a 57 year old female complaining of right leg pain. Patient states  Relevant Historical Information: ***  Additional pertinent review of systems negative.   Current Outpatient Medications:    Accu-Chek Softclix Lancets lancets, Use as instructed to check blood sugar once daily, Disp: 100 each, Rfl: 3   albuterol (PROVENTIL) (2.5 MG/3ML) 0.083% nebulizer solution, Take 3 mLs (2.5 mg total) by nebulization every 6 (six) hours as needed for wheezing or shortness of breath., Disp: 75 mL, Rfl: 3   albuterol (VENTOLIN HFA) 108 (90 Base) MCG/ACT inhaler, USE 2 INHALATIONS BY MOUTH  EVERY 6 HOURS AS NEEDED, Disp: 34 g, Rfl: 3   aspirin EC 81 MG tablet, Take 81 mg by mouth daily., Disp: , Rfl:    Azelastine-Fluticasone 137-50 MCG/ACT SUSP, Place 1 spray into the nose every 12 (twelve) hours., Disp: 23 g, Rfl: 0   bisoprolol-hydrochlorothiazide (ZIAC) 5-6.25 MG tablet, TAKE 1 TABLET BY MOUTH DAILY, Disp: 100 tablet, Rfl: 2   Blood Glucose Monitoring Suppl (ACCU-CHEK GUIDE) w/Device KIT, Use as instructed to check blood sugar 2X daily, Disp: 1 kit, Rfl: 0   BREO ELLIPTA 100-25 MCG/ACT AEPB, INHALE 1 INHALATION BY  MOUTH INTO THE LUNGS IN THE MORNING, Disp: 180 each, Rfl: 3   cholecalciferol (VITAMIN D3) 25 MCG (1000 UNIT) tablet, Take 1,000 Units by mouth daily., Disp: , Rfl:    clonazePAM (KLONOPIN) 0.5 MG tablet, Take 1 tablet (0.5 mg total) by mouth 2 (two) times daily as needed for anxiety., Disp: 60 tablet, Rfl: 5   cyclobenzaprine (FLEXERIL) 5 MG tablet, TAKE 1 TABLET  BY MOUTH 3  TIMES DAILY AS NEEDED FOR  MUSCLE SPASM(S), Disp: 270 tablet, Rfl: 1   Diclofenac Sodium (PENNSAID) 2 % SOLN, Place 2 g onto the skin 2 (two) times daily., Disp: 112 g, Rfl: 3   empagliflozin (JARDIANCE) 25 MG TABS tablet, TAKE 1 TABLET BY MOUTH DAILY  BEFORE BREAKFAST, Disp: 100 tablet, Rfl: 0   FLUoxetine (PROZAC) 40 MG capsule, TAKE 1 CAPSULE BY MOUTH DAILY, Disp: 100 capsule, Rfl: 3   furosemide (LASIX) 40 MG tablet, TAKE 1 TABLET BY MOUTH DAILY, Disp: 90 tablet, Rfl: 1   Gabapentin Enacarbil (HORIZANT) 600 MG TBCR, Take 1 tablet (600 mg total) by mouth at bedtime., Disp: 90 tablet, Rfl: 3   glucose blood (ACCU-CHEK GUIDE) test strip, Use as instructed to check blood sugar 2X daily, Disp: 200 each, Rfl: 3   HYDROcodone-acetaminophen (NORCO/VICODIN) 5-325 MG tablet, Take 1 tablet by mouth every 6 (six) hours as needed for moderate pain., Disp: , Rfl:    irbesartan (AVAPRO) 75 MG tablet, TAKE 1 TABLET BY MOUTH AT  BEDTIME, Disp: 100 tablet, Rfl: 2   Lancets Misc. (ACCU-CHEK FASTCLIX LANCET) KIT, Use to check blood sugar once a day, Disp: 1 kit, Rfl: 0   levocetirizine (XYZAL) 5 MG tablet, Take 1 tablet (5 mg total) by mouth every evening., Disp: 30 tablet, Rfl: 2   levothyroxine (SYNTHROID) 200 MCG tablet, TAKE 1 TABLET BY MOUTH DAILY, Disp:  100 tablet, Rfl: 2   levothyroxine (SYNTHROID) 25 MCG tablet, TAKE 1 TABLET BY MOUTH DAILY  BEFORE BREAKFAST TAKE WITH  TABLET FOR A TOTAL DAILY  DOSE OF ., Disp: 100 tablet, Rfl: 0   metFORMIN (GLUCOPHAGE-XR) 500 MG 24 hr tablet, TAKE 1 TABLET BY MOUTH  DAILY WITH BREAKFAST, Disp: 90 tablet, Rfl: 3   montelukast (SINGULAIR) 10 MG tablet, TAKE 1 TABLET BY MOUTH AT  BEDTIME, Disp: 100 tablet, Rfl: 3   Multiple Vitamin (MULTIVITAMIN WITH MINERALS) TABS tablet, Take 1 tablet by mouth daily., Disp: , Rfl:    nystatin cream (MYCOSTATIN), Apply 1 application. topically 2 (two) times daily., Disp: 30 g, Rfl: 2   ondansetron (ZOFRAN ODT) 4 MG  disintegrating tablet, Take 1 tablet (4 mg total) by mouth every 8 (eight) hours as needed for nausea or vomiting., Disp: 4 tablet, Rfl: 0   OXYGEN, 2lpm with sleep only  [facility], Disp: , Rfl:    primidone (MYSOLINE) 50 MG tablet, TAKE 1 TABLET BY MOUTH IN THE  MORNING AND 3 TABLETS BY MOUTH  AT BEDTIME, Disp: 400 tablet, Rfl: 2   rosuvastatin (CRESTOR) 40 MG tablet, Take 1 tablet (40 mg total) by mouth daily., Disp: 90 tablet, Rfl: 3   spironolactone (ALDACTONE) 25 MG tablet, TAKE 1 TABLET BY MOUTH TWICE  DAILY, Disp: 200 tablet, Rfl: 1   Objective:     There were no vitals filed for this visit.    There is no height or weight on file to calculate BMI.    Physical Exam:    ***   Electronically signed by:  Aleen Sells D[facility] 12:25 PM 11/03/22

## 2022-11-04 ENCOUNTER — Ambulatory Visit (INDEPENDENT_AMBULATORY_CARE_PROVIDER_SITE_OTHER): Payer: Medicare Other | Admitting: Sports Medicine

## 2022-11-04 VITALS — HR 52 | Ht 61.0 in | Wt 280.0 lb

## 2022-11-04 DIAGNOSIS — M25551 Pain in right hip: Secondary | ICD-10-CM | POA: Diagnosis not present

## 2022-11-04 MED ORDER — MELOXICAM 15 MG PO TABS: 15.0000 mg | ORAL_TABLET | Freq: Every day | ORAL | 0 refills | Status: DC

## 2022-11-04 NOTE — Patient Instructions (Addendum)
Good to see you - Start meloxicam 15 mg daily x2 weeks.  If still having pain after 2 weeks, complete 3rd-week of meloxicam. May use remaining meloxicam as needed once daily for pain control.  Do not to use additional NSAIDs while taking meloxicam.  May use Tylenol 500-1000 mg 2 to 3 times a day for breakthrough pain. Hip HEP  3 week follow up 

## 2022-11-05 ENCOUNTER — Encounter: Payer: Self-pay | Admitting: Neurology

## 2022-11-05 NOTE — Progress Notes (Signed)
Virtual Visit via Video Note  Consent was obtained for video visit:  Yes.   Answered questions that patient had about telehealth interaction:  Yes.   I discussed the limitations, risks, security and privacy concerns of performing an evaluation and management service by telemedicine. I also discussed with the patient that there may be a patient responsible charge related to this service. The patient expressed understanding and agreed to proceed.  Pt location: Home Physician Location: office Name of referring provider:  Corwin Levins, MD I connected with Maurilio Lovely at patients initiation/request on 11/07/2022 at  2:30 PM EDT by video enabled telemedicine application and verified that I am speaking with the correct person using two identifiers. 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    Subjective:  Autumn Massey is a 57 year old right-handed woman with hypothyroidism, hyperlipidemia, diabetes, hypertension, hypoventilation, cerebrovascular disease and former smoker who follows up for essential tremor.   UPDATE: Current medication: primidone 50mg  in AM and 150mg  at bedtime.  Other medications include Horizant 600mg  at bedtime for neuropathic pain.   Tremors are stable.  Neuropathic pain in the feet are uncomfortable.     Feeling better.   HISTORY:  She underwent left ulnar decompression surgery in August 2014.  When the cast was removed the following month, she noted twitching of her fifth digit.  An MRI of the cervical spine was performed in December 2014, which revealed no structural  etiology for radiculopathy.  She had a NCV-EMG in January 2015 which was essentially normal.  She continued to have numbness, paresthesias and weakness of the fourth and fifth digits of the left hand.  She later developed tremor in both hands, more noticeable in the right hand since she is right-handed dominant.  It occurs when she is writing or holding utensils.  She denies gait difficulty, rigidity, lack of sense of smell, or history consistent with REM sleep behavior.  She is concerned about Parkinson's disease because her maternal grandfather and uncle had it.  Another uncle had tremor.   She has numbness and tingling in the toes and fingers.  She also notes electric pain in the right leg below the knee to the foot.  Continued to have numbness and pain despite ulnar decompression. In October, she started having weakness in the hands.  MRI of cervical spine on 04/26/2021 showed large disc extrusion causing severe spinal stenosis impinging the spinal cord at the C5-7 level.  Underwent C6 corpectomy and arthrodesis from C5-7 in December 2022.  For neuropathic pain, she has tried gabapentin, Cymbalta, and Lyrica.  Past Medical History: Past Medical History:  Diagnosis Date   Anxiety    ANXIETY DEPRESSION 01/29/2009   04/26/2019- no current   Arthritis    Asthma    CHF (congestive heart failure) (HCC)    DEPRESSION 11/19/2007   Diabetes (HCC)    Dyspnea    FREQUENCY, URINARY 10/17/2009   HOH (hard of hearing)    HYPERLIPIDEMIA 11/19/2007   HYPERTENSION 11/19/2007   HYPOTHYROIDISM 11/19/2007   Morbid obesity (HCC)    Neuropathy 01/26/2019   Neuropathy    PNEUMONIA 08/06/2010  Pneumonia    POLYCYTHEMIA 01/29/2009   Preventative health care 04/06/2011   Sleep apnea    mild sleep apnea, on o2 at 2l at nighttime   SLEEP RELATED HYPOVENTILATION/HYPOXEMIA CCE 02/08/2009   Venous insufficiency 07/19/2012    Medications: Outpatient Encounter Medications as of 11/07/2022  Medication Sig Note    Accu-Chek Softclix Lancets lancets Use as instructed to check blood sugar once daily    albuterol (PROVENTIL) (2.5 MG/3ML) 0.083% nebulizer solution Take 3 mLs (2.5 mg total) by nebulization every 6 (six) hours as needed for wheezing or shortness of breath.    albuterol (VENTOLIN HFA) 108 (90 Base) MCG/ACT inhaler USE 2 INHALATIONS BY MOUTH  EVERY 6 HOURS AS NEEDED    aspirin EC 81 MG tablet Take 81 mg by mouth daily.    Azelastine-Fluticasone 137-50 MCG/ACT SUSP Place 1 spray into the nose every 12 (twelve) hours.    bisoprolol-hydrochlorothiazide (ZIAC) 5-6.25 MG tablet TAKE 1 TABLET BY MOUTH DAILY    Blood Glucose Monitoring Suppl (ACCU-CHEK GUIDE) w/Device KIT Use as instructed to check blood sugar 2X daily    BREO ELLIPTA 100-25 MCG/ACT AEPB INHALE 1 INHALATION BY  MOUTH INTO THE LUNGS IN THE MORNING    cholecalciferol (VITAMIN D3) 25 MCG (1000 UNIT) tablet Take 1,000 Units by mouth daily.    clonazePAM (KLONOPIN) 0.5 MG tablet Take 1 tablet (0.5 mg total) by mouth 2 (two) times daily as needed for anxiety.    cyclobenzaprine (FLEXERIL) 5 MG tablet TAKE 1 TABLET BY MOUTH 3  TIMES DAILY AS NEEDED FOR  MUSCLE SPASM(S)    Diclofenac Sodium (PENNSAID) 2 % SOLN Place 2 g onto the skin 2 (two) times daily.    empagliflozin (JARDIANCE) 25 MG TABS tablet TAKE 1 TABLET BY MOUTH DAILY  BEFORE BREAKFAST    FLUoxetine (PROZAC) 40 MG capsule TAKE 1 CAPSULE BY MOUTH DAILY    furosemide (LASIX) 40 MG tablet TAKE 1 TABLET BY MOUTH DAILY 10/06/2022: PRN    Gabapentin Enacarbil (HORIZANT) 600 MG TBCR Take 1 tablet (600 mg total) by mouth at bedtime.    glucose blood (ACCU-CHEK GUIDE) test strip Use as instructed to check blood sugar 2X daily    HYDROcodone-acetaminophen (NORCO/VICODIN) 5-325 MG tablet Take 1 tablet by mouth every 6 (six) hours as needed for moderate pain.    irbesartan (AVAPRO) 75 MG tablet TAKE 1 TABLET BY MOUTH AT  BEDTIME    Lancets Misc. (ACCU-CHEK FASTCLIX LANCET) KIT Use to check blood  sugar once a day    levocetirizine (XYZAL) 5 MG tablet Take 1 tablet (5 mg total) by mouth every evening.    levothyroxine (SYNTHROID) 200 MCG tablet TAKE 1 TABLET BY MOUTH DAILY    levothyroxine (SYNTHROID) 25 MCG tablet TAKE 1 TABLET BY MOUTH DAILY  BEFORE BREAKFAST TAKE WITH  TABLET FOR A TOTAL DAILY  DOSE OF .    meloxicam (MOBIC) 15 MG tablet Take 1 tablet (15 mg total) by mouth daily.    metFORMIN (GLUCOPHAGE-XR) 500 MG 24 hr tablet TAKE 1 TABLET BY MOUTH  DAILY WITH BREAKFAST    montelukast (SINGULAIR) 10 MG tablet TAKE 1 TABLET BY MOUTH AT  BEDTIME    Multiple Vitamin (MULTIVITAMIN WITH MINERALS) TABS tablet Take 1 tablet by mouth daily.    nystatin cream (MYCOSTATIN) Apply 1 application. topically 2 (two) times daily.    ondansetron (ZOFRAN ODT) 4 MG disintegrating tablet Take 1 tablet (4 mg total) by mouth every 8 (eight) hours as needed for  nausea or vomiting.    OXYGEN 2lpm with sleep only  [facility]    primidone (MYSOLINE) 50 MG tablet TAKE 1 TABLET BY MOUTH IN THE  MORNING AND 3 TABLETS BY MOUTH  AT BEDTIME 10/06/2022: taking only the 3  at night    rosuvastatin (CRESTOR) 40 MG tablet Take 1 tablet (40 mg total) by mouth daily.    spironolactone (ALDACTONE) 25 MG tablet TAKE 1 TABLET BY MOUTH TWICE  DAILY    No facility-administered encounter medications on file as of 11/07/2022.    Allergies: Allergies  Allergen Reactions   Cymbalta [Duloxetine Hcl]     Altered mental state / agitated     Family History: Family History  Problem Relation Age of Onset   Cancer Mother        melanoma    Heart disease Mother    High blood pressure Mother    Stroke Mother    Thyroid disease Mother    Anxiety disorder Mother    High Cholesterol Sister    Psoriasis Brother    Osteoarthritis Maternal Grandmother 54   Scoliosis Maternal Grandmother    Cancer Maternal Grandfather        prostate cancer   Alcohol abuse Other    Arthritis Other    Hypertension Other      Observations/Objective:   No acute distress.  Alert and oriented.  Speech fluent and not dysarthric.  Language intact.     Follow Up Instructions:    -I discussed the assessment and treatment plan with the patient. The patient was provided an opportunity to ask questions and all were answered. The patient agreed with the plan and demonstrated an understanding of the instructions.   The patient was advised to call back or seek an in-person evaluation if the symptoms worsen or if the condition fails to improve as anticipated.   Cira Servant, DO

## 2022-11-06 NOTE — Progress Notes (Signed)
TeleHealth Visit:  This visit was completed with telemedicine (audio/video) technology. Autumn Massey has verbally consented to this TeleHealth visit. The patient is located at home, the provider is located at home. The participants in this visit include the listed provider and patient. The visit was conducted today via MyChart video.  OBESITY Autumn Massey is here to discuss her progress with her obesity treatment plan along with follow-up of her obesity related diagnoses.   Today's visit was # 16 Starting weight: 288 lbs Starting date: 03/11/22 Weight at last in office visit: 274 lbs on 10/14/22 Total weight loss: 12 lbs at last in office visit on 10/14/22. Today's reported weight (11/10/22): none reported  Nutrition Plan: the Category 2 plan - 80% adherence.  Current exercise:  2 lb hand weights for 15-30 minutes a few times per week, resistance bands sporadically.  Interim History:  Her mom has been sick with lung cancer and is currently in the hospital.  This has presented a challenge to stick to the plan but she is doing the best she can. She is doing some meal prepping and taking food to the hospital.  Denies hunger and cravings. She tends to skip meals -esp lunch. Skips lunch 3-4 days per week. Keeps snack calories < 200 per day. It was hard to adjust at first because she had been on cat 3 before last OV. She is a Conservation officer, nature at [employer]. She does not feel especially tempted by the foods at the restaurant.  She takes healthy food to work. Her goal is to be in the 260s by her next visit with Dr. Rikki Spearing.  Has been on GLP-1 (Ozempic) in the past.  It was discontinued due to elevated lipase and GI symptoms per Dr. Charlean Sanfilippo note on 07/19/2021.  Eating all of the prescribed protein: no Skipping meals: Yes Drinking adequate water: Yes Drinking sugar sweetened beverages: No Hunger controlled: well controlled. Cravings controlled:  well controlled.  Assessment/Plan:  1. Type 2  Diabetes Mellitus with circulatory complications, without long-term current use of insulin HgbA1c is not at goal. Last A1c was 7.0 on 05/02/22.  A1c had been 8.4 on 10/21/2021.  Last office visit with Dr. Lafe Garin was 05/02/22.  She needs an updated A1c. CBGs: Fasting 89-114. After meals- <140. Episodes of hypoglycemia: no Medication(s): Jardiance 25 mg daily, metformin XR 500 mg daily with breakfast  Lab Results  Component Value Date   HGBA1C 7.0 (A) 05/02/2022   HGBA1C 7.6 (A) 01/24/2022   HGBA1C 8.4 (A) 10/21/2021   Lab Results  Component Value Date   MICROALBUR 3.4 (H) 07/10/2021   LDLCALC 54 03/11/2022   CREATININE 0.67 09/11/2022   Lab Results  Component Value Date   GFR 99.38 07/10/2021   GFR 96.13 10/03/2020   GFR 80.80 01/26/2019    Plan: Continue Jardiance 25 mg and metformin XR 500 mg daily with breakfast. Check A1c, CMet, urine microalbumin. She will go into the office fasting before next office visit to have labs drawn.  2. Vitamin D Deficiency Vitamin D is at goal of 50.  Most recent vitamin D level was 83 on 09/11/2022.Autumn Massey She is on OTC vitamin D3 2000 IU daily. Lab Results  Component Value Date   VD25OH 83.22 09/11/2022   VD25OH 39.5 03/11/2022   VD25OH 35.27 07/10/2021    Plan: Continue OTC vitamin D3 2000 IU daily Check vitamin D level.  3. Hyperlipidemia associated with type 2 diabetes LDL is not at goal.  Last LDL was 54 on 07/10/2021.  HDL low at 34.  Triglycerides elevated at 268. Has COPD, hypertension, and congestive heart failure. Medication(s): Crestor 40 mg daily.   Lab Results  Component Value Date   CHOL 130 03/11/2022   HDL 34 (L) 03/11/2022   LDLCALC 54 03/11/2022   LDLDIRECT 92.0 07/10/2021   TRIG 268 (H) 03/11/2022   CHOLHDL 5 07/10/2021   CHOLHDL 5 10/03/2020   CHOLHDL 3.8 01/27/2020   Lab Results  Component Value Date   ALT 32 09/11/2022   AST 26 09/11/2022   ALKPHOS 69 09/11/2022   BILITOT 0.6 09/11/2022   The ASCVD Risk  score (Arnett DK, et al., 2019) failed to calculate for the following reasons:   The systolic blood pressure is missing  Plan: Continue Crestor 40 mg daily. Check fasting lipid profile.     4. Morbid Obesity: Current BMI 51 Arlone is currently in the action stage of change. As such, her goal is to continue with weight loss efforts.  She has agreed to the Category 2 plan.  1.  Discussed effects of meal skipping on metabolism.  Exercise goals: as is  Behavioral modification strategies: increasing lean protein intake, no meal skipping, and planning for success.  Analice has agreed to follow-up with our clinic in 4 weeks.   Orders Placed This Encounter  Procedures   Hemoglobin A1c   Lipid Panel With LDL/HDL Ratio   VITAMIN D 25 Hydroxy (Vit-D Deficiency, Fractures)    There are no discontinued medications.   No orders of the defined types were placed in this encounter.     Objective:   VITALS: Per patient if applicable, see vitals. GENERAL: Alert and in no acute distress. CARDIOPULMONARY: No increased WOB. Speaking in clear sentences.  PSYCH: Pleasant and cooperative. Speech normal rate and rhythm. Affect is appropriate. Insight and judgement are appropriate. Attention is focused, linear, and appropriate.  NEURO: Oriented as arrived to appointment on time with no prompting.   Attestation Statements:   Reviewed by clinician on day of visit: allergies, medications, problem list, medical history, surgical history, family history, social history, and previous encounter notes.   This was prepared with the assistance of Engineer, civil (consulting).  Occasional wrong-word or sound-a-like substitutions may have occurred due to the inherent limitations of voice recognition software.

## 2022-11-07 ENCOUNTER — Telehealth: Payer: Medicare Other | Admitting: Neurology

## 2022-11-07 ENCOUNTER — Encounter: Payer: Self-pay | Admitting: Neurology

## 2022-11-07 DIAGNOSIS — G250 Essential tremor: Secondary | ICD-10-CM | POA: Diagnosis not present

## 2022-11-07 DIAGNOSIS — E1142 Type 2 diabetes mellitus with diabetic polyneuropathy: Secondary | ICD-10-CM

## 2022-11-07 MED ORDER — HORIZANT 600 MG PO TBCR: 600.0000 mg | EXTENDED_RELEASE_TABLET | Freq: Every day | ORAL | 3 refills | Status: DC

## 2022-11-07 MED ORDER — PRIMIDONE 50 MG PO TABS: ORAL_TABLET | ORAL | 3 refills | Status: DC

## 2022-11-10 ENCOUNTER — Telehealth (INDEPENDENT_AMBULATORY_CARE_PROVIDER_SITE_OTHER): Payer: Medicare Other | Admitting: Family Medicine

## 2022-11-10 ENCOUNTER — Encounter (INDEPENDENT_AMBULATORY_CARE_PROVIDER_SITE_OTHER): Payer: Self-pay | Admitting: Family Medicine

## 2022-11-10 ENCOUNTER — Ambulatory Visit (INDEPENDENT_AMBULATORY_CARE_PROVIDER_SITE_OTHER): Payer: Medicare Other | Admitting: Internal Medicine

## 2022-11-10 DIAGNOSIS — Z7984 Long term (current) use of oral hypoglycemic drugs: Secondary | ICD-10-CM | POA: Diagnosis not present

## 2022-11-10 DIAGNOSIS — E1159 Type 2 diabetes mellitus with other circulatory complications: Secondary | ICD-10-CM | POA: Diagnosis not present

## 2022-11-10 DIAGNOSIS — E785 Hyperlipidemia, unspecified: Secondary | ICD-10-CM

## 2022-11-10 DIAGNOSIS — E1169 Type 2 diabetes mellitus with other specified complication: Secondary | ICD-10-CM

## 2022-11-10 DIAGNOSIS — E559 Vitamin D deficiency, unspecified: Secondary | ICD-10-CM | POA: Diagnosis not present

## 2022-11-10 DIAGNOSIS — Z6841 Body Mass Index (BMI) 40.0 and over, adult: Secondary | ICD-10-CM

## 2022-11-18 DIAGNOSIS — J449 Chronic obstructive pulmonary disease, unspecified: Secondary | ICD-10-CM | POA: Diagnosis not present

## 2022-12-08 ENCOUNTER — Ambulatory Visit (INDEPENDENT_AMBULATORY_CARE_PROVIDER_SITE_OTHER): Payer: Medicare Other | Admitting: Internal Medicine

## 2022-12-19 DIAGNOSIS — J449 Chronic obstructive pulmonary disease, unspecified: Secondary | ICD-10-CM | POA: Diagnosis not present

## 2022-12-23 DIAGNOSIS — M50222 Other cervical disc displacement at C5-C6 level: Secondary | ICD-10-CM | POA: Diagnosis not present

## 2022-12-25 ENCOUNTER — Other Ambulatory Visit: Payer: Self-pay | Admitting: Internal Medicine

## 2022-12-25 ENCOUNTER — Other Ambulatory Visit: Payer: Self-pay

## 2022-12-25 DIAGNOSIS — E039 Hypothyroidism, unspecified: Secondary | ICD-10-CM

## 2022-12-25 DIAGNOSIS — E1165 Type 2 diabetes mellitus with hyperglycemia: Secondary | ICD-10-CM

## 2022-12-29 ENCOUNTER — Ambulatory Visit (INDEPENDENT_AMBULATORY_CARE_PROVIDER_SITE_OTHER): Payer: Medicare Other | Admitting: Internal Medicine

## 2023-01-01 ENCOUNTER — Other Ambulatory Visit: Payer: Self-pay | Admitting: Family Medicine

## 2023-01-01 DIAGNOSIS — J302 Other seasonal allergic rhinitis: Secondary | ICD-10-CM

## 2023-01-05 DIAGNOSIS — H05011 Cellulitis of right orbit: Secondary | ICD-10-CM | POA: Diagnosis not present

## 2023-01-13 ENCOUNTER — Ambulatory Visit: Payer: Medicare Other | Admitting: Internal Medicine

## 2023-01-18 DIAGNOSIS — J449 Chronic obstructive pulmonary disease, unspecified: Secondary | ICD-10-CM | POA: Diagnosis not present

## 2023-01-26 ENCOUNTER — Ambulatory Visit (INDEPENDENT_AMBULATORY_CARE_PROVIDER_SITE_OTHER): Payer: Medicare Other | Admitting: Internal Medicine

## 2023-01-26 VITALS — BP 130/78 | HR 75 | Temp 98.6°F | Ht 61.0 in | Wt 280.0 lb

## 2023-01-26 DIAGNOSIS — E7849 Other hyperlipidemia: Secondary | ICD-10-CM | POA: Diagnosis not present

## 2023-01-26 DIAGNOSIS — E1165 Type 2 diabetes mellitus with hyperglycemia: Secondary | ICD-10-CM | POA: Diagnosis not present

## 2023-01-26 DIAGNOSIS — E039 Hypothyroidism, unspecified: Secondary | ICD-10-CM | POA: Diagnosis not present

## 2023-01-26 DIAGNOSIS — I10 Essential (primary) hypertension: Secondary | ICD-10-CM | POA: Diagnosis not present

## 2023-01-26 DIAGNOSIS — E538 Deficiency of other specified B group vitamins: Secondary | ICD-10-CM

## 2023-01-26 DIAGNOSIS — R9431 Abnormal electrocardiogram [ECG] [EKG]: Secondary | ICD-10-CM | POA: Diagnosis not present

## 2023-01-26 DIAGNOSIS — E559 Vitamin D deficiency, unspecified: Secondary | ICD-10-CM

## 2023-01-26 DIAGNOSIS — F4321 Adjustment disorder with depressed mood: Secondary | ICD-10-CM

## 2023-01-26 DIAGNOSIS — Z7984 Long term (current) use of oral hypoglycemic drugs: Secondary | ICD-10-CM

## 2023-01-26 LAB — CBC WITH DIFFERENTIAL/PLATELET
Basophils Absolute: 0.1 10*3/uL (ref 0.0–0.1)
Basophils Relative: 0.6 % (ref 0.0–3.0)
Eosinophils Absolute: 0.1 10*3/uL (ref 0.0–0.7)
Eosinophils Relative: 1.3 % (ref 0.0–5.0)
HCT: 52.7 % — ABNORMAL HIGH (ref 36.0–46.0)
Hemoglobin: 17.2 g/dL — ABNORMAL HIGH (ref 12.0–15.0)
Lymphocytes Relative: 17 % (ref 12.0–46.0)
Lymphs Abs: 1.6 10*3/uL (ref 0.7–4.0)
MCHC: 32.7 g/dL (ref 30.0–36.0)
MCV: 95.3 fl (ref 78.0–100.0)
Monocytes Absolute: 0.8 10*3/uL (ref 0.1–1.0)
Monocytes Relative: 8.2 % (ref 3.0–12.0)
Neutro Abs: 7 10*3/uL (ref 1.4–7.7)
Neutrophils Relative %: 72.9 % (ref 43.0–77.0)
Platelets: 167 10*3/uL (ref 150.0–400.0)
RBC: 5.52 Mil/uL — ABNORMAL HIGH (ref 3.87–5.11)
RDW: 13.9 % (ref 11.5–15.5)
WBC: 9.6 10*3/uL (ref 4.0–10.5)

## 2023-01-26 LAB — BASIC METABOLIC PANEL
BUN: 24 mg/dL — ABNORMAL HIGH (ref 6–23)
CO2: 28 mEq/L (ref 19–32)
Calcium: 10.3 mg/dL (ref 8.4–10.5)
Chloride: 97 mEq/L (ref 96–112)
Creatinine, Ser: 0.56 mg/dL (ref 0.40–1.20)
GFR: 101.52 mL/min (ref >60.00)
Glucose, Bld: 100 mg/dL — ABNORMAL HIGH (ref 70–99)
Potassium: 4 mEq/L (ref 3.5–5.1)
Sodium: 135 mEq/L (ref 135–145)

## 2023-01-26 LAB — HEPATIC FUNCTION PANEL
ALT: 23 U/L (ref 0–35)
AST: 19 U/L (ref 0–37)
Albumin: 4.1 g/dL (ref 3.5–5.2)
Alkaline Phosphatase: 64 U/L (ref 39–117)
Bilirubin, Direct: 0.1 mg/dL (ref 0.0–0.3)
Total Bilirubin: 0.7 mg/dL (ref 0.2–1.2)
Total Protein: 7.5 g/dL (ref 6.0–8.3)

## 2023-01-26 LAB — HEMOGLOBIN A1C: Hgb A1c MFr Bld: 7 % — ABNORMAL HIGH (ref 4.6–6.5)

## 2023-01-26 LAB — VITAMIN D 25 HYDROXY (VIT D DEFICIENCY, FRACTURES): VITD: 47.22 ng/mL (ref 30.00–100.00)

## 2023-01-26 LAB — MICROALBUMIN / CREATININE URINE RATIO
Creatinine,U: 51.8 mg/dL
Microalb Creat Ratio: 5 mg/g (ref 0.0–30.0)
Microalb, Ur: 2.6 mg/dL — ABNORMAL HIGH (ref 0.0–1.9)

## 2023-01-26 LAB — LIPID PANEL
Cholesterol: 156 mg/dL (ref 0–200)
HDL: 38.5 mg/dL — ABNORMAL LOW (ref >39.00)
NonHDL: 117.53
Total CHOL/HDL Ratio: 4
Triglycerides: 281 mg/dL — ABNORMAL HIGH (ref 0.0–149.0)
VLDL: 56.2 mg/dL — ABNORMAL HIGH (ref 0.0–40.0)

## 2023-01-26 LAB — LDL CHOLESTEROL, DIRECT: Direct LDL: 79 mg/dL

## 2023-01-26 LAB — VITAMIN B12: Vitamin B-12: 682 pg/mL (ref 211–911)

## 2023-01-26 NOTE — Assessment & Plan Note (Signed)
Recent worsening situationally, declines referral for counseling

## 2023-01-26 NOTE — Assessment & Plan Note (Addendum)
Lab Results  Component Value Date   HGBA1C 7.0 (H) 01/26/2023   uncontrolled, pt to continue current medical treatment jardiance 25 every day, metformin ER 500 mg - 1 every day as declines change

## 2023-01-26 NOTE — Assessment & Plan Note (Addendum)
Lab Results  Component Value Date   LDLCALC 54 03/11/2022   Stable, pt to continue current statin crestor 40 every day, also for cardiac CT score

## 2023-01-26 NOTE — Patient Instructions (Signed)
Please continue all other medications as before, and refills have been done if requested.  Please have the pharmacy call with any other refills you may need.  Please continue your efforts at being more active, low cholesterol diet, and weight control.  You are otherwise up to date with prevention measures today.  Please keep your appointments with your specialists as you may have planned - endo in August  You will be contacted regarding the referral for: CT cardiac score  Please go to the LAB at the blood drawing area for the tests to be done  You will be contacted by phone if any changes need to be made immediately.  Otherwise, you will receive a letter about your results with an explanation, but please check with MyChart first.  Please remember to sign up for MyChart if you have not done so, as this will be important to you in the future with finding out test results, communicating by private email, and scheduling acute appointments online when needed.  Please make an Appointment to return in 6 months, or sooner if needed

## 2023-01-26 NOTE — Progress Notes (Signed)
Patient ID: Autumn Massey, female   DOB: [birth year], 57 y.o.   MRN: [id number]        Chief Complaint: follow up low thyroid, HLD and hyperglycemia , low vit d, htn       HPI:  Autumn Massey is a 57 y.o. female here overall doing ok but Mother passed recently and missed last endo f/u as pt was in [place].  Mother lived with her, so she is now adjusting to living alone after 17 yrs.  Has appt aug 2024 with endo.  Pt denies chest pain, increased sob or doe, wheezing, orthopnea, PND, increased LE swelling, palpitations, dizziness or syncope.   Pt denies polydipsia, polyuria, or new focal neuro s/s.  Lost overll 60 lbs so far with wt and wellness, has been holding off on taking the ozempic.   Wt Readings from Last 3 Encounters:  01/26/23 280 lb (127 kg)  11/04/22 280 lb (127 kg)  10/14/22 274 lb (124.3 kg)   BP Readings from Last 3 Encounters:  01/26/23 130/78  10/14/22 109/69  10/06/22 118/60         Past Medical History:  Diagnosis Date   Anxiety    ANXIETY DEPRESSION 01/29/2009   04/26/2019- no current   Arthritis    Asthma    CHF (congestive heart failure) (HCC)    DEPRESSION 11/19/2007   Diabetes (HCC)    Dyspnea    FREQUENCY, URINARY 10/17/2009   HOH (hard of hearing)    HYPERLIPIDEMIA 11/19/2007   HYPERTENSION 11/19/2007   HYPOTHYROIDISM 11/19/2007   Morbid obesity (HCC)    Neuropathy 01/26/2019   Neuropathy    PNEUMONIA 08/06/2010   Pneumonia    POLYCYTHEMIA 01/29/2009   Preventative health care 04/06/2011   Sleep apnea    mild sleep apnea, on o2 at 2l at nighttime   SLEEP RELATED HYPOVENTILATION/HYPOXEMIA CCE 02/08/2009   Venous insufficiency 07/19/2012   Past Surgical History:  Procedure Laterality Date   ANTERIOR CERVICAL CORPECTOMY N/A 06/19/2021   Procedure: Cerivcal Six Corpectomy;  Surgeon: Coletta Memos, MD;  Location: [facility] OR;  Service: Neurosurgery;  Laterality: N/A;   DILITATION & CURRETTAGE/HYSTROSCOPY WITH NOVASURE ABLATION N/A 04/28/2019    Procedure: DILATATION & CURETTAGE/HYSTEROSCOPY WITH NOVASURE ABLATION;  Surgeon: Richarda Overlie, MD;  Location: [facility] OR;  Service: Gynecology;  Laterality: N/A;  [employer] rep will be here confirmed on 04/20/19   TONSILLECTOMY     ULNAR TUNNEL RELEASE Left 02/22/2013   Procedure: LEFT ULNAR NERVE DECOMPRESSION ;  Surgeon: Tami Ribas, MD;  Location: [facility];  Service: Orthopedics;  Laterality: Left;    reports that she has been smoking cigarettes. She has a 11 pack-year smoking history. She has never used smokeless tobacco. She reports that she does not currently use alcohol. She reports that she does not use drugs. family history includes Alcohol abuse in an other family member; Anxiety disorder in her mother; Arthritis in an other family member; Cancer in her maternal grandfather and mother; Heart disease in her mother; High Cholesterol in her sister; High blood pressure in her mother; Hypertension in an other family member; Osteoarthritis (age of onset: 78) in her maternal grandmother; Psoriasis in her brother; Scoliosis in her maternal grandmother; Stroke in her mother; Thyroid disease in her mother. Allergies  Allergen Reactions   Cymbalta [Duloxetine Hcl]     Altered mental state / agitated    Current Outpatient Medications on File Prior to Visit  Medication Sig Dispense Refill   Accu-Chek  Softclix Lancets lancets USE AS INSTRUCTED TO CHECK BLOOD SUGAR ONCE DAILY 100 each 2   albuterol (PROVENTIL) (2.5 MG/3ML) 0.083% nebulizer solution Take 3 mLs (2.5 mg total) by nebulization every 6 (six) hours as needed for wheezing or shortness of breath. 75 mL 3   albuterol (VENTOLIN HFA) 108 (90 Base) MCG/ACT inhaler USE 2 INHALATIONS BY MOUTH  EVERY 6 HOURS AS NEEDED 34 g 3   aspirin EC 81 MG tablet Take 81 mg by mouth daily.     Azelastine-Fluticasone 137-50 MCG/ACT SUSP Place 1 spray into the nose every 12 (twelve) hours. 23 g 0   bisoprolol-hydrochlorothiazide (ZIAC) 5-6.25 MG  tablet TAKE 1 TABLET BY MOUTH DAILY 100 tablet 2   Blood Glucose Monitoring Suppl (ACCU-CHEK GUIDE) w/Device KIT Use as instructed to check blood sugar 2X daily 1 kit 0   BREO ELLIPTA 100-25 MCG/ACT AEPB USE 1 INHALATION BY MOUTH ONCE  DAILY AT THE SAME TIME EACH DAY 180 each 3   cholecalciferol (VITAMIN D3) 25 MCG (1000 UNIT) tablet Take 1,000 Units by mouth daily.     clonazePAM (KLONOPIN) 0.5 MG tablet Take 1 tablet (0.5 mg total) by mouth 2 (two) times daily as needed for anxiety. 60 tablet 5   cyclobenzaprine (FLEXERIL) 5 MG tablet TAKE 1 TABLET BY MOUTH 3  TIMES DAILY AS NEEDED FOR  MUSCLE SPASM(S) 270 tablet 1   Diclofenac Sodium (PENNSAID) 2 % SOLN Place 2 g onto the skin 2 (two) times daily. 112 g 3   FLUoxetine (PROZAC) 40 MG capsule TAKE 1 CAPSULE BY MOUTH DAILY 100 capsule 3   furosemide (LASIX) 40 MG tablet TAKE 1 TABLET BY MOUTH DAILY 90 tablet 1   Gabapentin Enacarbil (HORIZANT) 600 MG TBCR Take 1 tablet (600 mg total) by mouth at bedtime. 90 tablet 3   glucose blood (ACCU-CHEK GUIDE) test strip Use as instructed to check blood sugar 2X daily 200 each 3   HYDROcodone-acetaminophen (NORCO/VICODIN) 5-325 MG tablet Take 1 tablet by mouth every 6 (six) hours as needed for moderate pain.     irbesartan (AVAPRO) 75 MG tablet TAKE 1 TABLET BY MOUTH AT  BEDTIME 100 tablet 2   JARDIANCE 25 MG TABS tablet TAKE 1 TABLET BY MOUTH DAILY  BEFORE BREAKFAST 100 tablet 2   Lancets Misc. (ACCU-CHEK FASTCLIX LANCET) KIT Use to check blood sugar once a day 1 kit 0   levocetirizine (XYZAL) 5 MG tablet Take 1 tablet (5 mg total) by mouth every evening. 30 tablet 2   levothyroxine (SYNTHROID) 200 MCG tablet TAKE 1 TABLET BY MOUTH DAILY 100 tablet 2   levothyroxine (SYNTHROID) 25 MCG tablet TAKE 1 TABLET BY MOUTH DAILY  BEFORE BREAKFAST WITH 200 MCG  TABLET FOR A TOTAL DAILY DOSE OF 225 MCG 100 tablet 2   meloxicam (MOBIC) 15 MG tablet Take 1 tablet (15 mg total) by mouth daily. 30 tablet 0   metFORMIN  (GLUCOPHAGE-XR) 500 MG 24 hr tablet TAKE 1 TABLET BY MOUTH DAILY  WITH BREAKFAST 100 tablet 2   montelukast (SINGULAIR) 10 MG tablet TAKE 1 TABLET BY MOUTH AT  BEDTIME 100 tablet 3   Multiple Vitamin (MULTIVITAMIN WITH MINERALS) TABS tablet Take 1 tablet by mouth daily.     nystatin cream (MYCOSTATIN) Apply 1 application. topically 2 (two) times daily. 30 g 2   ondansetron (ZOFRAN ODT) 4 MG disintegrating tablet Take 1 tablet (4 mg total) by mouth every 8 (eight) hours as needed for nausea or vomiting. 4 tablet 0  OXYGEN 2lpm with sleep only  [facility]     primidone (MYSOLINE) 50 MG tablet TAKE 1 TABLET BY MOUTH IN THE  MORNING AND 3 TABLETS BY MOUTH  AT BEDTIME 360 tablet 3   rosuvastatin (CRESTOR) 40 MG tablet TAKE 1 TABLET BY MOUTH DAILY 100 tablet 2   spironolactone (ALDACTONE) 25 MG tablet TAKE 1 TABLET BY MOUTH TWICE  DAILY 200 tablet 2   No current facility-administered medications on file prior to visit.        ROS:  All others reviewed and negative.  Objective        PE:  BP 130/78 (BP Location: Right Arm, Patient Position: Sitting, Cuff Size: Normal)   Pulse 75   Temp 98.6 F (37 C) (Oral)   Ht 5\' 1"  (1.549 m)   Wt 280 lb (127 kg)   SpO2 96%   BMI 52.91 kg/m                 Constitutional: Pt appears in NAD               HENT: Head: NCAT.                Right Ear: External ear normal.                 Left Ear: External ear normal.                Eyes: . Pupils are equal, round, and reactive to light. Conjunctivae and EOM are normal               Nose: without d/c or deformity               Neck: Neck supple. Gross normal ROM               Cardiovascular: Normal rate and regular rhythm.                 Pulmonary/Chest: Effort normal and breath sounds without rales or wheezing.                Abd:  Soft, NT, ND, + BS, no organomegaly               Neurological: Pt is alert. At baseline orientation, motor grossly intact               Skin: Skin is warm. No rashes, no other new  lesions, LE edema - none               Psychiatric: Pt behavior is normal without agitation   Micro: none  Cardiac tracings I have personally interpreted today:  none  Pertinent Radiological findings (summarize): none   Lab Results  Component Value Date   WBC 9.6 01/26/2023   HGB 17.2 (H) 01/26/2023   HCT 52.7 (H) 01/26/2023   PLT 167.0 01/26/2023   GLUCOSE 100 (H) 01/26/2023   CHOL 156 01/26/2023   TRIG 281.0 (H) 01/26/2023   HDL 38.50 (L) 01/26/2023   LDLDIRECT 79.0 01/26/2023   LDLCALC 54 03/11/2022   ALT 23 01/26/2023   AST 19 01/26/2023   NA 135 01/26/2023   K 4.0 01/26/2023   CL 97 01/26/2023   CREATININE 0.56 01/26/2023   BUN 24 (H) 01/26/2023   CO2 28 01/26/2023   TSH 3.270 03/11/2022   HGBA1C 7.0 (H) 01/26/2023   MICROALBUR 2.6 (H) 01/26/2023   Assessment/Plan:  Autumn Massey is a 57 y.o. White or Caucasian [  1] female with  has a past medical history of Anxiety, ANXIETY DEPRESSION (01/29/2009), Arthritis, Asthma, CHF (congestive heart failure) (HCC), DEPRESSION (11/19/2007), Diabetes (HCC), Dyspnea, FREQUENCY, URINARY (10/17/2009), HOH (hard of hearing), HYPERLIPIDEMIA (11/19/2007), HYPERTENSION (11/19/2007), HYPOTHYROIDISM (11/19/2007), Morbid obesity (HCC), Neuropathy (01/26/2019), Neuropathy, PNEUMONIA (08/06/2010), Pneumonia, POLYCYTHEMIA (01/29/2009), Preventative health care (04/06/2011), Sleep apnea, SLEEP RELATED HYPOVENTILATION/HYPOXEMIA CCE (02/08/2009), and Venous insufficiency (07/19/2012).  Hypothyroidism Lab Results  Component Value Date   TSH 3.270 03/11/2022   Stable, pt to continue levothyroxine 225 mcg qd   HLD (hyperlipidemia) Lab Results  Component Value Date   LDLCALC 54 03/11/2022   Stable, pt to continue current statin crestor 40 every day, also for cardiac CT score   Type 2 diabetes mellitus with hyperglycemia, without long-term current use of insulin (HCC) Lab Results  Component Value Date   HGBA1C 7.0 (H) 01/26/2023    uncontrolled, pt to continue current medical treatment jardiance 25 every day, metformin ER 500 mg - 1 every day as declines change   Vitamin D deficiency Last vitamin D Lab Results  Component Value Date   VD25OH 47.22 01/26/2023   Stable, cont oral replacement   Grief reaction Recent worsening situationally, declines referral for counseling  Vitamin B12 deficiency Lab Results  Component Value Date   VITAMINB12 682 01/26/2023   Stable, cont oral replacement - b12 1000 mcg qd   Essential hypertension BP Readings from Last 3 Encounters:  01/26/23 130/78  10/14/22 109/69  10/06/22 118/60   Stable, pt to continue medical treatment ziac 5 6.25, avapro 75 qd  Followup: Return in about 6 months (around 07/29/2023).  Oliver Barre, MD 01/26/2023 9:13 PM [facility]

## 2023-01-26 NOTE — Assessment & Plan Note (Signed)
Lab Results  Component Value Date   TSH 3.270 03/11/2022   Stable, pt to continue levothyroxine 225 mcg qd

## 2023-01-26 NOTE — Assessment & Plan Note (Signed)
Lab Results  Component Value Date   VITAMINB12 682 01/26/2023   Stable, cont oral replacement - b12 1000 mcg qd

## 2023-01-26 NOTE — Assessment & Plan Note (Signed)
Last vitamin D Lab Results  Component Value Date   VD25OH 47.22 01/26/2023   Stable, cont oral replacement

## 2023-01-26 NOTE — Assessment & Plan Note (Signed)
BP Readings from Last 3 Encounters:  01/26/23 130/78  10/14/22 109/69  10/06/22 118/60   Stable, pt to continue medical treatment ziac 5 6.25, avapro 75 qd

## 2023-01-27 NOTE — Progress Notes (Signed)
The test results show that your current treatment is OK, as the tests are stable.  Please continue the same plan.  There is no other need for change of treatment or further evaluation based on these results, at this time.  thanks 

## 2023-01-29 ENCOUNTER — Other Ambulatory Visit: Payer: Self-pay | Admitting: Internal Medicine

## 2023-01-29 DIAGNOSIS — Z1231 Encounter for screening mammogram for malignant neoplasm of breast: Secondary | ICD-10-CM

## 2023-02-09 DIAGNOSIS — Z1231 Encounter for screening mammogram for malignant neoplasm of breast: Secondary | ICD-10-CM

## 2023-02-11 ENCOUNTER — Encounter: Payer: Self-pay | Admitting: Internal Medicine

## 2023-02-11 MED ORDER — PREDNISONE 10 MG PO TABS: ORAL_TABLET | ORAL | 0 refills | Status: DC

## 2023-02-11 MED ORDER — NIRMATRELVIR/RITONAVIR (PAXLOVID)TABLET: 3.0000 | ORAL_TABLET | Freq: Two times a day (BID) | ORAL | 0 refills | Status: AC

## 2023-02-12 ENCOUNTER — Telehealth: Payer: Medicare Other | Admitting: Internal Medicine

## 2023-02-12 DIAGNOSIS — E559 Vitamin D deficiency, unspecified: Secondary | ICD-10-CM

## 2023-02-12 DIAGNOSIS — U071 COVID-19: Secondary | ICD-10-CM | POA: Diagnosis not present

## 2023-02-12 DIAGNOSIS — E1165 Type 2 diabetes mellitus with hyperglycemia: Secondary | ICD-10-CM

## 2023-02-12 DIAGNOSIS — E538 Deficiency of other specified B group vitamins: Secondary | ICD-10-CM | POA: Diagnosis not present

## 2023-02-12 MED ORDER — MOLNUPIRAVIR 200 MG PO CAPS: 4.0000 | ORAL_CAPSULE | Freq: Two times a day (BID) | ORAL | 0 refills | Status: AC

## 2023-02-12 NOTE — Progress Notes (Signed)
Patient ID: Autumn Massey, female   DOB: Nov 21, 1965, 57 y.o.   MRN: [id number]  Virtual Visit via Video Note  I connected with Autumn Massey on 02/15/23 at  3:40 PM EDT by a video enabled telemedicine application and verified that I am speaking with the correct person using two identifiers.  Location of all participants today Patient: at home Provider: at office   I discussed the limitations of evaluation and management by telemedicine and the availability of in person appointments. The patient expressed understanding and agreed to proceed.  History of Present Illness: Here to f/u with 7 days onset covid URI symptoms with congestion, feverish, ST, cough and mild wheezing, sob but no diarrhea.  Was surprised to find the test positive today, and realizes she is outside the window for paxlovid.  Has been improved symptom wise with mucinex, and asks for prednisone as she required with last covid infection.  Pt denies chest pain, iorthopnea, PND, increased LE swelling, palpitations, dizziness or syncope.  Pt denies polydipsia, polyuria, or new focal neuro s/s.    Pt denies fever, wt loss, night sweats, loss of appetite, or other constitutional symptoms   Past Medical History:  Diagnosis Date   Anxiety    ANXIETY DEPRESSION 01/29/2009   04/26/2019- no current   Arthritis    Asthma    CHF (congestive heart failure) (HCC)    DEPRESSION 11/19/2007   Diabetes (HCC)    Dyspnea    FREQUENCY, URINARY 10/17/2009   HOH (hard of hearing)    HYPERLIPIDEMIA 11/19/2007   HYPERTENSION 11/19/2007   HYPOTHYROIDISM 11/19/2007   Morbid obesity (HCC)    Neuropathy 01/26/2019   Neuropathy    PNEUMONIA 08/06/2010   Pneumonia    POLYCYTHEMIA 01/29/2009   Preventative health care 04/06/2011   Sleep apnea    mild sleep apnea, on o2 at 2l at nighttime   SLEEP RELATED HYPOVENTILATION/HYPOXEMIA CCE 02/08/2009   Venous insufficiency 07/19/2012   Past Surgical History:  Procedure Laterality Date    ANTERIOR CERVICAL CORPECTOMY N/A 06/19/2021   Procedure: Cerivcal Six Corpectomy;  Surgeon: Coletta Memos, MD;  Location: [facility] OR;  Service: Neurosurgery;  Laterality: N/A;   DILITATION & CURRETTAGE/HYSTROSCOPY WITH NOVASURE ABLATION N/A 04/28/2019   Procedure: DILATATION & CURETTAGE/HYSTEROSCOPY WITH NOVASURE ABLATION;  Surgeon: Richarda Overlie, MD;  Location: [facility] OR;  Service: Gynecology;  Laterality: N/A;  [employer] rep will be here confirmed on 04/20/19   TONSILLECTOMY     ULNAR TUNNEL RELEASE Left 02/22/2013   Procedure: LEFT ULNAR NERVE DECOMPRESSION ;  Surgeon: Tami Ribas, MD;  Location: [facility];  Service: Orthopedics;  Laterality: Left;    reports that she has been smoking cigarettes. She has a 11 pack-year smoking history. She has never used smokeless tobacco. She reports that she does not currently use alcohol. She reports that she does not use drugs. family history includes Alcohol abuse in an other family member; Anxiety disorder in her mother; Arthritis in an other family member; Cancer in her maternal grandfather and mother; Heart disease in her mother; High Cholesterol in her sister; High blood pressure in her mother; Hypertension in an other family member; Osteoarthritis (age of onset: 69) in her maternal grandmother; Psoriasis in her brother; Scoliosis in her maternal grandmother; Stroke in her mother; Thyroid disease in her mother. Allergies  Allergen Reactions   Cymbalta [Duloxetine Hcl]     Altered mental state / agitated    Current Outpatient Medications on File Prior to Visit  Medication Sig Dispense Refill   Accu-Chek Softclix Lancets lancets USE AS INSTRUCTED TO CHECK BLOOD SUGAR ONCE DAILY 100 each 2   albuterol (PROVENTIL) (2.5 MG/3ML) 0.083% nebulizer solution Take 3 mLs (2.5 mg total) by nebulization every 6 (six) hours as needed for wheezing or shortness of breath. 75 mL 3   albuterol (VENTOLIN HFA) 108 (90 Base) MCG/ACT inhaler USE 2 INHALATIONS BY  MOUTH  EVERY 6 HOURS AS NEEDED 34 g 3   aspirin EC 81 MG tablet Take 81 mg by mouth daily.     Azelastine-Fluticasone 137-50 MCG/ACT SUSP Place 1 spray into the nose every 12 (twelve) hours. 23 g 0   bisoprolol-hydrochlorothiazide (ZIAC) 5-6.25 MG tablet TAKE 1 TABLET BY MOUTH DAILY 100 tablet 2   Blood Glucose Monitoring Suppl (ACCU-CHEK GUIDE) w/Device KIT Use as instructed to check blood sugar 2X daily 1 kit 0   BREO ELLIPTA 100-25 MCG/ACT AEPB USE 1 INHALATION BY MOUTH ONCE  DAILY AT THE SAME TIME EACH DAY 180 each 3   cholecalciferol (VITAMIN D3) 25 MCG (1000 UNIT) tablet Take 1,000 Units by mouth daily.     clonazePAM (KLONOPIN) 0.5 MG tablet Take 1 tablet (0.5 mg total) by mouth 2 (two) times daily as needed for anxiety. 60 tablet 5   cyclobenzaprine (FLEXERIL) 5 MG tablet TAKE 1 TABLET BY MOUTH 3  TIMES DAILY AS NEEDED FOR  MUSCLE SPASM(S) 270 tablet 1   Diclofenac Sodium (PENNSAID) 2 % SOLN Place 2 g onto the skin 2 (two) times daily. 112 g 3   FLUoxetine (PROZAC) 40 MG capsule TAKE 1 CAPSULE BY MOUTH DAILY 100 capsule 3   furosemide (LASIX) 40 MG tablet TAKE 1 TABLET BY MOUTH DAILY 90 tablet 1   Gabapentin Enacarbil (HORIZANT) 600 MG TBCR Take 1 tablet (600 mg total) by mouth at bedtime. 90 tablet 3   glucose blood (ACCU-CHEK GUIDE) test strip Use as instructed to check blood sugar 2X daily 200 each 3   HYDROcodone-acetaminophen (NORCO/VICODIN) 5-325 MG tablet Take 1 tablet by mouth every 6 (six) hours as needed for moderate pain.     irbesartan (AVAPRO) 75 MG tablet TAKE 1 TABLET BY MOUTH AT  BEDTIME 100 tablet 2   JARDIANCE 25 MG TABS tablet TAKE 1 TABLET BY MOUTH DAILY  BEFORE BREAKFAST 100 tablet 2   Lancets Misc. (ACCU-CHEK FASTCLIX LANCET) KIT Use to check blood sugar once a day 1 kit 0   levocetirizine (XYZAL) 5 MG tablet Take 1 tablet (5 mg total) by mouth every evening. 30 tablet 2   levothyroxine (SYNTHROID) 200 MCG tablet TAKE 1 TABLET BY MOUTH DAILY 100 tablet 2    levothyroxine (SYNTHROID) 25 MCG tablet TAKE 1 TABLET BY MOUTH DAILY  BEFORE BREAKFAST WITH 200 MCG  TABLET FOR A TOTAL DAILY DOSE OF 225 MCG 100 tablet 2   meloxicam (MOBIC) 15 MG tablet Take 1 tablet (15 mg total) by mouth daily. 30 tablet 0   metFORMIN (GLUCOPHAGE-XR) 500 MG 24 hr tablet TAKE 1 TABLET BY MOUTH DAILY  WITH BREAKFAST 100 tablet 2   molnupiravir EUA (LAGEVRIO) 200 MG CAPS capsule Take 4 capsules (800 mg total) by mouth 2 (two) times daily for 5 days. 40 capsule 0   montelukast (SINGULAIR) 10 MG tablet TAKE 1 TABLET BY MOUTH AT  BEDTIME 100 tablet 3   Multiple Vitamin (MULTIVITAMIN WITH MINERALS) TABS tablet Take 1 tablet by mouth daily.     nirmatrelvir/ritonavir (PAXLOVID) 20 x 150 MG & 10 x 100MG   TABS Take 3 tablets by mouth 2 (two) times daily for 5 days. (Take nirmatrelvir 150 mg two tablets twice daily for 5 days and ritonavir 100 mg one tablet twice daily for 5 days) Patient GFR is 101 30 tablet 0   nystatin cream (MYCOSTATIN) Apply 1 application. topically 2 (two) times daily. 30 g 2   ondansetron (ZOFRAN ODT) 4 MG disintegrating tablet Take 1 tablet (4 mg total) by mouth every 8 (eight) hours as needed for nausea or vomiting. 4 tablet 0   OXYGEN 2lpm with sleep only  [facility]     predniSONE (DELTASONE) 10 MG tablet 3 tabs by mouth per day for 3 days,2tabs per day for 3 days,1tab per day for 3 days 18 tablet 0   primidone (MYSOLINE) 50 MG tablet TAKE 1 TABLET BY MOUTH IN THE  MORNING AND 3 TABLETS BY MOUTH  AT BEDTIME 360 tablet 3   rosuvastatin (CRESTOR) 40 MG tablet TAKE 1 TABLET BY MOUTH DAILY 100 tablet 2   spironolactone (ALDACTONE) 25 MG tablet TAKE 1 TABLET BY MOUTH TWICE  DAILY 200 tablet 2   No current facility-administered medications on file prior to visit.    Observations/Objective: Alert, NAD, appropriate mood and affect, resps normal, cn 2-12 intact, moves all 4s, no visible rash or swelling Lab Results  Component Value Date   WBC 9.6 01/26/2023   HGB 17.2 (H)  01/26/2023   HCT 52.7 (H) 01/26/2023   PLT 167.0 01/26/2023   GLUCOSE 100 (H) 01/26/2023   CHOL 156 01/26/2023   TRIG 281.0 (H) 01/26/2023   HDL 38.50 (L) 01/26/2023   LDLDIRECT 79.0 01/26/2023   LDLCALC 54 03/11/2022   ALT 23 01/26/2023   AST 19 01/26/2023   NA 135 01/26/2023   K 4.0 01/26/2023   CL 97 01/26/2023   CREATININE 0.56 01/26/2023   BUN 24 (H) 01/26/2023   CO2 28 01/26/2023   TSH 3.270 03/11/2022   HGBA1C 7.0 (H) 01/26/2023   MICROALBUR 2.6 (H) 01/26/2023   Assessment and Plan: See notes  Follow Up Instructions: See notes   I discussed the assessment and treatment plan with the patient. The patient was provided an opportunity to ask questions and all were answered. The patient agreed with the plan and demonstrated an understanding of the instructions.   The patient was advised to call back or seek an in-person evaluation if the symptoms worsen or if the condition fails to improve as anticipated.   Oliver Barre, MD

## 2023-02-12 NOTE — Addendum Note (Signed)
Addended by: Corwin Levins on: 02/12/2023 12:00 PM   Modules accepted: Orders

## 2023-02-15 ENCOUNTER — Encounter: Payer: Self-pay | Admitting: Internal Medicine

## 2023-02-15 DIAGNOSIS — U071 COVID-19: Secondary | ICD-10-CM | POA: Insufficient documentation

## 2023-02-15 NOTE — Assessment & Plan Note (Signed)
 Lab Results  Component Value Date   VITAMINB12 682 01/26/2023   Stable, cont oral replacement - b12 1000 mcg qd

## 2023-02-15 NOTE — Assessment & Plan Note (Signed)
 Last vitamin D Lab Results  Component Value Date   VD25OH 47.22 01/26/2023   Stable, cont oral replacement

## 2023-02-15 NOTE — Assessment & Plan Note (Signed)
D/w pt, ok for prednisone taper, mucinex bid, but is out of window for anti covid antibx.   to f/u any worsening symptoms or concerns

## 2023-02-15 NOTE — Assessment & Plan Note (Signed)
Lab Results  Component Value Date   HGBA1C 7.0 (H) 01/26/2023   Stable, pt to continue current medical treatment jardiance 25 every day, metformin ER 500 mg qd

## 2023-02-15 NOTE — Patient Instructions (Signed)
Please take all new medication as prescribed 

## 2023-02-18 DIAGNOSIS — J449 Chronic obstructive pulmonary disease, unspecified: Secondary | ICD-10-CM | POA: Diagnosis not present

## 2023-02-26 ENCOUNTER — Encounter: Payer: Self-pay | Admitting: Internal Medicine

## 2023-02-26 ENCOUNTER — Ambulatory Visit (INDEPENDENT_AMBULATORY_CARE_PROVIDER_SITE_OTHER): Payer: Medicare Other | Admitting: Internal Medicine

## 2023-02-26 VITALS — BP 124/70 | HR 77 | Ht 61.0 in | Wt 284.6 lb

## 2023-02-26 DIAGNOSIS — E039 Hypothyroidism, unspecified: Secondary | ICD-10-CM | POA: Diagnosis not present

## 2023-02-26 DIAGNOSIS — E1165 Type 2 diabetes mellitus with hyperglycemia: Secondary | ICD-10-CM | POA: Diagnosis not present

## 2023-02-26 DIAGNOSIS — Z7984 Long term (current) use of oral hypoglycemic drugs: Secondary | ICD-10-CM

## 2023-02-26 DIAGNOSIS — E282 Polycystic ovarian syndrome: Secondary | ICD-10-CM

## 2023-02-26 NOTE — Progress Notes (Addendum)
Patient ID: Autumn Massey, female   DOB: 1965-07-29, 57 y.o.   MRN: [id number]  HPI  Autumn Massey is a 57 y.o.-year-old female, returning for f/u for DM2, hypothyroidism, and PCOS. Last visit 9 months ago.  Interim history: She was referred to a plastic surgeon to remove her abdominal pannus.  She needs to have a BMI <45 before surgery.  Also, HbA1c has been lower than 8.  She was offered gastric bypass, but she refused.  She is now going to the weight management clinic -last appointment 2 months ago.  Her mother passed away last month and she was very busy and traveling to [place]. She had Covid19 2 weeks ago. She transiently lost her smell. She had po steroids >> now off. No increased urination, blurry vision, nausea, chest pain.  PCOS: Reviewed history:  - weight gain: Maximal weight 364  - was on Phentermine, which worked initially, then stopped working  - Would consider gastric bypass but only as a last resort - + hirsutism >> started to see facial hair in her 30s - + hair loss >> female pattern, and bald spot in vertex - No acne - had a Mirena IUD placed 04/2013 for heavy menstrual cycles >> out now; no menses since IUD out - has frequent Prednisone tapers    She was previously on spironolactone 50 mg twice daily but her potassium increased so we had to decrease the dose to 25 mg twice daily.  We ended up stopping spironolactone 11/2016.  However, she started to have more facial hair and more frontal balding so we restarted spironolactone 25 mg twice a day.  Her potassium level is normal now: Lab Results  Component Value Date   K 4.0 01/26/2023   Hypothyroidism: Pt. has been dx with hypothyroidism in 2005.  Pt is on levothyroxine (crushed) 225 mcg daily, taken: - in am - fasting - at least 3h from b'fast - no Ca, Fe - + MVI 2h later! >> moved 3-4h later - no PPIs - not on Biotin  Reviewed her TFTs: Lab Results  Component Value Date   TSH 3.270 03/11/2022   TSH  2.55 07/10/2021   TSH 4.06 03/15/2021   TSH 21.97 (H) 12/06/2020   TSH 13.74 (H) 10/03/2020   TSH 42.400 (H) 09/19/2020   TSH 71.60 (H) 01/27/2020   TSH 0.80 10/20/2019   TSH 0.19 (L) 03/01/2019   TSH 0.16 (L) 01/26/2019   FREET4 1.40 03/11/2022   FREET4 1.02 03/15/2021   FREET4 0.70 12/06/2020   FREET4 0.90 10/03/2020   FREET4 0.68 (L) 09/19/2020   FREET4 1.17 10/20/2019   FREET4 1.23 03/01/2019   FREET4 1.21 07/26/2018   FREET4 0.97 04/30/2018   FREET4 1.32 03/02/2018    Pt denies: - feeling nodules in neck - hoarseness - dysphagia - choking  She was on Neurontin for tremors, but had to decrease the dose because of somnolence and then she was able to stop. She is on primidone >> restarted by Dr. Everlena Cooper.  She was on Lyrica at night >> switched to Gabapentin.  DM2: Reviewed HbA1c levels: Lab Results  Component Value Date   HGBA1C 7.0 (H) 01/26/2023   HGBA1C 7.0 (A) 05/02/2022   HGBA1C 7.6 (A) 01/24/2022  04/26/2019: HbA1c calculated from fructosamine is 5.4% 03/09/2018: HbA1c calculated from fructosamine is better, at 5.5%! 09/02/2017: HbA1c calculated from Fructosamine is 6.1%. 05/05/2017: HbA1c calculated from Fructosamine is 6.00%. 03/10/2017: HbA1c calculated from Fructosamine is 6.17% (great!). 11/2016: HbA1c calculated from  the fructosamine is radically better, at 5.77%!  She is on: - Metformin 1000 mg with dinner >> 500 mg with dinner >> in am - Jardiance 10 mg in the pm -  started 02/2017 >> initially stopped b/c UTI and also had a tear on her labia >> resolved -on 25 mg daily -  >> off 2/2 decreased appetite Not taking Januvia >> not covered. She was previously on glipizide.  She was checking sugars 1-2 times a day: - am: 120s >> 123, 141 >> 110-132 >> 114-138 >> 120-125 >> 100-110 - after b'fast: 153 >> n/c - lunch: n/c >> 98-191 - after lunch: 145-150 >> 142 >> 131-152 >> n/c >> 123 >> 141, 170s - dinner: n/c >> 120-140, 157 >> n/c >> 94-114 >> n/c -  after dinner: 173-180 >> 149-173 >> 142-168 >> n/c Highest CBG: 188 >> 230 (b'day cake + iceceam) >> 140s.  No CKD: Lab Results  Component Value Date   BUN 24 (H) 01/26/2023   Lab Results  Component Value Date   CREATININE 0.56 01/26/2023   Lab Results  Component Value Date   MICRALBCREAT 5.0 01/26/2023   MICRALBCREAT 4.5 07/10/2021   MICRALBCREAT 31 (H) 01/27/2020   MICRALBCREAT 2.6 01/26/2019   MICRALBCREAT 0.5 11/22/2014   + HL: Lab Results  Component Value Date   CHOL 156 01/26/2023   HDL 38.50 (L) 01/26/2023   LDLCALC 54 03/11/2022   LDLDIRECT 79.0 01/26/2023   TRIG 281.0 (H) 01/26/2023   CHOLHDL 4 01/26/2023  She is on Crestor 40 mg daily.  She had an eye exam - 02/2022: No DR reportedly.  She had a foot exam 07/18/2022.  She quit smoking 05/2015.  She had a D&C on 04/28/2019.  ROS: + See HPI + Tremors   I reviewed pt's medications, allergies, PMH, social hx, family hx, and changes were documented in the history of present illness. Otherwise, unchanged from my initial visit note.  She does not have a family history of medullary thyroid cancer or personal history of pancreatitis.  Past Medical History:  Diagnosis Date   Anxiety    ANXIETY DEPRESSION 01/29/2009   04/26/2019- no current   Arthritis    Asthma    CHF (congestive heart failure) (HCC)    DEPRESSION 11/19/2007   Diabetes (HCC)    Dyspnea    FREQUENCY, URINARY 10/17/2009   HOH (hard of hearing)    HYPERLIPIDEMIA 11/19/2007   HYPERTENSION 11/19/2007   HYPOTHYROIDISM 11/19/2007   Morbid obesity (HCC)    Neuropathy 01/26/2019   Neuropathy    PNEUMONIA 08/06/2010   Pneumonia    POLYCYTHEMIA 01/29/2009   Preventative health care 04/06/2011   Sleep apnea    mild sleep apnea, on o2 at 2l at nighttime   SLEEP RELATED HYPOVENTILATION/HYPOXEMIA CCE 02/08/2009   Venous insufficiency 07/19/2012   Past Surgical History:  Procedure Laterality Date   ANTERIOR CERVICAL CORPECTOMY N/A  06/19/2021   Procedure: Cerivcal Six Corpectomy;  Surgeon: Coletta Memos, MD;  Location: [facility] OR;  Service: Neurosurgery;  Laterality: N/A;   DILITATION & CURRETTAGE/HYSTROSCOPY WITH NOVASURE ABLATION N/A 04/28/2019   Procedure: DILATATION & CURETTAGE/HYSTEROSCOPY WITH NOVASURE ABLATION;  Surgeon: Richarda Overlie, MD;  Location: [facility] OR;  Service: Gynecology;  Laterality: N/A;  [employer] rep will be here confirmed on 04/20/19   TONSILLECTOMY     ULNAR TUNNEL RELEASE Left 02/22/2013   Procedure: LEFT ULNAR NERVE DECOMPRESSION ;  Surgeon: Tami Ribas, MD;  Location: [facility];  Service:  Orthopedics;  Laterality: Left;   Social History   Socioeconomic History   Marital status: Divorced    Spouse name: Not on file   Number of children: 0   Years of education: 12   Highest education level: 12th grade  Occupational History   Occupation: Disabled    Employer: [employer]  Tobacco Use   Smoking status: Every Day    Current packs/day: 0.50    Average packs/day: 0.5 packs/day for 22.0 years (11.0 ttl pk-yrs)    Types: Cigarettes   Smokeless tobacco: Never  Vaping Use   Vaping status: Never Used  Substance and Sexual Activity   Alcohol use: Not Currently    Comment: rare   Drug use: No   Sexual activity: Never  Other Topics Concern   Not on file  Social History Narrative   Patient is right-handed.   Social Determinants of Health   Financial Resource Strain: Low Risk  (01/26/2023)   Overall Financial Resource Strain (CARDIA)    Difficulty of Paying Living Expenses: Not very hard  Food Insecurity: No Food Insecurity (01/26/2023)   Hunger Vital Sign    Worried About Running Out of Food in the Last Year: Never true    Ran Out of Food in the Last Year: Never true  Transportation Needs: No Transportation Needs (01/26/2023)   PRAPARE - Administrator, Civil Service (Medical): No    Lack of Transportation (Non-Medical): No  Physical Activity: Insufficiently  Active (01/26/2023)   Exercise Vital Sign    Days of Exercise per Week: 2 days    Minutes of Exercise per Session: 10 min  Stress: No Stress Concern Present (01/26/2023)   [employer] of Occupational Health - Occupational Stress Questionnaire    Feeling of Stress : Only a little  Social Connections: Moderately Integrated (01/26/2023)   Social Connection and Isolation Panel [NHANES]    Frequency of Communication with Friends and Family: More than three times a week    Frequency of Social Gatherings with Friends and Family: Once a week    Attends Religious Services: 1 to 4 times per year    Active Member of [employer] or Organizations: No    Attends Engineer, structural: More than 4 times per year    Marital Status: Divorced  Catering manager Violence: Not At Risk (04/30/2022)   Humiliation, Afraid, Rape, and Kick questionnaire    Fear of Current or Ex-Partner: No    Emotionally Abused: No    Physically Abused: No    Sexually Abused: No   Current Outpatient Medications on File Prior to Visit  Medication Sig Dispense Refill   Accu-Chek Softclix Lancets lancets USE AS INSTRUCTED TO CHECK BLOOD SUGAR ONCE DAILY 100 each 2   albuterol (PROVENTIL) (2.5 MG/3ML) 0.083% nebulizer solution Take 3 mLs (2.5 mg total) by nebulization every 6 (six) hours as needed for wheezing or shortness of breath. 75 mL 3   albuterol (VENTOLIN HFA) 108 (90 Base) MCG/ACT inhaler USE 2 INHALATIONS BY MOUTH  EVERY 6 HOURS AS NEEDED 34 g 3   aspirin EC 81 MG tablet Take 81 mg by mouth daily.     Azelastine-Fluticasone 137-50 MCG/ACT SUSP Place 1 spray into the nose every 12 (twelve) hours. 23 g 0   bisoprolol-hydrochlorothiazide (ZIAC) 5-6.25 MG tablet TAKE 1 TABLET BY MOUTH DAILY 100 tablet 2   Blood Glucose Monitoring Suppl (ACCU-CHEK GUIDE) w/Device KIT Use as instructed to check blood sugar 2X daily 1 kit 0  BREO ELLIPTA 100-25 MCG/ACT AEPB USE 1 INHALATION BY MOUTH ONCE  DAILY AT THE SAME TIME EACH DAY 180  each 3   cholecalciferol (VITAMIN D3) 25 MCG (1000 UNIT) tablet Take 1,000 Units by mouth daily.     clonazePAM (KLONOPIN) 0.5 MG tablet Take 1 tablet (0.5 mg total) by mouth 2 (two) times daily as needed for anxiety. 60 tablet 5   cyclobenzaprine (FLEXERIL) 5 MG tablet TAKE 1 TABLET BY MOUTH 3  TIMES DAILY AS NEEDED FOR  MUSCLE SPASM(S) 270 tablet 1   Diclofenac Sodium (PENNSAID) 2 % SOLN Place 2 g onto the skin 2 (two) times daily. 112 g 3   FLUoxetine (PROZAC) 40 MG capsule TAKE 1 CAPSULE BY MOUTH DAILY 100 capsule 3   furosemide (LASIX) 40 MG tablet TAKE 1 TABLET BY MOUTH DAILY 90 tablet 1   Gabapentin Enacarbil (HORIZANT) 600 MG TBCR Take 1 tablet (600 mg total) by mouth at bedtime. 90 tablet 3   glucose blood (ACCU-CHEK GUIDE) test strip Use as instructed to check blood sugar 2X daily 200 each 3   HYDROcodone-acetaminophen (NORCO/VICODIN) 5-325 MG tablet Take 1 tablet by mouth every 6 (six) hours as needed for moderate pain.     irbesartan (AVAPRO) 75 MG tablet TAKE 1 TABLET BY MOUTH AT  BEDTIME 100 tablet 2   JARDIANCE 25 MG TABS tablet TAKE 1 TABLET BY MOUTH DAILY  BEFORE BREAKFAST 100 tablet 2   Lancets Misc. (ACCU-CHEK FASTCLIX LANCET) KIT Use to check blood sugar once a day 1 kit 0   levocetirizine (XYZAL) 5 MG tablet Take 1 tablet (5 mg total) by mouth every evening. 30 tablet 2   levothyroxine (SYNTHROID) 200 MCG tablet TAKE 1 TABLET BY MOUTH DAILY 100 tablet 2   levothyroxine (SYNTHROID) 25 MCG tablet TAKE 1 TABLET BY MOUTH DAILY  BEFORE BREAKFAST WITH 200 MCG  TABLET FOR A TOTAL DAILY DOSE OF 225 MCG 100 tablet 2   meloxicam (MOBIC) 15 MG tablet Take 1 tablet (15 mg total) by mouth daily. 30 tablet 0   metFORMIN (GLUCOPHAGE-XR) 500 MG 24 hr tablet TAKE 1 TABLET BY MOUTH DAILY  WITH BREAKFAST 100 tablet 2   montelukast (SINGULAIR) 10 MG tablet TAKE 1 TABLET BY MOUTH AT  BEDTIME 100 tablet 3   Multiple Vitamin (MULTIVITAMIN WITH MINERALS) TABS tablet Take 1 tablet by mouth daily.      nystatin cream (MYCOSTATIN) Apply 1 application. topically 2 (two) times daily. 30 g 2   ondansetron (ZOFRAN ODT) 4 MG disintegrating tablet Take 1 tablet (4 mg total) by mouth every 8 (eight) hours as needed for nausea or vomiting. 4 tablet 0   OXYGEN 2lpm with sleep only  [facility]     predniSONE (DELTASONE) 10 MG tablet 3 tabs by mouth per day for 3 days,2tabs per day for 3 days,1tab per day for 3 days 18 tablet 0   primidone (MYSOLINE) 50 MG tablet TAKE 1 TABLET BY MOUTH IN THE  MORNING AND 3 TABLETS BY MOUTH  AT BEDTIME 360 tablet 3   rosuvastatin (CRESTOR) 40 MG tablet TAKE 1 TABLET BY MOUTH DAILY 100 tablet 2   spironolactone (ALDACTONE) 25 MG tablet TAKE 1 TABLET BY MOUTH TWICE  DAILY 200 tablet 2   No current facility-administered medications on file prior to visit.   Allergies  Allergen Reactions   Cymbalta [Duloxetine Hcl]     Altered mental state / agitated    Family History  Problem Relation Age of Onset  Cancer Mother        melanoma    Heart disease Mother    High blood pressure Mother    Stroke Mother    Thyroid disease Mother    Anxiety disorder Mother    High Cholesterol Sister    Psoriasis Brother    Osteoarthritis Maternal Grandmother 44   Scoliosis Maternal Grandmother    Cancer Maternal Grandfather        prostate cancer   Alcohol abuse Other    Arthritis Other    Hypertension Other    PE: BP 124/70   Pulse 77   Ht 5\' 1"  (1.549 m)   Wt 284 lb 9.6 oz (129.1 kg)   SpO2 92%   BMI 53.77 kg/m  Wt Readings from Last 3 Encounters:  02/26/23 284 lb 9.6 oz (129.1 kg)  01/26/23 280 lb (127 kg)  11/04/22 280 lb (127 kg)   Constitutional: overweight, in NAD Eyes: no exophthalmos ENT: no thyromegaly, no cervical lymphadenopathy Cardiovascular: RRR, No MRG Respiratory: CTA B Musculoskeletal: no deformities Skin:  + stasis dermatitis rashes on lower legs R>L Neurological: no tremor with outstretched hands  PLAN:  1. Patient with longstanding  hypothyroidism -Previously uncontrolled -Last year, she had very high TSH levels, 71 and 42 respectively, despite a high dose of levothyroxine.  I was not aware of these results, but I became aware of her TSH from 09/2020 which improved to 13.7.  Afterwards, we discussed about the need for compliance and also advised her to crush the levothyroxine tablet, after which her TFTs normalized. - latest thyroid labs reviewed with pt. >> normal: Lab Results  Component Value Date   TSH 3.270 03/11/2022  - she continues on LT4 225 mcg daily - pt feels good on this dose. - we discussed about taking the thyroid hormone every day, with water, >30 minutes before breakfast, separated by >4 hours from acid reflux medications, calcium, iron, multivitamins. Pt. is taking it correctly. - will check thyroid tests today: TSH and fT4 - If labs are abnormal, she will need to return for repeat TFTs in 1.5 months  2. PCOS -She aged out her PCOS diagnosis -However, she continues to have increased facial hair and alopecia and she continues spironolactone 25 mg twice a day, as she feels that she still benefits from this -Potassium level was normal: Lab Results  Component Value Date   K 4.0 01/26/2023  -Cannot increase the spironolactone dose further due to previous hyperkalemia with higher doses  3. DM2 -She is seen in the weight management clinic.  At last visit, she had to come off of Ozempic to be able to eat all the recommended food.  Since the sugars were at goal, we continued her only on metformin and Jardiance. -She now returns after a longer absence, up 9 months. -At today's visit, she has not been checking sugars in the last 2 months due to being busy and grieving after her mom, but plans to restart.  The blood sugars that she checked previously were at goal.  HbA1c obtained last month was stable, at 7.0%.  At today's visit, we discussed about the importance of checking blood sugars but I did not suggest a  change in regimen. - I suggested: Patient Instructions  Please continue: - Metformin ER 500 mg with breakfast - Jardiance 25 mg before breakfast   Please continue the crushed levothyroxine 225 g daily.   Take the thyroid hormone every day, with water, at least 30 minutes before  breakfast, separated by at least 4 hours from: - acid reflux medications - calcium - iron - multivitamins   Continue spironolactone 25 mg twice a day.   Please return in 6 months.  - advised to check sugars at different times of the day - 1x a day, rotating check times - advised for yearly eye exams >> she is UTD - return to clinic in 6 months  4.  Obesity class III -She continues on Jardiance but is off Ozempic due to decreased appetite -In the past, she was doing a partially keto diet with intermittent fasting 16-8 -She gained a significant amount of weight during her COVID infection at the beginning of 2022 -Before last visit she started hand weights and chair exercises -She then started to see weight management clinic, last visit 2 months ago -she lost 12 lbs before last visit -She is planning to have abdominal pannus surgery, but BMI needs to be lower than 45, currently 53 -weight stable since last OV -She will return to see the weight management clinic.  Component     Latest Ref Rng 02/26/2023  TSH     0.35 - 5.50 uIU/mL 6.17 (H)   T4,Free(Direct)     0.60 - 1.60 ng/dL 4.78   TSH slightly elevated.  Due to previous variability in her TFTs, my suggestion would be to repeat her TFTs in 1.5 months and only then change the LT4 dose if needed.  Carlus Pavlov, MD PhD [facility] Endocr[facility]

## 2023-02-26 NOTE — Patient Instructions (Signed)
Please continue: - Metformin ER 500 mg with breakfast - Jardiance 25 mg before breakfast  Please continue the crushed levothyroxine 225 g daily.   Take the thyroid hormone every day, with water, at least 30 minutes before breakfast, separated by at least 4 hours from: - acid reflux medications - calcium - iron - multivitamins   Continue spironolactone 25 mg twice a day.   Please return in 6 months.

## 2023-02-27 LAB — T4, FREE: Free T4: 0.99 ng/dL (ref 0.60–1.60)

## 2023-02-27 LAB — TSH: TSH: 6.17 u[IU]/mL — ABNORMAL HIGH (ref 0.35–5.50)

## 2023-02-27 NOTE — Addendum Note (Signed)
Addended by: Carlus Pavlov on: 02/27/2023 12:56 PM   Modules accepted: Orders

## 2023-03-09 ENCOUNTER — Other Ambulatory Visit: Payer: Self-pay | Admitting: Internal Medicine

## 2023-03-16 ENCOUNTER — Ambulatory Visit
Admission: RE | Admit: 2023-03-16 | Discharge: 2023-03-16 | Disposition: A | Discharge status: Home / Self Care | Payer: Medicare Other | Source: Ambulatory Visit | Attending: Internal Medicine | Admitting: Internal Medicine

## 2023-03-16 DIAGNOSIS — Z1231 Encounter for screening mammogram for malignant neoplasm of breast: Secondary | ICD-10-CM | POA: Diagnosis not present

## 2023-03-21 DIAGNOSIS — J449 Chronic obstructive pulmonary disease, unspecified: Secondary | ICD-10-CM | POA: Diagnosis not present

## 2023-03-23 ENCOUNTER — Ambulatory Visit (INDEPENDENT_AMBULATORY_CARE_PROVIDER_SITE_OTHER): Payer: Medicare Other | Admitting: Family Medicine

## 2023-03-23 ENCOUNTER — Encounter (INDEPENDENT_AMBULATORY_CARE_PROVIDER_SITE_OTHER): Payer: Self-pay | Admitting: Family Medicine

## 2023-03-23 VITALS — BP 105/73 | HR 76 | Temp 98.9°F | Ht 61.0 in | Wt 281.0 lb

## 2023-03-23 DIAGNOSIS — Z7984 Long term (current) use of oral hypoglycemic drugs: Secondary | ICD-10-CM | POA: Diagnosis not present

## 2023-03-23 DIAGNOSIS — R600 Localized edema: Secondary | ICD-10-CM | POA: Diagnosis not present

## 2023-03-23 DIAGNOSIS — E559 Vitamin D deficiency, unspecified: Secondary | ICD-10-CM

## 2023-03-23 DIAGNOSIS — E1159 Type 2 diabetes mellitus with other circulatory complications: Secondary | ICD-10-CM

## 2023-03-23 DIAGNOSIS — I152 Hypertension secondary to endocrine disorders: Secondary | ICD-10-CM

## 2023-03-23 DIAGNOSIS — R7989 Other specified abnormal findings of blood chemistry: Secondary | ICD-10-CM

## 2023-03-23 DIAGNOSIS — Z6841 Body Mass Index (BMI) 40.0 and over, adult: Secondary | ICD-10-CM

## 2023-03-23 NOTE — Progress Notes (Signed)
Autumn Massey, D.O.  ABFM, ABOM Specializing in Clinical Bariatric Medicine  Office located at: [address] [place], [place]  [address]     Assessment and Plan:   Medications Discontinued During This Encounter  Medication Reason   predniSONE (DELTASONE) 10 MG tablet     No orders of the defined types were placed in this encounter.   Repeat labs at next OV in 03/2023.   Type 2 diabetes mellitus with other circulatory complication, without long-term current use of insulin [facility]) Assessment & Plan: Lab Results  Component Value Date   HGBA1C 7.0 (H) 01/26/2023   HGBA1C 7.0 (A) 05/02/2022   HGBA1C 7.6 (A) 01/24/2022   INSULIN 7.5 03/11/2022    Her hunger and cravings are well controlled on jardiance 25 mg daily and Metformin 500 mg daily. Tolerating well with no adverse side effects. Was previously on ozempic, which made her "not want to eat anything". Pt has not been monitoring her blood glucose levels regularly. When she does check, she is getting readings from 100s-120s in the mornings and 145-160s after lunch. We reviewed the results of her latest A1C, was 7.0 as of 01/26/23. She started on a category 2 meal plan on 10/14/22. When she switched form category 3 to category 2, she struggled with snacking from 300 calories to 200 calories. We discussed the importance of her journaling to track her caloric and protein intake.   Advised her to check blood glucose about 2 hours after her largest meal. I encouraged her to use apps such as MyFitnessPal or Lose It!, or to journal by hand. Continue with current regimen on jardiance, following prescribed meal plan, and journaling.    Hypertension associated with type 2 diabetes mellitus (HCC) Assessment & Plan: BP Readings from Last 3 Encounters:  03/23/23 105/73  02/26/23 124/70  01/26/23 130/78   BP at goal today. Per Pt, her BP is well controlled. She manages her HTN with ziac 5-6.25 mg daily, spironolactone 25 mg daily, and  avapro 75 mg daily. Tolerating her antihypertensives well with no reported adverse side effects. Continue current regimen of antihypertensives per PCP/specialist. We will continue to monitor closely alongside PCP/ specialists as they relate to her weight loss journey.    Vitamin D deficiency Assessment & Plan: Lab Results  Component Value Date   VD25OH 47.22 01/26/2023   VD25OH 83.22 09/11/2022   VD25OH 39.5 03/11/2022   Pt's last vitamin D was 47.22 on 01/26/23. Currently taking OTC vitamin D 2000 IU. Weight loss will likely improve availability of vitamin D, thus encouraged Cressida to continue with meal plan and their weight loss efforts to further improve this condition. Continue with current regimen on OTC vitamin D 2000 IU.   Elevated TSH Assessment & Plan: Lab Results  Component Value Date   TSH 6.17 (H) 02/26/2023   TSH was elevated at 6.17 on 02/26/23. Pt has been on same regimen for years. We will repeat labs in 03/2023.    Lower extremity edema Assessment & Plan: Pt is managing her LE edema with lasix 40 mg as needed, tends to take once weekly. Per pt, her left leg edema is well controlled but endorses constant swelling in right leg. She has previously been seen at the wound clinic for a wound on her right leg that "would not heal". I recommend she follow up at wound clinic for further questions or concerns. Continue on current regimen for LE edema. We will continue to monitor her condition as  it pertains to her weight loss journey.    Morbid obesity (HCC): Starting BMI 54.4 BMI 50.0-59.9, adult (HCC)-current BMI 53.12 Assessment & Plan: INDONESIA MCKEOUGH is here to discuss her progress with her obesity treatment plan along with follow-up of her obesity related diagnoses. See Medical Weight Management Flowsheet for complete bioelectrical impedance results.  Since last office visit on 10/14/22 patient's muscle mass has increased by 47.6 lbs. Fat mass has decreased by 43.6 lbs.   Counseling done on how various foods will affect these numbers and how to maximize success  Total lbs lost to date: 7 lbs Total weight loss percentage to date: -2.43%   Behavioral Intervention Additional resources provided today: N/a Evidence-based interventions for health behavior change were utilized today including the discussion of self monitoring techniques, problem-solving barriers and SMART goal setting techniques.   Regarding patient's less desirable eating habits and patterns, we employed the technique of small changes.  Pt will specifically work on:  Journaling  1400-1500 calories a day and 110+ g of protein for next visit.    FOLLOW UP: Return in about 3 weeks (around 04/13/2023). She was informed of the importance of frequent follow up visits to maximize her success with intensive lifestyle modifications for her multiple health conditions.  Subjective:   Chief complaint: Obesity Latorria is here to discuss her progress with her obesity treatment plan. She is on the Category 2 Plan and states she is following her eating plan approximately 50% of the time. She states she is exercising with hand weights for 10 minutes 3-4 days per week and "stretch bands" for 10 minutes 1 day a week.   Interval History:  Autumn Massey is here for a follow up office visit. Last time Pt was seen in office was 10/14/22 by Dr. Rikki Spearing. Since last OV, she has been struggling to follow he plan due to grieving her mother, who passed in July, as well as having Covid soon after her mother's funeral. In the last 2 weeks has been following plan more closely. Her hunger is well controlled. She tends to prioritize eating protein during meals. She only eats [employer] protein bars as snacks. Eats no-sugar apple sauce squeeze packs. Her pants are all elastic, but she notes pants and shirts are fitting better.   Pharmacotherapy for weight loss: She is currently taking  Jardiance and Metformin  for medical weight loss.   Denies side effects.    Review of Systems:  Pertinent positives were addressed with patient today.  Reviewed by clinician on day of visit: allergies, medications, problem list, medical history, surgical history, family history, social history, and previous encounter notes.  Weight Summary and Biometrics    The accuracy of biometric scale is questionable today.   Weight Lost Since Last Visit: 0lb  Weight Gained Since Last Visit: 7lb    Vitals Temp: 98.9 F (37.2 C) BP: 105/73 Pulse Rate: 76 SpO2: 96 %   Anthropometric Measurements Height: 5\' 1"  (1.549 m) Weight: 281 lb (127.5 kg) BMI (Calculated): 53.12 Weight at Last Visit: 274lb Weight Lost Since Last Visit: 0lb Weight Gained Since Last Visit: 7lb Starting Weight: 288lb Total Weight Loss (lbs): 7 lb (3.175 kg) Peak Weight: 367lb   Body Composition  Body Fat %: 39.4 % Fat Mass (lbs): 110.8 lbs Muscle Mass (lbs): 162 lbs Total Body Water (lbs): 115.2 lbs Visceral Fat Rating : 16   Other Clinical Data Fasting: no Labs: no Today's Visit #: 17 Starting Date: 03/11/22    Objective:  PHYSICAL EXAM: Blood pressure 105/73, pulse 76, temperature 98.9 F (37.2 C), height 5\' 1"  (1.549 m), weight 281 lb (127.5 kg), SpO2 96%. Body mass index is 53.09 kg/m.  General: Well Developed, well nourished, and in no acute distress.  HEENT: Normocephalic, atraumatic Skin: Warm and dry, cap RF less 2 sec, good turgor Chest:  Normal excursion, shape, no gross abn Respiratory: speaking in full sentences, no conversational dyspnea NeuroM-Sk: Ambulates w/o assistance, moves * 4 Psych: A and O *3, insight good, mood-full  DIAGNOSTIC DATA REVIEWED:  BMET    Component Value Date/Time   NA 135 01/26/2023 1503   NA 137 03/11/2022 1554   K 4.0 01/26/2023 1503   CL 97 01/26/2023 1503   CO2 28 01/26/2023 1503   GLUCOSE 100 (H) 01/26/2023 1503   BUN 24 (H) 01/26/2023 1503   BUN 11 03/11/2022 1554   CREATININE 0.56  01/26/2023 1503   CREATININE 0.73 03/02/2018 1157   CALCIUM 10.3 01/26/2023 1503   GFRNONAA >60 09/11/2022 0815   GFRNONAA 95 03/02/2018 1157   GFRAA >60 04/26/2019 1445   GFRAA 110 03/02/2018 1157   Lab Results  Component Value Date   HGBA1C 7.0 (H) 01/26/2023   HGBA1C 5.6 03/15/2012   Lab Results  Component Value Date   INSULIN 7.5 03/11/2022   Lab Results  Component Value Date   TSH 6.17 (H) 02/26/2023   CBC    Component Value Date/Time   WBC 9.6 01/26/2023 1503   RBC 5.52 (H) 01/26/2023 1503   HGB 17.2 (H) 01/26/2023 1503   HGB 19.6 (H) 03/11/2022 1554   HCT 52.7 (H) 01/26/2023 1503   HCT 57.0 (H) 03/11/2022 1554   PLT 167.0 01/26/2023 1503   PLT 167 03/11/2022 1554   MCV 95.3 01/26/2023 1503   MCV 92 03/11/2022 1554   MCH 30.3 09/11/2022 0815   MCHC 32.7 01/26/2023 1503   RDW 13.9 01/26/2023 1503   RDW 12.1 03/11/2022 1554   Iron Studies    Component Value Date/Time   IRON 93 01/26/2019 0959   FERRITIN 124.5 01/29/2009 0905   IRONPCTSAT 31.3 01/26/2019 0959   Lipid Panel     Component Value Date/Time   CHOL 156 01/26/2023 1503   CHOL 130 03/11/2022 1554   TRIG 281.0 (H) 01/26/2023 1503   HDL 38.50 (L) 01/26/2023 1503   HDL 34 (L) 03/11/2022 1554   CHOLHDL 4 01/26/2023 1503   VLDL 56.2 (H) 01/26/2023 1503   LDLCALC 54 03/11/2022 1554   LDLCALC 109 (H) 01/27/2020 1006   LDLDIRECT 79.0 01/26/2023 1503   Hepatic Function Panel     Component Value Date/Time   PROT 7.5 01/26/2023 1503   PROT 6.9 03/11/2022 1554   ALBUMIN 4.1 01/26/2023 1503   ALBUMIN 4.3 03/11/2022 1554   AST 19 01/26/2023 1503   ALT 23 01/26/2023 1503   ALKPHOS 64 01/26/2023 1503   BILITOT 0.7 01/26/2023 1503   BILITOT 0.7 03/11/2022 1554   BILIDIR 0.1 01/26/2023 1503   IBILI 0.4 01/27/2020 1006      Component Value Date/Time   TSH 6.17 (H) 02/26/2023 1559   Nutritional Lab Results  Component Value Date   VD25OH 47.22 01/26/2023   VD25OH 83.22 09/11/2022   VD25OH  39.5 03/11/2022    Attestations:   I, Isabelle Course, acting as a Stage manager for Thomasene Lot, DO., have compiled all relevant documentation for today's office visit on behalf of Thomasene Lot, DO, while in the presence of [employer], DO.  Reviewed by clinician on day of visit: allergies, medications, problem list, medical history, surgical history, family history, social history, and previous encounter notes pertinent to patient's obesity diagnosis.    This encounter took 35 total minutes of time including time coordinating the above treatment plan, any pre-visit chart review and post-visit documentation and reviewing any labs and/or imaging, reviewing pertinent prior notes, and providing counseling the patient about such treatment plan.  Approximately 50% of this time was spent in direct, face-to-face counseling and coordination of care.  I have reviewed the above documentation for accuracy and completeness, and I agree with the above. Autumn Massey, D.O.  The 21st Century Cures Act was signed into law in 2016 which includes the topic of electronic health records.  This provides immediate access to information in MyChart.  This includes consultation notes, operative notes, office notes, lab results and pathology reports.  If you have any questions about what you read please let us know at your next visit so we can discuss your concerns and take corrective action if need be.  We are right here with you.

## 2023-04-03 DIAGNOSIS — R051 Acute cough: Secondary | ICD-10-CM | POA: Diagnosis not present

## 2023-04-03 DIAGNOSIS — J019 Acute sinusitis, unspecified: Secondary | ICD-10-CM | POA: Diagnosis not present

## 2023-04-03 DIAGNOSIS — R509 Fever, unspecified: Secondary | ICD-10-CM | POA: Diagnosis not present

## 2023-04-03 DIAGNOSIS — J209 Acute bronchitis, unspecified: Secondary | ICD-10-CM | POA: Diagnosis not present

## 2023-04-03 DIAGNOSIS — R0981 Nasal congestion: Secondary | ICD-10-CM | POA: Diagnosis not present

## 2023-04-03 DIAGNOSIS — L03115 Cellulitis of right lower limb: Secondary | ICD-10-CM | POA: Diagnosis not present

## 2023-04-06 ENCOUNTER — Other Ambulatory Visit (HOSPITAL_BASED_OUTPATIENT_CLINIC_OR_DEPARTMENT_OTHER): Payer: Medicare Other

## 2023-04-06 ENCOUNTER — Encounter: Payer: Self-pay | Admitting: Internal Medicine

## 2023-04-07 MED ORDER — PREDNISONE 10 MG PO TABS: ORAL_TABLET | ORAL | 0 refills | Status: DC

## 2023-04-12 DIAGNOSIS — R051 Acute cough: Secondary | ICD-10-CM | POA: Diagnosis not present

## 2023-04-12 DIAGNOSIS — R0981 Nasal congestion: Secondary | ICD-10-CM | POA: Diagnosis not present

## 2023-04-12 DIAGNOSIS — R509 Fever, unspecified: Secondary | ICD-10-CM | POA: Diagnosis not present

## 2023-04-13 ENCOUNTER — Ambulatory Visit (HOSPITAL_BASED_OUTPATIENT_CLINIC_OR_DEPARTMENT_OTHER): Payer: Medicare Other | Admitting: Internal Medicine

## 2023-04-14 ENCOUNTER — Other Ambulatory Visit: Payer: Self-pay

## 2023-04-14 ENCOUNTER — Other Ambulatory Visit: Payer: Self-pay | Admitting: Internal Medicine

## 2023-04-21 DIAGNOSIS — M50222 Other cervical disc displacement at C5-C6 level: Secondary | ICD-10-CM | POA: Diagnosis not present

## 2023-04-27 ENCOUNTER — Encounter (HOSPITAL_BASED_OUTPATIENT_CLINIC_OR_DEPARTMENT_OTHER): Payer: Medicare Other | Attending: Internal Medicine | Admitting: Internal Medicine

## 2023-04-27 ENCOUNTER — Encounter (INDEPENDENT_AMBULATORY_CARE_PROVIDER_SITE_OTHER): Payer: Self-pay | Admitting: Family Medicine

## 2023-04-27 ENCOUNTER — Ambulatory Visit (HOSPITAL_BASED_OUTPATIENT_CLINIC_OR_DEPARTMENT_OTHER)
Admission: RE | Admit: 2023-04-27 | Discharge: 2023-04-27 | Disposition: A | Discharge status: Home / Self Care | Payer: Medicare Other | Source: Ambulatory Visit | Attending: Internal Medicine | Admitting: Internal Medicine

## 2023-04-27 ENCOUNTER — Other Ambulatory Visit: Payer: Self-pay | Admitting: Internal Medicine

## 2023-04-27 ENCOUNTER — Ambulatory Visit (INDEPENDENT_AMBULATORY_CARE_PROVIDER_SITE_OTHER): Payer: Medicare Other | Admitting: Family Medicine

## 2023-04-27 VITALS — BP 101/68 | HR 62 | Temp 98.9°F | Ht 61.0 in | Wt 280.0 lb

## 2023-04-27 DIAGNOSIS — Z9981 Dependence on supplemental oxygen: Secondary | ICD-10-CM | POA: Diagnosis not present

## 2023-04-27 DIAGNOSIS — E1165 Type 2 diabetes mellitus with hyperglycemia: Secondary | ICD-10-CM

## 2023-04-27 DIAGNOSIS — E1159 Type 2 diabetes mellitus with other circulatory complications: Secondary | ICD-10-CM | POA: Diagnosis not present

## 2023-04-27 DIAGNOSIS — F17210 Nicotine dependence, cigarettes, uncomplicated: Secondary | ICD-10-CM | POA: Insufficient documentation

## 2023-04-27 DIAGNOSIS — E785 Hyperlipidemia, unspecified: Secondary | ICD-10-CM | POA: Diagnosis not present

## 2023-04-27 DIAGNOSIS — E039 Hypothyroidism, unspecified: Secondary | ICD-10-CM | POA: Insufficient documentation

## 2023-04-27 DIAGNOSIS — I152 Hypertension secondary to endocrine disorders: Secondary | ICD-10-CM

## 2023-04-27 DIAGNOSIS — Z7984 Long term (current) use of oral hypoglycemic drugs: Secondary | ICD-10-CM

## 2023-04-27 DIAGNOSIS — J4489 Other specified chronic obstructive pulmonary disease: Secondary | ICD-10-CM | POA: Insufficient documentation

## 2023-04-27 DIAGNOSIS — G250 Essential tremor: Secondary | ICD-10-CM | POA: Diagnosis not present

## 2023-04-27 DIAGNOSIS — L97918 Non-pressure chronic ulcer of unspecified part of right lower leg with other specified severity: Secondary | ICD-10-CM | POA: Diagnosis not present

## 2023-04-27 DIAGNOSIS — I110 Hypertensive heart disease with heart failure: Secondary | ICD-10-CM | POA: Diagnosis not present

## 2023-04-27 DIAGNOSIS — Z6841 Body Mass Index (BMI) 40.0 and over, adult: Secondary | ICD-10-CM

## 2023-04-27 DIAGNOSIS — I87331 Chronic venous hypertension (idiopathic) with ulcer and inflammation of right lower extremity: Secondary | ICD-10-CM | POA: Insufficient documentation

## 2023-04-27 DIAGNOSIS — I872 Venous insufficiency (chronic) (peripheral): Secondary | ICD-10-CM | POA: Diagnosis not present

## 2023-04-27 DIAGNOSIS — E1140 Type 2 diabetes mellitus with diabetic neuropathy, unspecified: Secondary | ICD-10-CM | POA: Diagnosis not present

## 2023-04-27 DIAGNOSIS — R9431 Abnormal electrocardiogram [ECG] [EKG]: Secondary | ICD-10-CM

## 2023-04-27 DIAGNOSIS — M1990 Unspecified osteoarthritis, unspecified site: Secondary | ICD-10-CM | POA: Insufficient documentation

## 2023-04-27 DIAGNOSIS — E11622 Type 2 diabetes mellitus with other skin ulcer: Secondary | ICD-10-CM | POA: Diagnosis not present

## 2023-04-27 DIAGNOSIS — I509 Heart failure, unspecified: Secondary | ICD-10-CM | POA: Diagnosis not present

## 2023-04-27 DIAGNOSIS — L97812 Non-pressure chronic ulcer of other part of right lower leg with fat layer exposed: Secondary | ICD-10-CM | POA: Diagnosis not present

## 2023-04-27 MED ORDER — OZEMPIC (0.25 OR 0.5 MG/DOSE) 2 MG/3ML ~~LOC~~ SOPN: PEN_INJECTOR | SUBCUTANEOUS | Status: DC

## 2023-04-27 NOTE — Progress Notes (Signed)
Carlye Grippe, D.O.  ABFM, ABOM Specializing in Clinical Bariatric Medicine  Office located at: [address] [place], [place]  [address]     Assessment and Plan:  Pt will be seeing her endocrinologist and have labs done, she will ask to have a A1c and vitamin D level done and if not we will draw next OV.  Medications Discontinued During This Encounter  Medication Reason   HYDROcodone-acetaminophen (NORCO/VICODIN) 5-325 MG tablet    levocetirizine (XYZAL) 5 MG tablet    meloxicam (MOBIC) 15 MG tablet    predniSONE (DELTASONE) 10 MG tablet    nystatin cream (MYCOSTATIN)    Meds ordered this encounter  Medications   Semaglutide,0.25 or 0.5MG /DOS, (OZEMPIC, 0.25 OR 0.5 MG/DOSE,) 2 MG/3ML SOPN    Sig: 0.25 once wkly * 2 wks, then inc to 0.5 wkly    Pt has meds at home already    Type 2 diabetes mellitus with other circulatory complication, without long-term current use of insulin (HCC) Assessment: Condition is Not at goal.. Patient reports good compliance and tolerance of Jardiance and Metformin. Pt endorses having a increase in hunger and cravings for carbs and sugar. She was previously placed on Ozempic but had side effects so this was discontinued.  Lab Results  Component Value Date   HGBA1C 7.0 (H) 01/26/2023   HGBA1C 7.0 (A) 05/02/2022   HGBA1C 7.6 (A) 01/24/2022   INSULIN 7.5 03/11/2022    Plan: - Continue Metformin 500mg  and Jardiance 25mg .   - Begin Semaglutide 0.5mg  once weekly.    - Recommend that any concerns about medicines should be directed at the prescribing provider  - Recheck labs in 3 months if not done at Endo provider / PCP. Importance of f/up with PCP and all other specialists, as scheduled, was stressed to the patient today    Hypertension associated with type 2 diabetes mellitus (HCC) Assessment: Condition is Controlled.. Pt continues with ziac 5-6.25 mg daily, spironolactone 25 mg daily, and Lasix. Pt is denies any light-headiness or dizziness.   Last 3 blood pressure readings in our office are as follows: BP Readings from Last 3 Encounters:  04/27/23 101/68  03/23/23 105/73  02/26/23 124/70   Plan: - Continue with ziac 5-6.25 mg daily, spironolactone 25 mg daily, and Lasix prn.   - Autumn Massey BP is 101/68 at goal today.   - Lifestyle changes such as following our low salt, heart healthy meal plan and engaging in a regular exercise program discussed   - Ambulatory blood pressure monitoring encouraged.  2-3 days a week.   - We will continue to monitor closely alongside PCP/ specialists.  Pt reminded to also f/up with those individuals as instructed by them.    TREATMENT PLAN FOR OBESITY: BMI 50.0-59.9, adult (HCC)-current BMI 52.93 Morbid obesity (HCC): Starting BMI 54.4 Assessment:  Autumn Massey is here to discuss her progress with her obesity treatment plan along with follow-up of her obesity related diagnoses. See Medical Weight Management Flowsheet for complete bioelectrical impedance results.   Biometric data was not collected today because pt had her leg wrapped.   Total lbs lost to date: 8 Total weight loss percentage to date: 2.78%  Plan: - Autumn Massey will work on healthier eating habits and journaling within the recommended goals of 1400-1500 and 110+g of protein.   Behavioral Intervention Additional resources provided today:  food log Evidence-based interventions for health behavior change were utilized today including the discussion of self monitoring techniques, problem-solving barriers  and SMART goal setting techniques.   Regarding patient's less desirable eating habits and patterns, we employed the technique of small changes.  Pt will specifically work on: journal everything she eats for next visit.     She has agreed to Think about enjoyable ways to increase daily physical activity and overcoming barriers to exercise and Increase physical activity in their day and reduce sedentary time (increase  NEAT).   FOLLOW UP: Return in about 4 weeks (around 05/25/2023).  She was informed of the importance of frequent follow up visits to maximize her success with intensive lifestyle modifications for her multiple health conditions.  Subjective:   Chief complaint: Obesity Autumn Massey is here to discuss her progress with her obesity treatment plan. She is on the keeping a food journal and adhering to recommended goals of 1400-1500 calories and 110+ protein and states she is following her eating plan approximately 50-60% of the time. She states she has been lifting hand weights a few times this past month.   Interval History:  Autumn Massey is here for a follow up office visit.     Since last office visit:  She informed me that years ago she had hit her leg that caused a wound that did not heal well and had it wrapped for a extended time period. She has now hit this same spot again which began to ooze so she went back to her PCP who wrapped it to help healing. She will have this wrapped once a week for 4 weeks. She was sick with  respiratory infection for a week or so in October and was unable to journal 10/10. While sick she was able to workout and grocery shop. Pt endorses being stressed due to these obstacle hinder her exercise and what she could eat since last OV.  We reviewed her meal plan and all questions were answered.   Pharmacotherapy for weight loss: She is currently taking  Metformin  for medical weight loss.  Denies side effects.    Review of Systems:  Pertinent positives were addressed with patient today.  Reviewed by clinician on day of visit: allergies, medications, problem list, medical history, surgical history, family history, social history, and previous encounter notes.  Weight Summary and Biometrics   Weight Lost Since Last Visit: 1lb  Weight Gained Since Last Visit: 0lb   Vitals Temp: 98.9 F (37.2 C) BP: 101/68 Pulse Rate: 62 SpO2: 95 %   Anthropometric  Measurements Height: 5\' 1"  (1.549 m) Weight: 280 lb (127 kg) BMI (Calculated): 52.93 Weight at Last Visit: 281lb Weight Lost Since Last Visit: 1lb Weight Gained Since Last Visit: 0lb Starting Weight: 288lb Total Weight Loss (lbs): 8 lb (3.629 kg) Peak Weight: 367lb   No data recorded Other Clinical Data Fasting: no Labs: no Today's Visit #: 18 Starting Date: 03/11/22     Objective:   PHYSICAL EXAM: Blood pressure 101/68, pulse 62, temperature 98.9 F (37.2 C), height 5\' 1"  (1.549 m), weight 280 lb (127 kg), SpO2 95%. Body mass index is 52.91 kg/m.  General: Well Developed, well nourished, and in no acute distress.  HEENT: Normocephalic, atraumatic Skin: Warm and dry, cap RF less 2 sec, good turgor Chest:  Normal excursion, shape, no gross abn Respiratory: speaking in full sentences, no conversational dyspnea NeuroM-Sk: Ambulates w/o assistance, moves * 4 Psych: A and O *3, insight good, mood-full  DIAGNOSTIC DATA REVIEWED:  BMET    Component Value Date/Time   NA 135 01/26/2023 1503   NA  137 03/11/2022 1554   K 4.0 01/26/2023 1503   CL 97 01/26/2023 1503   CO2 28 01/26/2023 1503   GLUCOSE 100 (H) 01/26/2023 1503   BUN 24 (H) 01/26/2023 1503   BUN 11 03/11/2022 1554   CREATININE 0.56 01/26/2023 1503   CREATININE 0.73 03/02/2018 1157   CALCIUM 10.3 01/26/2023 1503   GFRNONAA >60 09/11/2022 0815   GFRNONAA 95 03/02/2018 1157   GFRAA >60 04/26/2019 1445   GFRAA 110 03/02/2018 1157   Lab Results  Component Value Date   HGBA1C 7.0 (H) 01/26/2023   HGBA1C 5.6 03/15/2012   Lab Results  Component Value Date   INSULIN 7.5 03/11/2022   Lab Results  Component Value Date   TSH 6.17 (H) 02/26/2023   CBC    Component Value Date/Time   WBC 9.6 01/26/2023 1503   RBC 5.52 (H) 01/26/2023 1503   HGB 17.2 (H) 01/26/2023 1503   HGB 19.6 (H) 03/11/2022 1554   HCT 52.7 (H) 01/26/2023 1503   HCT 57.0 (H) 03/11/2022 1554   PLT 167.0 01/26/2023 1503   PLT 167  03/11/2022 1554   MCV 95.3 01/26/2023 1503   MCV 92 03/11/2022 1554   MCH 30.3 09/11/2022 0815   MCHC 32.7 01/26/2023 1503   RDW 13.9 01/26/2023 1503   RDW 12.1 03/11/2022 1554   Iron Studies    Component Value Date/Time   IRON 93 01/26/2019 0959   FERRITIN 124.5 01/29/2009 0905   IRONPCTSAT 31.3 01/26/2019 0959   Lipid Panel     Component Value Date/Time   CHOL 156 01/26/2023 1503   CHOL 130 03/11/2022 1554   TRIG 281.0 (H) 01/26/2023 1503   HDL 38.50 (L) 01/26/2023 1503   HDL 34 (L) 03/11/2022 1554   CHOLHDL 4 01/26/2023 1503   VLDL 56.2 (H) 01/26/2023 1503   LDLCALC 54 03/11/2022 1554   LDLCALC 109 (H) 01/27/2020 1006   LDLDIRECT 79.0 01/26/2023 1503   Hepatic Function Panel     Component Value Date/Time   PROT 7.5 01/26/2023 1503   PROT 6.9 03/11/2022 1554   ALBUMIN 4.1 01/26/2023 1503   ALBUMIN 4.3 03/11/2022 1554   AST 19 01/26/2023 1503   ALT 23 01/26/2023 1503   ALKPHOS 64 01/26/2023 1503   BILITOT 0.7 01/26/2023 1503   BILITOT 0.7 03/11/2022 1554   BILIDIR 0.1 01/26/2023 1503   IBILI 0.4 01/27/2020 1006      Component Value Date/Time   TSH 6.17 (H) 02/26/2023 1559   Nutritional Lab Results  Component Value Date   VD25OH 47.22 01/26/2023   VD25OH 83.22 09/11/2022   VD25OH 39.5 03/11/2022    Attestations:   I, Clinical biochemist, acting as a Stage manager for [employer], DO., have compiled all relevant documentation for today's office visit on behalf of Thomasene Lot, DO, while in the presence of [employer], DO.  I have reviewed the above documentation for accuracy and completeness, and I agree with the above. Carlye Grippe, D.O.  The 21st Century Cures Act was signed into law in 2016 which includes the topic of electronic health records.  This provides immediate access to information in MyChart.  This includes consultation notes, operative notes, office notes, lab results and pathology reports.  If you have any questions about what  you read please let us know at your next visit so we can discuss your concerns and take corrective action if need be.  We are right here with you.

## 2023-04-28 NOTE — Progress Notes (Signed)
Odor After Cleansing: No Slough/Fibrino Yes Wound Bed Granulation Amount: Large (67-100%) Exposed Structure Granulation Quality: Red, Pink Fascia Exposed: No Necrotic Amount: Small (1-33%) Fat Layer (Subcutaneous Tissue) Exposed: Yes Necrotic Quality: Adherent Slough Tendon Exposed: No Muscle Exposed: No Joint Exposed: No Bone Exposed: No Periwound Skin Texture Texture Color No Abnormalities Noted: No No Abnormalities Noted: No Callus: No Atrophie Blanche: No Crepitus: No Cyanosis: No Excoriation: No Ecchymosis: No Induration: No Erythema: No Rash: No Hemosiderin Staining: Yes Scarring: No Mottled: No Pallor: No Moisture Rubor: No No Abnormalities Noted: No Dry / Scaly: No Temperature / Pain Maceration: No Temperature: No Abnormality Treatment Notes Wound #3 (Lower Leg) Wound Laterality: Right, Lateral Autumn Massey, Autumn Massey ([id number]) [id number] Page 10 of 10 Cleanser Vashe 5.8 (oz) Discharge Instruction: Cleanse the wound with Vashe prior to applying a clean dressing using gauze sponges, not tissue or cotton balls. Wound Cleanser Discharge Instruction:  Cleanse the wound with wound cleanser prior to applying a clean dressing using gauze sponges, not tissue or cotton balls. Peri-Wound Care Triamcinolone 15 (g) Discharge Instruction: Use triamcinolone 15 (g) as directed Sween Lotion (Moisturizing lotion) Discharge Instruction: Apply moisturizing lotion as directed Topical Primary Dressing Hydrofera Blue Ready Transfer Foam, 2.5x2.5 (in/in) Discharge Instruction: Apply directly to wound bed as directed Secondary Dressing ABD Pad, 5x9 Discharge Instruction: Apply over primary dressing as directed. Secured With Compression Wrap Urgo K2, (equivalent to a 4 layer) two layer compression system, regular Discharge Instruction: Apply Urgo K2 as directed (alternative to 4 layer compression). Compression Stockings Add-Ons Electronic Signature(s) Signed: 04/27/2023 4:03:55 PM By: Redmond Pulling RN, BSN Signed: 04/27/2023 6:15:51 PM By: Shawn Stall RN, BSN Entered By: Redmond Pulling on 04/27/2023 08:31:54 -------------------------------------------------------------------------------- Vitals Details Patient Name: Date of Service: Autumn Massey, [name] 04/27/2023 8:00 A M Medical Record Number: [id number] Patient Account Number: [id number] Date of Birth/Sex: Treating RN: [birth year] (57 y.o. Autumn Massey, Millard.Loa Primary Care Amritpal Shro[place]: Oliver Barre Other Clinician: Referring Rylan Bernard: Treating Wayne Brunker/Extender: Carley Hammed in Treatment: 0 Vital Signs Time Taken: 08:20 Temperature (F): 97.5 Height (in): 61 Pulse (bpm): 80 Source: Stated Respiratory Rate (breaths/min): 20 Weight (lbs): 278 Blood Pressure (mmHg): 102/69 Source: Stated Capillary Blood Glucose (mg/dl): 254 Body Mass Index (BMI): 52.5 Reference Range: 80 - 120 mg / dl Electronic Signature(s) Signed: 04/27/2023 6:15:51 PM By: Shawn Stall RN, BSN Entered By: Shawn Stall on 04/27/2023 08:24:20  Autumn Massey, Autumn Massey ([id number]) [id number] Page 1 of 10 Visit Report for 04/27/2023 Allergy List Details Patient Name: Date of Service: Autumn Massey, Autumn Massey. 04/27/2023 8:00 A M Medical Record Number: [id number] Patient Account Number: [id number] Date of Birth/Sex: Treating RN: 05/20/1966 (57 y.o. Autumn Massey Primary Care Yanelly Cantrelle: Oliver Barre Other Clinician: Referring Morgen Linebaugh: Treating Umaima Scholten/Extender: Carley Hammed in Treatment: 0 Allergies Active Allergies Cymbalta Reaction: unknown Allergy Notes Electronic Signature(s) Signed: 04/27/2023 6:15:51 PM By: Shawn Stall RN, BSN Entered By: Shawn Stall on 04/24/2023 13:27:52 -------------------------------------------------------------------------------- Arrival Information Details Patient Name: Date of Service: Autumn Massey, [name] 04/27/2023 8:00 A M Medical Record Number: [id number] Patient Account Number: [id number] Date of Birth/Sex: Treating RN: [birth year] (57 y.o. Autumn Massey, Millard.Loa Primary Care Renezmae Canlas: Oliver Barre Other Clinician: Referring Whittley Carandang: Treating Burma Ketcher/Extender: Carley Hammed in Treatment: 0 Visit Information Patient Arrived: Ambulatory Arrival Time: 08:22 Accompanied By: self Transfer Assistance: None Patient Identification Verified: Yes Secondary Verification Process Completed: Yes Patient Requires Transmission-Based Precautions: No Patient Has Alerts: No History Since Last Visit Added or deleted any medications: No Any new allergies or adverse reactions: No Had a fall or experienced change in activities of daily living that may affect risk of falls: No Signs or symptoms of abuse/neglect since last visito No Hospitalized since last visit: No Implantable device outside of the clinic excluding cellular tissue based products placed in the center since last visit: No Has Dressing in Place as Prescribed: Yes Has Compression in  Place as Prescribed: No Electronic Signature(s) Signed: 04/27/2023 6:15:51 PM By: Shawn Stall RN, BSN Entered By: Shawn Stall on 04/27/2023 08:22:56 Autumn Massey ([id number]) [id number] Page 2 of 10 -------------------------------------------------------------------------------- Clinic Level of Care Assessment Details Patient Name: Date of Service: Autumn Massey, Autumn Massey. 04/27/2023 8:00 A M Medical Record Number: [id number] Patient Account Number: [id number] Date of Birth/Sex: Treating RN: 1966/02/25 (57 y.o. Autumn Massey Primary Care Jakiah Goree: Oliver Barre Other Clinician: Referring Wilburn Keir: Treating Kentley Blyden/Extender: Carley Hammed in Treatment: 0 Clinic Level of Care Assessment Items TOOL 1 Quantity Score X- 1 0 Use when EandM and Procedure is performed on INITIAL visit ASSESSMENTS - Nursing Assessment / Reassessment X- 1 20 General Physical Exam (combine w/ comprehensive assessment (listed just below) when performed on new pt. evals) X- 1 25 Comprehensive Assessment (HX, ROS, Risk Assessments, Wounds Hx, etc.) ASSESSMENTS - Wound and Skin Assessment / Reassessment X- 1 10 Dermatologic / Skin Assessment (not related to wound area) ASSESSMENTS - Ostomy and/or Continence Assessment and Care []  - 0 Incontinence Assessment and Management []  - 0 Ostomy Care Assessment and Management (repouching, etc.) PROCESS - Coordination of Care []  - 0 Simple Patient / Family Education for ongoing care X- 1 20 Complex (extensive) Patient / Family Education for ongoing care X- 1 10 Staff obtains Chiropractor, Records, T Results / Process Orders est []  - 0 Staff telephones HHA, Nursing Homes / Clarify orders / etc []  - 0 Routine Transfer to another Facility (non-emergent condition) []  - 0 Routine Hospital Admission (non-emergent condition) X- 1 15 New Admissions / Manufacturing engineer / Ordering NPWT Apligraf, etc. , []  -  0 Emergency Hospital Admission (emergent condition) PROCESS - Special Needs []  - 0 Pediatric / Minor Patient Management []  - 0 Isolation Patient Management []  - 0 Hearing / Language / Visual special needs []  - 0 Assessment of Community assistance (transportation, Massey/C planning, etc.) []  - 0 Additional assistance / Altered mentation []  - 0 Support Surface(s)  Autumn Massey, Autumn Massey ([id number]) [id number] Page 1 of 10 Visit Report for 04/27/2023 Allergy List Details Patient Name: Date of Service: Autumn Massey, Autumn Massey. 04/27/2023 8:00 A M Medical Record Number: [id number] Patient Account Number: [id number] Date of Birth/Sex: Treating RN: 05/20/1966 (57 y.o. Autumn Massey Primary Care Yanelly Cantrelle: Oliver Barre Other Clinician: Referring Morgen Linebaugh: Treating Umaima Scholten/Extender: Carley Hammed in Treatment: 0 Allergies Active Allergies Cymbalta Reaction: unknown Allergy Notes Electronic Signature(s) Signed: 04/27/2023 6:15:51 PM By: Shawn Stall RN, BSN Entered By: Shawn Stall on 04/24/2023 13:27:52 -------------------------------------------------------------------------------- Arrival Information Details Patient Name: Date of Service: Autumn Massey, [name] 04/27/2023 8:00 A M Medical Record Number: [id number] Patient Account Number: [id number] Date of Birth/Sex: Treating RN: [birth year] (57 y.o. Autumn Massey, Millard.Loa Primary Care Renezmae Canlas: Oliver Barre Other Clinician: Referring Whittley Carandang: Treating Burma Ketcher/Extender: Carley Hammed in Treatment: 0 Visit Information Patient Arrived: Ambulatory Arrival Time: 08:22 Accompanied By: self Transfer Assistance: None Patient Identification Verified: Yes Secondary Verification Process Completed: Yes Patient Requires Transmission-Based Precautions: No Patient Has Alerts: No History Since Last Visit Added or deleted any medications: No Any new allergies or adverse reactions: No Had a fall or experienced change in activities of daily living that may affect risk of falls: No Signs or symptoms of abuse/neglect since last visito No Hospitalized since last visit: No Implantable device outside of the clinic excluding cellular tissue based products placed in the center since last visit: No Has Dressing in Place as Prescribed: Yes Has Compression in  Place as Prescribed: No Electronic Signature(s) Signed: 04/27/2023 6:15:51 PM By: Shawn Stall RN, BSN Entered By: Shawn Stall on 04/27/2023 08:22:56 Autumn Massey ([id number]) [id number] Page 2 of 10 -------------------------------------------------------------------------------- Clinic Level of Care Assessment Details Patient Name: Date of Service: Autumn Massey, Autumn Massey. 04/27/2023 8:00 A M Medical Record Number: [id number] Patient Account Number: [id number] Date of Birth/Sex: Treating RN: 1966/02/25 (57 y.o. Autumn Massey Primary Care Jakiah Goree: Oliver Barre Other Clinician: Referring Wilburn Keir: Treating Kentley Blyden/Extender: Carley Hammed in Treatment: 0 Clinic Level of Care Assessment Items TOOL 1 Quantity Score X- 1 0 Use when EandM and Procedure is performed on INITIAL visit ASSESSMENTS - Nursing Assessment / Reassessment X- 1 20 General Physical Exam (combine w/ comprehensive assessment (listed just below) when performed on new pt. evals) X- 1 25 Comprehensive Assessment (HX, ROS, Risk Assessments, Wounds Hx, etc.) ASSESSMENTS - Wound and Skin Assessment / Reassessment X- 1 10 Dermatologic / Skin Assessment (not related to wound area) ASSESSMENTS - Ostomy and/or Continence Assessment and Care []  - 0 Incontinence Assessment and Management []  - 0 Ostomy Care Assessment and Management (repouching, etc.) PROCESS - Coordination of Care []  - 0 Simple Patient / Family Education for ongoing care X- 1 20 Complex (extensive) Patient / Family Education for ongoing care X- 1 10 Staff obtains Chiropractor, Records, T Results / Process Orders est []  - 0 Staff telephones HHA, Nursing Homes / Clarify orders / etc []  - 0 Routine Transfer to another Facility (non-emergent condition) []  - 0 Routine Hospital Admission (non-emergent condition) X- 1 15 New Admissions / Manufacturing engineer / Ordering NPWT Apligraf, etc. , []  -  0 Emergency Hospital Admission (emergent condition) PROCESS - Special Needs []  - 0 Pediatric / Minor Patient Management []  - 0 Isolation Patient Management []  - 0 Hearing / Language / Visual special needs []  - 0 Assessment of Community assistance (transportation, Massey/C planning, etc.) []  - 0 Additional assistance / Altered mentation []  - 0 Support Surface(s)  Odor After Cleansing: No Slough/Fibrino Yes Wound Bed Granulation Amount: Large (67-100%) Exposed Structure Granulation Quality: Red, Pink Fascia Exposed: No Necrotic Amount: Small (1-33%) Fat Layer (Subcutaneous Tissue) Exposed: Yes Necrotic Quality: Adherent Slough Tendon Exposed: No Muscle Exposed: No Joint Exposed: No Bone Exposed: No Periwound Skin Texture Texture Color No Abnormalities Noted: No No Abnormalities Noted: No Callus: No Atrophie Blanche: No Crepitus: No Cyanosis: No Excoriation: No Ecchymosis: No Induration: No Erythema: No Rash: No Hemosiderin Staining: Yes Scarring: No Mottled: No Pallor: No Moisture Rubor: No No Abnormalities Noted: No Dry / Scaly: No Temperature / Pain Maceration: No Temperature: No Abnormality Treatment Notes Wound #3 (Lower Leg) Wound Laterality: Right, Lateral Autumn Massey, Autumn Massey ([id number]) [id number] Page 10 of 10 Cleanser Vashe 5.8 (oz) Discharge Instruction: Cleanse the wound with Vashe prior to applying a clean dressing using gauze sponges, not tissue or cotton balls. Wound Cleanser Discharge Instruction:  Cleanse the wound with wound cleanser prior to applying a clean dressing using gauze sponges, not tissue or cotton balls. Peri-Wound Care Triamcinolone 15 (g) Discharge Instruction: Use triamcinolone 15 (g) as directed Sween Lotion (Moisturizing lotion) Discharge Instruction: Apply moisturizing lotion as directed Topical Primary Dressing Hydrofera Blue Ready Transfer Foam, 2.5x2.5 (in/in) Discharge Instruction: Apply directly to wound bed as directed Secondary Dressing ABD Pad, 5x9 Discharge Instruction: Apply over primary dressing as directed. Secured With Compression Wrap Urgo K2, (equivalent to a 4 layer) two layer compression system, regular Discharge Instruction: Apply Urgo K2 as directed (alternative to 4 layer compression). Compression Stockings Add-Ons Electronic Signature(s) Signed: 04/27/2023 4:03:55 PM By: Redmond Pulling RN, BSN Signed: 04/27/2023 6:15:51 PM By: Shawn Stall RN, BSN Entered By: Redmond Pulling on 04/27/2023 08:31:54 -------------------------------------------------------------------------------- Vitals Details Patient Name: Date of Service: Autumn Massey, [name] 04/27/2023 8:00 A M Medical Record Number: [id number] Patient Account Number: [id number] Date of Birth/Sex: Treating RN: [birth year] (57 y.o. Autumn Massey, Millard.Loa Primary Care Amritpal [place]ro[place]: Oliver Barre Other Clinician: Referring Rylan Bernard: Treating Wayne Brunker/Extender: Carley Hammed in Treatment: 0 Vital Signs Time Taken: 08:20 Temperature (F): 97.5 Height (in): 61 Pulse (bpm): 80 Source: Stated Respiratory Rate (breaths/min): 20 Weight (lbs): 278 Blood Pressure (mmHg): 102/69 Source: Stated Capillary Blood Glucose (mg/dl): 254 Body Mass Index (BMI): 52.5 Reference Range: 80 - 120 mg / dl Electronic Signature(s) Signed: 04/27/2023 6:15:51 PM By: Shawn Stall RN, BSN Entered By: Shawn Stall on 04/27/2023 08:24:20  Autumn Massey, Autumn Massey ([id number]) [id number] Page 1 of 10 Visit Report for 04/27/2023 Allergy List Details Patient Name: Date of Service: Autumn Massey, Autumn Massey. 04/27/2023 8:00 A M Medical Record Number: [id number] Patient Account Number: [id number] Date of Birth/Sex: Treating RN: 05/20/1966 (57 y.o. Autumn Massey Primary Care Yanelly Cantrelle: Oliver Barre Other Clinician: Referring Morgen Linebaugh: Treating Umaima Scholten/Extender: Carley Hammed in Treatment: 0 Allergies Active Allergies Cymbalta Reaction: unknown Allergy Notes Electronic Signature(s) Signed: 04/27/2023 6:15:51 PM By: Shawn Stall RN, BSN Entered By: Shawn Stall on 04/24/2023 13:27:52 -------------------------------------------------------------------------------- Arrival Information Details Patient Name: Date of Service: Autumn Massey, [name] 04/27/2023 8:00 A M Medical Record Number: [id number] Patient Account Number: [id number] Date of Birth/Sex: Treating RN: [birth year] (57 y.o. Autumn Massey, Millard.Loa Primary Care Renezmae Canlas: Oliver Barre Other Clinician: Referring Whittley Carandang: Treating Burma Ketcher/Extender: Carley Hammed in Treatment: 0 Visit Information Patient Arrived: Ambulatory Arrival Time: 08:22 Accompanied By: self Transfer Assistance: None Patient Identification Verified: Yes Secondary Verification Process Completed: Yes Patient Requires Transmission-Based Precautions: No Patient Has Alerts: No History Since Last Visit Added or deleted any medications: No Any new allergies or adverse reactions: No Had a fall or experienced change in activities of daily living that may affect risk of falls: No Signs or symptoms of abuse/neglect since last visito No Hospitalized since last visit: No Implantable device outside of the clinic excluding cellular tissue based products placed in the center since last visit: No Has Dressing in Place as Prescribed: Yes Has Compression in  Place as Prescribed: No Electronic Signature(s) Signed: 04/27/2023 6:15:51 PM By: Shawn Stall RN, BSN Entered By: Shawn Stall on 04/27/2023 08:22:56 Autumn Massey ([id number]) [id number] Page 2 of 10 -------------------------------------------------------------------------------- Clinic Level of Care Assessment Details Patient Name: Date of Service: Autumn Massey, Autumn Massey. 04/27/2023 8:00 A M Medical Record Number: [id number] Patient Account Number: [id number] Date of Birth/Sex: Treating RN: 1966/02/25 (57 y.o. Autumn Massey Primary Care Jakiah Goree: Oliver Barre Other Clinician: Referring Wilburn Keir: Treating Kentley Blyden/Extender: Carley Hammed in Treatment: 0 Clinic Level of Care Assessment Items TOOL 1 Quantity Score X- 1 0 Use when EandM and Procedure is performed on INITIAL visit ASSESSMENTS - Nursing Assessment / Reassessment X- 1 20 General Physical Exam (combine w/ comprehensive assessment (listed just below) when performed on new pt. evals) X- 1 25 Comprehensive Assessment (HX, ROS, Risk Assessments, Wounds Hx, etc.) ASSESSMENTS - Wound and Skin Assessment / Reassessment X- 1 10 Dermatologic / Skin Assessment (not related to wound area) ASSESSMENTS - Ostomy and/or Continence Assessment and Care []  - 0 Incontinence Assessment and Management []  - 0 Ostomy Care Assessment and Management (repouching, etc.) PROCESS - Coordination of Care []  - 0 Simple Patient / Family Education for ongoing care X- 1 20 Complex (extensive) Patient / Family Education for ongoing care X- 1 10 Staff obtains Chiropractor, Records, T Results / Process Orders est []  - 0 Staff telephones HHA, Nursing Homes / Clarify orders / etc []  - 0 Routine Transfer to another Facility (non-emergent condition) []  - 0 Routine Hospital Admission (non-emergent condition) X- 1 15 New Admissions / Manufacturing engineer / Ordering NPWT Apligraf, etc. , []  -  0 Emergency Hospital Admission (emergent condition) PROCESS - Special Needs []  - 0 Pediatric / Minor Patient Management []  - 0 Isolation Patient Management []  - 0 Hearing / Language / Visual special needs []  - 0 Assessment of Community assistance (transportation, Massey/C planning, etc.) []  - 0 Additional assistance / Altered mentation []  - 0 Support Surface(s)

## 2023-04-28 NOTE — Progress Notes (Signed)
BYRON, FUESS D ([id number]) [id number] Page 1 of 4 Visit Report for 04/27/2023 Abuse Risk Screen Details Patient Name: Date of Service: Autumn Massey, Autumn Massey NDA D. 04/27/2023 8:00 A M Medical Record Number: [id number] Patient Account Number: [id number] Date of Birth/Sex: Treating RN: 06/18/1966 (57 y.o. Autumn Massey, Millard.Loa Primary Care Trai Ells: Oliver Barre Other Clinician: Referring Glada Wickstrom: Treating Denielle Bayard/Extender: Carley Hammed in Treatment: 0 Abuse Risk Screen Items Answer ABUSE RISK SCREEN: Has anyone close to you tried to hurt or harm you recentlyo No Do you feel uncomfortable with anyone in your familyo No Has anyone forced you do things that you didnt want to doo No Electronic Signature(s) Signed: 04/27/2023 6:15:51 PM By: Shawn Stall RN, BSN Entered By: Shawn Stall on 04/27/2023 05:24:33 -------------------------------------------------------------------------------- Activities of Daily Living Details Patient Name: Date of Service: Autumn Massey [facility], Autumn Massey NDA D. 04/27/2023 8:00 A M Medical Record Number: [id number] Patient Account Number: [id number] Date of Birth/Sex: Treating RN: May 23, 1966 (57 y.o. Autumn Massey Primary Care Katalea Ucci: Oliver Barre Other Clinician: Referring Burt Piatek: Treating Isauro Skelley/Extender: Carley Hammed in Treatment: 0 Activities of Daily Living Items Answer Activities of Daily Living (Please select one for each item) Drive Automobile Completely Able T Medications ake Completely Able Use T elephone Completely Able Care for Appearance Completely Able Use T oilet Completely Able Bath / Shower Completely Able Dress Self Completely Able Feed Self Completely Able Walk Completely Able Get In / Out Bed Completely Able Housework Completely Able Prepare Meals Completely Able Handle Money Completely Able Shop for Self Completely Able Electronic Signature(s) Signed: 04/27/2023  6:15:51 PM By: Shawn Stall RN, BSN Entered By: Shawn Stall on 04/27/2023 05:24:48 Hinchcliff, Shuntel D ([id number]) [id number] Page 2 of 4 -------------------------------------------------------------------------------- Education Screening Details Patient Name: Date of Service: [facility], Autumn Massey NDA D. 04/27/2023 8:00 A M Medical Record Number: [id number] Patient Account Number: [id number] Date of Birth/Sex: Treating RN: [birth year] (57 y.o. Autumn Massey, Millard.Loa Primary Care Ardith Test: Oliver Barre Other Clinician: Referring Hjalmar Ballengee: Treating Takoda Siedlecki/Extender: Carley Hammed in Treatment: 0 Primary Learner Assessed: Patient Learning Preferences/Education Level/Primary Language Learning Preference: Explanation, Demonstration, Printed Material Highest Education Level: High School Preferred Language: English Cognitive Barrier Language Barrier: No Translator Needed: No Memory Deficit: No Emotional Barrier: No Cultural/Religious Beliefs Affecting Medical Care: No Physical Barrier Impaired Vision: Yes Glasses Impaired Hearing: Yes HOH Decreased Hand dexterity: No Knowledge/Comprehension Knowledge Level: High Comprehension Level: High Ability to understand written instructions: High Ability to understand verbal instructions: High Motivation Anxiety Level: Calm Cooperation: Cooperative Education Importance: Acknowledges Need Interest in Health Problems: Asks Questions Perception: Coherent Willingness to Engage in Self-Management High Activities: Readiness to Engage in Self-Management High Activities: Electronic Signature(s) Signed: 04/27/2023 6:15:51 PM By: Shawn Stall RN, BSN Entered By: Shawn Stall on 04/27/2023 05:25:16 -------------------------------------------------------------------------------- Fall Risk Assessment Details Patient Name: Date of Service: Autumn Massey, [name] 04/27/2023 8:00 A M Medical Record Number:  [id number] Patient Account Number: [id number] Date of Birth/Sex: Treating RN: [birth year] (57 y.o. Autumn Massey Primary Care Danaysha Kirn: Oliver Barre Other Clinician: Referring Mann Skaggs: Treating Trystyn Dolley/Extender: Carley Hammed in Treatment: 0 Fall Risk Assessment Items Have you had 2 or more falls in the last 12 monthso 0 No ZAILEY, YEAROUT D ([id number]) [phone] [id number] Page 3 of 4 Have you had any fall that resulted in injury in the last 12 monthso 0 No FALLS RISK SCREEN History of falling - immediate or within 3 months 0 No Secondary diagnosis (Do you have 2 or more medical  diagnoseso) 0 No Ambulatory aid None/bed rest/wheelchair/nurse 0 Yes Crutches/cane/walker 0 No Furniture 0 No Intravenous therapy Access/Saline/Heparin Lock 0 No Gait/Transferring Normal/ bed rest/ wheelchair 0 Yes Weak (short steps with or without shuffle, stooped but able to lift head while walking, may seek 0 No support from furniture) Impaired (short steps with shuffle, may have difficulty arising from chair, head down, impaired 0 No balance) Mental Status Oriented to own ability 0 Yes Electronic Signature(s) Signed: 04/27/2023 6:15:51 PM By: Shawn Stall RN, BSN Entered By: Shawn Stall on 04/27/2023 05:25:24 -------------------------------------------------------------------------------- Foot Assessment Details Patient Name: Date of Service: Autumn Massey, [name] 04/27/2023 8:00 A M Medical Record Number: [id number] Patient Account Number: [id number] Date of Birth/Sex: Treating RN: 1966-02-15 (57 y.o. Autumn Massey Primary Care Jacier Gladu: Oliver Barre Other Clinician: Referring Rose Hegner: Treating Shamonique Battiste/Extender: Carley Hammed in Treatment: 0 Foot Assessment Items Site Locations + = Sensation present, - = Sensation absent, C = Callus, U = Ulcer R = Redness, W = Warmth, M = Maceration, PU = Pre-ulcerative lesion F =  Fissure, S = Swelling, D = Dryness Assessment Right: Left: Other Deformity: No No Prior Foot Ulcer: No No Prior Amputation: No No Charcot Joint: No No Ambulatory Status: Ambulatory Without Help GaitAVANTHIKA, IRVAN D ([id number]) [phone].pdf Page 4 of 4 Electronic Signature(s) Signed: 04/27/2023 6:15:51 PM By: Shawn Stall RN, BSN Entered By: Shawn Stall on 04/27/2023 05:31:35 -------------------------------------------------------------------------------- Nutrition Risk Screening Details Patient Name: Date of Service: [facility], [name] 04/27/2023 8:00 A M Medical Record Number: [id number] Patient Account Number: [id number] Date of Birth/Sex: Treating RN: 04/22/1966 (57 y.o. Autumn Massey, Millard.Loa Primary Care Jermari Tamargo: Oliver Barre Other Clinician: Referring Joyce Leckey: Treating Jacques Willingham/Extender: Carley Hammed in Treatment: 0 Height (in): 61 Weight (lbs): 278 Body Mass Index (BMI): 52.5 Nutrition Risk Screening Items Score Screening NUTRITION RISK SCREEN: I have an illness or condition that made me change the kind and/or amount of food I eat 2 Yes I eat fewer than two meals per day 0 No I eat few fruits and vegetables, or milk products 0 No I have three or more drinks of beer, liquor or wine almost every day 0 No I have tooth or mouth problems that make it hard for me to eat 0 No I don't always have enough money to buy the food I need 0 No I eat alone most of the time 0 No I take three or more different prescribed or over-the-counter drugs a day 1 Yes Without wanting to, I have lost or gained 10 pounds in the last six months 0 No I am not always physically able to shop, cook and/or feed myself 0 No Nutrition Protocols Good Risk Protocol Provide education on elevated blood Moderate Risk Protocol 0 sugars and impact on wound healing, as applicable High Risk Proctocol Risk Level: Moderate Risk Score: 3 Electronic  Signature(s) Signed: 04/27/2023 6:15:51 PM By: Shawn Stall RN, BSN Entered By: Shawn Stall on 04/27/2023 05:25:39

## 2023-04-28 NOTE — Progress Notes (Signed)
By: Baltazar Najjar MD Entered By: Baltazar Najjar on 04/27/2023 05:49:05 Autumn Massey ([id number]) [id number] Page 5 of 9 -------------------------------------------------------------------------------- Progress Note Details Patient Name: Date of Service: Autumn Massey, Autumn Massey NDA Massey. 04/27/2023 8:00 A M Medical Record Number: [id number] Patient Account Number: [id number] Date of Birth/Sex: Treating RN: [birth year] (57 y.o. F) Primary Care Provider: Oliver Barre Other Clinician: Referring Provider: Treating Provider/Extender: Carley Hammed in Treatment: 0 Subjective Chief Complaint Information obtained from Patient 12/14/2020; patient is here for review of the wound on the right lateral lower leg which has been present for roughly 4 months 04/27/2023; patient returns to clinic with a wound on the right lateral lower leg in the similar position to last time History of Present Illness (HPI) ADMISSION 12/14/2020 This is a pleasant 57 year old woman who is here for our review of wound on the right lateral calf. She is a type II diabetic. She said this started when she traumatized her leg hitting it on the bed frame in February. This was painful. She was left with open wounds. She initially used topical antibiotics but found this was not happening more recently she has been using a combination of diluted hydrogen peroxide, and antibiotic pads. She has support hose but does not  wear them. The patient complains of a lot of pain sometimes waking her up tonight at night with a sharp jabbing discomfort. She has neuropathy but this may not be secondary to her diabetes rather idiopathic peripheral neuropathy is also listed on her problem list. Past medical history includes type 2 diabetes not on insulin last hemoglobin A1c of 8, COVID in February 2022 with chronic shortness of breath. At 1 point she was listed as having COPD although she thinks this is more asthma, hypothyroidism, hyperlipidemia, nighttime oxygen, essential tremor hearing loss and hypertension. She had a lower extremity DVT study on 10/03/2020 and although it was a limited study because of pannus folds over her upper thighs there was no specific evidence of DVT on the right side. ABI in our clinic on the right was 0.94 6/24; patient be admitted to the clinic last week. She had 2 areas on her right lateral lower leg. Substantial amount of erythema around both of these wounds which could have been cellulitis or contact dermatitis. I favor the latter but covered her with antibiotics in any case. She arrives in clinic today things look a lot better especially her skin. Wounds are smaller. We use silver alginate 6/29; the patient is down to 1 area on the right lateral lower leg. The erythema around this is totally resolved this was likely a contact dermatitis. I been using silver alginate 7/6; wound on the right lateral lower leg is much smaller. No erythema. Her edema control is excellent 7/13; wound on the right lower leg has epithelialized but still vulnerable especially at the superior aspect. We have been using Sorbact although I do not think Sorbact is going to be necessary here. She tells that she gets her disability next Wednesday. Will not be able to order her stockings from [employer] until then. 7/21; the area on the right posterior calf is fully epithelialized. She says she is played "phone tag" with  elastic therapy she does not have new compression stockings she brings in a pair of stockings that are probably support hose and I am not really sure how old these are. READMISSION 04/27/2023 Patient is now 57 years old. She presents with a 4-week history of a wound on the right  6/24; patient be admitted to the clinic last week. She had 2 areas on her right lateral lower leg. Substantial amount of erythema around both of these wounds which could have been cellulitis or contact dermatitis. I favor the latter but covered her with antibiotics in any case. She arrives in clinic today things look a lot better especially her skin. Wounds are smaller. We use silver alginate 6/29; the patient is down to 1 area on the right lateral lower leg. The erythema around this is totally resolved this was likely a contact dermatitis. I been using silver alginate 7/6; wound on the right lateral lower leg is much smaller. No erythema. Her edema control is excellent 7/13; wound on the right lower leg has epithelialized but still vulnerable especially at the superior aspect. We have been using Sorbact although I do not think Sorbact is going to be necessary here. She tells that she gets her disability next Wednesday. Will not be able to order her stockings from [employer] until then. 7/21; the area on the right posterior calf is fully epithelialized. She says she is played "phone tag" with elastic therapy she does not have new compression stockings she brings in a pair of stockings that are probably support hose and I am not really sure how old these are. READMISSION 04/27/2023 Patient is now 57 years old. She presents with a 4-week history of a wound on the right lateral lower leg in a very similar position to last time. There is no history of trauma she says she simply looked down and felt a sensation in the same area with leaking fluid. It took 3 weeks for her to get an appointment  here. She has not been wearing compression stockings reliably since the last time she was here 2 years ago. Past medical history includes type 2 diabetes, hypertension, essential tremor, cervical spine disease, hypothyroidism. I do not believe she has had venous reflux studies but she had to DVT rule outs in 2017 and 2022 that did not show any acute DVT She is on Lasix 40 mg a day I think for lower extremity edema . ABI in our clinic was 1.18 on the right Electronic Signature(s) Signed: 04/28/2023 8:53:04 AM By: Baltazar Najjar MD Entered By: Baltazar Najjar on 04/27/2023 05:51:30 -------------------------------------------------------------------------------- Physical Exam Details Patient Name: Date of Service: Autumn Massey, [name] 04/27/2023 8:00 A M Medical Record Number: [id number] Patient Account Number: [id number] Date of Birth/Sex: Treating RN: 1966/03/29 (57 y.o. F) Primary Care Provider: Oliver Barre Other Clinician: Referring Provider: Treating Provider/Extender: Carley Hammed in Treatment: 0 [place], [place] Massey ([id number]) [id number] Page 3 of 9 Constitutional Sitting or standing Blood Pressure is within target range for patient.. Pulse regular and within target range for patient.[name] Respirations regular, non-labored and within target range.. Temperature is normal and within the target range for the patient.[name] Appears in no distress. Respiratory work of breathing is normal. Bilateral breath sounds are clear and equal in all lobes with no wheezes, rales or rhonchi.. Cardiovascular Heart sounds are distant but no murmurs. Pedal pulses are easily palpable in the right foot. Hemosiderin deposition in the right greater than left lower leg. She says her right leg is always larger no evidence of anything acute here. Evidence of stasis dermatitis but no palpable tenderness. Notes Wound exam; right lateral lower leg about the size of a quarter.  Perhaps 2 mm of depth healthy looking granulation. No need for debridement. She has erythema around the area but no  compression system, regular 1 x Per Week/30 Days Discharge Instructions: Apply Urgo K2 as directed (alternative to 4 layer compression). 1. We are going to use TCA and moisturizer to the right leg 2. Hydrofera Blue ABDs under 4-layer compression 3. The patient is used to this from her previous stay in the clinic 2 years ago 4. She is not been wearing compression stockings I explained to her that this is going to be necessary indefinitely. 5. She has not had venous reflux studies and I certainly would consider this if she is back quickly with a reopening and she is compliant with compression stockings. 6. The patient tells me that she is lost 60 pounds going to a weight loss program. She wants to do an abdominoplasty, but apparently has to lose some more weight in order to qualify. Electronic Signature(s) Signed: 04/28/2023 8:53:04 AM By: Baltazar Najjar MD Entered By: Baltazar Najjar on 04/27/2023 05:55:16 -------------------------------------------------------------------------------- HxROS Details Patient Name: Date of Service: Autumn Massey, [name] 04/27/2023 8:00 A M Medical Record Number: [id number] Patient Account Number: [id number] Date of Birth/Sex: Treating RN: Jul 11, 1965 (57 y.o. [name] Primary Care Provider: Oliver Barre Other  Clinician: Referring Provider: Treating Provider/Extender: Carley Hammed in Treatment: 0 [place], [place] Massey ([id number]) [id number].pdf Page 8 of 9 Information Obtained From Patient Constitutional Symptoms (General Health) Medical History: Past Medical History Notes: morbid obesity Eyes Medical History: Negative for: Cataracts; Glaucoma; Optic Neuritis Ear/Nose/Mouth/Throat Medical History: Negative for: Chronic sinus problems/congestion; Middle ear problems Past Medical History Notes: hearing impaired Hematologic/Lymphatic Medical History: Past Medical History Notes: polycythemia Respiratory Medical History: Positive for: Asthma; Chronic Obstructive Pulmonary Disease (COPD) Past Medical History Notes: O2 dependent at night Cardiovascular Medical History: Positive for: Congestive Heart Failure; Hypertension; Peripheral Venous Disease Negative for: Peripheral Arterial Disease; Phlebitis; Vasculitis Past Medical History Notes: hyperlipidemia Endocrine Medical History: Positive for: Type II Diabetes - 7 HgbA1c Negative for: Type I Diabetes Past Medical History Notes: hypothyroidism Time with diabetes: 6 yrs Treated with: Oral agents Blood sugar tested every day: Yes Tested : 2 times per day Integumentary (Skin) Medical History: Negative for: History of Burn Musculoskeletal Medical History: Positive for: Osteoarthritis Negative for: Gout; Rheumatoid Arthritis; Osteomyelitis Neurologic Medical History: Positive for: Neuropathy Past Medical History Notes: tremors Oncologic Medical History: Negative for: Received Chemotherapy; Received Radiation Psychiatric Medical History: Negative for: Wyn Quaker MICHALA, ORLOSKY Massey ([id number]) [id number] Page 9 of 9 Immunizations Pneumococcal Vaccine: Received Pneumococcal Vaccination: No Implantable  Devices None Hospitalization / Surgery History Type of Hospitalization/Surgery DandC/hysterescopy tonsillectomy ulnar tunnel release Family and Social History Cancer: Yes - Mother; Diabetes: No; Heart Disease: Yes - Mother; Hypertension: Yes - Mother,Maternal Grandparents; Kidney Disease: No; Lung Disease: Yes - Mother; Seizures: No; Stroke: Yes - Mother; Thyroid Problems: Yes - Maternal Grandparents; Tuberculosis: No; Current every day smoker - 1/2ppd; Marital Status - Divorced; Alcohol Use: Rarely; Drug Use: No History; Caffeine Use: Rarely; Financial Concerns: No; Food, Clothing or Shelter Needs: No; Support System Lacking: No; Transportation Concerns: No Psychologist, prison and probation services) Signed: 04/27/2023 6:15:51 PM By: Shawn Stall RN, BSN Signed: 04/28/2023 8:53:04 AM By: Baltazar Najjar MD Entered By: Shawn Stall on 04/24/2023 10:31:14 -------------------------------------------------------------------------------- SuperBill Details Patient Name: Date of Service: Autumn Massey, [name] 04/27/2023 Medical Record Number: [id number] Patient Account Number: [id number] Date of Birth/Sex: Treating RN: [birth year] (57 y.o. [name] Primary Care Provider: Oliver Barre Other Clinician: Referring Provider: Treating Provider/Extender: Carley Hammed in Treatment: 0 Diagnosis Coding ICD-10 Codes Code Description [phone]  BIRD, CRYMES Massey ([id number]) [id number] Page 1 of 9 Visit Report for 04/27/2023 Chief Complaint Document Details Patient Name: Date of Service: Autumn Massey, Autumn Massey NDA Massey. 04/27/2023 8:00 A M Medical Record Number: [id number] Patient Account Number: [id number] Date of Birth/Sex: Treating RN: 1966-06-20 (57 y.o. F) Primary Care Provider: Oliver Barre Other Clinician: Referring Provider: Treating Provider/Extender: Carley Hammed in Treatment: 0 Information Obtained from: Patient Chief Complaint 12/14/2020; patient is here for review of the wound on the right lateral lower leg which has been present for roughly 4 months 04/27/2023; patient returns to clinic with a wound on the right lateral lower leg in the similar position to last time Electronic Signature(s) Signed: 04/28/2023 8:53:04 AM By: Baltazar Najjar MD Entered By: Baltazar Najjar on 04/27/2023 05:49:49 -------------------------------------------------------------------------------- Debridement Details Patient Name: Date of Service: Autumn Massey, [name] 04/27/2023 8:00 A M Medical Record Number: [id number] Patient Account Number: [id number] Date of Birth/Sex: Treating RN: [birth year] (57 y.o. F) Primary Care Provider: Oliver Barre Other Clinician: Referring Provider: Treating Provider/Extender: Carley Hammed in Treatment: 0 Debridement Performed for Assessment: Wound #3 Right,Lateral Lower Leg Performed By: Clinician Shawn Stall, RN Debridement Type: Chemical/Enzymatic/Mechanical Agent Used: gauze and wound cleanser Severity of Tissue Pre Debridement: Fat layer exposed Level of Consciousness (Pre-procedure): Awake and Alert Pre-procedure Verification/Time Out No Taken: Percent of Wound Bed Debrided: Bleeding: None Response to Treatment: Procedure was tolerated well Level of Consciousness (Post- Awake and Alert procedure): Post Debridement Measurements of Total  Wound Length: (cm) 1.7 Width: (cm) 1.2 Depth: (cm) 0.2 Volume: (cm) 0.32 Character of Wound/Ulcer Post Debridement: Improved Severity of Tissue Post Debridement: Fat layer exposed Post Procedure Diagnosis Same as Pre-procedure Electronic Signature(s) Signed: 04/28/2023 8:53:04 AM By: Baltazar Najjar MD [place], [place] Massey ([id number]) [phone].pdf Page 2 of 9 Entered By: Baltazar Najjar on 04/27/2023 05:49:20 -------------------------------------------------------------------------------- HPI Details Patient Name: Date of Service: SEELINGER, Autumn Massey NDA Massey. 04/27/2023 8:00 A M Medical Record Number: [id number] Patient Account Number: [id number] Date of Birth/Sex: Treating RN: [birth year] (56 y.o. F) Primary Care Provider: Oliver Barre Other Clinician: Referring Provider: Treating Provider/Extender: Carley Hammed in Treatment: 0 History of Present Illness HPI Description: ADMISSION 12/14/2020 This is a pleasant 57 year old woman who is here for our review of wound on the right lateral calf. She is a type II diabetic. She said this started when she traumatized her leg hitting it on the bed frame in February. This was painful. She was left with open wounds. She initially used topical antibiotics but found this was not happening more recently she has been using a combination of diluted hydrogen peroxide, and antibiotic pads. She has support hose but does not wear them. The patient complains of a lot of pain sometimes waking her up tonight at night with a sharp jabbing discomfort. She has neuropathy but this may not be secondary to her diabetes rather idiopathic peripheral neuropathy is also listed on her problem list. Past medical history includes type 2 diabetes not on insulin last hemoglobin A1c of 8, COVID in February 2022 with chronic shortness of breath. At 1 point she was listed as having COPD although she thinks this is more asthma, hypothyroidism,  hyperlipidemia, nighttime oxygen, essential tremor hearing loss and hypertension. She had a lower extremity DVT study on 10/03/2020 and although it was a limited study because of pannus folds over her upper thighs there was no specific evidence of DVT on the right side. ABI in our clinic on the right was 0.94  compression system, regular 1 x Per Week/30 Days Discharge Instructions: Apply Urgo K2 as directed (alternative to 4 layer compression). 1. We are going to use TCA and moisturizer to the right leg 2. Hydrofera Blue ABDs under 4-layer compression 3. The patient is used to this from her previous stay in the clinic 2 years ago 4. She is not been wearing compression stockings I explained to her that this is going to be necessary indefinitely. 5. She has not had venous reflux studies and I certainly would consider this if she is back quickly with a reopening and she is compliant with compression stockings. 6. The patient tells me that she is lost 60 pounds going to a weight loss program. She wants to do an abdominoplasty, but apparently has to lose some more weight in order to qualify. Electronic Signature(s) Signed: 04/28/2023 8:53:04 AM By: Baltazar Najjar MD Entered By: Baltazar Najjar on 04/27/2023 05:55:16 -------------------------------------------------------------------------------- HxROS Details Patient Name: Date of Service: Autumn Massey, [name] 04/27/2023 8:00 A M Medical Record Number: [id number] Patient Account Number: [id number] Date of Birth/Sex: Treating RN: Jul 11, 1965 (57 y.o. [name] Primary Care Provider: Oliver Barre Other  Clinician: Referring Provider: Treating Provider/Extender: Carley Hammed in Treatment: 0 [place], [place] Massey ([id number]) [id number].pdf Page 8 of 9 Information Obtained From Patient Constitutional Symptoms (General Health) Medical History: Past Medical History Notes: morbid obesity Eyes Medical History: Negative for: Cataracts; Glaucoma; Optic Neuritis Ear/Nose/Mouth/Throat Medical History: Negative for: Chronic sinus problems/congestion; Middle ear problems Past Medical History Notes: hearing impaired Hematologic/Lymphatic Medical History: Past Medical History Notes: polycythemia Respiratory Medical History: Positive for: Asthma; Chronic Obstructive Pulmonary Disease (COPD) Past Medical History Notes: O2 dependent at night Cardiovascular Medical History: Positive for: Congestive Heart Failure; Hypertension; Peripheral Venous Disease Negative for: Peripheral Arterial Disease; Phlebitis; Vasculitis Past Medical History Notes: hyperlipidemia Endocrine Medical History: Positive for: Type II Diabetes - 7 HgbA1c Negative for: Type I Diabetes Past Medical History Notes: hypothyroidism Time with diabetes: 6 yrs Treated with: Oral agents Blood sugar tested every day: Yes Tested : 2 times per day Integumentary (Skin) Medical History: Negative for: History of Burn Musculoskeletal Medical History: Positive for: Osteoarthritis Negative for: Gout; Rheumatoid Arthritis; Osteomyelitis Neurologic Medical History: Positive for: Neuropathy Past Medical History Notes: tremors Oncologic Medical History: Negative for: Received Chemotherapy; Received Radiation Psychiatric Medical History: Negative for: Wyn Quaker MICHALA, ORLOSKY Massey ([id number]) [id number] Page 9 of 9 Immunizations Pneumococcal Vaccine: Received Pneumococcal Vaccination: No Implantable  Devices None Hospitalization / Surgery History Type of Hospitalization/Surgery DandC/hysterescopy tonsillectomy ulnar tunnel release Family and Social History Cancer: Yes - Mother; Diabetes: No; Heart Disease: Yes - Mother; Hypertension: Yes - Mother,Maternal Grandparents; Kidney Disease: No; Lung Disease: Yes - Mother; Seizures: No; Stroke: Yes - Mother; Thyroid Problems: Yes - Maternal Grandparents; Tuberculosis: No; Current every day smoker - 1/2ppd; Marital Status - Divorced; Alcohol Use: Rarely; Drug Use: No History; Caffeine Use: Rarely; Financial Concerns: No; Food, Clothing or Shelter Needs: No; Support System Lacking: No; Transportation Concerns: No Psychologist, prison and probation services) Signed: 04/27/2023 6:15:51 PM By: Shawn Stall RN, BSN Signed: 04/28/2023 8:53:04 AM By: Baltazar Najjar MD Entered By: Shawn Stall on 04/24/2023 10:31:14 -------------------------------------------------------------------------------- SuperBill Details Patient Name: Date of Service: Autumn Massey, [name] 04/27/2023 Medical Record Number: [id number] Patient Account Number: [id number] Date of Birth/Sex: Treating RN: [birth year] (57 y.o. [name] Primary Care Provider: Oliver Barre Other Clinician: Referring Provider: Treating Provider/Extender: Carley Hammed in Treatment: 0 Diagnosis Coding ICD-10 Codes Code Description [phone]  compression system, regular 1 x Per Week/30 Days Discharge Instructions: Apply Urgo K2 as directed (alternative to 4 layer compression). 1. We are going to use TCA and moisturizer to the right leg 2. Hydrofera Blue ABDs under 4-layer compression 3. The patient is used to this from her previous stay in the clinic 2 years ago 4. She is not been wearing compression stockings I explained to her that this is going to be necessary indefinitely. 5. She has not had venous reflux studies and I certainly would consider this if she is back quickly with a reopening and she is compliant with compression stockings. 6. The patient tells me that she is lost 60 pounds going to a weight loss program. She wants to do an abdominoplasty, but apparently has to lose some more weight in order to qualify. Electronic Signature(s) Signed: 04/28/2023 8:53:04 AM By: Baltazar Najjar MD Entered By: Baltazar Najjar on 04/27/2023 05:55:16 -------------------------------------------------------------------------------- HxROS Details Patient Name: Date of Service: Autumn Massey, [name] 04/27/2023 8:00 A M Medical Record Number: [id number] Patient Account Number: [id number] Date of Birth/Sex: Treating RN: Jul 11, 1965 (57 y.o. [name] Primary Care Provider: Oliver Barre Other  Clinician: Referring Provider: Treating Provider/Extender: Carley Hammed in Treatment: 0 [place], [place] Massey ([id number]) [id number].pdf Page 8 of 9 Information Obtained From Patient Constitutional Symptoms (General Health) Medical History: Past Medical History Notes: morbid obesity Eyes Medical History: Negative for: Cataracts; Glaucoma; Optic Neuritis Ear/Nose/Mouth/Throat Medical History: Negative for: Chronic sinus problems/congestion; Middle ear problems Past Medical History Notes: hearing impaired Hematologic/Lymphatic Medical History: Past Medical History Notes: polycythemia Respiratory Medical History: Positive for: Asthma; Chronic Obstructive Pulmonary Disease (COPD) Past Medical History Notes: O2 dependent at night Cardiovascular Medical History: Positive for: Congestive Heart Failure; Hypertension; Peripheral Venous Disease Negative for: Peripheral Arterial Disease; Phlebitis; Vasculitis Past Medical History Notes: hyperlipidemia Endocrine Medical History: Positive for: Type II Diabetes - 7 HgbA1c Negative for: Type I Diabetes Past Medical History Notes: hypothyroidism Time with diabetes: 6 yrs Treated with: Oral agents Blood sugar tested every day: Yes Tested : 2 times per day Integumentary (Skin) Medical History: Negative for: History of Burn Musculoskeletal Medical History: Positive for: Osteoarthritis Negative for: Gout; Rheumatoid Arthritis; Osteomyelitis Neurologic Medical History: Positive for: Neuropathy Past Medical History Notes: tremors Oncologic Medical History: Negative for: Received Chemotherapy; Received Radiation Psychiatric Medical History: Negative for: Wyn Quaker MICHALA, ORLOSKY Massey ([id number]) [id number] Page 9 of 9 Immunizations Pneumococcal Vaccine: Received Pneumococcal Vaccination: No Implantable  Devices None Hospitalization / Surgery History Type of Hospitalization/Surgery DandC/hysterescopy tonsillectomy ulnar tunnel release Family and Social History Cancer: Yes - Mother; Diabetes: No; Heart Disease: Yes - Mother; Hypertension: Yes - Mother,Maternal Grandparents; Kidney Disease: No; Lung Disease: Yes - Mother; Seizures: No; Stroke: Yes - Mother; Thyroid Problems: Yes - Maternal Grandparents; Tuberculosis: No; Current every day smoker - 1/2ppd; Marital Status - Divorced; Alcohol Use: Rarely; Drug Use: No History; Caffeine Use: Rarely; Financial Concerns: No; Food, Clothing or Shelter Needs: No; Support System Lacking: No; Transportation Concerns: No Psychologist, prison and probation services) Signed: 04/27/2023 6:15:51 PM By: Shawn Stall RN, BSN Signed: 04/28/2023 8:53:04 AM By: Baltazar Najjar MD Entered By: Shawn Stall on 04/24/2023 10:31:14 -------------------------------------------------------------------------------- SuperBill Details Patient Name: Date of Service: Autumn Massey, [name] 04/27/2023 Medical Record Number: [id number] Patient Account Number: [id number] Date of Birth/Sex: Treating RN: [birth year] (57 y.o. [name] Primary Care Provider: Oliver Barre Other Clinician: Referring Provider: Treating Provider/Extender: Carley Hammed in Treatment: 0 Diagnosis Coding ICD-10 Codes Code Description [phone]  BIRD, CRYMES Massey ([id number]) [id number] Page 1 of 9 Visit Report for 04/27/2023 Chief Complaint Document Details Patient Name: Date of Service: Autumn Massey, Autumn Massey NDA Massey. 04/27/2023 8:00 A M Medical Record Number: [id number] Patient Account Number: [id number] Date of Birth/Sex: Treating RN: 1966-06-20 (57 y.o. F) Primary Care Provider: Oliver Barre Other Clinician: Referring Provider: Treating Provider/Extender: Carley Hammed in Treatment: 0 Information Obtained from: Patient Chief Complaint 12/14/2020; patient is here for review of the wound on the right lateral lower leg which has been present for roughly 4 months 04/27/2023; patient returns to clinic with a wound on the right lateral lower leg in the similar position to last time Electronic Signature(s) Signed: 04/28/2023 8:53:04 AM By: Baltazar Najjar MD Entered By: Baltazar Najjar on 04/27/2023 05:49:49 -------------------------------------------------------------------------------- Debridement Details Patient Name: Date of Service: Autumn Massey, [name] 04/27/2023 8:00 A M Medical Record Number: [id number] Patient Account Number: [id number] Date of Birth/Sex: Treating RN: [birth year] (57 y.o. F) Primary Care Provider: Oliver Barre Other Clinician: Referring Provider: Treating Provider/Extender: Carley Hammed in Treatment: 0 Debridement Performed for Assessment: Wound #3 Right,Lateral Lower Leg Performed By: Clinician Shawn Stall, RN Debridement Type: Chemical/Enzymatic/Mechanical Agent Used: gauze and wound cleanser Severity of Tissue Pre Debridement: Fat layer exposed Level of Consciousness (Pre-procedure): Awake and Alert Pre-procedure Verification/Time Out No Taken: Percent of Wound Bed Debrided: Bleeding: None Response to Treatment: Procedure was tolerated well Level of Consciousness (Post- Awake and Alert procedure): Post Debridement Measurements of Total  Wound Length: (cm) 1.7 Width: (cm) 1.2 Depth: (cm) 0.2 Volume: (cm) 0.32 Character of Wound/Ulcer Post Debridement: Improved Severity of Tissue Post Debridement: Fat layer exposed Post Procedure Diagnosis Same as Pre-procedure Electronic Signature(s) Signed: 04/28/2023 8:53:04 AM By: Baltazar Najjar MD [place], Takia Massey ([id number]) [phone].pdf Page 2 of 9 Entered By: Baltazar Najjar on 04/27/2023 05:49:20 -------------------------------------------------------------------------------- HPI Details Patient Name: Date of Service: SEELINGER, Autumn Massey NDA Massey. 04/27/2023 8:00 A M Medical Record Number: [id number] Patient Account Number: [id number] Date of Birth/Sex: Treating RN: [birth year] (56 y.o. F) Primary Care Provider: Oliver Barre Other Clinician: Referring Provider: Treating Provider/Extender: Carley Hammed in Treatment: 0 History of Present Illness HPI Description: ADMISSION 12/14/2020 This is a pleasant 57 year old woman who is here for our review of wound on the right lateral calf. She is a type II diabetic. She said this started when she traumatized her leg hitting it on the bed frame in February. This was painful. She was left with open wounds. She initially used topical antibiotics but found this was not happening more recently she has been using a combination of diluted hydrogen peroxide, and antibiotic pads. She has support hose but does not wear them. The patient complains of a lot of pain sometimes waking her up tonight at night with a sharp jabbing discomfort. She has neuropathy but this may not be secondary to her diabetes rather idiopathic peripheral neuropathy is also listed on her problem list. Past medical history includes type 2 diabetes not on insulin last hemoglobin A1c of 8, COVID in February 2022 with chronic shortness of breath. At 1 point she was listed as having COPD although she thinks this is more asthma, hypothyroidism,  hyperlipidemia, nighttime oxygen, essential tremor hearing loss and hypertension. She had a lower extremity DVT study on 10/03/2020 and although it was a limited study because of pannus folds over her upper thighs there was no specific evidence of DVT on the right side. ABI in our clinic on the right was 0.94  6/24; patient be admitted to the clinic last week. She had 2 areas on her right lateral lower leg. Substantial amount of erythema around both of these wounds which could have been cellulitis or contact dermatitis. I favor the latter but covered her with antibiotics in any case. She arrives in clinic today things look a lot better especially her skin. Wounds are smaller. We use silver alginate 6/29; the patient is down to 1 area on the right lateral lower leg. The erythema around this is totally resolved this was likely a contact dermatitis. I been using silver alginate 7/6; wound on the right lateral lower leg is much smaller. No erythema. Her edema control is excellent 7/13; wound on the right lower leg has epithelialized but still vulnerable especially at the superior aspect. We have been using Sorbact although I do not think Sorbact is going to be necessary here. She tells that she gets her disability next Wednesday. Will not be able to order her stockings from [employer] until then. 7/21; the area on the right posterior calf is fully epithelialized. She says she is played "phone tag" with [employer] therapy she does not have new compression stockings she brings in a pair of stockings that are probably support hose and I am not really sure how old these are. READMISSION 04/27/2023 Patient is now 57 years old. She presents with a 4-week history of a wound on the right lateral lower leg in a very similar position to last time. There is no history of trauma she says she simply looked down and felt a sensation in the same area with leaking fluid. It took 3 weeks for her to get an appointment  here. She has not been wearing compression stockings reliably since the last time she was here 2 years ago. Past medical history includes type 2 diabetes, hypertension, essential tremor, cervical spine disease, hypothyroidism. I do not believe she has had venous reflux studies but she had to DVT rule outs in 2017 and 2022 that did not show any acute DVT She is on Lasix 40 mg a day I think for lower extremity edema . ABI in our clinic was 1.18 on the right Electronic Signature(s) Signed: 04/28/2023 8:53:04 AM By: Baltazar Najjar MD Entered By: Baltazar Najjar on 04/27/2023 05:51:30 -------------------------------------------------------------------------------- Physical Exam Details Patient Name: Date of Service: Autumn Massey, [name] 04/27/2023 8:00 A M Medical Record Number: [id number] Patient Account Number: [id number] Date of Birth/Sex: Treating RN: 1966/03/29 (57 y.o. F) Primary Care Provider: Oliver Barre Other Clinician: Referring Provider: Treating Provider/Extender: Carley Hammed in Treatment: 0 [place], [place] Massey ([id number]) [id number].pdf Page 3 of 9 Constitutional Sitting or standing Blood Pressure is within target range for patient.. Pulse regular and within target range for patient.[name] Respirations regular, non-labored and within target range.. Temperature is normal and within the target range for the patient.[name] Appears in no distress. Respiratory work of breathing is normal. Bilateral breath sounds are clear and equal in all lobes with no wheezes, rales or rhonchi.. Cardiovascular Heart sounds are distant but no murmurs. Pedal pulses are easily palpable in the right foot. Hemosiderin deposition in the right greater than left lower leg. She says her right leg is always larger no evidence of anything acute here. Evidence of stasis dermatitis but no palpable tenderness. Notes Wound exam; right lateral lower leg about the size of a quarter.  Perhaps 2 mm of depth healthy looking granulation. No need for debridement. She has erythema around the area but no

## 2023-05-05 ENCOUNTER — Encounter (HOSPITAL_BASED_OUTPATIENT_CLINIC_OR_DEPARTMENT_OTHER): Payer: Medicare Other | Attending: Internal Medicine | Admitting: Internal Medicine

## 2023-05-05 DIAGNOSIS — G629 Polyneuropathy, unspecified: Secondary | ICD-10-CM | POA: Insufficient documentation

## 2023-05-05 DIAGNOSIS — E119 Type 2 diabetes mellitus without complications: Secondary | ICD-10-CM | POA: Insufficient documentation

## 2023-05-05 DIAGNOSIS — I87331 Chronic venous hypertension (idiopathic) with ulcer and inflammation of right lower extremity: Secondary | ICD-10-CM | POA: Diagnosis not present

## 2023-05-05 DIAGNOSIS — Z8616 Personal history of COVID-19: Secondary | ICD-10-CM | POA: Insufficient documentation

## 2023-05-05 DIAGNOSIS — L97812 Non-pressure chronic ulcer of other part of right lower leg with fat layer exposed: Secondary | ICD-10-CM | POA: Diagnosis not present

## 2023-05-05 DIAGNOSIS — L97918 Non-pressure chronic ulcer of unspecified part of right lower leg with other specified severity: Secondary | ICD-10-CM | POA: Diagnosis not present

## 2023-05-05 DIAGNOSIS — H9190 Unspecified hearing loss, unspecified ear: Secondary | ICD-10-CM | POA: Insufficient documentation

## 2023-05-05 DIAGNOSIS — E11622 Type 2 diabetes mellitus with other skin ulcer: Secondary | ICD-10-CM | POA: Diagnosis not present

## 2023-05-06 NOTE — Progress Notes (Signed)
SHIAH, BERHOW D ([id number]) [id number] Page 1 of 8 Visit Report for 05/05/2023 Arrival Information Details Patient Name: Date of Service: STIRN, [place] NDA D. 05/05/2023 8:30 A M Medical Record Number: [id number] Patient Account Number: [id number] Date of Birth/Sex: Treating RN: [birth year] (57 y.o. Dola Argyle, Lyla Son Primary Care Excell Neyland: Oliver Barre Other Clinician: Referring Jaquon Gingerich: Treating Damacio Weisgerber/Extender: Carley Hammed in Treatment: 1 Visit Information History Since Last Visit Added or deleted any medications: No Patient Arrived: Ambulatory Any new allergies or adverse reactions: No Arrival Time: 08:44 Had a fall or experienced change in No Accompanied By: self activities of daily living that may affect Transfer Assistance: None risk of falls: Patient Identification Verified: Yes Signs or symptoms of abuse/neglect since last visito No Secondary Verification Process Completed: Yes Hospitalized since last visit: No Patient Requires Transmission-Based Precautions: No Implantable device outside of the clinic excluding No Patient Has Alerts: No cellular tissue based products placed in the center since last visit: Has Dressing in Place as Prescribed: Yes Has Compression in Place as Prescribed: Yes Pain Present Now: No Electronic Signature(s) Signed: 05/05/2023 4:19:11 PM By: Redmond Pulling RN, BSN Entered By: Redmond Pulling on 05/05/2023 05:47:16 -------------------------------------------------------------------------------- Compression Therapy Details Patient Name: Date of Service: [name], [name] 05/05/2023 8:30 A M Medical Record Number: [id number] Patient Account Number: [id number] Date of Birth/Sex: Treating RN: 09-22-1965 (58 y.o. [name] Primary Care Verginia Toohey: Oliver Barre Other Clinician: Referring Gearldine Looney: Treating Thoma Paulsen/Extender: Carley Hammed in Treatment: 1 Compression Therapy  Performed for Wound Assessment: Wound #3 Right,Lateral Lower Leg Performed By: Clinician Redmond Pulling, RN Compression Type: Four Layer Post Procedure Diagnosis Same as Pre-procedure Electronic Signature(s) Signed: 05/05/2023 4:19:11 PM By: Redmond Pulling RN, BSN Entered By: Redmond Pulling on 05/05/2023 06:00:40 Arlana Pouch D ([id number]) [id number] Page 2 of 8 -------------------------------------------------------------------------------- Encounter Discharge Information Details Patient Name: Date of Service: KRUCK, [place] NDA D. 05/05/2023 8:30 A M Medical Record Number: [id number] Patient Account Number: [id number] Date of Birth/Sex: Treating RN: 02-04-1966 (57 y.o. [name] Primary Care Jakala Herford: Oliver Barre Other Clinician: Referring Zylee Marchiano: Treating Emersyn Wyss/Extender: Carley Hammed in Treatment: 1 Encounter Discharge Information Items Discharge Condition: Stable Ambulatory Status: Ambulatory Discharge Destination: Home Transportation: Private Auto Accompanied By: self Schedule Follow-up Appointment: Yes Clinical Summary of Care: Patient Declined Electronic Signature(s) Signed: 05/05/2023 4:19:11 PM By: Redmond Pulling RN, BSN Entered By: Redmond Pulling on 05/05/2023 06:07:57 -------------------------------------------------------------------------------- Lower Extremity Assessment Details Patient Name: Date of Service: [name], [name] 05/05/2023 8:30 A M Medical Record Number: [id number] Patient Account Number: [id number] Date of Birth/Sex: Treating RN: [birth year] (57 y.o. [name] Primary Care Bobbijo Holst: Oliver Barre Other Clinician: Referring Plez Belton: Treating Isiaih Hollenbach/Extender: Carley Hammed in Treatment: 1 Edema Assessment Assessed: Kyra Searles: No] Franne Forts: No] Edema: [Left: Ye] [Right: s] Calf Left: Right: Point of Measurement: 32 cm From Medial Instep 40 cm 39 cm Ankle Left:  Right: Point of Measurement: 13 cm From Medial Instep 23 cm 22.3 cm Knee To Floor Left: Right: From Medial Instep 38 cm 38 cm Vascular Assessment Pulses: Dorsalis Pedis Palpable: [Right:Yes] Extremity colors, hair growth, and conditions: Extremity Color: [Right:Hyperpigmented] Hair Growth on Extremity: [Right:No] Temperature of Extremity: [Right:Warm] Capillary Refill: [Right:< 3 seconds] Dependent Rubor: [Right:No Yes] Electronic Signature(s) Signed: 05/06/2023 3:58:55 PM By: Redmond Pulling RN, BSN Previous Signature: 05/05/2023 4:19:11 PM Version By: Redmond Pulling RN, BSN [place], [place] D ([id number]) [phone].pdf Page 3 of 8 Previous Signature: 05/05/2023 4:19:11 PM Version By: Rubye Oaks,  Lyla Son RN, BSN Entered By: Redmond Pulling on 05/05/2023 13:31:27 -------------------------------------------------------------------------------- Multi Wound Chart Details Patient Name: Date of Service: W[place], [place] NDA D. 05/05/2023 8:30 A M Medical Record Number: [id number] Patient Account Number: [id number] Date of Birth/Sex: Treating RN: 07-03-1965 (57 y.o. F) Primary Care Quintarius Ferns: Oliver Barre Other Clinician: Referring Simrat Kendrick: Treating Rafaelita Foister/Extender: Carley Hammed in Treatment: 1 Vital Signs Height(in): 61 Pulse(bpm): 77 Weight(lbs): 278 Blood Pressure(mmHg): 115/70 Body Mass Index(BMI): 52.5 Temperature(F): 98.4 Respiratory Rate(breaths/min): 18 [3:Photos:] [N/A:N/A] Right, Lateral Lower Leg N/A N/A Wound Location: Gradually Appeared N/A N/A Wounding Event: Diabetic Wound/Ulcer of the Lower N/A N/A Primary Etiology: Extremity Venous Leg Ulcer N/A N/A Secondary Etiology: Asthma, Chronic Obstructive N/A N/A Comorbid History: Pulmonary Disease (COPD), Congestive Heart Failure, Hypertension, Peripheral Venous Disease, Type II Diabetes, Osteoarthritis, Neuropathy 04/06/2023 N/A N/A Date Acquired: 1 N/A N/A Weeks of  Treatment: Open N/A N/A Wound Status: No N/A N/A Wound Recurrence: 1.3x0.6x0.2 N/A N/A Measurements L x W x D (cm) 0.613 N/A N/A A (cm) : rea 0.123 N/A N/A Volume (cm) : 61.70% N/A N/A % Reduction in A rea: 61.60% N/A N/A % Reduction in Volume: Grade 1 N/A N/A Classification: Medium N/A N/A Exudate A mount: Serosanguineous N/A N/A Exudate Type: red, brown N/A N/A Exudate Color: Distinct, outline attached N/A N/A Wound Margin: Large (67-100%) N/A N/A Granulation A mount: Red, Pink N/A N/A Granulation Quality: Small (1-33%) N/A N/A Necrotic A mount: Fat Layer (Subcutaneous Tissue): Yes N/A N/A Exposed Structures: Fascia: No Tendon: No Muscle: No Joint: No Bone: No Medium (34-66%) N/A N/A Epithelialization: Excoriation: No N/A N/A Periwound Skin Texture: Induration: No Callus: No Crepitus: No Rash: No Scarring: No Maceration: No N/A N/A Periwound Skin Moisture: Dry/Scaly: No MYLENA, SEDBERRY D ([id number]) [id number] Page 4 of 8 Hemosiderin Staining: Yes N/A N/A Periwound Skin Color: Atrophie Blanche: No Cyanosis: No Ecchymosis: No Erythema: No Mottled: No Pallor: No Rubor: No No Abnormality N/A N/A Temperature: Compression Therapy N/A N/A Procedures Performed: Treatment Notes Wound #3 (Lower Leg) Wound Laterality: Right, Lateral Cleanser Vashe 5.8 (oz) Discharge Instruction: Cleanse the wound with Vashe prior to applying a clean dressing using gauze sponges, not tissue or cotton balls. Wound Cleanser Discharge Instruction: Cleanse the wound with wound cleanser prior to applying a clean dressing using gauze sponges, not tissue or cotton balls. Peri-Wound Care Triamcinolone 15 (g) Discharge Instruction: Use triamcinolone 15 (g) as directed Sween Lotion (Moisturizing lotion) Discharge Instruction: Apply moisturizing lotion as directed Topical Primary Dressing Hydrofera Blue Ready Transfer Foam, 2.5x2.5  (in/in) Discharge Instruction: Apply directly to wound bed as directed Secondary Dressing ABD Pad, 5x9 Discharge Instruction: Apply over primary dressing as directed. Secured With Compression Wrap Urgo K2, (equivalent to a 4 layer) two layer compression system, regular Discharge Instruction: Apply Urgo K2 as directed (alternative to 4 layer compression). Compression Stockings Add-Ons Electronic Signature(s) Signed: 05/05/2023 4:32:14 PM By: Baltazar Najjar MD Entered By: Baltazar Najjar on 05/05/2023 06:11:32 -------------------------------------------------------------------------------- Multi-Disciplinary Care Plan Details Patient Name: Date of Service: [name], [name] 05/05/2023 8:30 A M Medical Record Number: [id number] Patient Account Number: [id number] Date of Birth/Sex: Treating RN: 02/26/1966 (58 y.o. [name] Primary Care Maveryck Bahri: Oliver Barre Other Clinician: Referring Johnnae Impastato: Treating Dazha Kempa/Extender: Carley Hammed in Treatment: 1 Active Inactive Pain, Acute or Chronic Nursing Diagnoses: AUNDRA, PUNG ([id number]) [phone].pdf Page 5 of 8 Pain, acute or chronic: actual or potential Potential alteration in comfort, pain Goals: Patient will verbalize adequate pain control and receive pain  control interventions during procedures as needed Date Initiated: 04/27/2023 Target Resolution Date: 05/08/2023 Goal Status: Active Patient/caregiver will verbalize comfort level met Date Initiated: 04/27/2023 Target Resolution Date: 05/08/2023 Goal Status: Active Interventions: Encourage patient to take pain medications as prescribed Provide education on pain management Treatment Activities: Administer pain control measures as ordered : 04/27/2023 Notes: Wound/Skin Impairment Nursing Diagnoses: Knowledge deficit related to ulceration/compromised skin integrity Goals: Patient/caregiver will verbalize understanding  of skin care regimen Date Initiated: 04/27/2023 Target Resolution Date: 05/08/2023 Goal Status: Active Interventions: Assess patient/caregiver ability to perform ulcer/skin care regimen upon admission and as needed Assess ulceration(s) every visit Provide education on ulcer and skin care Treatment Activities: Skin care regimen initiated : 04/27/2023 Topical wound management initiated : 04/27/2023 Notes: Electronic Signature(s) Signed: 05/05/2023 4:19:11 PM By: Redmond Pulling RN, BSN Entered By: Redmond Pulling on 05/05/2023 05:56:27 -------------------------------------------------------------------------------- Pain Assessment Details Patient Name: Date of Service: [name], [name] 05/05/2023 8:30 A M Medical Record Number: [id number] Patient Account Number: [id number] Date of Birth/Sex: Treating RN: 25-Apr-1966 (57 y.o. [name] Primary Care Cloys Vera: Oliver Barre Other Clinician: Referring Natelie Ostrosky: Treating Jahzion Brogden/Extender: Carley Hammed in Treatment: 1 Active Problems Location of Pain Severity and Description of Pain Patient Has Paino No Site Locations [place], [place] D ([id number]) [id number] Page 6 of 8 Pain Management and Medication Current Pain Management: Electronic Signature(s) Signed: 05/05/2023 4:19:11 PM By: Redmond Pulling RN, BSN Entered By: Redmond Pulling on 05/05/2023 05:47:58 -------------------------------------------------------------------------------- Patient/Caregiver Education Details Patient Name: Date of Service: Tye [place] NDA D. 11/5/2024andnbsp8:30 A M Medical Record Number: [id number] Patient Account Number: [id number] Date of Birth/Gender: Treating RN: 10/11/1965 (57 y.o. [name] Primary Care Physician: Oliver Barre Other Clinician: Referring Physician: Treating Physician/Extender: Carley Hammed in Treatment: 1 Education Assessment Education Provided  To: Patient Education Topics Provided Wound/Skin Impairment: Methods: Explain/Verbal Responses: State content correctly Electronic Signature(s) Signed: 05/05/2023 4:19:11 PM By: Redmond Pulling RN, BSN Entered By: Redmond Pulling on 05/05/2023 05:56:41 -------------------------------------------------------------------------------- Wound Assessment Details Patient Name: Date of Service: [name], [name] 05/05/2023 8:30 A M Medical Record Number: [id number] Patient Account Number: [id number] Date of Birth/Sex: Treating RN: 26-Oct-1965 (57 y.o. [name] Primary Care Mc Bloodworth: Oliver Barre Other Clinician: Referring Ian Cavey: Treating Wake Conlee/Extender: Jennefer, Kopp, [place]le[place] D ([id number]) [phone].pdf Page 7 of 8 Weeks in Treatment: 1 Wound Status Wound Number: 3 Primary Diabetic Wound/Ulcer of the Lower Extremity Etiology: Wound Location: Right, Lateral Lower Leg Secondary Venous Leg Ulcer Wounding Event: Gradually Appeared Etiology: Date Acquired: 04/06/2023 Wound Open Weeks Of Treatment: 1 Status: Clustered Wound: No Comorbid Asthma, Chronic Obstructive Pulmonary Disease (COPD), History: Congestive Heart Failure, Hypertension, Peripheral Venous Disease, Type II Diabetes, Osteoarthritis, Neuropathy Photos Wound Measurements Length: (cm) 1.3 Width: (cm) 0.6 Depth: (cm) 0.2 Area: (cm) 0.613 Volume: (cm) 0.123 % Reduction in Area: 61.7% % Reduction in Volume: 61.6% Epithelialization: Medium (34-66%) Tunneling: No Undermining: No Wound Description Classification: Grade 1 Wound Margin: Distinct, outline attached Exudate Amount: Medium Exudate Type: Serosanguineous Exudate Color: red, brown Foul Odor After Cleansing: No Slough/Fibrino Yes Wound Bed Granulation Amount: Large (67-100%) Exposed Structure Granulation Quality: Red, Pink Fascia Exposed: No Necrotic Amount: Small (1-33%) Fat Layer (Subcutaneous Tissue)  Exposed: Yes Necrotic Quality: Adherent Slough Tendon Exposed: No Muscle Exposed: No Joint Exposed: No Bone Exposed: No Periwound Skin Texture Texture Color No Abnormalities Noted: No No Abnormalities Noted: No Callus: No Atrophie Blanche: No Crepitus: No Cyanosis: No Excoriation: No Ecchymosis: No Induration: No Erythema: No  Rash: No Hemosiderin Staining: Yes Scarring: No Mottled: No Pallor: No Moisture Rubor: No No Abnormalities Noted: No Dry / Scaly: No Temperature / Pain Maceration: No Temperature: No Abnormality Treatment Notes Wound #3 (Lower Leg) Wound Laterality: Right, Lateral Cleanser Vashe 5.8 (oz) Discharge Instruction: Cleanse the wound with Vashe prior to applying a clean dressing using gauze sponges, not tissue or cotton balls. Wound Cleanser Discharge Instruction: Cleanse the wound with wound cleanser prior to applying a clean dressing using gauze sponges, not tissue or cotton balls. STORMI, VANDEVELDE D ([id number]) [id number] Page 8 of 8 Peri-Wound Care Triamcinolone 15 (g) Discharge Instruction: Use triamcinolone 15 (g) as directed Sween Lotion (Moisturizing lotion) Discharge Instruction: Apply moisturizing lotion as directed Topical Primary Dressing Hydrofera Blue Ready Transfer Foam, 2.5x2.5 (in/in) Discharge Instruction: Apply directly to wound bed as directed Secondary Dressing ABD Pad, 5x9 Discharge Instruction: Apply over primary dressing as directed. Secured With Compression Wrap Urgo K2, (equivalent to a 4 layer) two layer compression system, regular Discharge Instruction: Apply Urgo K2 as directed (alternative to 4 layer compression). Compression Stockings Add-Ons Electronic Signature(s) Signed: 05/05/2023 12:20:53 PM By: Shawn Stall RN, BSN Signed: 05/05/2023 4:19:11 PM By: Redmond Pulling RN, BSN Entered By: Shawn Stall on 05/05/2023  05:54:31 -------------------------------------------------------------------------------- Vitals Details Patient Name: Date of Service: [name], [name] 05/05/2023 8:30 A M Medical Record Number: [id number] Patient Account Number: [id number] Date of Birth/Sex: Treating RN: Feb 15, 1966 (57 y.o. [name] Primary Care Kelvin Sennett: Oliver Barre Other Clinician: Referring Gabrianna Fassnacht: Treating Bayyinah Dukeman/Extender: Carley Hammed in Treatment: 1 Vital Signs Time Taken: 08:47 Temperature (F): 98.4 Height (in): 61 Pulse (bpm): 77 Weight (lbs): 278 Respiratory Rate (breaths/min): 18 Body Mass Index (BMI): 52.5 Blood Pressure (mmHg): 115/70 Reference Range: 80 - 120 mg / dl Notes pt stated did not check BS this moring Electronic Signature(s) Signed: 05/05/2023 4:19:11 PM By: Redmond Pulling RN, BSN Entered By: Redmond Pulling on 05/05/2023 05:47:53

## 2023-05-06 NOTE — Progress Notes (Addendum)
Autumn Massey, Autumn Massey ([id number]) [id number] Page 1 of 6 Visit Report for 05/05/2023 HPI Details Patient Name: Date of Service: Autumn Massey, Autumn Massey. 05/05/2023 8:30 A M Medical Record Number: [id number] Patient Account Number: [id number] Date of Birth/Sex: Treating RN: [birth year] (57 y.o. F) Primary Care Provider: Oliver Massey Other Clinician: Referring Provider: Treating Provider/Extender: Autumn Massey in Treatment: 1 History of Present Illness HPI Description: ADMISSION 12/14/2020 This is a pleasant 57 year old woman who is here for our review of wound on the right lateral calf. She is a type II diabetic. She said this started when she traumatized her leg hitting it on the bed frame in February. This was painful. She was left with open wounds. She initially used topical antibiotics but found this was not happening more recently she has been using a combination of diluted hydrogen peroxide, and antibiotic pads. She has support hose but does not wear them. The patient complains of a lot of pain sometimes waking her up tonight at night with a sharp jabbing discomfort. She has neuropathy but this may not be secondary to her diabetes rather idiopathic peripheral neuropathy is also listed on her problem list. Past medical history includes type 2 diabetes not on insulin last hemoglobin A1c of 8, COVID in February 2022 with chronic shortness of breath. At 1 point she was listed as having COPD although she thinks this is more asthma, hypothyroidism, hyperlipidemia, nighttime oxygen, essential tremor hearing loss and hypertension. She had a lower extremity DVT study on 10/03/2020 and although it was a limited study because of pannus folds over her upper thighs there was no specific evidence of DVT on the right side. ABI in our clinic on the right was 0.94 6/24; patient be admitted to the clinic last week. She had 2 areas on her right lateral lower leg.  Substantial amount of erythema around both of these wounds which could have been cellulitis or contact dermatitis. I favor the latter but covered her with antibiotics in any case. She arrives in clinic today things look a lot better especially her skin. Wounds are smaller. We use silver alginate 6/29; the patient is down to 1 area on the right lateral lower leg. The erythema around this is totally resolved this was likely a contact dermatitis. I been using silver alginate 7/6; wound on the right lateral lower leg is much smaller. No erythema. Her edema control is excellent 7/13; wound on the right lower leg has epithelialized but still vulnerable especially at the superior aspect. We have been using Sorbact although I do not think Sorbact is going to be necessary here. She tells that she gets her disability next Wednesday. Will not be able to order her stockings from [facility] until then. 7/21; the area on the right posterior calf is fully epithelialized. She says she is played "phone tag" with elastic therapy she does not have new compression stockings she brings in a pair of stockings that are probably support hose and I am not really sure how old these are. READMISSION 04/27/2023 Patient is now 57 years old. She presents with a 4-week history of a wound on the right lateral lower leg in a very similar position to last time. There is no history of trauma she says she simply looked down and felt a sensation in the same area with leaking fluid. It took 3 weeks for her to get an appointment here. She has not been wearing compression stockings reliably since the last time she was here  2 years ago. Past medical history includes type 2 diabetes, hypertension, essential tremor, cervical spine disease, hypothyroidism. I do not believe she has had venous reflux studies but she had to DVT rule outs in 2017 and 2022 that did not show any acute DVT She is on Lasix 40 mg a day I think for lower extremity  edema . ABI in our clinic was 1.18 on the right 11/5; second visit on this stay in our clinic. She has a wound on the right lateral lower leg. This is smaller with healthy granulation. We have been using Hydrofera Blue under 4-layer equivalent compression. She tells me she is decided on juxta lite/external compression stockings Electronic Signature(s) Signed: 05/05/2023 4:32:14 PM By: Autumn Najjar MD Entered By: Autumn Massey on 05/05/2023 09:12:34 -------------------------------------------------------------------------------- Physical Exam Details Patient Name: Date of Service: Autumn Massey, Autumn NDA Massey. 05/05/2023 8:30 A M Medical Record Number: [id number] Patient Account Number: [id number] Date of Birth/Sex: Treating RN: 11/11/1965 (57 y.o. Autumn Massey, Autumn Massey ([id number]) [id number] Page 2 of 6 Primary Care Provider: Oliver Massey Other Clinician: Referring Provider: Treating Provider/Extender: Autumn Massey in Treatment: 1 Constitutional Sitting or standing Blood Pressure is within target range for patient.. Pulse regular and within target range for patient.[name] Respirations regular, non-labored and within target range.. Temperature is normal and within the target range for the patient.[name] Appears in no distress. Cardiovascular Very significant hemosiderin deposition probably some degree of lipodermatosclerosis. Notes Wound exam; right lateral lower leg this is smaller healthy looking granulation under illumination granulation looks satisfactory no need for mechanical debridement no evidence of surrounding infection. Her edema control is good. Electronic Signature(s) Signed: 05/05/2023 4:32:14 PM By: Autumn Najjar MD Entered By: Autumn Massey on 05/05/2023 09:14:00 -------------------------------------------------------------------------------- Physician Orders Details Patient Name: Date of Service: Autumn Massey, Autumn NDA Massey. 05/05/2023 8:30 A  M Medical Record Number: [id number] Patient Account Number: [id number] Date of Birth/Sex: Treating RN: 23-Sep-1965 (57 y.o. Autumn Massey Primary Care Provider: Oliver Massey Other Clinician: Referring Provider: Treating Provider/Extender: Autumn Massey in Treatment: 1 Verbal / Phone Orders: No Diagnosis Coding Follow-up Appointments ppointment in 1 week. - Dr. Leanord Hawking 05/11/23 @ 0815 Return A ppointment in 2 weeks. - Dr. Leanord Hawking 05/18/23 @ 0900 Return A Return appointment in 3 weeks. - Front office to schedule Return appointment in 1 month. - Front office to schedule Other: - consider what type of compression stockings you want. Anesthetic (In clinic) Topical Lidocaine 4% applied to wound bed Bathing/ Shower/ Hygiene May shower with protection but do not get wound dressing(s) wet. Protect dressing(s) with water repellant cover (for example, large plastic bag) or a cast cover and may then take shower. Edema Control - Orders / Instructions Elevate legs to the level of the heart or above for 30 minutes daily and/or when sitting for 3-4 times a day throughout the day. Avoid standing for long periods of time. Exercise regularly Wound Treatment Wound #3 - Lower Leg Wound Laterality: Right, Lateral Cleanser: Vashe 5.8 (oz) 1 x Per Week/30 Days Discharge Instructions: Cleanse the wound with Vashe prior to applying a clean dressing using gauze sponges, not tissue or cotton balls. Cleanser: Wound Cleanser 1 x Per Week/30 Days Discharge Instructions: Cleanse the wound with wound cleanser prior to applying a clean dressing using gauze sponges, not tissue or cotton balls. Peri-Wound Care: Triamcinolone 15 (g) 1 x Per Week/30 Days Discharge Instructions: Use triamcinolone 15 (g) as directed Peri-Wound Care: Sween Lotion (Moisturizing lotion) 1 x Per Week/30 Days  Discharge Instructions: Apply moisturizing lotion as directed Prim Dressing: Hydrofera Blue Ready Transfer Foam,  2.5x2.5 (in/in) 1 x Per Week/30 Days ary Discharge Instructions: Apply directly to wound bed as directed Secondary Dressing: ABD Pad, 5x9 1 x Per Week/30 Days MIN, COPELAND Massey ([id number]) [id number] Page 3 of 6 Discharge Instructions: Apply over primary dressing as directed. Compression Wrap: Urgo K2, (equivalent to a 4 layer) two layer compression system, regular 1 x Per Week/30 Days Discharge Instructions: Apply Urgo K2 as directed (alternative to 4 layer compression). Compression Stockings: Circaid Juxta Lite Compression Wrap Left Leg Compression Amount: 30-40 mmHG Right Leg Compression Amount: 30-40 mmHG Discharge Instructions: Apply Circaid Juxta Lite Compression Wrap daily as instructed. Apply first thing in the morning, remove at night before bed. Patient Medications llergies: Cymbalta A Notifications Medication Indication Start End 05/05/2023 lidocaine DOSE topical 4 % cream - cream topical once daily Electronic Signature(s) Signed: 05/06/2023 3:58:55 PM By: Redmond Pulling RN, BSN Signed: 05/06/2023 4:54:50 PM By: Autumn Najjar MD Previous Signature: 05/05/2023 4:19:11 PM Version By: Redmond Pulling RN, BSN Previous Signature: 05/05/2023 4:32:14 PM Version By: Autumn Najjar MD Entered By: Redmond Pulling on 05/05/2023 16:33:40 -------------------------------------------------------------------------------- Problem List Details Patient Name: Date of Service: Autumn Massey, Autumn NDA Massey. 05/05/2023 8:30 A M Medical Record Number: [id number] Patient Account Number: [id number] Date of Birth/Sex: Treating RN: [birth year] (57 y.o. F) Primary Care Provider: Oliver Massey Other Clinician: Referring Provider: Treating Provider/Extender: Autumn Massey in Treatment: 1 Active Problems ICD-10 Encounter Code Description Active Date MDM Diagnosis I87.331 Chronic venous hypertension (idiopathic) with ulcer and inflammation of right 04/27/2023 No  Yes lower extremity L97.918 Non-pressure chronic ulcer of unspecified part of right lower leg with other 04/27/2023 No Yes specified severity Inactive Problems Resolved Problems Electronic Signature(s) Signed: 05/05/2023 4:32:14 PM By: Autumn Najjar MD Entered By: Autumn Massey on 05/05/2023 09:11:19 Autumn Massey, Autumn Massey ([id number]) [id number] Page 4 of 6 -------------------------------------------------------------------------------- Progress Note Details Patient Name: Date of Service: Autumn Massey, Autumn Massey. 05/05/2023 8:30 A M Medical Record Number: [id number] Patient Account Number: [id number] Date of Birth/Sex: Treating RN: 1966-05-26 (57 y.o. F) Primary Care Provider: Oliver Massey Other Clinician: Referring Provider: Treating Provider/Extender: Autumn Massey in Treatment: 1 Subjective History of Present Illness (HPI) ADMISSION 12/14/2020 This is a pleasant 57 year old woman who is here for our review of wound on the right lateral calf. She is a type II diabetic. She said this started when she traumatized her leg hitting it on the bed frame in February. This was painful. She was left with open wounds. She initially used topical antibiotics but found this was not happening more recently she has been using a combination of diluted hydrogen peroxide, and antibiotic pads. She has support hose but does not wear them. The patient complains of a lot of pain sometimes waking her up tonight at night with a sharp jabbing discomfort. She has neuropathy but this may not be secondary to her diabetes rather idiopathic peripheral neuropathy is also listed on her problem list. Past medical history includes type 2 diabetes not on insulin last hemoglobin A1c of 8, COVID in February 2022 with chronic shortness of breath. At 1 point she was listed as having COPD although she thinks this is more asthma, hypothyroidism, hyperlipidemia, nighttime oxygen,  essential tremor hearing loss and hypertension. She had a lower extremity DVT study on 10/03/2020 and although it was a limited study because of pannus folds over her upper thighs there was no specific evidence of DVT  on the right side. ABI in our clinic on the right was 0.94 6/24; patient be admitted to the clinic last week. She had 2 areas on her right lateral lower leg. Substantial amount of erythema around both of these wounds which could have been cellulitis or contact dermatitis. I favor the latter but covered her with antibiotics in any case. She arrives in clinic today things look a lot better especially her skin. Wounds are smaller. We use silver alginate 6/29; the patient is down to 1 area on the right lateral lower leg. The erythema around this is totally resolved this was likely a contact dermatitis. I been using silver alginate 7/6; wound on the right lateral lower leg is much smaller. No erythema. Her edema control is excellent 7/13; wound on the right lower leg has epithelialized but still vulnerable especially at the superior aspect. We have been using Sorbact although I do not think Sorbact is going to be necessary here. She tells that she gets her disability next Wednesday. Will not be able to order her stockings from [facility] until then. 7/21; the area on the right posterior calf is fully epithelialized. She says she is played "phone tag" with elastic therapy she does not have new compression stockings she brings in a pair of stockings that are probably support hose and I am not really sure how old these are. READMISSION 04/27/2023 Patient is now 57 years old. She presents with a 4-week history of a wound on the right lateral lower leg in a very similar position to last time. There is no history of trauma she says she simply looked down and felt a sensation in the same area with leaking fluid. It took 3 weeks for her to get an appointment here. She has not been wearing  compression stockings reliably since the last time she was here 2 years ago. Past medical history includes type 2 diabetes, hypertension, essential tremor, cervical spine disease, hypothyroidism. I do not believe she has had venous reflux studies but she had to DVT rule outs in 2017 and 2022 that did not show any acute DVT She is on Lasix 40 mg a day I think for lower extremity edema . ABI in our clinic was 1.18 on the right 11/5; second visit on this stay in our clinic. She has a wound on the right lateral lower leg. This is smaller with healthy granulation. We have been using Hydrofera Blue under 4-layer equivalent compression. She tells me she is decided on juxta lite/external compression stockings Objective Constitutional Sitting or standing Blood Pressure is within target range for patient.. Pulse regular and within target range for patient.[name] Respirations regular, non-labored and within target range.. Temperature is normal and within the target range for the patient.[name] Appears in no distress. Vitals Time Taken: 8:47 AM, Height: 61 in, Weight: 278 lbs, BMI: 52.5, Temperature: 98.4 F, Pulse: 77 bpm, Respiratory Rate: 18 breaths/min, Blood Pressure: 115/70 mmHg. General Notes: pt stated did not check BS this moring Cardiovascular Very significant hemosiderin deposition probably some degree of lipodermatosclerosis. General Notes: Wound exam; right lateral lower leg this is smaller healthy looking granulation under illumination granulation looks satisfactory no need for mechanical debridement no evidence of surrounding infection. Her edema control is good. Integumentary (Hair, Skin) Autumn Massey, Autumn Massey ([id number]) [id number] Page 5 of 6 Wound #3 status is Open. Original cause of wound was Gradually Appeared. The date acquired was: 04/06/2023. The wound has been in treatment 1 weeks. The wound is located on the  Right,Lateral Lower Leg. The wound measures 1.3cm length x  0.6cm width x 0.2cm depth; 0.613cm^2 area and 0.123cm^3 volume. There is Fat Layer (Subcutaneous Tissue) exposed. There is no tunneling or undermining noted. There is a medium amount of serosanguineous drainage noted. The wound margin is distinct with the outline attached to the wound base. There is large (67-100%) red, pink granulation within the wound bed. There is a small (1-33%) amount of necrotic tissue within the wound bed including Adherent Slough. The periwound skin appearance exhibited: Hemosiderin Staining. The periwound skin appearance did not exhibit: Callus, Crepitus, Excoriation, Induration, Rash, Scarring, Dry/Scaly, Maceration, Atrophie Blanche, Cyanosis, Ecchymosis, Mottled, Pallor, Rubor, Erythema. Periwound temperature was noted as No Abnormality. Assessment Active Problems ICD-10 Chronic venous hypertension (idiopathic) with ulcer and inflammation of right lower extremity Non-pressure chronic ulcer of unspecified part of right lower leg with other specified severity Procedures Wound #3 Pre-procedure diagnosis of Wound #3 is a Diabetic Wound/Ulcer of the Lower Extremity located on the Right,Lateral Lower Leg . There was a Four Layer Compression Therapy Procedure by Redmond Pulling, RN. Post procedure Diagnosis Wound #3: Same as Pre-Procedure Plan Follow-up Appointments: Return Appointment in 1 week. - Dr. Leanord Hawking 05/11/23 @ 0815 Return Appointment in 2 weeks. - Dr. Leanord Hawking 05/18/23 @ 0900 Return appointment in 3 weeks. - Front office to schedule Return appointment in 1 month. - Front office to schedule Other: - consider what type of compression stockings you want. Anesthetic: (In clinic) Topical Lidocaine 4% applied to wound bed Bathing/ Shower/ Hygiene: May shower with protection but do not get wound dressing(s) wet. Protect dressing(s) with water repellant cover (for example, large plastic bag) or a cast cover and may then take shower. Edema Control - Orders /  Instructions: Elevate legs to the level of the heart or above for 30 minutes daily and/or when sitting for 3-4 times a day throughout the day. Avoid standing for long periods of time. Exercise regularly The following medication(s) was prescribed: lidocaine topical 4 % cream cream topical once daily was prescribed at facility WOUND #3: - Lower Leg Wound Laterality: Right, Lateral Cleanser: Vashe 5.8 (oz) 1 x Per Week/30 Days Discharge Instructions: Cleanse the wound with Vashe prior to applying a clean dressing using gauze sponges, not tissue or cotton balls. Cleanser: Wound Cleanser 1 x Per Week/30 Days Discharge Instructions: Cleanse the wound with wound cleanser prior to applying a clean dressing using gauze sponges, not tissue or cotton balls. Peri-Wound Care: Triamcinolone 15 (g) 1 x Per Week/30 Days Discharge Instructions: Use triamcinolone 15 (g) as directed Peri-Wound Care: Sween Lotion (Moisturizing lotion) 1 x Per Week/30 Days Discharge Instructions: Apply moisturizing lotion as directed Prim Dressing: Hydrofera Blue Ready Transfer Foam, 2.5x2.5 (in/in) 1 x Per Week/30 Days ary Discharge Instructions: Apply directly to wound bed as directed Secondary Dressing: ABD Pad, 5x9 1 x Per Week/30 Days Discharge Instructions: Apply over primary dressing as directed. Com pression Wrap: Urgo K2, (equivalent to a 4 layer) two layer compression system, regular 1 x Per Week/30 Days Discharge Instructions: Apply Urgo K2 as directed (alternative to 4 layer compression). Com pression Stockings: Circaid Juxta Lite Compression Wrap Compression Amount: 30-40 mmHg (left) Compression Amount: 30-40 mmHg (right) Discharge Instructions: Apply Circaid Juxta Lite Compression Wrap daily as instructed. Apply first thing in the morning, remove at night before bed. 1. Everything looks really quite good here. 2. No need to change the primary dressing which is Hydrofera Blue ABDs under 4-layer equivalent  compression 3. We talked about stockings  I think we are about 2 to 3 weeks away from requiring these and I told her this. She is talking about external compression stockings/juxta lites Electronic Signature(s) Signed: 05/07/2023 4:34:18 PM By: Autumn Najjar MD Autumn Massey, Autumn Massey ([id number]) [phone].pdf Page 6 of 6 Signed: 05/07/2023 6:10:25 PM By: Shawn Stall RN, BSN Previous Signature: 05/05/2023 4:32:14 PM Version By: Autumn Najjar MD Entered By: Shawn Stall on 05/07/2023 16:01:59 -------------------------------------------------------------------------------- SuperBill Details Patient Name: Date of Service: Autumn Massey, Autumn NDA Massey. 05/05/2023 Medical Record Number: [id number] Patient Account Number: [id number] Date of Birth/Sex: Treating RN: Mar 21, 1966 (57 y.o. Autumn Massey Primary Care Provider: Oliver Massey Other Clinician: Referring Provider: Treating Provider/Extender: Autumn Massey in Treatment: 1 Diagnosis Coding ICD-10 Codes Code Description [phone] Chronic venous hypertension (idiopathic) with ulcer and inflammation of right lower extremity L97.918 Non-pressure chronic ulcer of unspecified part of right lower leg with other specified severity Facility Procedures : CPT4 Code: [id number] Description: (Facility Use Only) [phone] - APPLY MULTLAY COMPRS LWR RT LEG Modifier: Quantity: 1 Physician Procedures : CPT4 Code Description Modifier [id number] - WC PHYS LEVEL 3 - EST PT ICD-10 Diagnosis Description I87.331 Chronic venous hypertension (idiopathic) with ulcer and inflammation of right lower extremity L97.918 Non-pressure chronic ulcer of unspecified  part of right lower leg with other specified severity Quantity: 1 Electronic Signature(s) Signed: 05/05/2023 4:32:14 PM By: Autumn Najjar MD Entered By: Autumn Massey on 05/05/2023 09:15:27

## 2023-05-11 ENCOUNTER — Encounter (HOSPITAL_BASED_OUTPATIENT_CLINIC_OR_DEPARTMENT_OTHER): Payer: Medicare Other | Admitting: Internal Medicine

## 2023-05-11 DIAGNOSIS — G629 Polyneuropathy, unspecified: Secondary | ICD-10-CM | POA: Diagnosis not present

## 2023-05-11 DIAGNOSIS — I87331 Chronic venous hypertension (idiopathic) with ulcer and inflammation of right lower extremity: Secondary | ICD-10-CM | POA: Diagnosis not present

## 2023-05-11 DIAGNOSIS — Z8616 Personal history of COVID-19: Secondary | ICD-10-CM | POA: Diagnosis not present

## 2023-05-11 DIAGNOSIS — E119 Type 2 diabetes mellitus without complications: Secondary | ICD-10-CM | POA: Diagnosis not present

## 2023-05-11 DIAGNOSIS — H9190 Unspecified hearing loss, unspecified ear: Secondary | ICD-10-CM | POA: Diagnosis not present

## 2023-05-11 DIAGNOSIS — L97918 Non-pressure chronic ulcer of unspecified part of right lower leg with other specified severity: Secondary | ICD-10-CM | POA: Diagnosis not present

## 2023-05-11 DIAGNOSIS — E11622 Type 2 diabetes mellitus with other skin ulcer: Secondary | ICD-10-CM | POA: Diagnosis not present

## 2023-05-11 DIAGNOSIS — L97812 Non-pressure chronic ulcer of other part of right lower leg with fat layer exposed: Secondary | ICD-10-CM | POA: Diagnosis not present

## 2023-05-11 NOTE — Progress Notes (Signed)
Autumn Massey, Autumn Massey ([id number]) [id number] Page 1 of 8 Visit Report for 05/11/2023 Arrival Information Details Patient Name: Date of Service: MATHERLY, Autumn Massey NDA Massey. 05/11/2023 8:15 A M Medical Record Number: [id number] Patient Account Number: [id number] Date of Birth/Sex: Treating RN: 12/21/1965 (57 y.o. Dola Argyle, Lyla Son Primary Care Aziz Slape: Oliver Barre Other Clinician: Referring Haruko Mersch: Treating Christine Schiefelbein/Extender: Carley Hammed in Treatment: 2 Visit Information History Since Last Visit Added or deleted any medications: No Patient Arrived: Ambulatory Any new allergies or adverse reactions: No Arrival Time: 08:26 Had a fall or experienced change in No Accompanied By: self activities of daily living that may affect Transfer Assistance: None risk of falls: Patient Identification Verified: Yes Signs or symptoms of abuse/neglect since last visito No Secondary Verification Process Completed: Yes Hospitalized since last visit: No Patient Requires Transmission-Based Precautions: No Implantable device outside of the clinic excluding No Patient Has Alerts: No cellular tissue based products placed in the center since last visit: Has Dressing in Place as Prescribed: Yes Has Compression in Place as Prescribed: Yes Pain Present Now: No Electronic Signature(s) Signed: 05/11/2023 5:15:15 PM By: Redmond Pulling RN, BSN Entered By: Redmond Pulling on 05/11/2023 05:27:12 -------------------------------------------------------------------------------- Compression Therapy Details Patient Name: Date of Service: Autumn Massey, Autumn Massey NDA Massey. 05/11/2023 8:15 A M Medical Record Number: [id number] Patient Account Number: [id number] Date of Birth/Sex: Treating RN: 05/16/1966 (57 y.o. [name] Primary Care Kapri Nero: Oliver Barre Other Clinician: Referring Elize Pinon: Treating Shaneka Efaw/Extender: Carley Hammed in Treatment: 2 Compression  Therapy Performed for Wound Assessment: Wound #3 Right,Lateral Lower Leg Performed By: Clinician Redmond Pulling, RN Compression Type: Four Layer Post Procedure Diagnosis Same as Pre-procedure Electronic Signature(s) Signed: 05/11/2023 5:15:15 PM By: Redmond Pulling RN, BSN Entered By: Redmond Pulling on 05/11/2023 06:02:56 Autumn Massey, Evanne Massey ([id number]) [id number] Page 2 of 8 -------------------------------------------------------------------------------- Encounter Discharge Information Details Patient Name: Date of Service: Autumn Massey, Autumn Massey NDA Massey. 05/11/2023 8:15 A M Medical Record Number: [id number] Patient Account Number: [id number] Date of Birth/Sex: Treating RN: 02/14/1966 (57 y.o. [name] Primary Care Farron Watrous: Oliver Barre Other Clinician: Referring Conni Knighton: Treating Bindi Klomp/Extender: Carley Hammed in Treatment: 2 Encounter Discharge Information Items Discharge Condition: Stable Ambulatory Status: Ambulatory Discharge Destination: Home Transportation: Private Auto Accompanied By: self Schedule Follow-up Appointment: Yes Clinical Summary of Care: Patient Declined Electronic Signature(s) Signed: 05/11/2023 5:15:15 PM By: Redmond Pulling RN, BSN Entered By: Redmond Pulling on 05/11/2023 06:05:34 -------------------------------------------------------------------------------- Lower Extremity Assessment Details Patient Name: Date of Service: Autumn Massey, Autumn Massey NDA Massey. 05/11/2023 8:15 A M Medical Record Number: [id number] Patient Account Number: [id number] Date of Birth/Sex: Treating RN: December 18, 1965 (57 y.o. [name] Primary Care Raveena Hebdon: Oliver Barre Other Clinician: Referring Quinlynn Cuthbert: Treating Edmund Holcomb/Extender: Carley Hammed in Treatment: 2 Edema Assessment Assessed: Kyra Searles: No] [Right: No] Edema: [Left: Ye] [Right: s] Calf Left: Right: Point of Measurement: 32 cm From Medial Instep 40 cm 39  cm Ankle Left: Right: Point of Measurement: 13 cm From Medial Instep 23 cm 22 cm Knee To Floor Left: Right: From Medial Instep 36 cm 36 cm Vascular Assessment Pulses: Dorsalis Pedis Palpable: [Right:Yes] Extremity colors, hair growth, and conditions: Extremity Color: [Right:Hyperpigmented] Hair Growth on Extremity: [Right:No] Temperature of Extremity: [Right:Warm] Capillary Refill: [Right:< 3 seconds] Dependent Rubor: [Right:No Yes] Electronic Signature(s) Signed: 05/11/2023 5:15:15 PM By: Redmond Pulling RN, BSN [place], Tayva Massey ([id number]) [id number].pdf Page 3 of 8 Entered By: Redmond Pulling on 05/11/2023 06:07:14 -------------------------------------------------------------------------------- Multi Wound Chart Details Patient Name: Date of Service: Autumn Massey, Autumn Massey  NDA Massey. 05/11/2023 8:15 A M Medical Record Number: [id number] Patient Account Number: [id number] Date of Birth/Sex: Treating RN: 1966/03/13 (57 y.o. F) Primary Care Cace Osorto: Oliver Barre Other Clinician: Referring Luisdaniel Kenton: Treating Monterius Rolf/Extender: Carley Hammed in Treatment: 2 Vital Signs Height(in): 61 Capillary Blood Glucose(mg/dl): 409 Weight(lbs): 811 Pulse(bpm): 81 Body Mass Index(BMI): 52.5 Blood Pressure(mmHg): 99/64 Temperature(F): 98.3 Respiratory Rate(breaths/min): 18 [3:Photos:] [N/A:N/A] Right, Lateral Lower Leg N/A N/A Wound Location: Gradually Appeared N/A N/A Wounding Event: Diabetic Wound/Ulcer of the Lower N/A N/A Primary Etiology: Extremity Venous Leg Ulcer N/A N/A Secondary Etiology: Asthma, Chronic Obstructive N/A N/A Comorbid History: Pulmonary Disease (COPD), Congestive Heart Failure, Hypertension, Peripheral Venous Disease, Type II Diabetes, Osteoarthritis, Neuropathy 04/06/2023 N/A N/A Date Acquired: 2 N/A N/A Weeks of Treatment: Open N/A N/A Wound Status: No N/A N/A Wound Recurrence: 0.3x0.3x0.1 N/A N/A Measurements L x W  x Massey (cm) 0.071 N/A N/A A (cm) : rea 0.007 N/A N/A Volume (cm) : 95.60% N/A N/A % Reduction in A rea: 97.80% N/A N/A % Reduction in Volume: Grade 1 N/A N/A Classification: Medium N/A N/A Exudate A mount: Serosanguineous N/A N/A Exudate Type: red, brown N/A N/A Exudate Color: Distinct, outline attached N/A N/A Wound Margin: Large (67-100%) N/A N/A Granulation A mount: Red, Pink N/A N/A Granulation Quality: Small (1-33%) N/A N/A Necrotic A mount: Fat Layer (Subcutaneous Tissue): Yes N/A N/A Exposed Structures: Fascia: No Tendon: No Muscle: No Joint: No Bone: No Medium (34-66%) N/A N/A Epithelialization: Excoriation: No N/A N/A Periwound Skin Texture: Induration: No Callus: No Crepitus: No Rash: No Scarring: No Maceration: No N/A N/A Periwound Skin Moisture: Dry/Scaly: No Hemosiderin Staining: Yes N/A N/A Periwound Skin ColorNESHA, Autumn Massey ([id number]) [id number] Page 4 of 8 Atrophie Blanche: No Cyanosis: No Ecchymosis: No Erythema: No Mottled: No Pallor: No Rubor: No No Abnormality N/A N/A Temperature: Treatment Notes Electronic Signature(s) Signed: 05/11/2023 4:37:17 PM By: Baltazar Najjar MD Entered By: Baltazar Najjar on 05/11/2023 05:46:35 -------------------------------------------------------------------------------- Multi-Disciplinary Care Plan Details Patient Name: Date of Service: Autumn Massey, Autumn Massey NDA Massey. 05/11/2023 8:15 A M Medical Record Number: [id number] Patient Account Number: [id number] Date of Birth/Sex: Treating RN: 02-27-1966 (57 y.o. [name] Primary Care Valgene Deloatch: Oliver Barre Other Clinician: Referring Rhone Ozaki: Treating Jahseh Lucchese/Extender: Carley Hammed in Treatment: 2 Active Inactive Pain, Acute or Chronic Nursing Diagnoses: Pain, acute or chronic: actual or potential Potential alteration in comfort, pain Goals: Patient will verbalize adequate pain control and receive  pain control interventions during procedures as needed Date Initiated: 04/27/2023 Target Resolution Date: 05/08/2023 Goal Status: Active Patient/caregiver will verbalize comfort level met Date Initiated: 04/27/2023 Target Resolution Date: 05/08/2023 Goal Status: Active Interventions: Encourage patient to take pain medications as prescribed Provide education on pain management Treatment Activities: Administer pain control measures as ordered : 04/27/2023 Notes: Wound/Skin Impairment Nursing Diagnoses: Knowledge deficit related to ulceration/compromised skin integrity Goals: Patient/caregiver will verbalize understanding of skin care regimen Date Initiated: 04/27/2023 Target Resolution Date: 05/08/2023 Goal Status: Active Interventions: Assess patient/caregiver ability to perform ulcer/skin care regimen upon admission and as needed Assess ulceration(s) every visit Provide education on ulcer and skin care Treatment Activities: Skin care regimen initiated : 04/27/2023 LACOYA, DOBOSZ Massey ([id number]) [id number] Page 5 of 8 Topical wound management initiated : 04/27/2023 Notes: Electronic Signature(s) Signed: 05/11/2023 5:15:15 PM By: Redmond Pulling RN, BSN Entered By: Redmond Pulling on 05/11/2023 05:41:15 -------------------------------------------------------------------------------- Pain Assessment Details Patient Name: Date of Service: Autumn Massey, Autumn Massey NDA Massey. 05/11/2023 8:15 A M Medical Record Number: [id number] Patient Account  Number: [id number] Date of Birth/Sex: Treating RN: [birth year] (57 y.o. [name] Primary Care Meyer Dockery: Oliver Barre Other Clinician: Referring Efrata Brunner: Treating Myrle Dues/Extender: Carley Hammed in Treatment: 2 Active Problems Location of Pain Severity and Description of Pain Patient Has Paino No Site Locations Pain Management and Medication Current Pain Management: Electronic Signature(s) Signed:  05/11/2023 5:15:15 PM By: Redmond Pulling RN, BSN Entered By: Redmond Pulling on 05/11/2023 05:27:30 -------------------------------------------------------------------------------- Patient/Caregiver Education Details Patient Name: Date of Service: Autumn Massey NDA Massey. 11/11/2024andnbsp8:15 A M Medical Record Number: [id number] Patient Account Number: [id number] Date of Birth/Gender: Treating RN: July 02, 1965 (57 y.o. [name] Primary Care Physician: Oliver Barre Other Clinician: Referring Physician: Treating Physician/Extender: Carley Hammed in Treatment: 2 Education Assessment [place], [place] Massey ([id number]) [id number] Page 6 of 8 Education Provided To: Patient Education Topics Provided Wound/Skin Impairment: Methods: Explain/Verbal Responses: State content correctly Electronic Signature(s) Signed: 05/11/2023 5:15:15 PM By: Redmond Pulling RN, BSN Entered By: Redmond Pulling on 05/11/2023 05:41:31 -------------------------------------------------------------------------------- Wound Assessment Details Patient Name: Date of Service: Autumn Massey, Autumn Massey NDA Massey. 05/11/2023 8:15 A M Medical Record Number: [id number] Patient Account Number: [id number] Date of Birth/Sex: Treating RN: Sep 01, 1965 (57 y.o. [name] Primary Care Loni Delbridge: Oliver Barre Other Clinician: Referring Arick Mareno: Treating Jen Eppinger/Extender: Carley Hammed in Treatment: 2 Wound Status Wound Number: 3 Primary Diabetic Wound/Ulcer of the Lower Extremity Etiology: Wound Location: Right, Lateral Lower Leg Secondary Venous Leg Ulcer Wounding Event: Gradually Appeared Etiology: Date Acquired: 04/06/2023 Wound Open Weeks Of Treatment: 2 Status: Clustered Wound: No Comorbid Asthma, Chronic Obstructive Pulmonary Disease (COPD), History: Congestive Heart Failure, Hypertension, Peripheral Venous Disease, Type II Diabetes, Osteoarthritis,  Neuropathy Photos Wound Measurements Length: (cm) 0.3 Width: (cm) 0.3 Depth: (cm) 0.1 Area: (cm) 0.071 Volume: (cm) 0.007 % Reduction in Area: 95.6% % Reduction in Volume: 97.8% Epithelialization: Medium (34-66%) Tunneling: No Undermining: No Wound Description Classification: Grade 1 Wound Margin: Distinct, outline attached Exudate Amount: Medium Exudate Type: Serosanguineous Exudate Color: red, brown Foul Odor After Cleansing: No Slough/Fibrino Yes Wound Bed Granulation Amount: Large (67-100%) Exposed Structure Granulation Quality: Red, Pink Fascia Exposed: No SAHER, HICKEL Massey ([id number]) [id number] Page 7 of 8 Necrotic Amount: Small (1-33%) Fat Layer (Subcutaneous Tissue) Exposed: Yes Necrotic Quality: Adherent Slough Tendon Exposed: No Muscle Exposed: No Joint Exposed: No Bone Exposed: No Periwound Skin Texture Texture Color No Abnormalities Noted: No No Abnormalities Noted: No Callus: No Atrophie Blanche: No Crepitus: No Cyanosis: No Excoriation: No Ecchymosis: No Induration: No Erythema: No Rash: No Hemosiderin Staining: Yes Scarring: No Mottled: No Pallor: No Moisture Rubor: No No Abnormalities Noted: No Dry / Scaly: No Temperature / Pain Maceration: No Temperature: No Abnormality Treatment Notes Wound #3 (Lower Leg) Wound Laterality: Right, Lateral Cleanser Vashe 5.8 (oz) Discharge Instruction: Cleanse the wound with Vashe prior to applying a clean dressing using gauze sponges, not tissue or cotton balls. Wound Cleanser Discharge Instruction: Cleanse the wound with wound cleanser prior to applying a clean dressing using gauze sponges, not tissue or cotton balls. Peri-Wound Care Triamcinolone 15 (g) Discharge Instruction: Use triamcinolone 15 (g) as directed Sween Lotion (Moisturizing lotion) Discharge Instruction: Apply moisturizing lotion as directed Topical Primary Dressing Hydrofera Blue Ready Transfer Foam,  2.5x2.5 (in/in) Discharge Instruction: Apply directly to wound bed as directed Secondary Dressing ABD Pad, 5x9 Discharge Instruction: Apply over primary dressing as directed. Secured With Compression Wrap Urgo K2, (equivalent to a 4 layer) two layer compression system, regular Discharge Instruction: Apply Urgo K2 as directed (  alternative to 4 layer compression). Compression Stockings Circaid Juxta Lite Compression Wrap Quantity: 1 Left Leg Compression Amount: 30-40 mmHg Right Leg Compression Amount: 30-40 mmHg Discharge Instruction: Apply Circaid Juxta Lite Compression Wrap daily as instructed. Apply first thing in the morning, remove at night before bed. Add-Ons Electronic Signature(s) Signed: 05/11/2023 5:15:15 PM By: Redmond Pulling RN, BSN Entered By: Redmond Pulling on 05/11/2023 05:35:24 Arlana Pouch Massey ([id number]) [id number] Page 8 of 8 -------------------------------------------------------------------------------- Vitals Details Patient Name: Date of Service: ARRELLANO, Autumn Massey NDA Massey. 05/11/2023 8:15 A M Medical Record Number: [id number] Patient Account Number: [id number] Date of Birth/Sex: Treating RN: 07-Oct-1965 (57 y.o. [name] Primary Care Adar Rase: Oliver Barre Other Clinician: Referring Quin Mathenia: Treating Hildegarde Dunaway/Extender: Carley Hammed in Treatment: 2 Vital Signs Time Taken: 08:26 Temperature (F): 98.3 Height (in): 61 Pulse (bpm): 81 Weight (lbs): 278 Respiratory Rate (breaths/min): 18 Body Mass Index (BMI): 52.5 Blood Pressure (mmHg): 99/64 Capillary Blood Glucose (mg/dl): 601 Reference Range: 80 - 120 mg / dl Electronic Signature(s) Signed: 05/11/2023 5:15:15 PM By: Redmond Pulling RN, BSN Entered By: Redmond Pulling on 05/11/2023 09:32:35

## 2023-05-11 NOTE — Progress Notes (Signed)
Autumn Massey, Autumn Massey ([id number]) [id number] Page 1 of 6 Visit Report for 05/11/2023 HPI Details Patient Name: Date of Service: Autumn Massey, Autumn Massey. 05/11/2023 8:15 A M Medical Record Number: [id number] Patient Account Number: [id number] Date of Birth/Sex: Treating RN: 1965/09/11 (57 y.o. F) Primary Care Provider: Oliver Barre Other Clinician: Referring Provider: Treating Provider/Extender: Carley Hammed in Treatment: 2 History of Present Illness HPI Description: ADMISSION 12/14/2020 This is a pleasant 57 year old woman who is here for our review of wound on the right lateral calf. She is a type II diabetic. She said this started when she traumatized her leg hitting it on the bed frame in February. This was painful. She was left with open wounds. She initially used topical antibiotics but found this was not happening more recently she has been using a combination of diluted hydrogen peroxide, and antibiotic pads. She has support hose but does not wear them. The patient complains of a lot of pain sometimes waking her up tonight at night with a sharp jabbing discomfort. She has neuropathy but this may not be secondary to her diabetes rather idiopathic peripheral neuropathy is also listed on her problem list. Past medical history includes type 2 diabetes not on insulin last hemoglobin A1c of 8, COVID in February 2022 with chronic shortness of breath. At 1 point she was listed as having COPD although she thinks this is more asthma, hypothyroidism, hyperlipidemia, nighttime oxygen, essential tremor hearing loss and hypertension. She had a lower extremity DVT study on 10/03/2020 and although it was a limited study because of pannus folds over her upper thighs there was no specific evidence of DVT on the right side. ABI in our clinic on the right was 0.94 6/24; patient be admitted to the clinic last week. She had 2 areas on her right lateral lower leg.  Substantial amount of erythema around both of these wounds which could have been cellulitis or contact dermatitis. I favor the latter but covered her with antibiotics in any case. She arrives in clinic today things look a lot better especially her skin. Wounds are smaller. We use silver alginate 6/29; the patient is down to 1 area on the right lateral lower leg. The erythema around this is totally resolved this was likely a contact dermatitis. I been using silver alginate 7/6; wound on the right lateral lower leg is much smaller. No erythema. Her edema control is excellent 7/13; wound on the right lower leg has epithelialized but still vulnerable especially at the superior aspect. We have been using Sorbact although I do not think Sorbact is going to be necessary here. She tells that she gets her disability next Wednesday. Will not be able to order her stockings from [employer] until then. 7/21; the area on the right posterior calf is fully epithelialized. She says she is played "phone tag" with elastic therapy she does not have new compression stockings she brings in a pair of stockings that are probably support hose and I am not really sure how old these are. READMISSION 04/27/2023 Patient is now 57 years old. She presents with a 4-week history of a wound on the right lateral lower leg in a very similar position to last time. There is no history of trauma she says she simply looked down and felt a sensation in the same area with leaking fluid. It took 3 weeks for her to get an appointment here. She has not been wearing compression stockings reliably since the last time she was here  2 years ago. Past medical history includes type 2 diabetes, hypertension, essential tremor, cervical spine disease, hypothyroidism. I do not believe she has had venous reflux studies but she had to DVT rule outs in 2017 and 2022 that did not show any acute DVT She is on Lasix 40 mg a day I think for lower extremity  edema . ABI in our clinic was 1.18 on the right 11/5; second visit on this stay in our clinic. She has a wound on the right lateral lower leg. This is smaller with healthy granulation. We have been using Hydrofera Blue under 4-layer equivalent compression. She tells me she is decided on juxta lite/external compression stockings. 11/11; the patient's wound on the right lateral lower leg is very small and superficial. We have been using Hydrofera Blue under 4-layer equivalent compression. She is decided on a JuxtaLite stocking however we are having trouble getting that through the preferred vendor of [place] [facility]. She may have to purchase these independently Electronic Signature(s) Signed: 05/11/2023 4:37:17 PM By: Baltazar Najjar MD Entered By: Baltazar Najjar on 05/11/2023 05:47:35 Physical Exam Details -------------------------------------------------------------------------------- Autumn Massey ([id number]) [phone].pdf Page 2 of 6 Patient Name: Date of Service: Autumn Massey, Autumn Massey. 05/11/2023 8:15 A M Medical Record Number: [id number] Patient Account Number: [id number] Date of Birth/Sex: Treating RN: 05-25-1966 (57 y.o. F) Primary Care Provider: Oliver Barre Other Clinician: Referring Provider: Treating Provider/Extender: Carley Hammed in Treatment: 2 Constitutional Patient is hypertensive.. Pulse regular and within target range for patient.[name] Respirations regular, non-labored and within target range.. Temperature is normal and within the target range for the patient.[name] Appears in no distress. Cardiovascular Pedal pulses are palpable. Notes Wound exam; right lateral lower leg this is small superficial. Should be healed by next week or at the latest the week after. We have good edema control. Skin changes to severe chronic venous insufficiency Electronic Signature(s) Signed: 05/11/2023 4:37:17 PM By: Baltazar Najjar MD Entered By:  Baltazar Najjar on 05/11/2023 05:49:30 -------------------------------------------------------------------------------- Physician Orders Details Patient Name: Date of Service: Autumn Massey, Autumn Massey. 05/11/2023 8:15 A M Medical Record Number: [id number] Patient Account Number: [id number] Date of Birth/Sex: Treating RN: Nov 07, 1965 (57 y.o. [name] Primary Care Provider: Oliver Barre Other Clinician: Referring Provider: Treating Provider/Extender: Carley Hammed in Treatment: 2 Verbal / Phone Orders: No Diagnosis Coding ICD-10 Coding Code Description [phone] Chronic venous hypertension (idiopathic) with ulcer and inflammation of right lower extremity L97.918 Non-pressure chronic ulcer of unspecified part of right lower leg with other specified severity Follow-up Appointments ppointment in 1 week. - Dr. Leanord Hawking 05/18/23 @ 0900 Return A ppointment in 2 weeks. - Dr. Leanord Hawking Return A Other: - Circaid Juxtalite Compression sock 30-9mmHg size M, short if ordering from [employer] Anesthetic (In clinic) Topical Lidocaine 4% applied to wound bed Bathing/ Shower/ Hygiene May shower with protection but do not get wound dressing(s) wet. Protect dressing(s) with water repellant cover (for example, large plastic bag) or a cast cover and may then take shower. Edema Control - Orders / Instructions Elevate legs to the level of the heart or above for 30 minutes daily and/or when sitting for 3-4 times a day throughout the day. Avoid standing for long periods of time. Exercise regularly Wound Treatment Wound #3 - Lower Leg Wound Laterality: Right, Lateral Cleanser: Vashe 5.8 (oz) 1 x Per Week/30 Days Discharge Instructions: Cleanse the wound with Vashe prior to applying a clean dressing using gauze sponges, not tissue or cotton balls. Cleanser: Wound Cleanser  1 x Per Week/30 Days Discharge Instructions: Cleanse the wound with wound cleanser prior to applying a clean dressing using  gauze sponges, not tissue or cotton balls. Peri-Wound Care: Triamcinolone 15 (g) 1 x Per Week/30 Days TAIDE, TAFUR Massey ([id number]) [id number] Page 3 of 6 Discharge Instructions: Use triamcinolone 15 (g) as directed Peri-Wound Care: Sween Lotion (Moisturizing lotion) 1 x Per Week/30 Days Discharge Instructions: Apply moisturizing lotion as directed Prim Dressing: Hydrofera Blue Ready Transfer Foam, 2.5x2.5 (in/in) 1 x Per Week/30 Days ary Discharge Instructions: Apply directly to wound bed as directed Secondary Dressing: ABD Pad, 5x9 1 x Per Week/30 Days Discharge Instructions: Apply over primary dressing as directed. Compression Wrap: Urgo K2, (equivalent to a 4 layer) two layer compression system, regular 1 x Per Week/30 Days Discharge Instructions: Apply Urgo K2 as directed (alternative to 4 layer compression). Compression Stockings: Circaid Juxta Lite Compression Wrap Left Leg Compression Amount: 30-40 mmHG Right Leg Compression Amount: 30-40 mmHG Discharge Instructions: Apply Circaid Juxta Lite Compression Wrap daily as instructed. Apply first thing in the morning, remove at night before bed. Patient Medications llergies: Cymbalta A Notifications Medication Indication Start End 05/11/2023 lidocaine DOSE topical 4 % cream - cream topical once daily Electronic Signature(s) Signed: 05/11/2023 4:37:17 PM By: Baltazar Najjar MD Signed: 05/11/2023 5:15:15 PM By: Redmond Pulling RN, BSN Entered By: Redmond Pulling on 05/11/2023 05:52:56 -------------------------------------------------------------------------------- Problem List Details Patient Name: Date of Service: Autumn Massey, Autumn Massey. 05/11/2023 8:15 A M Medical Record Number: [id number] Patient Account Number: [id number] Date of Birth/Sex: Treating RN: 10-30-1965 (57 y.o. [name] Primary Care Provider: Oliver Barre Other Clinician: Referring Provider: Treating Provider/Extender: Carley Hammed in Treatment: 2 Active Problems ICD-10 Encounter Code Description Active Date MDM Diagnosis I87.331 Chronic venous hypertension (idiopathic) with ulcer and inflammation of right 04/27/2023 No Yes lower extremity L97.918 Non-pressure chronic ulcer of unspecified part of right lower leg with other 04/27/2023 No Yes specified severity Inactive Problems Resolved Problems Electronic Signature(s) Signed: 05/11/2023 4:37:17 PM By: Baltazar Najjar MD [place], Pamelia Hoit Massey ([id number]) PM By: Baltazar Najjar MD [phone].pdf Page 4 of 6 Signed: 05/11/2023 4:37:17 Entered By: Baltazar Najjar on 05/11/2023 05:46:28 -------------------------------------------------------------------------------- Progress Note Details Patient Name: Date of Service: Autumn Massey, Autumn Massey. 05/11/2023 8:15 A M Medical Record Number: [id number] Patient Account Number: [id number] Date of Birth/Sex: Treating RN: [birth year] (57 y.o. F) Primary Care Provider: Oliver Barre Other Clinician: Referring Provider: Treating Provider/Extender: Carley Hammed in Treatment: 2 Subjective History of Present Illness (HPI) ADMISSION 12/14/2020 This is a pleasant 57 year old woman who is here for our review of wound on the right lateral calf. She is a type II diabetic. She said this started when she traumatized her leg hitting it on the bed frame in February. This was painful. She was left with open wounds. She initially used topical antibiotics but found this was not happening more recently she has been using a combination of diluted hydrogen peroxide, and antibiotic pads. She has support hose but does not wear them. The patient complains of a lot of pain sometimes waking her up tonight at night with a sharp jabbing discomfort. She has neuropathy but this may not be secondary to her diabetes rather idiopathic peripheral neuropathy is also listed on her problem  list. Past medical history includes type 2 diabetes not on insulin last hemoglobin A1c of 8, COVID in February 2022 with chronic shortness of breath. At 1 point she was listed as having COPD although she thinks  this is more asthma, hypothyroidism, hyperlipidemia, nighttime oxygen, essential tremor hearing loss and hypertension. She had a lower extremity DVT study on 10/03/2020 and although it was a limited study because of pannus folds over her upper thighs there was no specific evidence of DVT on the right side. ABI in our clinic on the right was 0.94 6/24; patient be admitted to the clinic last week. She had 2 areas on her right lateral lower leg. Substantial amount of erythema around both of these wounds which could have been cellulitis or contact dermatitis. I favor the latter but covered her with antibiotics in any case. She arrives in clinic today things look a lot better especially her skin. Wounds are smaller. We use silver alginate 6/29; the patient is down to 1 area on the right lateral lower leg. The erythema around this is totally resolved this was likely a contact dermatitis. I been using silver alginate 7/6; wound on the right lateral lower leg is much smaller. No erythema. Her edema control is excellent 7/13; wound on the right lower leg has epithelialized but still vulnerable especially at the superior aspect. We have been using Sorbact although I do not think Sorbact is going to be necessary here. She tells that she gets her disability next Wednesday. Will not be able to order her stockings from [employer] until then. 7/21; the area on the right posterior calf is fully epithelialized. She says she is played "phone tag" with elastic therapy she does not have new compression stockings she brings in a pair of stockings that are probably support hose and I am not really sure how old these are. READMISSION 04/27/2023 Patient is now 57 years old. She presents with a 4-week history of  a wound on the right lateral lower leg in a very similar position to last time. There is no history of trauma she says she simply looked down and felt a sensation in the same area with leaking fluid. It took 3 weeks for her to get an appointment here. She has not been wearing compression stockings reliably since the last time she was here 2 years ago. Past medical history includes type 2 diabetes, hypertension, essential tremor, cervical spine disease, hypothyroidism. I do not believe she has had venous reflux studies but she had to DVT rule outs in 2017 and 2022 that did not show any acute DVT She is on Lasix 40 mg a day I think for lower extremity edema . ABI in our clinic was 1.18 on the right 11/5; second visit on this stay in our clinic. She has a wound on the right lateral lower leg. This is smaller with healthy granulation. We have been using Hydrofera Blue under 4-layer equivalent compression. She tells me she is decided on juxta lite/external compression stockings. 11/11; the patient's wound on the right lateral lower leg is very small and superficial. We have been using Hydrofera Blue under 4-layer equivalent compression. She is decided on a JuxtaLite stocking however we are having trouble getting that through the preferred vendor of [place] [facility]. She may have to purchase these independently Objective Constitutional Patient is hypertensive.. Pulse regular and within target range for patient.[name] Respirations regular, non-labored and within target range.. Temperature is normal and within the target range for the patient.[name] Appears in no distress. Vitals Time Taken: 8:26 AM, Height: 61 in, Weight: 278 lbs, BMI: 52.5, Temperature: 98.3 F, Pulse: 81 bpm, Respiratory Rate: 18 breaths/min, Blood Pressure: 99/64 mmHg, Capillary Blood Glucose: 126 mg/dl. Autumn Massey, Autumn  Massey ([id number]) [id number] Page 5 of 6 Cardiovascular Pedal pulses are palpable. General Notes:  Wound exam; right lateral lower leg this is small superficial. Should be healed by next week or at the latest the week after. We have good edema control. Skin changes to severe chronic venous insufficiency Integumentary (Hair, Skin) Wound #3 status is Open. Original cause of wound was Gradually Appeared. The date acquired was: 04/06/2023. The wound has been in treatment 2 weeks. The wound is located on the Right,Lateral Lower Leg. The wound measures 0.3cm length x 0.3cm width x 0.1cm depth; 0.071cm^2 area and 0.007cm^3 volume. There is Fat Layer (Subcutaneous Tissue) exposed. There is no tunneling or undermining noted. There is a medium amount of serosanguineous drainage noted. The wound margin is distinct with the outline attached to the wound base. There is large (67-100%) red, pink granulation within the wound bed. There is a small (1-33%) amount of necrotic tissue within the wound bed including Adherent Slough. The periwound skin appearance exhibited: Hemosiderin Staining. The periwound skin appearance did not exhibit: Callus, Crepitus, Excoriation, Induration, Rash, Scarring, Dry/Scaly, Maceration, Atrophie Blanche, Cyanosis, Ecchymosis, Mottled, Pallor, Rubor, Erythema. Periwound temperature was noted as No Abnormality. Assessment Active Problems ICD-10 Chronic venous hypertension (idiopathic) with ulcer and inflammation of right lower extremity Non-pressure chronic ulcer of unspecified part of right lower leg with other specified severity Procedures Wound #3 Pre-procedure diagnosis of Wound #3 is a Diabetic Wound/Ulcer of the Lower Extremity located on the Right,Lateral Lower Leg . There was a Four Layer Compression Therapy Procedure by Redmond Pulling, RN. Post procedure Diagnosis Wound #3: Same as Pre-Procedure Plan Follow-up Appointments: Return Appointment in 1 week. - Dr. Leanord Hawking 05/18/23 @ 0900 Return Appointment in 2 weeks. - Dr. Leanord Hawking Other: - Circaid Juxtalite Compression sock  30-49mmHg size M, short if ordering from [employer] Anesthetic: (In clinic) Topical Lidocaine 4% applied to wound bed Bathing/ Shower/ Hygiene: May shower with protection but do not get wound dressing(s) wet. Protect dressing(s) with water repellant cover (for example, large plastic bag) or a cast cover and may then take shower. Edema Control - Orders / Instructions: Elevate legs to the level of the heart or above for 30 minutes daily and/or when sitting for 3-4 times a day throughout the day. Avoid standing for long periods of time. Exercise regularly The following medication(s) was prescribed: lidocaine topical 4 % cream cream topical once daily was prescribed at facility WOUND #3: - Lower Leg Wound Laterality: Right, Lateral Cleanser: Vashe 5.8 (oz) 1 x Per Week/30 Days Discharge Instructions: Cleanse the wound with Vashe prior to applying a clean dressing using gauze sponges, not tissue or cotton balls. Cleanser: Wound Cleanser 1 x Per Week/30 Days Discharge Instructions: Cleanse the wound with wound cleanser prior to applying a clean dressing using gauze sponges, not tissue or cotton balls. Peri-Wound Care: Triamcinolone 15 (g) 1 x Per Week/30 Days Discharge Instructions: Use triamcinolone 15 (g) as directed Peri-Wound Care: Sween Lotion (Moisturizing lotion) 1 x Per Week/30 Days Discharge Instructions: Apply moisturizing lotion as directed Prim Dressing: Hydrofera Blue Ready Transfer Foam, 2.5x2.5 (in/in) 1 x Per Week/30 Days ary Discharge Instructions: Apply directly to wound bed as directed Secondary Dressing: ABD Pad, 5x9 1 x Per Week/30 Days Discharge Instructions: Apply over primary dressing as directed. Com pression Wrap: Urgo K2, (equivalent to a 4 layer) two layer compression system, regular 1 x Per Week/30 Days Discharge Instructions: Apply Urgo K2 as directed (alternative to 4 layer compression). Com pression Stockings: Circaid Juxta Lite  Compression Wrap Compression Amount:  30-40 mmHg (left) Compression Amount: 30-40 mmHg (right) Discharge Instructions: Apply Circaid Juxta Lite Compression Wrap daily as instructed. Apply first thing in the morning, remove at night before bed. 1. Still Hydrofera Blue and Urgo K2 compression 2. She still wishes to have external compression garments/juxta lite stockings. Unfortunately we cannot seem to get this through [place] [facility] as [place], Autumn Massey ([id number]) [phone].pdf Page 6 of 6 preferred vendor [[employer]]. Electronic Signature(s) Signed: 05/13/2023 9:08:26 AM By: Shawn Stall RN, BSN Signed: 05/13/2023 10:44:47 AM By: Baltazar Najjar MD Previous Signature: 05/11/2023 4:37:17 PM Version By: Baltazar Najjar MD Entered By: Shawn Stall on 05/13/2023 06:06:53 -------------------------------------------------------------------------------- SuperBill Details Patient Name: Date of Service: Autumn Massey, Autumn Massey. 05/11/2023 Medical Record Number: [id number] Patient Account Number: [id number] Date of Birth/Sex: Treating RN: 04-Mar-1966 (57 y.o. F) Primary Care Provider: Oliver Barre Other Clinician: Referring Provider: Treating Provider/Extender: Carley Hammed in Treatment: 2 Diagnosis Coding ICD-10 Codes Code Description [phone] Chronic venous hypertension (idiopathic) with ulcer and inflammation of right lower extremity L97.918 Non-pressure chronic ulcer of unspecified part of right lower leg with other specified severity Facility Procedures : CPT4 Code: [id number] Description: (Facility Use Only) [phone] - APPLY MULTLAY COMPRS LWR RT LEG Modifier: Quantity: 1 Physician Procedures : CPT4 Code Description Modifier [id number] - WC PHYS LEVEL 3 - EST PT ICD-10 Diagnosis Description I87.331 Chronic venous hypertension (idiopathic) with ulcer and inflammation of right lower extremity L97.918 Non-pressure chronic ulcer of unspecified  part of right lower leg with other  specified severity Quantity: 1 Electronic Signature(s) Signed: 05/11/2023 4:37:17 PM By: Baltazar Najjar MD Signed: 05/11/2023 5:15:15 PM By: Redmond Pulling RN, BSN Entered By: Redmond Pulling on 05/11/2023 06:04:59

## 2023-05-18 ENCOUNTER — Encounter (HOSPITAL_BASED_OUTPATIENT_CLINIC_OR_DEPARTMENT_OTHER): Payer: Medicare Other | Admitting: Internal Medicine

## 2023-05-18 DIAGNOSIS — G6289 Other specified polyneuropathies: Secondary | ICD-10-CM | POA: Diagnosis not present

## 2023-05-18 DIAGNOSIS — H9190 Unspecified hearing loss, unspecified ear: Secondary | ICD-10-CM | POA: Diagnosis not present

## 2023-05-18 DIAGNOSIS — E119 Type 2 diabetes mellitus without complications: Secondary | ICD-10-CM | POA: Diagnosis not present

## 2023-05-18 DIAGNOSIS — I7389 Other specified peripheral vascular diseases: Secondary | ICD-10-CM | POA: Diagnosis not present

## 2023-05-18 DIAGNOSIS — L97918 Non-pressure chronic ulcer of unspecified part of right lower leg with other specified severity: Secondary | ICD-10-CM | POA: Diagnosis not present

## 2023-05-18 DIAGNOSIS — I87331 Chronic venous hypertension (idiopathic) with ulcer and inflammation of right lower extremity: Secondary | ICD-10-CM | POA: Diagnosis not present

## 2023-05-18 DIAGNOSIS — I872 Venous insufficiency (chronic) (peripheral): Secondary | ICD-10-CM | POA: Diagnosis not present

## 2023-05-18 DIAGNOSIS — Z8616 Personal history of COVID-19: Secondary | ICD-10-CM | POA: Diagnosis not present

## 2023-05-18 DIAGNOSIS — G629 Polyneuropathy, unspecified: Secondary | ICD-10-CM | POA: Diagnosis not present

## 2023-05-19 NOTE — Progress Notes (Signed)
AVAREE, DECAROLIS Massey ([id number]) [id number] Page 1 of 8 Visit Report for 05/18/2023 Arrival Information Details Patient Name: Date of Service: Autumn Massey, Autumn Massey. 05/18/2023 9:00 A M Medical Record Number: [id number] Patient Account Number: [id number] Date of Birth/Sex: Treating RN: 1966-04-01 (57 y.o. Katrinka Blazing Primary Care Anaiza Behrens: Oliver Barre Other Clinician: Referring Chontel Warning: Treating Kendrik Mcshan/Extender: Carley Hammed in Treatment: 3 Visit Information History Since Last Visit Added or deleted any medications: No Patient Arrived: Ambulatory Any new allergies or adverse reactions: No Arrival Time: 09:12 Had a fall or experienced change in No Accompanied By: self activities of daily living that may affect Transfer Assistance: None risk of falls: Patient Identification Verified: Yes Signs or symptoms of abuse/neglect since last visito No Patient Requires Transmission-Based Precautions: No Hospitalized since last visit: No Patient Has Alerts: No Implantable device outside of the clinic excluding No cellular tissue based products placed in the center since last visit: Has Dressing in Place as Prescribed: Yes Has Compression in Place as Prescribed: Yes Pain Present Now: No Electronic Signature(s) Signed: 05/19/2023 2:07:28 PM By: Karie Schwalbe RN Entered By: Karie Schwalbe on 05/18/2023 09:13:14 -------------------------------------------------------------------------------- Clinic Level of Care Assessment Details Patient Name: Date of Service: Autumn Massey, Autumn Massey. 05/18/2023 9:00 A M Medical Record Number: [id number] Patient Account Number: [id number] Date of Birth/Sex: Treating RN: 1966/05/26 (57 y.o. [name] Primary Care Nyemah Watton: Oliver Barre Other Clinician: Referring Mattalynn Crandle: Treating Christal Lagerstrom/Extender: Carley Hammed in Treatment: 3 Clinic Level of Care Assessment Items TOOL 4  Quantity Score X- 1 0 Use when only an EandM is performed on FOLLOW-UP visit ASSESSMENTS - Nursing Assessment / Reassessment X- 1 10 Reassessment of Co-morbidities (includes updates in patient status) X- 1 5 Reassessment of Adherence to Treatment Plan ASSESSMENTS - Wound and Skin A ssessment / Reassessment X - Simple Wound Assessment / Reassessment - one wound 1 5 []  - 0 Complex Wound Assessment / Reassessment - multiple wounds X- 1 10 Dermatologic / Skin Assessment (not related to wound area) ASSESSMENTS - Focused Assessment X- 1 5 Circumferential Edema Measurements - multi extremities []  - 0 Nutritional Assessment / Counseling / Intervention Autumn Massey, Autumn Massey ([id number]) [id number] Page 2 of 8 []  - 0 Lower Extremity Assessment (monofilament, tuning fork, pulses) []  - 0 Peripheral Arterial Disease Assessment (using hand held doppler) ASSESSMENTS - Ostomy and/or Continence Assessment and Care []  - 0 Incontinence Assessment and Management []  - 0 Ostomy Care Assessment and Management (repouching, etc.) PROCESS - Coordination of Care X - Simple Patient / Family Education for ongoing care 1 15 []  - 0 Complex (extensive) Patient / Family Education for ongoing care X- 1 10 Staff obtains Chiropractor, Records, T Results / Process Orders est []  - 0 Staff telephones HHA, Nursing Homes / Clarify orders / etc []  - 0 Routine Transfer to another Facility (non-emergent condition) []  - 0 Routine [facility] Admission (non-emergent condition) []  - 0 New Admissions / Manufacturing engineer / Ordering NPWT Apligraf, etc. , []  - 0 Emergency [facility] Admission (emergent condition) X- 1 10 Simple Discharge Coordination []  - 0 Complex (extensive) Discharge Coordination PROCESS - Special Needs []  - 0 Pediatric / Minor Patient Management []  - 0 Isolation Patient Management []  - 0 Hearing / Language / Visual special needs []  - 0 Assessment of Community  assistance (transportation, [facility]/C planning, etc.) []  - 0 Additional assistance / Altered mentation []  - 0 Support Surface(s) Assessment (bed, cushion, seat, etc.) INTERVENTIONS - Wound Cleansing / Measurement X -  Simple Wound Cleansing - one wound 1 5 []  - 0 Complex Wound Cleansing - multiple wounds X- 1 5 Wound Imaging (photographs - any number of wounds) []  - 0 Wound Tracing (instead of photographs) X- 1 5 Simple Wound Measurement - one wound []  - 0 Complex Wound Measurement - multiple wounds INTERVENTIONS - Wound Dressings []  - 0 Small Wound Dressing one or multiple wounds []  - 0 Medium Wound Dressing one or multiple wounds []  - 0 Large Wound Dressing one or multiple wounds []  - 0 Application of Medications - topical []  - 0 Application of Medications - injection INTERVENTIONS - Miscellaneous []  - 0 External ear exam []  - 0 Specimen Collection (cultures, biopsies, blood, body fluids, etc.) []  - 0 Specimen(s) / Culture(s) sent or taken to Lab for analysis []  - 0 Patient Transfer (multiple staff / Nurse, adult / Similar devices) []  - 0 Simple Staple / Suture removal (25 or less) []  - 0 Complex Staple / Suture removal (26 or more) []  - 0 Hypo / Hyperglycemic Management (close monitor of Blood Glucose) Autumn Massey, Autumn Massey ([id number]) [id number] Page 3 of 8 []  - 0 Ankle / Brachial Index (ABI) - do not check if billed separately X- 1 5 Vital Signs Has the patient been seen at the [facility] within the last three years: Yes Total Score: 90 Level Of Care: New/Established - Level 3 Electronic Signature(s) Signed: 05/18/2023 5:09:00 PM By: Shawn Stall RN, BSN Entered By: Shawn Stall on 05/18/2023 09:37:11 -------------------------------------------------------------------------------- Encounter Discharge Information Details Patient Name: Date of Service: Autumn [facility], Autumn Massey. 05/18/2023 9:00 A M Medical Record Number: [id number] Patient Account  Number: [id number] Date of Birth/Sex: Treating RN: 24-Apr-1966 (57 y.o. [name] Primary Care Genavive Kubicki: Oliver Barre Other Clinician: Referring Josehua Hammar: Treating Laketa Sandoz/Extender: Carley Hammed in Treatment: 3 Encounter Discharge Information Items Discharge Condition: Stable Ambulatory Status: Ambulatory Discharge Destination: Home Transportation: Private Auto Accompanied By: self Schedule Follow-up Appointment: No Clinical Summary of Care: Notes tubigrip size [facility] double layer applied to right leg. Electronic Signature(s) Signed: 05/18/2023 5:09:00 PM By: Shawn Stall RN, BSN Entered By: Shawn Stall on 05/18/2023 09:37:49 -------------------------------------------------------------------------------- Lower Extremity Assessment Details Patient Name: Date of Service: Autumn [facility], Autumn [facility]sey. 05/18/2023 9:00 A M Medical Record Number: [id number] Patient Account Number: [id number] Date of Birth/Sex: Treating RN: May 01, 1966 (57 y.o. Katrinka Blazing Primary Care Sanjith Siwek: Oliver Barre Other Clinician: Referring Efraim Vanallen: Treating Elizjah Noblet/Extender: Carley Hammed in Treatment: 3 Edema Assessment Assessed: Kyra Searles: No] [Right: No] Edema: [Left: Ye] [Right: s] Calf Left: Right: Point of Measurement: 32 cm From Medial Instep 40 cm 38.5 cm Ankle Left: Right: Point of Measurement: 13 cm From Medial Instep 23 cm 22 cm Autumn [facility]sey, Autumn [facility]sey ([id number]) [phone].pdf Page 4 of 8 Vascular Assessment Pulses: Dorsalis Pedis Palpable: [Right:Yes] Extremity colors, hair growth, and conditions: Extremity Color: [Right:Hyperpigmented] Hair Growth on Extremity: [Right:No] Temperature of Extremity: [Right:Warm] Capillary Refill: [Right:< 3 seconds] Dependent Rubor: [Right:No Yes] Electronic Signature(s) Signed: 05/19/2023 2:07:28 PM By: Karie Schwalbe RN Entered By: Karie Schwalbe on 05/18/2023  09:20:33 -------------------------------------------------------------------------------- Multi Wound Chart Details Patient Name: Date of Service: Autumn [facility], Autumn [facility]sey. 05/18/2023 9:00 A M Medical Record Number: [id number] Patient Account Number: [id number] Date of Birth/Sex: Treating RN: 03-Dec-1965 (57 y.o. F) Primary Care Pate Aylward: Oliver Barre Other Clinician: Referring Breklyn Fabrizio: Treating Binyomin Brann/Extender: Carley Hammed in Treatment: 3 Vital Signs Height(in): 61 Pulse(bpm): 76 Weight(lbs): 278 Blood Pressure(mmHg): 103/72 Body Mass Index(BMI): 52.5 Temperature(F): 98.7 Respiratory Rate(breaths/min): 18 [  3:Photos:] [N/A:N/A] Right, Lateral Lower Leg N/A N/A Wound Location: Gradually Appeared N/A N/A Wounding Event: Diabetic Wound/Ulcer of the Lower N/A N/A Primary Etiology: Extremity Venous Leg Ulcer N/A N/A Secondary Etiology: Asthma, Chronic Obstructive N/A N/A Comorbid History: Pulmonary Disease (COPD), Congestive Heart Failure, Hypertension, Peripheral Venous Disease, Type II Diabetes, Osteoarthritis, Neuropathy 04/06/2023 N/A N/A Date Acquired: 3 N/A N/A Weeks of Treatment: Open N/A N/A Wound Status: No N/A N/A Wound Recurrence: 0x0x0 N/A N/A Measurements L x W x [facility] (cm) 0 N/A N/A A (cm) : rea 0 N/A N/A Volume (cm) : 100.00% N/A N/A % Reduction in A rea: 100.00% N/A N/A % Reduction in Volume: Grade 1 N/A N/A Classification: None Present N/A N/A Exudate A mount: Distinct, outline attached N/A N/A Wound Margin: None Present (0%) N/A N/A Granulation A mountASENAT, EZEKIEL ([id number]) [id number] Page 5 of 8 None Present (0%) N/A N/A Necrotic Amount: Fascia: No N/A N/A Exposed Structures: Fat Layer (Subcutaneous Tissue): No Tendon: No Muscle: No Joint: No Bone: No Large (67-100%) N/A N/A Epithelialization: Excoriation: No N/A N/A Periwound Skin Texture: Induration: No Callus: No Crepitus:  No Rash: No Scarring: No Maceration: No N/A N/A Periwound Skin Moisture: Dry/Scaly: No Atrophie Blanche: No N/A N/A Periwound Skin Color: Cyanosis: No Ecchymosis: No Erythema: No Hemosiderin Staining: No Mottled: No Pallor: No Rubor: No No Abnormality N/A N/A Temperature: Treatment Notes Electronic Signature(s) Signed: 05/18/2023 4:31:26 PM By: Baltazar Najjar MD Entered By: Baltazar Najjar on 05/18/2023 09:43:26 -------------------------------------------------------------------------------- Multi-Disciplinary Care Plan Details Patient Name: Date of Service: Autumn Massey, Autumn Massey. 05/18/2023 9:00 A M Medical Record Number: [id number] Patient Account Number: [id number] Date of Birth/Sex: Treating RN: 04/03/1966 (57 y.o. [name] Primary Care Philamena Kramar: Oliver Barre Other Clinician: Referring Keaundre Thelin: Treating Kristyl Athens/Extender: Carley Hammed in Treatment: 3 Active Inactive Electronic Signature(s) Signed: 05/18/2023 5:09:00 PM By: Shawn Stall RN, BSN Entered By: Shawn Stall on 05/18/2023 09:32:40 -------------------------------------------------------------------------------- Pain Assessment Details Patient Name: Date of Service: Autumn Massey, Autumn Massey. 05/18/2023 9:00 A M Medical Record Number: [id number] Patient Account Number: [id number] Date of Birth/Sex: Treating RN: 02/12/1966 (57 y.o. Katrinka Blazing Primary Care Mechel Schutter: Oliver Barre Other Clinician: Referring Zhanae Proffit: Treating Marceil Welp/Extender: Carley Hammed in Treatment: 3 Active Problems Location of Pain Severity and Description of Pain RANAYA, KNOBLAUCH ([id number]) [id number] Page 6 of 8 Patient Has Paino No Site Locations Pain Management and Medication Current Pain Management: Electronic Signature(s) Signed: 05/19/2023 2:07:28 PM By: Karie Schwalbe RN Entered By: Karie Schwalbe on 05/18/2023  09:13:45 -------------------------------------------------------------------------------- Patient/Caregiver Education Details Patient Name: Date of Service: Autumn Massey. 11/18/2024andnbsp9:00 A M Medical Record Number: [id number] Patient Account Number: [id number] Date of Birth/Gender: Treating RN: April 25, 1966 (57 y.o. [name] Primary Care Physician: Oliver Barre Other Clinician: Referring Physician: Treating Physician/Extender: Carley Hammed in Treatment: 3 Education Assessment Education Provided To: Patient Education Topics Provided Venous: Handouts: Controlling Swelling with Compression Stockings Methods: Explain/Verbal Responses: Reinforcements needed Electronic Signature(s) Signed: 05/18/2023 5:09:00 PM By: Shawn Stall RN, BSN Entered By: Shawn Stall on 05/18/2023 09:33:42 -------------------------------------------------------------------------------- Wound Assessment Details Patient Name: Date of Service: Autumn [facility], Autumn Massey. 05/18/2023 9:00 A M Medical Record Number: [id number] Patient Account Number: [id number] Autumn [facility], Autumn [facility] ([id number]) [id number].pdf Page 7 of 8 Date of Birth/Sex: Treating RN: June 18, 1966 (57 y.o. Katrinka Blazing Primary Care Daysen Gundrum: Other Clinician: Oliver Barre Referring Zane Samson: Treating Taelyn Nemes/Extender: Carley Hammed in Treatment: 3 Wound Status Wound Number: 3 Primary Diabetic  Wound/Ulcer of the Lower Extremity Etiology: Wound Location: Right, Lateral Lower Leg Secondary Venous Leg Ulcer Wounding Event: Gradually Appeared Etiology: Date Acquired: 04/06/2023 Wound Open Weeks Of Treatment: 3 Status: Clustered Wound: No Comorbid Asthma, Chronic Obstructive Pulmonary Disease (COPD), History: Congestive Heart Failure, Hypertension, Peripheral Venous Disease, Type II Diabetes, Osteoarthritis, Neuropathy Photos Wound Measurements Length: (cm) Width:  (cm) Depth: (cm) Area: (cm) Volume: (cm) 0 % Reduction in Area: 100% 0 % Reduction in Volume: 100% 0 Epithelialization: Large (67-100%) 0 Tunneling: No 0 Undermining: No Wound Description Classification: Grade 1 Wound Margin: Distinct, outline attached Exudate Amount: None Present Foul Odor After Cleansing: No Slough/Fibrino No Wound Bed Granulation Amount: None Present (0%) Exposed Structure Necrotic Amount: None Present (0%) Fascia Exposed: No Fat Layer (Subcutaneous Tissue) Exposed: No Tendon Exposed: No Muscle Exposed: No Joint Exposed: No Bone Exposed: No Periwound Skin Texture Texture Color No Abnormalities Noted: No No Abnormalities Noted: No Callus: No Atrophie Blanche: No Crepitus: No Cyanosis: No Excoriation: No Ecchymosis: No Induration: No Erythema: No Rash: No Hemosiderin Staining: No Scarring: No Mottled: No Pallor: No Moisture Rubor: No No Abnormalities Noted: No Dry / Scaly: No Temperature / Pain Maceration: No Temperature: No Abnormality Electronic Signature(s) Signed: 05/19/2023 2:07:28 PM By: Karie Schwalbe RN Entered By: Karie Schwalbe on 05/18/2023 09:26:23 Arlana Pouch [facility] ([id number]) [id number] Page 8 of 8 -------------------------------------------------------------------------------- Vitals Details Patient Name: Date of Service: Autumn [facility], Autumn [facility]. 05/18/2023 9:00 A M Medical Record Number: [id number] Patient Account Number: [id number] Date of Birth/Sex: Treating RN: 03-12-1966 (56 y.o. Katrinka Blazing Primary Care Azie Mcconahy: Oliver Barre Other Clinician: Referring Garlen Reinig: Treating Antoneo Ghrist/Extender: Carley Hammed in Treatment: 3 Vital Signs Time Taken: 09:12 Temperature (F): 98.7 Height (in): 61 Pulse (bpm): 76 Weight (lbs): 278 Respiratory Rate (breaths/min): 18 Body Mass Index (BMI): 52.5 Blood Pressure (mmHg): 103/72 Reference Range: 80 - 120 mg / dl Electronic  Signature(s) Signed: 05/19/2023 2:07:28 PM By: Karie Schwalbe RN Entered By: Karie Schwalbe on 05/18/2023 09:24:23

## 2023-05-19 NOTE — Progress Notes (Signed)
ORIONNA, BINKERD D ([id number]) [id number] Page 1 of 5 Visit Report for 05/18/2023 HPI Details Patient Name: Date of Service: Autumn Massey, Autumn Massey NDA D. 05/18/2023 9:00 A M Medical Record Number: [id number] Patient Account Number: [id number] Date of Birth/Sex: Treating RN: 1966/05/15 (57 y.o. F) Primary Care Provider: Oliver Barre Other Clinician: Referring Provider: Treating Provider/Extender: Carley Hammed in Treatment: 3 History of Present Illness HPI Description: ADMISSION 12/14/2020 This is a pleasant 57 year old woman who is here for our review of wound on the right lateral calf. She is a type II diabetic. She said this started when she traumatized her leg hitting it on the bed frame in February. This was painful. She was left with open wounds. She initially used topical antibiotics but found this was not happening more recently she has been using a combination of diluted hydrogen peroxide, and antibiotic pads. She has support hose but does not wear them. The patient complains of a lot of pain sometimes waking her up tonight at night with a sharp jabbing discomfort. She has neuropathy but this may not be secondary to her diabetes rather idiopathic peripheral neuropathy is also listed on her problem list. Past medical history includes type 2 diabetes not on insulin last hemoglobin A1c of 8, COVID in February 2022 with chronic shortness of breath. At 1 point she was listed as having COPD although she thinks this is more asthma, hypothyroidism, hyperlipidemia, nighttime oxygen, essential tremor hearing loss and hypertension. She had a lower extremity DVT study on 10/03/2020 and although it was a limited study because of pannus folds over her upper thighs there was no specific evidence of DVT on the right side. ABI in our clinic on the right was 0.94 6/24; patient be admitted to the clinic last week. She had 2 areas on her right lateral lower leg.  Substantial amount of erythema around both of these wounds which could have been cellulitis or contact dermatitis. I favor the latter but covered her with antibiotics in any case. She arrives in clinic today things look a lot better especially her skin. Wounds are smaller. We use silver alginate 6/29; the patient is down to 1 area on the right lateral lower leg. The erythema around this is totally resolved this was likely a contact dermatitis. I been using silver alginate 7/6; wound on the right lateral lower leg is much smaller. No erythema. Her edema control is excellent 7/13; wound on the right lower leg has epithelialized but still vulnerable especially at the superior aspect. We have been using Sorbact although I do not think Sorbact is going to be necessary here. She tells that she gets her disability next Wednesday. Will not be able to order her stockings from [employer] until then. 7/21; the area on the right posterior calf is fully epithelialized. She says she is played "phone tag" with elastic therapy she does not have new compression stockings she brings in a pair of stockings that are probably support hose and I am not really sure how old these are. READMISSION 04/27/2023 Patient is now 57 years old. She presents with a 4-week history of a wound on the right lateral lower leg in a very similar position to last time. There is no history of trauma she says she simply looked down and felt a sensation in the same area with leaking fluid. It took 3 weeks for her to get an appointment here. She has not been wearing compression stockings reliably since the last time she was here  2 years ago. Past medical history includes type 2 diabetes, hypertension, essential tremor, cervical spine disease, hypothyroidism. I do not believe she has had venous reflux studies but she had to DVT rule outs in 2017 and 2022 that did not show any acute DVT She is on Lasix 40 mg a day I think for lower extremity  edema . ABI in our clinic was 1.18 on the right 11/5; second visit on this stay in our clinic. She has a wound on the right lateral lower leg. This is smaller with healthy granulation. We have been using Hydrofera Blue under 4-layer equivalent compression. She tells me she is decided on juxta lite/external compression stockings. 11/11; the patient's wound on the right lateral lower leg is very small and superficial. We have been using Hydrofera Blue under 4-layer equivalent compression. She is decided on a JuxtaLite stocking however we are having trouble getting that through the preferred vendor of [place] [employer]. She may have to purchase these independently 11/18; the patient's wound on the right lateral lower leg is healed. We have good edema control. The patient made a choice for juxta lite stockings however [employer] which I understand is the [facility] verification company/arm I still not approve this. The patient is familiar with [employer] from having to get approval for her nocturnal oxygen. 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 05/18/2023 9:00 A M Medical Record Number: [id number] Patient Account Number: [id number] Date of Birth/Sex: Treating RN: 1965-08-10 (57 y.o. F) Primary Care Provider: Oliver Barre Other Clinician: Referring Provider: Treating Provider/Extender: Carley Hammed in Treatment: 3 Constitutional Sitting or standing Blood Pressure is within target range for patient.. Pulse regular and within  target range for patient.[name] Respirations regular, non-labored and within target range.. Temperature is normal and within the target range for the patient.[name] Appears in no distress. Notes Wound exam; right lateral lower leg. This is completely closed. Her edema control is excellent. Skin is fragile with underlying hemosiderin deposition Electronic Signature(s) Signed: 05/18/2023 4:31:26 PM By: Baltazar Najjar MD Entered By: Baltazar Najjar on 05/18/2023 06:45:41 -------------------------------------------------------------------------------- Physician Orders Details Patient Name: Date of Service: [name], [name] 05/18/2023 9:00 A M Medical Record Number: [id number] Patient Account Number: [id number] Date of Birth/Sex: Treating RN: [birth year] (57 y.o. [name] Primary Care Provider: Oliver Barre Other Clinician: Referring Provider: Treating Provider/Extender: Carley Hammed in Treatment: 3 The following information was scribed by: Shawn Stall The information was scribed for: Baltazar Najjar Verbal / Phone Orders: No Diagnosis Coding ICD-10 Coding Code Description I87.331 Chronic venous hypertension (idiopathic) with ulcer and inflammation of right lower extremity L97.918 Non-pressure chronic ulcer of unspecified part of right lower leg with other specified severity Discharge From [facility] Services Discharge from Wound Care Center - Call if any future wound care needs. Purchase juxalite HD from [place] and [employer] about if they reimburse you. Edema Control - Orders / Instructions Elevate legs to the level of the heart or above for 30 minutes daily and/or when sitting for 3-4 times a day throughout the day. Avoid standing for long periods of time. Patient to wear own compression stockings every day. Exercise regularly  Moisturize legs daily. Compression stocking or Garment 30-40 mm/Hg pressure to: - will apply tubigrip size D double layer. apply in  the morning and remove at night. Once your juxtalite HD arrives throw aware the tubigrip. Wound Treatment Electronic Signature(s) Signed: 05/18/2023 4:31:26 PM By: Baltazar Najjar MD Signed: 05/18/2023 5:09:00 PM By: Shawn Stall RN, BSN Entered By: Shawn Stall on 05/18/2023 06:36:37 Jaycox, Druanne D ([id number]) [id number] Page 3 of 5 -------------------------------------------------------------------------------- Problem List Details Patient Name: Date of Service: GALLAS, Autumn Massey NDA D. 05/18/2023 9:00 A M Medical Record Number: [id number] Patient Account Number: [id number] Date of Birth/Sex: Treating RN: 19-Mar-1966 (57 y.o. [name], Yvonne Kendall Primary Care Provider: Oliver Barre Other Clinician: Referring Provider: Treating Provider/Extender: Carley Hammed in Treatment: 3 Active Problems ICD-10 Encounter Code Description Active Date MDM Diagnosis I87.331 Chronic venous hypertension (idiopathic) with ulcer and inflammation of right 04/27/2023 No Yes lower extremity L97.918 Non-pressure chronic ulcer of unspecified part of right lower leg with other 04/27/2023 No Yes specified severity Inactive Problems Resolved Problems Electronic Signature(s) Signed: 05/18/2023 4:31:26 PM By: Baltazar Najjar MD Entered By: Baltazar Najjar on 05/18/2023 06:43:21 -------------------------------------------------------------------------------- Progress Note Details Patient Name: Date of Service: [name], [name] 05/18/2023 9:00 A M Medical Record Number: [id number] Patient Account Number: [id number] Date of Birth/Sex: Treating RN: [birth year] (57 y.o. F) Primary Care Provider: Oliver Barre Other Clinician: Referring Provider: Treating Provider/Extender: Carley Hammed in Treatment: 3 Subjective History of Present Illness (HPI) ADMISSION 12/14/2020 This is a pleasant 57 year old woman who is here for our review of wound on  the right lateral calf. She is a type II diabetic. She said this started when she traumatized her leg hitting it on the bed frame in February. This was painful. She was left with open wounds. She initially used topical antibiotics but found this was not happening more recently she has been using a combination of diluted hydrogen peroxide, and antibiotic pads. She has support hose but does not wear them. The patient complains of a lot of pain sometimes waking her up tonight at night with a sharp jabbing discomfort. She has neuropathy but this may not be secondary to her diabetes rather idiopathic peripheral neuropathy is also listed on her problem list. Past medical history includes type 2 diabetes not on insulin last hemoglobin A1c of 8, COVID in February 2022 with chronic shortness of breath. At 1 point she was listed as having COPD although she thinks this is more asthma, hypothyroidism, hyperlipidemia, nighttime oxygen, essential tremor hearing loss and hypertension. She had a lower extremity DVT study on 10/03/2020 and although it was a limited study because of pannus folds over her upper thighs there was no specific evidence of DVT on the right side. ABI in our clinic on the right was 0.94 EMMILOU, RUHE D ([id number]) [id number] Page 4 of 5 6/24; patient be admitted to the clinic last week. She had 2 areas on her right lateral lower leg. Substantial amount of erythema around both of these wounds which could have been cellulitis or contact dermatitis. I favor the latter but covered her with antibiotics in any case. She arrives in clinic today things look a lot better especially her skin. Wounds are smaller. We use silver alginate 6/29; the patient is down to 1 area on the right lateral lower leg. The erythema around this is totally resolved this was likely a contact dermatitis. I been using silver alginate 7/6; wound on the right lateral lower leg is much smaller. No  erythema. Her edema control is excellent 7/13; wound on the right lower leg has epithelialized but still vulnerable especially at the superior aspect. We have been using Sorbact although I do not think Sorbact is going to be necessary here. She tells that she gets her disability next Wednesday. Will not be able to order her stockings from [employer] therapy until then. 7/21; the area on the right posterior calf is fully epithelialized. She says she is played "phone tag" with elastic therapy she does not have new compression stockings she brings in a pair of stockings that are probably support hose and I am not really sure how old these are. READMISSION 04/27/2023 Patient is now 57 years old. She presents with a 4-week history of a wound on the right lateral lower leg in a very similar position to last time. There is no history of trauma she says she simply looked down and felt a sensation in the same area with leaking fluid. It took 3 weeks for her to get an appointment here. She has not been wearing compression stockings reliably since the last time she was here 2 years ago. Past medical history includes type 2 diabetes, hypertension, essential tremor, cervical spine disease, hypothyroidism. I do not believe she has had venous reflux studies but she had to DVT rule outs in 2017 and 2022 that did not show any acute DVT She is on Lasix 40 mg a day I think for lower extremity edema . ABI in our clinic was 1.18 on the right 11/5; second visit on this stay in our clinic. She has a wound on the right lateral lower leg. This is smaller with healthy granulation. We have been using Hydrofera Blue under 4-layer equivalent compression. She tells me she is decided on juxta lite/external compression stockings. 11/11; the patient's wound on the right lateral lower leg is very small and superficial. We have been using Hydrofera Blue under 4-layer equivalent compression. She is decided on a JuxtaLite stocking  however we are having trouble getting that through the preferred vendor of [place] [employer]. She may have to purchase these independently 11/18; the patient's wound on the right lateral lower leg is healed. We have good edema control. The patient made a choice for juxta lite stockings however [employer] which I understand is the [facility] verification company/arm I still not approve this. The patient is familiar with [employer] from having to get approval for her nocturnal oxygen. The patient is likely to have to purchase juxta lite stockings on her own and then go to [place] with [employer] [employer] over getting reimbursement Objective Constitutional Sitting or standing Blood Pressure is within target range for patient.. Pulse regular and within target range for patient.[name] Respirations regular, non-labored and within target range.. Temperature is normal and within the target range for the patient.[name] Appears in no distress. Vitals Time Taken: 9:12 AM, Height: 61 in, Weight: 278 lbs, BMI: 52.5, Temperature: 98.7 F, Pulse: 76 bpm, Respiratory Rate: 18 breaths/min, Blood Pressure: 103/72 mmHg. General Notes: Wound exam; right lateral lower leg. This is completely closed. Her edema control is excellent. Skin is fragile with underlying hemosiderin deposition Integumentary (Hair, Skin) Wound #3 status is Open. Original cause of wound was Gradually Appeared. The date acquired was: 04/06/2023. The wound has been in treatment 3 weeks. The wound is located on the Right,Lateral Lower Leg. The wound measures 0cm length x 0cm width x 0cm depth; 0cm^2 area and 0cm^3 volume. There is no tunneling or undermining noted. There is a none  present amount of drainage noted. The wound margin is distinct with the outline attached to the wound base. There is no granulation within the wound bed. There is no necrotic tissue within the wound bed. The periwound skin appearance did not exhibit: Callus, Crepitus, Excoriation, Induration, Rash,  Scarring, Dry/Scaly, Maceration, Atrophie Blanche, Cyanosis, Ecchymosis, Hemosiderin Staining, Mottled, Pallor, Rubor, Erythema. Periwound temperature was noted as No Abnormality. Assessment Active Problems ICD-10 Chronic venous hypertension (idiopathic) with ulcer and inflammation of right lower extremity Non-pressure chronic ulcer of unspecified part of right lower leg with other specified severity Plan Discharge From [facility] Services: Discharge from Wound Care Center - Call if any future wound care needs. Purchase juxalite HD from [place] and [employer] about if they reimburse you. Edema Control - Orders / InstructionsTHURMA, WILHITE ([id number]) [phone].pdf Page 5 of 5 Elevate legs to the level of the heart or above for 30 minutes daily and/or when sitting for 3-4 times a day throughout the day. Avoid standing for long periods of time. Patient to wear own compression stockings every day. Exercise regularly Moisturize legs daily. Compression stocking or Garment 30-40 mm/Hg pressure to: - will apply tubigrip size D double layer. apply in the morning and remove at night. Once your juxtalite HD arrives throw aware the tubigrip. 1. We discharged her with bilateral Tubigrip 2. She is going to need to purchase her own juxta lite stocking. We discussed this in detail with the patient Electronic Signature(s) Signed: 05/18/2023 4:31:26 PM By: Baltazar Najjar MD Entered By: Baltazar Najjar on 05/18/2023 06:47:12 -------------------------------------------------------------------------------- SuperBill Details Patient Name: Date of Service: [name], [name] 05/18/2023 Medical Record Number: [id number] Patient Account Number: [id number] Date of Birth/Sex: Treating RN: 1965-12-17 (57 y.o. [name] Primary Care Provider: Oliver Barre Other Clinician: Referring Provider: Treating Provider/Extender: Carley Hammed in Treatment:  3 Diagnosis Coding ICD-10 Codes Code Description [phone] Chronic venous hypertension (idiopathic) with ulcer and inflammation of right lower extremity L97.918 Non-pressure chronic ulcer of unspecified part of right lower leg with other specified severity Facility Procedures : CPT4 Code: [id number] Description: 99213 - WOUND CARE VISIT-LEV 3 EST PT Modifier: Quantity: 1 Physician Procedures : CPT4 Code Description Modifier [id number] - WC PHYS LEVEL 2 - EST PT ICD-10 Diagnosis Description I87.331 Chronic venous hypertension (idiopathic) with ulcer and inflammation of right lower extremity L97.918 Non-pressure chronic ulcer of unspecified  part of right lower leg with other specified severity Quantity: 1 Electronic Signature(s) Signed: 05/18/2023 4:31:26 PM By: Baltazar Najjar MD Entered By: Baltazar Najjar on 05/18/2023 06:47:26

## 2023-05-21 ENCOUNTER — Encounter: Payer: Self-pay | Admitting: Internal Medicine

## 2023-05-21 DIAGNOSIS — E1165 Type 2 diabetes mellitus with hyperglycemia: Secondary | ICD-10-CM

## 2023-05-21 DIAGNOSIS — E559 Vitamin D deficiency, unspecified: Secondary | ICD-10-CM

## 2023-05-25 ENCOUNTER — Other Ambulatory Visit (INDEPENDENT_AMBULATORY_CARE_PROVIDER_SITE_OTHER): Payer: Medicare Other

## 2023-05-25 DIAGNOSIS — E559 Vitamin D deficiency, unspecified: Secondary | ICD-10-CM | POA: Diagnosis not present

## 2023-05-25 DIAGNOSIS — E1165 Type 2 diabetes mellitus with hyperglycemia: Secondary | ICD-10-CM

## 2023-05-25 LAB — HEMOGLOBIN A1C: Hgb A1c MFr Bld: 8.1 % — ABNORMAL HIGH (ref 4.6–6.5)

## 2023-05-26 ENCOUNTER — Ambulatory Visit (HOSPITAL_BASED_OUTPATIENT_CLINIC_OR_DEPARTMENT_OTHER): Payer: Medicare Other | Admitting: General Surgery

## 2023-05-27 ENCOUNTER — Ambulatory Visit (INDEPENDENT_AMBULATORY_CARE_PROVIDER_SITE_OTHER): Payer: Medicare Other | Admitting: Family Medicine

## 2023-05-28 LAB — VITAMIN D 1,25 DIHYDROXY
Vitamin D 1, 25 (OH)2 Total: 29 pg/mL (ref 18–72)
Vitamin D2 1, 25 (OH)2: 8 pg/mL
Vitamin D3 1, 25 (OH)2: 21 pg/mL

## 2023-06-12 ENCOUNTER — Encounter: Payer: Self-pay | Admitting: Internal Medicine

## 2023-06-29 ENCOUNTER — Ambulatory Visit (INDEPENDENT_AMBULATORY_CARE_PROVIDER_SITE_OTHER): Payer: Medicare Other

## 2023-06-29 ENCOUNTER — Encounter (INDEPENDENT_AMBULATORY_CARE_PROVIDER_SITE_OTHER): Payer: Self-pay

## 2023-06-29 ENCOUNTER — Encounter: Payer: Self-pay | Admitting: Gastroenterology

## 2023-06-29 VITALS — Ht 61.0 in | Wt 280.0 lb

## 2023-06-29 DIAGNOSIS — Z0000 Encounter for general adult medical examination without abnormal findings: Secondary | ICD-10-CM

## 2023-06-29 NOTE — Progress Notes (Signed)
Subjective:   Autumn Massey is a 57 y.o. female who presents for Medicare Annual (Subsequent) preventive examination.  Visit Complete: Virtual I connected with  Maurilio Lovely on 06/29/23 by a audio enabled telemedicine application and verified that I am speaking with the correct person using two identifiers.  Patient Location: Home  Provider Location: Office/Clinic  I discussed the limitations of evaluation and management by telemedicine. The patient expressed understanding and agreed to proceed.  Vital Signs: Because this visit was a virtual/telehealth visit, some criteria may be missing or patient reported. Any vitals not documented were not able to be obtained and vitals that have been documented are patient reported.   Cardiac Risk Factors include: advanced age (>75men, >27 women);hypertension;diabetes mellitus;dyslipidemia     Objective:    Today's Vitals   06/29/23 1308  Weight: 280 lb (127 kg)  Height: 5\' 1"  (1.549 m)  PainSc: 3    Body mass index is 52.91 kg/m.     06/29/2023    1:22 PM 04/30/2022    3:08 PM 11/05/2021    2:16 PM 06/17/2021   10:31 AM 03/06/2021    7:42 PM 01/24/2021    9:51 AM 07/23/2020    1:24 PM  Advanced Directives  Does Patient Have a Medical Advance Directive? No No No No No No No  Would patient like information on creating a medical advance directive?  No - Patient declined  Yes (MAU/Ambulatory/Procedural Areas - Information given) No - Patient declined      Current Medications (verified) Outpatient Encounter Medications as of 06/29/2023  Medication Sig   Accu-Chek Softclix Lancets lancets USE AS INSTRUCTED TO CHECK BLOOD SUGAR ONCE DAILY   albuterol (PROVENTIL) (2.5 MG/3ML) 0.083% nebulizer solution Take 3 mLs (2.5 mg total) by nebulization every 6 (six) hours as needed for wheezing or shortness of breath.   albuterol (VENTOLIN HFA) 108 (90 Base) MCG/ACT inhaler USE 2 INHALATIONS BY MOUTH EVERY 6 HOURS AS NEEDED   aspirin EC 81 MG  tablet Take 81 mg by mouth daily.   bisoprolol-hydrochlorothiazide (ZIAC) 5-6.25 MG tablet TAKE 1 TABLET BY MOUTH DAILY   Blood Glucose Monitoring Suppl (ACCU-CHEK GUIDE) w/Device KIT Use as instructed to check blood sugar 2X daily   BREO ELLIPTA 100-25 MCG/ACT AEPB USE 1 INHALATION BY MOUTH ONCE  DAILY AT THE SAME TIME EACH DAY   cholecalciferol (VITAMIN D3) 25 MCG (1000 UNIT) tablet Take 1,000 Units by mouth daily.   clonazePAM (KLONOPIN) 0.5 MG tablet Take 1 tablet (0.5 mg total) by mouth 2 (two) times daily as needed for anxiety.   FLUoxetine (PROZAC) 40 MG capsule TAKE 1 CAPSULE BY MOUTH DAILY   furosemide (LASIX) 40 MG tablet TAKE 1 TABLET BY MOUTH DAILY   Gabapentin Enacarbil (HORIZANT) 600 MG TBCR Take 1 tablet (600 mg total) by mouth at bedtime.   glucose blood (ACCU-CHEK GUIDE) test strip Use as instructed to check blood sugar 2X daily   irbesartan (AVAPRO) 75 MG tablet TAKE 1 TABLET BY MOUTH AT  BEDTIME   JARDIANCE 25 MG TABS tablet TAKE 1 TABLET BY MOUTH DAILY  BEFORE BREAKFAST   Lancets Misc. (ACCU-CHEK FASTCLIX LANCET) KIT Use to check blood sugar once a day   levothyroxine (SYNTHROID) 200 MCG tablet TAKE 1 TABLET BY MOUTH DAILY   levothyroxine (SYNTHROID) 25 MCG tablet TAKE 1 TABLET BY MOUTH DAILY  BEFORE BREAKFAST WITH 200 MCG  TABLET FOR A TOTAL DAILY DOSE OF 225 MCG   metFORMIN (GLUCOPHAGE-XR) 500 MG 24  hr tablet TAKE 1 TABLET BY MOUTH DAILY  WITH BREAKFAST   montelukast (SINGULAIR) 10 MG tablet TAKE 1 TABLET BY MOUTH AT  BEDTIME   Multiple Vitamin (MULTIVITAMIN WITH MINERALS) TABS tablet Take 1 tablet by mouth daily.   ondansetron (ZOFRAN ODT) 4 MG disintegrating tablet Take 1 tablet (4 mg total) by mouth every 8 (eight) hours as needed for nausea or vomiting.   OXYGEN 2lpm with sleep only  [facility]   primidone (MYSOLINE) 50 MG tablet TAKE 1 TABLET BY MOUTH IN THE  MORNING AND 3 TABLETS BY MOUTH  AT BEDTIME   rosuvastatin (CRESTOR) 40 MG tablet TAKE 1 TABLET BY MOUTH DAILY    Semaglutide,0.25 or 0.5MG /DOS, (OZEMPIC, 0.25 OR 0.5 MG/DOSE,) 2 MG/3ML SOPN 0.25 once wkly * 2 wks, then inc to 0.5 wkly   spironolactone (ALDACTONE) 25 MG tablet TAKE 1 TABLET BY MOUTH TWICE  DAILY   Azelastine-Fluticasone 137-50 MCG/ACT SUSP Place 1 spray into the nose every 12 (twelve) hours. (Patient not taking: Reported on 06/29/2023)   cyclobenzaprine (FLEXERIL) 5 MG tablet TAKE 1 TABLET BY MOUTH 3  TIMES DAILY AS NEEDED FOR  MUSCLE SPASM(S) (Patient not taking: Reported on 06/29/2023)   Diclofenac Sodium (PENNSAID) 2 % SOLN Place 2 g onto the skin 2 (two) times daily.   oxyCODONE (OXY IR/ROXICODONE) 5 MG immediate release tablet Take 5 mg by mouth every 6 (six) hours as needed. (Patient not taking: Reported on 06/29/2023)   No facility-administered encounter medications on file as of 06/29/2023.    Allergies (verified) Cymbalta [duloxetine hcl]   History: Past Medical History:  Diagnosis Date   Anxiety    ANXIETY DEPRESSION 01/29/2009   04/26/2019- no current   Arthritis    Asthma    CHF (congestive heart failure) (HCC)    DEPRESSION 11/19/2007   Diabetes (HCC)    Dyspnea    FREQUENCY, URINARY 10/17/2009   HOH (hard of hearing)    HYPERLIPIDEMIA 11/19/2007   HYPERTENSION 11/19/2007   HYPOTHYROIDISM 11/19/2007   Morbid obesity (HCC)    Neuropathy 01/26/2019   Neuropathy    PNEUMONIA 08/06/2010   Pneumonia    POLYCYTHEMIA 01/29/2009   Preventative health care 04/06/2011   Sleep apnea    mild sleep apnea, on o2 at 2l at nighttime   SLEEP RELATED HYPOVENTILATION/HYPOXEMIA CCE 02/08/2009   Venous insufficiency 07/19/2012   Past Surgical History:  Procedure Laterality Date   ANTERIOR CERVICAL CORPECTOMY N/A 06/19/2021   Procedure: Cerivcal Six Corpectomy;  Surgeon: Coletta Memos, MD;  Location: [facility] OR;  Service: Neurosurgery;  Laterality: N/A;   DILITATION & CURRETTAGE/HYSTROSCOPY WITH NOVASURE ABLATION N/A 04/28/2019   Procedure: DILATATION & CURETTAGE/HYSTEROSCOPY  WITH NOVASURE ABLATION;  Surgeon: Richarda Overlie, MD;  Location: [facility] OR;  Service: Gynecology;  Laterality: N/A;  [employer] rep will be here confirmed on 04/20/19   TONSILLECTOMY     ULNAR TUNNEL RELEASE Left 02/22/2013   Procedure: LEFT ULNAR NERVE DECOMPRESSION ;  Surgeon: Tami Ribas, MD;  Location: [facility];  Service: Orthopedics;  Laterality: Left;   Family History  Problem Relation Age of Onset   Cancer Mother        melanoma    Heart disease Mother    High blood pressure Mother    Stroke Mother    Thyroid disease Mother    Anxiety disorder Mother    High Cholesterol Sister    Psoriasis Brother    Osteoarthritis Maternal Grandmother 48   Scoliosis Maternal Grandmother  Cancer Maternal Grandfather        prostate cancer   Alcohol abuse Other    Arthritis Other    Hypertension Other    Social History   Socioeconomic History   Marital status: Divorced    Spouse name: Not on file   Number of children: 0   Years of education: 12   Highest education level: 12th grade  Occupational History   Occupation: Disabled    Employer: [employer]  Tobacco Use   Smoking status: Every Day    Current packs/day: 0.50    Average packs/day: 0.5 packs/day for 22.0 years (11.0 ttl pk-yrs)    Types: Cigarettes   Smokeless tobacco: Never  Vaping Use   Vaping status: Never Used  Substance and Sexual Activity   Alcohol use: Not Currently    Comment: rare   Drug use: No   Sexual activity: Never  Other Topics Concern   Not on file  Social History Narrative   Patient is right-handed.   Lives alone   Social Drivers of Health   Financial Resource Strain: Medium Risk (06/29/2023)   Overall Financial Resource Strain (CARDIA)    Difficulty of Paying Living Expenses: Somewhat hard  Food Insecurity: No Food Insecurity (06/29/2023)   Hunger Vital Sign    Worried About Running Out of Food in the Last Year: Never true    Ran Out of Food in the Last Year: Never  true  Transportation Needs: No Transportation Needs (06/29/2023)   PRAPARE - Administrator, Civil Service (Medical): No    Lack of Transportation (Non-Medical): No  Physical Activity: Insufficiently Active (06/29/2023)   Exercise Vital Sign    Days of Exercise per Week: 3 days    Minutes of Exercise per Session: 20 min  Stress: No Stress Concern Present (06/29/2023)   [employer] of Occupational Health - Occupational Stress Questionnaire    Feeling of Stress : Only a little  Social Connections: Moderately Isolated (06/29/2023)   Social Connection and Isolation Panel [NHANES]    Frequency of Communication with Friends and Family: More than three times a week    Frequency of Social Gatherings with Friends and Family: Once a week    Attends Religious Services: 1 to 4 times per year    Active Member of [employer] or Organizations: No    Attends Engineer, structural: Never    Marital Status: Divorced    Tobacco Counseling Ready to quit: Not Answered Counseling given: Not Answered   Clinical Intake:  Pre-visit preparation completed: Yes  Pain : 0-10 Pain Score: 3  Pain Location: Back (both hips) Pain Onset: More than a month ago Pain Frequency: Constant Pain Relieving Factors: Tramadol, Tylenol  Pain Relieving Factors: Tramadol, Tylenol  BMI - recorded: 52.91 Nutritional Risks: None Diabetes: Yes CBG done?: No Did pt. bring in CBG monitor from home?: No  How often do you need to have someone help you when you read instructions, pamphlets, or other written materials from your doctor or pharmacy?: 1 - Never  Interpreter Needed?: No  Information entered by :: Kenith Trickel, RMA   Activities of Daily Living    06/29/2023    1:13 PM  In your present state of health, do you have any difficulty performing the following activities:  Hearing? 1  Comment wears hearing aides  Vision? 0  Difficulty concentrating or making decisions? 0  Walking or  climbing stairs? 1  Comment due to hip and knee pain  Dressing or bathing? 0  Doing errands, shopping? 0  Preparing Food and eating ? N  Using the Toilet? N  In the past six months, have you accidently leaked urine? Y  Do you have problems with loss of bowel control? N  Managing your Medications? N  Managing your Finances? N  Housekeeping or managing your Housekeeping? N    Patient Care Team: Corwin Levins, MD as PCP - Murtis Sink, [place] as Referring Physician (Optometry)  Indicate any recent Medical Services you may have received from other than [facility] providers in the past year (date may be approximate).     Assessment:   This is a routine wellness examination for [place].  Hearing/Vision screen Hearing Screening - Comments:: Wears hearing aides Vision Screening - Comments:: Wears eyeglasses   Goals Addressed             This Visit's Progress    My goal is to continue to loss weight, make better eating choices so that I can have gastric surgery.  I feel better and my glucose is now under control.       Patient is trying to get down to not have the Gastric bypass but trying to go another route.  She has been losing weight with HWW.      Depression Screen    06/29/2023    1:28 PM 01/26/2023    2:04 PM 10/06/2022    3:27 PM 07/18/2022    3:50 PM 07/18/2022    3:35 PM 07/18/2022    3:31 PM 04/30/2022    3:18 PM  PHQ 2/9 Scores  PHQ - 2 Score 1 0 0 0 0 0 0  PHQ- 9 Score 2 0   0      Fall Risk    06/29/2023    1:22 PM 01/26/2023    2:04 PM 07/18/2022    3:50 PM 07/18/2022    3:31 PM 04/30/2022    3:09 PM  Fall Risk   Falls in the past year? 0 0 0 0 0  Number falls in past yr: 0 0 0 0 0  Injury with Fall? 0 0 0 0 0  Risk for fall due to : No Fall Risks No Fall Risks  No Fall Risks No Fall Risks  Follow up Falls prevention discussed;Falls evaluation completed Falls evaluation completed  Falls evaluation completed Falls prevention discussed    MEDICARE RISK AT  HOME: Medicare Risk at Home Any stairs in or around the home?: Yes (outside in front) If so, are there any without handrails?: Yes Home free of loose throw rugs in walkways, pet beds, electrical cords, etc?: Yes Adequate lighting in your home to reduce risk of falls?: Yes Life alert?: No Use of a cane, walker or w/c?: No Grab bars in the bathroom?: Yes Shower chair or bench in shower?: Yes Elevated toilet seat or a handicapped toilet?: Yes  TIMED UP AND GO:  Was the test performed?  No    Cognitive Function:        06/29/2023    1:23 PM 04/30/2022    3:32 PM  6CIT Screen  What Year? 0 points 0 points  What month? 0 points 0 points  What time? 0 points 0 points  Count back from 20 0 points 0 points  Months in reverse 0 points 0 points  Repeat phrase 0 points 0 points  Total Score 0 points 0 points    Immunizations Immunization History  Administered Date(s) Administered  Influenza Split 04/11/2011, 03/15/2012, 03/31/2015   Influenza Whole 02/20/2010   Influenza,inj,Quad PF,6+ Mos 08/11/2013, 03/07/2014, 03/10/2017, 03/02/2018, 04/05/2021, 05/02/2022   Pneumococcal Conjugate-13 11/22/2014   Pneumococcal Polysaccharide-23 11/04/2016   Td 01/29/2009   Tdap 01/26/2019    TDAP status: Up to date  Flu Vaccine status: Due, Education has been provided regarding the importance of this vaccine. Advised may receive this vaccine at local pharmacy or Health Dept. Aware to provide a copy of the vaccination record if obtained from local pharmacy or Health Dept. Verbalized acceptance and understanding.  Pneumococcal vaccine status: Up to date  Covid-19 vaccine status: Declined, Education has been provided regarding the importance of this vaccine but patient still declined. Advised may receive this vaccine at local pharmacy or Health Dept.or vaccine clinic. Aware to provide a copy of the vaccination record if obtained from local pharmacy or Health Dept. Verbalized acceptance and  understanding.  Qualifies for Shingles Vaccine? Yes   Zostavax completed No   Shingrix Completed?: No.    Education has been provided regarding the importance of this vaccine. Patient has been advised to call insurance company to determine out of pocket expense if they have not yet received this vaccine. Advised may also receive vaccine at local pharmacy or Health Dept. Verbalized acceptance and understanding.  Screening Tests Health Maintenance  Topic Date Due   Cervical Cancer Screening (HPV/Pap Cotest)  02/29/2012   COVID-19 Vaccine (1 - 2024-25 season) 07/15/2023 (Originally 03/01/2023)   Colonoscopy  07/18/2023 (Originally 04/30/2020)   INFLUENZA VACCINE  09/28/2023 (Originally 01/29/2023)   OPHTHALMOLOGY EXAM  09/28/2023 (Originally 06/06/2020)   Zoster Vaccines- Shingrix (1 of 2) 09/28/2023 (Originally 12/13/2015)   FOOT EXAM  07/19/2023   HEMOGLOBIN A1C  11/22/2023   Diabetic kidney evaluation - eGFR measurement  01/26/2024   Diabetic kidney evaluation - Urine ACR  01/26/2024   Medicare Annual Wellness (AWV)  06/28/2024   MAMMOGRAM  03/15/2025   DTaP/Tdap/Td (3 - Td or Tdap) 01/25/2029   Hepatitis C Screening  Completed   HIV Screening  Completed   HPV VACCINES  Aged Out    Health Maintenance  Health Maintenance Due  Topic Date Due   Cervical Cancer Screening (HPV/Pap Cotest)  02/29/2012    Colorectal cancer screening: Referral to GI placed 07/18/2022. Pt aware the office will call re: appt.  Mammogram status: Completed 03/16/2023. Repeat every year  Lung Cancer Screening: (Low Dose CT Chest recommended if Age 63-80 years, 20 pack-year currently smoking OR have quit w/in 15years.) does not qualify.   Lung Cancer Screening Referral: N/A  Additional Screening:  Hepatitis C Screening: does qualify; Completed 01/27/2020  Vision Screening: Recommended annual ophthalmology exams for early detection of glaucoma and other disorders of the eye. Is the patient up to date with  their annual eye exam?  No  Who is the provider or what is the name of the office in which the patient attends annual eye exams? Dr. Aletta Edouard If pt is not established with a provider, would they like to be referred to a provider to establish care? No .   Dental Screening: Recommended annual dental exams for proper oral hygiene  Diabetic Foot Exam: Diabetic Foot Exam: Completed 07/18/2022  Community Resource Referral / Chronic Care Management: CRR required this visit?  No   CCM required this visit?  No     Plan:     I have personally reviewed and noted the following in the patient's chart:   Medical and social history Use of  alcohol, tobacco or illicit drugs  Current medications and supplements including opioid prescriptions. Patient is not currently taking opioid prescriptions. Functional ability and status Nutritional status Physical activity Advanced directives List of other physicians Hospitalizations, surgeries, and ER visits in previous 12 months Vitals Screenings to include cognitive, depression, and falls Referrals and appointments  In addition, I have reviewed and discussed with patient certain preventive protocols, quality metrics, and best practice recommendations. A written personalized care plan for preventive services as well as general preventive health recommendations were provided to patient.     Katalyna Socarras L Quintessa Simmerman, CMA   06/29/2023   After Visit Summary: (MyChart) Due to this being a telephonic visit, the after visit summary with patients personalized plan was offered to patient via MyChart   Nurse Notes: Patient is due for a Flu and Shingrix vaccine.  She would like to get Flu vaccine during her up coming visit with Dr. Jonny Ruiz.  Patient is also due for a colonoscopy, which order was placed on 07/18/22.  Patient is aware to call and get scheduled before Jan 19th of 2025.  She is due for a diabetic eye exam, which she is hoping to have that done before her up coming  office visit with Dr. Jonny Ruiz.   She had other concerns to address today.

## 2023-06-29 NOTE — Patient Instructions (Addendum)
Autumn Massey , Thank you for taking time to come for your Medicare Wellness Visit. I appreciate your ongoing commitment to your health goals. Please review the following plan we discussed and let me know if I can assist you in the future.   Referrals/Orders/Follow-Ups/Clinician Recommendations: You are due for a Flu and Shingrix vaccine.  Remember to call [facility], for colonoscopy, at [phone].  It was nice talking to you and have a Happy New Year.   This is a list of the screening recommended for you and due dates:  Health Maintenance  Topic Date Due   Pap with HPV screening  02/29/2012   COVID-19 Vaccine (1 - 2024-25 season) 07/15/2023*   Colon Cancer Screening  07/18/2023*   Flu Shot  09/28/2023*   Eye exam for diabetics  09/28/2023*   Zoster (Shingles) Vaccine (1 of 2) 09/28/2023*   Complete foot exam   07/19/2023   Hemoglobin A1C  11/22/2023   Yearly kidney function blood test for diabetes  01/26/2024   Yearly kidney health urinalysis for diabetes  01/26/2024   Medicare Annual Wellness Visit  06/28/2024   Mammogram  03/15/2025   DTaP/Tdap/Td vaccine (3 - Td or Tdap) 01/25/2029   Hepatitis C Screening  Completed   HIV Screening  Completed   HPV Vaccine  Aged Out  *Topic was postponed. The date shown is not the original due date.    Advanced directives: (Declined) Advance directive discussed with you today. Even though you declined this today, please call our office should you change your mind, and we can give you the proper paperwork for you to fill out.  Next Medicare Annual Wellness Visit scheduled for next year: Yes

## 2023-06-30 ENCOUNTER — Other Ambulatory Visit: Payer: Self-pay | Admitting: Internal Medicine

## 2023-06-30 DIAGNOSIS — H40033 Anatomical narrow angle, bilateral: Secondary | ICD-10-CM | POA: Diagnosis not present

## 2023-06-30 DIAGNOSIS — E119 Type 2 diabetes mellitus without complications: Secondary | ICD-10-CM | POA: Diagnosis not present

## 2023-07-02 ENCOUNTER — Other Ambulatory Visit: Payer: Self-pay

## 2023-07-02 NOTE — Telephone Encounter (Signed)
 Metformin refill request complete

## 2023-07-06 ENCOUNTER — Ambulatory Visit: Payer: Medicare Other | Admitting: Internal Medicine

## 2023-07-06 NOTE — Progress Notes (Deleted)
 Patient ID: Autumn Massey, female   DOB: May 19, 1966, 58 y.o.   MRN: [id number]  HPI  Autumn Massey is a 58 y.o.-year-old female, returning for f/u for DM2, hypothyroidism, and PCOS. Last visit 4 months ago.  Interim history: She was referred to a plastic surgeon to remove her abdominal pannus.  She needs to have a BMI <45 before surgery.  Also, HbA1c has been lower than 8.  She was offered gastric bypass, but she refused.  She is now going to the weight management clinic. No increased urination, blurry vision, nausea, chest pain. She was started back on Ozempic  04/27/2023.  PCOS: Reviewed history:  - weight gain: Maximal weight 364  - was on Phentermine , which worked initially, then stopped working  - Would consider gastric bypass but only as a last resort - + hirsutism >> started to see facial hair in her 30s - + hair loss >> female pattern, and bald spot in vertex - No acne - had a Mirena IUD placed 04/2013 for heavy menstrual cycles >> out now; no menses since IUD out - has frequent Prednisone  tapers    She was previously on spironolactone  50 mg twice daily but her potassium increased so we had to decrease the dose to 25 mg twice daily.  We ended up stopping spironolactone  11/2016.  However, she started to have more facial hair and more frontal balding so we restarted spironolactone  25 mg twice a day.  Her potassium level is normal now: Lab Results  Component Value Date   K 4.0 01/26/2023   Hypothyroidism: Pt. has been dx with hypothyroidism in 2005.  Pt is on levothyroxine  (crushed) 225 mcg daily, taken: - in am - fasting - at least 3h from b'fast - no Ca, Fe - + MVI 2h later! >> moved 3-4h later - no PPIs - not on Biotin  Reviewed her TFTs: Lab Results  Component Value Date   TSH 6.17 (H) 02/26/2023   TSH 3.270 03/11/2022   TSH 2.55 07/10/2021   TSH 4.06 03/15/2021   TSH 21.97 (H) 12/06/2020   TSH 13.74 (H) 10/03/2020   TSH 42.400 (H) 09/19/2020   TSH 71.60  (H) 01/27/2020   TSH 0.80 10/20/2019   TSH 0.19 (L) 03/01/2019   FREET4 0.99 02/26/2023   FREET4 1.40 03/11/2022   FREET4 1.02 03/15/2021   FREET4 0.70 12/06/2020   FREET4 0.90 10/03/2020   FREET4 0.68 (L) 09/19/2020   FREET4 1.17 10/20/2019   FREET4 1.23 03/01/2019   FREET4 1.21 07/26/2018   FREET4 0.97 04/30/2018    Pt denies: - feeling nodules in neck - hoarseness - dysphagia - choking  She was on Neurontin  for tremors, but had to decrease the dose because of somnolence and then she was able to stop. She is on primidone  >> restarted by Dr. Skeet.  She was on Lyrica  at night >> switched to Gabapentin .  DM2: Reviewed HbA1c levels: Lab Results  Component Value Date   HGBA1C 8.1 (H) 05/25/2023   HGBA1C 7.0 (H) 01/26/2023   HGBA1C 7.0 (A) 05/02/2022  04/26/2019: HbA1c calculated from fructosamine is 5.4% 03/09/2018: HbA1c calculated from fructosamine is better, at 5.5%! 09/02/2017: HbA1c calculated from Fructosamine is 6.1%. 05/05/2017: HbA1c calculated from Fructosamine is 6.00%. 03/10/2017: HbA1c calculated from Fructosamine is 6.17% (great!). 11/2016: HbA1c calculated from the fructosamine is radically better, at 5.77%!  She is on: - Metformin  1000 mg with dinner >> 500 mg with dinner >> in am - Jardiance  10 mg in the pm -  started 02/2017 >> initially stopped b/c UTI and also had a tear on her labia >> resolved -on 25 mg daily -  >> off 2/2 decreased appetite >> restarted 04/27/2023: 0.5 mg weekly Not taking Januvia  >> not covered. She was previously on glipizide .  She was checking sugars 1-2 times a day: - am: 110-132 >> 114-138 >> 120-125 >> 100-110 - after b'fast: 153 >> n/c - lunch: n/c >> 98-191 - after lunch: 131-152 >> n/c >> 123 >> 141, 170s - dinner: n/c >> 120-140, 157 >> n/c >> 94-114 >> n/c - after dinner: 173-180 >> 149-173 >> 142-168 >> n/c Highest CBG: 188 >> 230 (b'day cake + iceceam) >> 140s.  No CKD: Lab Results  Component Value Date   BUN 24  (H) 01/26/2023   Lab Results  Component Value Date   CREATININE 0.56 01/26/2023   Lab Results  Component Value Date   MICRALBCREAT 5.0 01/26/2023   MICRALBCREAT 4.5 07/10/2021   MICRALBCREAT 31 (H) 01/27/2020   MICRALBCREAT 2.6 01/26/2019   MICRALBCREAT 0.5 11/22/2014   + HL: Lab Results  Component Value Date   CHOL 156 01/26/2023   HDL 38.50 (L) 01/26/2023   LDLCALC 54 03/11/2022   LDLDIRECT 79.0 01/26/2023   TRIG 281.0 (H) 01/26/2023   CHOLHDL 4 01/26/2023  She is on Crestor  40 mg daily.  She had an eye exam - 02/2022: No DR reportedly.  She had a foot exam 07/18/2022.  She quit smoking 05/2015.  She had a D&C on 04/28/2019.  ROS: + See HPI + Tremors   I reviewed pt's medications, allergies, PMH, social hx, family hx, and changes were documented in the history of present illness. Otherwise, unchanged from my initial visit note.  She does not have a family history of medullary thyroid  cancer or personal history of pancreatitis.  Past Medical History:  Diagnosis Date   Anxiety    ANXIETY DEPRESSION 01/29/2009   04/26/2019- no current   Arthritis    Asthma    CHF (congestive heart failure) (HCC)    DEPRESSION 11/19/2007   Diabetes (HCC)    Dyspnea    FREQUENCY, URINARY 10/17/2009   HOH (hard of hearing)    HYPERLIPIDEMIA 11/19/2007   HYPERTENSION 11/19/2007   HYPOTHYROIDISM 11/19/2007   Morbid obesity (HCC)    Neuropathy 01/26/2019   Neuropathy    PNEUMONIA 08/06/2010   Pneumonia    POLYCYTHEMIA 01/29/2009   Preventative health care 04/06/2011   Sleep apnea    mild sleep apnea, on o2 at 2l at nighttime   SLEEP RELATED HYPOVENTILATION/HYPOXEMIA CCE 02/08/2009   Venous insufficiency 07/19/2012   Past Surgical History:  Procedure Laterality Date   ANTERIOR CERVICAL CORPECTOMY N/A 06/19/2021   Procedure: Cerivcal Six Corpectomy;  Surgeon: Gillie Duncans, MD;  Location: [facility] OR;  Service: Neurosurgery;  Laterality: N/A;   DILITATION &  CURRETTAGE/HYSTROSCOPY WITH NOVASURE ABLATION N/A 04/28/2019   Procedure: DILATATION & CURETTAGE/HYSTEROSCOPY WITH NOVASURE ABLATION;  Surgeon: Johnnye Ade, MD;  Location[facility] OR;  Service: Gynecology;  Laterality: N/A;  [employer] rep will be here confirmed on 04/20/19   TONSILLECTOMY     ULNAR TUNNEL RELEASE Left 02/22/2013   Procedure: LEFT ULNAR NERVE DECOMPRESSION ;  Surgeon: Franky JONELLE Curia, MD;  Location: [facility];  Service: Orthopedics;  Laterality: Left;   Social History   Socioeconomic History   Marital status: Divorced    Spouse name: Not on file   Number of children: 0   Years of education: 29  Highest education level: 12th grade  Occupational History   Occupation: Disabled    Employer: [employer]  Tobacco Use   Smoking status: Every Day    Current packs/day: 0.50    Average packs/day: 0.5 packs/day for 22.0 years (11.0 ttl pk-yrs)    Types: Cigarettes   Smokeless tobacco: Never  Vaping Use   Vaping status: Never Used  Substance and Sexual Activity   Alcohol use: Not Currently    Comment: rare   Drug use: No   Sexual activity: Never  Other Topics Concern   Not on file  Social History Narrative   Patient is right-handed.   Lives alone   Social Drivers of Health   Financial Resource Strain: Medium Risk (06/29/2023)   Overall Financial Resource Strain (CARDIA)    Difficulty of Paying Living Expenses: Somewhat hard  Food Insecurity: No Food Insecurity (06/29/2023)   Hunger Vital Sign    Worried About Running Out of Food in the Last Year: Never true    Ran Out of Food in the Last Year: Never true  Transportation Needs: No Transportation Needs (06/29/2023)   PRAPARE - Administrator, Civil Service (Medical): No    Lack of Transportation (Non-Medical): No  Physical Activity: Insufficiently Active (06/29/2023)   Exercise Vital Sign    Days of Exercise per Week: 3 days    Minutes of Exercise per Session: 20 min  Stress: No  Stress Concern Present (06/29/2023)   [employer] of Occupational Health - Occupational Stress Questionnaire    Feeling of Stress : Only a little  Social Connections: Moderately Isolated (06/29/2023)   Social Connection and Isolation Panel [NHANES]    Frequency of Communication with Friends and Family: More than three times a week    Frequency of Social Gatherings with Friends and Family: Once a week    Attends Religious Services: 1 to 4 times per year    Active Member of [employer] or Organizations: No    Attends Banker Meetings: Never    Marital Status: Divorced  Catering Manager Violence: Patient Unable To Answer (06/29/2023)   Humiliation, Afraid, Rape, and Kick questionnaire    Fear of Current or Ex-Partner: Patient unable to answer    Emotionally Abused: Patient unable to answer    Physically Abused: Patient unable to answer    Sexually Abused: Patient unable to answer   Current Outpatient Medications on File Prior to Visit  Medication Sig Dispense Refill   Accu-Chek Softclix Lancets lancets USE AS INSTRUCTED TO CHECK BLOOD SUGAR ONCE DAILY 100 each 2   albuterol  (PROVENTIL ) (2.5 MG/3ML) 0.083% nebulizer solution Take 3 mLs (2.5 mg total) by nebulization every 6 (six) hours as needed for wheezing or shortness of breath. 75 mL 3   albuterol  (VENTOLIN  HFA) 108 (90 Base) MCG/ACT inhaler USE 2 INHALATIONS BY MOUTH EVERY 6 HOURS AS NEEDED 34 g 2   aspirin  EC 81 MG tablet Take 81 mg by mouth daily.     Azelastine -Fluticasone  137-50 MCG/ACT SUSP Place 1 spray into the nose every 12 (twelve) hours. (Patient not taking: Reported on 06/29/2023) 23 g 0   bisoprolol -hydrochlorothiazide  (ZIAC ) 5-6.25 MG tablet TAKE 1 TABLET BY MOUTH DAILY 100 tablet 2   Blood Glucose Monitoring Suppl (ACCU-CHEK GUIDE) w/Device KIT Use as instructed to check blood sugar 2X daily 1 kit 0   BREO ELLIPTA  100-25 MCG/ACT AEPB USE 1 INHALATION BY MOUTH ONCE  DAILY AT THE SAME TIME EACH DAY 180 each 3  cholecalciferol  (VITAMIN D3) 25 MCG (1000 UNIT) tablet Take 1,000 Units by mouth daily.     clonazePAM  (KLONOPIN ) 0.5 MG tablet Take 1 tablet (0.5 mg total) by mouth 2 (two) times daily as needed for anxiety. 60 tablet 5   cyclobenzaprine  (FLEXERIL ) 5 MG tablet TAKE 1 TABLET BY MOUTH 3  TIMES DAILY AS NEEDED FOR  MUSCLE SPASM(S) (Patient not taking: Reported on 06/29/2023) 270 tablet 1   Diclofenac  Sodium (PENNSAID ) 2 % SOLN Place 2 g onto the skin 2 (two) times daily. 112 g 3   FLUoxetine  (PROZAC ) 40 MG capsule TAKE 1 CAPSULE BY MOUTH DAILY 100 capsule 3   furosemide  (LASIX ) 40 MG tablet TAKE 1 TABLET BY MOUTH DAILY 90 tablet 1   Gabapentin  Enacarbil (HORIZANT ) 600 MG TBCR Take 1 tablet (600 mg total) by mouth at bedtime. 90 tablet 3   glucose blood (ACCU-CHEK GUIDE) test strip Use as instructed to check blood sugar 2X daily 200 each 3   irbesartan  (AVAPRO ) 75 MG tablet TAKE 1 TABLET BY MOUTH AT  BEDTIME 100 tablet 2   JARDIANCE  25 MG TABS tablet TAKE 1 TABLET BY MOUTH DAILY  BEFORE BREAKFAST 100 tablet 2   Lancets Misc. (ACCU-CHEK FASTCLIX LANCET) KIT Use to check blood sugar once a day 1 kit 0   levothyroxine  (SYNTHROID ) 200 MCG tablet TAKE 1 TABLET BY MOUTH DAILY 100 tablet 2   levothyroxine  (SYNTHROID ) 25 MCG tablet TAKE 1 TABLET BY MOUTH DAILY  BEFORE BREAKFAST WITH 200 MCG  TABLET FOR A TOTAL DAILY DOSE OF 225 MCG 100 tablet 2   metFORMIN  (GLUCOPHAGE -XR) 500 MG 24 hr tablet TAKE 1 TABLET BY MOUTH DAILY  WITH BREAKFAST 100 tablet 2   montelukast  (SINGULAIR ) 10 MG tablet TAKE 1 TABLET BY MOUTH AT  BEDTIME 100 tablet 3   Multiple Vitamin (MULTIVITAMIN WITH MINERALS) TABS tablet Take 1 tablet by mouth daily.     ondansetron  (ZOFRAN  ODT) 4 MG disintegrating tablet Take 1 tablet (4 mg total) by mouth every 8 (eight) hours as needed for nausea or vomiting. 4 tablet 0   oxyCODONE  (OXY IR/ROXICODONE ) 5 MG immediate release tablet Take 5 mg by mouth every 6 (six) hours as needed. (Patient not taking:  Reported on 06/29/2023)     OXYGEN  2lpm with sleep only  [facility]     primidone  (MYSOLINE ) 50 MG tablet TAKE 1 TABLET BY MOUTH IN THE  MORNING AND 3 TABLETS BY MOUTH  AT BEDTIME 360 tablet 3   rosuvastatin  (CRESTOR ) 40 MG tablet TAKE 1 TABLET BY MOUTH DAILY 100 tablet 2   Semaglutide ,0.25 or 0.5MG /DOS, (OZEMPIC , 0.25 OR 0.5 MG/DOSE,) 2 MG/3ML SOPN 0.25 once wkly * 2 wks, then inc to 0.5 wkly     spironolactone  (ALDACTONE ) 25 MG tablet TAKE 1 TABLET BY MOUTH TWICE  DAILY 200 tablet 2   No current facility-administered medications on file prior to visit.   Allergies  Allergen Reactions   Cymbalta  [Duloxetine  Hcl]     Altered mental state / agitated    Family History  Problem Relation Age of Onset   Cancer Mother        melanoma    Heart disease Mother    High blood pressure Mother    Stroke Mother    Thyroid  disease Mother    Anxiety disorder Mother    High Cholesterol Sister    Psoriasis Brother    Osteoarthritis Maternal Grandmother 69   Scoliosis Maternal Grandmother    Cancer Maternal Grandfather  prostate cancer   Alcohol abuse Other    Arthritis Other    Hypertension Other    PE: There were no vitals taken for this visit. Wt Readings from Last 3 Encounters:  06/29/23 280 lb (127 kg)  04/27/23 280 lb (127 kg)  03/23/23 281 lb (127.5 kg)   Constitutional: overweight, in NAD Eyes: no exophthalmos ENT: no thyromegaly, no cervical lymphadenopathy Cardiovascular: RRR, No MRG Respiratory: CTA B Musculoskeletal: no deformities Skin:  + stasis dermatitis rashes on lower legs R>L Neurological: no tremor with outstretched hands  PLAN:  1. Patient with longstanding hypothyroidism -Previously uncontrolled -Last year, she had very high TSH levels, 71 and 42 respectively, despite a high dose of levothyroxine .  I was not aware of these results, but I became aware of her TSH from 09/2020 which improved to 13.7.  Afterwards, we discussed about the need for compliance and also  advised her to crush the levothyroxine  tablet, after which her TFTs normalized, but at last visit, the level was again elevated: - latest thyroid  labs reviewed with pt. >> normal: Lab Results  Component Value Date   TSH 6.17 (H) 02/26/2023  - she continues on LT4 225 mcg daily (crushed) - After the above results returned, I advised her to continue the same dose but come back for repeat labs.  However, she did not return for these - pt feels good on this dose. - we discussed about taking the thyroid  hormone every day, with water, >30 minutes before breakfast, separated by >4 hours from acid reflux medications, calcium , iron, multivitamins. Pt. is taking it correctly. - will check thyroid  tests today: TSH and fT4 - If labs are abnormal, she will need to return for repeat TFTs in 1.5 months  2. PCOS -She aged out her PCOS diagnosis -However, she continues to have increased facial hair and alopecia and continue spironolactone  25 mg twice a day, as she feels that this is helping -Latest potassium level was normal: Lab Results  Component Value Date   K 4.0 01/26/2023  -We cannot increase the spironolactone  dose further due to previous hyperkalemia with a higher doses  3. DM2 -She continues on metformin  low-dose and also Jardiance .  She joined the weight management clinic in the past but she had to come off Ozempic  afterwards as she was not able to eat on the recommended food.  States that sugars were at goal, we continued her only on metformin  and Jardiance .  At last visit, she was not checking blood sugars as she was grieving after her mom and had a lot of stress, but plans to restart.  HbA1c before the visit was stable, at 7.0%.  We discussed about the importance of checking blood sugars.  However, an HbA1c obtained 1.5 months ago was even higher, at 8.1%.  - I suggested: Patient Instructions  Please continue: - Metformin  ER 500 mg with breakfast - Jardiance  25 mg before breakfast - Ozempic   0.5 mg weekly   Please continue the crushed levothyroxine  225 g daily.   Take the thyroid  hormone every day, with water, at least 30 minutes before breakfast, separated by at least 4 hours from: - acid reflux medications - calcium  - iron - multivitamins   Continue spironolactone  25 mg twice a day.   Please return in 6 months.  - we checked her HbA1c: 7%  - advised to check sugars at different times of the day - 1x a day, rotating check times - advised for yearly eye exams >> she  is UTD - return to clinic in 6 months  4.  Obesity class III -Continues Jardiance  but came off Ozempic  before last visit due to decreased appetite -In the past, she was doing a partially keto diet with intermittent fasting 16-8 -She gained a significant amount of weight during her COVID infection at the beginning of 2022 -Afterwards, she started hand weights and chair exercises -She then started to see the weight management clinic and lost 12 pounds -She is planning to have abdominal pannus surgery but BMI needs to be lower than 45 -At last visit, weight was stable -We discussed about returning to see the weight management clinic  Lela Fendt, MD PhD [facility]

## 2023-07-07 ENCOUNTER — Ambulatory Visit: Payer: Medicare Other | Admitting: Internal Medicine

## 2023-07-07 ENCOUNTER — Ambulatory Visit (INDEPENDENT_AMBULATORY_CARE_PROVIDER_SITE_OTHER): Payer: Medicare Other | Admitting: Family Medicine

## 2023-07-07 NOTE — Progress Notes (Deleted)
 Patient ID: [name], female   DOB: [birth year], 58 y.o.   MRN: [id number] This note was precharted 07/06/2023.  HPI  Autumn Massey is a 58 y.o.-year-old female, returning for f/u for DM2, hypothyroidism, and PCOS. Last visit 4 months ago.  Interim history: She was referred to a plastic surgeon to remove her abdominal pannus.  She needs to have a BMI <45 before surgery.  Also, HbA1c has been lower than 8.  She was offered gastric bypass, but she refused.  She is now going to the weight management clinic. No increased urination, blurry vision, nausea, chest pain. She was started back on Ozempic  04/27/2023.  PCOS: Reviewed history:  - weight gain: Maximal weight 364  - was on Phentermine , which worked initially, then stopped working  - Would consider gastric bypass but only as a last resort - + hirsutism >> started to see facial hair in her 30s - + hair loss >> female pattern, and bald spot in vertex - No acne - had a Mirena IUD placed 04/2013 for heavy menstrual cycles >> out now; no menses since IUD out - has frequent Prednisone  tapers    She was previously on spironolactone  50 mg twice daily but her potassium increased so we had to decrease the dose to 25 mg twice daily.  We ended up stopping spironolactone  11/2016.  However, she started to have more facial hair and more frontal balding so we restarted spironolactone  25 mg twice a day.  Her potassium level is normal now: Lab Results  Component Value Date   K 4.0 01/26/2023   Hypothyroidism: Pt. has been dx with hypothyroidism in 2005.  Pt is on levothyroxine  (crushed) 225 mcg daily, taken: - in am - fasting - at least 3h from b'fast - no Ca, Fe - + MVI 2h later! >> moved 3-4h later - no PPIs - not on Biotin  Reviewed her TFTs: Lab Results  Component Value Date   TSH 6.17 (H) 02/26/2023   TSH 3.270 03/11/2022   TSH 2.55 07/10/2021   TSH 4.06 03/15/2021   TSH 21.97 (H) 12/06/2020   TSH 13.74 (H) 10/03/2020   TSH  42.400 (H) 09/19/2020   TSH 71.60 (H) 01/27/2020   TSH 0.80 10/20/2019   TSH 0.19 (L) 03/01/2019   FREET4 0.99 02/26/2023   FREET4 1.40 03/11/2022   FREET4 1.02 03/15/2021   FREET4 0.70 12/06/2020   FREET4 0.90 10/03/2020   FREET4 0.68 (L) 09/19/2020   FREET4 1.17 10/20/2019   FREET4 1.23 03/01/2019   FREET4 1.21 07/26/2018   FREET4 0.97 04/30/2018    Pt denies: - feeling nodules in neck - hoarseness - dysphagia - choking  She was on Neurontin  for tremors, but had to decrease the dose because of somnolence and then she was able to stop. She is on primidone  >> restarted by Dr. Skeet.  She was on Lyrica  at night >> switched to Gabapentin .  DM2: Reviewed HbA1c levels: Lab Results  Component Value Date   HGBA1C 8.1 (H) 05/25/2023   HGBA1C 7.0 (H) 01/26/2023   HGBA1C 7.0 (A) 05/02/2022  04/26/2019: HbA1c calculated from fructosamine is 5.4% 03/09/2018: HbA1c calculated from fructosamine is better, at 5.5%! 09/02/2017: HbA1c calculated from Fructosamine is 6.1%. 05/05/2017: HbA1c calculated from Fructosamine is 6.00%. 03/10/2017: HbA1c calculated from Fructosamine is 6.17% (great!). 11/2016: HbA1c calculated from the fructosamine is radically better, at 5.77%!  She is on: - Metformin  1000 mg with dinner >> 500 mg with dinner >> in am - Jardiance   10 mg in the pm -  started 02/2017 >> initially stopped b/c UTI and also had a tear on her labia >> resolved -on 25 mg daily -  >> off 2/2 decreased appetite >> restarted 04/27/2023: 0.5 mg weekly Not taking Januvia  >> not covered. She was previously on glipizide .  She was checking sugars 1-2 times a day: - am: 110-132 >> 114-138 >> 120-125 >> 100-110 - after b'fast: 153 >> n/c - lunch: n/c >> 98-191 - after lunch: 131-152 >> n/c >> 123 >> 141, 170s - dinner: n/c >> 120-140, 157 >> n/c >> 94-114 >> n/c - after dinner: 173-180 >> 149-173 >> 142-168 >> n/c Highest CBG: 188 >> 230 (b'day cake + iceceam) >> 140s.  No CKD: Lab Results   Component Value Date   BUN 24 (H) 01/26/2023   Lab Results  Component Value Date   CREATININE 0.56 01/26/2023   Lab Results  Component Value Date   MICRALBCREAT 5.0 01/26/2023   MICRALBCREAT 4.5 07/10/2021   MICRALBCREAT 31 (H) 01/27/2020   MICRALBCREAT 2.6 01/26/2019   MICRALBCREAT 0.5 11/22/2014   + HL: Lab Results  Component Value Date   CHOL 156 01/26/2023   HDL 38.50 (L) 01/26/2023   LDLCALC 54 03/11/2022   LDLDIRECT 79.0 01/26/2023   TRIG 281.0 (H) 01/26/2023   CHOLHDL 4 01/26/2023  She is on Crestor  40 mg daily.  She had an eye exam - 02/2022: No DR reportedly.  She had a foot exam 07/18/2022.  She quit smoking 05/2015.  She had a D&C on 04/28/2019.  ROS: + See HPI + Tremors   I reviewed pt's medications, allergies, PMH, social hx, family hx, and changes were documented in the history of present illness. Otherwise, unchanged from my initial visit note.  She does not have a family history of medullary thyroid  cancer or personal history of pancreatitis.  Past Medical History:  Diagnosis Date   Anxiety    ANXIETY DEPRESSION 01/29/2009   04/26/2019- no current   Arthritis    Asthma    CHF (congestive heart failure) (HCC)    DEPRESSION 11/19/2007   Diabetes (HCC)    Dyspnea    FREQUENCY, URINARY 10/17/2009   HOH (hard of hearing)    HYPERLIPIDEMIA 11/19/2007   HYPERTENSION 11/19/2007   HYPOTHYROIDISM 11/19/2007   Morbid obesity (HCC)    Neuropathy 01/26/2019   Neuropathy    PNEUMONIA 08/06/2010   Pneumonia    POLYCYTHEMIA 01/29/2009   Preventative health care 04/06/2011   Sleep apnea    mild sleep apnea, on o2 at 2l at nighttime   SLEEP RELATED HYPOVENTILATION/HYPOXEMIA CCE 02/08/2009   Venous insufficiency 07/19/2012   Past Surgical History:  Procedure Laterality Date   ANTERIOR CERVICAL CORPECTOMY N/A 06/19/2021   Procedure: Cerivcal Six Corpectomy;  Surgeon: Gillie Duncans, MD;  Location: [facility];  Service: Neurosurgery;  Laterality: N/A;    DILITATION & CURRETTAGE/HYSTROSCOPY WITH NOVASURE ABLATION N/A 04/28/2019   Procedure: DILATATION & CURETTAGE/HYSTEROSCOPY WITH NOVASURE ABLATION;  Surgeon: Johnnye Ade, MD;  Location: [facility] OR;  Service: Gynecology;  Laterality: N/A;  [employer] rep will be here confirmed on 04/20/19   TONSILLECTOMY     ULNAR TUNNEL RELEASE Left 02/22/2013   Procedure: LEFT ULNAR NERVE DECOMPRESSION ;  Surgeon: Franky JONELLE Curia, MD;  Location: [facility];  Service: Orthopedics;  Laterality: Left;   Social History   Socioeconomic History   Marital status: Divorced    Spouse name: Not on file   Number of children:  0   Years of education: 39   Highest education level: 12th grade  Occupational History   Occupation: Disabled    Employer: [employer]  Tobacco Use   Smoking status: Every Day    Current packs/day: 0.50    Average packs/day: 0.5 packs/day for 22.0 years (11.0 ttl pk-yrs)    Types: Cigarettes   Smokeless tobacco: Never  Vaping Use   Vaping status: Never Used  Substance and Sexual Activity   Alcohol use: Not Currently    Comment: rare   Drug use: No   Sexual activity: Never  Other Topics Concern   Not on file  Social History Narrative   Patient is right-handed.   Lives alone   Social Drivers of Health   Financial Resource Strain: Medium Risk (06/29/2023)   Overall Financial Resource Strain (CARDIA)    Difficulty of Paying Living Expenses: Somewhat hard  Food Insecurity: No Food Insecurity (06/29/2023)   Hunger Vital Sign    Worried About Running Out of Food in the Last Year: Never true    Ran Out of Food in the Last Year: Never true  Transportation Needs: No Transportation Needs (06/29/2023)   PRAPARE - Administrator, Civil Service (Medical): No    Lack of Transportation (Non-Medical): No  Physical Activity: Insufficiently Active (06/29/2023)   Exercise Vital Sign    Days of Exercise per Week: 3 days    Minutes of Exercise per Session: 20 min   Stress: No Stress Concern Present (06/29/2023)   [employer] of Occupational Health - Occupational Stress Questionnaire    Feeling of Stress : Only a little  Social Connections: Moderately Isolated (06/29/2023)   Social Connection and Isolation Panel [NHANES]    Frequency of Communication with Friends and Family: More than three times a week    Frequency of Social Gatherings with Friends and Family: Once a week    Attends Religious Services: 1 to 4 times per year    Active Member of [employer] or Organizations: No    Attends Banker Meetings: Never    Marital Status: Divorced  Catering Manager Violence: Patient Unable To Answer (06/29/2023)   Humiliation, Afraid, Rape, and Kick questionnaire    Fear of Current or Ex-Partner: Patient unable to answer    Emotionally Abused: Patient unable to answer    Physically Abused: Patient unable to answer    Sexually Abused: Patient unable to answer   Current Outpatient Medications on File Prior to Visit  Medication Sig Dispense Refill   Accu-Chek Softclix Lancets lancets USE AS INSTRUCTED TO CHECK BLOOD SUGAR ONCE DAILY 100 each 2   albuterol  (PROVENTIL ) (2.5 MG/3ML) 0.083% nebulizer solution Take 3 mLs (2.5 mg total) by nebulization every 6 (six) hours as needed for wheezing or shortness of breath. 75 mL 3   albuterol  (VENTOLIN  HFA) 108 (90 Base) MCG/ACT inhaler USE 2 INHALATIONS BY MOUTH EVERY 6 HOURS AS NEEDED 34 g 2   aspirin  EC 81 MG tablet Take 81 mg by mouth daily.     Azelastine -Fluticasone  137-50 MCG/ACT SUSP Place 1 spray into the nose every 12 (twelve) hours. (Patient not taking: Reported on 06/29/2023) 23 g 0   bisoprolol -hydrochlorothiazide  (ZIAC ) 5-6.25 MG tablet TAKE 1 TABLET BY MOUTH DAILY 100 tablet 2   Blood Glucose Monitoring Suppl (ACCU-CHEK GUIDE) w/Device KIT Use as instructed to check blood sugar 2X daily 1 kit 0   BREO ELLIPTA  100-25 MCG/ACT AEPB USE 1 INHALATION BY MOUTH ONCE  DAILY AT  THE SAME TIME EACH DAY  180 each 3   cholecalciferol  (VITAMIN D3) 25 MCG (1000 UNIT) tablet Take 1,000 Units by mouth daily.     clonazePAM  (KLONOPIN ) 0.5 MG tablet Take 1 tablet (0.5 mg total) by mouth 2 (two) times daily as needed for anxiety. 60 tablet 5   cyclobenzaprine  (FLEXERIL ) 5 MG tablet TAKE 1 TABLET BY MOUTH 3  TIMES DAILY AS NEEDED FOR  MUSCLE SPASM(S) (Patient not taking: Reported on 06/29/2023) 270 tablet 1   Diclofenac  Sodium (PENNSAID ) 2 % SOLN Place 2 g onto the skin 2 (two) times daily. 112 g 3   FLUoxetine  (PROZAC ) 40 MG capsule TAKE 1 CAPSULE BY MOUTH DAILY 100 capsule 3   furosemide  (LASIX ) 40 MG tablet TAKE 1 TABLET BY MOUTH DAILY 90 tablet 1   Gabapentin  Enacarbil (HORIZANT ) 600 MG TBCR Take 1 tablet (600 mg total) by mouth at bedtime. 90 tablet 3   glucose blood (ACCU-CHEK GUIDE) test strip Use as instructed to check blood sugar 2X daily 200 each 3   irbesartan  (AVAPRO ) 75 MG tablet TAKE 1 TABLET BY MOUTH AT  BEDTIME 100 tablet 2   JARDIANCE  25 MG TABS tablet TAKE 1 TABLET BY MOUTH DAILY  BEFORE BREAKFAST 100 tablet 2   Lancets Misc. (ACCU-CHEK FASTCLIX LANCET) KIT Use to check blood sugar once a day 1 kit 0   levothyroxine  (SYNTHROID ) 200 MCG tablet TAKE 1 TABLET BY MOUTH DAILY 100 tablet 2   levothyroxine  (SYNTHROID ) 25 MCG tablet TAKE 1 TABLET BY MOUTH DAILY  BEFORE BREAKFAST WITH 200 MCG  TABLET FOR A TOTAL DAILY DOSE OF 225 MCG 100 tablet 2   metFORMIN  (GLUCOPHAGE -XR) 500 MG 24 hr tablet TAKE 1 TABLET BY MOUTH DAILY  WITH BREAKFAST 100 tablet 2   montelukast  (SINGULAIR ) 10 MG tablet TAKE 1 TABLET BY MOUTH AT  BEDTIME 100 tablet 3   Multiple Vitamin (MULTIVITAMIN WITH MINERALS) TABS tablet Take 1 tablet by mouth daily.     ondansetron  (ZOFRAN  ODT) 4 MG disintegrating tablet Take 1 tablet (4 mg total) by mouth every 8 (eight) hours as needed for nausea or vomiting. 4 tablet 0   oxyCODONE  (OXY IR/ROXICODONE ) 5 MG immediate release tablet Take 5 mg by mouth every 6 (six) hours as needed. (Patient  not taking: Reported on 06/29/2023)     OXYGEN  2lpm with sleep only  [facility]     primidone  (MYSOLINE ) 50 MG tablet TAKE 1 TABLET BY MOUTH IN THE  MORNING AND 3 TABLETS BY MOUTH  AT BEDTIME 360 tablet 3   rosuvastatin  (CRESTOR ) 40 MG tablet TAKE 1 TABLET BY MOUTH DAILY 100 tablet 2   Semaglutide ,0.25 or 0.5MG /DOS, (OZEMPIC , 0.25 OR 0.5 MG/DOSE,) 2 MG/3ML SOPN 0.25 once wkly * 2 wks, then inc to 0.5 wkly     spironolactone  (ALDACTONE ) 25 MG tablet TAKE 1 TABLET BY MOUTH TWICE  DAILY 200 tablet 2   No current facility-administered medications on file prior to visit.   Allergies  Allergen Reactions   Cymbalta  [Duloxetine  Hcl]     Altered mental state / agitated    Family History  Problem Relation Age of Onset   Cancer Mother        melanoma    Heart disease Mother    High blood pressure Mother    Stroke Mother    Thyroid  disease Mother    Anxiety disorder Mother    High Cholesterol Sister    Psoriasis Brother    Osteoarthritis Maternal Grandmother 65  Scoliosis Maternal Grandmother    Cancer Maternal Grandfather        prostate cancer   Alcohol abuse Other    Arthritis Other    Hypertension Other    PE: There were no vitals taken for this visit. Wt Readings from Last 3 Encounters:  06/29/23 280 lb (127 kg)  04/27/23 280 lb (127 kg)  03/23/23 281 lb (127.5 kg)   Constitutional: overweight, in NAD Eyes: no exophthalmos ENT: no thyromegaly, no cervical lymphadenopathy Cardiovascular: RRR, No MRG Respiratory: CTA B Musculoskeletal: no deformities Skin:  + stasis dermatitis rashes on lower legs R>L Neurological: no tremor with outstretched hands  PLAN:  1. Patient with longstanding hypothyroidism -Previously uncontrolled -Last year, she had very high TSH levels, 71 and 42 respectively, despite a high dose of levothyroxine .  I was not aware of these results, but I became aware of her TSH from 09/2020 which improved to 13.7.  Afterwards, we discussed about the need for  compliance and also advised her to crush the levothyroxine  tablet, after which her TFTs normalized, but at last visit, the level was again elevated: - latest thyroid  labs reviewed with pt. >> normal: Lab Results  Component Value Date   TSH 6.17 (H) 02/26/2023  - she continues on LT4 225 mcg daily (crushed) - After the above results returned, I advised her to continue the same dose but come back for repeat labs.  However, she did not return for these -She is good on this dose - we discussed about taking the thyroid  hormone every day, with water, >30 minutes before breakfast, separated by >4 hours from acid reflux medications, calcium , iron, multivitamins. Pt. is taking it correctly. -Will check a TSH and free T4 today -I will see her back in 6 months.  2. PCOS -She aged out her PCOS diagnosis -However, she continues to have increased facial hair and alopecia and continue spironolactone  25 mg twice a day, as she feels that this is helping -Latest potassium level was normal: Lab Results  Component Value Date   K 4.0 01/26/2023  -We cannot increase the spironolactone  dose further due to previous hyperkalemia with a higher doses  3. DM2 -She continues on metformin  low-dose and also Jardiance .  She joined the weight management clinic in the past but she had to come off Ozempic  afterwards as she was not able to eat on the recommended food.  States that sugars were at goal, we continued her only on metformin  and Jardiance .  At last visit, she was not checking blood sugars as she was grieving after her mom and had a lot of stress, but plans to restart.  HbA1c before the visit was stable, at 7.0%.  We discussed about the importance of checking blood sugars.  However an HbA1c obtained 1.5 months ago was even higher, at 8.1%.  - I suggested: Patient Instructions  Please continue: - Metformin  ER 500 mg with breakfast - Jardiance  25 mg before breakfast - Ozempic  0.5 mg weekly   Please continue the  crushed levothyroxine  225 g daily.   Take the thyroid  hormone every day, with water, at least 30 minutes before breakfast, separated by at least 4 hours from: - acid reflux medications - calcium  - iron - multivitamins   Continue spironolactone  25 mg twice a day.   Please return in 6 months.  - we checked her HbA1c: 7%  - advised to check sugars at different times of the day - 1x a day, rotating check times - advised  for yearly eye exams >> she is UTD - return to clinic in 6 months  4.  Obesity class III -Continues Jardiance  which should also help with weight loss -She was previously on Ozempic  but came off due to decreased appetite.  She restarted it 03/2023. -In the past, she was doing a partially keto diet with intermittent fasting 16-8 -She gained a significant amount of weight during her COVID infection at the beginning of 2022 -Afterwards, she started hand weights and chair exercises -She then started to see the weight management clinic and lost 12 pounds -She is planning to have abdominal pannus surgery but BMI needs to be lower than 45 -At last visit, weight was stable  Lela Fendt, MD PhD [facility] Endocrinology

## 2023-07-09 ENCOUNTER — Ambulatory Visit (INDEPENDENT_AMBULATORY_CARE_PROVIDER_SITE_OTHER): Payer: Medicare Other | Admitting: Internal Medicine

## 2023-07-09 ENCOUNTER — Encounter: Payer: Self-pay | Admitting: Internal Medicine

## 2023-07-09 VITALS — BP 138/72 | HR 84 | Ht 61.0 in | Wt 286.2 lb

## 2023-07-09 DIAGNOSIS — Z7984 Long term (current) use of oral hypoglycemic drugs: Secondary | ICD-10-CM

## 2023-07-09 DIAGNOSIS — E282 Polycystic ovarian syndrome: Secondary | ICD-10-CM

## 2023-07-09 DIAGNOSIS — Z7985 Long-term (current) use of injectable non-insulin antidiabetic drugs: Secondary | ICD-10-CM | POA: Diagnosis not present

## 2023-07-09 DIAGNOSIS — E039 Hypothyroidism, unspecified: Secondary | ICD-10-CM

## 2023-07-09 DIAGNOSIS — E66813 Obesity, class 3: Secondary | ICD-10-CM | POA: Diagnosis not present

## 2023-07-09 DIAGNOSIS — E1165 Type 2 diabetes mellitus with hyperglycemia: Secondary | ICD-10-CM | POA: Diagnosis not present

## 2023-07-09 LAB — POCT GLYCOSYLATED HEMOGLOBIN (HGB A1C): Hemoglobin A1C: 7.3 % — AB (ref 4.0–5.6)

## 2023-07-09 MED ORDER — TIRZEPATIDE 2.5 MG/0.5ML ~~LOC~~ SOAJ: 2.5000 mg | SUBCUTANEOUS | 1 refills | Status: DC

## 2023-07-09 NOTE — Progress Notes (Addendum)
 Patient ID: Autumn Massey, female   DOB: Sep 11, 1965, 58 y.o.   MRN: [id number] This note was precharted 07/06/2023.  HPI  Autumn Massey is a 58 y.o.-year-old female, returning for f/u for DM2, hypothyroidism, and PCOS. Last visit 4 months ago.  Interim history: She was referred to a plastic surgeon to remove her abdominal pannus.  She needs to have a BMI <45 before surgery.  Also, HbA1c has been lower than 8.  She was offered gastric bypass, but she refused.  She is now going to the weight management clinic. No increased urination, blurry vision, nausea, chest pain. She was started back on Ozempic  04/27/2023 but she came off recently after her appetite actually increased. She relaxed diet over the holidays. Also, she had to have prednisone .  PCOS: Reviewed history:  - weight gain: Maximal weight 364  - was on Phentermine , which worked initially, then stopped working  - Would consider gastric bypass but only as a last resort - + hirsutism >> started to see facial hair in her 30s - + hair loss >> female pattern, and bald spot in vertex - No acne - had a Mirena IUD placed 04/2013 for heavy menstrual cycles >> out now; no menses since IUD out - has frequent Prednisone  tapers    She was previously on spironolactone  50 mg twice daily but her potassium increased so we had to decrease the dose to 25 mg twice daily.  We ended up stopping spironolactone  11/2016.  However, she started to have more facial hair and more frontal balding so we restarted spironolactone  25 mg twice a day.  Her potassium level is normal now: Lab Results  Component Value Date   K 4.0 01/26/2023   Hypothyroidism: Pt. has been dx with hypothyroidism in 2005.  Pt is on levothyroxine  (crushed) 225 mcg daily, taken: - in am - fasting - at least 3h from b'fast - no Ca, Fe - + MVI 2h later! >> moved at night - no PPIs - not on Biotin  Reviewed her TFTs: Lab Results  Component Value Date   TSH 6.17 (H) 02/26/2023    TSH 3.270 03/11/2022   TSH 2.55 07/10/2021   TSH 4.06 03/15/2021   TSH 21.97 (H) 12/06/2020   TSH 13.74 (H) 10/03/2020   TSH 42.400 (H) 09/19/2020   TSH 71.60 (H) 01/27/2020   TSH 0.80 10/20/2019   TSH 0.19 (L) 03/01/2019   FREET4 0.99 02/26/2023   FREET4 1.40 03/11/2022   FREET4 1.02 03/15/2021   FREET4 0.70 12/06/2020   FREET4 0.90 10/03/2020   FREET4 0.68 (L) 09/19/2020   FREET4 1.17 10/20/2019   FREET4 1.23 03/01/2019   FREET4 1.21 07/26/2018   FREET4 0.97 04/30/2018    Pt denies: - feeling nodules in neck - hoarseness - dysphagia - choking  She was on Neurontin  for tremors, but had to decrease the dose because of somnolence and then she was able to stop. She is on primidone  >> restarted by Dr. Skeet.  She was on Lyrica  at night >> switched to Gabapentin .  DM2: Reviewed HbA1c levels: Lab Results  Component Value Date   HGBA1C 8.1 (H) 05/25/2023   HGBA1C 7.0 (H) 01/26/2023   HGBA1C 7.0 (A) 05/02/2022  04/26/2019: HbA1c calculated from fructosamine is 5.4% 03/09/2018: HbA1c calculated from fructosamine is better, at 5.5%! 09/02/2017: HbA1c calculated from Fructosamine is 6.1%. 05/05/2017: HbA1c calculated from Fructosamine is 6.00%. 03/10/2017: HbA1c calculated from Fructosamine is 6.17% (great!). 11/2016: HbA1c calculated from the fructosamine is radically better,  at 5.77%!  She is on: - Metformin  1000 mg with dinner >> 500 mg with dinner >> in am - Jardiance  10 mg in the pm -  started 02/2017 >> initially stopped b/c UTI and also had a tear on her labia >> resolved -on 25 mg daily -  >> off 2/2 decreased appetite >> restarted 04/27/2023: 0.25 >> 0.5 mg weekly >> increased hunger >> stopped Not taking Januvia  >> not covered. She was previously on glipizide .  She was checking sugars 1-2 times a day: - am: 110-132 >> 114-138 >> 120-125 >> 100-110 >> 153, 169 - before b'fast: 100-110 - after b'fast: 153 >> n/c - lunch: n/c >> 98-191 >> n/c - after lunch: 131-152  >> n/c >> 123 >> 141, 170s >> 119-140s - dinner: n/c >> 120-140, 157 >> n/c >> 94-114 >> n/c - after dinner: 173-180 >> 149-173 >> 142-168 >> n/c >> 120-140s Highest CBG: 188 >> 230 (b'day cake + iceceam) >> 140 >> 169.  No CKD: Lab Results  Component Value Date   BUN 24 (H) 01/26/2023   Lab Results  Component Value Date   CREATININE 0.56 01/26/2023   Lab Results  Component Value Date   MICRALBCREAT 5.0 01/26/2023   MICRALBCREAT 4.5 07/10/2021   MICRALBCREAT 31 (H) 01/27/2020   MICRALBCREAT 2.6 01/26/2019   MICRALBCREAT 0.5 11/22/2014   + HL: Lab Results  Component Value Date   CHOL 156 01/26/2023   HDL 38.50 (L) 01/26/2023   LDLCALC 54 03/11/2022   LDLDIRECT 79.0 01/26/2023   TRIG 281.0 (H) 01/26/2023   CHOLHDL 4 01/26/2023  She is on Crestor  40 mg daily.  She had an eye exam - 06/30/2023: No DR reportedly.  She had a foot exam 07/18/2022.  She quit smoking 05/2015.  She had a D&C on 04/28/2019.  ROS: + See HPI + Tremors   I reviewed pt's medications, allergies, PMH, social hx, family hx, and changes were documented in the history of present illness. Otherwise, unchanged from my initial visit note.  She does not have a family history of medullary thyroid  cancer or personal history of pancreatitis.  Past Medical History:  Diagnosis Date   Anxiety    ANXIETY DEPRESSION 01/29/2009   04/26/2019- no current   Arthritis    Asthma    CHF (congestive heart failure) (HCC)    DEPRESSION 11/19/2007   Diabetes (HCC)    Dyspnea    FREQUENCY, URINARY 10/17/2009   HOH (hard of hearing)    HYPERLIPIDEMIA 11/19/2007   HYPERTENSION 11/19/2007   HYPOTHYROIDISM 11/19/2007   Morbid obesity (HCC)    Neuropathy 01/26/2019   Neuropathy    PNEUMONIA 08/06/2010   Pneumonia    POLYCYTHEMIA 01/29/2009   Preventative health care 04/06/2011   Sleep apnea    mild sleep apnea, on o2 at 2l at nighttime   SLEEP RELATED HYPOVENTILATION/HYPOXEMIA CCE 02/08/2009   Venous  insufficiency 07/19/2012   Past Surgical History:  Procedure Laterality Date   ANTERIOR CERVICAL CORPECTOMY N/A 06/19/2021   Procedure: Cerivcal Six Corpectomy;  Surgeon: Gillie Duncans, MD;  [facility] OR;  Service: Neurosurgery;  Laterality: N/A;   DILITATION & CURRETTAGE/HYSTROSCOPY WITH NOVASURE ABLATION N/A 04/28/2019   Procedure: DILATATION & CURETTAGE/HYSTEROSCOPY WITH NOVASURE ABLATION;  Surgeon: Johnnye Ade, MD;  Location: [facility] OR;  Service: Gynecology;  Laterality: N/A;  [employer] rep will be here confirmed on 04/20/19   TONSILLECTOMY     ULNAR TUNNEL RELEASE Left 02/22/2013   Procedure: LEFT ULNAR NERVE DECOMPRESSION ;  Surgeon: Franky JONELLE Curia, MD;  Location: [facility];  Service: Orthopedics;  Laterality: Left;   Social History   Socioeconomic History   Marital status: Divorced    Spouse name: Not on file   Number of children: 0   Years of education: 12   Highest education level: 12th grade  Occupational History   Occupation: Disabled    Employer: [employer]  Tobacco Use   Smoking status: Every Day    Current packs/day: 0.50    Average packs/day: 0.5 packs/day for 22.0 years (11.0 ttl pk-yrs)    Types: Cigarettes   Smokeless tobacco: Never  Vaping Use   Vaping status: Never Used  Substance and Sexual Activity   Alcohol use: Not Currently    Comment: rare   Drug use: No   Sexual activity: Never  Other Topics Concern   Not on file  Social History Narrative   Patient is right-handed.   Lives alone   Social Drivers of Health   Financial Resource Strain: Medium Risk (06/29/2023)   Overall Financial Resource Strain (CARDIA)    Difficulty of Paying Living Expenses: Somewhat hard  Food Insecurity: No Food Insecurity (06/29/2023)   Hunger Vital Sign    Worried About Running Out of Food in the Last Year: Never true    Ran Out of Food in the Last Year: Never true  Transportation Needs: No Transportation Needs (06/29/2023)   PRAPARE -  Administrator, Civil Service (Medical): No    Lack of Transportation (Non-Medical): No  Physical Activity: Insufficiently Active (06/29/2023)   Exercise Vital Sign    Days of Exercise per Week: 3 days    Minutes of Exercise per Session: 20 min  Stress: No Stress Concern Present (06/29/2023)   [employer] of Occupational Health - Occupational Stress Questionnaire    Feeling of Stress : Only a little  Social Connections: Moderately Isolated (06/29/2023)   Social Connection and Isolation Panel [NHANES]    Frequency of Communication with Friends and Family: More than three times a week    Frequency of Social Gatherings with Friends and Family: Once a week    Attends Religious Services: 1 to 4 times per year    Active Member of [employer] or Organizations: No    Attends Banker Meetings: Never    Marital Status: Divorced  Catering Manager Violence: Patient Unable To Answer (06/29/2023)   Humiliation, Afraid, Rape, and Kick questionnaire    Fear of Current or Ex-Partner: Patient unable to answer    Emotionally Abused: Patient unable to answer    Physically Abused: Patient unable to answer    Sexually Abused: Patient unable to answer   Current Outpatient Medications on File Prior to Visit  Medication Sig Dispense Refill   Accu-Chek Softclix Lancets lancets USE AS INSTRUCTED TO CHECK BLOOD SUGAR ONCE DAILY 100 each 2   albuterol  (PROVENTIL ) (2.5 MG/3ML) 0.083% nebulizer solution Take 3 mLs (2.5 mg total) by nebulization every 6 (six) hours as needed for wheezing or shortness of breath. 75 mL 3   albuterol  (VENTOLIN  HFA) 108 (90 Base) MCG/ACT inhaler USE 2 INHALATIONS BY MOUTH EVERY 6 HOURS AS NEEDED 34 g 2   aspirin  EC 81 MG tablet Take 81 mg by mouth daily.     Azelastine -Fluticasone  137-50 MCG/ACT SUSP Place 1 spray into the nose every 12 (twelve) hours. (Patient not taking: Reported on 06/29/2023) 23 g 0   bisoprolol -hydrochlorothiazide  (ZIAC ) 5-6.25 MG tablet  TAKE 1 TABLET  BY MOUTH DAILY 100 tablet 2   Blood Glucose Monitoring Suppl (ACCU-CHEK GUIDE) w/Device KIT Use as instructed to check blood sugar 2X daily 1 kit 0   BREO ELLIPTA  100-25 MCG/ACT AEPB USE 1 INHALATION BY MOUTH ONCE  DAILY AT THE SAME TIME EACH DAY 180 each 3   cholecalciferol  (VITAMIN D3) 25 MCG (1000 UNIT) tablet Take 1,000 Units by mouth daily.     clonazePAM  (KLONOPIN ) 0.5 MG tablet Take 1 tablet (0.5 mg total) by mouth 2 (two) times daily as needed for anxiety. 60 tablet 5   cyclobenzaprine  (FLEXERIL ) 5 MG tablet TAKE 1 TABLET BY MOUTH 3  TIMES DAILY AS NEEDED FOR  MUSCLE SPASM(S) (Patient not taking: Reported on 06/29/2023) 270 tablet 1   Diclofenac  Sodium (PENNSAID ) 2 % SOLN Place 2 g onto the skin 2 (two) times daily. 112 g 3   FLUoxetine  (PROZAC ) 40 MG capsule TAKE 1 CAPSULE BY MOUTH DAILY 100 capsule 3   furosemide  (LASIX ) 40 MG tablet TAKE 1 TABLET BY MOUTH DAILY 90 tablet 1   Gabapentin  Enacarbil (HORIZANT ) 600 MG TBCR Take 1 tablet (600 mg total) by mouth at bedtime. 90 tablet 3   glucose blood (ACCU-CHEK GUIDE) test strip Use as instructed to check blood sugar 2X daily 200 each 3   irbesartan  (AVAPRO ) 75 MG tablet TAKE 1 TABLET BY MOUTH AT  BEDTIME 100 tablet 2   JARDIANCE  25 MG TABS tablet TAKE 1 TABLET BY MOUTH DAILY  BEFORE BREAKFAST 100 tablet 2   Lancets Misc. (ACCU-CHEK FASTCLIX LANCET) KIT Use to check blood sugar once a day 1 kit 0   levothyroxine  (SYNTHROID ) 200 MCG tablet TAKE 1 TABLET BY MOUTH DAILY 100 tablet 2   levothyroxine  (SYNTHROID ) 25 MCG tablet TAKE 1 TABLET BY MOUTH DAILY  BEFORE BREAKFAST WITH 200 MCG  TABLET FOR A TOTAL DAILY DOSE OF 225 MCG 100 tablet 2   metFORMIN  (GLUCOPHAGE -XR) 500 MG 24 hr tablet TAKE 1 TABLET BY MOUTH DAILY  WITH BREAKFAST 100 tablet 2   montelukast  (SINGULAIR ) 10 MG tablet TAKE 1 TABLET BY MOUTH AT  BEDTIME 100 tablet 3   Multiple Vitamin (MULTIVITAMIN WITH MINERALS) TABS tablet Take 1 tablet by mouth daily.     ondansetron   (ZOFRAN  ODT) 4 MG disintegrating tablet Take 1 tablet (4 mg total) by mouth every 8 (eight) hours as needed for nausea or vomiting. 4 tablet 0   oxyCODONE  (OXY IR/ROXICODONE ) 5 MG immediate release tablet Take 5 mg by mouth every 6 (six) hours as needed. (Patient not taking: Reported on 06/29/2023)     OXYGEN  2lpm with sleep only  [facility]     primidone  (MYSOLINE ) 50 MG tablet TAKE 1 TABLET BY MOUTH IN THE  MORNING AND 3 TABLETS BY MOUTH  AT BEDTIME 360 tablet 3   rosuvastatin  (CRESTOR ) 40 MG tablet TAKE 1 TABLET BY MOUTH DAILY 100 tablet 2   Semaglutide ,0.25 or 0.5MG /DOS, (OZEMPIC , 0.25 OR 0.5 MG/DOSE,) 2 MG/3ML SOPN 0.25 once wkly * 2 wks, then inc to 0.5 wkly     spironolactone  (ALDACTONE ) 25 MG tablet TAKE 1 TABLET BY MOUTH TWICE  DAILY 200 tablet 2   No current facility-administered medications on file prior to visit.   Allergies  Allergen Reactions   Cymbalta  [Duloxetine  Hcl]     Altered mental state / agitated    Family History  Problem Relation Age of Onset   Cancer Mother        melanoma    Heart disease Mother  High blood pressure Mother    Stroke Mother    Thyroid  disease Mother    Anxiety disorder Mother    High Cholesterol Sister    Psoriasis Brother    Osteoarthritis Maternal Grandmother 17   Scoliosis Maternal Grandmother    Cancer Maternal Grandfather        prostate cancer   Alcohol abuse Other    Arthritis Other    Hypertension Other    PE: BP 138/72   Pulse 84   Ht 5' 1 (1.549 m)   Wt 286 lb 3.2 oz (129.8 kg)   SpO2 92%   BMI 54.08 kg/m  Wt Readings from Last 10 Encounters:  07/09/23 286 lb 3.2 oz (129.8 kg)  06/29/23 280 lb (127 kg)  04/27/23 280 lb (127 kg)  03/23/23 281 lb (127.5 kg)  02/26/23 284 lb 9.6 oz (129.1 kg)  01/26/23 280 lb (127 kg)  11/04/22 280 lb (127 kg)  10/14/22 274 lb (124.3 kg)  10/06/22 282 lb (127.9 kg)  09/08/22 271 lb (122.9 kg)   Constitutional: overweight, in NAD Eyes: no exophthalmos ENT: no thyromegaly, no  cervical lymphadenopathy Cardiovascular: RRR, No MRG Respiratory: CTA B Musculoskeletal: no deformities Skin:  + stasis dermatitis rashes on lower legs R>L Neurological: no tremor with outstretched hands  PLAN:  1. Patient with longstanding hypothyroidism -Previously uncontrolled -Last year, she had very high TSH levels, 71 and 42 respectively, despite a high dose of levothyroxine .  I was not aware of these results, but I became aware of her TSH from 09/2020 which improved to 13.7.  Afterwards, we discussed about the need for compliance and also advised her to crush the levothyroxine  tablet, after which her TFTs normalized, but at last visit, the level was again elevated: - latest thyroid  labs reviewed with pt. >> normal: Lab Results  Component Value Date   TSH 6.17 (H) 02/26/2023  - she continues on LT4 225 mcg daily (crushed) - After the above results returned, I advised her to continue the same dose but come back for repeat labs.  However, she did not return for these - pt feels good on this dose. - we discussed about taking the thyroid  hormone every day, with water, >30 minutes before breakfast, separated by >4 hours from acid reflux medications, calcium , iron, multivitamins. Pt. is taking it correctly. - will check thyroid  tests today: TSH and fT4 - If labs are abnormal, she will need to return for repeat TFTs in 1.5 months  2. PCOS -She aged out her PCOS diagnosis -However, she continues to have increased facial hair and alopecia and continue spironolactone  25 mg twice a day, as she feels that this is helping -Latest potassium level was normal: Lab Results  Component Value Date   K 4.0 01/26/2023  -We cannot increase spironolactone  dose further due to previous hyperkalemia at the higher doses  3. DM2 -She continues on metformin  low-dose and also Jardiance .  She joined the weight management clinic in the past but she had to come off Ozempic  afterwards as she was not able to eat  on the recommended food.  States that sugars were at goal, we continued her only on metformin  and Jardiance .  At last visit, she was not checking blood sugars as she was grieving after her mom and had a lot of stress, but plans to restart.  HbA1c before the visit was stable, at 7.0%.  We discussed about the importance of checking blood sugars.  However, an HbA1c obtained 1.5 months  ago was even higher, at 8.1%. -At today's visit, sugars are slightly higher than before, higher right when she wakes up but improving before breakfast.  Overall, she describes that she had to have steroids lately and also relaxed her diet quite a bit over the holidays.  She was started on Ozempic  by Dr. Midge and she did well on the low-dose, 0.25 mg, but weight plateaued.  She increased the dose to 0.5 mg weekly but around that time she also had her total daily calories decreased and she felt very hungry.  As a consequence, she stopped Ozempic .  At today's visit we discussed about possibly trying Mounjaro  (upon her questioning) a low dose and increase as tolerated.  If she cannot use this for any reason, she was advised to go back to Ozempic  and start at 0.25 mg weekly and then increase to 0.5 mg weekly after 4 doses.  Will continue the rest of the regimen for now. - I suggested: Patient Instructions  Please continue: - Metformin  ER 500 mg with breakfast - Jardiance  25 mg before breakfast  Switch from Ozempic  to  - Mounjaro  2.5 mg weekly. If you cannot use this, then restart: - Ozempic  0.25 mg weekly >> increase to 0.5 mg weekly after 4 weeks.   Please continue the crushed levothyroxine  225 g daily.   Take the thyroid  hormone every day, with water, at least 30 minutes before breakfast, separated by at least 4 hours from: - acid reflux medications - calcium  - iron - multivitamins   Continue spironolactone  25 mg twice a day.   Please return in 4 months.  - we checked her HbA1c: 7.3% (lower) - advised to check  sugars at different times of the day - 1x a day, rotating check times - advised for yearly eye exams >> she is UTD - return to clinic in 4 months  4.  Obesity class III -Continues Jardiance  but came off Ozempic  before last visit due to decreased appetite.  She restarted Ozempic  briefly since last visit.  She is now off again. -In the past, she was doing a partially keto diet with intermittent fasting 16-8 -She gained a significant amount of weight during her COVID infection at the beginning of 2022 -Afterwards, she started hand weights and chair exercises -She then started to see the weight management clinic and lost 12 pounds -She is planning to have abdominal pannus surgery but BMI needs to be lower than 45 - At last visit weight was stable, and gained 2 pounds net since last visit -She is now seeing the weight management clinic -At today's visit we will to start Mounjaro , as mentioned above but may use Ozempic  if this is not covered for her.  Component     Latest Ref Rng 07/09/2023  TSH     0.40 - 4.50 mIU/L 1.96   T4,Free(Direct)     0.8 - 1.8 ng/dL 1.4   Hemoglobin J8R     4.0 - 5.6 % 7.3 !   Normal TFTs.  Lela Fendt, MD PhD [facility]

## 2023-07-09 NOTE — Patient Instructions (Addendum)
 Please continue: - Metformin  ER 500 mg with breakfast - Jardiance  25 mg before breakfast  Switch from Ozempic  to  - Mounjaro  2.5 mg weekly. If you cannot use this, then restart: - Ozempic  0.25 mg weekly >> increase to 0.5 mg weekly after 4 weeks.   Please continue the crushed levothyroxine  225 g daily.   Take the thyroid  hormone every day, with water, at least 30 minutes before breakfast, separated by at least 4 hours from: - acid reflux medications - calcium  - iron - multivitamins   Continue spironolactone  25 mg twice a day.   Please return in 4 months.

## 2023-07-10 LAB — T4, FREE: Free T4: 1.4 ng/dL (ref 0.8–1.8)

## 2023-07-10 LAB — TSH: TSH: 1.96 m[IU]/L (ref 0.40–4.50)

## 2023-07-20 ENCOUNTER — Telehealth: Payer: Self-pay

## 2023-07-20 NOTE — Telephone Encounter (Signed)
Autumn Massey,  During chart prep for the upcoming colonoscopy, I discovered this patient's BMI is over 50 (54.1 BMI on 07/09/23).  Per [facility] guidelines, she will need to be done at the hospital.  Will you please schedule this.  Currently, she is scheduled w/ Dr. Tomasa Rand in the [facility] on 08/11/23 @1100 .  Thank you

## 2023-07-20 NOTE — Telephone Encounter (Signed)
Left VM for patient to return call regarding changing her procedure to [place] on 08/10/23 due to BMI being over 50

## 2023-07-20 NOTE — Telephone Encounter (Signed)
Patient returned call. I let her know that her Colonoscopy would have to be at [facility]. Patient stated that she needs a Tuesday and would like to be put on a list . Patient placed on the hospital list.

## 2023-07-22 ENCOUNTER — Telehealth: Payer: Self-pay

## 2023-07-22 NOTE — Telephone Encounter (Signed)
Left VM for patient to return call regarding an opening at the hospital 07/28/23

## 2023-07-22 NOTE — Telephone Encounter (Signed)
Spoke with patient and she stated that she would like to wait for a Tuesday in May. I let patient know that it could be a while before a Tuesday would become available.

## 2023-07-27 ENCOUNTER — Ambulatory Visit (INDEPENDENT_AMBULATORY_CARE_PROVIDER_SITE_OTHER): Payer: Medicare Other | Admitting: Family Medicine

## 2023-07-28 ENCOUNTER — Ambulatory Visit (INDEPENDENT_AMBULATORY_CARE_PROVIDER_SITE_OTHER): Payer: Medicare Other | Admitting: Family Medicine

## 2023-07-28 ENCOUNTER — Encounter: Payer: Self-pay | Admitting: Neurology

## 2023-07-28 ENCOUNTER — Encounter (INDEPENDENT_AMBULATORY_CARE_PROVIDER_SITE_OTHER): Payer: Self-pay | Admitting: Family Medicine

## 2023-07-28 VITALS — BP 111/70 | HR 77 | Temp 98.2°F | Ht 61.0 in | Wt 282.0 lb

## 2023-07-28 DIAGNOSIS — Z7984 Long term (current) use of oral hypoglycemic drugs: Secondary | ICD-10-CM

## 2023-07-28 DIAGNOSIS — E1159 Type 2 diabetes mellitus with other circulatory complications: Secondary | ICD-10-CM | POA: Diagnosis not present

## 2023-07-28 DIAGNOSIS — E1169 Type 2 diabetes mellitus with other specified complication: Secondary | ICD-10-CM | POA: Diagnosis not present

## 2023-07-28 DIAGNOSIS — E559 Vitamin D deficiency, unspecified: Secondary | ICD-10-CM | POA: Diagnosis not present

## 2023-07-28 DIAGNOSIS — Z7985 Long-term (current) use of injectable non-insulin antidiabetic drugs: Secondary | ICD-10-CM | POA: Diagnosis not present

## 2023-07-28 DIAGNOSIS — I152 Hypertension secondary to endocrine disorders: Secondary | ICD-10-CM

## 2023-07-28 DIAGNOSIS — Z6841 Body Mass Index (BMI) 40.0 and over, adult: Secondary | ICD-10-CM

## 2023-07-28 NOTE — Progress Notes (Signed)
Autumn Massey, D.O.  ABFM, ABOM Specializing in Clinical Bariatric Medicine  Office located at: [address] [place], [place]  [address]   Assessment and Plan:   FOR THE DISEASE OF OBESITY: BMI 50.0-59.9, adult (HCC)-current BMI 53.31 Morbid obesity (HCC): Starting BMI 54.4 Assessment & Plan: Patient's muscle mass is 117.4 lbs. Fat mass is 158.6 lbs. Total body water is 94.6 lbs. Counseling done on how various foods will affect these numbers and how to maximize success  Total lbs lost to date: 6 lbs  Total weight loss percentage to date: 2.08%    Recommended Dietary Goals Autumn Massey is currently in the action stage of change. As such, her goal is to continue weight management plan.  She has agreed to: switch to Category 3 MP with 100 snack calories  and B/L options. We made this switch because pt has not been journaling.    Behavioral Intervention We discussed the following today: continue to work on maintaining a reduced calorie state, getting the recommended amount of protein, incorporating whole foods, making healthy choices, staying well hydrated and practicing mindfulness when eating.  Additional resources provided today: handout on CAT 3 meal plan, Handout on CAT 3-4 breakfast options, and Handout on CAT 3-4 lunch options  Evidence-based interventions for health behavior change were utilized today including the discussion of self monitoring techniques, problem-solving barriers and SMART goal setting techniques.   Regarding patient's less desirable eating habits and patterns, we employed the technique of small changes.   Pt will specifically work on: n/a   Recommended Physical Activity Goals Autumn Massey has been advised to work up to 150 minutes of moderate intensity aerobic activity a week and strengthening exercises 2-3 times per week for cardiovascular health, weight loss maintenance and preservation of muscle mass.   She has agreed to : Continue current level of  physical activity    FOR ASSOCIATED CONDITIONS ADDRESSED TODAY:  Type 2 diabetes mellitus with obesity (HCC) Assessment & Plan: Condition managed by Dr.Gerghe. Autumn Massey's Hemoglobin A1c has improved from 8.1 to 7.3. Pt states that her hunger/ cravings increased when she took the Ozempic 0.5 mg. She has been taking Ozempic 0.25 mg and feels that her hunger/cravings are better controlled.   Pt states that she'll be switched to [facility] in the near future. Continue Metformin and Jardiance. Reminded pt that having adequate amounts of protein with each meal is important for increasing muscle mass, stabilizing sugars, controlling hunger and cravings, and improving thermogenesis. Continue with reduced calorie meal plan low on processed crabs and simple sugars.    Hypertension associated with type 2 diabetes mellitus (HCC) Assessment & Plan: Last 3 blood pressure readings in our office are as follows: BP Readings from Last 3 Encounters:  07/28/23 111/70  07/09/23 138/72  04/27/23 101/68   Pt currently on Spironolactone, Irbesartan, and Lasix. Today her blood pressure is stable. Pt states that her blood pressure was low (e.g 101/68 and 98/60) when she visited her wound care specialist. She was asymptomatic during these episodes. Continue medication regimen. Discuss episodes of low blood pressure with Dr.John at OV tomorrow.    Vitamin D deficiency Assessment & Plan: Condition stable. Labs were recently checked by Dr.Gerghe and pt was told to continue OTC cholecalciferol 1,000 units daily. Will continue to monitor alongside endocrinologist.    Follow up:   Return 09/01/2023. She was informed of the importance of frequent follow up visits to maximize her success with intensive lifestyle modifications for her multiple health conditions.  Subjective:   Chief complaint: Obesity Autumn Massey is here to discuss her progress with her obesity treatment plan. She is keeping a food journal and adhering to  recommended goals of 1400-1500 calories and 110+ protein and states she is following her eating plan approximately 40% of the time. She states she is doing hand weights and leg lifts 15 minutes 2-3 days a week.   Interval History:  Autumn Massey is here for a follow up office visit. Since last OV on 04/27/23, Autumn Massey is up 2 lbs. She endorses deviating from her meal plan over the holiday period. She has been eating relatively healthy since the 1st week of January although she admits that she could do better with her protein intake. She has not been journaling her intake.   Pharmacotherapy for weight loss: She is currently taking Ozempic 0.25 mg once a week and Metformin XR 500 mg once daily.   Review of Systems:  Pertinent positives were addressed with patient today.  Reviewed by clinician on day of visit: allergies, medications, problem list, medical history, surgical history, family history, social history, and previous encounter notes.  Weight Summary and Biometrics   Weight Lost Since Last Visit: 0  Weight Gained Since Last Visit: 1 lb    Vitals Temp: 98.2 F (36.8 C) BP: 111/70 Pulse Rate: 77 SpO2: 92 %   Anthropometric Measurements Height: 5\' 1"  (1.549 m) Weight: 282 lb (127.9 kg) BMI (Calculated): 53.31 Weight at Last Visit: 281 lb Weight Lost Since Last Visit: 0 Weight Gained Since Last Visit: 1 lb Starting Weight: 288 lb Total Weight Loss (lbs): 6 lb (2.722 kg)   Body Composition  Body Fat %: 56.2 % Fat Mass (lbs): 158.6 lbs Muscle Mass (lbs): 117.4 lbs Total Body Water (lbs): 94.6 lbs Visceral Fat Rating : 23   Other Clinical Data Fasting: No Today's Visit #: 20   Objective:   PHYSICAL EXAM: Blood pressure 111/70, pulse 77, temperature 98.2 F (36.8 C), height 5\' 1"  (1.549 m), weight 282 lb (127.9 kg), SpO2 92%. Body mass index is 53.28 kg/m.  General: she is overweight, cooperative and in no acute distress. PSYCH: Has normal mood, affect and  thought process.   HEENT: EOMI, sclerae are anicteric. Lungs: Normal breathing effort, no conversational dyspnea. Extremities: Moves * 4 Neurologic: A and O * 3, good insight  DIAGNOSTIC DATA REVIEWED: BMET    Component Value Date/Time   NA 135 01/26/2023 1503   NA 137 03/11/2022 1554   K 4.0 01/26/2023 1503   CL 97 01/26/2023 1503   CO2 28 01/26/2023 1503   GLUCOSE 100 (H) 01/26/2023 1503   BUN 24 (H) 01/26/2023 1503   BUN 11 03/11/2022 1554   CREATININE 0.56 01/26/2023 1503   CREATININE 0.73 03/02/2018 1157   CALCIUM 10.3 01/26/2023 1503   GFRNONAA >60 09/11/2022 0815   GFRNONAA 95 03/02/2018 1157   GFRAA >60 04/26/2019 1445   GFRAA 110 03/02/2018 1157   Lab Results  Component Value Date   HGBA1C 7.3 (A) 07/09/2023   HGBA1C 5.6 03/15/2012   Lab Results  Component Value Date   INSULIN 7.5 03/11/2022   Lab Results  Component Value Date   TSH 1.96 07/09/2023   CBC    Component Value Date/Time   WBC 9.6 01/26/2023 1503   RBC 5.52 (H) 01/26/2023 1503   HGB 17.2 (H) 01/26/2023 1503   HGB 19.6 (H) 03/11/2022 1554   HCT 52.7 (H) 01/26/2023 1503   HCT 57.0 (H) 03/11/2022 1554  PLT 167.0 01/26/2023 1503   PLT 167 03/11/2022 1554   MCV 95.3 01/26/2023 1503   MCV 92 03/11/2022 1554   MCH 30.3 09/11/2022 0815   MCHC 32.7 01/26/2023 1503   RDW 13.9 01/26/2023 1503   RDW 12.1 03/11/2022 1554   Iron Studies    Component Value Date/Time   IRON 93 01/26/2019 0959   FERRITIN 124.5 01/29/2009 0905   IRONPCTSAT 31.3 01/26/2019 0959   Lipid Panel     Component Value Date/Time   CHOL 156 01/26/2023 1503   CHOL 130 03/11/2022 1554   TRIG 281.0 (H) 01/26/2023 1503   HDL 38.50 (L) 01/26/2023 1503   HDL 34 (L) 03/11/2022 1554   CHOLHDL 4 01/26/2023 1503   VLDL 56.2 (H) 01/26/2023 1503   LDLCALC 54 03/11/2022 1554   LDLCALC 109 (H) 01/27/2020 1006   LDLDIRECT 79.0 01/26/2023 1503   Hepatic Function Panel     Component Value Date/Time   PROT 7.5 01/26/2023 1503    PROT 6.9 03/11/2022 1554   ALBUMIN 4.1 01/26/2023 1503   ALBUMIN 4.3 03/11/2022 1554   AST 19 01/26/2023 1503   ALT 23 01/26/2023 1503   ALKPHOS 64 01/26/2023 1503   BILITOT 0.7 01/26/2023 1503   BILITOT 0.7 03/11/2022 1554   BILIDIR 0.1 01/26/2023 1503   IBILI 0.4 01/27/2020 1006      Component Value Date/Time   TSH 1.96 07/09/2023 1553   Nutritional Lab Results  Component Value Date   VD25OH 47.22 01/26/2023   VD25OH 83.22 09/11/2022   VD25OH 39.5 03/11/2022    Attestations:   I, Autumn Massey, acting as a Stage manager for [employer], DO., have compiled all relevant documentation for today's office visit on behalf of Autumn Lot, DO, while in the presence of [employer], DO.  I have reviewed the above documentation for accuracy and completeness, and I agree with the above. Autumn Massey, D.O.  The 21st Century Cures Act was signed into law in 2016 which includes the topic of electronic health records.  This provides immediate access to information in MyChart.  This includes consultation notes, operative notes, office notes, lab results and pathology reports.  If you have any questions about what you read please let us know at your next visit so we can discuss your concerns and take corrective action if need be.  We are right here with you.

## 2023-07-29 ENCOUNTER — Ambulatory Visit: Payer: Medicare Other | Admitting: Internal Medicine

## 2023-07-29 VITALS — BP 110/70 | HR 81 | Ht 61.0 in | Wt 282.0 lb

## 2023-07-29 DIAGNOSIS — E538 Deficiency of other specified B group vitamins: Secondary | ICD-10-CM | POA: Diagnosis not present

## 2023-07-29 DIAGNOSIS — Z7984 Long term (current) use of oral hypoglycemic drugs: Secondary | ICD-10-CM | POA: Diagnosis not present

## 2023-07-29 DIAGNOSIS — E559 Vitamin D deficiency, unspecified: Secondary | ICD-10-CM

## 2023-07-29 DIAGNOSIS — E1159 Type 2 diabetes mellitus with other circulatory complications: Secondary | ICD-10-CM

## 2023-07-29 DIAGNOSIS — I152 Hypertension secondary to endocrine disorders: Secondary | ICD-10-CM

## 2023-07-29 DIAGNOSIS — Z7985 Long-term (current) use of injectable non-insulin antidiabetic drugs: Secondary | ICD-10-CM | POA: Diagnosis not present

## 2023-07-29 DIAGNOSIS — F17200 Nicotine dependence, unspecified, uncomplicated: Secondary | ICD-10-CM

## 2023-07-29 DIAGNOSIS — Z0001 Encounter for general adult medical examination with abnormal findings: Secondary | ICD-10-CM | POA: Diagnosis not present

## 2023-07-29 DIAGNOSIS — Z23 Encounter for immunization: Secondary | ICD-10-CM | POA: Diagnosis not present

## 2023-07-29 DIAGNOSIS — E1165 Type 2 diabetes mellitus with hyperglycemia: Secondary | ICD-10-CM

## 2023-07-29 DIAGNOSIS — E7849 Other hyperlipidemia: Secondary | ICD-10-CM | POA: Diagnosis not present

## 2023-07-29 DIAGNOSIS — E039 Hypothyroidism, unspecified: Secondary | ICD-10-CM

## 2023-07-29 NOTE — Patient Instructions (Signed)
You had the flu shot today  Please continue all other medications as before, and refills have been done if requested.  Please have the pharmacy call with any other refills you may need.  Please continue your efforts at being more active, low cholesterol diet, and weight control.  You are otherwise up to date with prevention measures today.  Please keep your appointments with your specialists as you may have planned  Please make an Appointment to return in 6 months, or sooner if needed

## 2023-07-29 NOTE — Progress Notes (Addendum)
Patient ID: MALEYA Massey, female   DOB: [birth year], 58 y.o.   MRN: [id number]         Chief Complaint:: wellness exam and Medical Management of Chronic Issues (Has been losing weight, blood pressures have been running low. Between 90-110's over the 60's)  , dm, hld, low thyroid, low b12 and vit D, smoker       HPI:  Autumn Massey is a 58 y.o. female here for wellness exam; pt plans to f/u with GYN soon herself, has colonoscopy scheduled soon, declines covid booster, declines eye exam referral, for shingrix at pharmacy, o/w up to date. Still smoking, not ready to quit;  Also for flu shot today                        Also Pt denies chest pain, increased sob or doe, wheezing, orthopnea, PND, increased LE swelling, palpitations, dizziness or syncope.   Pt denies polydipsia, polyuria, or new focal neuro s/s.    Pt denies fever, wt loss, night sweats, loss of appetite, or other constitutional symptoms  BP has been low normal recently. Tolerating crestor 40 mg well.  Denies hyper or hypo thyroid symptoms such as voice, skin or hair change.   Wt Readings from Last 3 Encounters:  07/29/23 282 lb (127.9 kg)  07/28/23 282 lb (127.9 kg)  07/09/23 286 lb 3.2 oz (129.8 kg)   BP Readings from Last 3 Encounters:  07/29/23 110/70  07/28/23 111/70  07/09/23 138/72   Immunization History  Administered Date(s) Administered   Fluad Trivalent(High Dose 65+) 07/29/2023   Influenza Split 04/11/2011, 03/15/2012, 03/31/2015   Influenza Whole 02/20/2010   Influenza,inj,Quad PF,6+ Mos 08/11/2013, 03/07/2014, 03/10/2017, 03/02/2018, 04/05/2021, 05/02/2022   Pneumococcal Conjugate-13 11/22/2014   Pneumococcal Polysaccharide-23 11/04/2016   Td 01/29/2009   Tdap 01/26/2019   Health Maintenance Due  Topic Date Due   Cervical Cancer Screening (HPV/Pap Cotest)  02/29/2012   Colonoscopy  04/30/2020      Past Medical History:  Diagnosis Date   Anxiety    ANXIETY DEPRESSION 01/29/2009   04/26/2019- no  current   Arthritis    Asthma    CHF (congestive heart failure) (HCC)    DEPRESSION 11/19/2007   Diabetes (HCC)    Dyspnea    FREQUENCY, URINARY 10/17/2009   HOH (hard of hearing)    HYPERLIPIDEMIA 11/19/2007   HYPERTENSION 11/19/2007   HYPOTHYROIDISM 11/19/2007   Morbid obesity (HCC)    Neuropathy 01/26/2019   Neuropathy    PNEUMONIA 08/06/2010   Pneumonia    POLYCYTHEMIA 01/29/2009   Preventative health care 04/06/2011   Sleep apnea    mild sleep apnea, on o2 at 2l at nighttime   SLEEP RELATED HYPOVENTILATION/HYPOXEMIA CCE 02/08/2009   Venous insufficiency 07/19/2012   Past Surgical History:  Procedure Laterality Date   ANTERIOR CERVICAL CORPECTOMY N/A 06/19/2021   Procedure: Cerivcal Six Corpectomy;  Surgeon: Coletta Memos, MD;  Location[facility] OR;  Service: Neurosurgery;  Laterality: N/A;   DILITATION & CURRETTAGE/HYSTROSCOPY WITH NOVASURE ABLATION N/A 04/28/2019   Procedure: DILATATION & CURETTAGE/HYSTEROSCOPY WITH NOVASURE ABLATION;  Surgeon: Richarda Overlie, MD;  Location: [facility] OR;  Service: Gynecology;  Laterality: N/A;  Novasure rep will be here confirmed on 04/20/19   TONSILLECTOMY     ULNAR TUNNEL RELEASE Left 02/22/2013   Procedure: LEFT ULNAR NERVE DECOMPRESSION ;  Surgeon: Tami Ribas, MD;  Location:  SURGERY CENTER;  Service: Orthopedics;  Laterality: Left;  reports that she has been smoking cigarettes. She has a 11 pack-year smoking history. She has never used smokeless tobacco. She reports that she does not currently use alcohol. She reports that she does not use drugs. family history includes Alcohol abuse in an other family member; Anxiety disorder in her mother; Arthritis in an other family member; Cancer in her maternal grandfather and mother; Heart disease in her mother; High Cholesterol in her sister; High blood pressure in her mother; Hypertension in an other family member; Osteoarthritis (age of onset: 103) in her maternal grandmother; Psoriasis in  her brother; Scoliosis in her maternal grandmother; Stroke in her mother; Thyroid disease in her mother. Allergies  Allergen Reactions   Cymbalta [Duloxetine Hcl]     Altered mental state / agitated    Current Outpatient Medications on File Prior to Visit  Medication Sig Dispense Refill   Accu-Chek Softclix Lancets lancets USE AS INSTRUCTED TO CHECK BLOOD SUGAR ONCE DAILY 100 each 2   albuterol (PROVENTIL) (2.5 MG/3ML) 0.083% nebulizer solution Take 3 mLs (2.5 mg total) by nebulization every 6 (six) hours as needed for wheezing or shortness of breath. 75 mL 3   albuterol (VENTOLIN HFA) 108 (90 Base) MCG/ACT inhaler USE 2 INHALATIONS BY MOUTH EVERY 6 HOURS AS NEEDED 34 g 2   aspirin EC 81 MG tablet Take 81 mg by mouth daily.     Azelastine-Fluticasone 137-50 MCG/ACT SUSP Place 1 spray into the nose every 12 (twelve) hours. 23 g 0   bisoprolol-hydrochlorothiazide (ZIAC) 5-6.25 MG tablet TAKE 1 TABLET BY MOUTH DAILY 100 tablet 2   Blood Glucose Monitoring Suppl (ACCU-CHEK GUIDE) w/Device KIT Use as instructed to check blood sugar 2X daily 1 kit 0   BREO ELLIPTA 100-25 MCG/ACT AEPB USE 1 INHALATION BY MOUTH ONCE  DAILY AT THE SAME TIME EACH DAY 180 each 3   cholecalciferol (VITAMIN D3) 25 MCG (1000 UNIT) tablet Take 1,000 Units by mouth daily.     clonazePAM (KLONOPIN) 0.5 MG tablet Take 1 tablet (0.5 mg total) by mouth 2 (two) times daily as needed for anxiety. 60 tablet 5   cyclobenzaprine (FLEXERIL) 5 MG tablet TAKE 1 TABLET BY MOUTH 3  TIMES DAILY AS NEEDED FOR  MUSCLE SPASM(S) 270 tablet 1   Diclofenac Sodium (PENNSAID) 2 % SOLN Place 2 g onto the skin 2 (two) times daily. 112 g 3   FLUoxetine (PROZAC) 40 MG capsule TAKE 1 CAPSULE BY MOUTH DAILY 100 capsule 3   furosemide (LASIX) 40 MG tablet TAKE 1 TABLET BY MOUTH DAILY 90 tablet 1   Gabapentin Enacarbil (HORIZANT) 600 MG TBCR Take 1 tablet (600 mg total) by mouth at bedtime. 90 tablet 3   glucose blood (ACCU-CHEK GUIDE) test strip Use as  instructed to check blood sugar 2X daily 200 each 3   JARDIANCE 25 MG TABS tablet TAKE 1 TABLET BY MOUTH DAILY  BEFORE BREAKFAST 100 tablet 2   Lancets Misc. (ACCU-CHEK FASTCLIX LANCET) KIT Use to check blood sugar once a day 1 kit 0   levothyroxine (SYNTHROID) 200 MCG tablet TAKE 1 TABLET BY MOUTH DAILY 100 tablet 2   levothyroxine (SYNTHROID) 25 MCG tablet TAKE 1 TABLET BY MOUTH DAILY  BEFORE BREAKFAST WITH 200 MCG  TABLET FOR A TOTAL DAILY DOSE OF 225 MCG 100 tablet 2   metFORMIN (GLUCOPHAGE-XR) 500 MG 24 hr tablet TAKE 1 TABLET BY MOUTH DAILY  WITH BREAKFAST 100 tablet 2   montelukast (SINGULAIR) 10 MG tablet TAKE 1 TABLET BY  MOUTH AT  BEDTIME 100 tablet 3   Multiple Vitamin (MULTIVITAMIN WITH MINERALS) TABS tablet Take 1 tablet by mouth daily.     ondansetron (ZOFRAN ODT) 4 MG disintegrating tablet Take 1 tablet (4 mg total) by mouth every 8 (eight) hours as needed for nausea or vomiting. 4 tablet 0   oxyCODONE (OXY IR/ROXICODONE) 5 MG immediate release tablet Take 5 mg by mouth every 6 (six) hours as needed.     OXYGEN 2lpm with sleep only  [facility]     primidone (MYSOLINE) 50 MG tablet TAKE 1 TABLET BY MOUTH IN THE  MORNING AND 3 TABLETS BY MOUTH  AT BEDTIME 360 tablet 3   rosuvastatin (CRESTOR) 40 MG tablet TAKE 1 TABLET BY MOUTH DAILY 100 tablet 2   Semaglutide,0.25 or 0.5MG /DOS, (OZEMPIC, 0.25 OR 0.5 MG/DOSE,) 2 MG/3ML SOPN 0.25 once wkly * 2 wks, then inc to 0.5 wkly     spironolactone (ALDACTONE) 25 MG tablet TAKE 1 TABLET BY MOUTH TWICE  DAILY 200 tablet 2   tirzepatide (MOUNJARO) 2.5 MG/0.5ML Pen Inject 2.5 mg into the skin once a week. 2 mL 1   No current facility-administered medications on file prior to visit.        ROS:  All others reviewed and negative.  Objective        PE:  BP 110/70 (BP Location: Left Arm, Patient Position: Sitting, Cuff Size: Large)   Pulse 81   Ht 5\' 1"  (1.549 m)   Wt 282 lb (127.9 kg)   SpO2 94%   BMI 53.28 kg/m                 Constitutional: Pt  appears in NAD               HENT: Head: NCAT.                Right Ear: External ear normal.                 Left Ear: External ear normal.                Eyes: . Pupils are equal, round, and reactive to light. Conjunctivae and EOM are normal               Nose: without d/c or deformity               Neck: Neck supple. Gross normal ROM               Cardiovascular: Normal rate and regular rhythm.                 Pulmonary/Chest: Effort normal and breath sounds without rales or wheezing.                Abd:  Soft, NT, ND, + BS, no organomegaly               Neurological: Pt is alert. At baseline orientation, motor grossly intact               Skin: Skin is warm. No rashes, no other new lesions, LE edema - trace bilateral               Psychiatric: Pt behavior is normal without agitation   Micro: none  Cardiac tracings I have personally interpreted today:  none  Pertinent Radiological findings (summarize): none   Lab Results  Component Value Date   WBC 9.6 01/26/2023   HGB 17.2 (H)  01/26/2023   HCT 52.7 (H) 01/26/2023   PLT 167.0 01/26/2023   GLUCOSE 100 (H) 01/26/2023   CHOL 156 01/26/2023   TRIG 281.0 (H) 01/26/2023   HDL 38.50 (L) 01/26/2023   LDLDIRECT 79.0 01/26/2023   LDLCALC 54 03/11/2022   ALT 23 01/26/2023   AST 19 01/26/2023   NA 135 01/26/2023   K 4.0 01/26/2023   CL 97 01/26/2023   CREATININE 0.56 01/26/2023   BUN 24 (H) 01/26/2023   CO2 28 01/26/2023   TSH 1.96 07/09/2023   HGBA1C 7.3 (A) 07/09/2023   MICROALBUR 2.6 (H) 01/26/2023   Assessment/Plan:  Autumn Massey is a 58 y.o. White or Caucasian [1] female with  has a past medical history of Anxiety, ANXIETY DEPRESSION (01/29/2009), Arthritis, Asthma, CHF (congestive heart failure) (HCC), DEPRESSION (11/19/2007), Diabetes (HCC), Dyspnea, FREQUENCY, URINARY (10/17/2009), HOH (hard of hearing), HYPERLIPIDEMIA (11/19/2007), HYPERTENSION (11/19/2007), HYPOTHYROIDISM (11/19/2007), Morbid obesity (HCC),  Neuropathy (01/26/2019), Neuropathy, PNEUMONIA (08/06/2010), Pneumonia, POLYCYTHEMIA (01/29/2009), Preventative health care (04/06/2011), Sleep apnea, SLEEP RELATED HYPOVENTILATION/HYPOXEMIA CCE (02/08/2009), and Venous insufficiency (07/19/2012).  Encounter for well adult exam with abnormal findings Age and sex appropriate education and counseling updated with regular exercise and diet Referrals for preventative services - declines eye exam referral, pt will call Gyn for exam, has colonoscopy soon  Immunizations addressed - declines covid booster, for shingrix at pharmacy, for flu shot today Smoking counseling  - pt counsled to quit, pt not ready Evidence for depression or other mood disorder - none significant Most recent labs reviewed. I have personally reviewed and have noted: 1) the patient's medical and social history 2) The patient's current medications and supplements 3) The patient's height, weight, and BMI have been recorded in the chart   Current every day smoker Pt counsled to quit, pt not ready  HLD (hyperlipidemia) Lab Results  Component Value Date   LDLCALC 54 03/11/2022   Stable, pt to continue current statin crestor 40 mg qd   Hypertension associated with diabetes (HCC) BP Readings from Last 3 Encounters:  07/29/23 110/70  07/28/23 111/70  07/09/23 138/72   Pt now low normal, ok to d/c avapro, pt to continue medical treatment ziac 5 6.25 mg qd   Hypothyroidism Lab Results  Component Value Date   TSH 1.96 07/09/2023   Stable, pt to continue levothyroxine 225 mcg qd    Type 2 diabetes mellitus with hyperglycemia, without long-term current use of insulin (HCC) Lab Results  Component Value Date   HGBA1C 7.3 (A) 07/09/2023   uncontrolled, pt to continue current medical treatment jardiance 25 every day, metformin ER 500 every day, and GLP1 asd; hopefull to have even better wt loss with current GLP1  Vitamin B12 deficiency Lab Results  Component Value  Date   VITAMINB12 682 01/26/2023   Stable, cont oral replacement - b12 1000 mcg qd   Vitamin D deficiency Last vitamin D Lab Results  Component Value Date   VD25OH 47.22 01/26/2023   Stable, cont oral replacement  Followup: Return in about 6 months (around 01/26/2024).  Oliver Barre, MD 08/02/2023 1:24 PM [facility]

## 2023-08-02 ENCOUNTER — Encounter: Payer: Self-pay | Admitting: Internal Medicine

## 2023-08-02 ENCOUNTER — Telehealth: Payer: Self-pay | Admitting: Internal Medicine

## 2023-08-02 NOTE — Assessment & Plan Note (Signed)
Lab Results  Component Value Date   TSH 1.96 07/09/2023   Stable, pt to continue levothyroxine 225 mcg qd

## 2023-08-02 NOTE — Assessment & Plan Note (Signed)
 Last vitamin D Lab Results  Component Value Date   VD25OH 47.22 01/26/2023   Stable, cont oral replacement

## 2023-08-02 NOTE — Addendum Note (Signed)
Addended by: Corwin Levins on: 08/02/2023 01:24 PM   Modules accepted: Orders

## 2023-08-02 NOTE — Assessment & Plan Note (Signed)
BP Readings from Last 3 Encounters:  07/29/23 110/70  07/28/23 111/70  07/09/23 138/72   Pt now low normal, ok to d/c avapro, pt to continue medical treatment ziac 5 6.25 mg qd

## 2023-08-02 NOTE — Assessment & Plan Note (Signed)
 Lab Results  Component Value Date   VITAMINB12 682 01/26/2023   Stable, cont oral replacement - b12 1000 mcg qd

## 2023-08-02 NOTE — Assessment & Plan Note (Signed)
Lab Results  Component Value Date   LDLCALC 54 03/11/2022   Stable, pt to continue current statin crestor 40 mg qd

## 2023-08-02 NOTE — Telephone Encounter (Signed)
Please to contact pt   On further review of her recent BP's - we should d/c her Avapro (irbesartan) as we can expect her BP to go lower with further wt loss and the BP will soon be too low.    thanks

## 2023-08-02 NOTE — Assessment & Plan Note (Addendum)
Lab Results  Component Value Date   HGBA1C 7.3 (A) 07/09/2023   uncontrolled, pt to continue current medical treatment jardiance 25 every day, metformin ER 500 every day, and GLP1 asd; hopefull to have even better wt loss with current GLP1

## 2023-08-02 NOTE — Assessment & Plan Note (Signed)
 Pt counsled to quit, pt not ready

## 2023-08-02 NOTE — Assessment & Plan Note (Addendum)
Age and sex appropriate education and counseling updated with regular exercise and diet Referrals for preventative services - declines eye exam referral, pt will call Gyn for exam, has colonoscopy soon  Immunizations addressed - declines covid booster, for shingrix at pharmacy, for flu shot today Smoking counseling  - pt counsled to quit, pt not ready Evidence for depression or other mood disorder - none significant Most recent labs reviewed. I have personally reviewed and have noted: 1) the patient's medical and social history 2) The patient's current medications and supplements 3) The patient's height, weight, and BMI have been recorded in the chart

## 2023-08-03 NOTE — Telephone Encounter (Signed)
 Called and let Pt know

## 2023-08-10 MED ORDER — PREDNISONE 10 MG PO TABS: ORAL_TABLET | ORAL | 0 refills | Status: DC

## 2023-08-10 NOTE — Addendum Note (Signed)
 Addended by: Roslyn Coombe on: 08/10/2023 12:12 PM   Modules accepted: Orders

## 2023-08-11 ENCOUNTER — Encounter: Payer: Medicare Other | Admitting: Gastroenterology

## 2023-08-24 ENCOUNTER — Encounter: Payer: Self-pay | Admitting: Internal Medicine

## 2023-08-31 ENCOUNTER — Ambulatory Visit: Payer: Medicare Other | Admitting: Internal Medicine

## 2023-09-01 ENCOUNTER — Ambulatory Visit (INDEPENDENT_AMBULATORY_CARE_PROVIDER_SITE_OTHER): Payer: Medicare Other | Admitting: Family Medicine

## 2023-09-17 ENCOUNTER — Other Ambulatory Visit: Payer: Self-pay

## 2023-09-17 DIAGNOSIS — Z1211 Encounter for screening for malignant neoplasm of colon: Secondary | ICD-10-CM

## 2023-10-06 ENCOUNTER — Encounter (INDEPENDENT_AMBULATORY_CARE_PROVIDER_SITE_OTHER): Payer: Self-pay

## 2023-10-06 ENCOUNTER — Ambulatory Visit (INDEPENDENT_AMBULATORY_CARE_PROVIDER_SITE_OTHER): Admitting: Physician Assistant

## 2023-10-09 ENCOUNTER — Other Ambulatory Visit: Payer: Self-pay | Admitting: Internal Medicine

## 2023-10-09 DIAGNOSIS — E1165 Type 2 diabetes mellitus with hyperglycemia: Secondary | ICD-10-CM

## 2023-10-12 ENCOUNTER — Other Ambulatory Visit: Payer: Self-pay

## 2023-10-13 ENCOUNTER — Ambulatory Visit (INDEPENDENT_AMBULATORY_CARE_PROVIDER_SITE_OTHER): Admitting: Family Medicine

## 2023-10-13 ENCOUNTER — Encounter (INDEPENDENT_AMBULATORY_CARE_PROVIDER_SITE_OTHER): Payer: Self-pay | Admitting: Family Medicine

## 2023-10-13 VITALS — BP 121/75 | HR 83 | Temp 98.6°F | Ht 61.0 in | Wt 283.0 lb

## 2023-10-13 DIAGNOSIS — E1159 Type 2 diabetes mellitus with other circulatory complications: Secondary | ICD-10-CM

## 2023-10-13 DIAGNOSIS — Z6841 Body Mass Index (BMI) 40.0 and over, adult: Secondary | ICD-10-CM

## 2023-10-13 DIAGNOSIS — E559 Vitamin D deficiency, unspecified: Secondary | ICD-10-CM

## 2023-10-13 DIAGNOSIS — E669 Obesity, unspecified: Secondary | ICD-10-CM

## 2023-10-13 DIAGNOSIS — E1169 Type 2 diabetes mellitus with other specified complication: Secondary | ICD-10-CM

## 2023-10-13 DIAGNOSIS — I152 Hypertension secondary to endocrine disorders: Secondary | ICD-10-CM

## 2023-10-13 DIAGNOSIS — Z7984 Long term (current) use of oral hypoglycemic drugs: Secondary | ICD-10-CM | POA: Diagnosis not present

## 2023-10-13 DIAGNOSIS — Z7985 Long-term (current) use of injectable non-insulin antidiabetic drugs: Secondary | ICD-10-CM | POA: Diagnosis not present

## 2023-10-13 NOTE — Progress Notes (Signed)
 Rae Bugler, D.O.  ABFM, ABOM Specializing in Clinical Bariatric Medicine  Office located at: [address] [place], [place]  [address]   Assessment and Plan:   FOR THE DISEASE OF OBESITY:  BMI 50.0-59.9, adult (HCC)-current BMI 53.5 Morbid obesity (HCC): Starting BMI 54.4 Assessment & Plan: Since last office visit on 07/28/2023 patient's  Muscle mass has decreased by 1.6 lb. Fat mass has increased by 2.6 lb. Total body water was not registered on the scale today. Counseling done on how various foods will affect these numbers and how to maximize success  Total lbs lost to date: 5 lbs  Total weight loss percentage to date: 1.74%    Recommended Dietary Goals Autumn Massey is currently in the action stage of change. As such, her goal is to continue weight management plan.  She has agreed to: continue current plan   Behavioral Intervention We discussed the following today: increasing walking (to eventual goal of 15,000 steps daily),  increasing lean protein intake to established goals, work on meal planning and preparation, and discussed pre-packaged healthier meals such as [employer] meal prep.  Additional resources provided today: Handout on CAT 3 meal plan, Handout on CAT 3-4 breakfast options, Handout on CAT 3-4 lunch options, and Handout on Common Characteristics of Successful Weight Losers,  Evidence-based interventions for health behavior change were utilized today including the discussion of self monitoring techniques, problem-solving barriers and SMART goal setting techniques.   Regarding patient's less desirable eating habits and patterns, we employed the technique of small changes.   Pt will specifically work on: eating EVERYTHING on plan (currently she has been skipping important proteins on her MP like the fair life milk and greek yogurt)    Recommended Physical Activity Goals Autumn Massey has been advised to work up to 300-450 minutes of moderate intensity aerobic activity  a week and strengthening exercises 2-3 times per week for cardiovascular health, weight loss maintenance and preservation of muscle mass.   She has agreed to : continue app exercises and START intentionally walking 15 minutes daily.   Pharmacotherapy We both agreed to :  continue same regimen   FOR ASSOCIATED CONDITIONS ADDRESSED TODAY:   Type 2 diabetes mellitus with obesity [facility]) Assessment & Plan: Lab Results  Component Value Date   HGBA1C 7.3 (A) 07/09/2023   HGBA1C 8.1 (H) 05/25/2023   HGBA1C 7.0 (H) 01/26/2023   INSULIN  7.5 03/11/2022    Condition managed by Dr.Gerghe. Most recent Hemoglobin A1c and fasting insulin  as above. Denies symptoms of hypoglycemia or hyperglycemia. On Ozempic  0.25 mg weekly, Jardiance  25 mg daily, and Metformin  ER 500 mg daily with good adherence and no side effects. Hunger/cravings controlled.  Maintain with current regimen. Continue working on nutrition plan to decrease simple carbohydrates, increase lean proteins and exercise to promote weight loss and improve glycemic control.   Hypertension associated with type 2 diabetes mellitus (HCC) Assessment & Plan: Last 3 blood pressure readings in our office are as follows: BP Readings from Last 3 Encounters:  10/13/23 121/75  07/29/23 110/70  07/28/23 111/70   The 10-year ASCVD risk score (Arnett DK, et al., 2019) is: 13.3%  Lab Results  Component Value Date   CREATININE 0.56 01/26/2023   Blood pressure is well-controlled today on Spironolactone  and Bisoprolol -hydrochlorothiazide . Her PCP recently discontinued Irbesaratan. Informed pt that Bisoprolol  can be counterproductive to weight loss: she will discuss this with Dr.John in the future. Continue same regimen for now. Continue low sodium diet, advance exercise as able.  Vitamin D  deficiency Assessment & Plan: Lab Results  Component Value Date   VD25OH 47.22 01/26/2023   VD25OH 83.22 09/11/2022   VD25OH 39.5 03/11/2022   Most recent  Vitamin D  as above. Pt is doing well on OTC Cholecalciferol  1,000 units daily. Continue regimen. Recheck periodically.   Follow up:   Return 11/17/2023 with Shawn [name] PA. She was informed of the importance of frequent follow up visits to maximize her success with intensive lifestyle modifications for her multiple health conditions.  Subjective:   Chief complaint: Obesity Autumn Massey is here to discuss her progress with her obesity treatment plan. She is on the Category 3 MP with 100 snack calories and B/L options and states she is following her eating plan approximately 75-80% of the time. She states she is doing various exercises from her app 7-10 minutes 5-7 days per week. Also walks 3500 steps daily.   Interval History:  Autumn Massey is here for a follow up office visit. Since last OV on 07/28/2023, Autumn Massey is up 1 lb. For breakfast, she has an omelette consisting of 3 egg whites. States she is getting in 4 ounces of lean protein at lunch and 8 ounces at dinner. Acknowledges not getting in the other protein items on her meal plan (e.g the greek yogurt or Fair Life milk).   Pharmacotherapy that aid with weight loss: She is currently taking Ozempic  0.25 mg once a week and Metformin  XR 500 mg once daily.   Review of Systems:  Pertinent positives were addressed with patient today.  Reviewed by clinician on day of visit: allergies, medications, problem list, medical history, surgical history, family history, social history, and previous encounter notes.  Weight Summary and Biometrics   Weight Lost Since Last Visit: 0  Weight Gained Since Last Visit: 1lb   Vitals Temp: 98.6 F (37 C) BP: 121/75 Pulse Rate: 83 SpO2: 94 %   Anthropometric Measurements Height: 5\' 1"  (1.549 m) Weight: 283 lb (128.4 kg) BMI (Calculated): 53.5 Weight at Last Visit: 282lb Weight Lost Since Last Visit: 0 Weight Gained Since Last Visit: 1lb Starting Weight: 288lb Total Weight Loss (lbs): 5 lb (2.268  kg)   Body Composition  Body Fat %: 56.9 % Fat Mass (lbs): 161.2 lbs Muscle Mass (lbs): 115.8 lbs Visceral Fat Rating : 23   Other Clinical Data Fasting: no Labs: no Today's Visit #: 21 Starting Date: 03/11/22   Objective:   PHYSICAL EXAM: Blood pressure 121/75, pulse 83, temperature 98.6 F (37 C), height 5\' 1"  (1.549 m), weight 283 lb (128.4 kg), SpO2 94%. Body mass index is 53.47 kg/m.  General: she is overweight, cooperative and in no acute distress. PSYCH: Has normal mood, affect and thought process.   HEENT: EOMI, sclerae are anicteric. Lungs: Normal breathing effort, no conversational dyspnea. Extremities: Moves * 4 Neurologic: A and O * 3, good insight  DIAGNOSTIC DATA REVIEWED: BMET    Component Value Date/Time   NA 135 01/26/2023 1503   NA 137 03/11/2022 1554   K 4.0 01/26/2023 1503   CL 97 01/26/2023 1503   CO2 28 01/26/2023 1503   GLUCOSE 100 (H) 01/26/2023 1503   BUN 24 (H) 01/26/2023 1503   BUN 11 03/11/2022 1554   CREATININE 0.56 01/26/2023 1503   CREATININE 0.73 03/02/2018 1157   CALCIUM  10.3 01/26/2023 1503   GFRNONAA >60 09/11/2022 0815   GFRNONAA 95 03/02/2018 1157   GFRAA >60 04/26/2019 1445   GFRAA 110 03/02/2018 1157   Lab Results  Component Value Date   HGBA1C 7.3 (A) 07/09/2023   HGBA1C 5.6 03/15/2012   Lab Results  Component Value Date   INSULIN  7.5 03/11/2022   Lab Results  Component Value Date   TSH 1.96 07/09/2023   CBC    Component Value Date/Time   WBC 9.6 01/26/2023 1503   RBC 5.52 (H) 01/26/2023 1503   HGB 17.2 (H) 01/26/2023 1503   HGB 19.6 (H) 03/11/2022 1554   HCT 52.7 (H) 01/26/2023 1503   HCT 57.0 (H) 03/11/2022 1554   PLT 167.0 01/26/2023 1503   PLT 167 03/11/2022 1554   MCV 95.3 01/26/2023 1503   MCV 92 03/11/2022 1554   MCH 30.3 09/11/2022 0815   MCHC 32.7 01/26/2023 1503   RDW 13.9 01/26/2023 1503   RDW 12.1 03/11/2022 1554   Iron Studies    Component Value Date/Time   IRON 93 01/26/2019  0959   FERRITIN 124.5 01/29/2009 0905   IRONPCTSAT 31.3 01/26/2019 0959   Lipid Panel     Component Value Date/Time   CHOL 156 01/26/2023 1503   CHOL 130 03/11/2022 1554   TRIG 281.0 (H) 01/26/2023 1503   HDL 38.50 (L) 01/26/2023 1503   HDL 34 (L) 03/11/2022 1554   CHOLHDL 4 01/26/2023 1503   VLDL 56.2 (H) 01/26/2023 1503   LDLCALC 54 03/11/2022 1554   LDLCALC 109 (H) 01/27/2020 1006   LDLDIRECT 79.0 01/26/2023 1503   Hepatic Function Panel     Component Value Date/Time   PROT 7.5 01/26/2023 1503   PROT 6.9 03/11/2022 1554   ALBUMIN  4.1 01/26/2023 1503   ALBUMIN  4.3 03/11/2022 1554   AST 19 01/26/2023 1503   ALT 23 01/26/2023 1503   ALKPHOS 64 01/26/2023 1503   BILITOT 0.7 01/26/2023 1503   BILITOT 0.7 03/11/2022 1554   BILIDIR 0.1 01/26/2023 1503   IBILI 0.4 01/27/2020 1006      Component Value Date/Time   TSH 1.96 07/09/2023 1553   Nutritional Lab Results  Component Value Date   VD25OH 47.22 01/26/2023   VD25OH 83.22 09/11/2022   VD25OH 39.5 03/11/2022    Attestations:   I, Special Puri, acting as a Stage manager for [employer], DO., have compiled all relevant documentation for today's office visit on behalf of Marceil Sensor, DO, while in the presence of [employer], DO.  Reviewed by clinician on day of visit: allergies, medications, problem list, medical history, surgical history, family history, social history, and previous encounter notes pertinent to patient's obesity diagnosis.  48 minutes of direct face to face counseling was done regarding her diabetes, appropriate A1c levels, how to eat the right amounts of foods and proteins (which she is not doing), meal planning/prepping, and making healthy substitutions. We also discussed the obesogenic drugs she is on and I recommend she discuss with those treating physicians about possible changes in therapy. This was all in addition to the above education and counseling.   I have reviewed the above  documentation for accuracy and completeness, and I agree with the above. Rae Bugler, D.O.  The 21st Century Cures Act was signed into law in 2016 which includes the topic of electronic health records.  This provides immediate access to information in MyChart.  This includes consultation notes, operative notes, office notes, lab results and pathology reports.  If you have any questions about what you read please let us  know at your next visit so we can discuss your concerns and take corrective action if need be.  We  are right here with you.

## 2023-10-19 ENCOUNTER — Telehealth: Admitting: Physician Assistant

## 2023-10-19 ENCOUNTER — Telehealth: Admitting: Nurse Practitioner

## 2023-10-19 DIAGNOSIS — R509 Fever, unspecified: Secondary | ICD-10-CM

## 2023-10-19 DIAGNOSIS — J441 Chronic obstructive pulmonary disease with (acute) exacerbation: Secondary | ICD-10-CM

## 2023-10-19 DIAGNOSIS — R051 Acute cough: Secondary | ICD-10-CM

## 2023-10-19 MED ORDER — PREDNISONE 20 MG PO TABS: 20.0000 mg | ORAL_TABLET | Freq: Two times a day (BID) | ORAL | 0 refills | Status: AC

## 2023-10-19 MED ORDER — AZITHROMYCIN 250 MG PO TABS: ORAL_TABLET | ORAL | 0 refills | Status: AC

## 2023-10-19 NOTE — Progress Notes (Signed)
 Virtual Visit Consent   Autumn Massey, you are scheduled for a virtual visit with a [facility] provider today. Just as with appointments in the office, your consent must be obtained to participate. Your consent will be active for this visit and any virtual visit you may have with one of our providers in the next 365 days. If you have a MyChart account, a copy of this consent can be sent to you electronically.  As this is a virtual visit, video technology does not allow for your provider to perform a traditional examination. This may limit your provider's ability to fully assess your condition. If your provider identifies any concerns that need to be evaluated in person or the need to arrange testing (such as labs, EKG, etc.), we will make arrangements to do so. Although advances in technology are sophisticated, we cannot ensure that it will always work on either your end or our end. If the connection with a video visit is poor, the visit may have to be switched to a telephone visit. With either a video or telephone visit, we are not always able to ensure that we have a secure connection.  By engaging in this virtual visit, you consent to the provision of healthcare and authorize for your insurance to be billed (if applicable) for the services provided during this visit. Depending on your insurance coverage, you may receive a charge related to this service.  I need to obtain your verbal consent now. Are you willing to proceed with your visit today? Autumn Massey has provided verbal consent on 10/19/2023 for a virtual visit (video or telephone). Mardene Shake, FNP  Date: 10/19/2023 4:55 PM   Virtual Visit via Video Note   I, Mardene Shake, connected with  Autumn Massey  ([id number], 1966-06-06) on 10/19/23 at  5:15 PM EDT by a video-enabled telemedicine application and verified that I am speaking with the correct person using two identifiers.  Location: Patient: Virtual Visit Location Patient:  Home Provider: Virtual Visit Location Provider: Home Office   I discussed the limitations of evaluation and management by telemedicine and the availability of in person appointments. The patient expressed understanding and agreed to proceed.    History of Present Illness: Autumn Massey is a 58 y.o. who identifies as a female who was assigned female at birth, and is being seen today for coughing wheezing and a fever  She first started to feel sick 3 days ago with a scratchy throat   Over the past 48 hours she has had acute cough/wheezing and fever onset yesterday up to 101.4  She was taking tylenol  for fever relief   She has completed two nebulizer Albuterol  treatments today  She also uses her Breo daily   History of COPD/Asthma  She does have oxygen  at night  She has been monitoring her oxygen  levels and notes they are dropping with activity   Most recent COPD exacerbation was February similar symptoms at that time + sinus congestion and negative for COVID at that time  She has also taken a COVID test in the past 48 hours that was negative     Problems:  Patient Active Problem List   Diagnosis Date Noted   COVID-19 virus infection 02/15/2023   Type 2 diabetes mellitus with obesity (HCC) 03/26/2022   Hypertension due to endocrine disorder 03/26/2022   Vitamin B12 deficiency 03/26/2022   At risk for impaired metabolic function 03/26/2022   Class 3 severe obesity with serious comorbidity and  body mass index (BMI) of 50.0 to 59.9 in adult [facility]) 03/26/2022   Hyperlipidemia associated with type 2 diabetes mellitus (HCC) 03/26/2022   Hypertension associated with diabetes (HCC) 03/26/2022   At risk for heart disease 03/26/2022   Chronic pain 01/18/2022   Panniculitis 07/30/2021   HNP (herniated nucleus pulposus), cervical 06/19/2021   Cervical disc herniation 06/19/2021   Cervical radiculopathy 04/05/2021   Abdominal pain, generalized 04/05/2021   Constipation 04/05/2021    Right knee pain 10/08/2020   Scratch of right lower leg 10/08/2020   History of COVID-19 09/07/2020   Intermittent chest pain 09/07/2020   Pneumonia due to COVID-19 virus 08/13/2020   Viral illness 07/30/2020   Vitamin D  deficiency 01/29/2020   Grief reaction 01/27/2020   Chronic right sacroiliac joint pain 09/05/2019   Degenerative arthritis of right knee 09/05/2019   Generalized anxiety disorder 03/08/2019   Hearing loss 03/08/2019   Neuropathy 01/26/2019   Right hip pain 04/14/2018   COPD exacerbation (HCC) 04/16/2017   Metatarsal stress fracture of left foot 12/08/2016   Stress fracture of left foot 12/08/2016   Dysuria 11/04/2016   Low serum cortisol level 10/21/2016   Leukocytosis 10/15/2016   Hyponatremia 10/14/2016   AKI (acute kidney injury) (HCC) 10/14/2016   Nausea 10/09/2016   Leg cramps 10/09/2016   Community acquired pneumonia 09/28/2016   Chronic diastolic congestive heart failure (HCC) 09/28/2016   Asthma exacerbation 12/20/2015   Type 2 diabetes mellitus with hyperglycemia, without long-term current use of insulin  (HCC) 11/19/2015   Dyspnea 10/09/2015   Chronic respiratory failure with hypoxia and hypercapnia (HCC) 07/19/2015   COPD GOLD 0 / AB 07/18/2015   Vaginitis 05/31/2015   Thoracic outlet syndrome 01/29/2015   Ulnar neuropathy at elbow of right upper extremity 12/19/2014   Benign essential tremor 10/13/2014   Movement disorder 08/30/2014   Cough 08/30/2014   Rash and nonspecific skin eruption 03/07/2014   Skin lesion of back 03/07/2014   PCOS (polycystic ovarian syndrome) 10/31/2013   History of CVA (cerebrovascular accident) 08/11/2013   Asthmatic bronchitis 06/10/2013   Acute meniscal tear of right knee 06/06/2013   Sprain of MCL (medial collateral ligament) of knee 05/03/2013   Left arm pain 02/09/2013   Hypoxemia 07/19/2012   Venous insufficiency 07/19/2012   Morbid obesity (HCC) 07/19/2012   Current every day smoker 04/11/2011   Encounter  for well adult exam with abnormal findings 04/06/2011   SLEEP RELATED HYPOVENTILATION/HYPOXEMIA CCE 02/08/2009   Polycythemia 01/29/2009   ANXIETY DEPRESSION 01/29/2009   Hypothyroidism 11/19/2007   HLD (hyperlipidemia) 11/19/2007   Depression 11/19/2007   Essential hypertension 11/19/2007    Allergies:  Allergies  Allergen Reactions   Cymbalta  [Duloxetine  Hcl]     Altered mental state / agitated    Medications:  Current Outpatient Medications:    ACCU-CHEK GUIDE TEST test strip, USE AS INSTRUCTED TO CHECK BLOOD SUGAR TWICE DAILY, Disp: 200 strip, Rfl: 2   Accu-Chek Softclix Lancets lancets, USE AS INSTRUCTED TO CHECK BLOOD SUGAR ONCE DAILY, Disp: 100 each, Rfl: 2   albuterol  (PROVENTIL ) (2.5 MG/3ML) 0.083% nebulizer solution, Take 3 mLs (2.5 mg total) by nebulization every 6 (six) hours as needed for wheezing or shortness of breath., Disp: 75 mL, Rfl: 3   albuterol  (VENTOLIN  HFA) 108 (90 Base) MCG/ACT inhaler, USE 2 INHALATIONS BY MOUTH EVERY 6 HOURS AS NEEDED, Disp: 34 g, Rfl: 2   aspirin  EC 81 MG tablet, Take 81 mg by mouth daily., Disp: , Rfl:    Azelastine -Fluticasone   137-50 MCG/ACT SUSP, Place 1 spray into the nose every 12 (twelve) hours., Disp: 23 g, Rfl: 0   bisoprolol -hydrochlorothiazide  (ZIAC ) 5-6.25 MG tablet, TAKE 1 TABLET BY MOUTH DAILY, Disp: 100 tablet, Rfl: 2   Blood Glucose Monitoring Suppl (ACCU-CHEK GUIDE) w/Device KIT, Use as instructed to check blood sugar 2X daily, Disp: 1 kit, Rfl: 0   BREO ELLIPTA  100-25 MCG/ACT AEPB, USE 1 INHALATION BY MOUTH ONCE  DAILY AT THE SAME TIME EACH DAY, Disp: 180 each, Rfl: 3   cholecalciferol  (VITAMIN D3) 25 MCG (1000 UNIT) tablet, Take 1,000 Units by mouth daily., Disp: , Rfl:    clonazePAM  (KLONOPIN ) 0.5 MG tablet, Take 1 tablet (0.5 mg total) by mouth 2 (two) times daily as needed for anxiety., Disp: 60 tablet, Rfl: 5   cyclobenzaprine  (FLEXERIL ) 5 MG tablet, TAKE 1 TABLET BY MOUTH 3  TIMES DAILY AS NEEDED FOR  MUSCLE SPASM(S),  Disp: 270 tablet, Rfl: 1   Diclofenac  Sodium (PENNSAID ) 2 % SOLN, Place 2 g onto the skin 2 (two) times daily., Disp: 112 g, Rfl: 3   FLUoxetine  (PROZAC ) 40 MG capsule, TAKE 1 CAPSULE BY MOUTH DAILY, Disp: 100 capsule, Rfl: 2   furosemide  (LASIX ) 40 MG tablet, TAKE 1 TABLET BY MOUTH DAILY, Disp: 90 tablet, Rfl: 1   Gabapentin  Enacarbil (HORIZANT ) 600 MG TBCR, Take 1 tablet (600 mg total) by mouth at bedtime., Disp: 90 tablet, Rfl: 3   JARDIANCE  25 MG TABS tablet, TAKE 1 TABLET BY MOUTH DAILY  BEFORE BREAKFAST, Disp: 100 tablet, Rfl: 2   Lancets Misc. (ACCU-CHEK FASTCLIX LANCET) KIT, Use to check blood sugar once a day, Disp: 1 kit, Rfl: 0   levothyroxine  (SYNTHROID ) 200 MCG tablet, TAKE 1 TABLET BY MOUTH DAILY, Disp: 100 tablet, Rfl: 2   levothyroxine  (SYNTHROID ) 25 MCG tablet, TAKE 1 TABLET BY MOUTH DAILY  BEFORE BREAKFAST WITH 200 MCG  TABLET FOR A TOTAL DAILY DOSE OF 225 MCG, Disp: 100 tablet, Rfl: 2   metFORMIN  (GLUCOPHAGE -XR) 500 MG 24 hr tablet, TAKE 1 TABLET BY MOUTH DAILY  WITH BREAKFAST, Disp: 100 tablet, Rfl: 2   montelukast  (SINGULAIR ) 10 MG tablet, TAKE 1 TABLET BY MOUTH AT  BEDTIME, Disp: 100 tablet, Rfl: 2   Multiple Vitamin (MULTIVITAMIN WITH MINERALS) TABS tablet, Take 1 tablet by mouth daily., Disp: , Rfl:    ondansetron  (ZOFRAN  ODT) 4 MG disintegrating tablet, Take 1 tablet (4 mg total) by mouth every 8 (eight) hours as needed for nausea or vomiting., Disp: 4 tablet, Rfl: 0   oxyCODONE  (OXY IR/ROXICODONE ) 5 MG immediate release tablet, Take 5 mg by mouth every 6 (six) hours as needed., Disp: , Rfl:    OXYGEN , 2lpm with sleep only  [facility], Disp: , Rfl:    predniSONE  (DELTASONE ) 10 MG tablet, 3 tabs by mouth per day for 3 days,2tabs per day for 3 days,1tab per day for 3 days (Patient not taking: Reported on 10/13/2023), Disp: 18 tablet, Rfl: 0   primidone  (MYSOLINE ) 50 MG tablet, TAKE 1 TABLET BY MOUTH IN THE  MORNING AND 3 TABLETS BY MOUTH  AT BEDTIME, Disp: 360 tablet, Rfl: 3    rosuvastatin  (CRESTOR ) 40 MG tablet, TAKE 1 TABLET BY MOUTH DAILY, Disp: 100 tablet, Rfl: 2   Semaglutide ,0.25 or 0.5MG /DOS, (OZEMPIC , 0.25 OR 0.5 MG/DOSE,) 2 MG/3ML SOPN, 0.25 once wkly * 2 wks, then inc to 0.5 wkly, Disp: , Rfl:    spironolactone  (ALDACTONE ) 25 MG tablet, TAKE 1 TABLET BY MOUTH TWICE  DAILY, Disp: 200  tablet, Rfl: 2   tirzepatide (MOUNJARO) 2.5 MG/0.5ML Pen, Inject 2.5 mg into the skin once a week., Disp: 2 mL, Rfl: 1  Observations/Objective: Patient is well-developed, well-nourished in no acute distress.  Resting comfortably  at home.  Head is normocephalic, atraumatic.  No labored breathing.  Speech is clear and coherent with logical content.  Patient is alert and oriented at baseline.    Assessment and Plan:   1. COPD exacerbation (HCC)  May continue tylenol  as needed for fever  Prefers cough drops for cough relief   Follow up with new/persistent or worsening symptoms   Meds ordered this encounter  Medications   azithromycin  (ZITHROMAX ) 250 MG tablet    Sig: Take 2 tablets on day 1, then 1 tablet daily on days 2 through 5    Dispense:  6 tablet    Refill:  0   predniSONE  (DELTASONE ) 20 MG tablet    Sig: Take 1 tablet (20 mg total) by mouth 2 (two) times daily with a meal for 5 days.    Dispense:  10 tablet    Refill:  0     Follow Up Instructions: I discussed the assessment and treatment plan with the patient. The patient was provided an opportunity to ask questions and all were answered. The patient agreed with the plan and demonstrated an understanding of the instructions.  A copy of instructions were sent to the patient via MyChart unless otherwise noted below.    The patient was advised to call back or seek an in-person evaluation if the symptoms worsen or if the condition fails to improve as anticipated.    Mardene Shake, FNP

## 2023-10-19 NOTE — Progress Notes (Signed)
   Thank you for the details you included in the comment boxes. Those details are very helpful in determining the best course of treatment for you and help us  to provide the best care. Because of having oxygen  drops, we recommend that you schedule a Virtual Urgent Care video visit in order for the provider to better assess what is going on and assess your breathing.  The provider will be able to give you a more accurate diagnosis and treatment plan if we can more freely discuss your symptoms and with the addition of a virtual examination.   If you change your visit to a video visit, we will bill your insurance (similar to an office visit) and you will not be charged for this e-Visit. You will be able to stay at home and speak with the first available [facility] advanced practice provider. The link to do a video visit is in the drop down Menu tab of your Welcome screen in MyChart.      I have spent 5 minutes in review of e-visit questionnaire, review and updating patient chart, medical decision making and response to patient.   Angelia Kelp, PA-C

## 2023-10-20 DIAGNOSIS — M50222 Other cervical disc displacement at C5-C6 level: Secondary | ICD-10-CM | POA: Diagnosis not present

## 2023-11-09 ENCOUNTER — Ambulatory Visit: Payer: Medicare Other | Admitting: Internal Medicine

## 2023-11-10 ENCOUNTER — Encounter: Payer: Self-pay | Admitting: Internal Medicine

## 2023-11-11 MED ORDER — EMPAGLIFLOZIN 25 MG PO TABS: 25.0000 mg | ORAL_TABLET | Freq: Every day | ORAL | 5 refills | Status: DC

## 2023-11-17 ENCOUNTER — Ambulatory Visit (INDEPENDENT_AMBULATORY_CARE_PROVIDER_SITE_OTHER): Admitting: Physician Assistant

## 2023-11-19 ENCOUNTER — Telehealth: Admitting: Physician Assistant

## 2023-11-19 DIAGNOSIS — B9689 Other specified bacterial agents as the cause of diseases classified elsewhere: Secondary | ICD-10-CM

## 2023-11-19 DIAGNOSIS — J019 Acute sinusitis, unspecified: Secondary | ICD-10-CM

## 2023-11-19 MED ORDER — AMOXICILLIN-POT CLAVULANATE 875-125 MG PO TABS: 1.0000 | ORAL_TABLET | Freq: Two times a day (BID) | ORAL | 0 refills | Status: DC

## 2023-11-19 NOTE — Progress Notes (Signed)
 I have spent 5 minutes in review of e-visit questionnaire, review and updating patient chart, medical decision making and response to patient.   Piedad Climes, PA-C

## 2023-11-19 NOTE — Progress Notes (Signed)
 E-Visit for Sinus Problems  We are sorry that you are not feeling well.  Here is how we plan to help!  Based on what you have shared with me it looks like you have sinusitis.  Sinusitis is inflammation and infection in the sinus cavities of the head.  Based on your presentation I believe you most likely have Acute Bacterial Sinusitis.  This is an infection caused by bacteria and is treated with antibiotics. I have prescribed Augmentin 875mg /125mg  one tablet twice daily with food, for 7 days. You may use an oral decongestant such as Mucinex D or if you have glaucoma or high blood pressure use plain Mucinex. Saline nasal spray help and can safely be used as often as needed for congestion.  If you develop worsening sinus pain, fever or notice severe headache and vision changes, or if symptoms are not better after completion of antibiotic, please schedule an appointment with a health care provider.    Sinus infections are not as easily transmitted as other respiratory infection, however we still recommend that you avoid close contact with loved ones, especially the very young and elderly.  Remember to wash your hands thoroughly throughout the day as this is the number one way to prevent the spread of infection!  Home Care: Only take medications as instructed by your medical team. Complete the entire course of an antibiotic. Do not take these medications with alcohol. A steam or ultrasonic humidifier can help congestion.  You can place a towel over your head and breathe in the steam from hot water coming from a faucet. Avoid close contacts especially the very young and the elderly. Cover your mouth when you cough or sneeze. Always remember to wash your hands.  Get Help Right Away If: You develop worsening fever or sinus pain. You develop a severe head ache or visual changes. Your symptoms persist after you have completed your treatment plan.  Make sure you Understand these instructions. Will watch  your condition. Will get help right away if you are not doing well or get worse.  Thank you for choosing an e-visit.  Your e-visit answers were reviewed by a board certified advanced clinical practitioner to complete your personal care plan. Depending upon the condition, your plan could have included both over the counter or prescription medications.  Please review your pharmacy choice. Make sure the pharmacy is open so you can pick up prescription now. If there is a problem, you may contact your provider through [employer] and have the prescription routed to another pharmacy.  Your safety is important to [name]. If you have drug allergies check your prescription carefully.   For the next 24 hours you can use MyChart to ask questions about today's visit, request a non-urgent call back, or ask for a work or school excuse. You will get an email in the next two days asking about your experience. I hope that your e-visit has been valuable and will speed your recovery.

## 2023-11-23 ENCOUNTER — Encounter: Payer: Self-pay | Admitting: Internal Medicine

## 2023-11-23 ENCOUNTER — Telehealth: Admitting: Physician Assistant

## 2023-11-23 DIAGNOSIS — J4531 Mild persistent asthma with (acute) exacerbation: Secondary | ICD-10-CM

## 2023-11-23 MED ORDER — PREDNISONE 10 MG PO TABS: 10.0000 mg | ORAL_TABLET | Freq: Every day | ORAL | 0 refills | Status: DC

## 2023-11-23 MED ORDER — ALBUTEROL SULFATE (2.5 MG/3ML) 0.083% IN NEBU: 2.5000 mg | INHALATION_SOLUTION | Freq: Four times a day (QID) | RESPIRATORY_TRACT | 0 refills | Status: DC | PRN

## 2023-11-23 NOTE — Progress Notes (Signed)
 Virtual Visit Consent   Autumn Massey, you are scheduled for a virtual visit with a [facility] [place] provider today. Just as with appointments in the office, your consent must be obtained to participate. Your consent will be active for this visit and any virtual visit you may have with one of our providers in the next 365 days. If you have a MyChart account, a copy of this consent can be sent to you electronically.  As this is a virtual visit, video technology does not allow for your provider to perform a traditional examination. This may limit your provider's ability to fully assess your condition. If your provider identifies any concerns that need to be evaluated in person or the need to arrange testing (such as labs, EKG, etc.), we will make arrangements to do so. Although advances in technology are sophisticated, we cannot ensure that it will always work on either your end or our end. If the connection with a video visit is poor, the visit may have to be switched to a telephone visit. With either a video or telephone visit, we are not always able to ensure that we have a secure connection.  By engaging in this virtual visit, you consent to the provision of healthcare and authorize for your insurance to be billed (if applicable) for the services provided during this visit. Depending on your insurance coverage, you may receive a charge related to this service.  I need to obtain your verbal consent now. Are you willing to proceed with your visit today? Autumn Massey has provided verbal consent on 11/23/2023 for a virtual visit (video or telephone). Angelia Kelp, PA-C  Date: 11/23/2023 3:41 PM   Virtual Visit via Video Note   I, Angelia Kelp, connected with  Autumn Massey  ([id number], 1965/08/03) on 11/23/23 at  3:30 PM EDT by a video-enabled telemedicine application and verified that I am speaking with the correct person using two identifiers.  Location: Patient: Virtual Visit  Location Patient: Home Provider: Virtual Visit Location Provider: Home Office   I discussed the limitations of evaluation and management by telemedicine and the availability of in person appointments. The patient expressed understanding and agreed to proceed.    History of Present Illness: Autumn Massey is a 58 y.o. who identifies as a female who was assigned female at birth, and is being seen today for cough and congestion. Completed an E-visit questionnaire virtually on 11/19/23 and started on Augmentin  for a sinus infection. Symptoms of sinus infection are improving, but now with chest tightness, wheezing, and cough consistent with her previous bronchitis flares. Requesting a steroid to take with the antibiotic to help the chest symptoms. Also requesting a refill of nebulizer medication.   Problems:  Patient Active Problem List   Diagnosis Date Noted   COVID-19 virus infection 02/15/2023   Type 2 diabetes mellitus with obesity (HCC) 03/26/2022   Hypertension due to endocrine disorder 03/26/2022   Vitamin B12 deficiency 03/26/2022   At risk for impaired metabolic function 03/26/2022   Class 3 severe obesity with serious comorbidity and body mass index (BMI) of 50.0 to 59.9 in adult 03/26/2022   Hyperlipidemia associated with type 2 diabetes mellitus (HCC) 03/26/2022   Hypertension associated with diabetes (HCC) 03/26/2022   At risk for heart disease 03/26/2022   Chronic pain 01/18/2022   Panniculitis 07/30/2021   HNP (herniated nucleus pulposus), cervical 06/19/2021   Cervical disc herniation 06/19/2021   Cervical radiculopathy 04/05/2021   Abdominal pain, generalized  04/05/2021   Constipation 04/05/2021   Right knee pain 10/08/2020   Scratch of right lower leg 10/08/2020   History of COVID-19 09/07/2020   Intermittent chest pain 09/07/2020   Pneumonia due to COVID-19 virus 08/13/2020   Viral illness 07/30/2020   Vitamin D  deficiency 01/29/2020   Grief reaction 01/27/2020    Chronic right sacroiliac joint pain 09/05/2019   Degenerative arthritis of right knee 09/05/2019   Generalized anxiety disorder 03/08/2019   Hearing loss 03/08/2019   Neuropathy 01/26/2019   Right hip pain 04/14/2018   COPD exacerbation (HCC) 04/16/2017   Metatarsal stress fracture of left foot 12/08/2016   Stress fracture of left foot 12/08/2016   Dysuria 11/04/2016   Low serum cortisol level 10/21/2016   Leukocytosis 10/15/2016   Hyponatremia 10/14/2016   AKI (acute kidney injury) (HCC) 10/14/2016   Nausea 10/09/2016   Leg cramps 10/09/2016   Community acquired pneumonia 09/28/2016   Chronic diastolic congestive heart failure (HCC) 09/28/2016   Asthma exacerbation 12/20/2015   Type 2 diabetes mellitus with hyperglycemia, without long-term current use of insulin  (HCC) 11/19/2015   Dyspnea 10/09/2015   Chronic respiratory failure with hypoxia and hypercapnia (HCC) 07/19/2015   COPD GOLD 0 / AB 07/18/2015   Vaginitis 05/31/2015   Thoracic outlet syndrome 01/29/2015   Ulnar neuropathy at elbow of right upper extremity 12/19/2014   Benign essential tremor 10/13/2014   Movement disorder 08/30/2014   Cough 08/30/2014   Rash and nonspecific skin eruption 03/07/2014   Skin lesion of back 03/07/2014   PCOS (polycystic ovarian syndrome) 10/31/2013   History of CVA (cerebrovascular accident) 08/11/2013   Asthmatic bronchitis 06/10/2013   Acute meniscal tear of right knee 06/06/2013   Sprain of MCL (medial collateral ligament) of knee 05/03/2013   Left arm pain 02/09/2013   Hypoxemia 07/19/2012   Venous insufficiency 07/19/2012   Morbid obesity (HCC) 07/19/2012   Current every day smoker 04/11/2011   Encounter for well adult exam with abnormal findings 04/06/2011   SLEEP RELATED HYPOVENTILATION/HYPOXEMIA CCE 02/08/2009   Polycythemia 01/29/2009   ANXIETY DEPRESSION 01/29/2009   Hypothyroidism 11/19/2007   HLD (hyperlipidemia) 11/19/2007   Depression 11/19/2007   Essential  hypertension 11/19/2007    Allergies:  Allergies  Allergen Reactions   Cymbalta  [Duloxetine  Hcl]     Altered mental state / agitated    Medications:  Current Outpatient Medications:    predniSONE  (DELTASONE ) 10 MG tablet, Take 1 tablet (10 mg total) by mouth daily with breakfast. 3 tabs by mouth per day for 3 days,2 tabs per day for 3 days,1 tab per day for 3 days, Disp: 18 tablet, Rfl: 0   ACCU-CHEK GUIDE TEST test strip, USE AS INSTRUCTED TO CHECK BLOOD SUGAR TWICE DAILY, Disp: 200 strip, Rfl: 2   Accu-Chek Softclix Lancets lancets, USE AS INSTRUCTED TO CHECK BLOOD SUGAR ONCE DAILY, Disp: 100 each, Rfl: 2   albuterol  (PROVENTIL ) (2.5 MG/3ML) 0.083% nebulizer solution, Take 3 mLs (2.5 mg total) by nebulization every 6 (six) hours as needed for wheezing or shortness of breath., Disp: 75 mL, Rfl: 0   albuterol  (VENTOLIN  HFA) 108 (90 Base) MCG/ACT inhaler, USE 2 INHALATIONS BY MOUTH EVERY 6 HOURS AS NEEDED, Disp: 34 g, Rfl: 2   amoxicillin -clavulanate (AUGMENTIN ) 875-125 MG tablet, Take 1 tablet by mouth 2 (two) times daily., Disp: 14 tablet, Rfl: 0   aspirin  EC 81 MG tablet, Take 81 mg by mouth daily., Disp: , Rfl:    Azelastine -Fluticasone  137-50 MCG/ACT SUSP, Place 1 spray into  the nose every 12 (twelve) hours., Disp: 23 g, Rfl: 0   bisoprolol -hydrochlorothiazide  (ZIAC ) 5-6.25 MG tablet, TAKE 1 TABLET BY MOUTH DAILY, Disp: 100 tablet, Rfl: 2   Blood Glucose Monitoring Suppl (ACCU-CHEK GUIDE) w/Device KIT, Use as instructed to check blood sugar 2X daily, Disp: 1 kit, Rfl: 0   BREO ELLIPTA  100-25 MCG/ACT AEPB, USE 1 INHALATION BY MOUTH ONCE  DAILY AT THE SAME TIME EACH DAY, Disp: 180 each, Rfl: 3   cholecalciferol  (VITAMIN D3) 25 MCG (1000 UNIT) tablet, Take 1,000 Units by mouth daily., Disp: , Rfl:    clonazePAM  (KLONOPIN ) 0.5 MG tablet, Take 1 tablet (0.5 mg total) by mouth 2 (two) times daily as needed for anxiety., Disp: 60 tablet, Rfl: 5   cyclobenzaprine  (FLEXERIL ) 5 MG tablet, TAKE 1  TABLET BY MOUTH 3  TIMES DAILY AS NEEDED FOR  MUSCLE SPASM(S), Disp: 270 tablet, Rfl: 1   Diclofenac  Sodium (PENNSAID ) 2 % SOLN, Place 2 g onto the skin 2 (two) times daily., Disp: 112 g, Rfl: 3   empagliflozin  (JARDIANCE ) 25 MG TABS tablet, Take 1 tablet (25 mg total) by mouth daily before breakfast., Disp: 30 tablet, Rfl: 5   FLUoxetine  (PROZAC ) 40 MG capsule, TAKE 1 CAPSULE BY MOUTH DAILY, Disp: 100 capsule, Rfl: 2   furosemide  (LASIX ) 40 MG tablet, TAKE 1 TABLET BY MOUTH DAILY, Disp: 90 tablet, Rfl: 1   Gabapentin  Enacarbil (HORIZANT ) 600 MG TBCR, Take 1 tablet (600 mg total) by mouth at bedtime., Disp: 90 tablet, Rfl: 3   Lancets Misc. (ACCU-CHEK FASTCLIX LANCET) KIT, Use to check blood sugar once a day, Disp: 1 kit, Rfl: 0   levothyroxine  (SYNTHROID ) 200 MCG tablet, TAKE 1 TABLET BY MOUTH DAILY, Disp: 100 tablet, Rfl: 2   levothyroxine  (SYNTHROID ) 25 MCG tablet, TAKE 1 TABLET BY MOUTH DAILY  BEFORE BREAKFAST WITH 200 MCG  TABLET FOR A TOTAL DAILY DOSE OF 225 MCG, Disp: 100 tablet, Rfl: 2   metFORMIN  (GLUCOPHAGE -XR) 500 MG 24 hr tablet, TAKE 1 TABLET BY MOUTH DAILY  WITH BREAKFAST, Disp: 100 tablet, Rfl: 2   montelukast  (SINGULAIR ) 10 MG tablet, TAKE 1 TABLET BY MOUTH AT  BEDTIME, Disp: 100 tablet, Rfl: 2   Multiple Vitamin (MULTIVITAMIN WITH MINERALS) TABS tablet, Take 1 tablet by mouth daily., Disp: , Rfl:    ondansetron  (ZOFRAN  ODT) 4 MG disintegrating tablet, Take 1 tablet (4 mg total) by mouth every 8 (eight) hours as needed for nausea or vomiting., Disp: 4 tablet, Rfl: 0   oxyCODONE  (OXY IR/ROXICODONE ) 5 MG immediate release tablet, Take 5 mg by mouth every 6 (six) hours as needed., Disp: , Rfl:    OXYGEN , 2lpm with sleep only  [facility], Disp: , Rfl:    primidone  (MYSOLINE ) 50 MG tablet, TAKE 1 TABLET BY MOUTH IN THE  MORNING AND 3 TABLETS BY MOUTH  AT BEDTIME, Disp: 360 tablet, Rfl: 3   rosuvastatin  (CRESTOR ) 40 MG tablet, TAKE 1 TABLET BY MOUTH DAILY, Disp: 100 tablet, Rfl: 2    Semaglutide ,0.25 or 0.5MG /DOS, (OZEMPIC , 0.25 OR 0.5 MG/DOSE,) 2 MG/3ML SOPN, 0.25 once wkly * 2 wks, then inc to 0.5 wkly, Disp: , Rfl:    spironolactone  (ALDACTONE ) 25 MG tablet, TAKE 1 TABLET BY MOUTH TWICE  DAILY, Disp: 200 tablet, Rfl: 2   tirzepatide (MOUNJARO) 2.5 MG/0.5ML Pen, Inject 2.5 mg into the skin once a week., Disp: 2 mL, Rfl: 1  Observations/Objective: Patient is well-developed, well-nourished in no acute distress.  Resting comfortably at home.  Head  is normocephalic, atraumatic.  No labored breathing.  Speech is clear and coherent with logical content.  Patient is alert and oriented at baseline.    Assessment and Plan: 1. Mild persistent asthmatic bronchitis with acute exacerbation (Primary) - albuterol  (PROVENTIL ) (2.5 MG/3ML) 0.083% nebulizer solution; Take 3 mLs (2.5 mg total) by nebulization every 6 (six) hours as needed for wheezing or shortness of breath.  Dispense: 75 mL; Refill: 0 - predniSONE  (DELTASONE ) 10 MG tablet; Take 1 tablet (10 mg total) by mouth daily with breakfast. 3 tabs by mouth per day for 3 days,2 tabs per day for 3 days,1 tab per day for 3 days  Dispense: 18 tablet; Refill: 0  - Continue Augmentin  - Prednisone  taper added - Nebulizer medication refilled - Can continue to use inhalers as prescribed and needed - Steam and Humidifier can help - Push fluids - Rest as needed - Seek in person evaluation if symptoms worsen or fail to improve  Follow Up Instructions: I discussed the assessment and treatment plan with the patient. The patient was provided an opportunity to ask questions and all were answered. The patient agreed with the plan and demonstrated an understanding of the instructions.  A copy of instructions were sent to the patient via MyChart unless otherwise noted below.    The patient was advised to call back or seek an in-person evaluation if the symptoms worsen or if the condition fails to improve as anticipated.    Angelia Kelp, PA-C

## 2023-11-23 NOTE — Patient Instructions (Signed)
 Narda Bacon, thank you for joining Angelia Kelp, PA-C for today's virtual visit.  While this provider is not your primary care provider (PCP), if your PCP is located in our provider database this encounter information will be shared with them immediately following your visit.   A [facility] MyChart account gives you access to today's visit and all your visits, tests, and labs performed at [facility] " click here if you don't have a [facility] MyChart account or go to [email or web address]  Consent: (Patient) Autumn Massey provided verbal consent for this virtual visit at the beginning of the encounter.  Current Medications:  Current Outpatient Medications:    predniSONE  (DELTASONE ) 10 MG tablet, Take 1 tablet (10 mg total) by mouth daily with breakfast. 3 tabs by mouth per day for 3 days,2 tabs per day for 3 days,1 tab per day for 3 days, Disp: 18 tablet, Rfl: 0   ACCU-CHEK GUIDE TEST test strip, USE AS INSTRUCTED TO CHECK BLOOD SUGAR TWICE DAILY, Disp: 200 strip, Rfl: 2   Accu-Chek Softclix Lancets lancets, USE AS INSTRUCTED TO CHECK BLOOD SUGAR ONCE DAILY, Disp: 100 each, Rfl: 2   albuterol  (PROVENTIL ) (2.5 MG/3ML) 0.083% nebulizer solution, Take 3 mLs (2.5 mg total) by nebulization every 6 (six) hours as needed for wheezing or shortness of breath., Disp: 75 mL, Rfl: 0   albuterol  (VENTOLIN  HFA) 108 (90 Base) MCG/ACT inhaler, USE 2 INHALATIONS BY MOUTH EVERY 6 HOURS AS NEEDED, Disp: 34 g, Rfl: 2   amoxicillin -clavulanate (AUGMENTIN ) 875-125 MG tablet, Take 1 tablet by mouth 2 (two) times daily., Disp: 14 tablet, Rfl: 0   aspirin  EC 81 MG tablet, Take 81 mg by mouth daily., Disp: , Rfl:    Azelastine -Fluticasone  137-50 MCG/ACT SUSP, Place 1 spray into the nose every 12 (twelve) hours., Disp: 23 g, Rfl: 0   bisoprolol -hydrochlorothiazide  (ZIAC ) 5-6.25 MG tablet, TAKE 1 TABLET BY MOUTH DAILY, Disp: 100 tablet, Rfl: 2   Blood Glucose Monitoring Suppl (ACCU-CHEK  GUIDE) w/Device KIT, Use as instructed to check blood sugar 2X daily, Disp: 1 kit, Rfl: 0   BREO ELLIPTA  100-25 MCG/ACT AEPB, USE 1 INHALATION BY MOUTH ONCE  DAILY AT THE SAME TIME EACH DAY, Disp: 180 each, Rfl: 3   cholecalciferol  (VITAMIN D3) 25 MCG (1000 UNIT) tablet, Take 1,000 Units by mouth daily., Disp: , Rfl:    clonazePAM  (KLONOPIN ) 0.5 MG tablet, Take 1 tablet (0.5 mg total) by mouth 2 (two) times daily as needed for anxiety., Disp: 60 tablet, Rfl: 5   cyclobenzaprine  (FLEXERIL ) 5 MG tablet, TAKE 1 TABLET BY MOUTH 3  TIMES DAILY AS NEEDED FOR  MUSCLE SPASM(S), Disp: 270 tablet, Rfl: 1   Diclofenac  Sodium (PENNSAID ) 2 % SOLN, Place 2 g onto the skin 2 (two) times daily., Disp: 112 g, Rfl: 3   empagliflozin  (JARDIANCE ) 25 MG TABS tablet, Take 1 tablet (25 mg total) by mouth daily before breakfast., Disp: 30 tablet, Rfl: 5   FLUoxetine  (PROZAC ) 40 MG capsule, TAKE 1 CAPSULE BY MOUTH DAILY, Disp: 100 capsule, Rfl: 2   furosemide  (LASIX ) 40 MG tablet, TAKE 1 TABLET BY MOUTH DAILY, Disp: 90 tablet, Rfl: 1   Gabapentin  Enacarbil (HORIZANT ) 600 MG TBCR, Take 1 tablet (600 mg total) by mouth at bedtime., Disp: 90 tablet, Rfl: 3   Lancets Misc. (ACCU-CHEK FASTCLIX LANCET) KIT, Use to check blood sugar once a day, Disp: 1 kit, Rfl: 0   levothyroxine  (SYNTHROID ) 200 MCG tablet, TAKE 1 TABLET BY MOUTH  DAILY, Disp: 100 tablet, Rfl: 2   levothyroxine  (SYNTHROID ) 25 MCG tablet, TAKE 1 TABLET BY MOUTH DAILY  BEFORE BREAKFAST WITH 200 MCG  TABLET FOR A TOTAL DAILY DOSE OF 225 MCG, Disp: 100 tablet, Rfl: 2   metFORMIN  (GLUCOPHAGE -XR) 500 MG 24 hr tablet, TAKE 1 TABLET BY MOUTH DAILY  WITH BREAKFAST, Disp: 100 tablet, Rfl: 2   montelukast  (SINGULAIR ) 10 MG tablet, TAKE 1 TABLET BY MOUTH AT  BEDTIME, Disp: 100 tablet, Rfl: 2   Multiple Vitamin (MULTIVITAMIN WITH MINERALS) TABS tablet, Take 1 tablet by mouth daily., Disp: , Rfl:    ondansetron  (ZOFRAN  ODT) 4 MG disintegrating tablet, Take 1 tablet (4 mg total)  by mouth every 8 (eight) hours as needed for nausea or vomiting., Disp: 4 tablet, Rfl: 0   oxyCODONE  (OXY IR/ROXICODONE ) 5 MG immediate release tablet, Take 5 mg by mouth every 6 (six) hours as needed., Disp: , Rfl:    OXYGEN , 2lpm with sleep only  [facility], Disp: , Rfl:    primidone  (MYSOLINE ) 50 MG tablet, TAKE 1 TABLET BY MOUTH IN THE  MORNING AND 3 TABLETS BY MOUTH  AT BEDTIME, Disp: 360 tablet, Rfl: 3   rosuvastatin  (CRESTOR ) 40 MG tablet, TAKE 1 TABLET BY MOUTH DAILY, Disp: 100 tablet, Rfl: 2   Semaglutide ,0.25 or 0.5MG /DOS, (OZEMPIC , 0.25 OR 0.5 MG/DOSE,) 2 MG/3ML SOPN, 0.25 once wkly * 2 wks, then inc to 0.5 wkly, Disp: , Rfl:    spironolactone  (ALDACTONE ) 25 MG tablet, TAKE 1 TABLET BY MOUTH TWICE  DAILY, Disp: 200 tablet, Rfl: 2   tirzepatide (MOUNJARO) 2.5 MG/0.5ML Pen, Inject 2.5 mg into the skin once a week., Disp: 2 mL, Rfl: 1   Medications ordered in this encounter:  Meds ordered this encounter  Medications   albuterol  (PROVENTIL ) (2.5 MG/3ML) 0.083% nebulizer solution    Sig: Take 3 mLs (2.5 mg total) by nebulization every 6 (six) hours as needed for wheezing or shortness of breath.    Dispense:  75 mL    Refill:  0    Supervising Provider:   Corine Dice [id number]   predniSONE  (DELTASONE ) 10 MG tablet    Sig: Take 1 tablet (10 mg total) by mouth daily with breakfast. 3 tabs by mouth per day for 3 days,2 tabs per day for 3 days,1 tab per day for 3 days    Dispense:  18 tablet    Refill:  0    Supervising Provider:   Corine Dice [phone]     *If you need refills on other medications prior to your next appointment, please contact your pharmacy*  Follow-Up: Call back or seek an in-person evaluation if the symptoms worsen or if the condition fails to improve as anticipated.  [facility] [phone]  Other Instructions Acute Bronchitis, Adult  Acute bronchitis is sudden inflammation of the main airways (bronchi) that come off the windpipe (trachea)  in the lungs. The swelling causes the airways to get smaller and make more mucus than normal. This can make it hard to breathe and can cause coughing or noisy breathing (wheezing). Acute bronchitis may last several weeks. The cough may last longer. Allergies, asthma, and exposure to smoke may make the condition worse. What are the causes? This condition can be caused by germs and by substances that irritate the lungs, including: Cold and flu viruses. The most common cause of this condition is the virus that causes the common cold. Bacteria. This is less common. Breathing in substances  that irritate the lungs, including: Smoke from cigarettes and other forms of tobacco. Dust and pollen. Fumes from household cleaning products, gases, or burned fuel. Indoor or outdoor air pollution. What increases the risk? The following factors may make you more likely to develop this condition: A weak body's defense system, also called the immune system. A condition that affects your lungs and breathing, such as asthma. What are the signs or symptoms? Common symptoms of this condition include: Coughing. This may bring up clear, yellow, or green mucus from your lungs (sputum). Wheezing. Runny or stuffy nose. Having too much mucus in your lungs (chest congestion). Shortness of breath. Aches and pains, including sore throat or chest. How is this diagnosed? This condition is usually diagnosed based on: Your symptoms and medical history. A physical exam. You may also have other tests, including tests to rule out other conditions, such as pneumonia. These tests include: A test of lung function. Test of a mucus sample to look for the presence of bacteria. Tests to check the oxygen  level in your blood. Blood tests. Chest X-ray. How is this treated? Most cases of acute bronchitis clear up over time without treatment. Your health care provider may recommend: Drinking more fluids to help thin your mucus so it  is easier to cough up. Taking inhaled medicine (inhaler) to improve air flow in and out of your lungs. Using a vaporizer or a humidifier. These are machines that add water to the air to help you breathe better. Taking a medicine that thins mucus and clears congestion (expectorant). Taking a medicine that prevents or stops coughing (cough suppressant). It is not common to take an antibiotic medicine for this condition. Follow these instructions at home:  Take over-the-counter and prescription medicines only as told by your health care provider. Use an inhaler, vaporizer, or humidifier as told by your health care provider. Take two teaspoons (10 mL) of honey at bedtime to lessen coughing at night. Drink enough fluid to keep your urine pale yellow. Do not use any products that contain nicotine or tobacco. These products include cigarettes, chewing tobacco, and vaping devices, such as e-cigarettes. If you need help quitting, ask your health care provider. Get plenty of rest. Return to your normal activities as told by your health care provider. Ask your health care provider what activities are safe for you. Keep all follow-up visits. This is important. How is this prevented? To lower your risk of getting this condition again: Wash your hands often with soap and water for at least 20 seconds. If soap and water are not available, use hand sanitizer. Avoid contact with people who have cold symptoms. Try not to touch your mouth, nose, or eyes with your hands. Avoid breathing in smoke or chemical fumes. Breathing smoke or chemical fumes will make your condition worse. Get the flu shot every year. Contact a health care provider if: Your symptoms do not improve after 2 weeks. You have trouble coughing up the mucus. Your cough keeps you awake at night. You have a fever. Get help right away if you: Cough up blood. Feel pain in your chest. Have severe shortness of breath. Faint or keep feeling like  you are going to faint. Have a severe headache. Have a fever or chills that get worse. These symptoms may represent a serious problem that is an emergency. Do not wait to see if the symptoms will go away. Get medical help right away. Call your local emergency services (911 in the [place] Do  not drive yourself to the hospital. Summary Acute bronchitis is inflammation of the main airways (bronchi) that come off the windpipe (trachea) in the lungs. The swelling causes the airways to get smaller and make more mucus than normal. Drinking more fluids can help thin your mucus so it is easier to cough up. Take over-the-counter and prescription medicines only as told by your health care provider. Do not use any products that contain nicotine or tobacco. These products include cigarettes, chewing tobacco, and vaping devices, such as e-cigarettes. If you need help quitting, ask your health care provider. Contact a health care provider if your symptoms do not improve after 2 weeks. This information is not intended to replace advice given to you by your health care provider. Make sure you discuss any questions you have with your health care provider. Document Revised: 09/26/2021 Document Reviewed: 10/17/2020 Elsevier Patient Education  2024 Elsevier Inc.   If you have been instructed to have an in-person evaluation today at a local Urgent Care facility, please use the link below. It will take you to a list of all of our available [facility], including address, phone number and hours of operation. Please do not delay care.  [facility]  If you or a family member do not have a primary care provider, use the link below to schedule a visit and establish care. When you choose a [facility] primary care physician or advanced practice provider, you gain a long-term partner in health. Find a Primary Care Provider  Learn more about [facility]'s in-office and virtual care options: [facility] - Get Care Now

## 2023-11-24 MED ORDER — ALBUTEROL SULFATE (2.5 MG/3ML) 0.083% IN NEBU: 2.5000 mg | INHALATION_SOLUTION | Freq: Four times a day (QID) | RESPIRATORY_TRACT | 1 refills | Status: AC | PRN

## 2023-11-28 ENCOUNTER — Emergency Department (HOSPITAL_COMMUNITY)
Admission: EM | Admit: 2023-11-28 | Discharge: 2023-11-28 | Disposition: A | Discharge status: Home / Self Care | Attending: Emergency Medicine | Admitting: Emergency Medicine

## 2023-11-28 ENCOUNTER — Encounter (HOSPITAL_COMMUNITY): Payer: Self-pay

## 2023-11-28 ENCOUNTER — Other Ambulatory Visit: Payer: Self-pay

## 2023-11-28 DIAGNOSIS — R197 Diarrhea, unspecified: Secondary | ICD-10-CM | POA: Insufficient documentation

## 2023-11-28 DIAGNOSIS — Z794 Long term (current) use of insulin: Secondary | ICD-10-CM | POA: Insufficient documentation

## 2023-11-28 DIAGNOSIS — D72829 Elevated white blood cell count, unspecified: Secondary | ICD-10-CM | POA: Insufficient documentation

## 2023-11-28 DIAGNOSIS — Z7982 Long term (current) use of aspirin: Secondary | ICD-10-CM | POA: Diagnosis not present

## 2023-11-28 DIAGNOSIS — R112 Nausea with vomiting, unspecified: Secondary | ICD-10-CM | POA: Insufficient documentation

## 2023-11-28 LAB — COMPREHENSIVE METABOLIC PANEL WITH GFR
ALT: 21 U/L (ref 0–44)
AST: 17 U/L (ref 15–41)
Albumin: 2.8 g/dL — ABNORMAL LOW (ref 3.5–5.0)
Alkaline Phosphatase: 47 U/L (ref 38–126)
Anion gap: 8 (ref 5–15)
BUN: 16 mg/dL (ref 6–20)
CO2: 27 mmol/L (ref 22–32)
Calcium: 8.5 mg/dL — ABNORMAL LOW (ref 8.9–10.3)
Chloride: 102 mmol/L (ref 98–111)
Creatinine, Ser: 0.58 mg/dL (ref 0.44–1.00)
GFR, Estimated: >60 mL/min (ref >60)
Glucose, Bld: 101 mg/dL — ABNORMAL HIGH (ref 70–99)
Potassium: 3.6 mmol/L (ref 3.5–5.1)
Sodium: 137 mmol/L (ref 135–145)
Total Bilirubin: 0.9 mg/dL (ref 0.0–1.2)
Total Protein: 6 g/dL — ABNORMAL LOW (ref 6.5–8.1)

## 2023-11-28 LAB — CBC WITH DIFFERENTIAL/PLATELET
Abs Immature Granulocytes: 0.15 10*3/uL — ABNORMAL HIGH (ref 0.00–0.07)
Basophils Absolute: 0.1 10*3/uL (ref 0.0–0.1)
Basophils Relative: 0 %
Eosinophils Absolute: 0.2 10*3/uL (ref 0.0–0.5)
Eosinophils Relative: 1 %
HCT: 54.2 % — ABNORMAL HIGH (ref 36.0–46.0)
Hemoglobin: 17.7 g/dL — ABNORMAL HIGH (ref 12.0–15.0)
Immature Granulocytes: 1 %
Lymphocytes Relative: 16 %
Lymphs Abs: 2.2 10*3/uL (ref 0.7–4.0)
MCH: 31.2 pg (ref 26.0–34.0)
MCHC: 32.7 g/dL (ref 30.0–36.0)
MCV: 95.6 fL (ref 80.0–100.0)
Monocytes Absolute: 0.9 10*3/uL (ref 0.1–1.0)
Monocytes Relative: 6 %
Neutro Abs: 10.2 10*3/uL — ABNORMAL HIGH (ref 1.7–7.7)
Neutrophils Relative %: 76 %
Platelets: 189 10*3/uL (ref 150–400)
RBC: 5.67 MIL/uL — ABNORMAL HIGH (ref 3.87–5.11)
RDW: 13.3 % (ref 11.5–15.5)
WBC: 13.7 10*3/uL — ABNORMAL HIGH (ref 4.0–10.5)
nRBC: 0 % (ref 0.0–0.2)

## 2023-11-28 LAB — URINALYSIS, ROUTINE W REFLEX MICROSCOPIC
Bacteria, UA: NONE SEEN
Bilirubin Urine: NEGATIVE
Glucose, UA: >=500 mg/dL — AB
Hgb urine dipstick: NEGATIVE
Ketones, ur: NEGATIVE mg/dL
Leukocytes,Ua: NEGATIVE
Nitrite: NEGATIVE
Protein, ur: 30 mg/dL — AB
Specific Gravity, Urine: 1.03 (ref 1.005–1.030)
pH: 5 (ref 5.0–8.0)

## 2023-11-28 LAB — HCG, SERUM, QUALITATIVE: Preg, Serum: NEGATIVE

## 2023-11-28 LAB — LIPASE, BLOOD: Lipase: 38 U/L (ref 11–51)

## 2023-11-28 MED ORDER — MORPHINE SULFATE (PF) 4 MG/ML IV SOLN
4.0000 mg | Freq: Once | INTRAVENOUS | Status: AC
  Administered 2023-11-28: 4 mg via INTRAVENOUS
  Filled 2023-11-28: qty 1

## 2023-11-28 MED ORDER — ONDANSETRON HCL 4 MG/2ML IJ SOLN
4.0000 mg | Freq: Once | INTRAMUSCULAR | Status: AC
  Administered 2023-11-28: 4 mg via INTRAVENOUS
  Filled 2023-11-28: qty 2

## 2023-11-28 MED ORDER — ONDANSETRON HCL 4 MG/2ML IJ SOLN
INTRAMUSCULAR | Status: AC
  Filled 2023-11-28: qty 2

## 2023-11-28 MED ORDER — ONDANSETRON 4 MG PO TBDP: ORAL_TABLET | ORAL | 0 refills | Status: AC

## 2023-11-28 MED ORDER — SODIUM CHLORIDE 0.9 % IV BOLUS
1000.0000 mL | Freq: Once | INTRAVENOUS | Status: AC
  Administered 2023-11-28: 1000 mL via INTRAVENOUS

## 2023-11-28 NOTE — ED Provider Notes (Signed)
 Autumn Massey   CSN: [id number] Arrival date & time: 11/28/23  8657     History  Chief Complaint  Patient presents with   Abdominal Pain    Autumn Massey is a 58 y.o. female.  58 yo F with a chief complaints of nausea vomiting and diarrhea.  Started last night.  Persisted throughout the evening.  She feels like she cannot eat or drink anything and anything that goes into her mouth makes her abdomen cramp.  She has not noticed any obvious fevers.  Has been sick for the past couple weeks with cough and congestion currently is on antibiotics.   Abdominal Pain      Home Medications Prior to Admission medications   Medication Sig Start Date End Date Taking? Authorizing Provider  ondansetron  (ZOFRAN -ODT) 4 MG disintegrating tablet 4mg  ODT q4 hours prn nausea/vomit 11/28/23  Yes Albertus Hughs, DO  ACCU-CHEK GUIDE TEST test strip USE AS INSTRUCTED TO CHECK BLOOD SUGAR TWICE DAILY 10/12/23   Emilie Harden, MD  Accu-Chek Softclix Lancets lancets USE AS INSTRUCTED TO CHECK BLOOD SUGAR ONCE DAILY 12/25/22   Emilie Harden, MD  albuterol  (PROVENTIL ) (2.5 MG/3ML) 0.083% nebulizer solution Take 3 mLs (2.5 mg total) by nebulization every 6 (six) hours as needed for wheezing or shortness of breath. 11/24/23   Roslyn Coombe, MD  albuterol  (VENTOLIN  HFA) 108 (90 Base) MCG/ACT inhaler USE 2 INHALATIONS BY MOUTH EVERY 6 HOURS AS NEEDED 04/14/23   Roslyn Coombe, MD  amoxicillin -clavulanate (AUGMENTIN ) 875-125 MG tablet Take 1 tablet by mouth 2 (two) times daily. 11/19/23   Farris Hong, PA-C  aspirin  EC 81 MG tablet Take 81 mg by mouth daily.    [provider]  Azelastine -Fluticasone  137-50 MCG/ACT SUSP Place 1 spray into the nose every 12 (twelve) hours. 10/06/22   Zorita Hiss, FNP  bisoprolol -hydrochlorothiazide  (ZIAC ) 5-6.25 MG tablet TAKE 1 TABLET BY MOUTH DAILY 04/14/23   Roslyn Coombe, MD  Blood Glucose Monitoring  Suppl (ACCU-CHEK GUIDE) w/Device KIT Use as instructed to check blood sugar 2X daily 10/28/21   Emilie Harden, MD  BREO ELLIPTA  100-25 MCG/ACT AEPB USE 1 INHALATION BY MOUTH ONCE  DAILY AT THE SAME TIME EACH DAY 12/25/22   Roslyn Coombe, MD  cholecalciferol  (VITAMIN D3) 25 MCG (1000 UNIT) tablet Take 1,000 Units by mouth daily.    [provider]  clonazePAM  (KLONOPIN ) 0.5 MG tablet Take 1 tablet (0.5 mg total) by mouth 2 (two) times daily as needed for anxiety. 05/20/22   Roslyn Coombe, MD  cyclobenzaprine  (FLEXERIL ) 5 MG tablet TAKE 1 TABLET BY MOUTH 3  TIMES DAILY AS NEEDED FOR  MUSCLE SPASM(S) 01/27/20   Roslyn Coombe, MD  Diclofenac  Sodium (PENNSAID ) 2 % SOLN Place 2 g onto the skin 2 (two) times daily. 10/03/19   Isidro Margo, DO  empagliflozin  (JARDIANCE ) 25 MG TABS tablet Take 1 tablet (25 mg total) by mouth daily before breakfast. 11/11/23   Emilie Harden, MD  FLUoxetine  (PROZAC ) 40 MG capsule TAKE 1 CAPSULE BY MOUTH DAILY 10/12/23   Roslyn Coombe, MD  furosemide  (LASIX ) 40 MG tablet TAKE 1 TABLET BY MOUTH DAILY 09/19/20   O'Neal, Cathay Clonts, MD  Gabapentin  Enacarbil (HORIZANT ) 600 MG TBCR Take 1 tablet (600 mg total) by mouth at bedtime. 11/07/22   Merriam Abbey, DO  Lancets Misc. (ACCU-CHEK FASTCLIX LANCET) KIT Use to check blood sugar once a day 11/12/18  Emilie Harden, MD  levothyroxine  (SYNTHROID ) 200 MCG tablet TAKE 1 TABLET BY MOUTH DAILY 12/25/22   Emilie Harden, MD  levothyroxine  (SYNTHROID ) 25 MCG tablet TAKE 1 TABLET BY MOUTH DAILY  BEFORE BREAKFAST WITH 200 MCG  TABLET FOR A TOTAL DAILY DOSE OF 225 MCG 12/25/22   Emilie Harden, MD  metFORMIN  (GLUCOPHAGE -XR) 500 MG 24 hr tablet TAKE 1 TABLET BY MOUTH DAILY  WITH BREAKFAST 07/02/23   Emilie Harden, MD  montelukast  (SINGULAIR ) 10 MG tablet TAKE 1 TABLET BY MOUTH AT  BEDTIME 10/12/23   Roslyn Coombe, MD  Multiple Vitamin (MULTIVITAMIN WITH MINERALS) TABS tablet Take 1 tablet by mouth daily.    [provider]  oxyCODONE  (OXY IR/ROXICODONE ) 5 MG immediate release tablet Take 5 mg by mouth every 6 (six) hours as needed. 04/20/23   [provider]  OXYGEN  2lpm with sleep only  [facility]    [provider]  predniSONE  (DELTASONE ) 10 MG tablet Take 1 tablet (10 mg total) by mouth daily with breakfast. 3 tabs by mouth per day for 3 days,2 tabs per day for 3 days,1 tab per day for 3 days 11/23/23   Angelia Kelp, PA-C  primidone  (MYSOLINE ) 50 MG tablet TAKE 1 TABLET BY MOUTH IN THE  MORNING AND 3 TABLETS BY MOUTH  AT BEDTIME 11/07/22   Festus Hubert, Adam R, DO  rosuvastatin  (CRESTOR ) 40 MG tablet TAKE 1 TABLET BY MOUTH DAILY 10/12/23   Roslyn Coombe, MD  Semaglutide ,0.25 or 0.5MG /DOS, (OZEMPIC , 0.25 OR 0.5 MG/DOSE,) 2 MG/3ML SOPN 0.25 once wkly * 2 wks, then inc to 0.5 wkly 04/27/23   Opalski, Deborah, DO  spironolactone  (ALDACTONE ) 25 MG tablet TAKE 1 TABLET BY MOUTH TWICE  DAILY 10/12/23   Emilie Harden, MD  tirzepatide (MOUNJARO) 2.5 MG/0.5ML Pen Inject 2.5 mg into the skin once a week. 07/09/23   Emilie Harden, MD      Allergies    Cymbalta  [duloxetine  hcl]    Review of Systems   Review of Systems  Gastrointestinal:  Positive for abdominal pain.    Physical Exam Updated Vital Signs BP 137/65   Pulse 64   Temp 98.3 F (36.8 C) (Oral)   Resp 18   Ht 5\' 1"  (1.549 m)   Wt 128.4 kg   SpO2 94%   BMI 53.49 kg/m  Physical Exam Vitals and nursing Massey reviewed.  Constitutional:      General: She is not in acute distress.    Appearance: She is well-developed. She is not diaphoretic.  HENT:     Head: Normocephalic and atraumatic.  Eyes:     Pupils: Pupils are equal, round, and reactive to light.  Cardiovascular:     Rate and Rhythm: Normal rate and regular rhythm.     Heart sounds: No murmur heard.    No friction rub. No gallop.  Pulmonary:     Effort: Pulmonary effort is normal.     Breath sounds: No wheezing or rales.  Abdominal:     General: There is no  distension.     Palpations: Abdomen is soft.     Tenderness: There is no abdominal tenderness.  Musculoskeletal:        General: No tenderness.     Cervical back: Normal range of motion and neck supple.  Skin:    General: Skin is warm and dry.  Neurological:     Mental Status: She is alert and oriented to person, place, and time.  Psychiatric:  Behavior: Behavior normal.     ED Results / Procedures / Treatments   Labs (all labs ordered are listed, but only abnormal results are displayed) Labs Reviewed  CBC WITH DIFFERENTIAL/PLATELET - Abnormal; Notable for the following components:      Result Value   WBC 13.7 (*)    RBC 5.67 (*)    Hemoglobin 17.7 (*)    HCT 54.2 (*)    Neutro Abs 10.2 (*)    Abs Immature Granulocytes 0.15 (*)    All other components within normal limits  URINALYSIS, ROUTINE W REFLEX MICROSCOPIC - Abnormal; Notable for the following components:   Glucose, UA >=500 (*)    Protein, ur 30 (*)    All other components within normal limits  COMPREHENSIVE METABOLIC PANEL WITH GFR - Abnormal; Notable for the following components:   Glucose, Bld 101 (*)    Calcium  8.5 (*)    Total Protein 6.0 (*)    Albumin  2.8 (*)    All other components within normal limits  HCG, SERUM, QUALITATIVE  LIPASE, BLOOD    EKG None  Radiology No results found.  Procedures Procedures    Medications Ordered in ED Medications  ondansetron  (ZOFRAN ) 4 MG/2ML injection (  Not Given 11/28/23 0813)  morphine  (PF) 4 MG/ML injection 4 mg (4 mg Intravenous Given 11/28/23 0807)  ondansetron  (ZOFRAN ) injection 4 mg (4 mg Intravenous Given 11/28/23 0806)  sodium chloride  0.9 % bolus 1,000 mL (0 mLs Intravenous Stopped 11/28/23 1209)    ED Course/ Medical Decision Making/ A&P                                 Medical Decision Making Amount and/or Complexity of Data Reviewed Labs: ordered.  Risk Prescription drug management.   58 yo F with a chief complaints of nausea  vomiting and diarrhea.  Started last night.  Likely viral by history.  Will attempt to control symptoms.  Screening blood work.  Reassess.  Patient is feeling a bit better on repeat assessment.  Mild leukocytosis.  Could be due to her steroid use with her upper respiratory illness.  LFTs and lipase are unremarkable.  She is feeling better on repeat assessment.  Tolerating by mouth.  Will discharge home.  PCP follow-up.  12:38 PM:  I have discussed the diagnosis/risks/treatment options with the patient.  Evaluation and diagnostic testing in the emergency department does not suggest an emergent condition requiring admission or immediate intervention beyond what has been performed at this time.  They will follow up with PCP. We also discussed returning to the ED immediately if new or worsening sx occur. We discussed the sx which are most concerning (e.g., sudden worsening pain, fever, inability to tolerate by mouth) that necessitate immediate return. Medications administered to the patient during their visit and any new prescriptions provided to the patient are listed below.  Medications given during this visit Medications  ondansetron  (ZOFRAN ) 4 MG/2ML injection (  Not Given 11/28/23 0813)  morphine  (PF) 4 MG/ML injection 4 mg (4 mg Intravenous Given 11/28/23 0807)  ondansetron  (ZOFRAN ) injection 4 mg (4 mg Intravenous Given 11/28/23 0806)  sodium chloride  0.9 % bolus 1,000 mL (0 mLs Intravenous Stopped 11/28/23 1209)     The patient appears reasonably screen and/or stabilized for discharge and I doubt any other medical condition or other [facility] requiring further screening, evaluation, or treatment in the ED at this time prior to discharge.  Final Clinical Impression(s) / ED Diagnoses Final diagnoses:  Nausea vomiting and diarrhea    Rx / DC Orders ED Discharge Orders          Ordered    ondansetron  (ZOFRAN -ODT) 4 MG disintegrating tablet        11/28/23 1205               Albertus Hughs, DO 11/28/23 1238

## 2023-11-28 NOTE — ED Triage Notes (Signed)
 Pt reports with sharp intermittent abdominal pain, diarrhea, and vomiting since last night. Pt reports recently having a sinus infection and congestion.

## 2023-11-28 NOTE — Discharge Instructions (Signed)
 Take tylenol  2 pills 4 times a day and motrin  4 pills 3 times a day.  Drink plenty of fluids.  Return for worsening shortness of breath, headache, confusion. Follow up with your family doctor.   You can take Imodium for diarrhea.  I prescribed you medication for nausea.  Please return for worsening pain fever inability eat or drink.

## 2023-12-09 ENCOUNTER — Telehealth: Payer: Self-pay | Admitting: *Deleted

## 2023-12-09 NOTE — Telephone Encounter (Signed)
 Team,  This pt's BMI is greater than 50; their procedure will need to be performed at the hospital.  Thanks,  Rogena Class

## 2023-12-09 NOTE — Telephone Encounter (Signed)
 It appears this patient is already scheduled for a hospital procedure;

## 2023-12-14 ENCOUNTER — Telehealth: Payer: Self-pay

## 2023-12-14 NOTE — Progress Notes (Signed)
 Anesthesia Review:  PCP: Windell Hasty- Video visit- 11/23/23  Cardiologist :  PPM/ ICD: Device Orders: Rep Notified:  Chest x-ray : EKG : Echo : 2022  Stress test: Cardiac Cath : CT Card-05/18/23    Activity level:  Sleep Study/ CPAP : Fasting Blood Sugar :      / Checks Blood Sugar -- times a day:     DM- type  Hgba1c-  Jardiance - Hold for 72 hours prior to procedure last dose on 12/17/23 Metformin - none am of procedure Mounjar Ozempic    Blood Thinner/ Instructions Audry Blinks Dose: ASA / Instructions/ Last Dose :    81 mg aspirin     11/28/23- cmp and cbc/diff  Oxygen  - 2L at has  11/28/23- In ED with N/V

## 2023-12-14 NOTE — Telephone Encounter (Addendum)
 Procedure:colon Procedure date: 6/23 Procedure location: [facility] Arrival Time: 615 am Spoke with the patient Y/N: Left [facility] on 6/16 at 4 pm to call back to confirm procedure Any prep concerns? .  Has the patient obtained the prep from the pharmacy ? Autumn Aas Do you have a care partner and transportation: . Any additional concerns? Autumn Massey

## 2023-12-15 ENCOUNTER — Encounter

## 2023-12-15 ENCOUNTER — Encounter: Payer: Self-pay | Admitting: Gastroenterology

## 2023-12-15 ENCOUNTER — Ambulatory Visit (AMBULATORY_SURGERY_CENTER): Admitting: *Deleted

## 2023-12-15 VITALS — Ht 61.0 in | Wt 278.0 lb

## 2023-12-15 DIAGNOSIS — Z800 Family history of malignant neoplasm of digestive organs: Secondary | ICD-10-CM

## 2023-12-15 DIAGNOSIS — Z83719 Family history of colon polyps, unspecified: Secondary | ICD-10-CM

## 2023-12-15 DIAGNOSIS — Z1211 Encounter for screening for malignant neoplasm of colon: Secondary | ICD-10-CM

## 2023-12-15 MED ORDER — NA SULFATE-K SULFATE-MG SULF 17.5-3.13-1.6 GM/177ML PO SOLN: 1.0000 | Freq: Once | ORAL | 0 refills | Status: AC

## 2023-12-15 NOTE — Progress Notes (Signed)
 Pt's name and DOB verified at the beginning of the pre-visit wit 2 identifiers  Pt denies any difficulty with ambulating,sitting, laying down or rolling side to side  Pt has no issues moving head neck or swallowing  No egg or soy allergy known to patient   No issues known to pt with past sedation with any surgeries or procedures  Patient denies ever being intubated  No FH of Malignant Hyperthermia  Pt is not on home 02   Pt is not on blood thinners   Pt denies issues with constipation   Pt has frequent issues with constipation RN instructed pt to use Miralax per bottles instructions a week before prep days. Pt states they will  Pt is not on dialysis  Pt denise any abnormal heart rhythms   Pt denies any upcoming cardiac testing  Patient's chart reviewed by Rogena Class CNRA prior to pre-visit and patient appropriate for the [facility].  Pre-visit completed and red dot placed by patient's name on their procedure day (on provider's schedule).    Visit by phone  Pt states weight is 278 lb   IInstructions reviewed. Pt given  both [facility] main # and MD on call # prior to instructions.  Pt states understanding of instructions. Instructed pt to review instructions again prior to procedure and call main # given if has questions.. Pt states they will.   Instructed pt on where to find instructions on My Chart.

## 2023-12-16 NOTE — Progress Notes (Signed)
Attempted to obtain medical history. Unable to reach pt. At this time. HIPAA complaint voicemail left with pre-surgical testing number. 

## 2023-12-17 NOTE — Progress Notes (Signed)
 Attempted to obtain medical history. Unable to reach pt. At this time. HIPAA complaint voicemail left with pre-surgical testing number. 2nd attempt to reach pt.

## 2023-12-18 ENCOUNTER — Encounter (HOSPITAL_COMMUNITY): Payer: Self-pay | Admitting: Gastroenterology

## 2023-12-18 ENCOUNTER — Other Ambulatory Visit: Payer: Self-pay

## 2023-12-18 NOTE — Progress Notes (Addendum)
 For Anesthesia: PCP - Roslyn Coombe, MD  Cardiologist - O'Neal, Cathay Clonts, MD   Bowel Prep reminder: Yes  Chest x-ray - CT Cardiac: 04/27/23 EKG - 03/11/22 Stress Test -  ECHO - 10/10/20 Cardiac Cath -  Pacemaker/ICD device last checked: Pacemaker orders received: Device Rep notified:  Spinal Cord Stimulator:N/A  Sleep Study - Yes CPAP - NO  Fasting Blood Sugar - 120's Checks Blood Sugar ___2__ times a day Date and result of last Hgb A1c-07/09/23: 7.3  Last dose of GLP1 agonist- semaglutide . Last dose: 12/04/23 GLP1 instructions:   Last dose of SGLT-2 inhibitors- Jardiance : Last dose: 12/17/23 SGLT-2 instructions:   Blood Thinner Instructions: Aspirin  Instructions:NONE Last Dose:  Activity level:      Unable to go up a flight of stairs without shortness of breath    Anesthesia review: Hx: CHF,HTN,DIA,OSA( 2L O2 @ night),SOB.  Patient denies shortness of breath, fever, cough and chest pain at PAT appointment   Patient verbalized understanding of instructions that were reviewed over the telephone.

## 2023-12-19 NOTE — Anesthesia Preprocedure Evaluation (Signed)
 Anesthesia Evaluation  Patient identified by MRN, date of birth, ID band Patient awake    Reviewed: Allergy & Precautions, NPO status , Patient's Chart, lab work & pertinent test results  History of Anesthesia Complications Negative for: history of anesthetic complications  Airway Mallampati: III  TM Distance: >3 FB Neck ROM: Full   Comment: Previous grade I view with Miller 2, easy mask with OPA Dental  (+) Edentulous Upper, Dental Advisory Given   Pulmonary asthma , sleep apnea (2L O2 at night, no CPAP) , COPD (2L O2 at night),  COPD inhaler and oxygen  dependent, Recent URI  (bronchitis 3-4 weeks ago), Residual Cough, Current Smoker and Patient abstained from smoking.   Pulmonary exam normal breath sounds clear to auscultation       Cardiovascular hypertension (bisoprolol -HCTZ, spironolactone ), Pt. on medications and Pt. on home beta blockers (-) angina +CHF  (-) Past MI, (-) Cardiac Stents and (-) CABG (-) dysrhythmias  Rhythm:Regular Rate:Normal  HLD  TTE 10/10/2020: IMPRESSIONS    1. Left ventricular ejection fraction, by estimation, is 55 to 60%. The  left ventricle has normal function. The left ventricle has no regional  wall motion abnormalities. There is mild left ventricular hypertrophy.  Left ventricular diastolic parameters  are consistent with Grade I diastolic dysfunction (impaired relaxation).   2. Right ventricular systolic function is normal. The right ventricular  size is normal. Tricuspid regurgitation signal is inadequate for assessing  PA pressure.   3. The mitral valve is normal in structure. No evidence of mitral valve  regurgitation. No evidence of mitral stenosis.   4. The aortic valve was not well visualized. Aortic valve regurgitation  is not visualized. No aortic stenosis is present.   5. The inferior vena cava is normal in size with greater than 50%  respiratory variability, suggesting right atrial  pressure of 3 mmHg.   6. Technically difficult study with poor acoustic windows.     Neuro/Psych neg Seizures PSYCHIATRIC DISORDERS Anxiety Depression     Neuromuscular disease (neuropathy) CVA (seen on imaging), No Residual Symptoms    GI/Hepatic negative GI ROS, Neg liver ROS,,,  Endo/Other  diabetes, Type 2, Oral Hypoglycemic AgentsHypothyroidism  Class 4 obesity  Renal/GU negative Renal ROS     Musculoskeletal  (+) Arthritis ,    Abdominal  (+) + obese  Peds  Hematology  (+) Blood dyscrasia (polycythemia) Lab Results      Component                Value               Date                      WBC                      13.7 (H)            11/28/2023                HGB                      17.7 (H)            11/28/2023                HCT                      54.2 (H)  11/28/2023                MCV                      95.6                11/28/2023                PLT                      189                 11/28/2023              Anesthesia Other Findings Last Ozempic : 3 weeks ago   Reproductive/Obstetrics                             Anesthesia Physical Anesthesia Plan  ASA: 3  Anesthesia Plan: MAC   Post-op Pain Management: Minimal or no pain anticipated   Induction: Intravenous  PONV Risk Score and Plan: 1 and Propofol  infusion, TIVA and Treatment may vary due to age or medical condition  Airway Management Planned: Natural Airway and Simple Face Mask  Additional Equipment:   Intra-op Plan:   Post-operative Plan:   Informed Consent: I have reviewed the patients History and Physical, chart, labs and discussed the procedure including the risks, benefits and alternatives for the proposed anesthesia with the patient or authorized representative who has indicated his/her understanding and acceptance.     Dental advisory given  Plan Discussed with: CRNA and Anesthesiologist  Anesthesia Plan Comments: (Discussed with  patient risks of MAC including, but not limited to, minor pain or discomfort, hearing people in the room, and possible need for backup general anesthesia. Risks for general anesthesia also discussed including, but not limited to, sore throat, hoarse voice, chipped/damaged teeth, injury to vocal cords, nausea and vomiting, allergic reactions, lung infection, heart attack, stroke, and death. All questions answered. )       Anesthesia Quick Evaluation

## 2023-12-21 ENCOUNTER — Encounter (HOSPITAL_COMMUNITY): Payer: Self-pay | Admitting: Gastroenterology

## 2023-12-21 ENCOUNTER — Ambulatory Visit (HOSPITAL_COMMUNITY): Admitting: Anesthesiology

## 2023-12-21 ENCOUNTER — Ambulatory Visit (HOSPITAL_COMMUNITY)
Admission: RE | Admit: 2023-12-21 | Discharge: 2023-12-21 | Disposition: A | Discharge status: Home / Self Care | Attending: Gastroenterology | Admitting: Gastroenterology

## 2023-12-21 ENCOUNTER — Encounter (HOSPITAL_COMMUNITY)
Admission: RE | Disposition: A | Discharge status: Home / Self Care | Payer: Self-pay | Source: Home / Self Care | Attending: Gastroenterology

## 2023-12-21 DIAGNOSIS — Z7984 Long term (current) use of oral hypoglycemic drugs: Secondary | ICD-10-CM | POA: Insufficient documentation

## 2023-12-21 DIAGNOSIS — K635 Polyp of colon: Secondary | ICD-10-CM | POA: Diagnosis not present

## 2023-12-21 DIAGNOSIS — Z1211 Encounter for screening for malignant neoplasm of colon: Secondary | ICD-10-CM | POA: Diagnosis not present

## 2023-12-21 DIAGNOSIS — F32A Depression, unspecified: Secondary | ICD-10-CM | POA: Insufficient documentation

## 2023-12-21 DIAGNOSIS — I509 Heart failure, unspecified: Secondary | ICD-10-CM | POA: Diagnosis not present

## 2023-12-21 DIAGNOSIS — Z8673 Personal history of transient ischemic attack (TIA), and cerebral infarction without residual deficits: Secondary | ICD-10-CM | POA: Diagnosis not present

## 2023-12-21 DIAGNOSIS — D125 Benign neoplasm of sigmoid colon: Secondary | ICD-10-CM

## 2023-12-21 DIAGNOSIS — Z5986 Financial insecurity: Secondary | ICD-10-CM | POA: Diagnosis not present

## 2023-12-21 DIAGNOSIS — D127 Benign neoplasm of rectosigmoid junction: Secondary | ICD-10-CM | POA: Diagnosis not present

## 2023-12-21 DIAGNOSIS — F419 Anxiety disorder, unspecified: Secondary | ICD-10-CM | POA: Diagnosis not present

## 2023-12-21 DIAGNOSIS — J4489 Other specified chronic obstructive pulmonary disease: Secondary | ICD-10-CM | POA: Insufficient documentation

## 2023-12-21 DIAGNOSIS — Z79899 Other long term (current) drug therapy: Secondary | ICD-10-CM | POA: Insufficient documentation

## 2023-12-21 DIAGNOSIS — F17210 Nicotine dependence, cigarettes, uncomplicated: Secondary | ICD-10-CM | POA: Diagnosis not present

## 2023-12-21 DIAGNOSIS — Z6841 Body Mass Index (BMI) 40.0 and over, adult: Secondary | ICD-10-CM | POA: Diagnosis not present

## 2023-12-21 DIAGNOSIS — D128 Benign neoplasm of rectum: Secondary | ICD-10-CM | POA: Diagnosis not present

## 2023-12-21 DIAGNOSIS — Z7985 Long-term (current) use of injectable non-insulin antidiabetic drugs: Secondary | ICD-10-CM | POA: Diagnosis not present

## 2023-12-21 DIAGNOSIS — I110 Hypertensive heart disease with heart failure: Secondary | ICD-10-CM | POA: Diagnosis not present

## 2023-12-21 DIAGNOSIS — K644 Residual hemorrhoidal skin tags: Secondary | ICD-10-CM | POA: Insufficient documentation

## 2023-12-21 DIAGNOSIS — E785 Hyperlipidemia, unspecified: Secondary | ICD-10-CM | POA: Insufficient documentation

## 2023-12-21 DIAGNOSIS — Z9981 Dependence on supplemental oxygen: Secondary | ICD-10-CM | POA: Insufficient documentation

## 2023-12-21 DIAGNOSIS — I5032 Chronic diastolic (congestive) heart failure: Secondary | ICD-10-CM

## 2023-12-21 DIAGNOSIS — E6689 Other obesity not elsewhere classified: Secondary | ICD-10-CM | POA: Diagnosis not present

## 2023-12-21 HISTORY — PX: COLONOSCOPY: SHX5424

## 2023-12-21 LAB — GLUCOSE, CAPILLARY: Glucose-Capillary: 154 mg/dL — ABNORMAL HIGH (ref 70–99)

## 2023-12-21 SURGERY — COLONOSCOPY
Anesthesia: Monitor Anesthesia Care

## 2023-12-21 MED ORDER — SODIUM CHLORIDE 0.9 % IV SOLN: INTRAVENOUS | Status: DC

## 2023-12-21 MED ORDER — LIDOCAINE HCL (CARDIAC) PF 100 MG/5ML IV SOSY
PREFILLED_SYRINGE | INTRAVENOUS | Status: DC | PRN
  Administered 2023-12-21: 50 mg via INTRAVENOUS

## 2023-12-21 MED ORDER — PROPOFOL 10 MG/ML IV BOLUS
INTRAVENOUS | Status: DC | PRN
  Administered 2023-12-21: 100 ug/kg/min via INTRAVENOUS
  Administered 2023-12-21: 50 mg via INTRAVENOUS

## 2023-12-21 NOTE — Transfer of Care (Signed)
 Immediate Anesthesia Transfer of Care Note  Patient: Autumn Massey  Procedure(s) Performed: Procedure(s): COLONOSCOPY (N/A)  Patient Location: PACU and Endoscopy Unit  Anesthesia Type:MAC  Level of Consciousness: awake, alert  and oriented  Airway & Oxygen  Therapy: Patient Spontanous Breathing and Patient connected to nasal cannula oxygen   Post-op Assessment: Report given to RN and Post -op Vital signs reviewed and stable  Post vital signs: Reviewed and stable  Last Vitals:  Vitals:   12/21/23 0706  BP: (!) 164/65  Pulse: 78  Resp: 15  Temp: 36.9 C  SpO2: 94%    Complications: No apparent anesthesia complications

## 2023-12-21 NOTE — Anesthesia Postprocedure Evaluation (Signed)
 Anesthesia Post Note  Patient: Autumn Massey  Procedure(s) Performed: COLONOSCOPY     Patient location during evaluation: PACU Anesthesia Type: MAC Level of consciousness: awake Pain management: pain level controlled Vital Signs Assessment: post-procedure vital signs reviewed and stable Respiratory status: spontaneous breathing, nonlabored ventilation and respiratory function stable Cardiovascular status: stable and blood pressure returned to baseline Postop Assessment: no apparent nausea or vomiting Anesthetic complications: no   No notable events documented.  Last Vitals:  Vitals:   12/21/23 0820 12/21/23 0825  BP: 136/70   Pulse: 80   Resp: 15   Temp:    SpO2: 92% 94%    Last Pain:  Vitals:   12/21/23 0810  TempSrc: Temporal  PainSc: 0-No pain                 Delon Aisha Arch

## 2023-12-21 NOTE — H&P (Signed)
 [facility] History and Physical   Primary Care Physician:  Norleen Lynwood ORN, MD   Reason for Procedure:   Colon cancer screening  Plan:    Screening colonoscopy     HPI: Autumn Massey is a 58 y.o. female undergoing average risk screening colonoscopy.  She has no family history of colon cancer (other than a great-grandmother) and no chronic GI symptoms. She reportedly had a colonoscopy 11 years ago, but patient does not recall this, and there is no procedure report available.   Past Medical History:  Diagnosis Date   Anxiety    ANXIETY DEPRESSION 01/29/2009   04/26/2019- no current   Arthritis    Asthma    CHF (congestive heart failure) (HCC)    DEPRESSION 11/19/2007   Diabetes (HCC)    Dyspnea    FREQUENCY, URINARY 10/17/2009   HOH (hard of hearing)    HYPERLIPIDEMIA 11/19/2007   HYPERTENSION 11/19/2007   HYPOTHYROIDISM 11/19/2007   Morbid obesity (HCC)    Neuromuscular disorder (HCC)    Neuropathy 01/26/2019   Neuropathy    Oxygen  deficiency    PNEUMONIA 08/06/2010   Pneumonia    POLYCYTHEMIA 01/29/2009   Preventative health care 04/06/2011   Sleep apnea    mild sleep apnea, on o2 at 2l at nighttime   SLEEP RELATED HYPOVENTILATION/HYPOXEMIA CCE 02/08/2009   Venous insufficiency 07/19/2012    Past Surgical History:  Procedure Laterality Date   ANTERIOR CERVICAL CORPECTOMY N/A 06/19/2021   Procedure: Cerivcal Six Corpectomy;  Surgeon: Gillie Duncans, MD;  Location: [facility] OR;  Service: Neurosurgery;  Laterality: N/A;   DILITATION & CURRETTAGE/HYSTROSCOPY WITH NOVASURE ABLATION N/A 04/28/2019   Procedure: DILATATION & CURETTAGE/HYSTEROSCOPY WITH NOVASURE ABLATION;  Surgeon: Johnnye Ade, MD;  Location: [facility] OR;  Service: Gynecology;  Laterality: N/A;  [employer] rep will be here confirmed on 04/20/19   TONSILLECTOMY     ULNAR TUNNEL RELEASE Left 02/22/2013   Procedure: LEFT ULNAR NERVE DECOMPRESSION ;  Surgeon: Franky JONELLE Curia, MD;  Location: [facility];  Service: Orthopedics;  Laterality: Left;    Prior to Admission medications   Medication Sig Start Date End Date Taking? Authorizing Provider  ACCU-CHEK GUIDE TEST test strip USE AS INSTRUCTED TO CHECK BLOOD SUGAR TWICE DAILY 10/12/23  Yes Trixie File, MD  Accu-Chek Softclix Lancets lancets USE AS INSTRUCTED TO CHECK BLOOD SUGAR ONCE DAILY 12/25/22  Yes Trixie File, MD  albuterol  (VENTOLIN  HFA) 108 [phone] Base) MCG/ACT inhaler USE 2 INHALATIONS BY MOUTH EVERY 6 HOURS AS NEEDED 04/14/23  Yes Norleen Lynwood ORN, MD  aspirin  EC 81 MG tablet Take 81 mg by mouth daily.   Yes [provider]  Blood Glucose Monitoring Suppl (ACCU-CHEK GUIDE) w/Device KIT Use as instructed to check blood sugar 2X daily 10/28/21  Yes Trixie File, MD  BREO ELLIPTA  100-25 MCG/ACT AEPB USE 1 INHALATION BY MOUTH ONCE  DAILY AT THE SAME TIME EACH DAY 12/25/22  Yes Norleen Lynwood ORN, MD  FLUoxetine  (PROZAC ) 40 MG capsule TAKE 1 CAPSULE BY MOUTH DAILY 10/12/23  Yes Norleen Lynwood ORN, MD  Gabapentin  Enacarbil (HORIZANT ) 600 MG TBCR Take 1 tablet (600 mg total) by mouth at bedtime. 11/07/22  Yes Jaffe, Adam R, DO  Lancets Misc. (ACCU-CHEK FASTCLIX LANCET) KIT Use to check blood sugar once a day 11/12/18  Yes Trixie File, MD  levothyroxine  (SYNTHROID ) 200 MCG tablet TAKE 1 TABLET BY MOUTH DAILY 12/25/22  Yes Trixie File, MD  levothyroxine  (SYNTHROID ) 25 MCG tablet TAKE 1 TABLET  BY MOUTH DAILY  BEFORE BREAKFAST WITH 200 MCG  TABLET FOR A TOTAL DAILY DOSE OF 225 MCG 12/25/22  Yes Trixie File, MD  metFORMIN  (GLUCOPHAGE -XR) 500 MG 24 hr tablet TAKE 1 TABLET BY MOUTH DAILY  WITH BREAKFAST 07/02/23  Yes Trixie File, MD  montelukast  (SINGULAIR ) 10 MG tablet TAKE 1 TABLET BY MOUTH AT  BEDTIME 10/12/23  Yes Norleen Lynwood ORN, MD  Multiple Vitamin (MULTIVITAMIN WITH MINERALS) TABS tablet Take 1 tablet by mouth daily.   Yes [provider]  oxyCODONE  (OXY IR/ROXICODONE ) 5 MG immediate release tablet  Take 5 mg by mouth every 6 (six) hours as needed. 04/20/23  Yes [provider]  OXYGEN  2lpm with sleep only  [facility]   Yes [provider]  primidone  (MYSOLINE ) 50 MG tablet TAKE 1 TABLET BY MOUTH IN THE  MORNING AND 3 TABLETS BY MOUTH  AT BEDTIME 11/07/22  Yes Jaffe, Adam R, DO  rosuvastatin  (CRESTOR ) 40 MG tablet TAKE 1 TABLET BY MOUTH DAILY 10/12/23  Yes Norleen Lynwood ORN, MD  spironolactone  (ALDACTONE ) 25 MG tablet TAKE 1 TABLET BY MOUTH TWICE  DAILY 10/12/23  Yes Trixie File, MD  tirzepatide  (MOUNJARO ) 2.5 MG/0.5ML Pen Inject 2.5 mg into the skin once a week. 07/09/23  Yes Trixie File, MD  albuterol  (PROVENTIL ) (2.5 MG/3ML) 0.083% nebulizer solution Take 3 mLs (2.5 mg total) by nebulization every 6 (six) hours as needed for wheezing or shortness of breath. 11/24/23   Norleen Lynwood ORN, MD  amoxicillin -clavulanate (AUGMENTIN ) 875-125 MG tablet Take 1 tablet by mouth 2 (two) times daily. Patient not taking: Reported on 12/21/2023 11/19/23   Gladis Elsie BROCKS, PA-C  Azelastine -Fluticasone  137-50 MCG/ACT SUSP Place 1 spray into the nose every 12 (twelve) hours. 10/06/22   Merlynn Niki FALCON, FNP  bisoprolol -hydrochlorothiazide  (ZIAC ) 5-6.25 MG tablet TAKE 1 TABLET BY MOUTH DAILY 04/14/23   Norleen Lynwood ORN, MD  cholecalciferol  (VITAMIN D3) 25 MCG (1000 UNIT) tablet Take 1,000 Units by mouth daily. Patient taking differently: Take 2,000 Units by mouth daily.    [provider]  clonazePAM  (KLONOPIN ) 0.5 MG tablet Take 1 tablet (0.5 mg total) by mouth 2 (two) times daily as needed for anxiety. 05/20/22   Norleen Lynwood ORN, MD  cyclobenzaprine  (FLEXERIL ) 5 MG tablet TAKE 1 TABLET BY MOUTH 3  TIMES DAILY AS NEEDED FOR  MUSCLE SPASM(S) Patient not taking: Reported on 12/15/2023 01/27/20   Norleen Lynwood ORN, MD  Diclofenac  Sodium (PENNSAID ) 2 % SOLN Place 2 g onto the skin 2 (two) times daily. Patient not taking: Reported on 12/15/2023 10/03/19   Claudene Arthea HERO, DO  empagliflozin  (JARDIANCE ) 25 MG  TABS tablet Take 1 tablet (25 mg total) by mouth daily before breakfast. 11/11/23   Trixie File, MD  furosemide  (LASIX ) 40 MG tablet TAKE 1 TABLET BY MOUTH DAILY Patient not taking: Reported on 12/15/2023 09/19/20   Barbaraann Darryle Ned, MD  ondansetron  (ZOFRAN -ODT) 4 MG disintegrating tablet 4mg  ODT q4 hours prn nausea/vomit Patient taking differently: as needed. 4mg  ODT q4 hours prn nausea/vomit 11/28/23   Floyd, Dan, DO  predniSONE  (DELTASONE ) 10 MG tablet Take 1 tablet (10 mg total) by mouth daily with breakfast. 3 tabs by mouth per day for 3 days,2 tabs per day for 3 days,1 tab per day for 3 days Patient not taking: Reported on 12/15/2023 11/23/23   Vivienne Delon HERO, PA-C  Semaglutide ,0.25 or 0.5MG /DOS, (OZEMPIC , 0.25 OR 0.5 MG/DOSE,) 2 MG/3ML SOPN 0.25 once wkly * 2 wks, then inc to 0.5  wkly 04/27/23   Midge Sober, DO    Current Facility-Administered Medications  Medication Dose Route Frequency Provider Last Rate Last Admin   0.9 %  sodium chloride  infusion   Intravenous Continuous Stacia Glendia BRAVO, MD        Allergies as of 09/17/2023 - Review Complete 08/02/2023  Allergen Reaction Noted   Cymbalta  [duloxetine  hcl]  03/03/2017    Family History  Problem Relation Age of Onset   Cancer Mother        melanoma    Heart disease Mother    High blood pressure Mother    Stroke Mother    Thyroid  disease Mother    Anxiety disorder Mother    Colon polyps Sister    High Cholesterol Sister    Psoriasis Brother    Osteoarthritis Maternal Grandmother 34   Scoliosis Maternal Grandmother    Cancer Maternal Grandfather        prostate cancer   Colon cancer Other    Alcohol abuse Other    Arthritis Other    Hypertension Other    Esophageal cancer Neg Hx    Rectal cancer Neg Hx    Stomach cancer Neg Hx     Social History   Socioeconomic History   Marital status: Divorced    Spouse name: Not on file   Number of children: 0   Years of education: 12   Highest  education level: GED or equivalent  Occupational History   Occupation: Disabled    Employer: [employer]  Tobacco Use   Smoking status: Every Day    Current packs/day: 0.50    Average packs/day: 0.5 packs/day for 22.0 years (11.0 ttl pk-yrs)    Types: Cigarettes   Smokeless tobacco: Never  Vaping Use   Vaping status: Never Used  Substance and Sexual Activity   Alcohol use: Not Currently    Comment: rare   Drug use: No   Sexual activity: Not Currently  Other Topics Concern   Not on file  Social History Narrative   Patient is right-handed.   Lives alone   Social Drivers of Health   Financial Resource Strain: Low Risk  (07/29/2023)   Overall Financial Resource Strain (CARDIA)    Difficulty of Paying Living Expenses: Not hard at all  Recent Concern: Financial Resource Strain - Medium Risk (06/29/2023)   Overall Financial Resource Strain (CARDIA)    Difficulty of Paying Living Expenses: Somewhat hard  Food Insecurity: No Food Insecurity (07/29/2023)   Hunger Vital Sign    Worried About Running Out of Food in the Last Year: Never true    Ran Out of Food in the Last Year: Never true  Transportation Needs: No Transportation Needs (07/29/2023)   PRAPARE - Administrator, Civil Service (Medical): No    Lack of Transportation (Non-Medical): No  Physical Activity: Inactive (07/29/2023)   Exercise Vital Sign    Days of Exercise per Week: 0 days    Minutes of Exercise per Session: 20 min  Stress: No Stress Concern Present (07/29/2023)   [employer] of Occupational Health - Occupational Stress Questionnaire    Feeling of Stress : Only a little  Social Connections: Moderately Isolated (07/29/2023)   Social Connection and Isolation Panel    Frequency of Communication with Friends and Family: More than three times a week    Frequency of Social Gatherings with Friends and Family: Once a week    Attends Religious Services: 1 to 4 times per year  Active Member of  Clubs or Organizations: No    Attends Banker Meetings: Never    Marital Status: Divorced  Catering manager Violence: Patient Unable To Answer (06/29/2023)   Humiliation, Afraid, Rape, and Kick questionnaire    Fear of Current or Ex-Partner: Patient unable to answer    Emotionally Abused: Patient unable to answer    Physically Abused: Patient unable to answer    Sexually Abused: Patient unable to answer    Review of Systems:  All other review of systems negative except as mentioned in the HPI.  Physical Exam: Vital signs BP (!) 164/65   Pulse 78   Temp 98.5 F (36.9 C) (Temporal)   Resp 15   Ht 5' 1 (1.549 m)   Wt 126.1 kg   SpO2 94%   BMI 52.53 kg/m   General:   Alert,  Well-developed, well-nourished, pleasant and cooperative in NAD Airway:  Mallampati 2 Lungs:  Clear throughout to auscultation.   Heart:  Regular rate and rhythm; no murmurs, clicks, rubs,  or gallops. Abdomen:  Soft, nontender and nondistended. Normal bowel sounds.   Neuro/Psych:  Normal mood and affect. A and O x 3   Deklan Minar E. Stacia, MD [facility]

## 2023-12-21 NOTE — Discharge Instructions (Signed)

## 2023-12-21 NOTE — Op Note (Signed)
 [facility] Patient Name: Autumn Massey Procedure Date: 12/21/2023 MRN: [id number] Attending MD: Glendia BRAVO. Stacia , MD, [id number] Date of Birth: 09/09/1965 CSN: [id number] Age: 58 Admit Type: Inpatient Procedure:                Colonoscopy Indications:              Screening for colorectal malignant neoplasm (last                            colonoscopy was more than 10 years ago) Providers:                Glendia E. Stacia, MD, Ozell Pouch, Curtistine Bishop, Technician Referring MD:              Medicines:                Monitored Anesthesia Care Complications:            No immediate complications. Estimated Blood Loss:     Estimated blood loss was minimal. Procedure:                Pre-Anesthesia Assessment:                           - Prior to the procedure, a History and Physical                            was performed, and patient medications and                            allergies were reviewed. The patient's tolerance of                            previous anesthesia was also reviewed. The risks                            and benefits of the procedure and the sedation                            options and risks were discussed with the patient.                            All questions were answered, and informed consent                            was obtained. Prior Anticoagulants: The patient has                            taken no anticoagulant or antiplatelet agents                            except for aspirin . ASA Grade Assessment: III - A  patient with severe systemic disease. After                            reviewing the risks and benefits, the patient was                            deemed in satisfactory condition to undergo the                            procedure.                           After obtaining informed consent, the colonoscope                            was passed under  direct vision. Throughout the                            procedure, the patient's blood pressure, pulse, and                            oxygen  saturations were monitored continuously. The                            CF-HQ190L ([id number] Olympus colonoscope was                            introduced through the anus and advanced to the the                            terminal ileum, with identification of the                            appendiceal orifice and IC valve. The colonoscopy                            was performed without difficulty. The patient                            tolerated the procedure well. The quality of the                            bowel preparation was good. The terminal ileum,                            ileocecal valve, appendiceal orifice, and rectum                            were photographed. Scope In: 7:33:03 AM Scope Out: 8:03:37 AM Scope Withdrawal Time: 0 hours 23 minutes 26 seconds  Total Procedure Duration: 0 hours 30 minutes 34 seconds  Findings:      Skin tags were found on perianal exam.      The digital rectal exam was normal. Pertinent negatives include normal       sphincter tone  and no palpable rectal lesions.      A 10 mm polyp was found in the sigmoid colon. The polyp was       pedunculated. The polyp was removed with a hot snare. Resection and       retrieval were complete. Estimated blood loss was minimal.      A 5 mm polyp was found in the sigmoid colon. The polyp was sessile. The       polyp was removed with a cold snare. Resection and retrieval were       complete. Estimated blood loss was minimal.      A 4 mm polyp was found in the rectum. The polyp was sessile. The polyp       was removed with a cold snare. Resection and retrieval were complete.       Estimated blood loss was minimal.      The exam was otherwise normal throughout the examined colon.      The terminal ileum appeared normal.      The retroflexed view of the distal rectum  and anal verge was normal and       showed no anal or rectal abnormalities. Impression:               - Perianal skin tags found on perianal exam.                           - One 10 mm polyp in the sigmoid colon, removed                            with a hot snare. Resected and retrieved.                           - One 5 mm polyp in the sigmoid colon, removed with                            a cold snare. Resected and retrieved.                           - One 4 mm polyp in the rectum, removed with a cold                            snare. Resected and retrieved.                           - The examined portion of the ileum was normal.                           - The distal rectum and anal verge are normal on                            retroflexion view. Moderate Sedation:      Not Applicable - Patient had care per Anesthesia. Recommendation:           - Patient has a contact number available for                            emergencies.  The signs and symptoms of potential                            delayed complications were discussed with the                            patient. Return to normal activities tomorrow.                            Written discharge instructions were provided to the                            patient.                           - Resume previous diet.                           - Continue present medications.                           - Await pathology results.                           - Repeat colonoscopy (date not yet determined) for                            surveillance based on pathology results. Procedure Code(s):        --- Professional ---                           [phone], Colonoscopy, flexible; with removal of                            tumor(s), polyp(s), or other lesion(s) by snare                            technique Diagnosis Code(s):        --- Professional ---                           Z12.11, Encounter for screening for malignant                             neoplasm of colon                           D12.5, Benign neoplasm of sigmoid colon                           D12.8, Benign neoplasm of rectum                           K64.4, Residual hemorrhoidal skin tags CPT copyright 2022 [employer]. All rights reserved. The codes documented in this report are preliminary and upon coder review may  be revised to meet current compliance requirements. Makenzie Vittorio E. Stacia, MD 12/21/2023  8:13:09 AM This report has been signed electronically. Number of Addenda: 0

## 2023-12-22 ENCOUNTER — Ambulatory Visit: Payer: Self-pay | Admitting: Gastroenterology

## 2023-12-22 ENCOUNTER — Encounter (HOSPITAL_COMMUNITY): Payer: Self-pay | Admitting: Gastroenterology

## 2023-12-22 LAB — SURGICAL PATHOLOGY

## 2023-12-22 NOTE — Progress Notes (Signed)
 Autumn Massey,   The large polyp that I removed during your recent procedure was completely benign but was proven to be a pre-cancerous polyp that MAY have grown into cancers if it had not been removed.  The other two polyps were not precancerous.  Studies shows that at least 20% of women over age 58 and 30% of men over age 2 have pre-cancerous polyps.  Based on current nationally recognized surveillance guidelines, I recommend that you have a repeat colonoscopy in 3 years.   If you develop any new rectal bleeding, abdominal pain or significant bowel habit changes, please contact me before then.

## 2023-12-28 ENCOUNTER — Encounter: Payer: Self-pay | Admitting: Internal Medicine

## 2023-12-28 DIAGNOSIS — R300 Dysuria: Secondary | ICD-10-CM

## 2023-12-28 MED ORDER — PHENAZOPYRIDINE HCL 100 MG PO TABS: 100.0000 mg | ORAL_TABLET | Freq: Three times a day (TID) | ORAL | 1 refills | Status: DC | PRN

## 2023-12-30 ENCOUNTER — Emergency Department (HOSPITAL_COMMUNITY)

## 2023-12-30 ENCOUNTER — Encounter (HOSPITAL_COMMUNITY): Payer: Self-pay | Admitting: Emergency Medicine

## 2023-12-30 ENCOUNTER — Emergency Department (HOSPITAL_COMMUNITY)
Admission: EM | Admit: 2023-12-30 | Discharge: 2023-12-30 | Disposition: A | Discharge status: Home / Self Care | Source: Ambulatory Visit

## 2023-12-30 ENCOUNTER — Telehealth (INDEPENDENT_AMBULATORY_CARE_PROVIDER_SITE_OTHER): Admitting: Family Medicine

## 2023-12-30 ENCOUNTER — Other Ambulatory Visit: Payer: Self-pay

## 2023-12-30 ENCOUNTER — Telehealth: Payer: Self-pay | Admitting: Neurology

## 2023-12-30 DIAGNOSIS — E1169 Type 2 diabetes mellitus with other specified complication: Secondary | ICD-10-CM

## 2023-12-30 DIAGNOSIS — Z7985 Long-term (current) use of injectable non-insulin antidiabetic drugs: Secondary | ICD-10-CM | POA: Diagnosis not present

## 2023-12-30 DIAGNOSIS — D751 Secondary polycythemia: Secondary | ICD-10-CM

## 2023-12-30 DIAGNOSIS — Z79899 Other long term (current) drug therapy: Secondary | ICD-10-CM | POA: Insufficient documentation

## 2023-12-30 DIAGNOSIS — I110 Hypertensive heart disease with heart failure: Secondary | ICD-10-CM | POA: Insufficient documentation

## 2023-12-30 DIAGNOSIS — E1159 Type 2 diabetes mellitus with other circulatory complications: Secondary | ICD-10-CM | POA: Diagnosis not present

## 2023-12-30 DIAGNOSIS — E785 Hyperlipidemia, unspecified: Secondary | ICD-10-CM | POA: Diagnosis not present

## 2023-12-30 DIAGNOSIS — L03116 Cellulitis of left lower limb: Secondary | ICD-10-CM | POA: Insufficient documentation

## 2023-12-30 DIAGNOSIS — E119 Type 2 diabetes mellitus without complications: Secondary | ICD-10-CM | POA: Diagnosis not present

## 2023-12-30 DIAGNOSIS — M7989 Other specified soft tissue disorders: Secondary | ICD-10-CM | POA: Diagnosis not present

## 2023-12-30 DIAGNOSIS — I152 Hypertension secondary to endocrine disorders: Secondary | ICD-10-CM | POA: Diagnosis not present

## 2023-12-30 DIAGNOSIS — Z7984 Long term (current) use of oral hypoglycemic drugs: Secondary | ICD-10-CM | POA: Insufficient documentation

## 2023-12-30 DIAGNOSIS — R2240 Localized swelling, mass and lump, unspecified lower limb: Secondary | ICD-10-CM | POA: Diagnosis not present

## 2023-12-30 DIAGNOSIS — I5032 Chronic diastolic (congestive) heart failure: Secondary | ICD-10-CM | POA: Diagnosis not present

## 2023-12-30 DIAGNOSIS — E876 Hypokalemia: Secondary | ICD-10-CM | POA: Insufficient documentation

## 2023-12-30 DIAGNOSIS — Z7982 Long term (current) use of aspirin: Secondary | ICD-10-CM | POA: Insufficient documentation

## 2023-12-30 DIAGNOSIS — I509 Heart failure, unspecified: Secondary | ICD-10-CM | POA: Insufficient documentation

## 2023-12-30 LAB — COMPREHENSIVE METABOLIC PANEL WITH GFR
ALT: 12 U/L (ref 0–44)
AST: 18 U/L (ref 15–41)
Albumin: 3.3 g/dL — ABNORMAL LOW (ref 3.5–5.0)
Alkaline Phosphatase: 58 U/L (ref 38–126)
Anion gap: 10 (ref 5–15)
BUN: 10 mg/dL (ref 6–20)
CO2: 26 mmol/L (ref 22–32)
Calcium: 8.8 mg/dL — ABNORMAL LOW (ref 8.9–10.3)
Chloride: 100 mmol/L (ref 98–111)
Creatinine, Ser: 0.57 mg/dL (ref 0.44–1.00)
GFR, Estimated: >60 mL/min (ref >60)
Glucose, Bld: 103 mg/dL — ABNORMAL HIGH (ref 70–99)
Potassium: 3.4 mmol/L — ABNORMAL LOW (ref 3.5–5.1)
Sodium: 136 mmol/L (ref 135–145)
Total Bilirubin: 0.6 mg/dL (ref 0.0–1.2)
Total Protein: 6.7 g/dL (ref 6.5–8.1)

## 2023-12-30 LAB — CBC WITH DIFFERENTIAL/PLATELET
Abs Immature Granulocytes: 0.03 10*3/uL (ref 0.00–0.07)
Basophils Absolute: 0 10*3/uL (ref 0.0–0.1)
Basophils Relative: 0 %
Eosinophils Absolute: 0.1 10*3/uL (ref 0.0–0.5)
Eosinophils Relative: 2 %
HCT: 47.8 % — ABNORMAL HIGH (ref 36.0–46.0)
Hemoglobin: 15.5 g/dL — ABNORMAL HIGH (ref 12.0–15.0)
Immature Granulocytes: 0 %
Lymphocytes Relative: 21 %
Lymphs Abs: 1.8 10*3/uL (ref 0.7–4.0)
MCH: 31 pg (ref 26.0–34.0)
MCHC: 32.4 g/dL (ref 30.0–36.0)
MCV: 95.6 fL (ref 80.0–100.0)
Monocytes Absolute: 0.7 10*3/uL (ref 0.1–1.0)
Monocytes Relative: 9 %
Neutro Abs: 5.6 10*3/uL (ref 1.7–7.7)
Neutrophils Relative %: 68 %
Platelets: 149 10*3/uL — ABNORMAL LOW (ref 150–400)
RBC: 5 MIL/uL (ref 3.87–5.11)
RDW: 13.7 % (ref 11.5–15.5)
WBC: 8.3 10*3/uL (ref 4.0–10.5)
nRBC: 0 % (ref 0.0–0.2)

## 2023-12-30 MED ORDER — ENOXAPARIN SODIUM 100 MG/ML IJ SOSY
200.0000 mg | PREFILLED_SYRINGE | Freq: Once | INTRAMUSCULAR | Status: AC
  Administered 2023-12-30: 200 mg via SUBCUTANEOUS
  Filled 2023-12-30: qty 2

## 2023-12-30 MED ORDER — CEPHALEXIN 500 MG PO CAPS: 500.0000 mg | ORAL_CAPSULE | Freq: Four times a day (QID) | ORAL | 0 refills | Status: AC

## 2023-12-30 MED ORDER — CEPHALEXIN 500 MG PO CAPS
500.0000 mg | ORAL_CAPSULE | Freq: Once | ORAL | Status: AC
  Administered 2023-12-30: 500 mg via ORAL
  Filled 2023-12-30: qty 1

## 2023-12-30 NOTE — Discharge Instructions (Addendum)
 Today you were seen for left leg swelling and redness.  Please pick up your antibiotic and take as prescribed.  You have been given an outpatient referral for a DVT study, please call them first thing in the morning to get your appointment time.  Please return to the ED if you have shortness of breath, worsening symptoms, or numbness in your leg.  Thank you for letting us  treat you today. After reviewing your labs and imaging, I feel you are safe to go home. Please follow up with your PCP in the next several days and provide them with your records from this visit. Return to the Emergency Room if pain becomes severe or symptoms worsen.

## 2023-12-30 NOTE — ED Provider Notes (Signed)
 [place] EMERGENCY DEPARTMENT AT [facility] Provider Note   CSN: [id number] Arrival date & time: 12/30/23  1605     Patient presents with: Leg Swelling   Autumn Massey is a 58 y.o. female past medical history significant for obesity, CHF, hypertension, venous insufficiency, and diabetes presents today for left lower extremity swelling and erythema x 2 days.  Patient also reports warmth and calf pain.  Patient denies fever, chills, numbness, weakness, nausea, vomiting, shortness of breath, or chest pain.   HPI     Prior to Admission medications   Medication Sig Start Date End Date Taking? Authorizing Provider  cephALEXin  (KEFLEX ) 500 MG capsule Take 1 capsule (500 mg total) by mouth 4 (four) times daily for 7 days. 12/30/23 01/06/24 Yes Chara Marquard N, PA-C  ACCU-CHEK GUIDE TEST test strip USE AS INSTRUCTED TO CHECK BLOOD SUGAR TWICE DAILY 10/12/23   Trixie File, MD  Accu-Chek Softclix Lancets lancets USE AS INSTRUCTED TO CHECK BLOOD SUGAR ONCE DAILY 12/25/22   Trixie File, MD  albuterol  (PROVENTIL ) (2.5 MG/3ML) 0.083% nebulizer solution Take 3 mLs (2.5 mg total) by nebulization every 6 (six) hours as needed for wheezing or shortness of breath. 11/24/23   Norleen Lynwood ORN, MD  albuterol  (VENTOLIN  HFA) 108 (90 Base) MCG/ACT inhaler USE 2 INHALATIONS BY MOUTH EVERY 6 HOURS AS NEEDED 04/14/23   Norleen Lynwood ORN, MD  amoxicillin -clavulanate (AUGMENTIN ) 875-125 MG tablet Take 1 tablet by mouth 2 (two) times daily. Patient not taking: Reported on 12/21/2023 11/19/23   Gladis Elsie BROCKS, PA-C  aspirin  EC 81 MG tablet Take 81 mg by mouth daily.    [provider]  Azelastine -Fluticasone  137-50 MCG/ACT SUSP Place 1 spray into the nose every 12 (twelve) hours. 10/06/22   Merlynn Niki FALCON, FNP  bisoprolol -hydrochlorothiazide  (ZIAC ) 5-6.25 MG tablet TAKE 1 TABLET BY MOUTH DAILY 04/14/23   Norleen Lynwood ORN, MD  Blood Glucose Monitoring Suppl (ACCU-CHEK GUIDE) w/Device KIT Use as  instructed to check blood sugar 2X daily 10/28/21   Trixie File, MD  BREO ELLIPTA  100-25 MCG/ACT AEPB USE 1 INHALATION BY MOUTH ONCE  DAILY AT THE SAME TIME EACH DAY 12/25/22   Norleen Lynwood ORN, MD  cholecalciferol  (VITAMIN D3) 25 MCG (1000 UNIT) tablet Take 1,000 Units by mouth daily. Patient taking differently: Take 2,000 Units by mouth daily.    [provider]  clonazePAM  (KLONOPIN ) 0.5 MG tablet Take 1 tablet (0.5 mg total) by mouth 2 (two) times daily as needed for anxiety. 05/20/22   Norleen Lynwood ORN, MD  cyclobenzaprine  (FLEXERIL ) 5 MG tablet TAKE 1 TABLET BY MOUTH 3  TIMES DAILY AS NEEDED FOR  MUSCLE SPASM(S) Patient not taking: Reported on 12/15/2023 01/27/20   Norleen Lynwood ORN, MD  Diclofenac  Sodium (PENNSAID ) 2 % SOLN Place 2 g onto the skin 2 (two) times daily. Patient not taking: Reported on 12/15/2023 10/03/19   Claudene Arthea HERO, DO  empagliflozin  (JARDIANCE ) 25 MG TABS tablet Take 1 tablet (25 mg total) by mouth daily before breakfast. 11/11/23   Trixie File, MD  FLUoxetine  (PROZAC ) 40 MG capsule TAKE 1 CAPSULE BY MOUTH DAILY 10/12/23   Norleen Lynwood ORN, MD  furosemide  (LASIX ) 40 MG tablet TAKE 1 TABLET BY MOUTH DAILY Patient not taking: Reported on 12/15/2023 09/19/20   Barbaraann Darryle Ned, MD  Gabapentin  Enacarbil (HORIZANT ) 600 MG TBCR Take 1 tablet (600 mg total) by mouth at bedtime. 11/07/22   Skeet Juliene SAUNDERS, DO  Lancets Misc. (ACCU-CHEK FASTCLIX LANCET) KIT  Use to check blood sugar once a day 11/12/18   Trixie File, MD  levothyroxine  (SYNTHROID ) 200 MCG tablet TAKE 1 TABLET BY MOUTH DAILY 12/25/22   Trixie File, MD  levothyroxine  (SYNTHROID ) 25 MCG tablet TAKE 1 TABLET BY MOUTH DAILY  BEFORE BREAKFAST WITH 200 MCG  TABLET FOR A TOTAL DAILY DOSE OF 225 MCG 12/25/22   Trixie File, MD  metFORMIN  (GLUCOPHAGE -XR) 500 MG 24 hr tablet TAKE 1 TABLET BY MOUTH DAILY  WITH BREAKFAST 07/02/23   Trixie File, MD  montelukast  (SINGULAIR ) 10 MG tablet TAKE 1 TABLET BY MOUTH AT   BEDTIME 10/12/23   Norleen Lynwood ORN, MD  Multiple Vitamin (MULTIVITAMIN WITH MINERALS) TABS tablet Take 1 tablet by mouth daily.    [provider]  ondansetron  (ZOFRAN -ODT) 4 MG disintegrating tablet 4mg  ODT q4 hours prn nausea/vomit Patient taking differently: as needed. 4mg  ODT q4 hours prn nausea/vomit 11/28/23   Emil Share, DO  oxyCODONE  (OXY IR/ROXICODONE ) 5 MG immediate release tablet Take 5 mg by mouth every 6 (six) hours as needed. 04/20/23   [provider]  OXYGEN  2lpm with sleep only  [facility]    [provider]  phenazopyridine (PYRIDIUM) 100 MG tablet Take 1 tablet (100 mg total) by mouth 3 (three) times daily as needed for pain. 12/28/23   Norleen Lynwood ORN, MD  predniSONE  (DELTASONE ) 10 MG tablet Take 1 tablet (10 mg total) by mouth daily with breakfast. 3 tabs by mouth per day for 3 days,2 tabs per day for 3 days,1 tab per day for 3 days Patient not taking: Reported on 12/15/2023 11/23/23   Vivienne Delon HERO, PA-C  primidone  (MYSOLINE ) 50 MG tablet TAKE 1 TABLET BY MOUTH IN THE  MORNING AND 3 TABLETS BY MOUTH  AT BEDTIME 11/07/22   Skeet Juliene SAUNDERS, DO  rosuvastatin  (CRESTOR ) 40 MG tablet TAKE 1 TABLET BY MOUTH DAILY 10/12/23   Norleen Lynwood ORN, MD  spironolactone  (ALDACTONE ) 25 MG tablet TAKE 1 TABLET BY MOUTH TWICE  DAILY 10/12/23   Trixie File, MD  tirzepatide  (MOUNJARO ) 2.5 MG/0.5ML Pen Inject 2.5 mg into the skin once a week. 07/09/23   Trixie File, MD    Allergies: Cymbalta  [duloxetine  hcl]    Review of Systems  Cardiovascular:  Positive for leg swelling.  Musculoskeletal:  Positive for myalgias.  Skin:  Positive for color change.    Updated Vital Signs BP (!) 127/55   Pulse 84   Temp 99.2 F (37.3 C) (Oral)   Resp 18   Ht 5' 1 (1.549 m)   Wt 131.5 kg   SpO2 96%   BMI 54.80 kg/m   Physical Exam Vitals and nursing note reviewed.  Constitutional:      General: She is not in acute distress.    Appearance: Normal appearance. She is  well-developed. She is not ill-appearing.  HENT:     Head: Normocephalic and atraumatic.     Right Ear: External ear normal.     Left Ear: External ear normal.     Nose: Nose normal.  Eyes:     Conjunctiva/sclera: Conjunctivae normal.  Cardiovascular:     Rate and Rhythm: Normal rate and regular rhythm.     Pulses: Normal pulses.     Heart sounds: Normal heart sounds. No murmur heard. Pulmonary:     Effort: Pulmonary effort is normal. No respiratory distress.  Abdominal:     Palpations: Abdomen is soft.     Tenderness: There is no abdominal tenderness.  Musculoskeletal:  General: Swelling present.     Cervical back: Neck supple.     Right lower leg: Edema present.     Left lower leg: Edema present.     Comments: Patient with bilateral lower extremity pitting edema with left greater than right.  Chronic skin changes noted on lower lower right extremity.  Both legs have areas of erythema and warmth with left greater than right.  Dorsalis pedis pulses present bilaterally using Doppler.  Patient does report tenderness to palpation of her posterior left calf.  Skin:    General: Skin is warm and dry.     Capillary Refill: Capillary refill takes less than 2 seconds.     Findings: Erythema present.  Neurological:     General: No focal deficit present.     Mental Status: She is alert and oriented to person, place, and time.  Psychiatric:        Mood and Affect: Mood normal.     (all labs ordered are listed, but only abnormal results are displayed) Labs Reviewed  COMPREHENSIVE METABOLIC PANEL WITH GFR - Abnormal; Notable for the following components:      Result Value   Potassium 3.4 (*)    Glucose, Bld 103 (*)    Calcium  8.8 (*)    Albumin  3.3 (*)    All other components within normal limits  CBC WITH DIFFERENTIAL/PLATELET - Abnormal; Notable for the following components:   Hemoglobin 15.5 (*)    HCT 47.8 (*)    Platelets 149 (*)    All other components within normal  limits    EKG: None  Radiology: DG Tibia/Fibula Right Result Date: 12/30/2023 CLINICAL DATA:  pain swelling EXAM: RIGHT TIBIA AND FIBULA - 2 VIEW COMPARISON:  X-ray left tibia fibula 12/30/2023 FINDINGS: There is no evidence of fracture or other focal bone lesions. Soft tissues are unremarkable. Diffuse subcutaneus soft tissue dermal calcifications of the legs, right greater than left. IMPRESSION: 1. No acute displaced fracture or dislocation. 2. Diffuse subcutaneus soft tissue dermal calcifications of the legs, right greater than left. Electronically Signed   By: Morgane  Naveau M.D.   On: 12/30/2023 17:21   DG Tibia/Fibula Left Result Date: 12/30/2023 CLINICAL DATA:  pain swelling EXAM: LEFT TIBIA AND FIBULA - 2 VIEW COMPARISON:  None Available. FINDINGS: There is no evidence of fracture or other focal bone lesions. Soft tissues are unremarkable. Diffuse subcutaneus soft tissue dermal calcifications of the leg. IMPRESSION: 1. No acute displaced fracture or dislocation. 2. Diffuse subcutaneus soft tissue dermal calcifications of the legs. Electronically Signed   By: Morgane  Naveau M.D.   On: 12/30/2023 17:19     Procedures   Medications Ordered in the ED  enoxaparin  (LOVENOX ) 100 mg/mL injection 195 mg (has no administration in time range)  cephALEXin  (KEFLEX ) capsule 500 mg (has no administration in time range)                                    Medical Decision Making  This patient presents to the ED for concern of leg swelling and erythema differential diagnosis includes cellulitis, osteomyelitis, fluid overload, compartment syndrome  Additional history obtained   Additional history obtained from Electronic Medical Record External records from outside source obtained and reviewed including family medicine notes   Lab Tests:  I Ordered, and personally interpreted labs.  The pertinent results include: CBC with elevated hemoglobin at 15.5 which is chronic per  historical values,  no leukocytosis, minimally decreased platelets at 149, mild hypokalemia 3.4, mildly reduced calcium  at 8.8   Imaging Studies ordered:  I ordered imaging studies including bilateral tib-fib x-rays I independently visualized and interpreted imaging which showed diffuse subcutaneous soft tissue dermal calcifications of the legs right greater than left I agree with the radiologist interpretation   Medicines ordered and prescription drug management:  I ordered medication including Lovenox  and Keflex     I have reviewed the patients home medicines and have made adjustments as needed   Problem List / ED Course:  Considered for admission or further workup however we do not have vascular ultrasound at this time.  Patient given outpatient referral for DVT study.  Patient given dose of Lovenox  while in ED and first dose of Keflex  for possible cellulitis.  Patient given return precautions.  I feel patient is safe for discharge at this time.        Final diagnoses:  Leg swelling  Cellulitis of left lower extremity    ED Discharge Orders          Ordered    cephALEXin  (KEFLEX ) 500 MG capsule  4 times daily        12/30/23 1816    LE Venous       Comments: IMPORTANT PATIENT INSTRUCTIONS: You have been scheduled for an Outpatient Vascular Study at [facility].  If tomorrow is a Saturday, Sunday or holiday, please go to the [facility] Emergency Department Registration Desk at 11 am tomorrow morning and tell them you are there for a vascular study.  If tomorrow is a weekday (Monday-Friday), please go to the [facility] (address [address] [place]) at 8 am and report to the 4th floor registration Zone A.  Inform registration that you are there for a vascular study.   12/30/23 1816               Francis Ileana SAILOR, PA-C 12/30/23 1817    Ula Prentice SAUNDERS, MD 12/30/23 2224

## 2023-12-30 NOTE — ED Triage Notes (Signed)
 Patient comes in for left lower leg swelling and redness. Patient recently had a colonoscopy. Patient leg is warmer to touch. Patient states this started 2 nights ago.

## 2023-12-30 NOTE — Telephone Encounter (Signed)
 Pt called to get gabapentin  ordered. She would prefer 90 days but 30 will do.   [facility]

## 2023-12-31 ENCOUNTER — Ambulatory Visit (HOSPITAL_COMMUNITY)
Admission: RE | Admit: 2023-12-31 | Discharge: 2023-12-31 | Disposition: A | Discharge status: Home / Self Care | Source: Ambulatory Visit | Attending: Vascular Surgery | Admitting: Vascular Surgery

## 2023-12-31 ENCOUNTER — Other Ambulatory Visit (HOSPITAL_COMMUNITY): Payer: Self-pay | Admitting: Emergency Medicine

## 2023-12-31 DIAGNOSIS — L03116 Cellulitis of left lower limb: Secondary | ICD-10-CM | POA: Diagnosis not present

## 2023-12-31 DIAGNOSIS — M7989 Other specified soft tissue disorders: Secondary | ICD-10-CM | POA: Diagnosis not present

## 2023-12-31 NOTE — Telephone Encounter (Signed)
 Patient advised a visit is needed to get further refills.   [facility] [address]    Patient transferred to the front desk to schedule a follow up.

## 2023-12-31 NOTE — Telephone Encounter (Signed)
 Pt is sch for follow up on  01-06-24

## 2024-01-01 ENCOUNTER — Other Ambulatory Visit: Payer: Self-pay | Admitting: Internal Medicine

## 2024-01-05 NOTE — Progress Notes (Unsigned)
 NEUROLOGY FOLLOW UP OFFICE NOTE  Autumn Massey [id number]  Assessment/Plan:   Essential tremor Residual left ulnar neuralgia s/p decompression and neuropathy Cervical spinal stenosis and cervical HNP C6/7 s/p corpectomy Polyneuropathy, diabetic   Essential tremor management:  Primidone  50mg  in AM and 150mg  at bedtime  Neuropathic pain management:  Change to generic gabapentin  600mg  at bedtime  Follow up 1 year.   Subjective:  Autumn Massey is a 58 year old right-handed woman with hypothyroidism, hyperlipidemia, diabetes, hypertension, hypoventilation, cerebrovascular disease and former smoker who follows up for essential tremor.   UPDATE: Current medication: primidone  50mg  in AM and 150mg  at bedtime.  Other medications include Horizant  600mg  at bedtime for neuropathic pain.  Insurance no longer covering Horizant .  Out for 2 weeks.  Neuropathic pain increased.    Tremors are stable.     HISTORY:  She underwent left ulnar decompression surgery in August 2014.  When the cast was removed the following month, she noted twitching of her fifth digit.  An MRI of the cervical spine was performed in December 2014, which revealed no structural etiology for radiculopathy.  She had a NCV-EMG in January 2015 which was essentially normal.  She continued to have numbness, paresthesias and weakness of the fourth and fifth digits of the left hand.  She later developed tremor in both hands, more noticeable in the right hand since she is right-handed dominant.  It occurs when she is writing or holding utensils.  She denies gait difficulty, rigidity, lack of sense of smell, or history consistent with REM sleep behavior.  She is concerned about Parkinson's disease because her maternal grandfather and uncle had it.  Another uncle had tremor.   She has numbness and tingling in the toes and fingers.  She also notes electric pain in the right leg below the knee to the foot.  Continued to have numbness and  pain despite ulnar decompression. In October, she started having weakness in the hands.  MRI of cervical spine on 04/26/2021 showed large disc extrusion causing severe spinal stenosis impinging the spinal cord at the C5-7 level.  Underwent C6 corpectomy and arthrodesis from C5-7 in December 2022.  For neuropathic pain, she has tried gabapentin , Cymbalta , and Lyrica .  PAST MEDICAL HISTORY: Past Medical History:  Diagnosis Date   Anxiety    ANXIETY DEPRESSION 01/29/2009   04/26/2019- no current   Arthritis    Asthma    CHF (congestive heart failure) (HCC)    DEPRESSION 11/19/2007   Diabetes (HCC)    Dyspnea    FREQUENCY, URINARY 10/17/2009   HOH (hard of hearing)    HYPERLIPIDEMIA 11/19/2007   HYPERTENSION 11/19/2007   HYPOTHYROIDISM 11/19/2007   Morbid obesity (HCC)    Neuromuscular disorder (HCC)    Neuropathy 01/26/2019   Neuropathy    Oxygen  deficiency    PNEUMONIA 08/06/2010   Pneumonia    POLYCYTHEMIA 01/29/2009   Preventative health care 04/06/2011   Sleep apnea    mild sleep apnea, on o2 at 2l at nighttime   SLEEP RELATED HYPOVENTILATION/HYPOXEMIA CCE 02/08/2009   Venous insufficiency 07/19/2012    MEDICATIONS: Current Outpatient Medications on File Prior to Visit  Medication Sig Dispense Refill   ACCU-CHEK GUIDE TEST test strip USE AS INSTRUCTED TO CHECK BLOOD SUGAR TWICE DAILY 200 strip 2   Accu-Chek Softclix Lancets lancets USE AS INSTRUCTED TO CHECK BLOOD SUGAR ONCE DAILY 100 each 2   albuterol  (PROVENTIL ) (2.5 MG/3ML) 0.083% nebulizer solution Take 3 mLs (2.5 mg total) by nebulization  every 6 (six) hours as needed for wheezing or shortness of breath. 75 mL 1   albuterol  (VENTOLIN  HFA) 108 (90 Base) MCG/ACT inhaler USE 2 INHALATIONS BY MOUTH EVERY 6 HOURS AS NEEDED 34 g 2   amoxicillin -clavulanate (AUGMENTIN ) 875-125 MG tablet Take 1 tablet by mouth 2 (two) times daily. (Patient not taking: Reported on 12/21/2023) 14 tablet 0   aspirin  EC 81 MG tablet Take 81 mg by  mouth daily.     Azelastine -Fluticasone  137-50 MCG/ACT SUSP Place 1 spray into the nose every 12 (twelve) hours. 23 g 0   bisoprolol -hydrochlorothiazide  (ZIAC ) 5-6.25 MG tablet TAKE 1 TABLET BY MOUTH DAILY 100 tablet 2   Blood Glucose Monitoring Suppl (ACCU-CHEK GUIDE) w/Device KIT Use as instructed to check blood sugar 2X daily 1 kit 0   BREO ELLIPTA  100-25 MCG/ACT AEPB USE 1 INHALATION BY MOUTH ONCE  DAILY AT THE SAME TIME EACH DAY 180 each 3   cephALEXin  (KEFLEX ) 500 MG capsule Take 1 capsule (500 mg total) by mouth 4 (four) times daily for 7 days. 28 capsule 0   cholecalciferol  (VITAMIN D3) 25 MCG (1000 UNIT) tablet Take 1,000 Units by mouth daily. (Patient taking differently: Take 2,000 Units by mouth daily.)     clonazePAM  (KLONOPIN ) 0.5 MG tablet Take 1 tablet (0.5 mg total) by mouth 2 (two) times daily as needed for anxiety. 60 tablet 5   cyclobenzaprine  (FLEXERIL ) 5 MG tablet TAKE 1 TABLET BY MOUTH 3  TIMES DAILY AS NEEDED FOR  MUSCLE SPASM(S) (Patient not taking: Reported on 12/15/2023) 270 tablet 1   Diclofenac  Sodium (PENNSAID ) 2 % SOLN Place 2 g onto the skin 2 (two) times daily. (Patient not taking: Reported on 12/15/2023) 112 g 3   empagliflozin  (JARDIANCE ) 25 MG TABS tablet Take 1 tablet (25 mg total) by mouth daily before breakfast. 30 tablet 5   FLUoxetine  (PROZAC ) 40 MG capsule TAKE 1 CAPSULE BY MOUTH DAILY 100 capsule 2   furosemide  (LASIX ) 40 MG tablet TAKE 1 TABLET BY MOUTH DAILY (Patient not taking: Reported on 12/15/2023) 90 tablet 1   Gabapentin  Enacarbil (HORIZANT ) 600 MG TBCR Take 1 tablet (600 mg total) by mouth at bedtime. 90 tablet 3   Lancets Misc. (ACCU-CHEK FASTCLIX LANCET) KIT Use to check blood sugar once a day 1 kit 0   levothyroxine  (SYNTHROID ) 200 MCG tablet TAKE 1 TABLET BY MOUTH DAILY 100 tablet 2   levothyroxine  (SYNTHROID ) 25 MCG tablet TAKE 1 TABLET BY MOUTH DAILY  BEFORE BREAKFAST WITH 200 MCG  TABLET FOR A TOTAL DAILY DOSE OF 225 MCG 100 tablet 2   metFORMIN   (GLUCOPHAGE -XR) 500 MG 24 hr tablet TAKE 1 TABLET BY MOUTH DAILY  WITH BREAKFAST 100 tablet 2   montelukast  (SINGULAIR ) 10 MG tablet TAKE 1 TABLET BY MOUTH AT  BEDTIME 100 tablet 2   Multiple Vitamin (MULTIVITAMIN WITH MINERALS) TABS tablet Take 1 tablet by mouth daily.     ondansetron  (ZOFRAN -ODT) 4 MG disintegrating tablet 4mg  ODT q4 hours prn nausea/vomit (Patient taking differently: as needed. 4mg  ODT q4 hours prn nausea/vomit) 20 tablet 0   oxyCODONE  (OXY IR/ROXICODONE ) 5 MG immediate release tablet Take 5 mg by mouth every 6 (six) hours as needed.     OXYGEN  2lpm with sleep only  [facility]     phenazopyridine  (PYRIDIUM ) 100 MG tablet Take 1 tablet (100 mg total) by mouth 3 (three) times daily as needed for pain. 10 tablet 1   predniSONE  (DELTASONE ) 10 MG tablet Take 1 tablet (10  mg total) by mouth daily with breakfast. 3 tabs by mouth per day for 3 days,2 tabs per day for 3 days,1 tab per day for 3 days (Patient not taking: Reported on 12/15/2023) 18 tablet 0   primidone  (MYSOLINE ) 50 MG tablet TAKE 1 TABLET BY MOUTH IN THE  MORNING AND 3 TABLETS BY MOUTH  AT BEDTIME 360 tablet 3   rosuvastatin  (CRESTOR ) 40 MG tablet TAKE 1 TABLET BY MOUTH DAILY 100 tablet 2   spironolactone  (ALDACTONE ) 25 MG tablet TAKE 1 TABLET BY MOUTH TWICE  DAILY 200 tablet 2   tirzepatide  (MOUNJARO ) 2.5 MG/0.5ML Pen Inject 2.5 mg into the skin once a week. 2 mL 1   No current facility-administered medications on file prior to visit.    ALLERGIES: Allergies  Allergen Reactions   Cymbalta  [Duloxetine  Hcl]     Altered mental state / agitated     FAMILY HISTORY: Family History  Problem Relation Age of Onset   Cancer Mother        melanoma    Heart disease Mother    High blood pressure Mother    Stroke Mother    Thyroid  disease Mother    Anxiety disorder Mother    Colon polyps Sister    High Cholesterol Sister    Psoriasis Brother    Osteoarthritis Maternal Grandmother 15   Scoliosis Maternal Grandmother     Cancer Maternal Grandfather        prostate cancer   Colon cancer Other    Alcohol abuse Other    Arthritis Other    Hypertension Other    Esophageal cancer Neg Hx    Rectal cancer Neg Hx    Stomach cancer Neg Hx       Objective:  Blood pressure 136/71, pulse 80, height 5' 1 (1.549 m), weight 288 lb (130.6 kg), SpO2 94%. General: No acute distress.  Patient appears well-groomed.   Head:  Normocephalic/atraumatic Eyes:  Fundi examined but not visualized Neck: supple, no paraspinal tenderness, full range of motion Heart:  Regular rate and rhythm Lungs:  Clear to auscultation bilaterally Back: No paraspinal tenderness Neurological Exam: alert and oriented.  Speech fluent and not dysarthric, language intact.  CN II-XII intact. Bulk and tone normal, muscle strength 5/5 throughout.  Sensation to pinprick slightly reduced in feet, vibratory sensation intact.  Deep tendon reflexes 1+ throughout, toes downgoing.  Finger to nose testing intact.  Gait normal, Romberg negative.   Juliene Dunnings, DO  CC: Lynwood Rush, MD

## 2024-01-06 ENCOUNTER — Encounter: Payer: Self-pay | Admitting: Neurology

## 2024-01-06 ENCOUNTER — Ambulatory Visit: Admitting: Neurology

## 2024-01-06 ENCOUNTER — Other Ambulatory Visit: Payer: Self-pay

## 2024-01-06 VITALS — BP 136/71 | HR 80 | Ht 61.0 in | Wt 288.0 lb

## 2024-01-06 DIAGNOSIS — E039 Hypothyroidism, unspecified: Secondary | ICD-10-CM

## 2024-01-06 DIAGNOSIS — G250 Essential tremor: Secondary | ICD-10-CM | POA: Diagnosis not present

## 2024-01-06 DIAGNOSIS — E1142 Type 2 diabetes mellitus with diabetic polyneuropathy: Secondary | ICD-10-CM

## 2024-01-06 MED ORDER — LEVOTHYROXINE SODIUM 25 MCG PO TABS: ORAL_TABLET | ORAL | 2 refills | Status: AC

## 2024-01-06 MED ORDER — PRIMIDONE 50 MG PO TABS: ORAL_TABLET | ORAL | 3 refills | Status: AC

## 2024-01-06 MED ORDER — GABAPENTIN 300 MG PO CAPS
600.0000 mg | ORAL_CAPSULE | Freq: Every day | ORAL | 3 refills | Status: DC
Start: 2024-01-06 — End: 2024-05-04

## 2024-01-06 MED ORDER — LEVOTHYROXINE SODIUM 200 MCG PO TABS: ORAL_TABLET | ORAL | 2 refills | Status: AC

## 2024-01-06 NOTE — Patient Instructions (Signed)
 Change to generic gabapentin  300mg  - take 2 capsules (600mg ) at bedtime Continue primidone  50mg  in morning and 150mg  at bedtime

## 2024-01-12 ENCOUNTER — Ambulatory Visit (INDEPENDENT_AMBULATORY_CARE_PROVIDER_SITE_OTHER): Admitting: Internal Medicine

## 2024-01-12 ENCOUNTER — Encounter: Payer: Self-pay | Admitting: Internal Medicine

## 2024-01-12 VITALS — BP 120/64 | HR 94 | Ht 61.0 in | Wt 284.6 lb

## 2024-01-12 DIAGNOSIS — E282 Polycystic ovarian syndrome: Secondary | ICD-10-CM | POA: Diagnosis not present

## 2024-01-12 DIAGNOSIS — E66813 Obesity, class 3: Secondary | ICD-10-CM | POA: Diagnosis not present

## 2024-01-12 DIAGNOSIS — E1165 Type 2 diabetes mellitus with hyperglycemia: Secondary | ICD-10-CM | POA: Diagnosis not present

## 2024-01-12 DIAGNOSIS — E039 Hypothyroidism, unspecified: Secondary | ICD-10-CM | POA: Diagnosis not present

## 2024-01-12 LAB — POCT GLYCOSYLATED HEMOGLOBIN (HGB A1C): Hemoglobin A1C: 8.2 % — AB (ref 4.0–5.6)

## 2024-01-12 MED ORDER — TIRZEPATIDE 2.5 MG/0.5ML ~~LOC~~ SOAJ: 2.5000 mg | SUBCUTANEOUS | 1 refills | Status: DC

## 2024-01-12 NOTE — Progress Notes (Signed)
 Patient ID: Autumn Massey, female   DOB: May 16, 1966, 58 y.o.   MRN: [id number]  HPI  Autumn Massey is a 58 y.o.-year-old female, returning for f/u for DM2, hypothyroidism, and PCOS. Last visit 6 months ago.  Interim history: She was referred to a plastic surgeon to remove her abdominal pannus.  She needs to have a BMI <45 before surgery.  Also, HbA1c has been lower than 8.  She was offered gastric bypass, but she refused.  She is now going to the weight management clinic. No increased urination, blurry vision, chest pain. She went to the emergency room for nausea/vomiting/diarrhea 11/28/2023 (was dehydrated).  Lipase was negative then.  The symptoms resolved.  She does not feel that this episode was related to Ozempic  as she missed few doses prior to this. She also was off for a colonoscopy last month. She was on Prednisone  1-2 mo ago (taper) - bronchitis. Sugars were higher then.  PCOS: Reviewed history:  - weight gain: Maximal weight 364  - was on Phentermine , which worked initially, then stopped working  - Would consider gastric bypass but only as a last resort - + hirsutism >> started to see facial hair in her 30s - + hair loss >> female pattern, and bald spot in vertex - No acne - had a Mirena IUD placed 04/2013 for heavy menstrual cycles >> out now; no menses since IUD out - has frequent Prednisone  tapers    She was previously on spironolactone  50 mg twice daily but her potassium increased so we had to decrease the dose to 25 mg twice daily.  We ended up stopping spironolactone  11/2016.  However, she started to have more facial hair and more frontal balding so we restarted spironolactone  25 mg twice a day.  Her potassium level is normal now: Lab Results  Component Value Date   K 3.4 (L) 12/30/2023   Hypothyroidism: Pt. has been dx with hypothyroidism in 2005.  Pt is on levothyroxine  (crushed) 225 mcg daily, taken: - in am - fasting - at least 3h from b'fast - no Ca, Fe - +  MVI 2h later! >> moved at night - no PPIs - not on Biotin  Reviewed her TFTs: Lab Results  Component Value Date   TSH 1.96 07/09/2023   TSH 6.17 (H) 02/26/2023   TSH 3.270 03/11/2022   TSH 2.55 07/10/2021   TSH 4.06 03/15/2021   TSH 21.97 (H) 12/06/2020   TSH 13.74 (H) 10/03/2020   TSH 42.400 (H) 09/19/2020   TSH 71.60 (H) 01/27/2020   TSH 0.80 10/20/2019   FREET4 1.4 07/09/2023   FREET4 0.99 02/26/2023   FREET4 1.40 03/11/2022   FREET4 1.02 03/15/2021   FREET4 0.70 12/06/2020   FREET4 0.90 10/03/2020   FREET4 0.68 (L) 09/19/2020   FREET4 1.17 10/20/2019   FREET4 1.23 03/01/2019   FREET4 1.21 07/26/2018    Pt denies: - feeling nodules in neck - hoarseness - dysphagia - choking  She was on Neurontin  for tremors, but had to decrease the dose because of somnolence and then she was able to stop. She is on primidone  >> restarted by Dr. Skeet.  She was on Lyrica  at night >> switched to Gabapentin .  DM2: Reviewed HbA1c levels: Lab Results  Component Value Date   HGBA1C 7.3 (A) 07/09/2023   HGBA1C 8.1 (H) 05/25/2023   HGBA1C 7.0 (H) 01/26/2023  04/26/2019: HbA1c calculated from fructosamine is 5.4% 03/09/2018: HbA1c calculated from fructosamine is better, at 5.5%! 09/02/2017: HbA1c calculated  from Fructosamine is 6.1%. 05/05/2017: HbA1c calculated from Fructosamine is 6.00%. 03/10/2017: HbA1c calculated from Fructosamine is 6.17% (great!). 11/2016: HbA1c calculated from the fructosamine is radically better, at 5.77%!  She is on: - Metformin  1000 mg with dinner >> 500 mg with dinner >> in am - Jardiance  10 mg in the pm -  started 02/2017 >> initially stopped b/c UTI and also had a tear on her labia >> resolved >> now 25 mg daily - Ozempic  0.25 >> 0.5 mg weekly >> weight plateaued, increased hunger >> stopped >> now:  Not taking Januvia  >> not covered. She was previously on glipizide .  She was checking sugars 1-2 times a day: - am: 114-138 >> 120-125 >> 100-110 >> 153,  169 >> 108-128 - before b'fast: 100-110 - after b'fast: 153 >> n/c  - lunch: n/c >> 98-191 >> n/c - after lunch: n/c >> 123 >> 141, 170s >> 119-140s >> 130-151 - dinner: 120-140, 157 >> n/c >> 94-114 >> n/c - after dinner: 149-173 >> 142-168 >> n/c >> 120-140s >> 140-180 Highest CBG: 230 (b'day cake + iceceam) >> 140 >> 169 >> 180  No CKD: Lab Results  Component Value Date   BUN 10 12/30/2023   Lab Results  Component Value Date   CREATININE 0.57 12/30/2023   Lab Results  Component Value Date   MICRALBCREAT 31 (H) 01/27/2020  She was taken off Irbesartan  >> taken off by PCP b/c low BP  + HL: Lab Results  Component Value Date   CHOL 156 01/26/2023   HDL 38.50 (L) 01/26/2023   LDLCALC 54 03/11/2022   LDLDIRECT 79.0 01/26/2023   TRIG 281.0 (H) 01/26/2023   CHOLHDL 4 01/26/2023  She is on Crestor  40 mg daily.  She had an eye exam - 06/30/2023: No DR reportedly.  She had a foot exam 07/29/2023.  She was on extended release gabapentin  in the past ([employer] ) but this is now not covered by her insurance so she is on instant release Neurontin .  She quit smoking 05/2015.  She had a D&C on 04/28/2019.  ROS: + See HPI + Tremors   I reviewed pt's medications, allergies, PMH, social hx, family hx, and changes were documented in the history of present illness. Otherwise, unchanged from my initial visit note.  She does not have a family history of medullary thyroid  cancer or personal history of pancreatitis.  Past Medical History:  Diagnosis Date   Anxiety    ANXIETY DEPRESSION 01/29/2009   04/26/2019- no current   Arthritis    Asthma    CHF (congestive heart failure) (HCC)    DEPRESSION 11/19/2007   Diabetes (HCC)    Dyspnea    FREQUENCY, URINARY 10/17/2009   HOH (hard of hearing)    HYPERLIPIDEMIA 11/19/2007   HYPERTENSION 11/19/2007   HYPOTHYROIDISM 11/19/2007   Morbid obesity (HCC)    Neuromuscular disorder (HCC)    Neuropathy 01/26/2019   Neuropathy    Oxygen   deficiency    PNEUMONIA 08/06/2010   Pneumonia    POLYCYTHEMIA 01/29/2009   Preventative health care 04/06/2011   Sleep apnea    mild sleep apnea, on o2 at 2l at nighttime   SLEEP RELATED HYPOVENTILATION/HYPOXEMIA CCE 02/08/2009   Venous insufficiency 07/19/2012   Past Surgical History:  Procedure Laterality Date   ANTERIOR CERVICAL CORPECTOMY N/A 06/19/2021   Procedure: Cerivcal Six Corpectomy;  Surgeon: Gillie Duncans, MD;  Location: [facility] OR;  Service: Neurosurgery;  Laterality: N/A;   COLONOSCOPY N/A 12/21/2023   Procedure:  COLONOSCOPY;  Surgeon: Stacia Glendia BRAVO, MD;  Location: [facility];  Service: Gastroenterology;  Laterality: N/A;   DILITATION & CURRETTAGE/HYSTROSCOPY WITH NOVASURE ABLATION N/A 04/28/2019   Procedure: DILATATION & CURETTAGE/HYSTEROSCOPY WITH NOVASURE ABLATION;  Surgeon: Johnnye Ade, MD;  Location[facility] OR;  Service: Gynecology;  Laterality: N/A;  [employer] rep will be here confirmed on 04/20/19   TONSILLECTOMY     ULNAR TUNNEL RELEASE Left 02/22/2013   Procedure: LEFT ULNAR NERVE DECOMPRESSION ;  Surgeon: Franky JONELLE Curia, MD;  Location: [facility];  Service: Orthopedics;  Laterality: Left;   Social History   Socioeconomic History   Marital status: Divorced    Spouse name: Not on file   Number of children: 0   Years of education: 12   Highest education level: GED or equivalent  Occupational History   Occupation: Disabled    Employer: [employer]  Tobacco Use   Smoking status: Every Day    Current packs/day: 0.50    Average packs/day: 0.5 packs/day for 22.0 years (11.0 ttl pk-yrs)    Types: Cigarettes   Smokeless tobacco: Never  Vaping Use   Vaping status: Never Used  Substance and Sexual Activity   Alcohol use: Not Currently    Comment: rare   Drug use: No   Sexual activity: Not Currently  Other Topics Concern   Not on file  Social History Narrative   Patient is right-handed.   Lives alone   Social Drivers of Health    Financial Resource Strain: Low Risk  (07/29/2023)   Overall Financial Resource Strain (CARDIA)    Difficulty of Paying Living Expenses: Not hard at all  Recent Concern: Financial Resource Strain - Medium Risk (06/29/2023)   Overall Financial Resource Strain (CARDIA)    Difficulty of Paying Living Expenses: Somewhat hard  Food Insecurity: No Food Insecurity (07/29/2023)   Hunger Vital Sign    Worried About Running Out of Food in the Last Year: Never true    Ran Out of Food in the Last Year: Never true  Transportation Needs: No Transportation Needs (07/29/2023)   PRAPARE - Administrator, Civil Service (Medical): No    Lack of Transportation (Non-Medical): No  Physical Activity: Inactive (07/29/2023)   Exercise Vital Sign    Days of Exercise per Week: 0 days    Minutes of Exercise per Session: 20 min  Stress: No Stress Concern Present (07/29/2023)   [employer] of Occupational Health - Occupational Stress Questionnaire    Feeling of Stress : Only a little  Social Connections: Moderately Isolated (07/29/2023)   Social Connection and Isolation Panel    Frequency of Communication with Friends and Family: More than three times a week    Frequency of Social Gatherings with Friends and Family: Once a week    Attends Religious Services: 1 to 4 times per year    Active Member of [employer] or Organizations: No    Attends Banker Meetings: Never    Marital Status: Divorced  Catering manager Violence: Patient Unable To Answer (06/29/2023)   Humiliation, Afraid, Rape, and Kick questionnaire    Fear of Current or Ex-Partner: Patient unable to answer    Emotionally Abused: Patient unable to answer    Physically Abused: Patient unable to answer    Sexually Abused: Patient unable to answer   Current Outpatient Medications on File Prior to Visit  Medication Sig Dispense Refill   ACCU-CHEK GUIDE TEST test strip USE AS INSTRUCTED TO CHECK BLOOD  SUGAR TWICE DAILY 200 strip 2    Accu-Chek Softclix Lancets lancets USE AS INSTRUCTED TO CHECK BLOOD SUGAR ONCE DAILY 100 each 2   albuterol  (PROVENTIL ) (2.5 MG/3ML) 0.083% nebulizer solution Take 3 mLs (2.5 mg total) by nebulization every 6 (six) hours as needed for wheezing or shortness of breath. 75 mL 1   albuterol  (VENTOLIN  HFA) 108 (90 Base) MCG/ACT inhaler USE 2 INHALATIONS BY MOUTH EVERY 6 HOURS AS NEEDED 34 g 2   aspirin  EC 81 MG tablet Take 81 mg by mouth daily.     Azelastine -Fluticasone  137-50 MCG/ACT SUSP Place 1 spray into the nose every 12 (twelve) hours. 23 g 0   bisoprolol -hydrochlorothiazide  (ZIAC ) 5-6.25 MG tablet TAKE 1 TABLET BY MOUTH DAILY 100 tablet 2   Blood Glucose Monitoring Suppl (ACCU-CHEK GUIDE) w/Device KIT Use as instructed to check blood sugar 2X daily 1 kit 0   BREO ELLIPTA  100-25 MCG/ACT AEPB USE 1 INHALATION BY MOUTH ONCE  DAILY AT THE SAME TIME EACH DAY 180 each 3   cholecalciferol  (VITAMIN D3) 25 MCG (1000 UNIT) tablet Take 1,000 Units by mouth daily. (Patient taking differently: Take 2,000 Units by mouth daily.)     clonazePAM  (KLONOPIN ) 0.5 MG tablet Take 1 tablet (0.5 mg total) by mouth 2 (two) times daily as needed for anxiety. 60 tablet 5   cyclobenzaprine  (FLEXERIL ) 5 MG tablet TAKE 1 TABLET BY MOUTH 3  TIMES DAILY AS NEEDED FOR  MUSCLE SPASM(S) (Patient taking differently: Take 5 mg by mouth as needed. TAKE 1 TABLET BY MOUTH 3  TIMES DAILY AS NEEDED FOR  MUSCLE SPASM(S)) 270 tablet 1   Diclofenac  Sodium (PENNSAID ) 2 % SOLN Place 2 g onto the skin 2 (two) times daily. (Patient taking differently: Place 2 g onto the skin as needed.) 112 g 3   empagliflozin  (JARDIANCE ) 25 MG TABS tablet Take 1 tablet (25 mg total) by mouth daily before breakfast. 30 tablet 5   FLUoxetine  (PROZAC ) 40 MG capsule TAKE 1 CAPSULE BY MOUTH DAILY 100 capsule 2   furosemide  (LASIX ) 40 MG tablet TAKE 1 TABLET BY MOUTH DAILY (Patient taking differently: Take 40 mg by mouth as needed. TAKE 1 TABLET BY MOUTH DAILY) 90  tablet 1   gabapentin  (NEURONTIN ) 300 MG capsule Take 2 capsules (600 mg total) by mouth at bedtime. 180 capsule 3   Lancets Misc. (ACCU-CHEK FASTCLIX LANCET) KIT Use to check blood sugar once a day 1 kit 0   levothyroxine  (SYNTHROID ) 200 MCG tablet TAKE 1 TABLET BY MOUTH  DAILY 100 tablet 2   levothyroxine  (SYNTHROID ) 25 MCG tablet TAKE 1 TABLET BY MOUTH DAILY  BEFORE BREAKFAST WITH 200 MCG  TABLET FOR A TOTAL DAILY DOSE OF 225 MCG 100 tablet 2   metFORMIN  (GLUCOPHAGE -XR) 500 MG 24 hr tablet TAKE 1 TABLET BY MOUTH DAILY  WITH BREAKFAST 100 tablet 2   montelukast  (SINGULAIR ) 10 MG tablet TAKE 1 TABLET BY MOUTH AT  BEDTIME 100 tablet 2   Multiple Vitamin (MULTIVITAMIN WITH MINERALS) TABS tablet Take 1 tablet by mouth daily.     ondansetron  (ZOFRAN -ODT) 4 MG disintegrating tablet 4mg  ODT q4 hours prn nausea/vomit (Patient taking differently: as needed. 4mg  ODT q4 hours prn nausea/vomit) 20 tablet 0   oxyCODONE  (OXY IR/ROXICODONE ) 5 MG immediate release tablet Take 5 mg by mouth every 6 (six) hours as needed.     OXYGEN  2lpm with sleep only  [facility]     phenazopyridine  (PYRIDIUM ) 100 MG tablet Take 1 tablet (100 mg  total) by mouth 3 (three) times daily as needed for pain. 10 tablet 1   primidone  (MYSOLINE ) 50 MG tablet TAKE 1 TABLET BY MOUTH IN THE  MORNING AND 3 TABLETS BY MOUTH  AT BEDTIME 360 tablet 3   rosuvastatin  (CRESTOR ) 40 MG tablet TAKE 1 TABLET BY MOUTH DAILY 100 tablet 2   spironolactone  (ALDACTONE ) 25 MG tablet TAKE 1 TABLET BY MOUTH TWICE  DAILY 200 tablet 2   tirzepatide  (MOUNJARO ) 2.5 MG/0.5ML Pen Inject 2.5 mg into the skin once a week. (Patient not taking: Reported on 01/06/2024) 2 mL 1   No current facility-administered medications on file prior to visit.   Allergies  Allergen Reactions   Cymbalta  [Duloxetine  Hcl]     Altered mental state / agitated    Family History  Problem Relation Age of Onset   Cancer Mother        melanoma    Heart disease Mother    High blood pressure  Mother    Stroke Mother    Thyroid  disease Mother    Anxiety disorder Mother    Colon polyps Sister    High Cholesterol Sister    Psoriasis Brother    Osteoarthritis Maternal Grandmother 26   Scoliosis Maternal Grandmother    Cancer Maternal Grandfather        prostate cancer   Colon cancer Other    Alcohol abuse Other    Arthritis Other    Hypertension Other    Esophageal cancer Neg Hx    Rectal cancer Neg Hx    Stomach cancer Neg Hx    PE: BP 120/64   Pulse 94   Ht 5' 1 (1.549 m)   Wt 284 lb 9.6 oz (129.1 kg)   SpO2 91%   BMI 53.77 kg/m  Wt Readings from Last 10 Encounters:  01/12/24 284 lb 9.6 oz (129.1 kg)  01/06/24 288 lb (130.6 kg)  12/30/23 290 lb (131.5 kg)  12/18/23 278 lb (126.1 kg)  12/15/23 278 lb (126.1 kg)  11/28/23 283 lb 1.1 oz (128.4 kg)  10/13/23 283 lb (128.4 kg)  07/29/23 282 lb (127.9 kg)  07/28/23 282 lb (127.9 kg)  07/09/23 286 lb 3.2 oz (129.8 kg)   Constitutional: overweight, in NAD Eyes: no exophthalmos ENT: no thyromegaly, no cervical lymphadenopathy Cardiovascular: tachycardia, RR, No MRG Respiratory: CTA B Musculoskeletal: no deformities Skin:  + stasis dermatitis rashes on lower legs R>L Neurological: no tremor with outstretched hands  PLAN:  1. Patient with longstanding hypothyroidism -Previously uncontrolled -Last year, she had very high TSH levels, 71 and 42 respectively, despite a high dose of levothyroxine .  I was not aware of these results, but I became aware of her TSH from 09/2020 which improved to 13.7.  Afterwards, we discussed about the need for compliance and also advised her to crush the levothyroxine  tablet, after which her TFTs normalized, but at last visit, the level was again elevated: - latest thyroid  labs reviewed with pt. >> normal: Lab Results  Component Value Date   TSH 1.96 07/09/2023  - she continues on LT4 225 mcg daily (crushed) - pt feels good on this dose. - we discussed about taking the thyroid   hormone every day, with water, >30 minutes before breakfast, separated by >4 hours from acid reflux medications, calcium , iron, multivitamins. Pt. is taking it correctly.  2. PCOS -She aged out her PCOS diagnosis -However, she continues to have increased facial hair and alopecia and continue spironolactone  25 mg twice a day, as  she feels that this is helping -Latest potassium level was not elevated: Lab Results  Component Value Date   K 3.4 (L) 12/30/2023  -We cannot increase spironolactone  dose further due to previous hyperkalemia at the higher doses  3. DM2 - Patient with suboptimally controlled type 2 diabetes, on oral medication with metformin , SGLT2 inhibitor and also weekly GLP-1  receptor agonist.  She previously came off Ozempic  due to the significantly decreased appetite and not being able to eat consistently.  However, before last visit, sugars were slightly higher than before and she was on a lower dose of Ozempic , started by Dr. Midge, on which she was doing well.  Her weight plateaued afterwards and she started to be hungry again so she stopped Ozempic .  She was interested in starting Mounjaro  at that time.  I did advise her that if this was not approved, to increase the Ozempic  dose.  We continued the rest of the regimen. - At today's visit, she tells me she was not able to start Mounjaro  but planning to start now, after being approved by her insurance. She is on Ozempic  - missed few doses since last OV.  She had an episode of nausea, vomiting and diarrhea, but she did not attribute this to Ozempic  but to gastroenteritis with dehydration.  The symptoms resolved. -Reportedly, sugars are at goal during the day, so I am surprised about the recent higher, at 8.2%. Will go ahead with  - I suggested: Patient Instructions  Please continue: - Metformin  ER 500 mg with breakfast - Jardiance  25 mg before breakfast   Switch from Ozempic  to  - Mounjaro  2.5 mg weekly. If you tolerate it  well, increase to 5 mg weekly.   Please continue the crushed levothyroxine  225 g daily.   Take the thyroid  hormone every day, with water, at least 30 minutes before breakfast, separated by at least 4 hours from: - acid reflux medications - calcium  - iron - multivitamins   Continue spironolactone  25 mg twice a day.   Please return in 4 months.  - we checked her HbA1c: 8.2% (higher) - advised to check sugars at different times of the day - 1x a day, rotating check times - advised for yearly eye exams >> she is UTD - will check an ACR today - we discussed about different factors that can influence this.  She just had a CMP, and we discussed that this can influence the result.  She was taken off irbesartan  by PCP due to low blood pressure but she is planning to discuss with him to have this restarted. - return to clinic in 4 months  4.  Obesity class III - She continues on Jardiance  which should also help with weight loss - She was previously on Ozempic  but came off due to the decreased appetite - In the past, she was doing a partially keto diet with intermittent fasting 16-8 - She gained a significant amount of weight during her COVID infection at the beginning of 2022 - Afterwards, she started hand weights and chair exercises - She then started to see the weight management clinic and lost 12 pounds - She is planning to have abdominal pannus surgery but BMI needs to be lower than 45 - She is now seen in the weight management clinic - she lost a net 2 pounds since last visit - will try again to start Mounjaro  and increase the dose as tolerated.  Lela Fendt, MD PhD [facility]

## 2024-01-12 NOTE — Addendum Note (Signed)
 Addended by: CLEOTILDE ROLIN RAMAN on: 01/12/2024 01:33 PM   Modules accepted: Orders

## 2024-01-12 NOTE — Patient Instructions (Addendum)
 Please continue: - Metformin  ER 500 mg with breakfast - Jardiance  25 mg before breakfast   Switch from Ozempic  to  - Mounjaro  2.5 mg weekly. If you tolerate it well, increase to 5 mg weekly.   Please continue the crushed levothyroxine  225 g daily.   Take the thyroid  hormone every day, with water, at least 30 minutes before breakfast, separated by at least 4 hours from: - acid reflux medications - calcium  - iron - multivitamins   Continue spironolactone  25 mg twice a day.   Please return in 4 months.

## 2024-01-13 ENCOUNTER — Ambulatory Visit: Payer: Self-pay | Admitting: Internal Medicine

## 2024-01-13 LAB — MICROALBUMIN / CREATININE URINE RATIO
Creatinine, Urine: 33 mg/dL (ref 20–275)
Microalb Creat Ratio: 236 mg/g{creat} — ABNORMAL HIGH (ref <30)
Microalb, Ur: 7.8 mg/dL

## 2024-01-26 ENCOUNTER — Ambulatory Visit (INDEPENDENT_AMBULATORY_CARE_PROVIDER_SITE_OTHER): Payer: Medicare Other | Admitting: Internal Medicine

## 2024-01-26 ENCOUNTER — Telehealth: Payer: Self-pay

## 2024-01-26 VITALS — BP 124/72 | HR 85 | Temp 98.2°F | Ht 61.0 in | Wt 285.8 lb

## 2024-01-26 DIAGNOSIS — E1159 Type 2 diabetes mellitus with other circulatory complications: Secondary | ICD-10-CM

## 2024-01-26 DIAGNOSIS — Z7984 Long term (current) use of oral hypoglycemic drugs: Secondary | ICD-10-CM | POA: Diagnosis not present

## 2024-01-26 DIAGNOSIS — E039 Hypothyroidism, unspecified: Secondary | ICD-10-CM | POA: Diagnosis not present

## 2024-01-26 DIAGNOSIS — E1165 Type 2 diabetes mellitus with hyperglycemia: Secondary | ICD-10-CM | POA: Diagnosis not present

## 2024-01-26 DIAGNOSIS — E7849 Other hyperlipidemia: Secondary | ICD-10-CM

## 2024-01-26 DIAGNOSIS — Z7985 Long-term (current) use of injectable non-insulin antidiabetic drugs: Secondary | ICD-10-CM | POA: Diagnosis not present

## 2024-01-26 DIAGNOSIS — E538 Deficiency of other specified B group vitamins: Secondary | ICD-10-CM | POA: Diagnosis not present

## 2024-01-26 DIAGNOSIS — Z23 Encounter for immunization: Secondary | ICD-10-CM | POA: Diagnosis not present

## 2024-01-26 DIAGNOSIS — I152 Hypertension secondary to endocrine disorders: Secondary | ICD-10-CM

## 2024-01-26 DIAGNOSIS — E559 Vitamin D deficiency, unspecified: Secondary | ICD-10-CM | POA: Diagnosis not present

## 2024-01-26 MED ORDER — IRBESARTAN 150 MG PO TABS: 150.0000 mg | ORAL_TABLET | Freq: Every day | ORAL | 3 refills | Status: AC

## 2024-01-26 NOTE — Assessment & Plan Note (Signed)
 Last vitamin D Lab Results  Component Value Date   VD25OH 47.22 01/26/2023   Stable, cont oral replacement

## 2024-01-26 NOTE — Telephone Encounter (Signed)
Ok this was done 

## 2024-01-26 NOTE — Assessment & Plan Note (Signed)
 Lab Results  Component Value Date   HGBA1C 8.2 (A) 01/12/2024   Uncontrolled with persistent obesity, now on starting mounjaro , tolerating well, hopefull for further wt loss soon, pt to continue current medical treatment jardiance  25 every day, metformin  ER 500 mg qd

## 2024-01-26 NOTE — Assessment & Plan Note (Signed)
 Lab Results  Component Value Date   VITAMINB12 682 01/26/2023   Stable, cont oral replacement - b12 1000 mcg qd

## 2024-01-26 NOTE — Progress Notes (Signed)
 Patient ID: Autumn Massey, female   DOB: 1965/09/08, 58 y.o.   MRN: [id number]        Chief Complaint: follow up HTN, HLD, DM, low vit d and b12       HPI:  Autumn Massey is a 58 y.o. female here overall doing ok, Pt denies chest pain, increased sob or doe, wheezing, orthopnea, PND, increased LE swelling, palpitations, dizziness or syncope.   Pt denies polydipsia, polyuria, or new focal neuro s/s.   Did have recent change ozempic  to mounajro 2.5 mg and tolerating well, with plan to titrate.   Trying to get back to 175 lbs at home, has appt next wk aug 5 with healthy wt and wellness, and endo f/u in oct.  Due for prevnar 20  Wt Readings from Last 3 Encounters:  01/26/24 285 lb 12.8 oz (129.6 kg)  01/12/24 284 lb 9.6 oz (129.1 kg)  01/06/24 288 lb (130.6 kg)   BP Readings from Last 3 Encounters:  01/26/24 124/72  01/12/24 120/64  01/06/24 136/71         Past Medical History:  Diagnosis Date   Anxiety    ANXIETY DEPRESSION 01/29/2009   04/26/2019- no current   Arthritis    Asthma    CHF (congestive heart failure) (HCC)    COPD (chronic obstructive pulmonary disease) (HCC) 2014   DEPRESSION 11/19/2007   Diabetes (HCC)    Dyspnea    FREQUENCY, URINARY 10/17/2009   HOH (hard of hearing)    HYPERLIPIDEMIA 11/19/2007   HYPERTENSION 11/19/2007   HYPOTHYROIDISM 11/19/2007   Morbid obesity (HCC)    Neuromuscular disorder (HCC)    Neuropathy 01/26/2019   Neuropathy    Oxygen  deficiency    PNEUMONIA 08/06/2010   Pneumonia    POLYCYTHEMIA 01/29/2009   Preventative health care 04/06/2011   Sleep apnea    mild sleep apnea, on o2 at 2l at nighttime   SLEEP RELATED HYPOVENTILATION/HYPOXEMIA CCE 02/08/2009   Venous insufficiency 07/19/2012   Past Surgical History:  Procedure Laterality Date   ANTERIOR CERVICAL CORPECTOMY N/A 06/19/2021   Procedure: Cerivcal Six Corpectomy;  Surgeon: Gillie Duncans, MD;  Location: [facility] OR;  Service: Neurosurgery;  Laterality: N/A;   COLONOSCOPY N/A  12/21/2023   Procedure: COLONOSCOPY;  Surgeon: Stacia Glendia BRAVO, MD;  Location: [facility];  Service: Gastroenterology;  Laterality: N/A;   DILITATION & CURRETTAGE/HYSTROSCOPY WITH NOVASURE ABLATION N/A 04/28/2019   Procedure: DILATATION & CURETTAGE/HYSTEROSCOPY WITH NOVASURE ABLATION;  Surgeon: Johnnye Ade, MD;  Location: [facility] OR;  Service: Gynecology;  Laterality: N/A;  [employer] rep will be here confirmed on 04/20/19   TONSILLECTOMY     ULNAR TUNNEL RELEASE Left 02/22/2013   Procedure: LEFT ULNAR NERVE DECOMPRESSION ;  Surgeon: Franky JONELLE Curia, MD;  Location: [facility];  Service: Orthopedics;  Laterality: Left;    reports that she has been smoking cigarettes. She has a 11 pack-year smoking history. She has never used smokeless tobacco. She reports that she does not currently use alcohol. She reports that she does not use drugs. family history includes Alcohol abuse in an other family member; Anxiety disorder in her mother and sister; Arthritis in her maternal grandmother, mother, and another family member; Asthma in her brother; COPD in her mother; Cancer in her maternal grandfather and mother; Colon cancer in an other family member; Colon polyps in her sister; Depression in her sister; Hearing loss in her maternal grandmother and mother; Heart disease in her mother; High Cholesterol in her  sister; High blood pressure in her mother; Hypertension in her maternal grandmother, mother, and another family member; Osteoarthritis (age of onset: 40) in her maternal grandmother; Psoriasis in her brother; Scoliosis in her maternal grandmother; Stroke in her mother; Thyroid  disease in her mother. Allergies  Allergen Reactions   Cymbalta  [Duloxetine  Hcl]     Altered mental state / agitated    Current Outpatient Medications on File Prior to Visit  Medication Sig Dispense Refill   ACCU-CHEK GUIDE TEST test strip USE AS INSTRUCTED TO CHECK BLOOD SUGAR TWICE DAILY 200 strip 2   Accu-Chek  Softclix Lancets lancets USE AS INSTRUCTED TO CHECK BLOOD SUGAR ONCE DAILY 100 each 2   albuterol  (PROVENTIL ) (2.5 MG/3ML) 0.083% nebulizer solution Take 3 mLs (2.5 mg total) by nebulization every 6 (six) hours as needed for wheezing or shortness of breath. 75 mL 1   albuterol  (VENTOLIN  HFA) 108 (90 Base) MCG/ACT inhaler USE 2 INHALATIONS BY MOUTH EVERY 6 HOURS AS NEEDED 34 g 2   aspirin  EC 81 MG tablet Take 81 mg by mouth daily.     Azelastine -Fluticasone  137-50 MCG/ACT SUSP Place 1 spray into the nose every 12 (twelve) hours. 23 g 0   Blood Glucose Monitoring Suppl (ACCU-CHEK GUIDE) w/Device KIT Use as instructed to check blood sugar 2X daily 1 kit 0   BREO ELLIPTA  100-25 MCG/ACT AEPB USE 1 INHALATION BY MOUTH ONCE  DAILY AT THE SAME TIME EACH DAY 180 each 3   cholecalciferol  (VITAMIN D3) 25 MCG (1000 UNIT) tablet Take 1,000 Units by mouth daily. (Patient taking differently: Take 2,000 Units by mouth daily.)     clonazePAM  (KLONOPIN ) 0.5 MG tablet Take 1 tablet (0.5 mg total) by mouth 2 (two) times daily as needed for anxiety. 60 tablet 5   cyclobenzaprine  (FLEXERIL ) 5 MG tablet TAKE 1 TABLET BY MOUTH 3  TIMES DAILY AS NEEDED FOR  MUSCLE SPASM(S) (Patient taking differently: Take 5 mg by mouth as needed. TAKE 1 TABLET BY MOUTH 3  TIMES DAILY AS NEEDED FOR  MUSCLE SPASM(S)) 270 tablet 1   Diclofenac  Sodium (PENNSAID ) 2 % SOLN Place 2 g onto the skin 2 (two) times daily. (Patient taking differently: Place 2 g onto the skin as needed.) 112 g 3   empagliflozin  (JARDIANCE ) 25 MG TABS tablet Take 1 tablet (25 mg total) by mouth daily before breakfast. 30 tablet 5   FLUoxetine  (PROZAC ) 40 MG capsule TAKE 1 CAPSULE BY MOUTH DAILY 100 capsule 2   furosemide  (LASIX ) 40 MG tablet TAKE 1 TABLET BY MOUTH DAILY (Patient taking differently: Take 40 mg by mouth as needed. TAKE 1 TABLET BY MOUTH DAILY) 90 tablet 1   gabapentin  (NEURONTIN ) 300 MG capsule Take 2 capsules (600 mg total) by mouth at bedtime. 180 capsule  3   Lancets Misc. (ACCU-CHEK FASTCLIX LANCET) KIT Use to check blood sugar once a day 1 kit 0   levothyroxine  (SYNTHROID ) 200 MCG tablet TAKE 1 TABLET BY MOUTH  DAILY 100 tablet 2   levothyroxine  (SYNTHROID ) 25 MCG tablet TAKE 1 TABLET BY MOUTH DAILY  BEFORE BREAKFAST WITH 200 MCG  TABLET FOR A TOTAL DAILY DOSE OF 225 MCG 100 tablet 2   metFORMIN  (GLUCOPHAGE -XR) 500 MG 24 hr tablet TAKE 1 TABLET BY MOUTH DAILY  WITH BREAKFAST 100 tablet 2   montelukast  (SINGULAIR ) 10 MG tablet TAKE 1 TABLET BY MOUTH AT  BEDTIME 100 tablet 2   Multiple Vitamin (MULTIVITAMIN WITH MINERALS) TABS tablet Take 1 tablet by mouth daily.  ondansetron  (ZOFRAN -ODT) 4 MG disintegrating tablet 4mg  ODT q4 hours prn nausea/vomit (Patient taking differently: as needed. 4mg  ODT q4 hours prn nausea/vomit) 20 tablet 0   oxyCODONE  (OXY IR/ROXICODONE ) 5 MG immediate release tablet Take 5 mg by mouth every 6 (six) hours as needed.     OXYGEN  2lpm with sleep only  [facility]     phenazopyridine  (PYRIDIUM ) 100 MG tablet Take 1 tablet (100 mg total) by mouth 3 (three) times daily as needed for pain. 10 tablet 1   primidone  (MYSOLINE ) 50 MG tablet TAKE 1 TABLET BY MOUTH IN THE  MORNING AND 3 TABLETS BY MOUTH  AT BEDTIME 360 tablet 3   rosuvastatin  (CRESTOR ) 40 MG tablet TAKE 1 TABLET BY MOUTH DAILY 100 tablet 2   spironolactone  (ALDACTONE ) 25 MG tablet TAKE 1 TABLET BY MOUTH TWICE  DAILY 200 tablet 2   tirzepatide  (MOUNJARO ) 2.5 MG/0.5ML Pen Inject 2.5 mg into the skin once a week. 2 mL 1   No current facility-administered medications on file prior to visit.        ROS:  All others reviewed and negative.  Objective        PE:  BP 124/72   Pulse 85   Temp 98.2 F (36.8 C)   Ht 5' 1 (1.549 m)   Wt 285 lb 12.8 oz (129.6 kg)   SpO2 94%   BMI 54.00 kg/m                 Constitutional: Pt appears in NAD               HENT: Head: NCAT.                Right Ear: External ear normal.                 Left Ear: External ear normal.                 Eyes: . Pupils are equal, round, and reactive to light. Conjunctivae and EOM are normal               Nose: without d/c or deformity               Neck: Neck supple. Gross normal ROM               Cardiovascular: Normal rate and regular rhythm.                 Pulmonary/Chest: Effort normal and breath sounds without rales or wheezing.                Abd:  Soft, NT, ND, + BS, no organomegaly               Neurological: Pt is alert. At baseline orientation, motor grossly intact               Skin: Skin is warm. No rashes, no other new lesions, LE edema - none               Psychiatric: Pt behavior is normal without agitation   Micro: none  Cardiac tracings I have personally interpreted today:  none  Pertinent Radiological findings (summarize): none   Lab Results  Component Value Date   WBC 8.3 12/30/2023   HGB 15.5 (H) 12/30/2023   HCT 47.8 (H) 12/30/2023   PLT 149 (L) 12/30/2023   GLUCOSE 103 (H) 12/30/2023   CHOL 156 01/26/2023   TRIG 281.0 (H) 01/26/2023  HDL 38.50 (L) 01/26/2023   LDLDIRECT 79.0 01/26/2023   LDLCALC 54 03/11/2022   ALT 12 12/30/2023   AST 18 12/30/2023   NA 136 12/30/2023   K 3.4 (L) 12/30/2023   CL 100 12/30/2023   CREATININE 0.57 12/30/2023   BUN 10 12/30/2023   CO2 26 12/30/2023   TSH 1.96 07/09/2023   HGBA1C 8.2 (A) 01/12/2024   MICROALBUR 7.8 01/12/2024   Assessment/Plan:  Autumn Massey is a 58 y.o. White or Caucasian [1] female with  has a past medical history of Anxiety, ANXIETY DEPRESSION (01/29/2009), Arthritis, Asthma, CHF (congestive heart failure) (HCC), COPD (chronic obstructive pulmonary disease) (HCC) (2014), DEPRESSION (11/19/2007), Diabetes (HCC), Dyspnea, FREQUENCY, URINARY (10/17/2009), HOH (hard of hearing), HYPERLIPIDEMIA (11/19/2007), HYPERTENSION (11/19/2007), HYPOTHYROIDISM (11/19/2007), Morbid obesity (HCC), Neuromuscular disorder (HCC), Neuropathy (01/26/2019), Neuropathy, Oxygen  deficiency, PNEUMONIA (08/06/2010),  Pneumonia, POLYCYTHEMIA (01/29/2009), Preventative health care (04/06/2011), Sleep apnea, SLEEP RELATED HYPOVENTILATION/HYPOXEMIA CCE (02/08/2009), and Venous insufficiency (07/19/2012).  Vitamin D  deficiency Last vitamin D  Lab Results  Component Value Date   VD25OH 47.22 01/26/2023   Stable, cont oral replacement   Vitamin B12 deficiency Lab Results  Component Value Date   VITAMINB12 682 01/26/2023   Stable, cont oral replacement - b12 1000 mcg qd   Type 2 diabetes mellitus with hyperglycemia, without long-term current use of insulin  (HCC) Lab Results  Component Value Date   HGBA1C 8.2 (A) 01/12/2024   Uncontrolled with persistent obesity, now on starting mounjaro , tolerating well, hopefull for further wt loss soon, pt to continue current medical treatment jardiance  25 every day, metformin  ER 500 mg qd   Hypothyroidism Lab Results  Component Value Date   TSH 1.96 07/09/2023   Stable, pt to continue levothyroxine  225 mcg qd   Hypertension associated with diabetes (HCC) BP Readings from Last 3 Encounters:  01/26/24 124/72  01/12/24 120/64  01/06/24 136/71   Stable, pt to continue medical treatment avapro  150 qd   HLD (hyperlipidemia) Lab Results  Component Value Date   LDLCALC 54 03/11/2022   Stable, pt to continue current statin crestor  40 qd  Followup: Return in about 6 months (around 07/28/2024).  Lynwood Rush, MD 01/26/2024 9:05 PM [facility]

## 2024-01-26 NOTE — Patient Instructions (Addendum)
 You had the Prevnar 20 pneumonia shot today  Ok to change the Ziac  to the irbesartan  160 mg per day  Please continue all other medications as before, and refills have been done if requested.  Please have the pharmacy call with any other refills you may need.  Please continue your efforts at being more active, low cholesterol diet, and weight control.  Please keep your appointments with your specialists as you may have planned - endocrinology in 3 months  We can hold on further lab testing today  Please make an Appointment to return in 6 months, or sooner if needed

## 2024-01-26 NOTE — Progress Notes (Signed)
 Autumn Massey, D.O.  ABFM, ABOM Specializing in Clinical Bariatric Medicine  Office located at: [address] [place], [place]  [address]    - A live video visit was scheduled and initiated for today's office visit due to acute left lower leg pain and swelling. - I connected with current patient today and verified that I am speaking with the correct person using 2 identifiers  Location of the patient: Work office  Location of the provider (and scribe): Office - This telemedicine ( live audio and visual utilized ) visit type was conducted due to national recommendations for restrictions regarding symptomatic patients in the midst of the current COVID-19 Pandemic (e.g. social distancing).  It was done in an effort to limit this patient's exposure and/or mitigate transmission in our community.    - No physical exam could be performed with this format, beyond that communicated to us  by the patient/ family members as noted.   - Additionally members of the medical staff at [facility] discussed the limitations of evaluation and management by telemedicine with the patient and that there may be a monetary charge related to this service, depending on their medical insurance. The patient expressed understanding, and agreed to proceed.    Assessment and Plan:   Medications Discontinued During This Encounter  Medication Reason   Semaglutide ,0.25 or 0.5MG /DOS, (OZEMPIC , 0.25 OR 0.5 MG/DOSE,) 2 MG/3ML SOPN      FOR THE DISEASE OF OBESITY:  Morbid obesity (HCC): Starting BMI 54.4 Assessment & Plan: No bariatric information was obtained today.   Recommended Dietary Goals Autumn Massey is currently in the action stage of change. As such, her goal is to continue weight management plan.  She has agreed to: continue current plan   Behavioral Intervention We discussed the following today: increasing lean protein intake to established goals and identifying sources and decreasing  liquid calories, avoid skipping meals, sources of lean protein, importance of eating on plan and eating all her protein portion at each meal.   Additional resources provided today: None  Evidence-based interventions for health behavior change were utilized today including the discussion of self monitoring techniques, problem-solving barriers and SMART goal setting techniques.   Regarding patient's less desirable eating habits and patterns, we employed the technique of small changes.   Pt will specifically work on: n/a    Recommended Physical Activity Goals Autumn Massey has been advised to work up to 300-450 minutes of moderate intensity aerobic activity a week and strengthening exercises 2-3 times per week for cardiovascular health, weight loss maintenance and preservation of muscle mass.   She has agreed to: Continue current level of physical activity    Pharmacotherapy We both agreed to: Continue with current nutritional and behavioral strategies and continue Jardiance  and Metformin .   ASSOCIATED CONDITIONS ADDRESSED TODAY:  Type 2 diabetes mellitus with obesity [facility]) Assessment & Plan: Lab Results  Component Value Date   HGBA1C 7.3 (A) 07/09/2023   HGBA1C 8.1 (H) 05/25/2023   INSULIN  7.5 03/11/2022   Currently on Jardiance  25 mg daily and Metformin  500 mg daily. Had colonoscopy on 12/21/23; skipped one dose of Jardiance  per colonoscopy prep and took her next dose the following day on 12/22/23. Stopped Ozempic  2 weeks prior to colonoscopy. Of note, Dr. Trixie ordered Mounjaro  on 07/09/23 and she will be restarting this soon. Will soon be switching from Ozempic  to Mounjaro  as per Dr. Trixie of endocrinology.   Pt monitoring sugars at home; not daily. Currently ranging 106-147. Reports  a blood glucose reading of 121 the morning of her colonoscopy. The week following her colonoscopy, her readings were averaging 120s in the AM and <160 at nighttime.   Pt has a f/u with Dr. Trixie on 7/15 and  will likely be getting labs including an A1c. Also seeing Dr. Norleen, her PCP, on 7/29.  Continue on Jardiance  and Metformin  as prescribed. Advised pt to follow up with Dr. Trixie and follow the the medication regimen that will be prescribed. Continue prioritizing lean proteins ar each meal. Will continue monitoring condition alongside her PCP/specialist.     Hypertension associated with diabetes [facility]) Assessment & Plan: BP Readings from Last 3 Encounters:  01/26/24 124/72  01/12/24 120/64  01/06/24 136/71   Well controlled on Spironolactone  25 mg BID. BP has been running in the 120s/60-70s at home. Reports her highest reading to 138/80s.   Continue current antihypertensive regimen as prescribed. No changes made. Continue to follow up with PCP/specialists. Follow heart healthy diet per her meal plan and continue exercising regularly.     Hyperlipidemia associated with type 2 diabetes mellitus (HCC) Assessment & Plan: Relevant medications: Rosuvastatin  40 mg once daily. Tolerating well. No adverse SE reported.   Continue statin therapy as prescribed. Follow low-cholesterol diet per her meal plan. Continue regular exercise. Will continue monitoring alongside PCP/specialist.     Polycythemia Assessment & Plan: Ongoing for several years, as far back as 2010. Hemoglobin and hematocrit have been waxing/waning. Hemoglobin was around 16-17 in early 2010s. Pt reports acute worsening swelling, redness, and temperature changes to the left lower extremity. She states her leg is only hot in a specific area.   Given her current symptoms are acute in nature and a hx of hyperviscosity state, pt will be proceeding to the ED to rule out blood clot to her left lower extremity.     Chronic diastolic congestive heart failure (HCC) Assessment & Plan: Last seen several years ago by cardiology. Had Echo on 09/2020 with Dr. Barbaraann. Results showed LVEF of 50-60%. No regional wall motion abnormalities.  There was mild LVH notes. Left ventricular diastolic parameters were consistent with Grade I diastolic function (impaired relaxation). Otherwise normal exam.   Pt stopped her Lasix  on her own around 12/15/23. She has since only been taking prn. She continues to f/u with her PCP for management. If swelling persists after evaluation at the ED, I highly recommend she f/u with her PCP to discuss further tx plans.     Follow up:   Return for keep planned ov in approx one mo.  She was informed of the importance of frequent follow up visits to maximize her success with intensive lifestyle modifications for her multiple health conditions.  Subjective:   Chief complaint: Obesity Autumn Massey is here to discuss her progress with her obesity treatment plan. She is on the Category 3 Plan w 100 snack calories and B/L options. She states she is doing hand weights (20 reps 3 sets) 2 days week and chair lifts 15-20 minutes 2 days per week.   Interval History:  Autumn Massey is here for a follow up office visit. Since last OV on 10/13/23, she has struggled to follow her meal plan. Has been drinking [employer] milk protein drinks with sugar-free chocolate syrup and PB2. Last couple days she has gotten better at eating more protein. With the recent intense heat, she has mainly wanting cold foods and finds it more difficult to eat warm meals. She has been eating salads with 8  ounces of chicken.   Lowest wt at home 274 lbs prior to colonoscopy on 6/23. Was instructed to hold Jardiance  on 6/19 for colonoscopy prep. Resumed Jardiance  on 6/24. Stopped Ozempic  2 weeks prior to colonoscopy and was planning to resume this week. She did not resume Ozempic  due to experiencing acute diarrhea and loose stools.   She complains of several complications since her colonoscopy: acute diarrhea, dysuria without burning sensation, and LLE swelling, redness, and temperature changes.  Pharmacotherapy that aid with weight loss: She is  currently taking Metformin  500 mg daily and Jardiance  25 mg daily   Review of Systems:  Pertinent positives were addressed with patient today.  Reviewed by clinician on day of visit: allergies, medications, problem list, medical history, surgical history, family history, social history, and previous encounter notes.  Weight Summary and Biometrics   No bariatric information was obtained today.    Vitals Temp: 98.2 F (36.8 C) BP: 124/72 Pulse Rate: 85 SpO2: 94 %   Anthropometric Measurements Height: 5' 1 (1.549 m) Weight: 285 lb 12.8 oz (129.6 kg) BMI (Calculated): 54.03   No data recorded No data recorded  Objective:   PHYSICAL EXAM: There were no vitals taken for this visit. There is no height or weight on file to calculate BMI.  General: she is overweight, cooperative and in no acute distress. PSYCH: Has normal mood, affect and thought process.   HEENT: EOMI, sclerae are anicteric. Lungs: Normal breathing effort, no conversational dyspnea. Extremities: Moves * 4 Neurologic: A and O * 3, good insight  DIAGNOSTIC DATA REVIEWED: BMET    Component Value Date/Time   NA 136 12/30/2023 1716   NA 137 03/11/2022 1554   K 3.4 (L) 12/30/2023 1716   CL 100 12/30/2023 1716   CO2 26 12/30/2023 1716   GLUCOSE 103 (H) 12/30/2023 1716   BUN 10 12/30/2023 1716   BUN 11 03/11/2022 1554   CREATININE 0.57 12/30/2023 1716   CREATININE 0.73 03/02/2018 1157   CALCIUM  8.8 (L) 12/30/2023 1716   GFRNONAA >60 12/30/2023 1716   GFRNONAA 95 03/02/2018 1157   GFRAA >60 04/26/2019 1445   GFRAA 110 03/02/2018 1157   Lab Results  Component Value Date   HGBA1C 8.2 (A) 01/12/2024   HGBA1C 5.6 03/15/2012   Lab Results  Component Value Date   INSULIN  7.5 03/11/2022   Lab Results  Component Value Date   TSH 1.96 07/09/2023   CBC    Component Value Date/Time   WBC 8.3 12/30/2023 1716   RBC 5.00 12/30/2023 1716   HGB 15.5 (H) 12/30/2023 1716   HGB 19.6 (H) 03/11/2022 1554    HCT 47.8 (H) 12/30/2023 1716   HCT 57.0 (H) 03/11/2022 1554   PLT 149 (L) 12/30/2023 1716   PLT 167 03/11/2022 1554   MCV 95.6 12/30/2023 1716   MCV 92 03/11/2022 1554   MCH 31.0 12/30/2023 1716   MCHC 32.4 12/30/2023 1716   RDW 13.7 12/30/2023 1716   RDW 12.1 03/11/2022 1554   Iron Studies    Component Value Date/Time   IRON 93 01/26/2019 0959   FERRITIN 124.5 01/29/2009 0905   IRONPCTSAT 31.3 01/26/2019 0959   Lipid Panel     Component Value Date/Time   CHOL 156 01/26/2023 1503   CHOL 130 03/11/2022 1554   TRIG 281.0 (H) 01/26/2023 1503   HDL 38.50 (L) 01/26/2023 1503   HDL 34 (L) 03/11/2022 1554   CHOLHDL 4 01/26/2023 1503   VLDL 56.2 (H) 01/26/2023 1503  LDLCALC 54 03/11/2022 1554   LDLCALC 109 (H) 01/27/2020 1006   LDLDIRECT 79.0 01/26/2023 1503   Hepatic Function Panel     Component Value Date/Time   PROT 6.7 12/30/2023 1716   PROT 6.9 03/11/2022 1554   ALBUMIN  3.3 (L) 12/30/2023 1716   ALBUMIN  4.3 03/11/2022 1554   AST 18 12/30/2023 1716   ALT 12 12/30/2023 1716   ALKPHOS 58 12/30/2023 1716   BILITOT 0.6 12/30/2023 1716   BILITOT 0.7 03/11/2022 1554   BILIDIR 0.1 01/26/2023 1503   IBILI 0.4 01/27/2020 1006      Component Value Date/Time   TSH 1.96 07/09/2023 1553   Nutritional Lab Results  Component Value Date   VD25OH 47.22 01/26/2023   VD25OH 83.22 09/11/2022   VD25OH 39.5 03/11/2022    Attestations:   I, Vernell Forest, acting as a medical scribe for Autumn Jenkins, DO., have compiled all relevant documentation for today's office visit on behalf of Autumn Jenkins, DO, while in the presence of [employer], DO.  Reviewed by clinician on day of visit: allergies, medications, problem list, medical history, surgical history, family history, social history, and previous encounter notes pertinent to patient's obesity diagnosis.  I have spent 48 minutes in the care of the patient today. This includes extensive review of patient chart  pre/post visit, especially with her acute sx of increase in size, redness, and increased temperatures of her left lower extremity. This also includes assessing and reviewing listed medical problems above as outlined in office visit note and providing nutritional and behavioral counseling as outlined in obesity care plan.   I have reviewed the above documentation for accuracy and completeness, and I agree with the above. Autumn JINNY Massey, D.O.  The 21st Century Cures Act was signed into law in 2016 which includes the topic of electronic health records.  This provides immediate access to information in MyChart.  This includes consultation notes, operative notes, office notes, lab results and pathology reports.  If you have any questions about what you read please let us  know at your next visit so we can discuss your concerns and take corrective action if need be.  We are right here with you.

## 2024-01-26 NOTE — Assessment & Plan Note (Signed)
 Lab Results  Component Value Date   TSH 1.96 07/09/2023   Stable, pt to continue levothyroxine 225 mcg qd

## 2024-01-26 NOTE — Assessment & Plan Note (Signed)
 Lab Results  Component Value Date   LDLCALC 54 03/11/2022   Stable, pt to continue current statin crestor  40 qd

## 2024-01-26 NOTE — Assessment & Plan Note (Signed)
 BP Readings from Last 3 Encounters:  01/26/24 124/72  01/12/24 120/64  01/06/24 136/71   Stable, pt to continue medical treatment avapro  150 qd

## 2024-01-26 NOTE — Telephone Encounter (Signed)
 Good afternoon!  Patient forgot to bring up changing the bisoprolol -hydrochlorothiazide  for the irbesartan . Was suggested this by both her healthy [facility] and wellness provider as well as endocrinology

## 2024-01-28 NOTE — Telephone Encounter (Signed)
 Called and confirmed with patient that prescription had been placed. They informed me that their pharmacy did receive this script

## 2024-02-02 ENCOUNTER — Ambulatory Visit (INDEPENDENT_AMBULATORY_CARE_PROVIDER_SITE_OTHER): Admitting: Family Medicine

## 2024-02-02 ENCOUNTER — Encounter (INDEPENDENT_AMBULATORY_CARE_PROVIDER_SITE_OTHER): Payer: Self-pay | Admitting: Family Medicine

## 2024-02-02 VITALS — BP 129/75 | HR 77 | Temp 98.4°F | Ht 61.0 in | Wt 280.0 lb

## 2024-02-02 DIAGNOSIS — E1169 Type 2 diabetes mellitus with other specified complication: Secondary | ICD-10-CM | POA: Diagnosis not present

## 2024-02-02 DIAGNOSIS — I152 Hypertension secondary to endocrine disorders: Secondary | ICD-10-CM | POA: Diagnosis not present

## 2024-02-02 DIAGNOSIS — E1159 Type 2 diabetes mellitus with other circulatory complications: Secondary | ICD-10-CM

## 2024-02-02 DIAGNOSIS — Z7984 Long term (current) use of oral hypoglycemic drugs: Secondary | ICD-10-CM

## 2024-02-02 DIAGNOSIS — E65 Localized adiposity: Secondary | ICD-10-CM

## 2024-02-02 DIAGNOSIS — Z7985 Long-term (current) use of injectable non-insulin antidiabetic drugs: Secondary | ICD-10-CM

## 2024-02-02 DIAGNOSIS — Z6841 Body Mass Index (BMI) 40.0 and over, adult: Secondary | ICD-10-CM

## 2024-02-02 DIAGNOSIS — E669 Obesity, unspecified: Secondary | ICD-10-CM

## 2024-02-02 NOTE — Progress Notes (Signed)
 Autumn Massey, D.O.  ABFM, ABOM Specializing in Clinical Bariatric Medicine  Office located at: [address] [place], [place]  [address]   Assessment and Plan:   FOR THE DISEASE OF OBESITY:  BMI 50.0-59.9, adult (HCC)-current BMI 52.93 Morbid obesity (HCC): Starting BMI 54.4 Assessment & Plan: Since last in person office 10/13/2023, patient's muscle mass has decreased by 5.8 lbs. Fat mass has increased by 3.6 lbs.  Body fat % has increased by 1.8  %. Counseling done on how various foods will affect these numbers and how to maximize success  Total lbs lost to date: - 8 lbs Total weight loss percentage to date: -2.78 %   Recommended Dietary Goals Autumn Massey is currently in the action stage of change. As such, her goal is to continue weight management plan.  She has agreed to START journaling 1650-1750 calories and 110++ grams protein daily using the Cat 3 MP with 100 snack calories and B/L options as a guide.    Behavioral Intervention We discussed the following today: strategies to eat healthy in social gatherings, increasing lean protein intake to established goals, decreasing simple carbohydrates , work on tracking and journaling calories using tracking application, and continue to work on implementation of reduced calorie nutritional plan  Additional resources provided today: Handout on Examples of Low Glycemic Index and Low Calorie Fruits & Vegetables and Handout on Daily Food Journaling Log   Evidence-based interventions for health behavior change were utilized today including the discussion of self monitoring techniques, problem-solving barriers and SMART goal setting techniques.   Regarding patient's less desirable eating habits and patterns, we employed the technique of small changes.   Goal: weigh her lean proteins + bring in food log   Recommended Physical Activity Goals Autumn Massey has been advised to work up to 300-450 minutes of moderate intensity aerobic activity a  week and strengthening exercises 2-3 times per week for cardiovascular health, weight loss maintenance and preservation of muscle mass.   She has agreed to: continue to gradually increase the amount and intensity of exercise routine   Pharmacotherapy See T2DM note.    ASSOCIATED CONDITIONS ADDRESSED TODAY:   Type 2 diabetes mellitus with obesity [facility]) Assessment & Plan: Lab Results  Component Value Date   HGBA1C 8.2 (A) 01/12/2024   HGBA1C 7.3 (A) 07/09/2023   HGBA1C 8.1 (H) 05/25/2023   INSULIN  7.5 03/11/2022    She is currently on Metformin  XR 500 mg daily, Jardiance  25 mg daily, and Mounjaro  2.5 mg weekly. She started the Mounjaro  2 days or so after her appointment with Dr.Gerghe on 01/12/2024. She is on her 4th shot and denies GI issues. Of note, her Hemoglobin A1c was checked with Dr.Gerghe and it was elevated at 8.2. 1-2 months prior, pt was on a prednisone  taper for bronchitis which is probably contributing to her A1c being higher than where it would normally be.   - Continue all antidiabetic medications.  - Continue to decrease simple carbs/ sugars; increase/measure proteins -> specific journaling parameters were provided today. - Advance exercise as tolerated. - F/up with Dr.Gerghe as directed.    Hypertension associated with diabetes (HCC) Assessment & Plan: Last 3 blood pressure readings in our office are as follows: BP Readings from Last 3 Encounters:  02/02/24 129/75  01/26/24 124/72  01/12/24 120/64   The 10-year ASCVD risk score (Arnett DK, et al., 2019) is: 15.9%  Lab Results  Component Value Date   CREATININE 0.57 12/30/2023   Blood pressure is  controlled on Spironolactone  25 mg twice daily and Avapro  150 mg daily. No acute concerns.   - Continue adherence to antihypertensives. - Continue low sodium.  - Advance exercise as able.     Visceral obesity Assessment & Plan: Current visceral fat rating: 24.  The visceral fat rating should be < 12 in a  female.  - Visceral adipose tissue is a hormonally active component of total body fat. This body composition phenotype is associated with medical disorders such as metabolic syndrome, MASLD, cardiovascular disease and several malignancies including prostate, breast, and colorectal cancers. - Goal: Lose 7-10% of weight via prudent nutritional plan and lifestyle changes.     Follow up:   Return 03/08/2024 12:30 PM with [name], [name], PA-C.  She was informed of the importance of frequent follow up visits to maximize her success with intensive lifestyle modifications for her multiple health conditions.   Subjective:   Chief complaint: Obesity Autumn Massey is here to discuss her progress with her obesity treatment plan. She is on the Category 3 Plan with 100 snack calories and B/L options. and states she is following her eating plan approximately 80% of the time. She states she is doing hand weights 15-20 minutes 3+ days per week .   Interval History:  Autumn Massey is here for a follow up office visit. Since her video visit on 12/30/2023, she is down 3 lbs. She had a colonoscopy and a GI bug for a couple days and under-ate during those days. Once she recovered, she admits to not getting in enough lean protein on some days.  She sometimes journals her intakes and averages 1200-1350 calories and 125-140 grams protein daily.   Pharmacotherapy that aid with weight loss: She is currently taking Mounjaro  2.5 mg weekly, Metformin  XR 500 mg daily, and Jardiance  25 mg daily. She was intolerant to Ozempic  due to GI upset.   Review of Systems:  Pertinent positives were addressed with patient today.  Reviewed by clinician on day of visit: allergies, medications, problem list, medical history, surgical history, family history, social history, and previous encounter notes.  Weight Summary and Biometrics   Weight Lost Since Last Visit: 3 lb  Weight Gained Since Last Visit: 0   Vitals Temp:  98.4 F (36.9 C) BP: 129/75 Pulse Rate: 77 SpO2: 93 %   Anthropometric Measurements Height: 5' 1 (1.549 m) Weight: 280 lb (127 kg) BMI (Calculated): 52.93 Weight at Last Visit: 283 lb Weight Lost Since Last Visit: 3 lb Weight Gained Since Last Visit: 0 Starting Weight: 288 lb Total Weight Loss (lbs): 8 lb (3.629 kg)   Body Composition  Body Fat %: 58.7 % Fat Mass (lbs): 164.8 lbs Muscle Mass (lbs): 110 lbs Visceral Fat Rating : 24   No data recorded  Objective:   PHYSICAL EXAM: Blood pressure 129/75, pulse 77, temperature 98.4 F (36.9 C), height 5' 1 (1.549 m), weight 280 lb (127 kg), SpO2 93%. Body mass index is 52.91 kg/m.  General: she is overweight, cooperative and in no acute distress. PSYCH: Has normal mood, affect and thought process.   HEENT: EOMI, sclerae are anicteric. Lungs: Normal breathing effort, no conversational dyspnea. Extremities: Moves * 4 Neurologic: A and O * 3, good insight  DIAGNOSTIC DATA REVIEWED: BMET    Component Value Date/Time   NA 136 12/30/2023 1716   NA 137 03/11/2022 1554   K 3.4 (L) 12/30/2023 1716   CL 100 12/30/2023 1716   CO2 26 12/30/2023 1716   GLUCOSE  103 (H) 12/30/2023 1716   BUN 10 12/30/2023 1716   BUN 11 03/11/2022 1554   CREATININE 0.57 12/30/2023 1716   CREATININE 0.73 03/02/2018 1157   CALCIUM  8.8 (L) 12/30/2023 1716   GFRNONAA >60 12/30/2023 1716   GFRNONAA 95 03/02/2018 1157   GFRAA >60 04/26/2019 1445   GFRAA 110 03/02/2018 1157   Lab Results  Component Value Date   HGBA1C 8.2 (A) 01/12/2024   HGBA1C 5.6 03/15/2012   Lab Results  Component Value Date   INSULIN  7.5 03/11/2022   Lab Results  Component Value Date   TSH 1.96 07/09/2023   CBC    Component Value Date/Time   WBC 8.3 12/30/2023 1716   RBC 5.00 12/30/2023 1716   HGB 15.5 (H) 12/30/2023 1716   HGB 19.6 (H) 03/11/2022 1554   HCT 47.8 (H) 12/30/2023 1716   HCT 57.0 (H) 03/11/2022 1554   PLT 149 (L) 12/30/2023 1716   PLT 167  03/11/2022 1554   MCV 95.6 12/30/2023 1716   MCV 92 03/11/2022 1554   MCH 31.0 12/30/2023 1716   MCHC 32.4 12/30/2023 1716   RDW 13.7 12/30/2023 1716   RDW 12.1 03/11/2022 1554   Iron Studies    Component Value Date/Time   IRON 93 01/26/2019 0959   FERRITIN 124.5 01/29/2009 0905   IRONPCTSAT 31.3 01/26/2019 0959   Lipid Panel     Component Value Date/Time   CHOL 156 01/26/2023 1503   CHOL 130 03/11/2022 1554   TRIG 281.0 (H) 01/26/2023 1503   HDL 38.50 (L) 01/26/2023 1503   HDL 34 (L) 03/11/2022 1554   CHOLHDL 4 01/26/2023 1503   VLDL 56.2 (H) 01/26/2023 1503   LDLCALC 54 03/11/2022 1554   LDLCALC 109 (H) 01/27/2020 1006   LDLDIRECT 79.0 01/26/2023 1503   Hepatic Function Panel     Component Value Date/Time   PROT 6.7 12/30/2023 1716   PROT 6.9 03/11/2022 1554   ALBUMIN  3.3 (L) 12/30/2023 1716   ALBUMIN  4.3 03/11/2022 1554   AST 18 12/30/2023 1716   ALT 12 12/30/2023 1716   ALKPHOS 58 12/30/2023 1716   BILITOT 0.6 12/30/2023 1716   BILITOT 0.7 03/11/2022 1554   BILIDIR 0.1 01/26/2023 1503   IBILI 0.4 01/27/2020 1006      Component Value Date/Time   TSH 1.96 07/09/2023 1553   Nutritional Lab Results  Component Value Date   VD25OH 47.22 01/26/2023   VD25OH 83.22 09/11/2022   VD25OH 39.5 03/11/2022    Attestations:   I, Special Puri, acting as a Stage manager for [employer], DO., have compiled all relevant documentation for today's office visit on behalf of Autumn Jenkins, DO, while in the presence of [employer], DO.  I have spent 48 minutes in the care of the patient today including 39 minutes face-to-face assessing and reviewing listed medical problems above as outlined in office visit note and providing nutritional and behavioral counseling as outlined in obesity care plan.   I have reviewed the above documentation for accuracy and completeness, and I agree with the above. Autumn JINNY Massey, D.O.  The 21st Century Cures Act was signed into  law in 2016 which includes the topic of electronic health records.  This provides immediate access to information in MyChart.  This includes consultation notes, operative notes, office notes, lab results and pathology reports.  If you have any questions about what you read please let us  know at your next visit so we can discuss your concerns and take corrective  action if need be.  We are right here with you.

## 2024-02-08 ENCOUNTER — Other Ambulatory Visit: Payer: Self-pay

## 2024-02-08 ENCOUNTER — Encounter: Payer: Self-pay | Admitting: Internal Medicine

## 2024-02-08 MED ORDER — TIRZEPATIDE 5 MG/0.5ML ~~LOC~~ SOAJ: 5.0000 mg | SUBCUTANEOUS | 3 refills | Status: AC

## 2024-02-08 MED ORDER — FLUTICASONE FUROATE-VILANTEROL 100-25 MCG/ACT IN AEPB
1.0000 | INHALATION_SPRAY | Freq: Every day | RESPIRATORY_TRACT | 0 refills | Status: DC
Start: 2024-02-08 — End: 2024-03-11

## 2024-03-04 ENCOUNTER — Encounter (INDEPENDENT_AMBULATORY_CARE_PROVIDER_SITE_OTHER): Payer: Self-pay | Admitting: Physician Assistant

## 2024-03-04 ENCOUNTER — Encounter (INDEPENDENT_AMBULATORY_CARE_PROVIDER_SITE_OTHER): Payer: Self-pay | Admitting: Family Medicine

## 2024-03-08 ENCOUNTER — Ambulatory Visit (INDEPENDENT_AMBULATORY_CARE_PROVIDER_SITE_OTHER): Admitting: Physician Assistant

## 2024-03-11 ENCOUNTER — Other Ambulatory Visit: Payer: Self-pay | Admitting: Internal Medicine

## 2024-03-16 ENCOUNTER — Encounter (INDEPENDENT_AMBULATORY_CARE_PROVIDER_SITE_OTHER): Payer: Self-pay | Admitting: Family Medicine

## 2024-03-16 ENCOUNTER — Ambulatory Visit (INDEPENDENT_AMBULATORY_CARE_PROVIDER_SITE_OTHER): Admitting: Family Medicine

## 2024-03-16 VITALS — BP 124/74 | HR 84 | Temp 98.3°F | Ht 61.0 in | Wt 269.0 lb

## 2024-03-16 DIAGNOSIS — Z7984 Long term (current) use of oral hypoglycemic drugs: Secondary | ICD-10-CM | POA: Diagnosis not present

## 2024-03-16 DIAGNOSIS — I110 Hypertensive heart disease with heart failure: Secondary | ICD-10-CM | POA: Diagnosis not present

## 2024-03-16 DIAGNOSIS — E65 Localized adiposity: Secondary | ICD-10-CM | POA: Insufficient documentation

## 2024-03-16 DIAGNOSIS — E1159 Type 2 diabetes mellitus with other circulatory complications: Secondary | ICD-10-CM | POA: Diagnosis not present

## 2024-03-16 DIAGNOSIS — Z6841 Body Mass Index (BMI) 40.0 and over, adult: Secondary | ICD-10-CM

## 2024-03-16 DIAGNOSIS — Z7985 Long-term (current) use of injectable non-insulin antidiabetic drugs: Secondary | ICD-10-CM

## 2024-03-16 DIAGNOSIS — E1169 Type 2 diabetes mellitus with other specified complication: Secondary | ICD-10-CM | POA: Diagnosis not present

## 2024-03-16 DIAGNOSIS — I5032 Chronic diastolic (congestive) heart failure: Secondary | ICD-10-CM

## 2024-03-16 NOTE — Progress Notes (Signed)
 Autumn Massey, D.O.  ABFM, ABOM Specializing in Clinical Bariatric Medicine  Office located at: [address] [place], [place]  [address]   Assessment and Plan:   FOR THE DISEASE OF OBESITY:  Morbid obesity (HCC): Starting BMI 54.4; BMI 50.0-59.9, adult (HCC)-current BMI 50.85 Assessment & Plan: Since last office visit on 02/02/2024 patient's muscle mass has increased by 1.2 lbs. Fat mass has decreased by 13 lbs. Body fat % has decreased by 2.3 %. Counseling done on how various foods will affect these numbers and how to maximize success.  Total lbs lost to date: 19 lbs  Total weight loss percentage to date: 6.60 %  - Tracking Calories/Macros: yes, not cal hitting caloric goal, with highest being 1500 Kcal and lowest 1300 Kcal.  - Eating More Whole Foods: no  - Adequate Protein Intake: yes  - Adequate Water Intake: yes  - Skipping Meals: yes  - Sleeping 7-9 Hours/ Night: no  Recommended Dietary Goals Autumn Massey is currently in the action stage of change. As such, her goal is to continue weight management plan.  She has agreed to: switch journal parameters to 1500-1700 calories per day and 110++ grams of protein.    Behavioral Intervention We discussed the following today: decreasing simple carbohydrates (suggested alternative pastas such as Carbonada, Carbediem, or Pete's), increasing vegetables, increasing fiber rich foods, and focusing on food with a 10:1 ratio of calories: grams of protein.  Additional resources provided today: None  Evidence-based interventions for health behavior change were utilized today including the discussion of self monitoring techniques, problem-solving barriers and SMART goal setting techniques.   Regarding patient's less desirable eating habits and patterns, we employed the technique of small changes.   Goal(s) for next OV: N/A   Recommended Physical Activity Goals Autumn Massey has been advised to work up to 300-450 minutes of moderate  intensity aerobic activity a week and strengthening exercises 2-3 times per week for cardiovascular health, weight loss maintenance and preservation of muscle mass.   She has agreed to: Think about enjoyable ways to increase daily physical activity and overcoming barriers to exercise, Increase physical activity in their day and reduce sedentary time (increase NEAT)., Increase volume of physical activity to a goal of 240 minutes a week, and Combine aerobic and strengthening exercises for efficiency and improved cardiometabolic health.   Pharmacotherapy We both agreed to: Adequate clinical response to anti-obesity medication, continue current regimen of Mounjaro  2.5 mg weekly, Metformin  XR 500 mg daily, and Jardiance  25 mg daily. She was intolerant to Ozempic  due to GI upset.   ASSOCIATED CONDITIONS ADDRESSED TODAY:  Hypertension associated with diabetes; Chronic diastolic congestive heart failure (HCC) Assessment & Plan: Managed with Irbesartan  150 mg daily and Spirolactone 25 mg twice daily. Her last ECHO in 09/2020 showed Normal EF 55-60%, Mild LVH, Grade 1 diastolic dysfunction. BP today normo-tensive and has been well-controlled in-offices. She reportedly does still smoke 0.5 PPD, and is compliant with her nightly oxygen . Denies cardiac sx as long as she is compliant with oxygen  use.  Counseled Autumn Massey on pathophysiology of disease and discussed treatment plan, which always includes dietary and lifestyle modification as first line, such as following our low salt, heart healthy meal plan and engaging in a regular exercise program, as well as smoking cessation. Ambulatory blood pressure monitoring encouraged.  Reminded patient that if they ever feel poorly in any way, to check their blood pressure and pulse as well. We will continue to monitor closely alongside PCP/  specialists.  Pt reminded to also f/up with those individuals as instructed by them. We will continue to monitor symptoms as  they relate to the her weight loss journey. BP Readings from Last 3 Encounters:  03/16/24 124/74  02/02/24 129/75  01/26/24 124/72   The 10-year ASCVD risk score (Arnett DK, et al., 2019) is: 14.8%.  Lab Results  Component Value Date   CREATININE 0.57 12/30/2023     Type 2 diabetes mellitus with obesity (HCC) Followed by Dr. Trixie, Endocrinologist. She takes Jardiance  25 mg daily, Metformin  500 mg daily, and Mounjaro  5 mg weekly. She endorses a decreased appetite with Mounjaro , but no adverse side effects. HgbA1c goal <5.7%, Fasting Insulin  =/<5. In July, HgbA1c was significantly elevated, increased 0.9 points from January. She does note that her BG this morning was 123, yesterday 110.   Intensive lifestyle modification including diet, exercise and weight loss are the first line of treatment for diabetes. We extensively discussed the importance of decreasing simple carbs, increasing proteins and how certain foods they eat will affect their blood sugars Reminded Autumn Massey if she feels poorly- check Blood Sugar and Blood Pressure at that time. Hypoglycemia prevention discussed with the patient.  Eat on a regular basis- no skipping or going long periods without eating.     Recommend that any concerns about medicines should be directed at the prescribing provider. Importance of f/up with PCP and all other specialists, as scheduled, was stressed to the patient today  Recheck labs in 3 months if not done at Endo provider / PCP.  Lab Results  Component Value Date   HGBA1C 8.2 (A) 01/12/2024   HGBA1C 7.3 (A) 07/09/2023   HGBA1C 8.1 (H) 05/25/2023   INSULIN  7.5 03/11/2022     Visceral obesity Assessment & Plan: Goal of >12. When she first began with the program her visceral obesity rating was 23, and at its best was 16, but at LOV on 02/02/2024 it was 24. Today, it is 22. Reviewed with patient goal of =/<12, educating her on how visceral obesity correlates with other chronic conditions  (I.e HTN, hepatic steatosis, DM II, HLD, etc) and how improving her rating will decrease her risk factor for developing/worsening those conditions.  Follow up:   Return 04/13/2024 @ 2:00 PM. She was informed of the importance of frequent follow up visits to maximize her success with intensive lifestyle modifications for her multiple health conditions.  Subjective:    Chief complaint: Obesity Autumn Massey is here to discuss her progress with her obesity treatment plan. She is journaling 1650-1750 calories and 110++ grams protein daily using the Cat 3 MP with 100 snack calories and B/L options as a guide and states she is following her eating plan approximately 75% of the time. Pt is doing chair aerobic with weights and walking 15 minutes 2-3 days per week    Interval History:  Autumn Massey is here for a follow up office visit. Pt has experienced a weight loss of 11 LBS since last OV on 02/02/2024.     Pharmacotherapy that aid with weight loss: She is currently taking Mounjaro  2.5 mg weekly, Metformin  XR 500 mg daily, and Jardiance  25 mg daily. She was intolerant to Ozempic  due to GI upset.    Review of Systems:  Pertinent positives were addressed with patient today.  Reviewed by clinician on day of visit: allergies, medications, problem list, medical history, surgical history, family history, social history, and previous encounter notes.  Weight Summary and Biometrics  No data recorded No data recorded   No data recorded No data recorded No data recorded No data recorded  Objective:   PHYSICAL EXAM: Blood pressure 124/74, pulse 84, temperature 98.3 F (36.8 C), height 5' 1 (1.549 m), weight 269 lb (122 kg), SpO2 95%. Body mass index is 50.83 kg/m.  General: she is overweight, cooperative and in no acute distress. PSYCH: Has normal mood, affect and thought process.   HEENT: EOMI, sclerae are anicteric. Lungs: Normal breathing effort, no conversational dyspnea. Extremities:  Moves * 4 Neurologic: A and O * 3, good insight  DIAGNOSTIC DATA REVIEWED: BMET    Component Value Date/Time   NA 136 12/30/2023 1716   NA 137 03/11/2022 1554   K 3.4 (L) 12/30/2023 1716   CL 100 12/30/2023 1716   CO2 26 12/30/2023 1716   GLUCOSE 103 (H) 12/30/2023 1716   BUN 10 12/30/2023 1716   BUN 11 03/11/2022 1554   CREATININE 0.57 12/30/2023 1716   CREATININE 0.73 03/02/2018 1157   CALCIUM  8.8 (L) 12/30/2023 1716   GFRNONAA >60 12/30/2023 1716   GFRNONAA 95 03/02/2018 1157   GFRAA >60 04/26/2019 1445   GFRAA 110 03/02/2018 1157   Lab Results  Component Value Date   HGBA1C 8.2 (A) 01/12/2024   HGBA1C 5.6 03/15/2012   Lab Results  Component Value Date   INSULIN  7.5 03/11/2022   Lab Results  Component Value Date   TSH 1.96 07/09/2023   CBC    Component Value Date/Time   WBC 8.3 12/30/2023 1716   RBC 5.00 12/30/2023 1716   HGB 15.5 (H) 12/30/2023 1716   HGB 19.6 (H) 03/11/2022 1554   HCT 47.8 (H) 12/30/2023 1716   HCT 57.0 (H) 03/11/2022 1554   PLT 149 (L) 12/30/2023 1716   PLT 167 03/11/2022 1554   MCV 95.6 12/30/2023 1716   MCV 92 03/11/2022 1554   MCH 31.0 12/30/2023 1716   MCHC 32.4 12/30/2023 1716   RDW 13.7 12/30/2023 1716   RDW 12.1 03/11/2022 1554   Iron Studies    Component Value Date/Time   IRON 93 01/26/2019 0959   FERRITIN 124.5 01/29/2009 0905   IRONPCTSAT 31.3 01/26/2019 0959   Lipid Panel     Component Value Date/Time   CHOL 156 01/26/2023 1503   CHOL 130 03/11/2022 1554   TRIG 281.0 (H) 01/26/2023 1503   HDL 38.50 (L) 01/26/2023 1503   HDL 34 (L) 03/11/2022 1554   CHOLHDL 4 01/26/2023 1503   VLDL 56.2 (H) 01/26/2023 1503   LDLCALC 54 03/11/2022 1554   LDLCALC 109 (H) 01/27/2020 1006   LDLDIRECT 79.0 01/26/2023 1503   Hepatic Function Panel     Component Value Date/Time   PROT 6.7 12/30/2023 1716   PROT 6.9 03/11/2022 1554   ALBUMIN  3.3 (L) 12/30/2023 1716   ALBUMIN  4.3 03/11/2022 1554   AST 18 12/30/2023 1716   ALT  12 12/30/2023 1716   ALKPHOS 58 12/30/2023 1716   BILITOT 0.6 12/30/2023 1716   BILITOT 0.7 03/11/2022 1554   BILIDIR 0.1 01/26/2023 1503   IBILI 0.4 01/27/2020 1006      Component Value Date/Time   TSH 1.96 07/09/2023 1553   Nutritional Lab Results  Component Value Date   VD25OH 47.22 01/26/2023   VD25OH 83.22 09/11/2022   VD25OH 39.5 03/11/2022    Attestations:   I, Damien Blanks, acting as a Stage manager for Autumn Jenkins, DO., have compiled all relevant documentation for today's office visit on behalf of Autumn  Autumn Leser, DO, while in the presence of Roczen Waymire, DO.  I have reviewed the above documentation for accuracy and completeness, and I agree with the above. Autumn Autumn Massey, D.O.  The 21st Century Cures Act was signed into law in 2016 which includes the topic of electronic health records.  This provides immediate access to information in MyChart.  This includes consultation notes, operative notes, office notes, lab results and pathology reports.  If you have any questions about what you read please let us  know at your next visit so we can discuss your concerns and take corrective action if need be.  We are right here with you.

## 2024-04-04 ENCOUNTER — Encounter: Payer: Self-pay | Admitting: Internal Medicine

## 2024-04-13 ENCOUNTER — Ambulatory Visit (INDEPENDENT_AMBULATORY_CARE_PROVIDER_SITE_OTHER): Admitting: Family Medicine

## 2024-04-18 ENCOUNTER — Other Ambulatory Visit: Payer: Self-pay | Admitting: Internal Medicine

## 2024-04-18 ENCOUNTER — Other Ambulatory Visit: Payer: Self-pay

## 2024-04-18 ENCOUNTER — Other Ambulatory Visit: Payer: Self-pay | Admitting: Neurology

## 2024-04-18 DIAGNOSIS — E1165 Type 2 diabetes mellitus with hyperglycemia: Secondary | ICD-10-CM

## 2024-04-19 ENCOUNTER — Ambulatory Visit: Admitting: Internal Medicine

## 2024-04-27 ENCOUNTER — Ambulatory Visit (INDEPENDENT_AMBULATORY_CARE_PROVIDER_SITE_OTHER): Admitting: Family Medicine

## 2024-04-27 ENCOUNTER — Encounter (INDEPENDENT_AMBULATORY_CARE_PROVIDER_SITE_OTHER): Payer: Self-pay | Admitting: Family Medicine

## 2024-04-27 VITALS — BP 135/81 | HR 65 | Temp 98.2°F | Ht 61.0 in | Wt 269.0 lb

## 2024-04-27 DIAGNOSIS — E669 Obesity, unspecified: Secondary | ICD-10-CM

## 2024-04-27 DIAGNOSIS — E1159 Type 2 diabetes mellitus with other circulatory complications: Secondary | ICD-10-CM

## 2024-04-27 DIAGNOSIS — F430 Acute stress reaction: Secondary | ICD-10-CM | POA: Diagnosis not present

## 2024-04-27 DIAGNOSIS — Z7985 Long-term (current) use of injectable non-insulin antidiabetic drugs: Secondary | ICD-10-CM

## 2024-04-27 DIAGNOSIS — Z7984 Long term (current) use of oral hypoglycemic drugs: Secondary | ICD-10-CM

## 2024-04-27 DIAGNOSIS — I152 Hypertension secondary to endocrine disorders: Secondary | ICD-10-CM

## 2024-04-27 DIAGNOSIS — E65 Localized adiposity: Secondary | ICD-10-CM

## 2024-04-27 DIAGNOSIS — Z6841 Body Mass Index (BMI) 40.0 and over, adult: Secondary | ICD-10-CM

## 2024-04-27 NOTE — Progress Notes (Signed)
 Barnie DOROTHA Jenkins, D.O.  ABFM, ABOM Specializing in Clinical Bariatric Medicine  Office located at: [address] [place], [place]  [address]    FOR THE CHRONIC DISEASE OF OBESITY:   Morbid obesity (HCC): Starting BMI 54.4; BMI 50.0-59.9, adult (HCC)-current BMI 50.85  Weight Summary and Body Composition Analysis  Weight Lost Since Last Visit: 0  Weight Gained Since Last Visit: 0    Vitals Temp: 98.2 F (36.8 C) BP: 135/81 Pulse Rate: 65 SpO2: 93 %   Anthropometric Measurements Height: 5' 1 (1.549 m) Weight: 269 lb (122 kg) BMI (Calculated): 50.85 Weight at Last Visit: 269lb Weight Lost Since Last Visit: 0 Weight Gained Since Last Visit: 0 Starting Weight: 288lb Total Weight Loss (lbs): 19 lb (8.618 kg)   Body Composition  Body Fat %: 56.8 % Fat Mass (lbs): 153 lbs Muscle Mass (lbs): 110.6 lbs Visceral Fat Rating : 22   Other Clinical Data Fasting: no Labs: no Today's Visit #: 25 Starting Date: 03/11/22    Chief complaint: Obesity  Interval History Autumn Massey is here for a follow-up office visit to discuss her progress with her obesity treatment plan. She is keeping a food journal and adhering to recommended goals of 1500 - 1700 calories and 110+ grams protein and states she is following her eating plan approximately 50 % of the time. She is walking and doing weights 15-20  minutes 3 days per week  She has experienced no weight change since last OV on 03/16/2024.   Has been stressed d/t work dynamics  Barriers identified: not moving enough and not feeling hungry, although she forces herself to eat  Her dietary and life habits include:  - Tracking Calories/Macros: yes  - Eating More Whole Foods: yes  - Adequate Protein Intake: no  - Adequate Water Intake: yes  - Sleeping 7-9 Hours/ Night: no    03/16/24 15:00 04/27/24 15:00   56.4 % 56.8 %   111.2 lbs 110.6 lbs   151.8 lbs 153 lbs   22 22   Counseling done on how  various foods will affect these numbers and how to maximize success  Total lbs lost to date:  - 19 lbs Total Fat Mass lost to date: - 7.4 lbs Total weight loss percentage to date: - 6.60 %   Recommended Dietary Goals Tonimarie is currently in the action stage of change. As such, her goal is to continue weight management plan.  She has agreed to continue journaling 1500 - 1700 calories and 110 + grams protein daily.   Nutritional and Behavioral Counseling:  We discussed the following today: continue to work on maintaining a reduced calorie state, getting the recommended amount of protein, incorporating whole foods, making healthy choices, staying well hydrated and practicing mindfulness when eating.  Additional resources provided today: None  Evidence-based interventions for health behavior change were utilized today including the discussion of self monitoring techniques, problem-solving barriers and SMART goal setting techniques.   Regarding patient's less desirable eating habits and patterns, we employed the technique of small changes.   SMART Goal(s) created today: journal daily   Recommended Physical Activity Goals Taeya has been advised to work up to 300-450 minutes of moderate intensity aerobic activity a week and strengthening exercises 2-3 times per week for cardiovascular health, weight loss maintenance and preservation of muscle mass.   She may  Continue to gradually increase the amount and intensity of exercise routine   Medical Interventions and Pharmacotherapy Previous Bariatric surgery:  n/a Pharmacotherapy: Cont Metformin  and Mounjaro  at same dose.    OBESITY RELATED CONDITIONS ADDRESSED TODAY:    Type 2 diabetes mellitus in patient with obesity [facility]) Assessment & Plan: Lab Results  Component Value Date   HGBA1C 8.2 (A) 01/12/2024   HGBA1C 7.3 (A) 07/09/2023   HGBA1C 8.1 (H) 05/25/2023   INSULIN  7.5 03/11/2022    Managed by Dr.Gherghe, endocrinologist. On  Jardiance  25 mg daily, Metformin  XR 500 mg daily, and Mounjaro  5 mg wkly with reported good compliance and tolerance. Fasting sugars average 98 to 101. Denies symptoms of hypoglycemia or hyperglycemia. No complaints of excessive hunger or cravings. Continue antidiabetic regimen and prudent nutritional plan. She follows up with Endo on 11/13 and will be getting A1c, Vit D, and other labs.    Acute reaction to situational stress Assessment & Plan: She reports experiencing significant stress related to work dynamics. She notes decreased appetite on some days but continues to make efforts to eat regularly. The importance of maintaining a consistent eating schedule was discussed. Recommended that she contact her primary care for further evaluation and management of stress. A referral to GI may be warranted if decreased appetite persists.    Hypertension associated with diabetes [facility]) Assessment & Plan: BP Readings from Last 3 Encounters:  04/27/24 135/81  03/16/24 124/74  02/02/24 129/75   HTN managed with Irbesaratan 150 mg daily and Spirolactone 25 mg twice daily. BP stable today. No acute concerns. Continue regimen and working on nutrition plan to promote weight loss and improve BP control.     Visceral obesity Assessment & Plan: Current visceral fat rating: 22.  The visceral fat rating should be < 12 in a female. Visceral adipose tissue is a hormonally active component of total body fat. This body composition phenotype is associated with medical disorders such as metabolic syndrome, cardiovascular disease, MASLD, and several malignancies including prostate, breast, and colorectal cancers. Goal: Lose 7-10% of weight via prudent nutritional plan and lifestyle changes.     Objective:   PHYSICAL EXAM: Blood pressure 135/81, pulse 65, temperature 98.2 F (36.8 C), height 5' 1 (1.549 m), weight 269 lb (122 kg), SpO2 93%. Body mass index is 50.83 kg/m.  General: she is overweight,  cooperative and in no acute distress. PSYCH: Has normal mood, affect and thought process.   HEENT: EOMI, sclerae are anicteric. Lungs: Normal breathing effort, no conversational dyspnea. Extremities: Moves * 4 Neurologic: A and O * 3, good insight  DIAGNOSTIC DATA REVIEWED: BMET    Component Value Date/Time   NA 136 12/30/2023 1716   NA 137 03/11/2022 1554   K 3.4 (L) 12/30/2023 1716   CL 100 12/30/2023 1716   CO2 26 12/30/2023 1716   GLUCOSE 103 (H) 12/30/2023 1716   BUN 10 12/30/2023 1716   BUN 11 03/11/2022 1554   CREATININE 0.57 12/30/2023 1716   CREATININE 0.73 03/02/2018 1157   CALCIUM  8.8 (L) 12/30/2023 1716   GFRNONAA >60 12/30/2023 1716   GFRNONAA 95 03/02/2018 1157   GFRAA >60 04/26/2019 1445   GFRAA 110 03/02/2018 1157   Lab Results  Component Value Date   HGBA1C 8.2 (A) 01/12/2024   HGBA1C 5.6 03/15/2012   Lab Results  Component Value Date   INSULIN  7.5 03/11/2022   Lab Results  Component Value Date   TSH 1.96 07/09/2023   CBC    Component Value Date/Time   WBC 8.3 12/30/2023 1716   RBC 5.00 12/30/2023 1716   HGB 15.5 (H) 12/30/2023 1716  HGB 19.6 (H) 03/11/2022 1554   HCT 47.8 (H) 12/30/2023 1716   HCT 57.0 (H) 03/11/2022 1554   PLT 149 (L) 12/30/2023 1716   PLT 167 03/11/2022 1554   MCV 95.6 12/30/2023 1716   MCV 92 03/11/2022 1554   MCH 31.0 12/30/2023 1716   MCHC 32.4 12/30/2023 1716   RDW 13.7 12/30/2023 1716   RDW 12.1 03/11/2022 1554   Iron Studies    Component Value Date/Time   IRON 93 01/26/2019 0959   FERRITIN 124.5 01/29/2009 0905   IRONPCTSAT 31.3 01/26/2019 0959   Lipid Panel     Component Value Date/Time   CHOL 156 01/26/2023 1503   CHOL 130 03/11/2022 1554   TRIG 281.0 (H) 01/26/2023 1503   HDL 38.50 (L) 01/26/2023 1503   HDL 34 (L) 03/11/2022 1554   CHOLHDL 4 01/26/2023 1503   VLDL 56.2 (H) 01/26/2023 1503   LDLCALC 54 03/11/2022 1554   LDLCALC 109 (H) 01/27/2020 1006   LDLDIRECT 79.0 01/26/2023 1503   Hepatic  Function Panel     Component Value Date/Time   PROT 6.7 12/30/2023 1716   PROT 6.9 03/11/2022 1554   ALBUMIN  3.3 (L) 12/30/2023 1716   ALBUMIN  4.3 03/11/2022 1554   AST 18 12/30/2023 1716   ALT 12 12/30/2023 1716   ALKPHOS 58 12/30/2023 1716   BILITOT 0.6 12/30/2023 1716   BILITOT 0.7 03/11/2022 1554   BILIDIR 0.1 01/26/2023 1503   IBILI 0.4 01/27/2020 1006      Component Value Date/Time   TSH 1.96 07/09/2023 1553   Nutritional Lab Results  Component Value Date   VD25OH 47.22 01/26/2023   VD25OH 83.22 09/11/2022   VD25OH 39.5 03/11/2022     Follow up:   Return 06/06/2024 at 3:00 PM.  She was informed of the importance of frequent follow up visits to maximize her success with intensive lifestyle modifications for her multiple health conditions.   Attestations:   I, Special Puri, acting as a stage manager for [employer], DO., have compiled all relevant documentation for today's office visit on behalf of Barnie Jenkins, DO, while in the presence of [employer], DO.  Pertinent positives were addressed with patient today. Reviewed by clinician on day of visit: allergies, medications, problem list, medical history, surgical history, family history, social history, and previous encounter notes.  I have reviewed the above documentation for accuracy and completeness, and I agree with the above. Barnie JINNY Jenkins, D.O.  The 21st Century Cures Act was signed into law in 2016 which includes the topic of electronic health records.  This provides immediate access to information in MyChart. This includes consultation notes, operative notes, office notes, lab results and pathology reports.  If you have any questions about what you read please let us  know at your next visit so we can discuss your concerns and take corrective action if need be.  We are right here with you.

## 2024-05-04 ENCOUNTER — Encounter: Payer: Self-pay | Admitting: Neurology

## 2024-05-04 ENCOUNTER — Other Ambulatory Visit: Payer: Self-pay | Admitting: Neurology

## 2024-05-04 ENCOUNTER — Encounter: Payer: Self-pay | Admitting: Internal Medicine

## 2024-05-04 MED ORDER — HORIZANT 300 MG PO TBCR: 2.0000 | EXTENDED_RELEASE_TABLET | Freq: Every day | ORAL | 5 refills | Status: AC

## 2024-05-04 NOTE — Telephone Encounter (Signed)
 Please to assist pt with scheduling Virtual Visit  - acute diarrhea.    thanks

## 2024-05-05 ENCOUNTER — Telehealth (INDEPENDENT_AMBULATORY_CARE_PROVIDER_SITE_OTHER): Admitting: Internal Medicine

## 2024-05-05 DIAGNOSIS — E538 Deficiency of other specified B group vitamins: Secondary | ICD-10-CM | POA: Diagnosis not present

## 2024-05-05 DIAGNOSIS — K591 Functional diarrhea: Secondary | ICD-10-CM | POA: Diagnosis not present

## 2024-05-05 DIAGNOSIS — F341 Dysthymic disorder: Secondary | ICD-10-CM

## 2024-05-05 DIAGNOSIS — E559 Vitamin D deficiency, unspecified: Secondary | ICD-10-CM | POA: Diagnosis not present

## 2024-05-05 MED ORDER — CLONAZEPAM 0.5 MG PO TABS: 0.5000 mg | ORAL_TABLET | Freq: Two times a day (BID) | ORAL | 2 refills | Status: AC | PRN

## 2024-05-05 NOTE — Progress Notes (Signed)
 Patient ID: Autumn Massey, female   DOB: May 13, 1966, 58 y.o.   MRN: [id number]  Virtual Visit via Video Note  I connected with Reshunda K Bracewell on 05/12/24 at  4:00 PM EST by a video enabled telemedicine application and verified that I am speaking with the correct person using two identifiers.  Location of all participants today Patient: in her car Provider: at office   I discussed the limitations of evaluation and management by telemedicine and the availability of in person appointments. The patient expressed understanding and agreed to proceed.  History of Present Illness: Here to f/u with c/o sour stomach x 3-4 days, sometimes sulfa egg like taste coming up , and 2 episodes of explosive watery loose stool the last 2 mornings on getting OOB; pt denies fever, abd pain, vomiting and no recent change in mounjaro  5 mg which she has tolerated well.  This morning stool much improved to soft stool only, Denies worsening reflux, dysphagia, n/v,  or blood. Did take immodium yesterday x 1 and seemed to help somewhat.  Also has hx of IBS and much increased stress in the last 2 wks due to social stressors and believes that may have contributed.  Pt denies chest pain, increased sob or doe, wheezing, orthopnea, PND, increased LE swelling, palpitations, dizziness or syncope.   Pt denies polydipsia, polyuria, or new focal neuro s/s. Past Medical History:  Diagnosis Date   Anxiety    ANXIETY DEPRESSION 01/29/2009   04/26/2019- no current   Arthritis    Asthma    CHF (congestive heart failure) (HCC)    COPD (chronic obstructive pulmonary disease) (HCC) 2014   DEPRESSION 11/19/2007   Diabetes (HCC)    Dyspnea    FREQUENCY, URINARY 10/17/2009   HOH (hard of hearing)    HYPERLIPIDEMIA 11/19/2007   HYPERTENSION 11/19/2007   HYPOTHYROIDISM 11/19/2007   Morbid obesity (HCC)    Neuromuscular disorder (HCC)    Neuropathy 01/26/2019   Neuropathy    Oxygen  deficiency    PNEUMONIA 08/06/2010   Pneumonia     POLYCYTHEMIA 01/29/2009   Preventative health care 04/06/2011   Sleep apnea    mild sleep apnea, on o2 at 2l at nighttime   SLEEP RELATED HYPOVENTILATION/HYPOXEMIA CCE 02/08/2009   Venous insufficiency 07/19/2012   Past Surgical History:  Procedure Laterality Date   ANTERIOR CERVICAL CORPECTOMY N/A 06/19/2021   Procedure: Cerivcal Six Corpectomy;  Surgeon: Gillie Duncans, MD;  Location: [facility] OR;  Service: Neurosurgery;  Laterality: N/A;   COLONOSCOPY N/A 12/21/2023   Procedure: COLONOSCOPY;  Surgeon: Stacia Glendia BRAVO, MD;  Location: [facility];  Service: Gastroenterology;  Laterality: N/A;   DILITATION & CURRETTAGE/HYSTROSCOPY WITH NOVASURE ABLATION N/A 04/28/2019   Procedure: DILATATION & CURETTAGE/HYSTEROSCOPY WITH NOVASURE ABLATION;  Surgeon: Johnnye Ade, MD;  Location: [facility] OR;  Service: Gynecology;  Laterality: N/A;  [employer] rep will be here confirmed on 04/20/19   TONSILLECTOMY     ULNAR TUNNEL RELEASE Left 02/22/2013   Procedure: LEFT ULNAR NERVE DECOMPRESSION ;  Surgeon: Franky JONELLE Curia, MD;  Location: [facility];  Service: Orthopedics;  Laterality: Left;    reports that she has been smoking cigarettes. She has a 11 pack-year smoking history. She has never used smokeless tobacco. She reports that she does not currently use alcohol. She reports that she does not use drugs. family history includes Alcohol abuse in an other family member; Anxiety disorder in her mother and sister; Arthritis in her maternal grandmother, mother, and another family member;  Asthma in her brother; COPD in her mother; Cancer in her maternal grandfather and mother; Colon cancer in an other family member; Colon polyps in her sister; Depression in her sister; Hearing loss in her maternal grandmother and mother; Heart disease in her mother; High Cholesterol in her sister; High blood pressure in her mother; Hypertension in her maternal grandmother, mother, and another family member; Osteoarthritis  (age of onset: 7) in her maternal grandmother; Psoriasis in her brother; Scoliosis in her maternal grandmother; Stroke in her mother; Thyroid  disease in her mother. Allergies  Allergen Reactions   Cymbalta  [Duloxetine  Hcl]     Altered mental state / agitated    Current Outpatient Medications on File Prior to Visit  Medication Sig Dispense Refill   ACCU-CHEK GUIDE TEST test strip USE AS INSTRUCTED TO CHECK BLOOD SUGAR TWICE DAILY 200 strip 2   Accu-Chek Softclix Lancets lancets USE AS INSTRUCTED TO CHECK BLOOD SUGAR ONCE DAILY 100 each 2   albuterol  (PROVENTIL ) (2.5 MG/3ML) 0.083% nebulizer solution Take 3 mLs (2.5 mg total) by nebulization every 6 (six) hours as needed for wheezing or shortness of breath. 75 mL 1   albuterol  (VENTOLIN  HFA) 108 (90 Base) MCG/ACT inhaler USE 2 INHALATIONS BY MOUTH EVERY 6 HOURS AS NEEDED 34 g 2   aspirin  EC 81 MG tablet Take 81 mg by mouth daily.     Azelastine -Fluticasone  137-50 MCG/ACT SUSP Place 1 spray into the nose every 12 (twelve) hours. 23 g 0   Blood Glucose Monitoring Suppl (ACCU-CHEK GUIDE) w/Device KIT Use as instructed to check blood sugar 2X daily 1 kit 0   BREO ELLIPTA  100-25 MCG/ACT AEPB USE 1 INHALATION BY MOUTH ONCE  DAILY AT THE SAME TIME EACH DAY 180 each 3   cholecalciferol  (VITAMIN D3) 25 MCG (1000 UNIT) tablet Take 1,000 Units by mouth daily. (Patient taking differently: Take 2,000 Units by mouth daily.)     cyclobenzaprine  (FLEXERIL ) 5 MG tablet TAKE 1 TABLET BY MOUTH 3  TIMES DAILY AS NEEDED FOR  MUSCLE SPASM(S) (Patient taking differently: Take 5 mg by mouth as needed. TAKE 1 TABLET BY MOUTH 3  TIMES DAILY AS NEEDED FOR  MUSCLE SPASM(S)) 270 tablet 1   Diclofenac  Sodium (PENNSAID ) 2 % SOLN Place 2 g onto the skin 2 (two) times daily. (Patient taking differently: Place 2 g onto the skin as needed.) 112 g 3   FLUoxetine  (PROZAC ) 40 MG capsule TAKE 1 CAPSULE BY MOUTH DAILY 100 capsule 2   furosemide  (LASIX ) 40 MG tablet TAKE 1 TABLET BY  MOUTH DAILY (Patient taking differently: Take 40 mg by mouth as needed. TAKE 1 TABLET BY MOUTH DAILY) 90 tablet 1   Gabapentin  Enacarbil ER (HORIZANT ) 300 MG TBCR Take 2 tablets (600 mg total) by mouth at bedtime. 60 tablet 5   irbesartan  (AVAPRO ) 150 MG tablet Take 1 tablet (150 mg total) by mouth daily. 90 tablet 3   JARDIANCE  25 MG TABS tablet TAKE 1 TABLET BY MOUTH DAILY  BEFORE BREAKFAST 100 tablet 2   Lancets Misc. (ACCU-CHEK FASTCLIX LANCET) KIT Use to check blood sugar once a day 1 kit 0   levothyroxine  (SYNTHROID ) 200 MCG tablet TAKE 1 TABLET BY MOUTH  DAILY 100 tablet 2   levothyroxine  (SYNTHROID ) 25 MCG tablet TAKE 1 TABLET BY MOUTH DAILY  BEFORE BREAKFAST WITH 200 MCG  TABLET FOR A TOTAL DAILY DOSE OF 225 MCG 100 tablet 2   metFORMIN  (GLUCOPHAGE -XR) 500 MG 24 hr tablet TAKE 1 TABLET BY MOUTH DAILY  WITH BREAKFAST 100 tablet 2   montelukast  (SINGULAIR ) 10 MG tablet TAKE 1 TABLET BY MOUTH AT  BEDTIME 100 tablet 2   Multiple Vitamin (MULTIVITAMIN WITH MINERALS) TABS tablet Take 1 tablet by mouth daily.     ondansetron  (ZOFRAN -ODT) 4 MG disintegrating tablet 4mg  ODT q4 hours prn nausea/vomit (Patient taking differently: as needed. 4mg  ODT q4 hours prn nausea/vomit) 20 tablet 0   oxyCODONE  (OXY IR/ROXICODONE ) 5 MG immediate release tablet Take 5 mg by mouth every 6 (six) hours as needed.     OXYGEN  2lpm with sleep only  [facility]     phenazopyridine  (PYRIDIUM ) 100 MG tablet Take 1 tablet (100 mg total) by mouth 3 (three) times daily as needed for pain. 10 tablet 1   primidone  (MYSOLINE ) 50 MG tablet TAKE 1 TABLET BY MOUTH IN THE  MORNING AND 3 TABLETS BY MOUTH  AT BEDTIME 360 tablet 3   rosuvastatin  (CRESTOR ) 40 MG tablet TAKE 1 TABLET BY MOUTH DAILY 100 tablet 2   spironolactone  (ALDACTONE ) 25 MG tablet TAKE 1 TABLET BY MOUTH TWICE  DAILY 200 tablet 2   tirzepatide  (MOUNJARO ) 5 MG/0.5ML Pen Inject 5 mg into the skin once a week. 6 mL 3   No current facility-administered medications on file  prior to visit.    Observations/Objective: Alert, NAD, appropriate mood and affect, resps normal, cn 2-12 intact, moves all 4s, no visible rash or swelling Lab Results  Component Value Date   WBC 8.3 12/30/2023   HGB 15.5 (H) 12/30/2023   HCT 47.8 (H) 12/30/2023   PLT 149 (L) 12/30/2023   GLUCOSE 103 (H) 12/30/2023   CHOL 156 01/26/2023   TRIG 281.0 (H) 01/26/2023   HDL 38.50 (L) 01/26/2023   LDLDIRECT 79.0 01/26/2023   LDLCALC 54 03/11/2022   ALT 12 12/30/2023   AST 18 12/30/2023   NA 136 12/30/2023   K 3.4 (L) 12/30/2023   CL 100 12/30/2023   CREATININE 0.57 12/30/2023   BUN 10 12/30/2023   CO2 26 12/30/2023   TSH 1.96 07/09/2023   HGBA1C 8.2 (A) 01/12/2024   MICROALBUR 7.8 01/12/2024   Assessment and Plan: See notes  Follow Up Instructions: See notes   I discussed the assessment and treatment plan with the patient. The patient was provided an opportunity to ask questions and all were answered. The patient agreed with the plan and demonstrated an understanding of the instructions.   The patient was advised to call back or seek an in-person evaluation if the symptoms worsen or if the condition fails to improve as anticipated.  Lynwood Rush, MD

## 2024-05-07 ENCOUNTER — Encounter: Payer: Self-pay | Admitting: Internal Medicine

## 2024-05-07 DIAGNOSIS — R197 Diarrhea, unspecified: Secondary | ICD-10-CM | POA: Insufficient documentation

## 2024-05-07 NOTE — Assessment & Plan Note (Signed)
 Exam benign, pt afeb non toxic, pt declines labs or stool study for now, cont immodium prn, rest, fluids, tylenol  prn if needed, and consider f/u rov for any worsening s/s

## 2024-05-07 NOTE — Assessment & Plan Note (Signed)
 Chronic persistent, for klonopin  refill prn use, declines other such as SSRI

## 2024-05-07 NOTE — Assessment & Plan Note (Signed)
 Lab Results  Component Value Date   VITAMINB12 682 01/26/2023   Stable, cont oral replacement - b12 1000 mcg qd

## 2024-05-07 NOTE — Patient Instructions (Signed)
 Ok to take the immodium prn  Please to follow up with any worsening s/s such as fever, pain, blood, weakness or falls

## 2024-05-07 NOTE — Assessment & Plan Note (Signed)
 Last vitamin D Lab Results  Component Value Date   VD25OH 47.22 01/26/2023   Stable, cont oral replacement

## 2024-05-12 ENCOUNTER — Other Ambulatory Visit

## 2024-05-12 ENCOUNTER — Ambulatory Visit (INDEPENDENT_AMBULATORY_CARE_PROVIDER_SITE_OTHER): Admitting: Internal Medicine

## 2024-05-12 ENCOUNTER — Encounter: Payer: Self-pay | Admitting: Internal Medicine

## 2024-05-12 VITALS — BP 110/62 | HR 81 | Ht 61.0 in | Wt 270.0 lb

## 2024-05-12 DIAGNOSIS — E1165 Type 2 diabetes mellitus with hyperglycemia: Secondary | ICD-10-CM

## 2024-05-12 DIAGNOSIS — E66813 Obesity, class 3: Secondary | ICD-10-CM

## 2024-05-12 DIAGNOSIS — E559 Vitamin D deficiency, unspecified: Secondary | ICD-10-CM

## 2024-05-12 DIAGNOSIS — Z7985 Long-term (current) use of injectable non-insulin antidiabetic drugs: Secondary | ICD-10-CM

## 2024-05-12 DIAGNOSIS — Z7984 Long term (current) use of oral hypoglycemic drugs: Secondary | ICD-10-CM

## 2024-05-12 DIAGNOSIS — E039 Hypothyroidism, unspecified: Secondary | ICD-10-CM | POA: Diagnosis not present

## 2024-05-12 DIAGNOSIS — E282 Polycystic ovarian syndrome: Secondary | ICD-10-CM

## 2024-05-12 LAB — POCT GLYCOSYLATED HEMOGLOBIN (HGB A1C): Hemoglobin A1C: 6.7 % — AB (ref 4.0–5.6)

## 2024-05-12 NOTE — Progress Notes (Signed)
 Patient ID: Autumn Massey, female   DOB: 07/14/1965, 58 y.o.   MRN: [id number]  HPI  Autumn Massey is a 58 y.o.-year-old female, returning for f/u for DM2, hypothyroidism, and PCOS. Last visit 4 months ago.  Interim history: She continues to go to the weight management clinic.  This she lost 14 lbs since last OV.  She needs to have a BMI of less than 45 before abdominal pannus surgery.  Also, HbA1c needs to be lower than 8%.  She was previously offered gastric bypass, but she refused. No increased urination, blurry vision, chest pain.  PCOS: Reviewed history:  - weight gain: Maximal weight 364  - was on Phentermine , which worked initially, then stopped working  - Would consider gastric bypass but only as a last resort - + hirsutism >> started to see facial hair in her 30s - + hair loss >> female pattern, and bald spot in vertex - No acne - had a Mirena IUD placed 04/2013 for heavy menstrual cycles >> out now; no menses since IUD out - has frequent Prednisone  tapers    She was previously on spironolactone  50 mg twice daily but her potassium increased so we had to decrease the dose to 25 mg twice daily.  We ended up stopping spironolactone  11/2016.  However, she started to have more facial hair and more frontal balding so we restarted spironolactone  25 mg twice a day.  Her potassium level is normal now: Lab Results  Component Value Date   K 3.4 (L) 12/30/2023   Hypothyroidism: Pt. has been dx with hypothyroidism in 2005.  Pt is on levothyroxine  (crushed) 225 mcg daily, taken: - in am - fasting - at least 3h from b'fast - no Ca, Fe - + MVI 2h later! >> moved at night - no PPIs - not on Biotin  Reviewed her TFTs: Lab Results  Component Value Date   TSH 1.96 07/09/2023   TSH 6.17 (H) 02/26/2023   TSH 3.270 03/11/2022   TSH 2.55 07/10/2021   TSH 4.06 03/15/2021   TSH 21.97 (H) 12/06/2020   TSH 13.74 (H) 10/03/2020   TSH 42.400 (H) 09/19/2020   TSH 71.60 (H) 01/27/2020    TSH 0.80 10/20/2019   FREET4 1.4 07/09/2023   FREET4 0.99 02/26/2023   FREET4 1.40 03/11/2022   FREET4 1.02 03/15/2021   FREET4 0.70 12/06/2020   FREET4 0.90 10/03/2020   FREET4 0.68 (L) 09/19/2020   FREET4 1.17 10/20/2019   FREET4 1.23 03/01/2019   FREET4 1.21 07/26/2018    Pt denies: - feeling nodules in neck - hoarseness - dysphagia - choking  She was on Neurontin  for tremors, but had to decrease the dose because of somnolence and then she was able to stop. She is on primidone  >> restarted by Dr. Skeet.  She was on Lyrica  at night >> switched to Gabapentin .  DM2: Reviewed HbA1c levels: Lab Results  Component Value Date   HGBA1C 8.2 (A) 01/12/2024   HGBA1C 7.3 (A) 07/09/2023   HGBA1C 8.1 (H) 05/25/2023  04/26/2019: HbA1c calculated from fructosamine is 5.4% 03/09/2018: HbA1c calculated from fructosamine is better, at 5.5%! 09/02/2017: HbA1c calculated from Fructosamine is 6.1%. 05/05/2017: HbA1c calculated from Fructosamine is 6.00%. 03/10/2017: HbA1c calculated from Fructosamine is 6.17% (great!). 11/2016: HbA1c calculated from the fructosamine is radically better, at 5.77%!  She is on: - Metformin  1000 mg with dinner >> 500 mg with dinner >> in am - Jardiance  10 mg in the pm -  started 02/2017 >>  initially stopped b/c UTI and also had a tear on her labia >> resolved >> now 25 mg daily - Ozempic  0.25 >> 0.5 mg weekly >> weight plateaued, increased hunger >> stopped >> Mounjaro  2.5 >> 5 mg weekly Not taking Januvia  >> not covered. She was previously on glipizide .  She was checking sugars 1-2 times a day: - am: 120-125 >> 100-110 >> 153, 169 >> 108-128>> 98-120, 130 - before b'fast: 100-110 >> n/c - after b'fast: 153 >> n/c  - lunch: n/c >> 98-191 >> n/c - after lunch: 123 >> 141, 170s >> 119-140s >> 130-151 >> 104-120 - dinner: 120-140, 157 >> n/c >> 94-114 >> n/c - after dinner: 142-168 >> n/c >> 120-140s >> 140-180 >> <140 Highest CBG: 230 (b'day cake + iceceam) >>  .SABRASABRA 180  No CKD: Lab Results  Component Value Date   BUN 10 12/30/2023   Lab Results  Component Value Date   CREATININE 0.57 12/30/2023   Lab Results  Component Value Date   MICRALBCREAT 236 (H) 01/12/2024   MICRALBCREAT 31 (H) 01/27/2020  She was taken off Irbesartan  >> taken off by PCP b/c low BP, but now restarted.  + HL: Lab Results  Component Value Date   CHOL 156 01/26/2023   HDL 38.50 (L) 01/26/2023   LDLCALC 54 03/11/2022   LDLDIRECT 79.0 01/26/2023   TRIG 281.0 (H) 01/26/2023   CHOLHDL 4 01/26/2023  She is on Crestor  40 mg daily.  She had an eye exam - 05/2023: No DR reportedly.  She had a foot exam 07/29/2023.  She was on extended release gabapentin  in the past (Horizant ) but this is now not covered by her insurance so she is on instant release Neurontin .  She quit smoking 05/2015.  She had a D&C on 04/28/2019.  She has a h/o vitamin D  def.: Lab Results  Component Value Date   VD25OH 47.22 01/26/2023   VD25OH 83.22 09/11/2022   VD25OH 39.5 03/11/2022   VD25OH 35.27 07/10/2021   VD25OH 29.64 (L) 10/03/2020   VD25OH 27 (L) 01/27/2020   VD25OH 28.16 (L) 01/26/2019   On vitamin D  5000 units now - last 2 days, prev. 2000 units daily.  ROS: + See HPI + Tremors   I reviewed pt's medications, allergies, PMH, social hx, family hx, and changes were documented in the history of present illness. Otherwise, unchanged from my initial visit note.  She does not have a family history of medullary thyroid  cancer or personal history of pancreatitis.  Past Medical History:  Diagnosis Date   Anxiety    ANXIETY DEPRESSION 01/29/2009   04/26/2019- no current   Arthritis    Asthma    CHF (congestive heart failure) (HCC)    COPD (chronic obstructive pulmonary disease) (HCC) 2014   DEPRESSION 11/19/2007   Diabetes (HCC)    Dyspnea    FREQUENCY, URINARY 10/17/2009   HOH (hard of hearing)    HYPERLIPIDEMIA 11/19/2007   HYPERTENSION 11/19/2007   HYPOTHYROIDISM  11/19/2007   Morbid obesity (HCC)    Neuromuscular disorder (HCC)    Neuropathy 01/26/2019   Neuropathy    Oxygen  deficiency    PNEUMONIA 08/06/2010   Pneumonia    POLYCYTHEMIA 01/29/2009   Preventative health care 04/06/2011   Sleep apnea    mild sleep apnea, on o2 at 2l at nighttime   SLEEP RELATED HYPOVENTILATION/HYPOXEMIA CCE 02/08/2009   Venous insufficiency 07/19/2012   Past Surgical History:  Procedure Laterality Date   ANTERIOR CERVICAL CORPECTOMY N/A 06/19/2021  Procedure: Cerivcal Six Corpectomy;  Surgeon: Gillie Duncans, MD;  Location: [facility];  Service: Neurosurgery;  Laterality: N/A;   COLONOSCOPY N/A 12/21/2023   Procedure: COLONOSCOPY;  Surgeon: Stacia Glendia BRAVO, MD;  Location: [facility];  Service: Gastroenterology;  Laterality: N/A;   DILITATION & CURRETTAGE/HYSTROSCOPY WITH NOVASURE ABLATION N/A 04/28/2019   Procedure: DILATATION & CURETTAGE/HYSTEROSCOPY WITH NOVASURE ABLATION;  Surgeon: Johnnye Ade, MD;  Location: [facility] OR;  Service: Gynecology;  Laterality: N/A;  [employer] rep will be here confirmed on 04/20/19   TONSILLECTOMY     ULNAR TUNNEL RELEASE Left 02/22/2013   Procedure: LEFT ULNAR NERVE DECOMPRESSION ;  Surgeon: Franky JONELLE Curia, MD;  Location: [facility];  Service: Orthopedics;  Laterality: Left;   Social History   Socioeconomic History   Marital status: Divorced    Spouse name: Not on file   Number of children: 0   Years of education: 12   Highest education level: GED or equivalent  Occupational History   Occupation: Disabled    Employer: [employer]  Tobacco Use   Smoking status: Every Day    Current packs/day: 0.50    Average packs/day: 0.5 packs/day for 22.0 years (11.0 ttl pk-yrs)    Types: Cigarettes   Smokeless tobacco: Never  Vaping Use   Vaping status: Never Used  Substance and Sexual Activity   Alcohol use: Not Currently    Comment: rare   Drug use: No   Sexual activity: Not Currently  Other Topics  Concern   Not on file  Social History Narrative   Patient is right-handed.   Lives alone   Social Drivers of Health   Financial Resource Strain: Low Risk  (01/26/2024)   Overall Financial Resource Strain (CARDIA)    Difficulty of Paying Living Expenses: Not very hard  Food Insecurity: No Food Insecurity (01/26/2024)   Hunger Vital Sign    Worried About Running Out of Food in the Last Year: Never true    Ran Out of Food in the Last Year: Never true  Transportation Needs: No Transportation Needs (01/26/2024)   PRAPARE - Administrator, Civil Service (Medical): No    Lack of Transportation (Non-Medical): No  Physical Activity: Insufficiently Active (01/26/2024)   Exercise Vital Sign    Days of Exercise per Week: 1 day    Minutes of Exercise per Session: 10 min  Stress: No Stress Concern Present (01/26/2024)   [employer] of Occupational Health - Occupational Stress Questionnaire    Feeling of Stress: Only a little  Social Connections: Moderately Isolated (01/26/2024)   Social Connection and Isolation Panel    Frequency of Communication with Friends and Family: More than three times a week    Frequency of Social Gatherings with Friends and Family: Once a week    Attends Religious Services: 1 to 4 times per year    Active Member of [employer] or Organizations: No    Attends Engineer, Structural: Not on file    Marital Status: Divorced  Intimate Partner Violence: Patient Unable To Answer (06/29/2023)   Humiliation, Afraid, Rape, and Kick questionnaire    Fear of Current or Ex-Partner: Patient unable to answer    Emotionally Abused: Patient unable to answer    Physically Abused: Patient unable to answer    Sexually Abused: Patient unable to answer   Current Outpatient Medications on File Prior to Visit  Medication Sig Dispense Refill   ACCU-CHEK GUIDE TEST test strip USE AS INSTRUCTED TO CHECK  BLOOD SUGAR TWICE DAILY 200 strip 2   Accu-Chek Softclix Lancets lancets  USE AS INSTRUCTED TO CHECK BLOOD SUGAR ONCE DAILY 100 each 2   albuterol  (PROVENTIL ) (2.5 MG/3ML) 0.083% nebulizer solution Take 3 mLs (2.5 mg total) by nebulization every 6 (six) hours as needed for wheezing or shortness of breath. 75 mL 1   albuterol  (VENTOLIN  HFA) 108 (90 Base) MCG/ACT inhaler USE 2 INHALATIONS BY MOUTH EVERY 6 HOURS AS NEEDED 34 g 2   aspirin  EC 81 MG tablet Take 81 mg by mouth daily.     Azelastine -Fluticasone  137-50 MCG/ACT SUSP Place 1 spray into the nose every 12 (twelve) hours. 23 g 0   Blood Glucose Monitoring Suppl (ACCU-CHEK GUIDE) w/Device KIT Use as instructed to check blood sugar 2X daily 1 kit 0   BREO ELLIPTA  100-25 MCG/ACT AEPB USE 1 INHALATION BY MOUTH ONCE  DAILY AT THE SAME TIME EACH DAY 180 each 3   cholecalciferol  (VITAMIN D3) 25 MCG (1000 UNIT) tablet Take 1,000 Units by mouth daily. (Patient taking differently: Take 2,000 Units by mouth daily.)     clonazePAM  (KLONOPIN ) 0.5 MG tablet Take 1 tablet (0.5 mg total) by mouth 2 (two) times daily as needed for anxiety. 60 tablet 2   cyclobenzaprine  (FLEXERIL ) 5 MG tablet TAKE 1 TABLET BY MOUTH 3  TIMES DAILY AS NEEDED FOR  MUSCLE SPASM(S) (Patient taking differently: Take 5 mg by mouth as needed. TAKE 1 TABLET BY MOUTH 3  TIMES DAILY AS NEEDED FOR  MUSCLE SPASM(S)) 270 tablet 1   Diclofenac  Sodium (PENNSAID ) 2 % SOLN Place 2 g onto the skin 2 (two) times daily. (Patient taking differently: Place 2 g onto the skin as needed.) 112 g 3   FLUoxetine  (PROZAC ) 40 MG capsule TAKE 1 CAPSULE BY MOUTH DAILY 100 capsule 2   furosemide  (LASIX ) 40 MG tablet TAKE 1 TABLET BY MOUTH DAILY (Patient taking differently: Take 40 mg by mouth as needed. TAKE 1 TABLET BY MOUTH DAILY) 90 tablet 1   Gabapentin  Enacarbil ER (HORIZANT ) 300 MG TBCR Take 2 tablets (600 mg total) by mouth at bedtime. 60 tablet 5   irbesartan  (AVAPRO ) 150 MG tablet Take 1 tablet (150 mg total) by mouth daily. 90 tablet 3   JARDIANCE  25 MG TABS tablet TAKE 1  TABLET BY MOUTH DAILY  BEFORE BREAKFAST 100 tablet 2   Lancets Misc. (ACCU-CHEK FASTCLIX LANCET) KIT Use to check blood sugar once a day 1 kit 0   levothyroxine  (SYNTHROID ) 200 MCG tablet TAKE 1 TABLET BY MOUTH  DAILY 100 tablet 2   levothyroxine  (SYNTHROID ) 25 MCG tablet TAKE 1 TABLET BY MOUTH DAILY  BEFORE BREAKFAST WITH 200 MCG  TABLET FOR A TOTAL DAILY DOSE OF 225 MCG 100 tablet 2   metFORMIN  (GLUCOPHAGE -XR) 500 MG 24 hr tablet TAKE 1 TABLET BY MOUTH DAILY  WITH BREAKFAST 100 tablet 2   montelukast  (SINGULAIR ) 10 MG tablet TAKE 1 TABLET BY MOUTH AT  BEDTIME 100 tablet 2   Multiple Vitamin (MULTIVITAMIN WITH MINERALS) TABS tablet Take 1 tablet by mouth daily.     ondansetron  (ZOFRAN -ODT) 4 MG disintegrating tablet 4mg  ODT q4 hours prn nausea/vomit (Patient taking differently: as needed. 4mg  ODT q4 hours prn nausea/vomit) 20 tablet 0   oxyCODONE  (OXY IR/ROXICODONE ) 5 MG immediate release tablet Take 5 mg by mouth every 6 (six) hours as needed.     OXYGEN  2lpm with sleep only  [facility]     phenazopyridine  (PYRIDIUM ) 100 MG tablet Take 1  tablet (100 mg total) by mouth 3 (three) times daily as needed for pain. 10 tablet 1   primidone  (MYSOLINE ) 50 MG tablet TAKE 1 TABLET BY MOUTH IN THE  MORNING AND 3 TABLETS BY MOUTH  AT BEDTIME 360 tablet 3   rosuvastatin  (CRESTOR ) 40 MG tablet TAKE 1 TABLET BY MOUTH DAILY 100 tablet 2   spironolactone  (ALDACTONE ) 25 MG tablet TAKE 1 TABLET BY MOUTH TWICE  DAILY 200 tablet 2   tirzepatide  (MOUNJARO ) 5 MG/0.5ML Pen Inject 5 mg into the skin once a week. 6 mL 3   No current facility-administered medications on file prior to visit.   Allergies  Allergen Reactions   Cymbalta  [Duloxetine  Hcl]     Altered mental state / agitated    Family History  Problem Relation Age of Onset   Cancer Mother        melanoma    Heart disease Mother    High blood pressure Mother    Stroke Mother    Thyroid  disease Mother    Anxiety disorder Mother    Arthritis Mother    COPD  Mother    Hearing loss Mother    Hypertension Mother    Colon polyps Sister    High Cholesterol Sister    Anxiety disorder Sister    Depression Sister    Psoriasis Brother    Osteoarthritis Maternal Grandmother 59   Scoliosis Maternal Grandmother    Arthritis Maternal Grandmother    Hearing loss Maternal Grandmother    Hypertension Maternal Grandmother    Cancer Maternal Grandfather        prostate cancer   Colon cancer Other    Alcohol abuse Other    Arthritis Other    Hypertension Other    Asthma Brother    Esophageal cancer Neg Hx    Rectal cancer Neg Hx    Stomach cancer Neg Hx    PE: BP 110/62   Pulse 81   Ht 5' 1 (1.549 m)   Wt 270 lb (122.5 kg)   SpO2 96%   BMI 51.02 kg/m  Wt Readings from Last 10 Encounters:  05/12/24 270 lb (122.5 kg)  04/27/24 269 lb (122 kg)  03/16/24 269 lb (122 kg)  02/02/24 280 lb (127 kg)  01/26/24 285 lb 12.8 oz (129.6 kg)  01/12/24 284 lb 9.6 oz (129.1 kg)  01/06/24 288 lb (130.6 kg)  12/30/23 290 lb (131.5 kg)  12/18/23 278 lb (126.1 kg)  12/15/23 278 lb (126.1 kg)   Constitutional: overweight, in NAD Eyes: no exophthalmos ENT: no thyromegaly, no cervical lymphadenopathy Cardiovascular: RRR, No MRG Respiratory: CTA B Musculoskeletal: no deformities Skin:  + stasis dermatitis rashes on lower legs R>L Neurological: no tremor with outstretched hands Diabetic Foot Exam - Simple   Simple Foot Form Diabetic Foot exam was performed with the following findings: Yes 05/12/2024  3:42 PM  Visual Inspection No deformities, no ulcerations, no other skin breakdown bilaterally: Yes Sensation Testing Intact to touch and monofilament testing bilaterally: Yes Pulse Check Posterior Tibialis and Dorsalis pulse intact bilaterally: Yes Comments + Dry skin B    PLAN:  1. Patient with longstanding hypothyroidism -Previously uncontrolled -Last year, she had very high TSH levels, 71 and 42 respectively, despite a high dose of  levothyroxine .  I was not aware of these results, but I became aware of her TSH from 09/2020 which improved to 13.7.  Afterwards, we discussed about the need for compliance and also advised her to crush the levothyroxine   tablet, after which her TFTs normalized. - latest thyroid  labs reviewed with pt. >> normal: Lab Results  Component Value Date   TSH 1.96 07/09/2023  - she continues on LT4 225 mcg daily (crushed) - pt feels good on this dose. - we discussed about taking the thyroid  hormone every day, with water, >30 minutes before breakfast, separated by >4 hours from acid reflux medications, calcium , iron, multivitamins. Pt. is taking it correctly. - will check thyroid  tests today: TSH and fT4 - If labs are abnormal, she will need to return for repeat TFTs in 1.5 months'  2. PCOS -She aged out her PCOS diagnosis -However, she continues to have increased facial hair and alopecia and continue spironolactone  25 mg twice a day, as she feels that this is helping -Latest potassium level was not elevated: Lab Results  Component Value Date   K 3.4 (L) 12/30/2023  -We cannot increase spironolactone  dose further due to previous hyperkalemia at the higher doses  3. DM2 - Patient with suboptimally controlled type 2 diabetes, on oral antidiabetic regimen with metformin  and SGLT2 inhibitor, but with plateauing benefits of weekly GLP-1 receptor agonist (increased appetite, weight plateaued, i then nausea, vomiting, diarrhea possibly in the setting of diverticulitis), but tolerating Mounjaro  well.  This was started at last visit.  At that time, HbA1c was higher, at 8.2%, but this was higher than expected from her blood sugars at home.  Reportedly, sugars were at goal during the day at that time. - At today's visit, sugars are at goal.  They improved on Mounjaro .  She tolerates it well.  Will continue the same dose for now. - I suggested: Patient Instructions  Please continue: - Metformin  ER 500 mg with  breakfast - Jardiance  25 mg before breakfast - Mounjaro  5 mg weekly   Please continue the crushed levothyroxine  225 g daily.   Take the thyroid  hormone every day, with water, at least 30 minutes before breakfast, separated by at least 4 hours from: - acid reflux medications - calcium  - iron - multivitamins   Continue spironolactone  25 mg twice a day.   Please return in 4 months.  - we checked her HbA1c: 6.7% (improved) - advised to check sugars at different times of the day - 1x a day, rotating check times - advised for yearly eye exams >> she is UTD - she had elevated ACR. She was taken off irbesartan  by PCP due to low blood pressure but she was planning to discuss with him to have this restarted.  This was restarted 12/2023. -At today's visit we will recheck her ACR.  Will also check her cholesterol level and, per her request, vitamin D  level - return to clinic in 4 months  4.  Obesity class III - Will continue Jardiance  and Mounjaro  which should both help with weight loss - She was previously on Ozempic  but came off due to the decreased appetite - In the past, she was doing a partially keto diet with intermittent fasting 16-8 - She gained a significant amount of weight during her COVID infection at the beginning of 2022 - Afterwards, she started hand weights and chair exercises - She then started to see the weight management clinic and lost 12 pounds - She is planning to have abdominal pannus surgery but BMI needs to be lower than 45 - She is now seen in the weight management clinic - she lost a net 2 pounds before last visit - At last visit we tried again to start  Mounjaro  and increase the dose as tolerated.  Since last visit she lost 14 pounds and sugars improved.  She did lose some muscle mass as she is not as hungry as before and did not follow with a diet recommended by her weight management specialist.  For now, I did not suggest an increase in the Mounjaro  dose.  Orders  Placed This Encounter  Procedures   TSH   T4, free   VITAMIN D  25 Hydroxy (Vit-D Deficiency, Fractures)   Microalbumin / creatinine urine ratio   Lipid Panel w/reflex Direct LDL   POCT glycosylated hemoglobin (Hb A1C)   Lela Fendt, MD PhD [facility]

## 2024-05-12 NOTE — Patient Instructions (Addendum)
 Please continue: - Metformin  ER 500 mg with breakfast - Jardiance  25 mg before breakfast - Mounjaro  5 mg weekly   Please continue the crushed levothyroxine  225 g daily.   Take the thyroid  hormone every day, with water, at least 30 minutes before breakfast, separated by at least 4 hours from: - acid reflux medications - calcium  - iron - multivitamins   Continue spironolactone  25 mg twice a day.   Please return in 4 months.

## 2024-05-13 ENCOUNTER — Ambulatory Visit: Payer: Self-pay | Admitting: Internal Medicine

## 2024-05-13 LAB — LIPID PANEL W/REFLEX DIRECT LDL
Cholesterol: 125 mg/dL (ref <200)
HDL: 34 mg/dL — ABNORMAL LOW (ref >=50)
LDL Cholesterol (Calc): 60 mg/dL
Non-HDL Cholesterol (Calc): 91 mg/dL (ref <130)
Total CHOL/HDL Ratio: 3.7 (calc) (ref <5.0)
Triglycerides: 296 mg/dL — ABNORMAL HIGH (ref <150)

## 2024-05-13 LAB — MICROALBUMIN / CREATININE URINE RATIO
Creatinine, Urine: 65 mg/dL (ref 20–275)
Microalb Creat Ratio: 268 mg/g{creat} — ABNORMAL HIGH (ref <30)
Microalb, Ur: 17.4 mg/dL

## 2024-05-13 LAB — TSH: TSH: 0.52 m[IU]/L (ref 0.40–4.50)

## 2024-05-13 LAB — T4, FREE: Free T4: 1.5 ng/dL (ref 0.8–1.8)

## 2024-05-13 LAB — VITAMIN D 25 HYDROXY (VIT D DEFICIENCY, FRACTURES): Vit D, 25-Hydroxy: 57 ng/mL (ref 30–100)

## 2024-06-06 ENCOUNTER — Ambulatory Visit (INDEPENDENT_AMBULATORY_CARE_PROVIDER_SITE_OTHER): Admitting: Family Medicine

## 2024-06-08 ENCOUNTER — Ambulatory Visit

## 2024-06-08 VITALS — Ht 61.0 in | Wt 264.0 lb

## 2024-06-08 DIAGNOSIS — Z0000 Encounter for general adult medical examination without abnormal findings: Secondary | ICD-10-CM | POA: Diagnosis not present

## 2024-06-08 NOTE — Progress Notes (Addendum)
 Chief Complaint  Patient presents with   Medicare Wellness     Subjective:   Autumn Massey is a 58 y.o. female who presents for a Medicare Annual Wellness Visit.  Visit info / Clinical Intake: Medicare Wellness Visit Type:: Subsequent Annual Wellness Visit Persons participating in visit and providing information:: patient Medicare Wellness Visit Mode:: Telephone If telephone:: video declined Since this visit was completed virtually, some vitals may be partially provided or unavailable. Missing vitals are due to the limitations of the virtual format.: Documented vitals are patient reported If Telephone or Video please confirm:: I connected with patient using audio/video enable telemedicine. I verified patient identity with two identifiers, discussed telehealth limitations, and patient agreed to proceed. Patient Location:: Home Provider Location:: Office Interpreter Needed?: No Pre-visit prep was completed: yes AWV questionnaire completed by patient prior to visit?: no Living arrangements:: (!) lives alone Patient's Overall Health Status Rating: good Typical amount of pain: none Does pain affect daily life?: no Are you currently prescribed opioids?: (!) yes  Dietary Habits and Nutritional Risks How many meals a day?: 2 (2-3) Eats fruit and vegetables daily?: yes Most meals are obtained by: preparing own meals; eating out In the last 2 weeks, have you had any of the following?: none Diabetic:: (!) yes Any non-healing wounds?: no How often do you check your BS?: 1; as needed Would you like to be referred to a Nutritionist or for Diabetic Management? : no  Functional Status Activities of Daily Living (to include ambulation/medication): Independent Ambulation: Independent with device- listed below Home Assistive Devices/Equipment: Eyeglasses; Oxygen ; Dentures (specify type); Nebulizers Medication Administration: Independent Home Management (perform basic housework or  laundry): Independent Manage your own finances?: yes Primary transportation is: driving Concerns about vision?: no *vision screening is required for WTM* Concerns about hearing?: (!) yes Uses hearing aids?: (!) yes Hear whispered voice?: yes  Fall Screening Falls in the past year?: 0 Number of falls in past year: 0 Was there an injury with Fall?: 0 Fall Risk Category Calculator: 0 Patient Fall Risk Level: Low Fall Risk  Fall Risk Patient at Risk for Falls Due to: No Fall Risks Fall risk Follow up: Falls evaluation completed; Falls prevention discussed  Home and Transportation Safety: All rugs have non-skid backing?: N/A, no rugs All stairs or steps have railings?: yes (outside) Grab bars in the bathtub or shower?: yes Have non-skid surface in bathtub or shower?: yes Good home lighting?: yes Regular seat belt use?: yes  Cognitive Assessment Difficulty concentrating, remembering, or making decisions? : no Will 6CIT or Mini Cog be Completed: yes 6CIT or Mini Cog Declined: patient alert, oriented, able to answer questions appropriately and recall recent events (pt completed 6CIT test) What year is it?: 0 points What month is it?: 0 points Give patient an address phrase to remember (5 components): [address] [place], [place] About what time is it?: 0 points Count backwards from 20 to 1: 0 points Say the months of the year in reverse: 0 points Repeat the address phrase from earlier: 0 points 6 CIT Score: 0 points  Advance Directives (For Healthcare) Does Patient Have a Medical Advance Directive?: No Would patient like information on creating a medical advance directive?: No - Patient declined  Reviewed/Updated  Reviewed/Updated: Reviewed All (Medical, Surgical, Family, Medications, Allergies, Care Teams, Patient Goals)    Allergies (verified) Cymbalta  [duloxetine  hcl]   Current Medications (verified) Outpatient Encounter Medications as of 06/08/2024  Medication Sig    ACCU-CHEK GUIDE TEST test  strip USE AS INSTRUCTED TO CHECK BLOOD SUGAR TWICE DAILY   Accu-Chek Softclix Lancets lancets USE AS INSTRUCTED TO CHECK BLOOD SUGAR ONCE DAILY   albuterol  (PROVENTIL ) (2.5 MG/3ML) 0.083% nebulizer solution Take 3 mLs (2.5 mg total) by nebulization every 6 (six) hours as needed for wheezing or shortness of breath.   albuterol  (VENTOLIN  HFA) 108 (90 Base) MCG/ACT inhaler USE 2 INHALATIONS BY MOUTH EVERY 6 HOURS AS NEEDED   aspirin  EC 81 MG tablet Take 81 mg by mouth daily.   Azelastine -Fluticasone  137-50 MCG/ACT SUSP Place 1 spray into the nose every 12 (twelve) hours.   Blood Glucose Monitoring Suppl (ACCU-CHEK GUIDE) w/Device KIT Use as instructed to check blood sugar 2X daily   BREO ELLIPTA  100-25 MCG/ACT AEPB USE 1 INHALATION BY MOUTH ONCE  DAILY AT THE SAME TIME EACH DAY   cholecalciferol  (VITAMIN D3) 25 MCG (1000 UNIT) tablet Take 1,000 Units by mouth daily. (Patient taking differently: Take 2,000 Units by mouth daily.)   clonazePAM  (KLONOPIN ) 0.5 MG tablet Take 1 tablet (0.5 mg total) by mouth 2 (two) times daily as needed for anxiety.   cyclobenzaprine  (FLEXERIL ) 5 MG tablet TAKE 1 TABLET BY MOUTH 3  TIMES DAILY AS NEEDED FOR  MUSCLE SPASM(S) (Patient taking differently: Take 5 mg by mouth as needed. TAKE 1 TABLET BY MOUTH 3  TIMES DAILY AS NEEDED FOR  MUSCLE SPASM(S))   Diclofenac  Sodium (PENNSAID ) 2 % SOLN Place 2 g onto the skin 2 (two) times daily. (Patient taking differently: Place 2 g onto the skin as needed.)   FLUoxetine  (PROZAC ) 40 MG capsule TAKE 1 CAPSULE BY MOUTH DAILY   furosemide  (LASIX ) 40 MG tablet TAKE 1 TABLET BY MOUTH DAILY (Patient taking differently: Take 40 mg by mouth as needed. TAKE 1 TABLET BY MOUTH DAILY)   Gabapentin  Enacarbil ER (HORIZANT ) 300 MG TBCR Take 2 tablets (600 mg total) by mouth at bedtime.   irbesartan  (AVAPRO ) 150 MG tablet Take 1 tablet (150 mg total) by mouth daily.   JARDIANCE  25 MG TABS tablet TAKE 1 TABLET BY MOUTH DAILY   BEFORE BREAKFAST   Lancets Misc. (ACCU-CHEK FASTCLIX LANCET) KIT Use to check blood sugar once a day   levothyroxine  (SYNTHROID ) 200 MCG tablet TAKE 1 TABLET BY MOUTH  DAILY   levothyroxine  (SYNTHROID ) 25 MCG tablet TAKE 1 TABLET BY MOUTH DAILY  BEFORE BREAKFAST WITH 200 MCG  TABLET FOR A TOTAL DAILY DOSE OF 225 MCG   metFORMIN  (GLUCOPHAGE -XR) 500 MG 24 hr tablet TAKE 1 TABLET BY MOUTH DAILY  WITH BREAKFAST   montelukast  (SINGULAIR ) 10 MG tablet TAKE 1 TABLET BY MOUTH AT  BEDTIME   Multiple Vitamin (MULTIVITAMIN WITH MINERALS) TABS tablet Take 1 tablet by mouth daily.   ondansetron  (ZOFRAN -ODT) 4 MG disintegrating tablet 4mg  ODT q4 hours prn nausea/vomit (Patient taking differently: as needed. 4mg  ODT q4 hours prn nausea/vomit)   oxyCODONE  (OXY IR/ROXICODONE ) 5 MG immediate release tablet Take 5 mg by mouth every 6 (six) hours as needed.   OXYGEN  2lpm with sleep only  [facility]   phenazopyridine  (PYRIDIUM ) 100 MG tablet Take 1 tablet (100 mg total) by mouth 3 (three) times daily as needed for pain.   primidone  (MYSOLINE ) 50 MG tablet TAKE 1 TABLET BY MOUTH IN THE  MORNING AND 3 TABLETS BY MOUTH  AT BEDTIME   rosuvastatin  (CRESTOR ) 40 MG tablet TAKE 1 TABLET BY MOUTH DAILY   spironolactone  (ALDACTONE ) 25 MG tablet TAKE 1 TABLET BY MOUTH TWICE  DAILY  tirzepatide  (MOUNJARO ) 5 MG/0.5ML Pen Inject 5 mg into the skin once a week.   No facility-administered encounter medications on file as of 06/08/2024.    History: Past Medical History:  Diagnosis Date   Anxiety    ANXIETY DEPRESSION 01/29/2009   04/26/2019- no current   Arthritis    Asthma    CHF (congestive heart failure) (HCC)    COPD (chronic obstructive pulmonary disease) (HCC) 2014   DEPRESSION 11/19/2007   Diabetes (HCC)    Dyspnea    FREQUENCY, URINARY 10/17/2009   HOH (hard of hearing)    HYPERLIPIDEMIA 11/19/2007   HYPERTENSION 11/19/2007   HYPOTHYROIDISM 11/19/2007   Morbid obesity (HCC)    Neuromuscular disorder (HCC)     Neuropathy 01/26/2019   Neuropathy    Oxygen  deficiency    PNEUMONIA 08/06/2010   Pneumonia    POLYCYTHEMIA 01/29/2009   Preventative health care 04/06/2011   Sleep apnea    mild sleep apnea, on o2 at 2l at nighttime   SLEEP RELATED HYPOVENTILATION/HYPOXEMIA CCE 02/08/2009   Venous insufficiency 07/19/2012   Past Surgical History:  Procedure Laterality Date   ANTERIOR CERVICAL CORPECTOMY N/A 06/19/2021   Procedure: Cerivcal Six Corpectomy;  Surgeon: Gillie Duncans, MD;  Location: [facility] OR;  Service: Neurosurgery;  Laterality: N/A;   COLONOSCOPY N/A 12/21/2023   Procedure: COLONOSCOPY;  Surgeon: Stacia Glendia BRAVO, MD;  Location: [facility];  Service: Gastroenterology;  Laterality: N/A;   DILITATION & CURRETTAGE/HYSTROSCOPY WITH NOVASURE ABLATION N/A 04/28/2019   Procedure: DILATATION & CURETTAGE/HYSTEROSCOPY WITH NOVASURE ABLATION;  Surgeon: Johnnye Ade, MD;  Location[facility] OR;  Service: Gynecology;  Laterality: N/A;  [employer] rep will be here confirmed on 04/20/19   TONSILLECTOMY     ULNAR TUNNEL RELEASE Left 02/22/2013   Procedure: LEFT ULNAR NERVE DECOMPRESSION ;  Surgeon: Franky JONELLE Curia, MD;  Location: [facility];  Service: Orthopedics;  Laterality: Left;   Family History  Problem Relation Age of Onset   Cancer Mother        melanoma    Heart disease Mother    High blood pressure Mother    Stroke Mother    Thyroid  disease Mother    Anxiety disorder Mother    Arthritis Mother    COPD Mother    Hearing loss Mother    Hypertension Mother    Colon polyps Sister    High Cholesterol Sister    Anxiety disorder Sister    Depression Sister    Psoriasis Brother    Osteoarthritis Maternal Grandmother 31   Scoliosis Maternal Grandmother    Arthritis Maternal Grandmother    Hearing loss Maternal Grandmother    Hypertension Maternal Grandmother    Cancer Maternal Grandfather        prostate cancer   Colon cancer Other    Alcohol abuse Other    Arthritis Other     Hypertension Other    Asthma Brother    Esophageal cancer Neg Hx    Rectal cancer Neg Hx    Stomach cancer Neg Hx    Social History   Occupational History   Occupation: Disabled    Employer: [employer]  Tobacco Use   Smoking status: Every Day    Current packs/day: 0.50    Average packs/day: 0.5 packs/day for 22.0 years (11.0 ttl pk-yrs)    Types: Cigarettes   Smokeless tobacco: Never  Vaping Use   Vaping status: Never Used  Substance and Sexual Activity   Alcohol use: Not Currently  Alcohol/week: 1.0 standard drink of alcohol    Types: 1 Glasses of wine per week    Comment: rare   Drug use: No   Sexual activity: Not Currently   Tobacco Counseling Ready to quit: No Counseling given: Yes  SDOH Screenings   Food Insecurity: No Food Insecurity (06/08/2024)  Housing: Unknown (06/08/2024)  Transportation Needs: No Transportation Needs (06/08/2024)  Utilities: Not At Risk (06/08/2024)  Alcohol Screen: Low Risk  (01/26/2024)  Depression (PHQ2-9): Low Risk  (06/08/2024)  Financial Resource Strain: Low Risk  (01/26/2024)  Physical Activity: Insufficiently Active (06/08/2024)  Social Connections: Moderately Isolated (06/08/2024)  Stress: No Stress Concern Present (06/08/2024)  Tobacco Use: High Risk (06/08/2024)  Health Literacy: Adequate Health Literacy (06/08/2024)   See flowsheets for full screening details  Depression Screen PHQ 2 & 9 Depression Scale- Over the past 2 weeks, how often have you been bothered by any of the following problems? Little interest or pleasure in doing things: 0 Feeling down, depressed, or hopeless (PHQ Adolescent also includes...irritable): 0 PHQ-2 Total Score: 0 Trouble falling or staying asleep, or sleeping too much: 1 (gets 5-6hrs of sleep) Feeling tired or having little energy: 0 Poor appetite or overeating (PHQ Adolescent also includes...weight loss): 0 Feeling bad about yourself - or that you are a failure or have let  yourself or your family down: 0 Trouble concentrating on things, such as reading the newspaper or watching television (PHQ Adolescent also includes...like school work): 0 Moving or speaking so slowly that other people could have noticed. Or the opposite - being so fidgety or restless that you have been moving around a lot more than usual: 0 Thoughts that you would be better off dead, or of hurting yourself in some way: 0 PHQ-9 Total Score: 1 If you checked off any problems, how difficult have these problems made it for you to do your work, take care of things at home, or get along with other people?: Not difficult at all  Depression Treatment Depression Interventions/Treatment : [id number] Score <4 Follow-up Not Indicated     Goals Addressed               This Visit's Progress     Patient Stated (pt-stated)        Patient stated she plans to continue managing weight and participating in a weight program and A1C level             Objective:    Today's Vitals   06/08/24 1133  Weight: 264 lb (119.7 kg)  Height: 5' 1 (1.549 m)   Body mass index is 49.88 kg/m.  Hearing/Vision screen Hearing Screening - Comments:: Wears hearing aids Vision Screening - Comments:: Wears rx glasses - plans to schedule an appt with My Phebe Daniels for Diabetic eye exam Immunizations and Health Maintenance Health Maintenance  Topic Date Due   COVID-19 Vaccine (1) Never done   Zoster Vaccines- Shingrix (1 of 2) Never done   Cervical Cancer Screening (HPV/Pap Cotest)  02/29/2012   Influenza Vaccine  01/29/2024   OPHTHALMOLOGY EXAM  05/24/2024   Hepatitis B Vaccines 19-59 Average Risk (1 of 3 - 19+ 3-dose series) 01/25/2025 (Originally 12/12/1984)   HEMOGLOBIN A1C  11/09/2024   Diabetic kidney evaluation - eGFR measurement  12/29/2024   Mammogram  03/15/2025   Diabetic kidney evaluation - Urine ACR  05/12/2025   FOOT EXAM  05/12/2025   Medicare Annual Wellness (AWV)  06/08/2025   DTaP/Tdap/Td (3 - Td  or Tdap) 01/25/2029  Colonoscopy  12/20/2033   Pneumococcal Vaccine: 50+ Years  Completed   Hepatitis C Screening  Completed   HIV Screening  Completed   HPV VACCINES  Aged Out   Meningococcal B Vaccine  Aged Out        Assessment/Plan:  This is a routine wellness examination for Autumn Massey.  Patient Care Team: Norleen Lynwood ORN, MD as PCP - Diedre Daniels, [facility] [place], [place] as Referring Physician (Optometry)  I have personally reviewed and noted the following in the patients chart:   Medical and social history Use of alcohol, tobacco or illicit drugs  Current medications and supplements including opioid prescriptions. Functional ability and status Nutritional status Physical activity Advanced directives List of other physicians Hospitalizations, surgeries, and ER visits in previous 12 months Vitals Screenings to include cognitive, depression, and falls Referrals and appointments  No orders of the defined types were placed in this encounter.  In addition, I have reviewed and discussed with patient certain preventive protocols, quality metrics, and best practice recommendations. A written personalized care plan for preventive services as well as general preventive health recommendations were provided to patient.   Verdie CHRISTELLA Saba, CMA   06/08/2024   Return in 1 year (on 06/08/2025).  After Visit Summary: (MyChart) Due to this being a telephonic visit, the after visit summary with patients personalized plan was offered to patient via MyChart   Nurse Notes: Appointment(s) made: (scheduled 2026 AWV appt; Pt plans to schedule an appt w/Gyn Provider for pap smear and Diabetic eye exam w/Optometrist)

## 2024-06-08 NOTE — Patient Instructions (Addendum)
 Autumn Massey,  Thank you for taking the time for your Medicare Wellness Visit. I appreciate your continued commitment to your health goals. Please review the care plan we discussed, and feel free to reach out if I can assist you further.  Please note that Annual Wellness Visits do not include a physical exam. Some assessments may be limited, especially if the visit was conducted virtually. If needed, we may recommend an in-person follow-up with your provider.  Ongoing Care Seeing your primary care provider every 3 to 6 months helps us  monitor your health and provide consistent, personalized care.   Referrals If a referral was made during today's visit and you haven't received any updates within two weeks, please contact the referred provider directly to check on the status.  Recommended Screenings:  Health Maintenance  Topic Date Due   COVID-19 Vaccine (1) Never done   Zoster (Shingles) Vaccine (1 of 2) Never done   Pap with HPV screening  02/29/2012   Flu Shot  01/29/2024   Eye exam for diabetics  05/24/2024   Hepatitis B Vaccine (1 of 3 - 19+ 3-dose series) 01/25/2025*   Hemoglobin A1C  11/09/2024   Yearly kidney function blood test for diabetes  12/29/2024   Breast Cancer Screening  03/15/2025   Yearly kidney health urinalysis for diabetes  05/12/2025   Complete foot exam   05/12/2025   Medicare Annual Wellness Visit  06/08/2025   DTaP/Tdap/Td vaccine (3 - Td or Tdap) 01/25/2029   Colon Cancer Screening  12/20/2033   Pneumococcal Vaccine for age over 79  Completed   Hepatitis C Screening  Completed   HIV Screening  Completed   HPV Vaccine  Aged Out   Meningitis B Vaccine  Aged Out  *Topic was postponed. The date shown is not the original due date.       06/08/2024   11:35 AM  Advanced Directives  Does Patient Have a Medical Advance Directive? No  Would patient like information on creating a medical advance directive? No - Patient declined    Vision: Annual vision  screenings are recommended for early detection of glaucoma, cataracts, and diabetic retinopathy. These exams can also reveal signs of chronic conditions such as diabetes and high blood pressure.  Dental: Annual dental screenings help detect early signs of oral cancer, gum disease, and other conditions linked to overall health, including heart disease and diabetes.

## 2024-06-22 ENCOUNTER — Ambulatory Visit (INDEPENDENT_AMBULATORY_CARE_PROVIDER_SITE_OTHER): Admitting: Adult Health

## 2024-06-27 ENCOUNTER — Other Ambulatory Visit: Payer: Self-pay | Admitting: Internal Medicine

## 2024-06-28 ENCOUNTER — Other Ambulatory Visit: Payer: Self-pay

## 2024-07-20 ENCOUNTER — Ambulatory Visit (INDEPENDENT_AMBULATORY_CARE_PROVIDER_SITE_OTHER): Admitting: Family Medicine

## 2024-07-20 DIAGNOSIS — Z7985 Long-term (current) use of injectable non-insulin antidiabetic drugs: Secondary | ICD-10-CM | POA: Diagnosis not present

## 2024-07-20 DIAGNOSIS — E1169 Type 2 diabetes mellitus with other specified complication: Secondary | ICD-10-CM

## 2024-07-20 DIAGNOSIS — E669 Obesity, unspecified: Secondary | ICD-10-CM

## 2024-07-20 DIAGNOSIS — R809 Proteinuria, unspecified: Secondary | ICD-10-CM

## 2024-07-20 DIAGNOSIS — E1159 Type 2 diabetes mellitus with other circulatory complications: Secondary | ICD-10-CM

## 2024-07-20 DIAGNOSIS — I10 Essential (primary) hypertension: Secondary | ICD-10-CM

## 2024-07-20 DIAGNOSIS — E1129 Type 2 diabetes mellitus with other diabetic kidney complication: Secondary | ICD-10-CM

## 2024-07-20 DIAGNOSIS — E559 Vitamin D deficiency, unspecified: Secondary | ICD-10-CM

## 2024-07-20 DIAGNOSIS — Z7984 Long term (current) use of oral hypoglycemic drugs: Secondary | ICD-10-CM | POA: Diagnosis not present

## 2024-07-20 DIAGNOSIS — E785 Hyperlipidemia, unspecified: Secondary | ICD-10-CM

## 2024-07-20 DIAGNOSIS — Z6841 Body Mass Index (BMI) 40.0 and over, adult: Secondary | ICD-10-CM | POA: Diagnosis not present

## 2024-07-20 DIAGNOSIS — I152 Hypertension secondary to endocrine disorders: Secondary | ICD-10-CM

## 2024-07-20 NOTE — Progress Notes (Signed)
 "  Barnie DOROTHA Jenkins, D.O.  ABFM, ABOM Specializing in Clinical Bariatric Medicine  Office located at: [address] [place], [place]  [address]         Medications Discontinued During This Encounter  Medication Reason   phenazopyridine  (PYRIDIUM ) 100 MG tablet Patient Preference     Morbid obesity (HCC): Starting BMI 54.4 BMI 50.0-59.9, adult (HCC)-current BMI 49.34  FOR THE CHRONIC DISEASE OF OBESITY:  Chief complaint: Obesity Autumn Massey is here to discuss her progress with her obesity treatment plan.   History of present illness / Interval history:  Autumn Massey is here today for her follow-up office visit.  Since last OV on 04/27/2024 with provider: Dr. Jenkins, patient is down 8 lbs.   had recent lapse due to illness; had a stomach virus recently  Pt has been struggling with:  []  meal planning and prepping []  exercise [x]  sleep []  stressors []  mood []  chronic or acute medical conditions-  []  eating out more []  Nothing, doing great  Pt has been working on and improving their:  []  meal planning and prepping []  exercise []  sleep hygiene []  water intake []  strategies to better manage personal stressors []  strategies to better manage mood []  eating out less [x]  Nothing particular at this time  When asked by CMA prior to our office visit today, pt states they have been:  - Focused on eating fresh, unprocessed foods?  Yes   - Focused on eating lean proteins with each meal?  Yes   - Sleeping 7-9 Hours/ Night?  No - Skipping Meals? Yes - States she is following her healthy eating plan approximately 60 % of the time.   Recent weight loss data history  04/27/24 15:00 07/20/24 09:00   Body Fat % 56.8 % 55.6 %  Muscle Mass (lbs) 110.6 lbs 110.4 lbs  Fat Mass (lbs) 153 lbs 145.4 lbs  Visceral Fat Rating  22 21    Physician directed Nutrition Therapy prescription: She is keeping a food journal and adhering to recommended goals of 1500-1700 calories and  110+ grams protein.    Autumn Massey is currently in the action stage of change. As such, her goal is to continue journaling food intake  She has agreed to: continue current reduced-calorie meal plan    Physician directed Behavioral Modification prescription: Evidence-based interventions for healthy behavior change were utilized today including the discussion of small changes and SMART goals.  We discussed the following today: increasing lean protein intake to established goals, avoiding skipping meals, increasing water intake , work on meal planning and preparation, continue to work on implementation of reduced calorie nutritional plan, and continue to practice mindfulness when eating  Additional resources provided: None   Physician directed Physical Activity prescription: Pt is doing chair yoga, walking and doing hand weights 15-20  minutes 3 days per week  Ardine has been educated on importance of strength training to enhance/ sustain muscle mass along with prudent nutrition and how exercise will help regulate blood sugar levels and prevent excess fat storage  Exercise prescription: She has agreed to Continue current level of physical activity , Increase the intensity, frequency or duration of aerobic exercises  , and Combine aerobic and strengthening exercises for efficiency and improved cardiometabolic health.    Medical Interventions/ Pharmacotherapy Previous Bariatric surgery: none   Pharmacotherapy for weight loss: She is currently taking Mounjaro  and Metformin  for medical weight loss.     We discussed various medication options to help Autumn Massey with her  weight loss efforts and we both agreed to:  Continue with current nutritional and behavioral strategies  SPECIFIC behavorial / nutritional / exercise goals for next office visit:  none  B) OBESITY RELATED CONDITIONS ADDRESSED TODAY:   Type 2 diabetes mellitus in patient with obesity [facility]) Assessment & Plan Lab Results   Component Value Date   HGBA1C 6.7 (A) 05/12/2024   HGBA1C 8.2 (A) 01/12/2024   HGBA1C 7.3 (A) 07/09/2023   INSULIN  7.5 03/11/2022   Lab Results  Component Value Date   TSH 0.52 05/12/2024   T3TOTAL 68 (L) 09/19/2020   FREET4 1.5 05/12/2024  Managed by Dr.Gherghe, endocrinologist. On Jardiance , Metformin , and Mounjaro  with good compliance.  Good control of hunger and cravings. Reports slight side effects 48 hours after she takes the shot, Dr. Vianne decided to keep dose the same and I concur with that.  Recent laboratory results obtained on 05/12/2024 from Endo were reviewed in detail with pt per their request today. A1c has improved from 8.2 to 6.7; optimal <7. Thyroid  levels are wnl.  Importance of f/up with PCP and all other specialists, as scheduled, was stressed to the patient today. Cont meds per Endo. Decrease simple carbs, increase lean protein, and increase exercise to improve glycemic control and promote weight loss.     Hypertension associated with diabetes (HCC) Assessment & Plan Last 3 blood pressure readings in our office are as follows: BP Readings from Last 3 Encounters:  07/20/24 133/78  05/12/24 110/62  04/27/24 135/81   The ASCVD Risk score (Arnett DK, et al., 2019) failed to calculate for the following reasons:   The valid total cholesterol range is 130 to 320 mg/dL  Lab Results  Component Value Date   CREATININE 0.57 12/30/2023  Currently on lasix  and spironolactone  with good compliance and tolerance. BP is not at goal today at 133/78; optimal <120/80. Pt asx. No acute concerns. Cont meds per PCP. F/u with PCP as needed. Cont heart-healthy, low-sodium diet. Increase exercise as able.    Microalbuminuria due to type 2 diabetes mellitus [facility]) Assessment & Plan Lab Results  Component Value Date   CREATININE 0.57 12/30/2023   BUN 10 12/30/2023   NA 136 12/30/2023   K 3.4 (L) 12/30/2023   CL 100 12/30/2023   CO2 26 12/30/2023   Lab Results  Component  Value Date   MICROALBUR 17.4 05/12/2024   MICROALBUR 7.8 01/12/2024  Recent laboratory results obtained on from Endo were reviewed in detail with pt per their request today. Microalbumin creatinine ratio is elevated at 268. Optimal <30. 4 years ago, Microalbumin creatinine ratio was 31 and since then has worsened. Reminded pt to f/u with PCP about condition. Cont adequate hydration and adherence to MP. Avoid advil  and alleve to protect kidneys from damage.    Vitamin D  deficiency Assessment & Plan Lab Results  Component Value Date   VD25OH 57 05/12/2024   VD25OH 47.22 01/26/2023   VD25OH 83.22 09/11/2022  Currently on OTC Vit  D with good compliance and tolerance. Most recent labs show Vit D levels are at goal. No acute concerns. Cont regimen. Will recheck as necessary.    Hyperlipidemia associated with type 2 diabetes mellitus [facility]) Assessment & Plan Lab Results  Component Value Date   CHOL 125 05/12/2024   HDL 34 (L) 05/12/2024   LDLCALC 60 05/12/2024   LDLDIRECT 79.0 01/26/2023   TRIG 296 (H) 05/12/2024   CHOLHDL 3.7 05/12/2024  Currently on Crestor  once daily with good compliance and  tolerance. HDL is below goal at 34; optimal >65. LDL is WNL. Trigs are elevated at 296; optimal <150. I stressed the importance of decreasing saturated/ trans fats and simple sugars. Focus on whole foods and lean proteins. Increase exercise as able. Encouraged pt to f/u with PCP and ask how to better control elevated trig levels. Cont meds per PCP.    Follow up:   Return 10/05/2024 11:00 AM.   She was informed of the importance of frequent follow up visits to maximize her success with intensive lifestyle modifications for her multiple health conditions.   Weight Summary and Biometrics   Weight Lost Since Last Visit: 8  Weight Gained Since Last Visit: 0    Vitals Temp: 98.6 F (37 C) BP: 133/78 Pulse Rate: 82 SpO2: 99 %   Anthropometric Measurements Height: 5' 1 (1.549 m) Weight:  261 lb (118.4 kg) BMI (Calculated): 49.34 Weight at Last Visit: 269 lb Weight Lost Since Last Visit: 8 Weight Gained Since Last Visit: 0 Starting Weight: 288 lb Total Weight Loss (lbs): 27 lb (12.2 kg)   Body Composition  Body Fat %: 55.6 % Fat Mass (lbs): 145.4 lbs Muscle Mass (lbs): 110.4 lbs Visceral Fat Rating : 21   Other Clinical Data Today's Visit #: 26 Starting Date: 03/11/22    Objective:   PHYSICAL EXAM: Blood pressure 133/78, pulse 82, temperature 98.6 F (37 C), height 5' 1 (1.549 m), weight 261 lb (118.4 kg), SpO2 99%. Body mass index is 49.32 kg/m. General: she is overweight, cooperative and in no acute distress. PSYCH: Has normal mood, affect and thought process.   HEENT: EOMI, sclerae are anicteric. Lungs: Normal breathing effort, no conversational dyspnea. Extremities: Moves * 4 Neurologic: A and O * 3, good insight  DIAGNOSTIC DATA REVIEWED: BMET    Component Value Date/Time   NA 136 12/30/2023 1716   NA 137 03/11/2022 1554   K 3.4 (L) 12/30/2023 1716   CL 100 12/30/2023 1716   CO2 26 12/30/2023 1716   GLUCOSE 103 (H) 12/30/2023 1716   BUN 10 12/30/2023 1716   BUN 11 03/11/2022 1554   CREATININE 0.57 12/30/2023 1716   CREATININE 0.73 03/02/2018 1157   CALCIUM  8.8 (L) 12/30/2023 1716   GFRNONAA >60 12/30/2023 1716   GFRNONAA 95 03/02/2018 1157   GFRAA >60 04/26/2019 1445   GFRAA 110 03/02/2018 1157   Lab Results  Component Value Date   HGBA1C 6.7 (A) 05/12/2024   HGBA1C 5.6 03/15/2012   Lab Results  Component Value Date   INSULIN  7.5 03/11/2022   Lab Results  Component Value Date   TSH 0.52 05/12/2024   CBC    Component Value Date/Time   WBC 8.3 12/30/2023 1716   RBC 5.00 12/30/2023 1716   HGB 15.5 (H) 12/30/2023 1716   HGB 19.6 (H) 03/11/2022 1554   HCT 47.8 (H) 12/30/2023 1716   HCT 57.0 (H) 03/11/2022 1554   PLT 149 (L) 12/30/2023 1716   PLT 167 03/11/2022 1554   MCV 95.6 12/30/2023 1716   MCV 92 03/11/2022 1554    MCH 31.0 12/30/2023 1716   MCHC 32.4 12/30/2023 1716   RDW 13.7 12/30/2023 1716   RDW 12.1 03/11/2022 1554   Iron Studies    Component Value Date/Time   IRON 93 01/26/2019 0959   FERRITIN 124.5 01/29/2009 0905   IRONPCTSAT 31.3 01/26/2019 0959   Lipid Panel     Component Value Date/Time   CHOL 125 05/12/2024 1555   CHOL 130 03/11/2022 1554  TRIG 296 (H) 05/12/2024 1555   HDL 34 (L) 05/12/2024 1555   HDL 34 (L) 03/11/2022 1554   CHOLHDL 3.7 05/12/2024 1555   VLDL 56.2 (H) 01/26/2023 1503   LDLCALC 60 05/12/2024 1555   LDLDIRECT 79.0 01/26/2023 1503   Hepatic Function Panel     Component Value Date/Time   PROT 6.7 12/30/2023 1716   PROT 6.9 03/11/2022 1554   ALBUMIN  3.3 (L) 12/30/2023 1716   ALBUMIN  4.3 03/11/2022 1554   AST 18 12/30/2023 1716   ALT 12 12/30/2023 1716   ALKPHOS 58 12/30/2023 1716   BILITOT 0.6 12/30/2023 1716   BILITOT 0.7 03/11/2022 1554   BILIDIR 0.1 01/26/2023 1503   IBILI 0.4 01/27/2020 1006      Component Value Date/Time   TSH 0.52 05/12/2024 1555   Nutritional Lab Results  Component Value Date   VD25OH 57 05/12/2024   VD25OH 47.22 01/26/2023   VD25OH 83.22 09/11/2022    Attestations:   LILLETTE Feliciano Mingle, acting as a stage manager for Barnie Jenkins, DO., have compiled all relevant documentation for today's office visit on behalf of Barnie Jenkins, DO, while in the presence of [employer], DO.   I have reviewed the above documentation for accuracy and completeness, and I agree with the above. Barnie JINNY Jenkins, D.O.  The 21st Century Cures Act was signed into law in 2016 which includes the topic of electronic health records.  This provides immediate access to information in MyChart.  This includes consultation notes, operative notes, office notes, lab results and pathology reports.  If you have any questions about what you read please let us  know at your next visit so we can discuss your concerns and take corrective action if need  be.  We are right here with you.  "

## 2024-07-26 ENCOUNTER — Ambulatory Visit: Admitting: Internal Medicine

## 2024-09-07 ENCOUNTER — Ambulatory Visit: Admitting: Internal Medicine

## 2024-09-13 ENCOUNTER — Ambulatory Visit: Admitting: Neurology

## 2024-10-05 ENCOUNTER — Ambulatory Visit (INDEPENDENT_AMBULATORY_CARE_PROVIDER_SITE_OTHER): Admitting: Family Medicine

## 2025-06-12 ENCOUNTER — Ambulatory Visit
